# Patient Record
Sex: Male | Born: 1955
Health system: Southern US, Community
[De-identification: ages and names within clinical notes are randomized; demographics above are authoritative.]

## PROBLEM LIST (undated history)

## (undated) DIAGNOSIS — F3289 Other specified depressive episodes: Secondary | ICD-10-CM

## (undated) DIAGNOSIS — E119 Type 2 diabetes mellitus without complications: Secondary | ICD-10-CM

## (undated) DIAGNOSIS — E785 Hyperlipidemia, unspecified: Secondary | ICD-10-CM

## (undated) DIAGNOSIS — G7000 Myasthenia gravis without (acute) exacerbation: Secondary | ICD-10-CM

## (undated) DIAGNOSIS — K635 Polyp of colon: Secondary | ICD-10-CM

## (undated) DIAGNOSIS — G4700 Insomnia, unspecified: Secondary | ICD-10-CM

## (undated) DIAGNOSIS — D609 Acquired pure red cell aplasia, unspecified: Secondary | ICD-10-CM

## (undated) DIAGNOSIS — F329 Major depressive disorder, single episode, unspecified: Secondary | ICD-10-CM

## (undated) DIAGNOSIS — F4312 Post-traumatic stress disorder, chronic: Secondary | ICD-10-CM

## (undated) DIAGNOSIS — B181 Chronic viral hepatitis B without delta-agent: Secondary | ICD-10-CM

## (undated) DIAGNOSIS — K644 Residual hemorrhoidal skin tags: Secondary | ICD-10-CM

## (undated) DIAGNOSIS — L308 Other specified dermatitis: Secondary | ICD-10-CM

## (undated) DIAGNOSIS — K648 Other hemorrhoids: Secondary | ICD-10-CM

## (undated) DIAGNOSIS — F419 Anxiety disorder, unspecified: Secondary | ICD-10-CM

## (undated) HISTORY — DX: Hyperlipidemia, unspecified: E78.5

## (undated) HISTORY — DX: Type 2 diabetes mellitus without complications: E11.9

## (undated) HISTORY — DX: Acquired pure red cell aplasia, unspecified: D60.9

## (undated) HISTORY — DX: Anxiety disorder, unspecified: F41.9

## (undated) HISTORY — DX: Insomnia, unspecified: G47.00

## (undated) HISTORY — DX: Chronic viral hepatitis B without delta-agent: B18.1

## (undated) HISTORY — DX: Myasthenia gravis without (acute) exacerbation: G70.00

## (undated) HISTORY — DX: Other specified depressive episodes: F32.89

## (undated) HISTORY — DX: Other hemorrhoids: K64.8

## (undated) HISTORY — PX: THYMECTOMY: SHX1063

## (undated) HISTORY — DX: Polyp of colon: K63.5

## (undated) HISTORY — DX: Major depressive disorder, single episode, unspecified: F32.9

## (undated) HISTORY — DX: Residual hemorrhoidal skin tags: K64.4

---

## 1999-06-06 ENCOUNTER — Ambulatory Visit (HOSPITAL_COMMUNITY): Admission: EM | Admit: 1999-06-06 | Discharge: 1999-06-06 | Payer: Self-pay | Admitting: Emergency Medicine

## 2001-07-23 ENCOUNTER — Ambulatory Visit (HOSPITAL_COMMUNITY): Admission: RE | Admit: 2001-07-23 | Discharge: 2001-07-23 | Payer: Self-pay | Admitting: Endocrinology

## 2001-07-23 ENCOUNTER — Encounter: Payer: Self-pay | Admitting: Endocrinology

## 2001-10-29 ENCOUNTER — Ambulatory Visit (HOSPITAL_COMMUNITY): Admission: RE | Admit: 2001-10-29 | Discharge: 2001-10-29 | Payer: Self-pay | Admitting: Endocrinology

## 2001-10-29 ENCOUNTER — Encounter: Payer: Self-pay | Admitting: Endocrinology

## 2001-11-12 ENCOUNTER — Encounter (HOSPITAL_COMMUNITY): Admission: RE | Admit: 2001-11-12 | Discharge: 2001-12-29 | Payer: Self-pay | Admitting: Neurology

## 2001-11-15 ENCOUNTER — Encounter: Payer: Self-pay | Admitting: Neurology

## 2001-11-15 ENCOUNTER — Ambulatory Visit (HOSPITAL_COMMUNITY): Admission: RE | Admit: 2001-11-15 | Discharge: 2001-11-15 | Payer: Self-pay | Admitting: Neurology

## 2001-12-06 ENCOUNTER — Encounter: Payer: Self-pay | Admitting: Thoracic Surgery (Cardiothoracic Vascular Surgery)

## 2001-12-10 ENCOUNTER — Inpatient Hospital Stay (HOSPITAL_COMMUNITY)
Admission: RE | Admit: 2001-12-10 | Discharge: 2001-12-13 | Payer: Self-pay | Admitting: Thoracic Surgery (Cardiothoracic Vascular Surgery)

## 2001-12-10 ENCOUNTER — Encounter (INDEPENDENT_AMBULATORY_CARE_PROVIDER_SITE_OTHER): Payer: Self-pay | Admitting: Specialist

## 2001-12-11 ENCOUNTER — Encounter: Payer: Self-pay | Admitting: Thoracic Surgery (Cardiothoracic Vascular Surgery)

## 2001-12-12 ENCOUNTER — Encounter: Payer: Self-pay | Admitting: Thoracic Surgery (Cardiothoracic Vascular Surgery)

## 2001-12-17 ENCOUNTER — Ambulatory Visit: Admission: RE | Admit: 2001-12-17 | Discharge: 2002-02-28 | Payer: Self-pay | Admitting: Radiation Oncology

## 2002-01-01 ENCOUNTER — Encounter
Admission: RE | Admit: 2002-01-01 | Discharge: 2002-01-01 | Payer: Self-pay | Admitting: Thoracic Surgery (Cardiothoracic Vascular Surgery)

## 2002-01-01 ENCOUNTER — Encounter: Payer: Self-pay | Admitting: Thoracic Surgery (Cardiothoracic Vascular Surgery)

## 2002-01-30 HISTORY — PX: THYMECTOMY: SHX1063

## 2002-03-31 ENCOUNTER — Encounter
Admission: RE | Admit: 2002-03-31 | Discharge: 2002-03-31 | Payer: Self-pay | Admitting: Thoracic Surgery (Cardiothoracic Vascular Surgery)

## 2002-03-31 ENCOUNTER — Encounter: Payer: Self-pay | Admitting: Thoracic Surgery (Cardiothoracic Vascular Surgery)

## 2002-04-14 ENCOUNTER — Inpatient Hospital Stay (HOSPITAL_COMMUNITY): Admission: EM | Admit: 2002-04-14 | Discharge: 2002-04-15 | Payer: Self-pay | Admitting: Endocrinology

## 2002-06-03 ENCOUNTER — Encounter: Payer: Self-pay | Admitting: Thoracic Surgery (Cardiothoracic Vascular Surgery)

## 2002-06-03 ENCOUNTER — Encounter
Admission: RE | Admit: 2002-06-03 | Discharge: 2002-06-03 | Payer: Self-pay | Admitting: Thoracic Surgery (Cardiothoracic Vascular Surgery)

## 2002-09-08 ENCOUNTER — Encounter: Payer: Self-pay | Admitting: Thoracic Surgery (Cardiothoracic Vascular Surgery)

## 2002-09-08 ENCOUNTER — Encounter
Admission: RE | Admit: 2002-09-08 | Discharge: 2002-09-08 | Payer: Self-pay | Admitting: Thoracic Surgery (Cardiothoracic Vascular Surgery)

## 2003-03-17 ENCOUNTER — Encounter
Admission: RE | Admit: 2003-03-17 | Discharge: 2003-03-17 | Payer: Self-pay | Admitting: Thoracic Surgery (Cardiothoracic Vascular Surgery)

## 2003-03-26 ENCOUNTER — Ambulatory Visit: Admission: RE | Admit: 2003-03-26 | Discharge: 2003-03-26 | Payer: Self-pay | Admitting: Radiation Oncology

## 2003-09-16 ENCOUNTER — Encounter
Admission: RE | Admit: 2003-09-16 | Discharge: 2003-09-16 | Payer: Self-pay | Admitting: Thoracic Surgery (Cardiothoracic Vascular Surgery)

## 2004-02-18 ENCOUNTER — Ambulatory Visit: Payer: Self-pay | Admitting: Endocrinology

## 2004-04-07 ENCOUNTER — Ambulatory Visit: Admission: RE | Admit: 2004-04-07 | Discharge: 2004-04-07 | Payer: Self-pay | Admitting: Radiation Oncology

## 2004-04-21 ENCOUNTER — Ambulatory Visit: Admission: RE | Admit: 2004-04-21 | Discharge: 2004-04-21 | Payer: Self-pay | Admitting: Radiation Oncology

## 2004-06-23 ENCOUNTER — Ambulatory Visit: Payer: Self-pay | Admitting: Endocrinology

## 2004-06-29 ENCOUNTER — Ambulatory Visit: Payer: Self-pay | Admitting: Endocrinology

## 2004-07-27 ENCOUNTER — Ambulatory Visit: Payer: Self-pay | Admitting: Endocrinology

## 2004-10-13 ENCOUNTER — Encounter
Admission: RE | Admit: 2004-10-13 | Discharge: 2004-10-13 | Payer: Self-pay | Admitting: Thoracic Surgery (Cardiothoracic Vascular Surgery)

## 2004-10-20 ENCOUNTER — Ambulatory Visit: Payer: Self-pay | Admitting: Endocrinology

## 2004-10-27 ENCOUNTER — Ambulatory Visit: Payer: Self-pay | Admitting: Endocrinology

## 2005-01-06 ENCOUNTER — Ambulatory Visit: Payer: Self-pay | Admitting: Endocrinology

## 2005-02-02 ENCOUNTER — Ambulatory Visit: Payer: Self-pay | Admitting: Endocrinology

## 2005-03-06 ENCOUNTER — Ambulatory Visit: Payer: Self-pay | Admitting: Endocrinology

## 2005-03-20 ENCOUNTER — Ambulatory Visit: Payer: Self-pay | Admitting: Internal Medicine

## 2005-03-27 ENCOUNTER — Ambulatory Visit: Payer: Self-pay | Admitting: Endocrinology

## 2005-03-27 ENCOUNTER — Inpatient Hospital Stay (HOSPITAL_COMMUNITY): Admission: AD | Admit: 2005-03-27 | Discharge: 2005-04-01 | Payer: Self-pay | Admitting: Endocrinology

## 2005-03-28 ENCOUNTER — Ambulatory Visit: Payer: Self-pay | Admitting: Internal Medicine

## 2005-04-04 ENCOUNTER — Ambulatory Visit: Payer: Self-pay | Admitting: Endocrinology

## 2005-04-25 ENCOUNTER — Ambulatory Visit: Payer: Self-pay | Admitting: Endocrinology

## 2005-05-02 ENCOUNTER — Ambulatory Visit: Payer: Self-pay | Admitting: Endocrinology

## 2005-05-11 ENCOUNTER — Ambulatory Visit: Payer: Self-pay | Admitting: Cardiology

## 2005-06-13 ENCOUNTER — Ambulatory Visit: Payer: Self-pay | Admitting: Endocrinology

## 2005-07-12 ENCOUNTER — Ambulatory Visit: Payer: Self-pay | Admitting: Endocrinology

## 2005-07-26 ENCOUNTER — Ambulatory Visit: Payer: Self-pay | Admitting: Endocrinology

## 2005-08-30 ENCOUNTER — Ambulatory Visit: Payer: Self-pay | Admitting: Endocrinology

## 2005-10-17 ENCOUNTER — Ambulatory Visit: Payer: Self-pay | Admitting: Endocrinology

## 2005-10-26 ENCOUNTER — Ambulatory Visit: Payer: Self-pay | Admitting: Endocrinology

## 2005-11-21 ENCOUNTER — Encounter
Admission: RE | Admit: 2005-11-21 | Discharge: 2005-11-21 | Payer: Self-pay | Admitting: Thoracic Surgery (Cardiothoracic Vascular Surgery)

## 2005-11-23 ENCOUNTER — Ambulatory Visit: Payer: Self-pay | Admitting: Gastroenterology

## 2006-02-09 ENCOUNTER — Ambulatory Visit: Payer: Self-pay | Admitting: Endocrinology

## 2006-02-09 LAB — CONVERTED CEMR LAB: VLDL: 27 mg/dL (ref 0–40)

## 2006-02-13 ENCOUNTER — Ambulatory Visit: Payer: Self-pay | Admitting: Endocrinology

## 2006-02-22 ENCOUNTER — Ambulatory Visit: Payer: Self-pay | Admitting: Gastroenterology

## 2006-03-05 ENCOUNTER — Ambulatory Visit: Payer: Self-pay | Admitting: Endocrinology

## 2006-03-05 LAB — CONVERTED CEMR LAB
Eosinophils Relative: 4.1 % (ref 0.0–5.0)
HCT: 49.4 % (ref 39.0–52.0)
Hemoglobin: 17.2 g/dL — ABNORMAL HIGH (ref 13.0–17.0)
Lymphocytes Relative: 17.3 % (ref 12.0–46.0)
MCHC: 34.9 g/dL (ref 30.0–36.0)
MCV: 88.8 fL (ref 78.0–100.0)
Neutro Abs: 3.5 10*3/uL (ref 1.4–7.7)
Neutrophils Relative %: 69.3 % (ref 43.0–77.0)
Platelets: 164 10*3/uL (ref 150–400)
RBC: 5.57 M/uL (ref 4.22–5.81)
RDW: 12.5 % (ref 11.5–14.6)
WBC: 5 10*3/uL (ref 4.5–10.5)

## 2006-04-30 ENCOUNTER — Ambulatory Visit: Payer: Self-pay | Admitting: Cardiovascular Disease

## 2006-04-30 ENCOUNTER — Ambulatory Visit: Payer: Self-pay | Admitting: Endocrinology

## 2006-05-04 ENCOUNTER — Ambulatory Visit: Payer: Self-pay | Admitting: Endocrinology

## 2006-05-04 LAB — CONVERTED CEMR LAB
Alkaline Phosphatase: 51 units/L (ref 39–117)
Bilirubin, Direct: 0.2 mg/dL (ref 0.0–0.3)

## 2006-05-09 ENCOUNTER — Ambulatory Visit: Payer: Self-pay | Admitting: Internal Medicine

## 2006-05-09 LAB — CONVERTED CEMR LAB
HCV Ab: NEGATIVE
Hep A IgM: NEGATIVE
Hep B C IgM: NEGATIVE

## 2006-05-29 ENCOUNTER — Ambulatory Visit: Payer: Self-pay | Admitting: Endocrinology

## 2006-06-29 ENCOUNTER — Encounter: Payer: Self-pay | Admitting: Endocrinology

## 2006-06-29 DIAGNOSIS — B181 Chronic viral hepatitis B without delta-agent: Secondary | ICD-10-CM | POA: Insufficient documentation

## 2006-06-29 DIAGNOSIS — F339 Major depressive disorder, recurrent, unspecified: Secondary | ICD-10-CM | POA: Insufficient documentation

## 2006-06-29 DIAGNOSIS — E785 Hyperlipidemia, unspecified: Secondary | ICD-10-CM | POA: Insufficient documentation

## 2006-06-29 DIAGNOSIS — D609 Acquired pure red cell aplasia, unspecified: Secondary | ICD-10-CM | POA: Insufficient documentation

## 2006-06-29 DIAGNOSIS — E1169 Type 2 diabetes mellitus with other specified complication: Secondary | ICD-10-CM | POA: Insufficient documentation

## 2006-06-29 DIAGNOSIS — G4700 Insomnia, unspecified: Secondary | ICD-10-CM | POA: Insufficient documentation

## 2006-06-29 DIAGNOSIS — G7000 Myasthenia gravis without (acute) exacerbation: Secondary | ICD-10-CM | POA: Insufficient documentation

## 2006-06-29 DIAGNOSIS — E119 Type 2 diabetes mellitus without complications: Secondary | ICD-10-CM | POA: Insufficient documentation

## 2006-06-29 DIAGNOSIS — F324 Major depressive disorder, single episode, in partial remission: Secondary | ICD-10-CM | POA: Insufficient documentation

## 2006-07-03 ENCOUNTER — Ambulatory Visit: Payer: Self-pay | Admitting: Gastroenterology

## 2006-07-07 ENCOUNTER — Ambulatory Visit (HOSPITAL_COMMUNITY): Admission: RE | Admit: 2006-07-07 | Discharge: 2006-07-07 | Payer: Self-pay | Admitting: Gastroenterology

## 2006-07-25 ENCOUNTER — Ambulatory Visit: Payer: Self-pay | Admitting: Endocrinology

## 2006-09-24 ENCOUNTER — Ambulatory Visit: Payer: Self-pay | Admitting: Endocrinology

## 2006-09-24 LAB — CONVERTED CEMR LAB
Cholesterol: 190 mg/dL (ref 0–200)
HDL: 48.2 mg/dL (ref >39.0)
TSH: 0.86 microintl units/mL (ref 0.35–5.50)
Triglycerides: 233 mg/dL (ref 0–149)

## 2006-10-17 ENCOUNTER — Ambulatory Visit: Payer: Self-pay | Admitting: Endocrinology

## 2006-10-18 ENCOUNTER — Ambulatory Visit: Payer: Self-pay | Admitting: Gastroenterology

## 2006-12-14 ENCOUNTER — Ambulatory Visit: Payer: Self-pay | Admitting: Gastroenterology

## 2006-12-24 ENCOUNTER — Ambulatory Visit: Payer: Self-pay | Admitting: Endocrinology

## 2006-12-25 LAB — CONVERTED CEMR LAB: Hgb A1c MFr Bld: 6.3 % — ABNORMAL HIGH (ref 4.6–6.0)

## 2007-01-10 ENCOUNTER — Encounter: Payer: Self-pay | Admitting: Gastroenterology

## 2007-01-10 ENCOUNTER — Ambulatory Visit: Payer: Self-pay | Admitting: Gastroenterology

## 2007-01-17 ENCOUNTER — Telehealth (INDEPENDENT_AMBULATORY_CARE_PROVIDER_SITE_OTHER): Payer: Self-pay | Admitting: *Deleted

## 2007-01-18 ENCOUNTER — Ambulatory Visit: Payer: Self-pay | Admitting: Endocrinology

## 2007-01-27 ENCOUNTER — Emergency Department (HOSPITAL_COMMUNITY): Admission: EM | Admit: 2007-01-27 | Discharge: 2007-01-27 | Payer: Self-pay | Admitting: Emergency Medicine

## 2007-01-28 ENCOUNTER — Telehealth: Payer: Self-pay | Admitting: Endocrinology

## 2007-01-30 ENCOUNTER — Ambulatory Visit: Payer: Self-pay

## 2007-01-30 ENCOUNTER — Encounter: Payer: Self-pay | Admitting: Endocrinology

## 2007-02-06 ENCOUNTER — Encounter: Payer: Self-pay | Admitting: Endocrinology

## 2007-02-15 ENCOUNTER — Ambulatory Visit (HOSPITAL_COMMUNITY): Admission: RE | Admit: 2007-02-15 | Discharge: 2007-02-15 | Payer: Self-pay | Admitting: Gastroenterology

## 2007-03-14 DIAGNOSIS — K648 Other hemorrhoids: Secondary | ICD-10-CM | POA: Insufficient documentation

## 2007-03-14 DIAGNOSIS — K644 Residual hemorrhoidal skin tags: Secondary | ICD-10-CM | POA: Insufficient documentation

## 2007-03-14 DIAGNOSIS — D126 Benign neoplasm of colon, unspecified: Secondary | ICD-10-CM | POA: Insufficient documentation

## 2007-03-14 DIAGNOSIS — F411 Generalized anxiety disorder: Secondary | ICD-10-CM

## 2007-03-14 DIAGNOSIS — F419 Anxiety disorder, unspecified: Secondary | ICD-10-CM | POA: Insufficient documentation

## 2007-03-25 ENCOUNTER — Ambulatory Visit: Payer: Self-pay | Admitting: Endocrinology

## 2007-03-25 DIAGNOSIS — R21 Rash and other nonspecific skin eruption: Secondary | ICD-10-CM

## 2007-03-25 DIAGNOSIS — R000 Tachycardia, unspecified: Secondary | ICD-10-CM | POA: Insufficient documentation

## 2007-03-25 DIAGNOSIS — I498 Other specified cardiac arrhythmias: Secondary | ICD-10-CM

## 2007-03-25 DIAGNOSIS — Z8679 Personal history of other diseases of the circulatory system: Secondary | ICD-10-CM | POA: Insufficient documentation

## 2007-03-25 DIAGNOSIS — L282 Other prurigo: Secondary | ICD-10-CM | POA: Insufficient documentation

## 2007-04-02 ENCOUNTER — Encounter: Payer: Self-pay | Admitting: Endocrinology

## 2007-04-03 ENCOUNTER — Encounter: Payer: Self-pay | Admitting: Endocrinology

## 2007-05-06 ENCOUNTER — Encounter: Payer: Self-pay | Admitting: Endocrinology

## 2007-05-06 ENCOUNTER — Ambulatory Visit: Admission: RE | Admit: 2007-05-06 | Discharge: 2007-05-06 | Payer: Self-pay | Admitting: Radiation Oncology

## 2007-05-06 LAB — CREATININE, SERUM: Creatinine, Ser: 0.92 mg/dL (ref 0.40–1.50)

## 2007-05-08 ENCOUNTER — Ambulatory Visit (HOSPITAL_COMMUNITY): Admission: RE | Admit: 2007-05-08 | Discharge: 2007-05-08 | Payer: Self-pay | Admitting: Radiation Oncology

## 2007-05-20 ENCOUNTER — Ambulatory Visit: Payer: Self-pay | Admitting: Endocrinology

## 2007-06-06 ENCOUNTER — Ambulatory Visit: Payer: Self-pay | Admitting: Gastroenterology

## 2007-06-19 ENCOUNTER — Ambulatory Visit: Payer: Self-pay | Admitting: Endocrinology

## 2007-07-05 ENCOUNTER — Encounter: Payer: Self-pay | Admitting: Endocrinology

## 2007-09-05 ENCOUNTER — Encounter: Payer: Self-pay | Admitting: Endocrinology

## 2007-10-23 ENCOUNTER — Ambulatory Visit: Payer: Self-pay | Admitting: Endocrinology

## 2007-10-23 LAB — CONVERTED CEMR LAB
CO2: 33 meq/L — ABNORMAL HIGH (ref 19–32)
Calcium: 10.3 mg/dL (ref 8.4–10.5)
Chloride: 108 meq/L (ref 96–112)
Creatinine, Ser: 1 mg/dL (ref 0.4–1.5)
GFR calc non Af Amer: 84 mL/min
Microalb Creat Ratio: 6.9 mg/g (ref 0.0–30.0)
Sodium: 147 meq/L — ABNORMAL HIGH (ref 135–145)
TSH: 0.7 microintl units/mL (ref 0.35–5.50)
Total CHOL/HDL Ratio: 4.5
Triglycerides: 412 mg/dL (ref 0–149)

## 2007-10-25 ENCOUNTER — Ambulatory Visit: Payer: Self-pay | Admitting: Internal Medicine

## 2007-10-25 ENCOUNTER — Encounter: Payer: Self-pay | Admitting: Endocrinology

## 2007-10-29 ENCOUNTER — Encounter: Payer: Self-pay | Admitting: Endocrinology

## 2007-11-25 ENCOUNTER — Ambulatory Visit: Payer: Self-pay | Admitting: Endocrinology

## 2007-11-25 DIAGNOSIS — H66009 Acute suppurative otitis media without spontaneous rupture of ear drum, unspecified ear: Secondary | ICD-10-CM | POA: Insufficient documentation

## 2007-11-25 DIAGNOSIS — M810 Age-related osteoporosis without current pathological fracture: Secondary | ICD-10-CM | POA: Insufficient documentation

## 2007-11-28 ENCOUNTER — Encounter: Payer: Self-pay | Admitting: Endocrinology

## 2007-11-28 ENCOUNTER — Ambulatory Visit: Payer: Self-pay | Admitting: Gastroenterology

## 2007-11-28 LAB — CONVERTED CEMR LAB
Beta Globulin: 5.9 % (ref 4.7–7.2)
Calcium, Total (PTH): 9.8 mg/dL (ref 8.4–10.5)
PTH: 25.2 pg/mL (ref 14.0–72.0)

## 2007-12-02 ENCOUNTER — Ambulatory Visit: Payer: Self-pay | Admitting: Endocrinology

## 2007-12-19 ENCOUNTER — Encounter (INDEPENDENT_AMBULATORY_CARE_PROVIDER_SITE_OTHER): Payer: Self-pay | Admitting: *Deleted

## 2008-02-18 ENCOUNTER — Encounter: Payer: Self-pay | Admitting: Endocrinology

## 2008-02-24 ENCOUNTER — Ambulatory Visit: Payer: Self-pay | Admitting: Endocrinology

## 2008-02-24 DIAGNOSIS — E7800 Pure hypercholesterolemia, unspecified: Secondary | ICD-10-CM | POA: Insufficient documentation

## 2008-02-24 LAB — CONVERTED CEMR LAB
Cholesterol: 156 mg/dL (ref 0–200)
HDL: 43.5 mg/dL (ref >39.0)
Hgb A1c MFr Bld: 5.8 % (ref 4.6–6.0)
Total CHOL/HDL Ratio: 3.6
VLDL: 62 mg/dL — ABNORMAL HIGH (ref 0–40)

## 2008-05-12 ENCOUNTER — Ambulatory Visit: Payer: Self-pay | Admitting: Endocrinology

## 2008-05-12 ENCOUNTER — Ambulatory Visit: Payer: Self-pay | Admitting: Gastroenterology

## 2008-05-12 DIAGNOSIS — L219 Seborrheic dermatitis, unspecified: Secondary | ICD-10-CM | POA: Insufficient documentation

## 2008-05-13 ENCOUNTER — Telehealth (INDEPENDENT_AMBULATORY_CARE_PROVIDER_SITE_OTHER): Payer: Self-pay | Admitting: *Deleted

## 2008-06-08 ENCOUNTER — Ambulatory Visit (HOSPITAL_COMMUNITY): Admission: RE | Admit: 2008-06-08 | Discharge: 2008-06-08 | Payer: Self-pay | Admitting: Gastroenterology

## 2008-07-23 ENCOUNTER — Ambulatory Visit: Payer: Self-pay | Admitting: Endocrinology

## 2008-07-23 DIAGNOSIS — K219 Gastro-esophageal reflux disease without esophagitis: Secondary | ICD-10-CM | POA: Insufficient documentation

## 2008-07-23 LAB — CONVERTED CEMR LAB
Basophils Relative: 0 % (ref 0.0–3.0)
Eosinophils Relative: 0.5 % (ref 0.0–5.0)
HCT: 46.6 % (ref 39.0–52.0)
HDL: 52.6 mg/dL (ref >39.00)
Hemoglobin: 16.3 g/dL (ref 13.0–17.0)
Hgb A1c MFr Bld: 5.9 % (ref 4.6–6.5)
MCHC: 35 g/dL (ref 30.0–36.0)
Monocytes Absolute: 0.2 10*3/uL (ref 0.1–1.0)
Neutrophils Relative %: 83.2 % — ABNORMAL HIGH (ref 43.0–77.0)
RBC: 5.28 M/uL (ref 4.22–5.81)
VLDL: 31.6 mg/dL (ref 0.0–40.0)

## 2008-09-30 ENCOUNTER — Encounter: Payer: Self-pay | Admitting: Endocrinology

## 2008-10-13 ENCOUNTER — Ambulatory Visit: Payer: Self-pay | Admitting: Gastroenterology

## 2008-10-13 ENCOUNTER — Encounter: Payer: Self-pay | Admitting: Endocrinology

## 2008-11-23 ENCOUNTER — Ambulatory Visit: Payer: Self-pay | Admitting: Endocrinology

## 2008-11-23 DIAGNOSIS — R519 Headache, unspecified: Secondary | ICD-10-CM | POA: Insufficient documentation

## 2008-11-23 DIAGNOSIS — R51 Headache: Secondary | ICD-10-CM

## 2008-11-23 DIAGNOSIS — E559 Vitamin D deficiency, unspecified: Secondary | ICD-10-CM | POA: Insufficient documentation

## 2008-11-23 LAB — CONVERTED CEMR LAB: Hgb A1c MFr Bld: 5.7 % (ref 4.6–6.5)

## 2008-11-25 LAB — CONVERTED CEMR LAB: Calcium, Total (PTH): 10.3 mg/dL (ref 8.4–10.5)

## 2008-11-30 ENCOUNTER — Ambulatory Visit: Payer: Self-pay | Admitting: Endocrinology

## 2008-11-30 ENCOUNTER — Ambulatory Visit: Payer: Self-pay | Admitting: Cardiology

## 2008-11-30 LAB — CONVERTED CEMR LAB
HCT: 49.1 % (ref 39.0–52.0)
Hemoglobin: 17.3 g/dL — ABNORMAL HIGH (ref 13.0–17.0)
Monocytes Relative: 2 % — ABNORMAL LOW (ref 3.0–12.0)
Platelets: 175 10*3/uL (ref 150.0–400.0)
RBC: 5.47 M/uL (ref 4.22–5.81)
RDW: 12 % (ref 11.5–14.6)
WBC: 9.4 10*3/uL (ref 4.5–10.5)

## 2009-01-28 ENCOUNTER — Ambulatory Visit: Payer: Self-pay | Admitting: Endocrinology

## 2009-01-28 DIAGNOSIS — J069 Acute upper respiratory infection, unspecified: Secondary | ICD-10-CM | POA: Insufficient documentation

## 2009-02-04 ENCOUNTER — Telehealth: Payer: Self-pay | Admitting: Endocrinology

## 2009-04-29 ENCOUNTER — Ambulatory Visit: Payer: Self-pay | Admitting: Gastroenterology

## 2009-04-29 ENCOUNTER — Encounter: Payer: Self-pay | Admitting: Endocrinology

## 2009-06-11 ENCOUNTER — Ambulatory Visit: Payer: Self-pay | Admitting: Endocrinology

## 2009-06-11 LAB — CONVERTED CEMR LAB
ALT: 30 units/L (ref 0–53)
BUN: 16 mg/dL (ref 6–23)
Basophils Absolute: 0 10*3/uL (ref 0.0–0.1)
Basophils Relative: 0.1 % (ref 0.0–3.0)
Bilirubin Urine: NEGATIVE
CO2: 29 meq/L (ref 19–32)
Cholesterol: 179 mg/dL (ref 0–200)
Creatinine, Ser: 0.9 mg/dL (ref 0.4–1.5)
Eosinophils Relative: 0.1 % (ref 0.0–5.0)
Glucose, Bld: 181 mg/dL — ABNORMAL HIGH (ref 70–99)
HDL: 49.4 mg/dL (ref >39.00)
Hemoglobin: 16.9 g/dL (ref 13.0–17.0)
Leukocytes, UA: NEGATIVE
Lymphs Abs: 0.8 10*3/uL (ref 0.7–4.0)
MCHC: 35.4 g/dL (ref 30.0–36.0)
MCV: 88.8 fL (ref 78.0–100.0)
Monocytes Absolute: 0.2 10*3/uL (ref 0.1–1.0)
PSA: 0.72 ng/mL (ref 0.10–4.00)
Potassium: 4.3 meq/L (ref 3.5–5.1)
Sodium: 142 meq/L (ref 135–145)
TSH: 0.3 microintl units/mL — ABNORMAL LOW (ref 0.35–5.50)
Total Bilirubin: 0.9 mg/dL (ref 0.3–1.2)
Total CHOL/HDL Ratio: 4
Total Protein, Urine: NEGATIVE mg/dL
Total Protein: 7.2 g/dL (ref 6.0–8.3)
Triglycerides: 281 mg/dL — ABNORMAL HIGH (ref 0.0–149.0)
Urine Glucose: 500 mg/dL
Urobilinogen, UA: 0.2 (ref 0.0–1.0)
VLDL: 56.2 mg/dL — ABNORMAL HIGH (ref 0.0–40.0)
Vit D, 25-Hydroxy: 50 ng/mL (ref 30–89)
WBC: 7.4 10*3/uL (ref 4.5–10.5)

## 2009-07-07 ENCOUNTER — Ambulatory Visit: Payer: Self-pay | Admitting: Endocrinology

## 2009-07-07 ENCOUNTER — Telehealth: Payer: Self-pay | Admitting: Endocrinology

## 2009-07-07 DIAGNOSIS — M25519 Pain in unspecified shoulder: Secondary | ICD-10-CM | POA: Insufficient documentation

## 2009-07-13 ENCOUNTER — Encounter: Payer: Self-pay | Admitting: Endocrinology

## 2009-07-14 ENCOUNTER — Encounter: Payer: Self-pay | Admitting: Endocrinology

## 2009-07-15 ENCOUNTER — Ambulatory Visit: Payer: Self-pay | Admitting: Gastroenterology

## 2009-07-15 ENCOUNTER — Encounter: Payer: Self-pay | Admitting: Endocrinology

## 2009-07-22 ENCOUNTER — Ambulatory Visit (HOSPITAL_COMMUNITY): Admission: RE | Admit: 2009-07-22 | Discharge: 2009-07-22 | Payer: Self-pay | Admitting: Gastroenterology

## 2009-08-19 ENCOUNTER — Telehealth (INDEPENDENT_AMBULATORY_CARE_PROVIDER_SITE_OTHER): Payer: Self-pay | Admitting: *Deleted

## 2009-08-20 ENCOUNTER — Encounter: Payer: Self-pay | Admitting: Endocrinology

## 2009-08-20 ENCOUNTER — Telehealth: Payer: Self-pay | Admitting: Endocrinology

## 2009-09-06 ENCOUNTER — Ambulatory Visit: Payer: Self-pay | Admitting: Endocrinology

## 2009-09-06 DIAGNOSIS — H9190 Unspecified hearing loss, unspecified ear: Secondary | ICD-10-CM | POA: Insufficient documentation

## 2009-09-27 ENCOUNTER — Telehealth (INDEPENDENT_AMBULATORY_CARE_PROVIDER_SITE_OTHER): Payer: Self-pay | Admitting: *Deleted

## 2009-10-05 ENCOUNTER — Ambulatory Visit: Payer: Self-pay | Admitting: Endocrinology

## 2009-10-05 DIAGNOSIS — E079 Disorder of thyroid, unspecified: Secondary | ICD-10-CM | POA: Insufficient documentation

## 2009-10-05 LAB — CONVERTED CEMR LAB: Hgb A1c MFr Bld: 6 % (ref 4.6–6.5)

## 2009-10-14 ENCOUNTER — Ambulatory Visit: Payer: Self-pay | Admitting: Gastroenterology

## 2009-10-21 ENCOUNTER — Encounter: Payer: Self-pay | Admitting: Endocrinology

## 2009-12-02 ENCOUNTER — Encounter: Payer: Self-pay | Admitting: Endocrinology

## 2009-12-09 ENCOUNTER — Ambulatory Visit: Payer: Self-pay | Admitting: Endocrinology

## 2009-12-27 ENCOUNTER — Telehealth: Payer: Self-pay | Admitting: Endocrinology

## 2010-01-13 ENCOUNTER — Ambulatory Visit: Payer: Self-pay | Admitting: Gastroenterology

## 2010-01-13 ENCOUNTER — Ambulatory Visit (HOSPITAL_COMMUNITY)
Admission: RE | Admit: 2010-01-13 | Discharge: 2010-01-13 | Payer: Self-pay | Source: Home / Self Care | Attending: Gastroenterology | Admitting: Gastroenterology

## 2010-01-13 ENCOUNTER — Encounter: Payer: Self-pay | Admitting: Endocrinology

## 2010-02-17 ENCOUNTER — Ambulatory Visit
Admission: RE | Admit: 2010-02-17 | Discharge: 2010-02-17 | Payer: Self-pay | Source: Home / Self Care | Attending: Endocrinology | Admitting: Endocrinology

## 2010-02-17 ENCOUNTER — Other Ambulatory Visit: Payer: Self-pay | Admitting: Endocrinology

## 2010-02-17 DIAGNOSIS — I808 Phlebitis and thrombophlebitis of other sites: Secondary | ICD-10-CM | POA: Insufficient documentation

## 2010-02-17 LAB — HEMOGLOBIN A1C: Hgb A1c MFr Bld: 6.2 % (ref 4.6–6.5)

## 2010-02-20 ENCOUNTER — Encounter: Payer: Self-pay | Admitting: Gastroenterology

## 2010-03-01 NOTE — Assessment & Plan Note (Signed)
Summary: ear and headache---stc   Vital Signs:  Patient profile:   55 year old male Height:      65 inches (165.10 cm) Weight:      131.13 pounds (59.60 kg) BMI:     21.90 O2 Sat:      94 % on Room air Temp:     99.2 degrees F (37.33 degrees C) oral Pulse rate:   112 / minute BP sitting:   140 / 118  (left arm) Cuff size:   regular  Vitals Entered By: Josph Macho RMA (Jun 11, 2009 1:02 PM)  O2 Flow:  Room air CC: Headache and ears hurting X3-4 days/ CF Is Patient Diabetic? Yes   CC:  Headache and ears hurting X3-4 days/ CF.  History of Present Illness: pt states few days of slight pain at both ears, but no associated nasal congestion.   no cbg record, but states cbg's are well-controlled.  he has dry mouth.   Current Medications (verified): 1)  Metformin Hcl 500 Mg Tabs (Metformin Hcl) .... 2-Bid 2)  Onetouch Ultra Test  Strp (Glucose Blood) .... Use As Directed 3)  Onetouch Ultrasoft Lancets  Misc (Lancets) .... Use As Directed 4)  Baraclude 1 Mg  Tabs (Entecavir) .... Take 1 By Mouth Qd 5)  Prednisone 5 Mg  Tabs (Prednisone) .... Take Two Times A Day 6)  Fluoxetine Hcl 20 Mg  Tabs (Fluoxetine Hcl) .... Take 1 By Mouth Qd 7)  Zolpidem Tartrate 10 Mg  Tabs (Zolpidem Tartrate) .... Take 1 By Mouth At Bedtime Prn 8)  Colestipol Hcl 1 Gm Tabs (Colestipol Hcl) .... 5 Qd 9)  Alendronate Sodium 70 Mg Tabs (Alendronate Sodium) .... Q Week 10)  Vitamin D 2000 Unit Tabs (Cholecalciferol) .... Take 1 By Mouth Qd 11)  Pyridostigmine Bromide 60 Mg Tabs (Pyridostigmine Bromide) .Marland Kitchen.. 1 Tab Qd 12)  Omeprazole 20 Mg Cpdr (Omeprazole) .Marland Kitchen.. 1 Qd 13)  Calcium 600 Mg Tabs (Calcium) .... 3 Times Daily 14)  Ondansetron 4 Mg Tbdp (Ondansetron) .Marland Kitchen.. 1 Q4h As Needed Nausea 15)  Hydrocortisone 2.5 % Crea (Hydrocortisone) .... Qid As Needed Rash 16)  Promethazine-Codeine 6.25-10 Mg/26ml Syrp (Promethazine-Codeine) .Marland Kitchen.. 1 Tsp Q4h As Needed Cough 17)  Doxycycline Hyclate 100 Mg Caps (Doxycycline  Hyclate) .Marland Kitchen.. 1 Bid 18)  Lidex 0.05 % Crea (Fluocinonide) .... Apply Three Times A Day As Needed Rash  Allergies (verified): 1)  ! * D-Penicillamine 2)  ! * Curare 3)  ! Beta Blockers 4)  ! * Magnesium Salts 5)  ! Azithromycin (Azithromycin)  Past History:  Past Medical History: Last updated: 03/14/2007 RED CELL APLASIA, ACQUIRED, ADULT, WITH THYMO (ICD-284.81) INSOMNIA (ICD-780.52) MYASTHENIA GRAVIS W/O (ACUTE) EXACERBATION (ICD-358.00) HEPATITIS B, CHRONIC (ICD-070.32) DYSLIPIDEMIA (ICD-272.4) DIABETES MELLITUS, TYPE II (ICD-250.00) DEPRESSION (ICD-311) ANXIETY INTERNAL HEMORRHOIDS EXTERNAL HEMORRHOIDS COLON POLYPS  Review of Systems       The patient complains of headaches.  The patient denies fever.    Physical Exam  General:  normal appearance.   Head:  head: no deformity eyes: no periorbital swelling, no proptosis external nose and ears are normal mouth: no lesion seen, but there is slight erythema of the pahrynx. Ears:  both tm's are slightly red Additional Exam:  FastTSH              [L]  0.30 uIU/mL   Hemoglobin A1C            5.6    Impression & Recommendations:  Problem # 1:  DIABETES  MELLITUS, TYPE II (ICD-250.00) well-controlled  Problem # 2:  URI (ICD-465.9) recurrent  Problem # 3:  abnormal tsh new uncertain etiology  Medications Added to Medication List This Visit: 1)  Doxycycline Hyclate 100 Mg Caps (Doxycycline hyclate) .Marland Kitchen.. 1 two times a day  Other Orders: T-Vitamin D (25-Hydroxy) (04540-98119) TLB-Lipid Panel (80061-LIPID) TLB-BMP (Basic Metabolic Panel-BMET) (80048-METABOL) TLB-CBC Platelet - w/Differential (85025-CBCD) TLB-Hepatic/Liver Function Pnl (80076-HEPATIC) TLB-TSH (Thyroid Stimulating Hormone) (84443-TSH) TLB-A1C / Hgb A1C (Glycohemoglobin) (83036-A1C) TLB-Microalbumin/Creat Ratio, Urine (82043-MALB) TLB-Udip w/ Micro (81001-URINE) TLB-PSA (Prostate Specific Antigen) (84153-PSA) Prescription Created Electronically  (J4782) Est. Patient Level IV (95621)  Patient Instructions: 1)  Please schedule a "medicare wellness" appointment in 6 months. 2)  tests are being ordered for you today.  a few days after the test(s), please call [phone] to hear your test results. 3)  doxycycline 100 mg two times a day 4)  (update: i left message on phone-tree:  rx as we discussed.  we'll follow the tsh.) Prescriptions: DOXYCYCLINE HYCLATE 100 MG CAPS (DOXYCYCLINE HYCLATE) 1 two times a day  #14 x 0   Entered and Authorized by:   Minus Breeding MD   Signed by:   Minus Breeding MD on 06/11/2009   Method used:   Electronically to        [facility]  [phone]* (retail)       [address] [place], [place]  [address]       Ph: [phone] or [phone]       Fax: [phone]   RxID:   [id number]

## 2010-03-01 NOTE — Letter (Signed)
Summary: [facility]   Imported By: Sherian Rein 08/04/2009 10:07:28  _____________________________________________________________________  External Attachment:    Type:   Image     Comment:   External Document

## 2010-03-01 NOTE — Progress Notes (Signed)
Summary: Med clarification  Phone Note From Pharmacy   Summary of Call: received call from pharmacy that Clotrimazole does not come in 100mg . The lozenges only come in 10mg . Pharmacy is aware it is ok to dispence the 10mg  to patient. Initial call taken by: Lucious Groves,  July 07, 2009 11:09 AM  Follow-up for Phone Call        i resent rx Follow-up by: Minus Breeding MD,  July 07, 2009 12:55 PM    New/Updated Medications: CLOTRIMAZOLE 10 MG LOZG (CLOTRIMAZOLE) 1 in the mouth 4x a day Prescriptions: CLOTRIMAZOLE 10 MG LOZG (CLOTRIMAZOLE) 1 in the mouth 4x a day  #28 x 0   Entered and Authorized by:   Minus Breeding MD   Signed by:   Minus Breeding MD on 07/07/2009   Method used:   Electronically to        [facility]  [phone]* (retail)       [address] [place], [place]  [address]       Ph: [phone] or [phone]       Fax: [phone]   RxID:   [id number]

## 2010-03-01 NOTE — Progress Notes (Signed)
Summary: rx refill req  Phone Note Refill Request Message from:  Pharmacy on December 27, 2009 10:38 AM  Refills Requested: Medication #1:  HYDROCORTISONE 2.5 % CREA Apply to affected area four times a day for rash as needed.   Dosage confirmed as above?Dosage Confirmed  Method Requested: Electronic Next Appointment Scheduled: none Initial call taken by: Brenton Grills CMA Duncan Dull),  December 27, 2009 10:38 AM    Prescriptions: HYDROCORTISONE 2.5 % CREA (HYDROCORTISONE) Apply to affected area four times a day for rash as needed  #1 month x 2   Entered by:   Brenton Grills CMA (AAMA)   Authorized by:   Minus Breeding MD   Signed by:   Brenton Grills CMA (AAMA) on 12/27/2009   Method used:   Electronically to        [facility]  [phone]* (retail)       [address] [place], [place]  [address]       Ph: [phone] or [phone]       Fax: [phone]   RxID:   [id number]

## 2010-03-01 NOTE — Letter (Signed)
[facility]   Imported By: Lester Shelley 12/21/2009 10:14:39  _____________________________________________________________________  External Attachment:    Type:   Image     Comment:   External Document

## 2010-03-01 NOTE — Consult Note (Signed)
Summary: [facility]   [facility]   Imported By: Sherian Rein 10/21/2009 11:05:32  _____________________________________________________________________  External Attachment:    Type:   Image     Comment:   External Document

## 2010-03-01 NOTE — Letter (Signed)
Summary: [facility]  [facility]   Imported By: Lester Unicoi 08/05/2009 10:19:24  _____________________________________________________________________  External Attachment:    Type:   Image     Comment:   External Document

## 2010-03-01 NOTE — Assessment & Plan Note (Signed)
Summary: 3 MO ROV /NWS   Vital Signs:  Patient profile:   55 year old male Height:      65 inches (165.10 cm) Weight:      135.50 pounds (61.59 kg) BMI:     22.63 O2 Sat:      96 % on Room air Temp:     98.7 degrees F (37.06 degrees C) oral Pulse rate:   99 / minute BP sitting:   122 / 86  (left arm) Cuff size:   regular  Vitals Entered By: Brenton Grills CMA (AAMA) (December 09, 2009 9:30 AM)  O2 Flow:  Room air CC: Follow-up visit/pt c/o congestion, HA, soreness in neck and shoulders x 2 weeks/aj Comments pt is no longer taking Amoxicillin/Pt declined flu shot   CC:  Follow-up visit/pt c/o congestion, HA, and soreness in neck and shoulders x 2 weeks/aj.  History of Present Illness: pt states 2 weeks of slight headache rad to the neck and both shoulders.  there is assoc nasal congestion.   he says ent dr was unable to say why he gets these illnesses.  Current Medications (verified): 1)  Metformin Hcl 500 Mg Tabs (Metformin Hcl) .... 2-Bid 2)  Onetouch Ultra Test  Strp (Glucose Blood) .... Use As Directed 3)  Onetouch Ultrasoft Lancets  Misc (Lancets) .... Use As Directed 4)  Baraclude 1 Mg  Tabs (Entecavir) .... Take 1 By Mouth Qd 5)  Prednisone 5 Mg  Tabs (Prednisone) .... Take Two Times A Day 6)  Fluoxetine Hcl 20 Mg  Tabs (Fluoxetine Hcl) .... Take 1 By Mouth Qd 7)  Zolpidem Tartrate 10 Mg  Tabs (Zolpidem Tartrate) .... Take 1 By Mouth At Bedtime Prn 8)  Colestipol Hcl 1 Gm Tabs (Colestipol Hcl) .... 5 Qd 9)  Alendronate Sodium 70 Mg Tabs (Alendronate Sodium) .... Q Week 10)  Vitamin D 2000 Unit Tabs (Cholecalciferol) .... Take 1 By Mouth Qd 11)  Pyridostigmine Bromide 60 Mg Tabs (Pyridostigmine Bromide) .Marland Kitchen.. 1 Tab Qd 12)  Omeprazole 20 Mg Cpdr (Omeprazole) .Marland Kitchen.. 1 Qd 13)  Calcium 600 Mg Tabs (Calcium) .... 3 Times Daily 14)  Amoxicillin 500 Mg Caps (Amoxicillin) .Marland Kitchen.. 1 Tab Three Times A Day 15)  Tylenol Extra Strength 500 Mg Tabs (Acetaminophen) .Marland Kitchen.. 1-2 Tablets As  Needed  Allergies (verified): 1)  ! * D-Penicillamine 2)  ! * Curare 3)  ! Beta Blockers 4)  ! * Magnesium Salts 5)  ! Azithromycin (Azithromycin)  Past History:  Past Medical History: Last updated: 03/14/2007 RED CELL APLASIA, ACQUIRED, ADULT, WITH THYMO (ICD-284.81) INSOMNIA (ICD-780.52) MYASTHENIA GRAVIS W/O (ACUTE) EXACERBATION (ICD-358.00) HEPATITIS B, CHRONIC (ICD-070.32) DYSLIPIDEMIA (ICD-272.4) DIABETES MELLITUS, TYPE II (ICD-250.00) DEPRESSION (ICD-311) ANXIETY INTERNAL HEMORRHOIDS EXTERNAL HEMORRHOIDS COLON POLYPS  Review of Systems  The patient denies fever.         he has bilat earache.  no cough.  Physical Exam  General:  normal appearance.   Head:  head: no deformity eyes: no periorbital swelling, no proptosis external nose and ears are normal mouth: no lesion seen Ears:  TM's intact and clear with normal canals with grossly normal hearing.   Neck:  full rom without pain Msk:  upper trapezius areas are nontender   Impression & Recommendations:  Problem # 1:  URI (ICD-465.9) recurrent  Medications Added to Medication List This Visit: 1)  Metformin Hcl 500 Mg Tabs (Metformin hcl) .... 2 tabs two times a day 2)  Tylenol Extra Strength 500 Mg Tabs (Acetaminophen) .Marland Kitchen.. 1-2  tablets as needed 3)  Doxycycline Hyclate 100 Mg Caps (Doxycycline hyclate) .Marland Kitchen.. 1 tab two times a day  Other Orders: Est. Patient Level III ([address])  Patient Instructions: 1)  you should consider seeing an allergy specialist to see why you have so many of these illnesses. 2)  doxycycline 100 mg two times a day. 3)  loratadine-d as needed for congestion.   Prescriptions: DOXYCYCLINE HYCLATE 100 MG CAPS (DOXYCYCLINE HYCLATE) 1 tab two times a day  #14 x 0   Entered and Authorized by:   Minus Breeding MD   Signed by:   Minus Breeding MD on 12/09/2009   Method used:   Electron[facility] to        [facility]  [phone]* (retail)       [address] [place],  [place]  [address]       Ph: [phone] or [phone]       Fax: [phone]   RxID:   [id number]    Orders Added: 1)  Est. Patient Level III [03474]

## 2010-03-01 NOTE — Progress Notes (Signed)
Summary: Allergy  Phone Note Call from Patient Call back at Home Phone [phone]   Caller: Patient Summary of Call: Pt called stating that his pharmacy has been trying to get in contact with office to inform that pt is Allergic to Azithromycin. He is calling himself now because pharmacy has received no response. Pt is requesting alt medication, please advise Initial call taken by: Margaret Pyle, CMA,  February 04, 2009 4:27 PM  Follow-up for Phone Call        i sent rx for doxycycline.  please tell us what side-effect ypu had with azithromycin, so we can add to chart Follow-up by: Minus Breeding MD,  February 04, 2009 4:43 PM  Additional Follow-up for Phone Call Additional follow up Details #1::        pt states that ABX aggrevate his Myasthenia Gravis, causing his eyes to droop severely. Pt also wanted to request an additional cream for rash. Rx'd cream works well everywhere but on stomach. pt says Rx can be sent to same pharm. please advise. Additional Follow-up by: Margaret Pyle, CMA,  February 04, 2009 4:53 PM   New Allergies: ! AZITHROMYCIN (AZITHROMYCIN) Additional Follow-up for Phone Call Additional follow up Details #2::    i sent rx Follow-up by: Minus Breeding MD,  February 04, 2009 5:05 PM  Additional Follow-up for Phone Call Additional follow up Details #3:: Details for Additional Follow-up Action Taken: Patient notified. Additional Follow-up by: Lucious Groves,  February 05, 2009 8:58 AM  New/Updated Medications: DOXYCYCLINE HYCLATE 100 MG CAPS (DOXYCYCLINE HYCLATE) 1 bid LIDEX 0.05 % CREA (FLUOCINONIDE) apply three times a day as needed rash New Allergies: ! AZITHROMYCIN (AZITHROMYCIN)Prescriptions: LIDEX 0.05 % CREA (FLUOCINONIDE) apply three times a day as needed rash  #1 med tube x 1   Entered and Authorized by:   Minus Breeding MD   Signed by:   Minus Breeding MD on 02/04/2009   Method used:   Electronically to        [facility]   [phone]* (retail)       [address] [place], [place]  [address]       Ph: [phone] or [phone]       Fax: [phone]   RxID:   [id number] DOXYCYCLINE HYCLATE 100 MG CAPS (DOXYCYCLINE HYCLATE) 1 bid  #14 x 0   Entered and Authorized by:   Minus Breeding MD   Signed by:   Minus Breeding MD on 02/04/2009   Method used:   Electronically to        [facility]  [phone]* (retail)       [address] [place], [place]  [address]       Ph: [phone] or [phone]       Fax: [phone]   RxID:   [id number]

## 2010-03-01 NOTE — Letter (Signed)
Summary: Generic Letter  [facility]  [address] [place], [place] [address]   Phone: [phone]  Fax: [phone]    08/20/2009  CALOGERO GEISEN [address] [place], [place]  [address]  Dear Mr. PREMO,  This is to say that you have a current diagnosis of myasthenia gravis.  You are being treated by a neurologist at [facility] for this.    Sincerely,   Romero Belling MD

## 2010-03-01 NOTE — Assessment & Plan Note (Signed)
Summary: SHOULDER PAIN, DRY MOUTH-OYU   Vital Signs:  Patient profile:   55 year old male Height:      65 inches (165.10 cm) Weight:      132.0 pounds (60.00 kg) O2 Sat:      97 % on Room air Temp:     97.2 degrees F (36.22 degrees C) oral Pulse rate:   101 / minute BP sitting:   120 / 82  (left arm) Cuff size:   regular  Vitals Entered By: Orlan Leavens (July 07, 2009 9:18 AM)  O2 Flow:  Room air CC: (R) shoulder pain & dry mouth. Also want refill on fluoxetine Is Patient Diabetic? Yes Did you bring your meter with you today? No Pain Assessment Patient in pain? yes     Location: (R) shoulder Type: aching   CC:  (R) shoulder pain & dry mouth. Also want refill on fluoxetine.  History of Present Illness: pt states few mos of slight "white coating" on the tongue.  no assoc pain.   pt states few mos of pain at right upper trapezius area.  no injury.  Current Medications (verified): 1)  Metformin Hcl 500 Mg Tabs (Metformin Hcl) .... 2-Bid 2)  Onetouch Ultra Test  Strp (Glucose Blood) .... Use As Directed 3)  Onetouch Ultrasoft Lancets  Misc (Lancets) .... Use As Directed 4)  Baraclude 1 Mg  Tabs (Entecavir) .... Take 1 By Mouth Qd 5)  Prednisone 5 Mg  Tabs (Prednisone) .... Take Two Times A Day 6)  Fluoxetine Hcl 20 Mg  Tabs (Fluoxetine Hcl) .... Take 1 By Mouth Qd 7)  Zolpidem Tartrate 10 Mg  Tabs (Zolpidem Tartrate) .... Take 1 By Mouth At Bedtime Prn 8)  Colestipol Hcl 1 Gm Tabs (Colestipol Hcl) .... 5 Qd 9)  Alendronate Sodium 70 Mg Tabs (Alendronate Sodium) .... Q Week 10)  Vitamin D 2000 Unit Tabs (Cholecalciferol) .... Take 1 By Mouth Qd 11)  Pyridostigmine Bromide 60 Mg Tabs (Pyridostigmine Bromide) .Marland Kitchen.. 1 Tab Qd 12)  Omeprazole 20 Mg Cpdr (Omeprazole) .Marland Kitchen.. 1 Qd 13)  Calcium 600 Mg Tabs (Calcium) .... 3 Times Daily  Allergies (verified): 1)  ! * D-Penicillamine 2)  ! * Curare 3)  ! Beta Blockers 4)  ! * Magnesium Salts 5)  ! Azithromycin (Azithromycin)  Past  History:  Past Medical History: Last updated: 03/14/2007 RED CELL APLASIA, ACQUIRED, ADULT, WITH THYMO (ICD-284.81) INSOMNIA (ICD-780.52) MYASTHENIA GRAVIS W/O (ACUTE) EXACERBATION (ICD-358.00) HEPATITIS B, CHRONIC (ICD-070.32) DYSLIPIDEMIA (ICD-272.4) DIABETES MELLITUS, TYPE II (ICD-250.00) DEPRESSION (ICD-311) ANXIETY INTERNAL HEMORRHOIDS EXTERNAL HEMORRHOIDS COLON POLYPS  Review of Systems  The patient denies fever.         no rash  Physical Exam  General:  normal appearance.   Mouth:  Oropharynx is clear; good dentition, with no thrush or active dental infection.  Msk:  right shoulder:  full rom without pain. the right upper trapezeus area is nontender   Impression & Recommendations:  Problem # 1:  tongue sxs new problem uncertain etiology he is at risk for thrush  Problem # 2:  SHOULDER PAIN, RIGHT (ICD-719.41) recurrent  Medications Added to Medication List This Visit: 1)  Clotrimazole 100 Mg Tabs (Clotrimazole) .Marland Kitchen.. 1 tab 4x a day.  let dissolve in the mouth  Other Orders: Orthopedic Surgeon Referral (Ortho Surgeon) Est. Patient Level III [phone])  Patient Instructions: 1)  clotrimazole 100 mg 4x a day.  let dissolve in the mouth. 2)  refer orthopedics.  you will be  called with a day and time for an appointment 3)  please schedule a "medicare wellness" appointment in 2 months. Prescriptions: CLOTRIMAZOLE 100 MG TABS (CLOTRIMAZOLE) 1 tab 4x a day.  let dissolve in the mouth  #28 x 0   Entered and Authorized by:   Minus Breeding MD   Signed by:   Minus Breeding MD on 07/07/2009   Method used:   [facility]ron[facility] to        [facility]  [phone]* (retail)       [address] [place], [place]  [address]       Ph: [phone] or [phone]       Fax: [phone]   RxID:   [id number]

## 2010-03-01 NOTE — Progress Notes (Signed)
  Phone Note Other Incoming   Request: Send information Summary of Call: Request for records received from [employer]. Request forwarded to [employer].     

## 2010-03-01 NOTE — Letter (Signed)
[facility]   Imported By: Sherian Rein 05/12/2009 08:47:07  _____________________________________________________________________  External Attachment:    Type:   Image     Comment:   External Document

## 2010-03-01 NOTE — Assessment & Plan Note (Signed)
Summary: 2 MO YEARLY-OYU   Vital Signs:  Patient profile:   55 year old male Height:      65 inches (165.10 cm) Weight:      133.38 pounds (60.63 kg) BMI:     22.28 O2 Sat:      97 % on Room air Temp:     98.1 degrees F (36.72 degrees C) oral Pulse rate:   100 / minute BP sitting:   132 / 98  (left arm) Cuff size:   regular  Vitals Entered By: Brenton Grills MA (September 06, 2009 9:29 AM)  O2 Flow:  Room air CC: Medicare wellness/pt c/o sore throat, earache, cough/aj Is Patient Diabetic? Yes Comments pt is no longer taking Clotrimazole   CC:  Medicare wellness/pt c/o sore throat, earache, and cough/aj.  History of Present Illness: pt is here for medicare welllness visit.  he denies memory loss and depression.  he says she is able to perform activities of daily living without assistance.  he has no limitations to physical activity.   Current Medications (verified): 1)  Metformin Hcl 500 Mg Tabs (Metformin Hcl) .... 2-Bid 2)  Onetouch Ultra Test  Strp (Glucose Blood) .... Use As Directed 3)  Onetouch Ultrasoft Lancets  Misc (Lancets) .... Use As Directed 4)  Baraclude 1 Mg  Tabs (Entecavir) .... Take 1 By Mouth Qd 5)  Prednisone 5 Mg  Tabs (Prednisone) .... Take Two Times A Day 6)  Fluoxetine Hcl 20 Mg  Tabs (Fluoxetine Hcl) .... Take 1 By Mouth Qd 7)  Zolpidem Tartrate 10 Mg  Tabs (Zolpidem Tartrate) .... Take 1 By Mouth At Bedtime Prn 8)  Colestipol Hcl 1 Gm Tabs (Colestipol Hcl) .... 5 Qd 9)  Alendronate Sodium 70 Mg Tabs (Alendronate Sodium) .... Q Week 10)  Vitamin D 2000 Unit Tabs (Cholecalciferol) .... Take 1 By Mouth Qd 11)  Pyridostigmine Bromide 60 Mg Tabs (Pyridostigmine Bromide) .Marland Kitchen.. 1 Tab Qd 12)  Omeprazole 20 Mg Cpdr (Omeprazole) .Marland Kitchen.. 1 Qd 13)  Calcium 600 Mg Tabs (Calcium) .... 3 Times Daily 14)  Clotrimazole 10 Mg Lozg (Clotrimazole) .Marland Kitchen.. 1 in The Mouth 4x A Day  Allergies (verified): 1)  ! * D-Penicillamine 2)  ! * Curare 3)  ! Beta Blockers 4)  ! *  Magnesium Salts 5)  ! Azithromycin (Azithromycin)  Family History: no cancer  Social History: Reviewed history and no changes required. married disabled ms limnits exertion never a smoker no etoh  Review of Systems  The patient denies weight loss, weight gain, vision loss, chest pain, syncope, dyspnea on exertion, headaches, abdominal pain, melena, hematochezia, hematuria, suspicious skin lesions, and depression.         slight cough at night.  he has mild heartburn  Physical Exam  Head:  head: healed scar at the right frontal area eyes: no periorbital swelling, no proptosis external nose and ears are normal mouth: no lesion seen Chest Wall:  healed midline sternotomy scar Rectal:  normal external and internal exam.  heme neg  Prostate:  Normal size prostate without masses or tenderness.  Msk:  muscle bulk and strength are grossly normal.  no obvious joint swelling.  gait is normal and steady (but pt says he fatigues quickly) pt easily and quickly performs "get-up-and-go" from a sitting position Psych:  remembers 3/3 at 5 minutes.  excellent recall.  can easily read and write a sentence.  alert and oriented x 3  Additional Exam:  SEPARATE EVALUATION FOLLOWS--EACH PROBLEM HERE IS  NEW, NOT RESPONDING TO TREATMENT, OR POSES SIGNIFICANT RISK TO THE PATIENT'S HEALTH: HISTORY OF THE PRESENT ILLNESS: pt states few days of slight pain at the throat, and associated right earache.   he has chronically decreased hearing loss he has intermittent insomnia PAST MEDICAL HISTORY reviewed and up to date today REVIEW OF SYSTEMS: denies fever and earache PHYSICAL EXAMINATION: no lesion of the oral mucosa.  no erythema.  mucous membranes are moist does not appear anxious nor depressed eac's and tm's are normal IMPRESSION: uri insomnia hearing loss PLAN: see instruction sheet   Impression & Recommendations:  Problem # 1:  ROUTINE GENERAL MEDICAL EXAM@HEALTH  CARE FACL  (ICD-V70.0)  Medications Added to Medication List This Visit: 1)  Cefuroxime Axetil 250 Mg Tabs (Cefuroxime axetil) .Marland Kitchen.. 1 tab two times a day  Other Orders: Audiology (Audio) Est. Patient Level IV [phone]) Medicare -1st Annual Wellness Visit [phone])  Patient Instructions: 1)  please consider these measures for your health:  minimize alcohol.  do not use tobacco products.  have a colonoscopy at least every 10 years from age 29.  keep firearms safely stored.  always use seat belts.  have working smoke alarms in your home.  see an eye doctor and dentist regularly.  never drive under the influence of alcohol or drugs (including prescription drugs).  2)  please let me know what your wishes would be, if artificial life support measures should become necessary.  it is critically important to prevent falling down (keep floor areas well-lit, dry, and free of loose objects). 3)  refer hearling specialist.  you will be called with a day and time for an appointment. 4)  resume zolpidem 10 mg at bedtime as needed for sleep. 5)  cefuroxime 250 mg two times a day. 6)  return 3 mos.  we'll recheck blood presssure then. 7)  (update:  we discussed code status.  pt requests full code, but would not want to be started or maintained on artificial life-support measures if there was not a reasonable chance of recovery) Prescriptions: CEFUROXIME AXETIL 250 MG TABS (CEFUROXIME AXETIL) 1 tab two times a day  #14 x 0   Entered and Authorized by:   Minus Breeding MD   Signed by:   Minus Breeding MD on 09/06/2009   Method used:   Electronically to        [facility]  [phone]* (retail)       [address] [place], [place]  [address]       Ph: [phone] or [phone]       Fax: [phone]   RxID:   [id number] FLUOXETINE HCL 20 MG  TABS (FLUOXETINE HCL) TAKE 1 by mouth QD  #30 x 11   Entered and Authorized by:   Minus Breeding MD   Signed by:   Minus Breeding MD on 09/06/2009   Method used:    Electronically to        [facility]  [phone]* (retail)       [address] [place], [place]  [address]       Ph: [phone] or [phone]       Fax: [phone]   RxID:   [id number] ZOLPIDEM TARTRATE 10 MG  TABS (ZOLPIDEM TARTRATE) TAKE 1 by mouth at bedtime PRN  #30 x 5   Entered and Authorized by:   Minus Breeding MD   Signed by:  Minus Breeding MD on 09/06/2009   Method used:   Print then Give to Patient   RxID:   [id number]

## 2010-03-01 NOTE — Letter (Signed)
Summary: [facility]  [facility]   Imported By: Sherian Rein 07/26/2009 10:52:43  _____________________________________________________________________  External Attachment:    Type:   Image     Comment:   External Document

## 2010-03-01 NOTE — Progress Notes (Signed)
  Phone Note Other Incoming   Request: Send information Summary of Call: Received a completed [employer] Release form. Patient is requesting copies of his records from 01-30-2001 to present. Request forwarded to [employer]. Patient made aware of [employer] fees.

## 2010-03-01 NOTE — Progress Notes (Signed)
Summary: Letter?  Phone Note Call from Patient Call back at Home Phone [phone]   Caller: Patient VM OK Summary of Call: Pt called requestin a letter to submit to school stating that pt has a current Dx of Myasthenia Gravis and he is beong treated by a Neurologist at [facility]. School request this letter come from PCP and not MD at [facility]. Please advise Initial call taken by: Margaret Pyle, CMA,  August 20, 2009 11:47 AM  Follow-up for Phone Call        i printed Follow-up by: Minus Breeding MD,  August 20, 2009 12:32 PM  Additional Follow-up for Phone Call Additional follow up Details #1::        Pt informed Additional Follow-up by: Margaret Pyle, CMA,  August 20, 2009 1:36 PM

## 2010-03-01 NOTE — Assessment & Plan Note (Signed)
Summary: EAR ACHE / SORE THROAT /NWS   Vital Signs:  Patient profile:   55 year old male Height:      65 inches (165.10 cm) Weight:      132.50 pounds (60.23 kg) BMI:     22.13 O2 Sat:      97 % on Room air Temp:     99.0 degrees F (37.22 degrees C) oral Pulse rate:   105 / minute BP sitting:   124 / 86  (left arm) Cuff size:   regular  Vitals Entered By: Brenton Grills MA (October 05, 2009 10:35 AM)  O2 Flow:  Room air CC: Ear ache, HA, cough at night x 5 days/pt is no longer taking Cefuroxime/aj Is Patient Diabetic? Yes   CC:  Ear ache, HA, and cough at night x 5 days/pt is no longer taking Cefuroxime/aj.  History of Present Illness: 5 days of slight choking at night.  he has assoc "stiffness" of the neck.  he has assoc dry cough.    Current Medications (verified): 1)  Metformin Hcl 500 Mg Tabs (Metformin Hcl) .... 2-Bid 2)  Onetouch Ultra Test  Strp (Glucose Blood) .... Use As Directed 3)  Onetouch Ultrasoft Lancets  Misc (Lancets) .... Use As Directed 4)  Baraclude 1 Mg  Tabs (Entecavir) .... Take 1 By Mouth Qd 5)  Prednisone 5 Mg  Tabs (Prednisone) .... Take Two Times A Day 6)  Fluoxetine Hcl 20 Mg  Tabs (Fluoxetine Hcl) .... Take 1 By Mouth Qd 7)  Zolpidem Tartrate 10 Mg  Tabs (Zolpidem Tartrate) .... Take 1 By Mouth At Bedtime Prn 8)  Colestipol Hcl 1 Gm Tabs (Colestipol Hcl) .... 5 Qd 9)  Alendronate Sodium 70 Mg Tabs (Alendronate Sodium) .... Q Week 10)  Vitamin D 2000 Unit Tabs (Cholecalciferol) .... Take 1 By Mouth Qd 11)  Pyridostigmine Bromide 60 Mg Tabs (Pyridostigmine Bromide) .Marland Kitchen.. 1 Tab Qd 12)  Omeprazole 20 Mg Cpdr (Omeprazole) .Marland Kitchen.. 1 Qd 13)  Calcium 600 Mg Tabs (Calcium) .... 3 Times Daily 14)  Cefuroxime Axetil 250 Mg Tabs (Cefuroxime Axetil) .Marland Kitchen.. 1 Tab Two Times A Day  Allergies (verified): 1)  ! * D-Penicillamine 2)  ! * Curare 3)  ! Beta Blockers 4)  ! * Magnesium Salts 5)  ! Azithromycin (Azithromycin)  Past History:  Past Medical  History: Last updated: 03/14/2007 RED CELL APLASIA, ACQUIRED, ADULT, WITH THYMO (ICD-284.81) INSOMNIA (ICD-780.52) MYASTHENIA GRAVIS W/O (ACUTE) EXACERBATION (ICD-358.00) HEPATITIS B, CHRONIC (ICD-070.32) DYSLIPIDEMIA (ICD-272.4) DIABETES MELLITUS, TYPE II (ICD-250.00) DEPRESSION (ICD-311) ANXIETY INTERNAL HEMORRHOIDS EXTERNAL HEMORRHOIDS COLON POLYPS  Review of Systems       he reports low-grade fever.  no dysphagia.  Physical Exam  General:  normal appearance.   Head:  head: no deformity eyes: no periorbital swelling, no proptosis external nose and ears are normal mouth: no lesion seen Lungs:  Clear to auscultation bilaterally. Normal respiratory effort.  Additional Exam:  Hemoglobin A1C            6.0 %     Impression & Recommendations:  Problem # 1:  URI (ICD-465.9) recurrent  Problem # 2:  CHOKING (ICD-933.1) ? due to myasthenia gravis.  Problem # 3:  DIABETES MELLITUS, TYPE II (ICD-250.00) well-controlled  Medications Added to Medication List This Visit: 1)  Amoxicillin 500 Mg Caps (Amoxicillin) .Marland Kitchen.. 1 tab three times a day  Other Orders: ENT Referral (ENT) TLB-TSH (Thyroid Stimulating Hormone) (84443-TSH) TLB-A1C / Hgb A1C (Glycohemoglobin) (83036-A1C) Est. Patient Level IV (  [address])  Patient Instructions: 1)  refer ear-nose-throat dr.  you will be called with a day and time for an appointment. 2)  amoxicillin 500 mg three times a day. 3)  blood tests are being ordered for you today.  please call [phone] to hear your test results. 4)  (update: i left message on phone-tree:  rx as we discussed) Prescriptions: AMOXICILLIN 500 MG CAPS (AMOXICILLIN) 1 tab three times a day  #21 x 0   Entered and Authorized by:   Minus Breeding MD   Signed by:   Minus Breeding MD on 10/05/2009   Method used:   Electron[facility] to        [facility]  [phone]* (retail)       [address] [place], [place]  [address]       Ph: [phone] or [phone]        Fax: [phone]   RxID:   [id number]

## 2010-03-03 NOTE — Assessment & Plan Note (Signed)
Summary: sore arm./cd   Vital Signs:  Patient profile:   55 year old male Height:      65 inches (165.10 cm) Weight:      135 pounds (61.36 kg) BMI:     22.55 O2 Sat:      95 % on Room air Temp:     98.8 degrees F (37.11 degrees C) oral Pulse rate:   108 / minute BP sitting:   124 / 86  (left arm) Cuff size:   regular  Vitals Entered By: Brenton Grills CMA Duncan Dull) (February 17, 2010 1:11 PM)  O2 Flow:  Room air CC: Soreness in right arm from armpit to elbow x 4 weeks/aj Is Patient Diabetic? Yes   CC:  Soreness in right arm from armpit to elbow x 4 weeks/aj.  History of Present Illness: 4 weeks of slight pain at the right axilla, and assoc fullness there.  no local injury.  Current Medications (verified): 1)  Metformin Hcl 500 Mg Tabs (Metformin Hcl) .... 2 Tabs Two Times A Day 2)  Onetouch Ultra Test  Strp (Glucose Blood) .... Use As Directed 3)  Onetouch Ultrasoft Lancets  Misc (Lancets) .... Use As Directed 4)  Baraclude 1 Mg  Tabs (Entecavir) .... Take 1 By Mouth Qd 5)  Prednisone 5 Mg  Tabs (Prednisone) .... Take Two Times A Day 6)  Fluoxetine Hcl 20 Mg  Tabs (Fluoxetine Hcl) .... Take 1 By Mouth Qd 7)  Zolpidem Tartrate 10 Mg  Tabs (Zolpidem Tartrate) .... Take 1 By Mouth At Bedtime Prn 8)  Colestipol Hcl 1 Gm Tabs (Colestipol Hcl) .... 5 Qd 9)  Alendronate Sodium 70 Mg Tabs (Alendronate Sodium) .... Q Week 10)  Vitamin D 2000 Unit Tabs (Cholecalciferol) .... Take 1 By Mouth Qd 11)  Pyridostigmine Bromide 60 Mg Tabs (Pyridostigmine Bromide) .Marland Kitchen.. 1 Tab Qd 12)  Omeprazole 20 Mg Cpdr (Omeprazole) .Marland Kitchen.. 1 Qd 13)  Calcium 600 Mg Tabs (Calcium) .... 3 Times Daily 14)  Tylenol Extra Strength 500 Mg Tabs (Acetaminophen) .Marland Kitchen.. 1-2 Tablets As Needed 15)  Doxycycline Hyclate 100 Mg Caps (Doxycycline Hyclate) .Marland Kitchen.. 1 Tab Two Times A Day 16)  Hydrocortisone 2.5 % Crea (Hydrocortisone) .... Apply To Affected Area Four Times A Day For Rash As Needed 17)  Fluocinonide 0.05 % Oint  (Fluocinonide) .... Apply To Affected Area As Needed  Allergies (verified): 1)  ! * D-Penicillamine 2)  ! * Curare 3)  ! Beta Blockers 4)  ! * Magnesium Salts 5)  ! Azithromycin (Azithromycin)  Past History:  Past Medical History: Last updated: 03/14/2007 RED CELL APLASIA, ACQUIRED, ADULT, WITH THYMO (ICD-284.81) INSOMNIA (ICD-780.52) MYASTHENIA GRAVIS W/O (ACUTE) EXACERBATION (ICD-358.00) HEPATITIS B, CHRONIC (ICD-070.32) DYSLIPIDEMIA (ICD-272.4) DIABETES MELLITUS, TYPE II (ICD-250.00) DEPRESSION (ICD-311) ANXIETY INTERNAL HEMORRHOIDS EXTERNAL HEMORRHOIDS COLON POLYPS  Review of Systems  The patient denies fever.         denies numbness  Physical Exam  General:  normal appearance.   Msk:  right axilla:  there is a longitudonal palpable cord.  i am uncertain if it is a tendon or a thrombosed vein.  Additional Exam:  Hemoglobin A1C            6.2 %      Impression & Recommendations:  Problem # 1:  PHLEBITIS AND THROMBOPHLEBITIS OF OTHER SITE (ICD-451.89) Assessment New uncertain etiology  Problem # 2:  DIABETES MELLITUS, TYPE II (ICD-250.00) well-controlled  Other Orders: Vascular Clinic (Vascular) TLB-A1C / Hgb A1C (Glycohemoglobin) (83036-A1C) Est. Patient  Level IV (09604)  Patient Instructions: 1)  refer to a vein specialist.  you will be called with a day and time for an appointment. 2)  for now, apply a warm moist hand towel to the area.   3)  blood tests are being ordered for you today.  please call [phone] to hear your test results. 4)  Please schedule a follow-up appointment in 3 months. 5)  (update: i left message on phone-tree:  same metformin).   Orders Added: 1)  Vascular Clinic [Vascular] 2)  TLB-A1C / Hgb A1C (Glycohemoglobin) [83036-A1C] 3)  Est. Patient Level IV [91478]

## 2010-03-03 NOTE — Consult Note (Signed)
Summary: [facility]   Imported By: Sherian Rein 02/09/2010 11:15:30  _____________________________________________________________________  External Attachment:    Type:   Image     Comment:   External Document

## 2010-03-10 ENCOUNTER — Encounter: Payer: Self-pay | Admitting: Endocrinology

## 2010-03-10 ENCOUNTER — Ambulatory Visit (INDEPENDENT_AMBULATORY_CARE_PROVIDER_SITE_OTHER): Payer: Medicare Other | Admitting: Vascular Surgery

## 2010-03-10 ENCOUNTER — Encounter (INDEPENDENT_AMBULATORY_CARE_PROVIDER_SITE_OTHER): Payer: Medicare Other

## 2010-03-10 DIAGNOSIS — M79609 Pain in unspecified limb: Secondary | ICD-10-CM

## 2010-03-10 DIAGNOSIS — I808 Phlebitis and thrombophlebitis of other sites: Secondary | ICD-10-CM

## 2010-03-11 ENCOUNTER — Ambulatory Visit (HOSPITAL_COMMUNITY)
Admission: RE | Admit: 2010-03-11 | Discharge: 2010-03-11 | Disposition: A | Discharge status: Home / Self Care | Payer: Medicare Other | Source: Ambulatory Visit | Attending: Vascular Surgery | Admitting: Vascular Surgery

## 2010-03-11 DIAGNOSIS — I871 Compression of vein: Secondary | ICD-10-CM | POA: Insufficient documentation

## 2010-03-11 DIAGNOSIS — I742 Embolism and thrombosis of arteries of the upper extremities: Secondary | ICD-10-CM

## 2010-03-11 DIAGNOSIS — R609 Edema, unspecified: Secondary | ICD-10-CM | POA: Insufficient documentation

## 2010-03-11 LAB — POCT I-STAT, CHEM 8
BUN: 16 mg/dL (ref 6–23)
Chloride: 103 mEq/L (ref 96–112)
Glucose, Bld: 106 mg/dL — ABNORMAL HIGH (ref 70–99)
HCT: 44 % (ref 39.0–52.0)
Potassium: 3.4 mEq/L — ABNORMAL LOW (ref 3.5–5.1)

## 2010-03-11 LAB — GLUCOSE, CAPILLARY: Glucose-Capillary: 115 mg/dL — ABNORMAL HIGH (ref 70–99)

## 2010-03-14 NOTE — Assessment & Plan Note (Signed)
OFFICE VISIT  [name], [name] DOB:  03-07-1955                                       03/10/2010 [id number]#:[id number]  CHIEF COMPLAINT:  Right arm swelling.  HISTORY OF PRESENT ILLNESS:  The patient is a 55 year old male referred by Dr. Everardo All for chronic right arm swelling.  The patient stated that a couple of months ago he began to develop swelling in his right upper extremity.  This is sometimes worsened if he played ping pong.  He apparently plays fairly frequently.  He also has had some soreness in his right neck and shoulder.  He denies any prior history of DVT.  He has no known trauma to his right upper extremity.  He does have a history of a thymectomy for thymoma in 2003.  This was done for.  He also had radiation therapy after removal of this.  He had not had any problems with swelling in the right upper extremity prior to this event. He does not really have significant pain at this point.  He states that the swelling has improved somewhat.  CHRONIC MEDICAL PROBLEMS:  Past medical history includes diabetes, elevated cholesterol and myasthenia gravis.  These are all currently controlled.  SOCIAL HISTORY:  He is married.  He has 2 children.  He is a nonsmoker, nonconsumer of alcohol.  FAMILY HISTORY:  Unremarkable.  REVIEW OF SYSTEMS:  Full 12 point review of systems was performed on the patient today.  This was negative except for a mild rash on his back.  MEDICATIONS: 1. Metformin. 2. Baraclude. 3. Prednisone 5 mg twice a day. 4. Fluoxetine 20 mg once a day. 5. Zolpidem 10 mg q.h.s. 6. Colestipol 1 g 5 daily. 7. Alendronate 70 mg once a week. 8. Vitamin D once daily. 9. Pyridostigmine 60 mg one daily. 10.Omeprazole 20 mg once a day. 11.Calcium 600 mg three times a day. 12.Tylenol p.r.n. 13.Doxycycline 100 mg twice a day. 14.Hydrocortisone cream for his back rash 4 times a day. 15.Fluocinonide cream once daily.  ALLERGIES:  He has an  allergy listed to D penicillamine, curare, beta- blockers, magnesium salts and azithromycin.  PHYSICAL EXAM:  Vital signs:  Blood pressure is 123/85 in the left arm, heart rate 111 and regular.  HEENT:  Unremarkable.  Neck:  Has 2+ carotid pulses without bruit.  There are no obvious visible collaterals on the neck.  Chest:  Clear to auscultation.  Cardiac:  Regular rate and rhythm without murmur.  Abdomen:  Thin, soft, nontender, nondistended. Musculoskeletal:  Shows no obvious major joint deformities.  Neurologic: Shows symmetric upper extremity and lower extremity motor strength which is 5/5.  Skin:  Has no ulcerations on the fingertips or hand.  Upper extremity exam shows 2+ brachial and radial pulse on the right side. The right axillary and upper brachial vein is quite prominent.  Placement of a SonoSite over this there is no obvious thrombus within the vein.  He had a duplex ultrasound today of the right upper extremity which showed no evidence of DVT.  However, the flow within the venous system was pulsatile in character.  ASSESSMENT:  Right upper extremity swelling.  The differential diagnosis includes possible innominate or subclavian vein narrowing from his previous radiation and thymectomy.  This also could represent a thoracic outlet syndrome type picture.  He currently is fairly asymptomatic.  I believe the best  option for initial workup of this would be a right central venogram to determine whether or not his innominate and subclavian veins on the right side are patent.  We will schedule this for Friday 03/11/2010.  The risks, benefits, possible complications of the central venogram as well as possible angioplasty or stenting of the central vein were explained to the patient today.  He understands and agrees to proceed.    Janetta Hora. Gaile Allmon, MD Electronically Signed  CEF/MEDQ  D:  03/11/2010  T:  03/11/2010  Job:  [id number]  cc:   Gregary Signs A. Everardo All, MD

## 2010-03-15 ENCOUNTER — Other Ambulatory Visit: Payer: Self-pay | Admitting: Vascular Surgery

## 2010-03-15 DIAGNOSIS — M549 Dorsalgia, unspecified: Secondary | ICD-10-CM

## 2010-03-23 NOTE — Op Note (Signed)
  NAMENESANEL, AGUILA              ACCOUNT NO.:  [id number]  MEDICAL RECORD NO.:  [id number]           PATIENT TYPE:  O  LOCATION:  [facility]                         FACILITY:  [facility]  PHYSICIAN:  Janetta Hora. Tonyetta Berko, MD  DATE OF BIRTH:  March 22, 1955  DATE OF PROCEDURE:  03/11/2010 DATE OF DISCHARGE:                              OPERATIVE REPORT   PROCEDURE:  Right central venogram.  PREOPERATIVE DIAGNOSIS:  Right arm edema.  POSTOPERATIVE DIAGNOSIS:  Right arm edema.  SURGEON:  Janetta Hora. Kodey Xue, MD  ANESTHESIA:  None.  OPERATIVE DETAILS:  After obtaining informed consent, the patient was brought to the [facility] lab.  The patient was placed in supine position on the angio table.  A peripheral IV had already been started in the right hand in the short-stay area.  Next, contrast was injected through the right peripheral IV and contrast angiogram was performed of the entire right upper extremity and central veins.  The right cephalic vein is patent with some narrowing at the level of the humeral head.  The deep brachial and axillary veins are patent.  However, there is retrograde filling of the axillary vein from the level of the scapula back down towards the axilla.  The right subclavian vein is patent; however, there are images did show what appears to be several synechiae within the subclavian vein.  There is flow into the innominate vein, and on oblique views the origin of the subclavian and right internal jugular veins can be seen to be widely patent and forming a confluence into the innominate vein. However, again noted is antegrade filling of the cephalic vein into the subclavian vein, but retrograde filling down the axillary vein suggesting again that the synechiae and the subclavian vein are flow- limiting.  The patient tolerated the procedure well.  There were no complications.  OPERATIVE FINDINGS:  Right subclavian vein stenosis possibly related to either thoracic outlet syndrome  or previous radiation of the chest.  The patient will be scheduled for a CT angiogram with venous phase contrast to further delineate the anatomy of this region.  He will follow up with me in 2 weeks' time.     Janetta Hora. Alantra Popoca, MD     CEF/MEDQ  D:  03/11/2010  T:  03/12/2010  Job:  [id number]  Electronically Signed by Fabienne Bruns MD on 03/23/2010 03:08:53 PM

## 2010-03-24 ENCOUNTER — Encounter: Payer: Self-pay | Admitting: Endocrinology

## 2010-03-24 ENCOUNTER — Encounter: Payer: Medicare Other | Admitting: Vascular Surgery

## 2010-03-24 ENCOUNTER — Encounter (INDEPENDENT_AMBULATORY_CARE_PROVIDER_SITE_OTHER): Payer: Medicare Other | Admitting: Vascular Surgery

## 2010-03-24 ENCOUNTER — Ambulatory Visit
Admission: RE | Admit: 2010-03-24 | Discharge: 2010-03-24 | Disposition: A | Discharge status: Home / Self Care | Payer: Medicare Other | Source: Ambulatory Visit | Attending: Vascular Surgery | Admitting: Vascular Surgery

## 2010-03-24 DIAGNOSIS — I808 Phlebitis and thrombophlebitis of other sites: Secondary | ICD-10-CM

## 2010-03-24 DIAGNOSIS — M549 Dorsalgia, unspecified: Secondary | ICD-10-CM

## 2010-03-24 DIAGNOSIS — M7989 Other specified soft tissue disorders: Secondary | ICD-10-CM

## 2010-03-24 MED ORDER — IOHEXOL 300 MG/ML  SOLN
100.0000 mL | Freq: Once | INTRAMUSCULAR | Status: AC | PRN
  Administered 2010-03-24: 100 mL via INTRAVENOUS

## 2010-03-25 NOTE — Assessment & Plan Note (Signed)
OFFICE VISIT  [name], CINA DOB:  Dec 11, 1955                                       03/24/2010 ZOXWR#:[id number]  The patient returns for followup today.  He recently underwent a venogram of his right upper extremity which showed possible webbing of his right subclavian vein but actually no significant stenosis of the central venous system.  He states the swelling is slightly improved although he has been limiting his activities over the past few weeks and has not been playing any ping pong as well as not doing any heavy lifting with his right arm.  He had a CT scan of the chest today to make sure that he had no evidence of thoracic outlet type obstruction.  This showed no cervical ribs and did not really show any significant obstruction through the thoracic outlet on static images.  There was no evidence of recurrent disease from his thymus.  There was no obvious evidence of radiation changes.  The only significant finding of his venogram was retrograde flow through his axillary vein.  I discussed the CT scan and reviewed the venogram as well as the CT scan with Dr. Richarda Overlie from radiology today who concurs that there is no obvious central venous problem occurring at this point.  PHYSICAL EXAM:  Today blood pressure is 116/76 in the right arm, heart rate is 97 and regular.  Temperature is 97.4.  Right upper extremity has minimal swelling compared to the left.  He still has prominent axillary vein on the right side.  Although the patient may still have some element of thoracic outlet syndrome we do not really have any objective findings for this currently.  I believe the best option for now would be for him to continue to limit his activities of his right upper extremity for a few more weeks to see if this improves his symptoms.  He will return in 1 month's time for further followup.  If his symptoms worsen over time we will consider whether or not he  would benefit from possible physical therapy or whether or not he needs further evaluation of possible thoracic outlet syndrome.    Janetta Hora. Shawnta Schlegel, MD Electronically Signed  CEF/MEDQ  D:  03/24/2010  T:  03/25/2010  Job:  [id number]  cc:   Gregary Signs A. Everardo All, MD

## 2010-03-25 NOTE — Procedures (Unsigned)
DUPLEX DEEP VENOUS EXAM - UPPER EXTREMITY  INDICATION:  Rule out upper extremity deep vein thrombosis, right upper extremity tightness.  HISTORY:  Edema:  No Trauma/Surgery:  No Pain:  Yes PE:  No Previous DVT:  No Anticoagulants:  No Other:  Myasthenia gravis  DUPLEX EXAM:                                            Bas/               IJV   SCV     AXV    BrachV  Ceph V               R  L  R   L   R  L   R   L   R  L Thrombosis    o  o  o   o   o      o       o Spontaneous   +  +  +   +   +      +       + Phasic        +  +  +   +   +      +       + Augmentation  +  +  +   +   +      +       + Compressible  +  +  +   +   +      +       + Competent     +  +  +   +   +      +       + Legend:  + - yes  o - no  p - partial  D - decreased    IMPRESSION: 1. No evidence of right upper extremity deep venous thrombosis. 2. Pulsatile venous system noted.     ___________________________________________ Janetta Hora Darrick Penna, MD  EM/MEDQ  D:  03/10/2010  T:  03/10/2010  Job:  [id number]

## 2010-04-07 ENCOUNTER — Ambulatory Visit (INDEPENDENT_AMBULATORY_CARE_PROVIDER_SITE_OTHER): Payer: Medicare Other | Admitting: Gastroenterology

## 2010-04-07 DIAGNOSIS — B181 Chronic viral hepatitis B without delta-agent: Secondary | ICD-10-CM

## 2010-04-07 NOTE — Consult Note (Signed)
Summary: Sherren Kerns MD/V Isac Sarna Fields MD/V V S   Imported By: Lester Montclair 03/29/2010 10:44:11  _____________________________________________________________________  External Attachment:    Type:   Image     Comment:   External Document

## 2010-04-12 NOTE — Letter (Signed)
Summary: [facility]  [facility]   Imported By: Sherian Rein 04/06/2010 11:57:22  _____________________________________________________________________  External Attachment:    Type:   Image     Comment:   External Document

## 2010-04-21 ENCOUNTER — Ambulatory Visit (INDEPENDENT_AMBULATORY_CARE_PROVIDER_SITE_OTHER): Payer: Medicare Other | Admitting: Vascular Surgery

## 2010-04-21 DIAGNOSIS — M7989 Other specified soft tissue disorders: Secondary | ICD-10-CM

## 2010-04-22 NOTE — Assessment & Plan Note (Signed)
OFFICE VISIT  [name], [name] DOB:  Mar 15, 1955                                       04/21/2010 GEXBM#:[id number]  The patient was last seen on 03/24/2010.  He had right upper extremity swelling.  He underwent a venogram of the right upper extremity which showed no significant central venous stenosis.  He also had a CT scan of the chest which showed no impingement of the axillary subclavian system. He also had no evidence of cervical ribs.  He was placed on conservative management with basically just decreased exercise of his right upper extremity.  He returns today stating that the swelling has continued to improve somewhat but is still slightly present.  Overall though he feels better.  PHYSICAL EXAM:  Today blood pressure is 123/82 in the left arm, heart rate is 92 and regular.  Temperature is 98.1.  Right upper extremity: The biceps region is slightly more prominent than the left side and there are visible veins in the right shoulder area.  In summary, the patient's symptoms are getting better with conservative management.  If he has increased swelling or worsening of his symptoms again we will do further workup for thoracic outlet syndrome including possible active and static CT scans to look for impingement.  Overall though if his symptoms continue to improve I do not believe further followup is warranted at this time.  All this was discussed with the patient today.  He will follow up an as-needed basis.    Janetta Hora. Ulisses Vondrak, MD Electronically Signed  CEF/MEDQ  D:  04/21/2010  T:  04/22/2010  Job:  [id number]

## 2010-05-10 ENCOUNTER — Other Ambulatory Visit: Payer: Self-pay | Admitting: Endocrinology

## 2010-05-13 ENCOUNTER — Other Ambulatory Visit: Payer: Self-pay | Admitting: Endocrinology

## 2010-05-23 ENCOUNTER — Ambulatory Visit (INDEPENDENT_AMBULATORY_CARE_PROVIDER_SITE_OTHER): Payer: Medicare Other | Admitting: Endocrinology

## 2010-05-23 ENCOUNTER — Encounter: Payer: Self-pay | Admitting: Endocrinology

## 2010-05-23 VITALS — BP 112/76 | HR 96 | Temp 97.9°F | Ht 65.0 in | Wt 131.8 lb

## 2010-05-23 DIAGNOSIS — E119 Type 2 diabetes mellitus without complications: Secondary | ICD-10-CM

## 2010-05-23 MED ORDER — CEFUROXIME AXETIL 250 MG PO TABS: 250.0000 mg | ORAL_TABLET | Freq: Two times a day (BID) | ORAL | Status: AC

## 2010-05-23 MED ORDER — METFORMIN HCL 500 MG PO TABS: 1000.0000 mg | ORAL_TABLET | Freq: Two times a day (BID) | ORAL | Status: DC

## 2010-05-23 NOTE — Patient Instructions (Addendum)
i have sent a prescription for an antibiotic to your pharmacy. You should also take "loratadine-d" (non-prescription) as needed for congestion. blood tests are being ordered for you today.  please call [phone] to hear your test results. Please make a "medicare wellness" appointment in 4 months.

## 2010-05-23 NOTE — Progress Notes (Signed)
  Subjective:    Patient ID: [name], male    DOB: Dec 27, 1955, 55 y.o.   MRN: [id number]  HPI Pt states 1 week of slight pain at the right maxillary area, and assoc congestion. Pt says his cbg's are well-controlled Past Medical History  Diagnosis Date  . 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 Insomnia, unspecified   . Myasthenia gravis without exacerbation   . Viral hepatitis B without mention of hepatic coma, chronic, without mention of hepatitis delta   . Other and unspecified hyperlipidemia   . Type II or unspecified type diabetes mellitus without mention of complication, not stated as uncontrolled   . Depressive disorder, not elsewhere classified   . Anxiety   . Internal hemorrhoids   . External hemorrhoid   . Colon polyp    Past Surgical History  Procedure Date  . Thymectomy     reports that he has never smoked. He does not have any smokeless tobacco history on file. He reports that he does not drink alcohol or use illicit drugs. family history is not on file. Allergies  Allergen Reactions  . Azithromycin     REACTION: exac of his mg  . Beta Adrenergic Blockers     REACTION: exac of mg    Review of Systems He has sneezing, but no earache.    Objective:   Physical Exam head: no deformity eyes: no periorbital swelling, no proptosis external nose and ears are normal mouth: no lesion seen Ears:  eac's are normal.  Right tm is red.  Left is normal       Assessment & Plan:  Allergic rhinitis, new problem Right aom, new Dm, well-controlled

## 2010-06-02 ENCOUNTER — Other Ambulatory Visit: Payer: Self-pay | Admitting: Endocrinology

## 2010-06-14 NOTE — Assessment & Plan Note (Signed)
[facility]                         GASTROENTEROLOGY OFFICE NOTE   NAME:DUONGMason, Dibiasio                       MRN:          [id number]  DATE:12/14/2006                            DOB:          Apr 02, 1955    PHYSICIAN REQUESTING CONSULT:  [name].   REASON FOR CONSULT:  Hemorrhoidal symptoms and occasional constipation.   HISTORY OF PRESENT ILLNESS:  This is a 55 year old [name] male with a  history of myasthenia gravis and hepatitis B.  He relates a history of  intermittent hemorrhoidal symptoms since the 1970s and he occasionally  notes some mild swelling and bright red bleeding.  He has had  intermittent mild constipation.  He has had no abdominal pain, change in  bowel habits, change in stool caliber or weight loss.  There is no  family history of colon cancer, colon polyps or inflammatory bowel  disease.   PAST MEDICAL HISTORY:  Myasthenia gravis  Thymoma, status post thymectomy.  Diabetes mellitus.  Hyperlipidemia.  Depression.  Anxiety,  Chronic hepatitis B reactivated on prednisone.   CURRENT MEDICATIONS:  Listed on the chart, updated and reviewed.   MEDICATION ALLERGIES:  UNKNOWN.   SOCIAL HISTORY:  Per the handwritten form.   REVIEW OF SYSTEMS:  Per the handwritten form.   PHYSICAL EXAMINATION:  Well-developed, well-nourished, no acute  distress.  Height 5 feet 5 inches, weight 133.4 pounds, blood pressure  is 110/78, pulse 92 and regular.  HEENT:  Anicteric sclerae, oropharynx clear.  CHEST:  Clear to auscultation bilaterally.  CARDIAC:  Regular rate and rhythm without murmurs appreciated.  ABDOMEN:  Soft, nontender, nondistended, normoactive bowel sounds.  No  palpable organomegaly, masses or hernias.  RECTAL:  Deferred to time of colonoscopy.  EXTREMITIES:  Without clubbing, cyanosis or edema.  NEUROLOGIC:  Alert and oriented x3.  Grossly nonfocal.   ASSESSMENT/PLAN:  1. Long term small volume hematochezia with  other symptoms consistent      with hemorrhoids.  Long term mild constipation.  Rule out      colorectal neoplasms, rule out hemorrhoids.  Risks, benefits and      alternatives to colonoscopy, possible biopsy, possible polypectomy      and possible destruction of internal hemorrhoids discussed with the      patient and he consents to proceed.  This will be scheduled      electively.  He is to maintain a long term high-fiber diet with      adequate fluid intake.  2. Chronic hepatitis B.  Recent liver function tests show an ALT of      76, an AST of 49, a total bilirubin of 1.3 and the remainder being      normal.  He is followed by the [facility] for this problem.     Venita Lick. Russella Dar, MD, [facility]  Electronically Signed    MTS/MedQ  DD: 12/21/2006  DT: 12/21/2006  Job #: [id number]   cc:   Gregary Signs A. Everardo All, MD

## 2010-06-17 NOTE — H&P (Signed)
[name], [name]              ACCOUNT NO.:  [id number]   MEDICAL RECORD NO.:  [id number]          PATIENT TYPE:  INP   LOCATION:  5041                         FACILITY:  [facility]   PHYSICIAN:  Sean A. Everardo All, M.D. [facility] OF BIRTH:  05-Jul-1955   DATE OF ADMISSION:  03/27/2005  DATE OF DISCHARGE:                                HISTORY & PHYSICAL   REASON FOR ADMISSION:  Shortness of breath.   HISTORY OF PRESENT ILLNESS:  55 year old man with a several year history of  myasthenia gravis.  He has about a month of increasing intermittent  shortness of breath.  He has some associated dysphagia and weakness which is  now moderate and occurs throughout the right side of the body.  He saw Dr.  Georgina Pillion, his neurologist at [facility] several weeks ago.  He states that she  increased his prednisone and his Mestinon but he continues to worsen.  He  has an appointment there in three days but he and his wife do not feel they  can wait that long for urgent treatment.   PAST MEDICAL HISTORY:  1.  Dyslipidemia.  2.  Chronic hepatitis B.  3.  Insomnia.  4.  Thymoma.  5.  Depression.  6.  Hyperglycemia.   MEDICATIONS:  1.  Mestinon 180 mg extended-release daily.  2.  Amitriptyline 25 mg q.h.s.  3.  An uncertain type of eye drop.  4.  Prednisone 40 mg every other day.   SOCIAL HISTORY:  He is disabled and his wife is with him here today.   FAMILY HISTORY:  Negative for the above.   REVIEW OF SYSTEMS:  Denies the following;  Fever, weight gain, weight loss,  chest pain, loss of consciousness, nausea, vomiting, rectal bleeding,  hematuria, urinary incontinence, and decreased force.  On further  questioning he states he does have slight rectal bleeding from his  hemorrhoids.  He does have slight memory loss and tremor.   PHYSICAL EXAMINATION:  VITAL SIGNS:  Blood pressure 132/82, heart rate 109,  temperature 98.9, weight 133.  GENERAL:  No distress.  SKIN:  Not diaphoretic.  I do not see a  rash.  HEENT:  No proptosis.  No periorbital swelling.  Pharynx:  No erythema.  NECK:  Supple.  CHEST:  Clear to auscultation and he does not appear to have any respiratory  distress.  CARDIOVASCULAR:  No JVD.  No edema.  Regular rate and rhythm.  No murmur.  Pedal pulses intact and there is no bruit at the carotid arteries.  ABDOMEN:  Soft, nontender.  No hepatosplenomegaly.  No mass.  RECTAL:  Not done at this time due to the urgency of getting him to the  hospital.  EXTREMITIES:  No deformity is seen.  NEUROLOGIC:  He is alert and well oriented.  Cranial nerves are intact  except that he has a right-sided facial droop.  Gag is present bilaterally,  but decreased from what I would consider normal.  Sensation is diffusely  intact to touch.  Motor function 4/5 on the right side of the face and body  and 5/5 on  the left side.   IMPRESSION:  1.  Exacerbation of myasthenia gravis.  2.  The questions of dysphagia and shortness of breath make this an urgent      situation such that he cannot wait for his appointment at [facility] in three      days.  3.  Slight tachycardia, uncertain etiology.  4.  Slight memory loss of uncertain etiology.  5.  Slight hemorrhoidal bleeding.  6.  Other chronic medical problems as noted above.   PLAN:  1.  Admit to [facility].  2.  Consult neurology.  3.  Check laboratory studies including arterial blood gas on room air.  4.  Solu-Medrol.  5.  Increase Mestinon.  6.  I discussed code status with patient and his wife and they state they      will consider this.           ______________________________  Cleophas Dunker. Everardo All, M.D. [facility]     SAE/MEDQ  D:  03/27/2005  T:  03/27/2005  Job:  [id number]

## 2010-06-17 NOTE — Op Note (Signed)
NAMECRISTIN, [name]                          ACCOUNT NO.:  [id number]   MEDICAL RECORD NO.:  [id number]                   PATIENT TYPE:  INP   LOCATION:  NA                                   FACILITY:  [facility]   PHYSICIAN:  [name]. [name], M.D.         DATE OF BIRTH:  14-Sep-1955   DATE OF PROCEDURE:  12/10/2001  DATE OF DISCHARGE:                                 OPERATIVE REPORT   PREOPERATIVE DIAGNOSIS:  Myasthenia gravis with mediastinal mass.   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   PROCEDURE:  Partial sternotomy and thymectomy.   SURGEON:  [name]. [name], M.D.   ASSISTANT:  [name], M.D.   ANESTHESIA:  General.   FINDINGS:  A 2-3 cm mass in the thymus extending posterior and superior to  the innominate vein with partial encirclement of the innominate vein.  The  mass appeared invasive grossly.   CLINICAL NOTE:  The patient is a 55 year old gentleman who in September  developed symptoms of weakness and difficulty with speech and swallowing.  He underwent neurologic evaluation including testing for myasthenia gravis.  Myasthenia gravis was confirmed with antibody testing.  A CT of the chest  was performed which showed a small mass in the anterior mediastinum, and  this was approximately 2-3 cm.  The patient was referred for consideration  for thymectomy.  The indications, risks, benefits, and alternative  treatments were discussed in detail with the patient and his wife.  He  understood and accepted the risks of the procedure and agreed to proceed.   OPERATIVE NOTE:  The patient was brought to the preoperative holding area on  December 10, 2001.  Intravenous access was obtained.  Intravenous  antibiotics were administered.  The patient was taken to the operating room.  PSOs were applied for DVT prophylaxis.  The patient was anesthetized and  intubated.  A Foley catheter was placed.  The neck, chest, and  abdomen were  prepped and draped in the usual fashion.   An incision was made of the upper half of the sternum in the midline, and it  was carried through the skin and subcutaneous tissue.  Hemostasis was  achieved with electrocautery.  A partial sternotomy then was performed  dividing the manubrium in the upper portion of the sternal body.  The  incision was T'd off in the 4th intercostal space using an oscillating saw.  The sternal retractor was placed.  A palpable mass was present in the  superior and anterior mediastinum.  Dissection was begun inferiorly away  from the actual mass.  The inferior aspect of the thymus was identified, and  it was dissected off the pericardium as well as both pleura, taking all the  anterior mediastinal fat along with the thymus tissue.  The dissection was  very easy in this area.  As the dissection approached the level of the  innominate vein,  there began to be more dense adhesions in the anterior  aspect of the thymus and there was a palpable mass that extended below the  innominate vein.  The mass in this region still came off very easily from  the pericardial surface posteriorly.   Next, the roughed superior pole of the thymus was identified and dissected  out; this came out easily.  There were no masses or adhesions in this area.  The primary portion of the mass was in the right superior pole of the  thymus.  Dissection was carried along the top edge of the innominate vein.  There were multiple large feeding branches of the vein, and the vein was in  very close opposition to the vein, particularly near the junction with the  IVC and the confluence into the superior vena cava.  The mass was very  densely adhering in this area, and in one area a very small segment of the  vein was taken with the mass as it could not be separated off.  This was  repaired with a running 5-0 Prolene suture.  Vascular loops were placed  around the innominate vein on both  sides to allow for retraction and  improved visualization.  The dissection in this area was very difficult but  a complete resection was performed in this region.  Working from the left to  the right, the mass was dissected off the innominate artery.  There was no  clear plane between the mass and the adventitia of the innominate artery,  but it clearly did not involve the media of the vessel.  During this  dissection there was a small ulceration of the innominate artery which was  repaired with a 5-0 Prolene pledgeted suture.  Multiple large feeding  vessels from the mediastinal tissue were clipped in this area with resection  of the mass.  The right superior pole was mobilized in the neck and brought  down.  The dissection then was carried from the superior edge underneath the  mass using a combination of sharp dissection and electrocautery.  The  resection was completed.  The thymus was sent for frozen section which  revealed an epitheloid thymoma.  The wound was copiously irrigated with warm  saline containing vancomycin.  A #36-French chest tube was placed  subxiphoid.  Blunt dissection was used to dissect the pericardium off the  posterior aspect of the sternum before placing the chest tube.  Both pleura  appeared to be intact.  After ensuring adequate hemostasis, the sternotomy  was closed.  Single simple heavy-gauged stainless steel wires were used to  close the transverse sternotomies on each side, and three interrupted  stainless steel wires were used to close the manubrium in the upper portion  of the sternal body.  The pectoralis fascia was closed with a running #1  Vicryl suture.  The subcutaneous tissue was closed with a running 2-0 Vicryl  suture, and the skin was closed with a 3-0 Vicryl subcuticular suture.  All  sponge, needle, and instrument counts were correct at the end of the procedure.  The patient tolerated the procedure well and was taken from the  operating room to  the surgical intensive care unit, extubated, and in stable  condition.  [name] [name], M.D.    SCH/MEDQ  D:  12/10/2001  T:  12/11/2001  Job:  [id number]   cc:   [name] A. Orlin Hilding, M.D.  [address]  [address]  [place]  [place] [address]  Fax: [phone]   Gregary Signs A. Everardo All, M.D. [facility]

## 2010-06-17 NOTE — Consult Note (Signed)
[name], [name]              ACCOUNT NO.:  [id number]   MEDICAL RECORD NO.:  [id number]          PATIENT TYPE:  INP   LOCATION:  5041                         FACILITY:  [facility]   PHYSICIAN:  Bevelyn Buckles. [place], M.D.DATE OF BIRTH:  27-Aug-1955   DATE OF CONSULTATION:  03/27/2005  DATE OF DISCHARGE:                                   CONSULTATION   NEUROLOGY STROKE CONSULT   REQUESTING PHYSICIAN:  Dr. Everardo All.   REASON FOR CONSULT:  Myasthenia gravis exacerbation.   HISTORY OF PRESENT ILLNESS:  [name] is a 55 year old male with a past  medical history of myasthenia gravis, who was diagnosed 3 years ago.  He  presents with a 3-week history of worsening right eye droop, double vision,  neck weakness, difficulty swallowing/chewing, at times difficulty breathing  and 4 extremity weakness.  At times, the patient also feels unsteady and  dizzy on his feet, and he feels his speech has slowed.  The patient is  followed by [facility] for his myasthenia gravis and recently the patient had his  prednisone and Mestinon increased secondary to worsening of symptoms.  In  the past, the patient has received IV IgM plasma exchange for his myasthenia  gravis and responded fairly well.  He denies any symptoms of headache,  vertigo, falls or loss of consciousness.   PAST MEDICAL HISTORY:  Positive for myasthenia gravis status post thymectomy  and bilateral cataract surgery.   CURRENT MEDICATIONS:  Prednisone, Mestinon and amitriptyline.   ALLERGIES:  THE PATIENT HAS NO KNOWN DRUG ALLERGIES.   FAMILY HISTORY:  Noncontributory.   SOCIAL HISTORY:  The patient lives with his wife and kids, denies any  tobacco or alcohol use and he is currently on disability.   REVIEW OF SYSTEMS POSITIVE:  As per HPI and also positive for mood swings.   REVIEW OF SYSTEMS NEGATIVE:  As per HPI and greater than 6 other organ  systems.   EXAMINATION:  VITAL SIGNS:  Temperature is 98.8, pulse is 108, respirations  16,  blood pressure is 126/84 and O2 sat is 96%.  HEENT:  Normocephalic, atraumatic.  The patient does have right ptosis and  twitches with his right eyelid and almost completely close.  Extraocular  muscles are intact.  The patient has weak eye closure muscles bilateral.  NECK:  Supple and no carotid bruits.  HEART:  Regular.  LUNGS:  Clear.  ABDOMEN:  Soft and nontender.  EXTREMITIES:  Good pulses with no edema.  NEUROLOGICAL EXAM:  The patient is awake, alert and oriented x3.  Language  is slow yet fluent.  Memory and knowledge are within normal limits.  Cranial  nerves:  The patient has a right ptosis noted with the right eyelid almost  completely closed.  Extraocular muscles are intact.  Face is symmetric.  The  patient does have myosthenic snarl.  Tongue is midline.  Motor examination  shows 4 to 4+/5 strength.  The patient become weaker with stressing or  fatiguing.  The patient has normal tone and no drift.  Sensory examination  is within normal limits to light touch, reflexes  are trace throughout and  toes are downgoing bilaterally.  Cerebellar function was within normal  limits.  Finger-to-nose and gait was not assessed secondary to safety.   LABORATORY:  WBC is 6.1, hemoglobin 16.8, hematocrit is 47.2 and platelets  157.  Sodium is 138, potassium is 4.3, chloride is 103, CO2 is 28, BUN is  10, creatinine is 0.9 and glucose is 227.  LFTs:  ALT is 48 and AST is 33.  Calcium is 9.1.  A chest x-ray showed no acute disease.   IMPRESSION:  This is a 55 year old male with myasthenia gravis, who had  slowly progressive worsening of his symptoms and this could be secondary to  myasthenia gravis exacerbation.  There is concern especially with his  increase in Mestinon, could he have worsening of symptoms secondary to too  much Mestinon.  I will perform a Tensilon test today and depending on  response, we will probably start the patient on IV IG for 5 days.  We may  need also to schedule a  plasma exchange and IV IG in the future if the  patient continues to have these frequent exacerbations.  We will get PT, OT  and speech consults.  We will continue the patient on steroids and Mestinon  for now, and continue his other medications as well.  We will recommend  contacting [facility] M[facility] about his status and if status declines, consider  transferring for [facility].  Get respiratory therapy to check NIFsand  FVCs at least twice a day.  We will also culture the patient with a urine  culture and blood cultures to rule out possible infection.   ADDENDUM:  Tensilon test was performed and the patient had dramatic response  with ptosis resolving and proximal weakness with 4+ to 5/5 strength.  The  patient tolerated this procedure well.  We will start the patient on IV IG  for 5 days.      Bevelyn Buckles. Nash Shearer, M.D.  Electronically Signed     DRC/MEDQ  D:  03/27/2005  T:  03/28/2005  Job:  [id number]

## 2010-06-17 NOTE — Op Note (Signed)
   [name], [name]                        ACCOUNT NO.:  [id number]   MEDICAL RECORD NO.:  [id number]                   PATIENT TYPE:  INP   LOCATION:  0479                                 [facility]ACILITY:  [facility]   PHYSICIAN:  Deanna Artis. Sharene Skeans, M.D.           DATE OF BIRTH:  [birth year]   DATE OF PROCEDURE:  04/14/2002  DATE OF DISCHARGE:                                 OPERATIVE REPORT   PROCEDURE:  Tensilon test.   INDICATION:  Myasthenia gravis and exacerbation.   DESCRIPTION OF PROCEDURE:  Through saline lock, the patient had a double-  blinded evaluation of the effect of Tensilon.  One tenth/mL, followed by  9/10th of a mL was placed in the saline lock and flushed into the patient.  In a blinded fashion, the patient showed increased strength after he  received a total of 10 mg of Mestinon and not before.  Strength returned to  normal with the exception of the left deltoid and biceps that were 4+/5.  In  addition, the patient's eyelid ptosis has improved somewhat.  This basically  corrected his neurologic examination to normal.  He became quite queasy,  blood pressure dropped from 110/80 to 95/70 and before recovering a resting  pulse dropped from 84 down to 60 and back up to 72.  Otherwise, the patient  tolerated the procedure well other than the queasiness.  This is a positive  Tensilon test, demonstrating that the patient is on the ascending portion of  an inverted U-shaped __________ response curve with regard to Mestinon.                                               Deanna Artis. Sharene Skeans, M.D.    [facility]  D:  04/14/2002  T:  04/15/2002  Job:  [id number]

## 2010-06-17 NOTE — Discharge Summary (Signed)
[name], [name]                          ACCOUNT NO.:  [id number]   MEDICAL RECORD NO.:  [id number]                   PATIENT TYPE:  INP   LOCATION:  3301                                 FACILITY:  [facility]   PHYSICIAN:  Salvatore Decent. Dorris Fetch, M.D.         DATE OF BIRTH:  Jan 28, 1956   DATE OF ADMISSION:  12/10/2001  DATE OF DISCHARGE:  12/13/2001                                 DISCHARGE SUMMARY   PRIMARY ADMITTING DIAGNOSIS:  Mediastinal mass.   ADDITIONAL/DISCHARGE DIAGNOSES:  1. Epithelial thymoma.  2. Myasthenia gravis.  3. History of hepatitis B in the past.   PROCEDURE PERFORMED:  Partial median sternotomy and thymectomy.   HISTORY:  The patient is a 55 year-old [name] male who over the past two  to three months had become increasingly weak.  He has had some visual  changes and generalized weakness.  He was referred to Dr. Gustavus Messing.  [facility] for evaluation and was felt to have myasthenia gravis. He was  started on steroids and Mestinon therapy as well as intravenous Ig.  During  his workup a CT of the chest was obtained and showed a 2 cm diameter mass in  the superior anterior mediastinum with no evidence of invasion into the  surrounding structures.  He was referred to Dr. Viviann Spare C. Hendrickson for  evaluation and this was felt to represent a thymoma.  After a full  evaluation of the patient, it felt he would benefit from a thymectomy.   HOSPITAL COURSE:  The patient was admitted on December 10, 2001 and taken to  the operating room where he underwent a partial sternotomy and thymectomy.  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  He tolerated the  procedure well and was transferred to the floor in stable condition.  Postoperatively he has done well.  He has remained afebrile and all vital  signs have been stable.  He has been ambulating in the halls without  difficulty.  His chest tubes were removed on postoperative day one.  He has  been  tolerating a regular diet and is having normal bowel and bladder  function. He has been followed while in house by Dr. Orlin Hilding and his  symptoms have been stable. He was also seen in consultation by Dr. Kathrynn Running  of radiation oncology.  It was felt that adjuvant radiotherapy is indicated  to optimize local control of this lesion. He will see the patient once again  in follow-up as an outpatient.  By postoperative day three he was stable and  was ready for discharge home.   DISCHARGE MEDICATIONS:  1. Mestinon 15 mg q.i.d.  2. Prednisone 10 mg two tablets b.i.d.  3. Pepcid 20 mg q.d.  4. Tylox one to two q.4h p.r.n. for pain.   DISCHARGE INSTRUCTIONS:  He is to refrain from driving, heavy lifting or  strenuous activity. He may continue daily walking and use of his incentive  spirometer.  He should shower daily and clean his incision with soap and  water.   FOLLOW UP:  1. He will see Dr. Dorris Fetch in the office in three weeks.  He should have     a chest x-ray at [facility] one hour prior to this appointment and bring his films     to the [facility] office with him.  2. He will also follow-up with Dr. Bethann Goo office in three weeks and they     will schedule this appointment.  3. He has an appointment set up with Dr. Kathrynn Running in radiation oncology on     December 25, 2001 at 2 p.m.     Coral Ceo, P.A.                        Salvatore Decent Dorris Fetch, M.D.    GC/MEDQ  D:  01/06/2002  T:  01/06/2002  Job:  [id number]   cc:   Santina Evans A. Orlin Hilding, M.D.  [address]  [address]  [place]  [place] [address]  Fax: [phone]   Jeanice Lim, M.D.  [address]  [place], [place] [address]  Fax: 1   Sean A. Everardo All, M.D. [facility]

## 2010-06-17 NOTE — H&P (Signed)
NAMEJANTZ, Matthew                        ACCOUNT NO.:  [id number]   MEDICAL RECORD NO.:  [id number]                   PATIENT TYPE:  INP   LOCATION:  0479                                 FACILITY:  [facility]   PHYSICIAN:  Sean A. Everardo All, M.D. [facility]           DATE OF BIRTH:  [birth year]   DATE OF ADMISSION:  04/14/2002  DATE OF DISCHARGE:                                HISTORY & PHYSICAL   REASON FOR ADMISSION:  Severe weakness/exacerbation of his myasthenia  gravis.   HISTORY OF PRESENT ILLNESS:  The patient is a 55 year old man with two weeks  of severe weakness of the neck and upper arms.  He has associated difficulty  swallowing as well as a tremor of his head.  The patient states that he has  had these symptoms for the last two months, but they are much worse over the  past two weeks.   He was recently seen in followup for his myasthenia gravis by [name].  [name] recommended intravenous gamma globulin, but prior authorization  from [facility] was being obtained as of the date of his admission.  [name]  also increased his Mestinon SR to 180 mg q.h.s. and also his immediate-  release Mestinon to 90 mg every four hours.  Finally, [name] increased  his prednisone to 60 mg daily.  It should be noted also that the patient had  been seen in consultation by [facility] recently.   PAST MEDICAL HISTORY:  1. Chronic hepatitis B.  2. Dyslipidemia.   PAST SURGICAL HISTORY:  Thymectomy in 11/2001.   MEDICATIONS:  1. Pepcid 20 mg daily.  2. As noted in the HPI.   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   FAMILY HISTORY:  Negative for myasthenia gravis.   REVIEW OF SYSTEMS:  Slight shortness of breath and slow weight gain, but he  denies fever, chest pain, syncope, rectal bleeding, hematuria, urinary  incontinence, decreased urinary force, seizure, and easy bruising.   PHYSICAL EXAMINATION:  VITAL SIGNS:  Blood  pressure 80/50, heart rate 80,  respiratory rate 20, temperature 97.9, weight 129.  GENERAL:  No distress.  SKIN:  Not diaphoretic.  HEENT:  Head is atraumatic.  Sclerae nonicteric.  Pharynx is clear.  NECK:  Supple.  CHEST:  Clear to auscultation.  CARDIOVASCULAR:  No JVD, no edema, regular rate and rhythm, no murmur.  Pedal pulses are intact.  ABDOMEN:  Soft, nontender, no hepatosplenomegaly, no mass.  GU:  Genital and rectal examination not done at this time due to the  patient's condition.  NEUROLOGIC:  Alert, well-oriented.  Cranial nerves II-XII are intact;  however, the patient has 3/5 muscle strength throughout the head, neck, and  upper extremities.  Sensation is diffusely intact to touch.  The remainder  of motor function is intact.  Gait is observed to be normal.   IMPRESSION:  1. Exacerbation of myasthenia gravis.  2. Hypotension, uncertain clinical significance, if any.  3. Chronic hepatitis B.  4. I am not sure why the patient takes Pepcid AC.   PLAN:  1. Continue outpatient medications for now.  2. Admit to [facility].  3. Consult neurology.  4. Therapeutic options would appear to be transfer to [facility] versus intravenous gamma globulin therapy.                                               Sean A. Everardo All, M.D. [facility]    SAE/MEDQ  D:  04/14/2002  T:  04/14/2002  Job:  [id number]   cc:   Rene Kocher, M.D.  [address]  [place]  [place] [address]  Fax: [phone]   [facility]  [address]  [place], [place] [address]   Jeremy Johann  Department of Neurology  [facility] [address]  [place], [place] [address]

## 2010-06-17 NOTE — Consult Note (Signed)
[name], [name]                        ACCOUNT NO.:  [id number]   MEDICAL RECORD NO.:  [id number]                   PATIENT TYPE:  INP   LOCATION:  0479                                 FACILITY:  [facility]   PHYSICIAN:  Deanna Artis. Sharene Skeans, M.D.           DATE OF BIRTH:  1955-10-15   DATE OF CONSULTATION:  04/14/2002  DATE OF DISCHARGE:                                   CONSULTATION   REASON FOR CONSULTATION:  The patient is a 55 year old right-handed  [name] gentleman seen at [facility] October 31, 2001  through February 13, 2002.  The patient had generalized myasthenia gravis and  presented with eyelid ptosis, weakness in his arms more so than his legs,  and evidence of a thymoma.   The patient's workup prior to this evaluation showed cervical spine with  annular disk bulging at C3-4, C5-6, and C6-7 with no abnormalities in the  spinal cord.  The patient had some sinusitis.   Acetycholine receptor antibodies were drawn and were markedly positive.  CT  scan of the chest showed a 1.8 x 2 cm soft-tissue mass in the superior  mediastinum which turned out to be a thymoma.  This was removed by Dr. Andrey Spearman.  The patient was treated with IVIG and low-dose steroids prior  to the removal, and these were able to be tapered in January.   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  The patient was advised  to seek the care of a full-time neurologist who could participate in  inpatient care.  He has elected Dr. Amelia Jo, who does not provide care  to hospitalized patients.   The patient has had a two- to three-week history of weakness.  This is  increasing in his upper extremity.  It has been associated with tremor,  moving his arms and also his head, which is particularly true towards the  end of the day when he is trying to eat.  The patient has not had true  dysphagia nor has there been change in his voice.  He has had increasing  eyelid ptosis which has made it difficult to read.   The patient has seen [name], and prednisone and Mestinon have been  increased.  Recommendations were made to admit him for treatment with IVIG.  [facility] has failed to provide prior authorization for IVIG despite the fact  that the patient is getting weaker on oral therapy.  The patient was  admitted to the hospital by [name], his primary physician, because  of this weakness.  I was asked to consult on the patient despite the fact  that he is no longer seen by our practice because he is not able to obtain  inpatient care from [name].   PAST MEDICAL HISTORY:  1. Chronic hepatitis B.  2. Dyslipidemia.  PAST SURGICAL HISTORY:  Thymectomy November 2003, Dr. Andrey Spearman.   CURRENT MEDICATIONS:  1. Mestinon 90 mg every four hours.  2. Mestinon SR 180 mg q.h.s.  3. Prednisone 60 mg per day.  4. Pepcid 20 mg per day.  5. Tums 2 per day.  6. Tylox 1-2 every four hours as needed for pain.   DRUG ALLERGIES:  None known.   REVIEW OF SYSTEMS:  Negative for cardiovascular conditions.  Positive for  shortness of breath with exertion.  Negative for anemia, bruisability, or  lymphadenopathy.  Negative for diabetes or thyroid disease.  Negative for  urinary tract infection or hematuria.  Negative for arthralgias or myalgias.  Positive for nocturnal cramping that is related to his Mestinon.  Negative  for skin lesions.  The patient's sleep has been suffering somewhat from  increasing doses of prednisone and from his overall weakness. The patient  has had no ill effects as best he can tell from irradiation of the  mediastinum because of incomplete resection of the thymic mass.   FAMILY HISTORY:  Negative for others with myasthenia gravis.   SOCIAL HISTORY:  The patient is married, lives with his wife.  He has two  children, one daughter and one son.   The patient does not use caffeine,  tobacco, alcohol, or recreational drugs.  He has an associate degree in Engineer, maintenance.  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   PHYSICAL EXAMINATION:  GENERAL:  Well-developed, short-statured man in no  acute distress.  VITAL SIGNS:  Temperature 96.4, blood pressure 1__/71, resting pulse 85,  respirations 16, O2 saturation 96% on room air.  HEENT:  No signs of infection.  Supple neck.  Full range of motion.  No  cranial or cervical bruits.  LUNGS:  Clear to auscultation.  HEART:  No murmurs, pulses normal.  ABDOMEN:  Soft, nontender.  Bowel sounds are present.  He has some  queasiness.  EXTREMITIES:  No edema or cyanosis in his extremities.  NEUROLOGIC:  Patient's mental status:  Awake and alert and that of  appropriate.  No dysphagia or dyspraxia.  Cranial nerve examination:  Round,  reactive pupils.  Visual fields full, fundi normal.  Extraocular movements  full and conjugate.  Bilateral eyelid ptosis right slightly greater than  left.  Symmetric facial strength at rest.  The patient is not able to smile  fully but has a so-called sneer when he smiles.  Midline tongue and uvulae  air conduction greater than bone conduction bilaterally.  Motor examination:  With 4/5 strength in his deltoids, triceps, biceps, wrist extensors and  flexors, 5/5 grip.  Fine motor movements intact.  The right arm is slightly  stronger than the left.  His legs are 5/5 proximally and distally.  Sensation intact to full vibration stereognosis.  Cerebellar examination:  Good finger-to-nose, rapid fine movements.  Gait was not significantly  affected.  He can walk on his heels and toes and perform tandem.  Romberg  response is negative.  Deep tendon reflexes are symmetric and normal.  The  patient has bilateral flexor plantar responses.   PROCEDURE:  I performed a Tensilon test which will be described elsewhere. It improved his strength to 5/5 in all muscle  groups except the left deltoid  and triceps which were 4+/5.  The patient also became queasy.  His blood  pressure dropped from 110/80 with resting pulse 84 to 95/70 with resting  pulse 60.  It rose  back to 110/80 with resting pulse 72.  This was done in a  double-blind fashion.  There was no response to saline either in terms of  side effects or effects.   IMPRESSION:  1. Myasthenia gravis, generalized exacerbation.  2. History of thymoma, question subtotal resection with status post     irradiation.  3. Two- to three-week history of symptoms which may be worse secondary to     withdrawal of the immunosuppressants versus persistent circulating     antibody or both.  4. Positive Tensilon test.   DISCUSSION AND PLAN:  The patient definitely needs IVIG treatment x5 days,  perhaps with monthly boosters for awhile.  This must be done to degrees for  his requirements for prednisone and to improve his strength.  I will discuss  with my partners the appropriate algorithm for treatment.  Unfortunately,  Dr. Cherie Ouch notes were not on the chart.   For now, would continue to provide prednisone 60 mg per day, Mestinon as  ordered, Pepcid to prevent steroid-induced gastritis.  The patient should  have a bedside swallowing study.  I do not think he will need to have a  modified barium swallow at this time.  Also, negative inspiratory force and  forced vital capacity daily.  The patient is not on medications that would  interfere with neuromuscular transmission.  He is also not ill with an  infectious illness.   Imaging his chest should be done to look for recurrent thymoma.  I suspect  that spiral CT will be most appropriate because that was used in the past.  It should be done without and with contrast, and Dr. Dorris Fetch should be  consulted to see if he agrees with checking for a lesion now given that the  patient is weaker.  I will follow up at a distance.  Tensilon test tells [place]  that the  patient is on the ascending portion of an inverted U-shaped dose  response curve.  Too much Mestinon can cause weakness by allowing  acetylcholine to remain at the neuromuscular junction, thus depolarizing the  muscle cells and weakening them.  If the patient condition worsens either by  somatic weakness, true dysphagia, or deterioration of his forced vital  capacity negative inspiratory force, he should be transferred to [facility] for further care.  If you have questions about  this or I can be of assistance, do not hesitate to contact me.                                               Deanna Artis. Sharene Skeans, M.D.    [facility]  D:  04/14/2002  T:  04/15/2002  Job:  [id number]   cc:   Gregary Signs A. Everardo All, M.D. [facility]   Rene Kocher, M.D.  [address]  [place]  [place] [address]  Fax: [phone]   Jeremy Johann, M.D.  Department of Neurology  [facility]  [address]  [place], [place] [address]

## 2010-06-17 NOTE — Discharge Summary (Signed)
[name], [name]                        ACCOUNT NO.:  [id number]   MEDICAL RECORD NO.:  [id number]                   PATIENT TYPE:  INP   LOCATION:  0479                                 FACILITY:  [facility]   PHYSICIAN:  Rene Paci, M.D. [facility]          DATE OF BIRTH:  [birth year]   DATE OF ADMISSION:  04/14/2002  DATE OF DISCHARGE:  04/15/2002                                 DISCHARGE SUMMARY   DISCHARGE DIAGNOSES:  1. Myasthenia gravis exacerbation failing aggressive outpatient medical     management.  2. Shortness of breath with any exertion secondary to above.  NIF negative     30, FVC 2 L.  3. History of thymoma, status post resection November 2003.  4. Decreased thyroid stimulating hormone.  5. Elevated glucose, likely secondary to steroids.  6. History of chronic hepatitis B, not yet evaluated by hepatologist.   DISCHARGE MEDICATIONS:  Are as per his hospitalization and as follows:  1. Prednisone 60 mg p.o. daily.  2. Mestinon 180 mg p.o. q.h.s.  3. Mestinon 90 mg q.i.d.  4. Synergen 12.5-25 mg p.o. q.6h. p.r.n. nausea.  5. Heparin 5000 units subcutaneous b.i.d. for DVT prophylaxis.   DISPOSITION:  The patient is being transferred to [facility] for  further evaluation and treatment of his accelerated myasthenia gravis.  He  is currently stable from a respiratory standpoint and agrees to this  transfer.  He is being accepted by Dr. Rosalyn Gess, who is a neuromuscular  Fellow at [facility] with Dr. Jeremy Johann.   CONDITION ON DISCHARGE:  Guarded but currently stable.   BRIEF HOSPITAL COURSE:  #1 - MYASTHENIA GRAVIS EXACERBATION:  The patient is  a 55 year old [name] man diagnosed with myasthenia gravis in November  2003, who has had a progressive decline of his disease most recently over  the last two weeks.  His outpatient medical regimen has been followed by Dr.  Clarisse Gouge, who has been aggressively increasing his prednisone and Mestinon  doses without much  symptom relief.  His current dose as reflected above has  been prescribed for one week preceding admission.  On the day of admission  he presented to his primary care physician's office with complaints of  continuing weakness of his neck and upper arms.  He complained of difficulty  swallowing as well as tremor of his head and hands.  These symptoms have  been presented over the last two months but worsened in the last two weeks.  He also experienced increasing shortness of breath, worst four days prior to  admission but still described as severe with any exertion such as taking a  shower or moving to the restroom or walking about.  He was admitted on the  day of admission for further evaluation with a swallowing study and  breathing parameters.  He was also seen in consultation by neurology  service, though a different group than that which follows him as an  outpatient.  Dr. Ellison Carwin of [facility] evaluated the  patient on the day of admission and felt that the patient would benefit from  IVIG therapy for five days, perhaps with monthly booster for awhile to  decrease his requirements for prednisone and improving his strength.  However, it is unclear the protocol by which to obtain this treatment for  the patient given difficulties with his insurance and prior authorization.  It was recommended for the time being to continue his medications as prior  to admission with close monitoring of his NIF and FVC.  The following  morning his values were negative inspiratory force of -30 and an FVC of 2 L.  Because of concerns over appropriateness of his care and further evaluation,  a telephone call was made to [facility] where he had been referred but  not yet seen on an outpatient basis at the neuromuscular specialty clinic  run by Dr. Jeremy Johann.  After speaking with the neurology Fellow, Dr.  Rosalyn Gess, plans were made to transfer the patient to their care pending bed   availability at their facility.  A swallowing study was performed prior to  transfer by speech therapy.  This study showed no signs of aspiration or  dysphagia but felt the need to further assess after a full meal to check for  fatigue factors.  He is continued on a mechanical soft diet at this time  pending further evaluation at [facility]   #2 - DEPRESSED THYROID STIMULATING HORMONE:  The patient had a TSH level of  0.149, previously 0.4 in November 2003, as an outpatient.  A free T3, T4 has  been ordered, but the results are not yet available.  I defer any further  management or changes of this to his primary care physician and/or [facility]  physicians.   #3 - HYPERGLYCEMIA:  The patient had a glucose level of 252 this morning.  This is likely secondary to his steroid use, and sliding scale insulin was  initiated.  No previous history of diabetes.   LABORATORY DATA AT THE TIME OF TRANSFER:  White count of 6.8, hemoglobin  16.1, platelets of 182.  Sodium 139, potassium 4.5, chloride 102,  bicarbonate 28, BUN 14, creatinine 1.2, glucose 252.  LFTs normal with the  exception of ALT of 48.  UA negative except for glucose.  TSH 0.149.   COMMENTS:  Pager number [phone].                                               Rene Paci, M.D. [facility]    VL/MEDQ  D:  04/15/2002  T:  04/15/2002  Job:  [id number]

## 2010-06-17 NOTE — Discharge Summary (Signed)
NAMETAFT, WORTHING              ACCOUNT NO.:  [id number]   MEDICAL RECORD NO.:  [id number]          PATIENT TYPE:  INP   LOCATION:  5041                         FACILITY:  [facility]   PHYSICIAN:  Sean A. Everardo All, M.D. [facility] OF BIRTH:  [birth year]   DATE OF ADMISSION:  03/27/2005  DATE OF DISCHARGE:  04/01/2005                                 DISCHARGE SUMMARY   REASON FOR ADMISSION:  Exacerbation of myasthenia gravis.   HISTORY OF PRESENT ILLNESS:  A 55 year old man, whom I had admitted on  March 27, 2005, with an exacerbation of his myasthenia gravis.  Please  refer to my dictated History and Physical for details.   HOSPITAL COURSE:  The patient was admitted and Neurology was consulted.  He  was given intravenous steroids, which were tapered during his  hospitalization.  His Mestinon was continued at the increased dosage  prescribed on admission.  On this, he steadily improved and stated he was  about 40% improved by the time of his discharge.  He is discharged home on  April 01, 2005, in fair condition.   He also had steroid-induced diabetes and was given p.r.n. insulin.  He was  given a prescription for Metformin at the time of discharge.   He was seen in consultation by Speech Therapy, who recommended a dysphagia-3  diet for now.  This could be revisited in the future.   MEDICATIONS ON DISCHARGE:  1.  Prednisone 60 mg every other day.  2.  Mestinon 60 mg 3 times a day.  3.  Metformin Extended Release 500 mg twice a day.  4.  Elavil 25 mg q.h.s.  5.  Calcium 500 mg, 3 tablets daily.   ACTIVITY:  No specific restriction on activity except he needs to take it  easy until he feels better.   DIET:  Dysphagia-3.   FOLLOWUP:  Follow up with me, Dr. Everardo All, within a week, and also he will  call Dr. Georgina Pillion, his neurologist, to schedule followup there.           ______________________________  Cleophas Dunker. Everardo All, M.D. [facility]     SAE/MEDQ  D:  04/01/2005  T:  04/02/2005  Job:   [id number]

## 2010-06-30 ENCOUNTER — Other Ambulatory Visit: Payer: Self-pay | Admitting: Gastroenterology

## 2010-06-30 DIAGNOSIS — B181 Chronic viral hepatitis B without delta-agent: Secondary | ICD-10-CM

## 2010-07-07 ENCOUNTER — Other Ambulatory Visit: Payer: Self-pay | Admitting: Gastroenterology

## 2010-07-07 ENCOUNTER — Ambulatory Visit (INDEPENDENT_AMBULATORY_CARE_PROVIDER_SITE_OTHER): Payer: Medicare Other | Admitting: Gastroenterology

## 2010-07-07 ENCOUNTER — Ambulatory Visit (HOSPITAL_COMMUNITY)
Admission: RE | Admit: 2010-07-07 | Discharge: 2010-07-07 | Disposition: A | Discharge status: Home / Self Care | Payer: Medicare Other | Source: Ambulatory Visit | Attending: Gastroenterology | Admitting: Gastroenterology

## 2010-07-07 VITALS — BP 116/89 | HR 101 | Temp 97.2°F | Ht 65.0 in | Wt 132.0 lb

## 2010-07-07 DIAGNOSIS — B1910 Unspecified viral hepatitis B without hepatic coma: Secondary | ICD-10-CM | POA: Insufficient documentation

## 2010-07-07 DIAGNOSIS — B181 Chronic viral hepatitis B without delta-agent: Secondary | ICD-10-CM

## 2010-07-07 LAB — HEPATIC FUNCTION PANEL
AST: 32 U/L (ref 0–37)
Alkaline Phosphatase: 48 U/L (ref 39–117)
Bilirubin, Direct: 0.1 mg/dL (ref 0.0–0.3)
Indirect Bilirubin: 0.7 mg/dL (ref 0.0–0.9)
Total Bilirubin: 0.8 mg/dL (ref 0.3–1.2)

## 2010-07-08 LAB — HEPATITIS B E ANTIGEN: Hepatitis Be Antigen: NONREACTIVE

## 2010-07-08 LAB — HEPATITIS B SURFACE ANTIGEN: Hepatitis B Surface Ag: POSITIVE — AB

## 2010-07-11 LAB — HEPATITIS B DNA, ULTRAQUANTITATIVE, PCR

## 2010-07-21 ENCOUNTER — Encounter: Payer: Self-pay | Admitting: Gastroenterology

## 2010-07-21 NOTE — Progress Notes (Signed)
NAME:  [name], [name]    MR#:  [id number]      DATE:  07/07/2010  DOB:  06-Dec-1955    cc: Consulting Physician:  Jeremy Johann, MD, Neurologic [facility], [address], [place], [place] [address], Fax [phone] Referring Physician:  Nelwyn Salisbury, MD, [facility], [address] [place], [place], [place] [address], Fax [phone]    Reason for visit:  Followup of E antibody positive HBV.   history:  The patient returns today unaccompanied. Since last being seen on 04/07/2010, there have been no incidents related to his chronic  hepatitis B. There are no symptoms to suggest decompensated liver disease or viral mediated vasculitis.    Past medical history:  Significant for myasthenia gravis. He continues on prednisone but reports today that his dose has been reduced to 5 mg daily. He also has a history of diabetes treated with oral hypoglycemics.    Current medications:  Prednisone 5 mg p.o. daily, metformin 1000 mg p.o. b.i.d., entecavir 1 mg p.o. daily, fluoxetine 20 mg p.o. daily, Zolpidem 10 mg p.o. at bedtime, colestipol 5 mg p.o. daily, alendronate 70 mg p.o. weekly,  vitamin D 2000 units p.o. daily, pyridostigmine bromide 60 mg p.o. daily, omeprazole 20 mg p.o. daily, calcium 600 mg p.o. t.i.d., Extra Strength Tylenol 1000 mg p.o. p.r.n.   allergies:  Denies.   habits:  Smoking, none.  Alcohol denies interval consumption.    Review of systems:  All 10 systems were reviewed today with the patient and they are negative other than which was mentioned above. His CES-D was 24.   Physical examination:   Constitutional:  Well appearing. Vital signs:  Height 65 inches, weight 132 pounds, blood pressure 116/89, pulse 101, temperature 97.2 Fahrenheit. Ears, nose, mouth and throat:  Unremarkable oropharynx.   No thyromegaly or neck masses.  Chest:  Resonant to percussion.  Clear to auscultation.  Cardiovascular:  Heart sounds normal S1, S2 without murmurs or rubs.  There is no  peripheral edema.  Abdominal:  Normal  bowel sounds.  No masses or tenderness.  I could not appreciate a liver edge or spleen tip.  I could not appreciate any hernias.  Lymphatics:  No cervical or inguinal lymphadenopathy.  Central Nervous  System:  No asterixis or focal neurologic findings.  Dermatologic:  Anicteric without palmar erythema or spider angiomata.  Eyes:  Anicteric sclerae.  Pupils are equal and reactive to light.   Laboratories:  On 04/07/2010; Hepatitis B surface antigen was positive. His hepatitis Be antigen was negative, and E antibody was positive. His HBV DNA was detectable but not quantifiable. His ALT was 41.   Ultrasound was done today and is pending.    assessment:  The patient is a 55 year old gentleman with history of E antibody positive HBV who is currently suppressed on entecavir.  A biopsy, 10/30/2002, showed grade zero to 1 stage zero disease. He has been on  treatment with the entecavir since at least 11/23/2005, with suppression of his HBV. I note that he is down on a lower dose of prednisone now but provided he remains on prednisone, there will  especially be a need to continue on entecavir.   In terms of HBV care, he is hepatitis A immune. He is already undergoing screening for hepatocellular cancer with semiannual ultrasounds, which was done today.   In my discussion today with the patient, I reviewed his previous lab tests with him and discussed his course on therapy for his HBV.   plan:  1. Await ultrasound results today. 2. Hepatitis A immune. 3. Standard labs for HBV on therapy.  4. Return in 6 months time with ultrasound the same day.            Brooke Dare, MD   ADDENDUM:  HBV DNA undetectable.    The ultrasound showed an area of focal hypoechogenicity not seen on prior study near the gallbladder  fossa. Question focal fatty sparing versus focal lesion.  Will order an MRI.   [phone]  D:  Thu Jun 07 13:27:06 2012 ; T:  Thu Jun 07 14:22:31  2012  Job #:  [id number]

## 2010-07-25 ENCOUNTER — Other Ambulatory Visit: Payer: Self-pay | Admitting: Endocrinology

## 2010-07-28 ENCOUNTER — Other Ambulatory Visit: Payer: Self-pay | Admitting: Endocrinology

## 2010-08-02 ENCOUNTER — Other Ambulatory Visit: Payer: Self-pay | Admitting: Gastroenterology

## 2010-08-02 DIAGNOSIS — B1910 Unspecified viral hepatitis B without hepatic coma: Secondary | ICD-10-CM

## 2010-08-11 ENCOUNTER — Inpatient Hospital Stay (HOSPITAL_COMMUNITY): Admission: RE | Admit: 2010-08-11 | Payer: Medicare Other | Source: Ambulatory Visit

## 2010-08-18 ENCOUNTER — Ambulatory Visit (HOSPITAL_COMMUNITY)
Admission: RE | Admit: 2010-08-18 | Discharge: 2010-08-18 | Disposition: A | Discharge status: Home / Self Care | Payer: Medicare Other | Source: Ambulatory Visit | Attending: Gastroenterology | Admitting: Gastroenterology

## 2010-08-18 DIAGNOSIS — B1910 Unspecified viral hepatitis B without hepatic coma: Secondary | ICD-10-CM | POA: Insufficient documentation

## 2010-08-18 DIAGNOSIS — K7689 Other specified diseases of liver: Secondary | ICD-10-CM | POA: Insufficient documentation

## 2010-08-18 DIAGNOSIS — R932 Abnormal findings on diagnostic imaging of liver and biliary tract: Secondary | ICD-10-CM | POA: Insufficient documentation

## 2010-08-18 LAB — BUN: BUN: 17 mg/dL (ref 6–23)

## 2010-08-18 LAB — CREATININE, SERUM
Creatinine, Ser: 0.87 mg/dL (ref 0.50–1.35)
GFR calc Af Amer: >60 mL/min (ref >60)
GFR calc non Af Amer: >60 mL/min (ref >60)

## 2010-08-18 MED ORDER — GADOBENATE DIMEGLUMINE 529 MG/ML IV SOLN
12.0000 mL | Freq: Once | INTRAVENOUS | Status: AC
  Administered 2010-08-18: 12 mL via INTRAVENOUS

## 2010-08-24 ENCOUNTER — Encounter: Payer: Self-pay | Admitting: Endocrinology

## 2010-08-24 ENCOUNTER — Other Ambulatory Visit (INDEPENDENT_AMBULATORY_CARE_PROVIDER_SITE_OTHER): Payer: Medicare Other

## 2010-08-24 ENCOUNTER — Other Ambulatory Visit: Payer: Self-pay | Admitting: Endocrinology

## 2010-08-24 ENCOUNTER — Ambulatory Visit (INDEPENDENT_AMBULATORY_CARE_PROVIDER_SITE_OTHER): Payer: Medicare Other | Admitting: Endocrinology

## 2010-08-24 DIAGNOSIS — E079 Disorder of thyroid, unspecified: Secondary | ICD-10-CM

## 2010-08-24 DIAGNOSIS — E119 Type 2 diabetes mellitus without complications: Secondary | ICD-10-CM

## 2010-08-24 DIAGNOSIS — F329 Major depressive disorder, single episode, unspecified: Secondary | ICD-10-CM

## 2010-08-24 DIAGNOSIS — E785 Hyperlipidemia, unspecified: Secondary | ICD-10-CM

## 2010-08-24 DIAGNOSIS — F3289 Other specified depressive episodes: Secondary | ICD-10-CM

## 2010-08-24 DIAGNOSIS — Z125 Encounter for screening for malignant neoplasm of prostate: Secondary | ICD-10-CM | POA: Insufficient documentation

## 2010-08-24 DIAGNOSIS — B181 Chronic viral hepatitis B without delta-agent: Secondary | ICD-10-CM

## 2010-08-24 LAB — BASIC METABOLIC PANEL
CO2: 30 mEq/L (ref 19–32)
Calcium: 9.8 mg/dL (ref 8.4–10.5)
GFR: 93.43 mL/min (ref >60.00)
Sodium: 141 mEq/L (ref 135–145)

## 2010-08-24 LAB — CBC WITH DIFFERENTIAL/PLATELET
Basophils Absolute: 0 10*3/uL (ref 0.0–0.1)
Basophils Relative: 0.2 % (ref 0.0–3.0)
Hemoglobin: 16.7 g/dL (ref 13.0–17.0)
Lymphocytes Relative: 14.9 % (ref 12.0–46.0)
Monocytes Relative: 3.5 % (ref 3.0–12.0)
Neutro Abs: 4.8 10*3/uL (ref 1.4–7.7)
RBC: 5.38 Mil/uL (ref 4.22–5.81)

## 2010-08-24 LAB — HEPATIC FUNCTION PANEL
AST: 38 U/L — ABNORMAL HIGH (ref 0–37)
Albumin: 4.8 g/dL (ref 3.5–5.2)
Alkaline Phosphatase: 43 U/L (ref 39–117)
Total Protein: 7.5 g/dL (ref 6.0–8.3)

## 2010-08-24 LAB — MICROALBUMIN / CREATININE URINE RATIO
Creatinine,U: 192.4 mg/dL
Microalb Creat Ratio: 0.9 mg/g (ref 0.0–30.0)

## 2010-08-24 LAB — HEMOGLOBIN A1C: Hgb A1c MFr Bld: 6 % (ref 4.6–6.5)

## 2010-08-24 LAB — URINALYSIS, ROUTINE W REFLEX MICROSCOPIC
Bilirubin Urine: NEGATIVE
Ketones, ur: NEGATIVE
Leukocytes, UA: NEGATIVE
Specific Gravity, Urine: 1.02 (ref 1.000–1.030)
Urobilinogen, UA: 0.2 (ref 0.0–1.0)

## 2010-08-24 LAB — LIPID PANEL
Total CHOL/HDL Ratio: 5
VLDL: 78 mg/dL — ABNORMAL HIGH (ref 0.0–40.0)

## 2010-08-24 MED ORDER — FLUOCINONIDE 0.05 % EX OINT: 1.0000 "application " | TOPICAL_OINTMENT | Freq: Every day | CUTANEOUS | Status: DC | PRN

## 2010-08-24 MED ORDER — CEFUROXIME AXETIL 250 MG PO TABS: 250.0000 mg | ORAL_TABLET | Freq: Two times a day (BID) | ORAL | Status: AC

## 2010-08-24 NOTE — Patient Instructions (Addendum)
i have sent a prescription to your pharmacy Please make a medicare wellness appointment, after august 8. i have sent a prescription to your pharmacy, for an antibiotic. blood tests are being ordered for you today.  please call [phone] to hear your test results.  You will be prompted to enter the 9-digit "MRN" number that appears at the top left of this page, followed by #.  Then you will hear the message.

## 2010-08-24 NOTE — Progress Notes (Signed)
Subjective:    Patient ID: [name], male    DOB: 04-Apr-1955, 55 y.o.   MRN: [id number]  HPI Pt states few days of slight pain at the right ear, and assoc "noise" there.  He also has nasal congestion.   no cbg record, but states cbg's are well-controlled. Past Medical History  Diagnosis Date  . 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 Insomnia, unspecified   . Myasthenia gravis without exacerbation   . Viral hepatitis B without mention of hepatic coma, chronic, without mention of hepatitis delta   . Other and unspecified hyperlipidemia   . Type II or unspecified type diabetes mellitus without mention of complication, not stated as uncontrolled   . Depressive disorder, not elsewhere classified   . Anxiety   . Internal hemorrhoids   . External hemorrhoid   . Colon polyp     Past Surgical History  Procedure Date  . Thymectomy     History   Social History  . Marital Status: Married    Spouse Name: N/A    Number of Children: N/A  . Years of Education: N/A   Occupational History  . Not on file.   Social History Main Topics  . Smoking status: Never Smoker   . Smokeless tobacco: Not on file  . Alcohol Use: No  . Drug Use: No  . Sexually Active:    Other Topics Concern  . Not on file   Social History Narrative  . No narrative on file    Current Outpatient Prescriptions on File Prior to Visit  Medication Sig Dispense Refill  . acetaminophen (TYLENOL) 500 MG tablet Take 1,000 mg by mouth as needed.        Marland Kitchen alendronate (FOSAMAX) 70 MG tablet TAKE 1 TABLET EVERY WEEK  4 tablet  5  . calcium carbonate (OS-CAL) 600 MG TABS Take 600 mg by mouth 3 (three) times daily with meals.        . Cholecalciferol (VITAMIN D) 2000 UNITS tablet Take 2,000 Units by mouth daily.        Marland Kitchen entecavir (BARACLUDE) 1 MG tablet Take 1 mg by mouth daily.        . fluocinonide (LIDEX) 0.05 % ointment Apply 1 application topically daily as needed.        Marland Kitchen FLUoxetine (PROZAC)  20 MG tablet Take 20 mg by mouth as needed.       Marland Kitchen glucose blood (ONE TOUCH ULTRA TEST) test strip Use as instructed once daily      . hydrocortisone 2.5 % cream APPLY TO AFFECTED AREA FOUR TIMES A DAY FOR RASH AS NEEDED  56 g  2  . Lancets (ONETOUCH ULTRASOFT) lancets Use as instructed once daily      . metFORMIN (GLUCOPHAGE) 500 MG tablet Take 2 tablets (1,000 mg total) by mouth 2 (two) times daily with a meal.  360 tablet  3  . MICRONIZED COLESTIPOL HCL 1 G tablet TAKE 5 TABLETS BY MOUTH EVERY DAY AS DIRECTED  150 tablet  5  . omeprazole (PRILOSEC) 20 MG capsule Take 20 mg by mouth daily.        . predniSONE (DELTASONE) 5 MG tablet Take 5 mg by mouth 1 dose over 24 hours.        . pyridostigmine (MESTINON) 60 MG tablet Take 60 mg by mouth as needed.       . zolpidem (AMBIEN) 10 MG tablet Take 10 mg by mouth at bedtime as needed.  Allergies  Allergen Reactions  . Azithromycin     REACTION: exac of his mg  . Beta Adrenergic Blockers     REACTION: exac of mg   No family history on file.  BP 118/84  Pulse 101  Temp(Src) 98.8 F (37.1 C) (Oral)  Ht 5\' 5"  (1.651 m)  Wt 132 lb (59.875 kg)  BMI 21.97 kg/m2  SpO2 98%  Review of Systems Denies fever and ear drainage.    Objective:   Physical Exam GENERAL: no distress Ears: both tm's are red.  eac's are normal.    Assessment & Plan:  bilat aom, new Allergic rhinitis, recurrent Type 2 dm, apparently well-controlled

## 2010-09-21 ENCOUNTER — Encounter: Payer: Self-pay | Admitting: Endocrinology

## 2010-09-21 ENCOUNTER — Ambulatory Visit (INDEPENDENT_AMBULATORY_CARE_PROVIDER_SITE_OTHER): Payer: Medicare Other | Admitting: Endocrinology

## 2010-09-21 VITALS — BP 120/88 | HR 94 | Temp 98.8°F | Ht 65.0 in | Wt 133.0 lb

## 2010-09-21 DIAGNOSIS — E119 Type 2 diabetes mellitus without complications: Secondary | ICD-10-CM

## 2010-09-21 DIAGNOSIS — Z23 Encounter for immunization: Secondary | ICD-10-CM

## 2010-09-21 DIAGNOSIS — Z0000 Encounter for general adult medical examination without abnormal findings: Secondary | ICD-10-CM

## 2010-09-21 NOTE — Patient Instructions (Signed)
please consider these measures for your health:  minimize alcohol.  do not use tobacco products.  have a colonoscopy at least every 10 years from age 55.   keep firearms safely stored.  always use seat belts.  have working smoke alarms in your home.  see an eye doctor and dentist regularly.  never drive under the influence of alcohol or drugs (including prescription drugs).  those with fair skin should take precautions against the sun. please let me know what your wishes would be, if artificial life support measures should become necessary.  it is critically important to prevent falling down (keep floor areas well-lit, dry, and free of loose objects) Please make a follow-up appointment in 6 months.

## 2010-09-21 NOTE — Progress Notes (Signed)
Subjective:    Patient ID: [name], male    DOB: 1955/02/20, 55 y.o.   MRN: [id number]  HPI Subjective:   Patient here for Medicare annual wellness visit and management of other chronic and acute problems.  Risk factors: multiple med probs   Copy Providing Medical Care to Patient: Neurol: [facility] ([facility]) Opthal: pt does not recall Derm: gruber Hepatology: zack  Activities of Daily Living: In your present state of health, do you have any difficulty performing the following activities?:  Preparing food and eating?: No  Bathing yourself: No  Getting dressed: No  Using the toilet:No  Moving around from place to place: No  In the past year have you fallen or had a near fall?: No    Home Safety: Has smoke detector and wears seat belts. No firearms. No excess sun exposure.  Diet and Exercise  Current exercise habits:  Pt says very good--walking Dietary issues discussed: pt reports a healthy diet   Depression Screen  Q1: Over the past two weeks, have you felt down, depressed or hopeless? He says depression is well-controlled. Q2: Over the past two weeks, have you felt little interest or pleasure in doing things? no   The following portions of the patient's history were reviewed and updated as appropriate: allergies, current medications, past family history, past medical history, past social history, past surgical history and problem list.  Past Medical History  Diagnosis Date  . 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 Insomnia, unspecified   . Myasthenia gravis without exacerbation   . Viral hepatitis B without mention of hepatic coma, chronic, without mention of hepatitis delta   . Other and unspecified hyperlipidemia   . Type II or unspecified type diabetes mellitus without mention of complication, not stated as uncontrolled   . Depressive disorder, not elsewhere classified   . Anxiety   . Internal hemorrhoids   . External hemorrhoid   .  Colon polyp     Past Surgical History  Procedure Date  . Thymectomy     History   Social History  . Marital Status: Married    Spouse Name: N/A    Number of Children: N/A  . Years of Education: N/A   Occupational History  . Not on file.   Social History Main Topics  . Smoking status: Never Smoker   . Smokeless tobacco: Not on file  . Alcohol Use: No  . Drug Use: No  . Sexually Active:    Other Topics Concern  . Not on file   Social History Narrative  . No narrative on file    Current Outpatient Prescriptions on File Prior to Visit  Medication Sig Dispense Refill  . acetaminophen (TYLENOL) 500 MG tablet Take 1,000 mg by mouth as needed.        Marland Kitchen alendronate (FOSAMAX) 70 MG tablet TAKE 1 TABLET EVERY WEEK  4 tablet  5  . calcium carbonate (OS-CAL) 600 MG TABS Take 600 mg by mouth 3 (three) times daily with meals.        . Cholecalciferol (VITAMIN D) 2000 UNITS tablet Take 2,000 Units by mouth daily.        Marland Kitchen entecavir (BARACLUDE) 1 MG tablet Take 1 mg by mouth daily.        . fluocinonide (LIDEX) 0.05 % ointment Apply 1 application topically daily as needed.  30 g  3  . FLUoxetine (PROZAC) 20 MG tablet Take 20 mg by mouth as needed.       Marland Kitchen  glucose blood (ONE TOUCH ULTRA TEST) test strip Use as instructed once daily      . hydrocortisone 2.5 % cream APPLY TO AFFECTED AREA FOUR TIMES A DAY FOR RASH AS NEEDED  56 g  2  . Lancets (ONETOUCH ULTRASOFT) lancets Use as instructed once daily      . metFORMIN (GLUCOPHAGE) 500 MG tablet Take 2 tablets (1,000 mg total) by mouth 2 (two) times daily with a meal.  360 tablet  3  . MICRONIZED COLESTIPOL HCL 1 G tablet TAKE 5 TABLETS BY MOUTH EVERY DAY AS DIRECTED  150 tablet  5  . omeprazole (PRILOSEC) 20 MG capsule TAKE ONE CAPSULE BY MOUTH EVERY DAY  30 capsule  5  . predniSONE (DELTASONE) 5 MG tablet Take 5 mg by mouth 1 dose over 24 hours.        . pyridostigmine (MESTINON) 60 MG tablet Take 60 mg by mouth as needed.       .  zolpidem (AMBIEN) 10 MG tablet Take 10 mg by mouth at bedtime as needed.          Allergies  Allergen Reactions  . Azithromycin     REACTION: exac of his mg  . Beta Adrenergic Blockers     REACTION: exac of mg    No family history on file.  BP 120/88  Pulse 94  Temp(Src) 98.8 F (37.1 C) (Oral)  Ht 5\' 5"  (1.651 m)  Wt 133 lb (60.328 kg)  BMI 22.13 kg/m2  SpO2 98%   Review of Systems  Denies hearing loss, and visual loss Objective:   Vision:  Sees opthalmologist Hearing: grossly normal Body mass index:  See vs page Msk: pt easily and quickly performs "get-up-and-go" from a sitting position Cognitive Impairment Assessment: cognition, memory and judgment appear normal.  remembers 3/3 at 5 minutes.  excellent recall.  can easily read and write a sentence.  alert and oriented x 3   Assessment:   Medicare wellness utd on preventive parameters    Plan:   During the course of the visit the patient was educated and counseled about appropriate screening and preventive services including:       Fall prevention   Screening mammography  Bone densitometry screening  Diabetes screening  Nutrition counseling   Vaccines / LABS Zostavax / Pnemonccoal Vaccine  today  PSA  Patient Instructions (the written plan) was given to the patient.        Review of Systems     Objective:   Physical Exam Pulses: dorsalis pedis intact bilat.   Feet: no deformity.  no ulcer on the feet.  feet are of normal color and temp.  no edema Neuro: sensation is intact to touch on the feet.       Assessment & Plan:

## 2010-09-22 DIAGNOSIS — Z119 Encounter for screening for infectious and parasitic diseases, unspecified: Secondary | ICD-10-CM | POA: Insufficient documentation

## 2010-10-19 ENCOUNTER — Other Ambulatory Visit: Payer: Self-pay | Admitting: Endocrinology

## 2010-11-04 LAB — COMPREHENSIVE METABOLIC PANEL
Alkaline Phosphatase: 30 — ABNORMAL LOW
BUN: 13
CO2: 22
GFR calc non Af Amer: >60
Glucose, Bld: 163 — ABNORMAL HIGH
Potassium: 3.5
Total Bilirubin: 0.8
Total Protein: 6

## 2010-11-04 LAB — URINE MICROSCOPIC-ADD ON

## 2010-11-04 LAB — URINALYSIS, ROUTINE W REFLEX MICROSCOPIC
Leukocytes, UA: NEGATIVE
Nitrite: NEGATIVE
Protein, ur: NEGATIVE
Specific Gravity, Urine: 1.028
Urobilinogen, UA: 0.2

## 2010-11-04 LAB — DIFFERENTIAL
Basophils Absolute: 0
Basophils Relative: 0
Monocytes Relative: 3
Neutro Abs: 10.3 — ABNORMAL HIGH
Neutrophils Relative %: 92 — ABNORMAL HIGH

## 2010-11-04 LAB — CBC
HCT: 49.7
Hemoglobin: 17.1 — ABNORMAL HIGH
RDW: 12.5

## 2010-11-15 ENCOUNTER — Other Ambulatory Visit: Payer: Self-pay | Admitting: Endocrinology

## 2010-12-15 ENCOUNTER — Other Ambulatory Visit: Payer: Self-pay | Admitting: Endocrinology

## 2011-01-09 ENCOUNTER — Other Ambulatory Visit: Payer: Self-pay | Admitting: Endocrinology

## 2011-01-12 ENCOUNTER — Ambulatory Visit (INDEPENDENT_AMBULATORY_CARE_PROVIDER_SITE_OTHER): Payer: Medicare Other | Admitting: Gastroenterology

## 2011-01-12 DIAGNOSIS — B181 Chronic viral hepatitis B without delta-agent: Secondary | ICD-10-CM

## 2011-01-12 LAB — HEPATIC FUNCTION PANEL
ALT: 44 U/L (ref 0–53)
Bilirubin, Direct: 0.2 mg/dL (ref 0.0–0.3)
Total Bilirubin: 1 mg/dL (ref 0.3–1.2)

## 2011-01-13 LAB — HEPATITIS B SURFACE ANTIBODY,QUALITATIVE: Hep B S Ab: NEGATIVE

## 2011-01-13 LAB — HEPATITIS B SURFACE ANTIGEN: Hepatitis B Surface Ag: POSITIVE — AB

## 2011-01-14 ENCOUNTER — Other Ambulatory Visit: Payer: Self-pay | Admitting: Endocrinology

## 2011-01-16 LAB — HEPATITIS B DNA, ULTRAQUANTITATIVE, PCR: Hepatitis B DNA (Calc): <116 copies/mL (ref <116)

## 2011-01-16 LAB — HEPATITIS B E ANTIGEN: Hepatitis Be Antigen: NEGATIVE

## 2011-01-19 NOTE — Progress Notes (Signed)
NAMEJOSEMARIA, BRINING    MR#:  [id number]      DATE:  01/12/2011  DOB:  1955-11-03    cc:  Consulting Physician: Jeremy Johann, MD, Neurologic [facility], [address], [place], [place] [address], Fax [phone]   Referring Physician: Nelwyn Salisbury, MD, [facility], [address] [place], [place], [place] [address], Fax [phone]     REASON FOR VISIT:  Follow up of E antibody positive hepatitis B virus.    HISTORY:  The patient returns today unaccompanied. Since last being seen on July 07, 2010, there have been no interval symptoms to suggest active or decompensated hepatitis B. There are no symptoms of vasculitis.    Past medical history:  1. Significant for myasthenia gravis. He continues on low-dose prednisone. He reports that there has been no change in this.  2. He also has a history of type 2 diabetes, on oral hypoglycemics.  CURRENT MEDICATIONS:  1. Prednisone 5 mg daily. 2. Metformin 1000 mg b.i.d. 3. Entecavir 1 mg daily. 4. Fluoxetine 20 mg daily. 5. Zolpidem 10 mg at bedtime. 6. Colestipol 5 mg daily. 7. Alendronate 70 mg weekly. 8. Vitamin D 2000 units daily. 9. Pyridostigmine bromide 60 mg daily. 10. Omeprazole 20 mg daily. 11. Calcium 600 mg t.i.d.  12. Extra-Strength Tylenol 1000 mg p.r.n.   Allergies:  Denies.    Habits:  Smoking: Denies. Alcohol:  Denies interval consumption.   REVIEW OF SYSTEMS:  All 10 systems reviewed today with the patient and are negative other than that which is mentioned above. His CES-D was 27.   PHYSICAL EXAMINATION:  Constitutional:  Well-appearing. Vital signs:  Height 65 inches, weight 132 pounds.  Blood pressure 142/99, pulse 90, temperature 98.  Ears, nose, mouth and throat:  Unremarkable oropharynx.  No  thyromegaly or neck masses.  Chest:  Resonant to percussion.  Clear to auscultation.  Cardiovascular:  Heart sounds normal S1, S2 without murmurs or rubs.  There is no peripheral edema.  Abdominal:  Normal  bowel  sounds.  No masses or tenderness.  I could not appreciate a liver edge or spleen tip.  I could not appreciate any hernias.  Lymphatics:  No cervical or inguinal lymphadenopathy.  Central Nervous  System:  No asterixis or focal neurologic findings.  Dermatologic:  Anicteric without palmar erythema or spider angiomata.  Eyes:  Anicteric sclerae.  Pupils are equal and reactive to light.   LABORATORY data:  Laboratory work from 07/07/2010 revealed an ALT of 38. Hepatitis B surface antigen was positive. His hepatitis B surface antibody was  negative. His BDNA was undetectable. His E antibody was positive, E antigen was negative.   IMAGING STUDIES:  Most recent imaging of the liver was by ultrasound sound on 07/07/2010, which showed an area of focal fatty sparing on the background of an echogenic appearing liver.  An MRI was suggested,  which was performed on 08/18/2010, which showed focal fatty sparing in the region around the gallbladder fossa, which correlated with the finding on the ultrasound.   Assessment:  The patient is a 55 year old gentleman with a history of E antibody positive hepatitis B virus which is currently suppressed on entecavir.  Biopsy 10/30/2002 showed grade 0-1, stage zero disease. He has been on  treatment with entecavir since at least 11/23/2005, with suppression of his hepatitis B virus.  He probably has an element of steatohepatitis from a number of factors, including the use of  prednisone. Fortunately, this does not appear to be progressive.  In terms of hepatitis B virus, he carries hepatitis A immune. He is already undergoing screening for hepatocellular cancer with a semiannual ultrasound, but because he underwent the MRI for the focal  fat on 08/18/2010, I think it would be reasonable to defer to do another ultrasound by July 2013.   In my discussion today with the patient we discussed his course to date. I reviewed the previous lab work with him.   PLAN:  1. Start  ultrasound again in 6 months' time, around June or July 2013. 2. Hepatitis A immune. 3. Standard laboratories for hepatitis B virus on therapy. 4. Return to clinic in 6 months' time with an ultrasound the same day, having omitted an ultrasound at the December appointment because the MRI had been done in July 2013.            Brooke Dare, MD   ADDENDUM ALT 44,  HBV DNA detectable but < 20 IU/mL,  e Ab positive.  403 .[id number]  D:  Thu Dec 13 18:11:10 2012 ; T:  Fri Dec 14 17:43:15 2012  Job #:  [id number]

## 2011-02-13 ENCOUNTER — Other Ambulatory Visit: Payer: Self-pay | Admitting: Endocrinology

## 2011-03-24 ENCOUNTER — Other Ambulatory Visit (INDEPENDENT_AMBULATORY_CARE_PROVIDER_SITE_OTHER): Payer: Medicare Other

## 2011-03-24 ENCOUNTER — Ambulatory Visit (INDEPENDENT_AMBULATORY_CARE_PROVIDER_SITE_OTHER): Payer: Medicare Other | Admitting: Endocrinology

## 2011-03-24 ENCOUNTER — Encounter: Payer: Self-pay | Admitting: Endocrinology

## 2011-03-24 VITALS — BP 122/84 | HR 118 | Temp 98.4°F | Ht 62.5 in | Wt 136.0 lb

## 2011-03-24 DIAGNOSIS — E119 Type 2 diabetes mellitus without complications: Secondary | ICD-10-CM

## 2011-03-24 MED ORDER — CEFUROXIME AXETIL 250 MG PO TABS: 250.0000 mg | ORAL_TABLET | Freq: Two times a day (BID) | ORAL | Status: AC

## 2011-03-24 NOTE — Patient Instructions (Signed)
i have sent a prescription to your pharmacy, for an antibiotic I hope you feel better soon.  If you don't feel better by next week, please call back. blood tests are being requested for you today.  please call [phone] to hear your test results.  You will be prompted to enter the 9-digit "MRN" number that appears at the top left of this page, followed by #.  Then you will hear the message.

## 2011-03-24 NOTE — Progress Notes (Signed)
Subjective:    Patient ID: [name], male    DOB: Sep 29, 1955, 56 y.o.   MRN: [id number]  HPI Pt states 3 weeks of intermittent moderate headache, worst at the bitemporal and bioccipital areas.  He has assoc nasal congestion, but no rhinorrhea.  He has had headache before, but not recently.   Past Medical History  Diagnosis Date  . 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 Insomnia, unspecified   . Myasthenia gravis without exacerbation   . Viral hepatitis B without mention of hepatic coma, chronic, without mention of hepatitis delta   . Other and unspecified hyperlipidemia   . Type II or unspecified type diabetes mellitus without mention of complication, not stated as uncontrolled   . Depressive disorder, not elsewhere classified   . Anxiety   . Internal hemorrhoids   . External hemorrhoid   . Colon polyp     Past Surgical History  Procedure Date  . Thymectomy     History   Social History  . Marital Status: Married    Spouse Name: N/A    Number of Children: N/A  . Years of Education: N/A   Occupational History  . Not on file.   Social History Main Topics  . Smoking status: Never Smoker   . Smokeless tobacco: Not on file  . Alcohol Use: No  . Drug Use: No  . Sexually Active:    Other Topics Concern  . Not on file   Social History Narrative  . No narrative on file    Current Outpatient Prescriptions on File Prior to Visit  Medication Sig Dispense Refill  . acetaminophen (TYLENOL) 500 MG tablet Take 1,000 mg by mouth as needed.        Marland Kitchen alendronate (FOSAMAX) 70 MG tablet TAKE 1 TABLET EVERY WEEK  4 tablet  5  . calcium carbonate (OS-CAL) 600 MG TABS Take 600 mg by mouth 3 (three) times daily with meals.        . Cholecalciferol (VITAMIN D) 2000 UNITS tablet Take 2,000 Units by mouth daily.        Marland Kitchen entecavir (BARACLUDE) 1 MG tablet Take 1 mg by mouth daily.        . fluocinonide ointment (LIDEX) 0.05 % APPLY TO AFFECTED AREA EVERY DAY AS  NEEDED  30 g  3  . FLUoxetine (PROZAC) 20 MG tablet Take 20 mg by mouth as needed.       Marland Kitchen glucose blood (ONE TOUCH ULTRA TEST) test strip Use as instructed once daily      . hydrocortisone 2.5 % cream APPLY TO AFFECTED AREA FOUR TIMES A DAY FOR RASH AS NEEDED  56 g  3  . Lancets (ONETOUCH ULTRASOFT) lancets Use as instructed once daily      . metFORMIN (GLUCOPHAGE) 500 MG tablet Take 2 tablets (1,000 mg total) by mouth 2 (two) times daily with a meal.  360 tablet  3  . MICRONIZED COLESTIPOL HCL 1 G tablet TAKE 5 TABLETS BY MOUTH EVERY DAY AS DIRECTED  150 tablet  5  . omeprazole (PRILOSEC) 20 MG capsule TAKE ONE CAPSULE BY MOUTH EVERY DAY  30 capsule  8  . predniSONE (DELTASONE) 5 MG tablet Take 5 mg by mouth 1 dose over 24 hours.        . pyridostigmine (MESTINON) 60 MG tablet Take 60 mg by mouth as needed.       . zolpidem (AMBIEN) 10 MG tablet Take 10 mg by mouth  at bedtime as needed.         Allergies  Allergen Reactions  . Azithromycin     REACTION: exac of his mg  . Beta Adrenergic Blockers     REACTION: exac of mg    No family history on file.  BP 122/84  Pulse 118  Temp(Src) 98.4 F (36.9 C) (Oral)  Ht 5' 2.5" (1.588 m)  Wt 136 lb (61.689 kg)  BMI 24.48 kg/m2  SpO2 96%    Review of Systems Denies visual loss and fever.      Objective:   Physical Exam VITAL SIGNS:  See vs page GENERAL: no distress Both tm's are red CN 2-12 intact bilaterally Neck: supple  Lab Results  Component Value Date   HGBA1C 6.2 03/24/2011      Assessment & Plan:  Headache, possibly due to bilat AOM.  New Type 2 DM, well-controlled

## 2011-05-15 ENCOUNTER — Other Ambulatory Visit: Payer: Self-pay | Admitting: Endocrinology

## 2011-05-31 DIAGNOSIS — G7000 Myasthenia gravis without (acute) exacerbation: Secondary | ICD-10-CM | POA: Diagnosis not present

## 2011-06-12 DIAGNOSIS — L80 Vitiligo: Secondary | ICD-10-CM | POA: Diagnosis not present

## 2011-06-12 DIAGNOSIS — L219 Seborrheic dermatitis, unspecified: Secondary | ICD-10-CM | POA: Diagnosis not present

## 2011-06-12 DIAGNOSIS — L259 Unspecified contact dermatitis, unspecified cause: Secondary | ICD-10-CM | POA: Diagnosis not present

## 2011-06-13 ENCOUNTER — Other Ambulatory Visit: Payer: Self-pay | Admitting: Endocrinology

## 2011-07-07 ENCOUNTER — Other Ambulatory Visit: Payer: Self-pay | Admitting: Endocrinology

## 2011-07-13 ENCOUNTER — Ambulatory Visit (INDEPENDENT_AMBULATORY_CARE_PROVIDER_SITE_OTHER): Payer: Medicare Other | Admitting: Gastroenterology

## 2011-07-13 DIAGNOSIS — B181 Chronic viral hepatitis B without delta-agent: Secondary | ICD-10-CM | POA: Diagnosis not present

## 2011-07-13 MED ORDER — ENTECAVIR 1 MG PO TABS: 1.0000 mg | ORAL_TABLET | Freq: Every day | ORAL | Status: AC

## 2011-07-14 LAB — HEPATIC FUNCTION PANEL
ALT: 28 U/L (ref 0–53)
AST: 29 U/L (ref 0–37)
Alkaline Phosphatase: 49 U/L (ref 39–117)
Indirect Bilirubin: 1.1 mg/dL — ABNORMAL HIGH (ref 0.0–0.9)
Total Protein: 7.4 g/dL (ref 6.0–8.3)

## 2011-07-14 LAB — HEPATITIS B SURFACE ANTIBODY,QUALITATIVE: Hep B S Ab: NEGATIVE

## 2011-07-17 ENCOUNTER — Ambulatory Visit (HOSPITAL_COMMUNITY)
Admission: RE | Admit: 2011-07-17 | Discharge: 2011-07-17 | Disposition: A | Discharge status: Home / Self Care | Payer: Medicare Other | Source: Ambulatory Visit | Attending: Gastroenterology | Admitting: Gastroenterology

## 2011-07-17 DIAGNOSIS — B181 Chronic viral hepatitis B without delta-agent: Secondary | ICD-10-CM | POA: Diagnosis not present

## 2011-07-17 DIAGNOSIS — B1910 Unspecified viral hepatitis B without hepatic coma: Secondary | ICD-10-CM | POA: Diagnosis not present

## 2011-07-18 LAB — HEPATITIS B E ANTIGEN: Hepatitis Be Antigen: NEGATIVE

## 2011-07-19 LAB — HEPATITIS B DNA, ULTRAQUANTITATIVE, PCR
Hepatitis B DNA (Calc): <116 copies/mL (ref <116)
Hepatitis B DNA: <20 IU/mL (ref <20)

## 2011-07-20 NOTE — Progress Notes (Signed)
[name], [name]    MR#:  [id number]      DATE:  07/13/2011  DOB:  [birth year]    cc: Consulting Physician: Jeremy Johann, MD, Neurologic [facility], [address], [place], [place] [address], Fax [phone]  Referring Physician:  Romero Belling, MD, [facility], [address] [place], [place], [place] [address], Fax [phone]    REASON FOR VISIT:  Follow up of E antibody positive hepatitis B.   HISTORY:  The patient returns today unaccompanied. Since last being seen on  01/12/2011, there has been no interval symptoms to suggest active or decompensated hepatitis B. There are no symptoms of vasculitis.   PAST MEDICAL HISTORY: Significant for myasthenia gravis. He continues on low dose prednisone for the same. He also has a history of type 2 diabetes on oral hypoglycemics.   CURRENT MEDICATIONS:  1. Prednisone 5 mg daily.  2. Metformin 1000 mg b.i.d.  3. Entecavir 1 mg daily.  4. Fluoxetine 20 mg daily.  5. Zolpidem 10 mg at bedtime.  6. Colestipol 5 mg daily.  7. Alendronate 70 mg weekly.  8. Vitamin D 2000 units daily.  9. Pyridostigmine bromide 60 mg daily.  10. Omeprazole 20 mg daily.  11. Calcium 600 mg t.i.d.  12. Extra strength Tylenol 1000 mg p.r.n.    HABITS:  Denies, smoking. Alcohol denies interval consumption.   REVIEW OF SYSTEMS:  All 10 systems reviewed today with the patient and they are negative other than which was mentioned above. His CES-D was 29.   PHYSICAL EXAMINATION:   Constitutional: Well appearing without stigmata of chronic liver disease. Vital Signs: Height 65 inches, weight 134 pounds,  blood pressure 131/91, pulse of 97, temperature 98.1 Fahrenheit. Ears, Nose, Mouth and Throat:  Unremarkable oropharynx.  No thyromegaly or neck masses.  Chest:  Resonant to percussion.  Clear to auscultation.  Cardiovascular:  Heart sounds normal S1, S2 without murmurs or rubs.  There is no peripheral edema.  Abdomen:  Normal bowel sounds.  No masses or  tenderness.  I could not appreciate a liver edge or spleen tip.  I could not appreciate any hernias.  Lymphatics:  No cervical or inguinal lymphadenopathy.  Central Nervous System:  No asterixis or focal neurologic findings.  Dermatologic:  Anicteric without palmar erythema or spider angiomata.  Eyes:  Anicteric sclerae.  Pupils are equal and reactive to light.  LABORATORIES:  I ordered an ultrasound for his return appointment today, but it was not scheduled. Previous lab work from 01/12/2011, AST 3.2, ALT 44, HBV surface antigen positive, surface antibody negative, E antigen negative, E antibody positive, HBV DNA less than 20 international units per mL.   ASSESSMENT:  The patient is a 56 year old gentleman with history of E antibody positive hepatitis B, which is currently suppressed on entecavir. Biopsy 10/30/2002, showed grade 0-1, stage 0 disease.  He has been on treatment with entecavir since at least 11/23/2005, putting him at approximately 294 weeks or 5 years 7 months 90 days on treatment. His HBV is suppressed. There is likely an element of steatohepatitis from a number of factors including his prednisone and diabetes that correspond with a minor elevation of his liver enzymes.   In terms of hepatitis B virus, he is hepatitis A immune. He is already undergoing screening for hepatocellular cancer with a semiannual ultrasound.   In my discussion today with the patient, we reviewed the results of the previous testing. We discussed obtaining the ultrasound.   PLAN:  1. Ultrasound  to be done in December 2013.  2. Hepatitis A immune.  3. Renewed entecavir at 0.5 mg 30 days supply with 7 refills.  4. Standard labs today including hepatitis B virus on therapy.  5. Return in 6 months' time with an ultrasound at that time.              Brooke Dare, MD   ADDENDUM:  HBV DNA detectable but not quantifiable.  ALT 28   e Ab positive.    403 .[id number]  D:  Thu Jun 13 16:18:19 2013 ; T:  Thu Jun 13  19:50:02 2013  Job #:  [id number]

## 2011-07-27 ENCOUNTER — Encounter: Payer: Self-pay | Admitting: Gastroenterology

## 2011-08-14 ENCOUNTER — Telehealth: Payer: Self-pay | Admitting: *Deleted

## 2011-08-14 MED ORDER — COLESTIPOL HCL 1 G PO TABS
1.0000 g | ORAL_TABLET | Freq: Every day | ORAL | Status: DC
Start: 2011-08-14 — End: 2011-09-18

## 2011-08-14 MED ORDER — OMEPRAZOLE 20 MG PO CPDR
20.0000 mg | DELAYED_RELEASE_CAPSULE | Freq: Every day | ORAL | Status: DC
Start: 2011-08-14 — End: 2012-09-04

## 2011-08-14 MED ORDER — ALENDRONATE SODIUM 70 MG PO TABS: 70.0000 mg | ORAL_TABLET | ORAL | Status: DC

## 2011-08-14 NOTE — Telephone Encounter (Signed)
Sent refills on meds.Marland KitchenMarland Kitchen7/15/13@11 :42am/LMB

## 2011-09-13 ENCOUNTER — Other Ambulatory Visit: Payer: Self-pay | Admitting: Endocrinology

## 2011-09-18 ENCOUNTER — Telehealth: Payer: Self-pay | Admitting: *Deleted

## 2011-09-18 MED ORDER — COLESTIPOL HCL 1 G PO TABS: 5.0000 g | ORAL_TABLET | Freq: Every day | ORAL | Status: DC

## 2011-09-18 NOTE — Telephone Encounter (Signed)
R'cd fax from [facility] for clarification on refill of Colestipol tablets. Rx sent in on 08/14/2011 sig is 1 tablet po qd but pt states and previously rx's sent in say 5 tablets qd-please advise.

## 2011-09-18 NOTE — Telephone Encounter (Signed)
Yes, it is 5/day. i have corrected and resent.

## 2011-09-20 DIAGNOSIS — H26499 Other secondary cataract, unspecified eye: Secondary | ICD-10-CM | POA: Diagnosis not present

## 2011-09-25 ENCOUNTER — Other Ambulatory Visit (INDEPENDENT_AMBULATORY_CARE_PROVIDER_SITE_OTHER): Payer: Medicare Other

## 2011-09-25 ENCOUNTER — Encounter: Payer: Self-pay | Admitting: Endocrinology

## 2011-09-25 ENCOUNTER — Ambulatory Visit (INDEPENDENT_AMBULATORY_CARE_PROVIDER_SITE_OTHER): Payer: Medicare Other | Admitting: Endocrinology

## 2011-09-25 VITALS — BP 118/88 | HR 85 | Temp 99.0°F | Ht 62.5 in | Wt 133.0 lb

## 2011-09-25 DIAGNOSIS — F3289 Other specified depressive episodes: Secondary | ICD-10-CM | POA: Diagnosis not present

## 2011-09-25 DIAGNOSIS — Z79899 Other long term (current) drug therapy: Secondary | ICD-10-CM | POA: Insufficient documentation

## 2011-09-25 DIAGNOSIS — Z125 Encounter for screening for malignant neoplasm of prostate: Secondary | ICD-10-CM

## 2011-09-25 DIAGNOSIS — Z0000 Encounter for general adult medical examination without abnormal findings: Secondary | ICD-10-CM

## 2011-09-25 DIAGNOSIS — M810 Age-related osteoporosis without current pathological fracture: Secondary | ICD-10-CM

## 2011-09-25 DIAGNOSIS — Z23 Encounter for immunization: Secondary | ICD-10-CM | POA: Diagnosis not present

## 2011-09-25 DIAGNOSIS — E7800 Pure hypercholesterolemia, unspecified: Secondary | ICD-10-CM

## 2011-09-25 DIAGNOSIS — F329 Major depressive disorder, single episode, unspecified: Secondary | ICD-10-CM | POA: Diagnosis not present

## 2011-09-25 DIAGNOSIS — E785 Hyperlipidemia, unspecified: Secondary | ICD-10-CM

## 2011-09-25 DIAGNOSIS — E079 Disorder of thyroid, unspecified: Secondary | ICD-10-CM | POA: Diagnosis not present

## 2011-09-25 DIAGNOSIS — E119 Type 2 diabetes mellitus without complications: Secondary | ICD-10-CM

## 2011-09-25 LAB — CBC WITH DIFFERENTIAL/PLATELET
Basophils Absolute: 0 10*3/uL (ref 0.0–0.1)
Eosinophils Relative: 3.6 % (ref 0.0–5.0)
Lymphocytes Relative: 23.1 % (ref 12.0–46.0)
Lymphs Abs: 1.2 10*3/uL (ref 0.7–4.0)
Monocytes Relative: 8.8 % (ref 3.0–12.0)
Neutrophils Relative %: 64 % (ref 43.0–77.0)
Platelets: 170 10*3/uL (ref 150.0–400.0)
RDW: 12.3 % (ref 11.5–14.6)
WBC: 5.2 10*3/uL (ref 4.5–10.5)

## 2011-09-25 LAB — TSH: TSH: 0.85 u[IU]/mL (ref 0.35–5.50)

## 2011-09-25 LAB — BASIC METABOLIC PANEL
BUN: 17 mg/dL (ref 6–23)
CO2: 30 mEq/L (ref 19–32)
Calcium: 9.7 mg/dL (ref 8.4–10.5)
Chloride: 102 mEq/L (ref 96–112)
Creatinine, Ser: 1 mg/dL (ref 0.4–1.5)

## 2011-09-25 LAB — LDL CHOLESTEROL, DIRECT: Direct LDL: 90 mg/dL

## 2011-09-25 LAB — URINALYSIS, ROUTINE W REFLEX MICROSCOPIC
Bilirubin Urine: NEGATIVE
Leukocytes, UA: NEGATIVE
Nitrite: NEGATIVE

## 2011-09-25 LAB — LIPID PANEL
HDL: 42.8 mg/dL (ref >39.00)
Total CHOL/HDL Ratio: 4
Triglycerides: 204 mg/dL — ABNORMAL HIGH (ref 0.0–149.0)

## 2011-09-25 LAB — MICROALBUMIN / CREATININE URINE RATIO: Microalb, Ur: 2.9 mg/dL — ABNORMAL HIGH (ref 0.0–1.9)

## 2011-09-25 NOTE — Progress Notes (Signed)
Subjective:    Patient ID: [name], male    DOB: 01-30-1956, 56 y.o.   MRN: [id number]  HPI The state of at least three ongoing medical problems is addressed today: DM: he denies chest pain Dyslipidemia: he denies sob Depression: he related this to his experience surviving the [place] war in the 1970's.  These sxs are  mild, but he has insomnia Past Medical History  Diagnosis Date  . 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 Insomnia, unspecified   . Myasthenia gravis without exacerbation   . Viral hepatitis B without mention of hepatic coma, chronic, without mention of hepatitis delta   . Other and unspecified hyperlipidemia   . Type II or unspecified type diabetes mellitus without mention of complication, not stated as uncontrolled   . Depressive disorder, not elsewhere classified   . Anxiety   . Internal hemorrhoids   . External hemorrhoid   . Colon polyp     Past Surgical History  Procedure Date  . Thymectomy     History   Social History  . Marital Status: Married    Spouse Name: N/A    Number of Children: N/A  . Years of Education: N/A   Occupational History  . Not on file.   Social History Main Topics  . Smoking status: Never Smoker   . Smokeless tobacco: Not on file  . Alcohol Use: No  . Drug Use: No  . Sexually Active:    Other Topics Concern  . Not on file   Social History Narrative  . No narrative on file    Current Outpatient Prescriptions on File Prior to Visit  Medication Sig Dispense Refill  . acetaminophen (TYLENOL) 500 MG tablet Take 1,000 mg by mouth as needed.        . calcium carbonate (OS-CAL) 600 MG TABS Take 600 mg by mouth 3 (three) times daily with meals.        . Cholecalciferol (VITAMIN D) 2000 UNITS tablet Take 2,000 Units by mouth daily.        . colestipol (COLESTID) 1 G tablet Take 5 tablets (5 g total) by mouth daily.  150 tablet  11  . entecavir (BARACLUDE) 1 MG tablet Take 1 tablet (1 mg total) by mouth  daily.  30 tablet  6  . fluocinonide ointment (LIDEX) 0.05 % APPLY TO AFFECTED AREA EVERY DAY AS NEEDED  30 g  3  . FLUoxetine (PROZAC) 20 MG tablet Take 20 mg by mouth as needed.       Marland Kitchen glucose blood (ONE TOUCH ULTRA TEST) test strip Use as instructed once daily      . hydrocortisone 2.5 % cream APPLY TO AFFECTED AREA FOUR TIMES A DAY FOR RASH AS NEEDED  56 g  3  . Lancets (ONETOUCH ULTRASOFT) lancets Use as instructed once daily      . metFORMIN (GLUCOPHAGE) 500 MG tablet TAKE 2 TABLETS BY MOUTH TWICE A DAY WITH A MEAL  360 tablet  1  . omeprazole (PRILOSEC) 20 MG capsule Take 1 capsule (20 mg total) by mouth daily.  30 capsule  5  . predniSONE (DELTASONE) 5 MG tablet Take 5 mg by mouth 1 dose over 24 hours.        . pyridostigmine (MESTINON) 60 MG tablet Take 60 mg by mouth as needed.       . zolpidem (AMBIEN) 10 MG tablet Take 10 mg by mouth at bedtime as needed.  Allergies  Allergen Reactions  . Azithromycin     REACTION: exac of his mg  . Beta Adrenergic Blockers     REACTION: exac of mg    No family history on file.  BP 118/88  Pulse 85  Temp 99 F (37.2 C) (Oral)  Ht 5' 2.5" (1.588 m)  Wt 133 lb (60.328 kg)  BMI 23.94 kg/m2  SpO2 99%   Review of Systems He has intermittent headache.  He says heartburn is well-controlled.    Objective:   Physical Exam VITAL SIGNS:  See vs page GENERAL: no distress Pulses: dorsalis pedis intact bilat.   Feet: no deformity.  no ulcer on the feet.  feet are of normal color and temp.  no edema.   Neuro: sensation is intact to touch on the feet.   Lab Results  Component Value Date   WBC 5.2 09/25/2011   HGB 16.1 09/25/2011   HCT 48.0 09/25/2011   PLT 170.0 09/25/2011   GLUCOSE 115* 09/25/2011   CHOL 169 09/25/2011   TRIG 204.0* 09/25/2011   HDL 42.80 09/25/2011   LDLDIRECT 90.0 09/25/2011   LDLCALC 68 07/23/2008   ALT 28 07/13/2011   AST 29 07/13/2011   NA 142 09/25/2011   K 4.2 09/25/2011   CL 102 09/25/2011   CREATININE 1.0  09/25/2011   BUN 17 09/25/2011   CO2 30 09/25/2011   TSH 0.85 09/25/2011   PSA 0.87 09/25/2011   HGBA1C 5.9 09/25/2011   MICROALBUR 2.9* 09/25/2011      Assessment & Plan:  DM is well-controlled Dyslipidemia: well-controlled Depression, well-controlled   Subjective:   Patient here for Medicare annual wellness visit and management of other chronic and acute problems.     Risk factors: multiple med probs    Copy Providing Medical Care to Patient:  See "snapshot"   Activities of Daily Living: In your present state of health, do you have any difficulty performing the following activities?:  Preparing food and eating?: No  Bathing yourself: No  Getting dressed: No  Using the toilet:No  Moving around from place to place: No  In the past year have you fallen or had a near fall?:No    Home Safety: Has smoke detector and wears seat belts. No firearms. No excess sun exposure.  Diet and Exercise  Current exercise habits: limited by MG Dietary issues discussed: pt reports a healthy diet   Depression Screen  Q1: Over the past two weeks, have you felt down, depressed or hopeless?no  Q2: Over the past two weeks, have you felt little interest or pleasure in doing things? no   The following portions of the patient's history were reviewed and updated as appropriate: allergies, current medications, past family history, past medical history, past social history, past surgical history and problem list.   Review of Systems  Denies hearing loss, and visual loss Objective:   Vision:  Sees opthalmologist Hearing: grossly normal Body mass index:  See vs page Msk: pt easily and quickly performs "get-up-and-go" from a sitting position Cognitive Impairment Assessment: cognition, memory and judgment appear normal.  remembers 3/3 at 5 minutes.  excellent recall.  can easily read and write a sentence.  alert and oriented x 3.     Assessment:   Medicare wellness utd on preventive  parameters    Plan:   During the course of the visit the patient was educated and counseled about appropriate screening and preventive services including:  Fall prevention    Bone densitometry screening  Diabetes screening  Nutrition counseling   Vaccines / LABS Zostavax / Pnemonccoal Vaccine  Today. PSA  Patient Instructions (the written plan) was given to the patient.

## 2011-09-25 NOTE — Patient Instructions (Addendum)
good diet and exercise habits significanly improve the control of your diabetes.  please let me know if you wish to be referred to a dietician.  high blood sugar is very risky to your health.  you should see an eye doctor every year.   please consider these measures for your health:  minimize alcohol.  do not use tobacco products.  have a colonoscopy at least every 10 years from age 55. keep firearms safely stored.  always use seat belts.  have working smoke alarms in your home.  see an eye doctor and dentist regularly.  never drive under the influence of alcohol or drugs (including prescription drugs).   blood tests are being requested for you today.  You will receive a letter with results.

## 2011-09-26 ENCOUNTER — Encounter: Payer: Self-pay | Admitting: Endocrinology

## 2011-09-26 LAB — PSA, MEDICARE: PSA: 0.87 ng/ml (ref 0.10–4.00)

## 2011-09-27 ENCOUNTER — Telehealth: Payer: Self-pay | Admitting: *Deleted

## 2011-09-27 NOTE — Telephone Encounter (Signed)
Called pt to inform of lab results, pt informed (letter also mailed to pt). 

## 2011-10-11 ENCOUNTER — Other Ambulatory Visit: Payer: Self-pay | Admitting: Endocrinology

## 2011-10-30 ENCOUNTER — Ambulatory Visit (INDEPENDENT_AMBULATORY_CARE_PROVIDER_SITE_OTHER): Payer: Medicare Other | Admitting: Endocrinology

## 2011-10-30 ENCOUNTER — Encounter: Payer: Self-pay | Admitting: Endocrinology

## 2011-10-30 VITALS — BP 120/78 | HR 117 | Temp 98.4°F | Resp 16 | Wt 133.2 lb

## 2011-10-30 DIAGNOSIS — L568 Other specified acute skin changes due to ultraviolet radiation: Secondary | ICD-10-CM | POA: Insufficient documentation

## 2011-10-30 MED ORDER — CEFUROXIME AXETIL 250 MG PO TABS: 250.0000 mg | ORAL_TABLET | Freq: Two times a day (BID) | ORAL | Status: AC

## 2011-10-30 NOTE — Patient Instructions (Addendum)
i have sent a prescription to your pharmacy, for an antibiotic pill I hope you feel better soon.  If you don't feel better by next week, please call back.   Refer to your eye specialist.  you will receive a phone call, about a day and time for an appointment.

## 2011-10-30 NOTE — Progress Notes (Signed)
Subjective:    Patient ID: [name], male    DOB: 09/30/1955, 56 y.o.   MRN: [id number]  HPI Pt states few weeks of moderate pain at both ears, and assoc sensitivity to light.   Past Medical History  Diagnosis Date  . 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 Insomnia, unspecified   . Myasthenia gravis without exacerbation   . Viral hepatitis B without mention of hepatic coma, chronic, without mention of hepatitis delta   . Other and unspecified hyperlipidemia   . Type II or unspecified type diabetes mellitus without mention of complication, not stated as uncontrolled   . Depressive disorder, not elsewhere classified   . Anxiety   . Internal hemorrhoids   . External hemorrhoid   . Colon polyp     Past Surgical History  Procedure Date  . Thymectomy     History   Social History  . Marital Status: Married    Spouse Name: N/A    Number of Children: N/A  . Years of Education: N/A   Occupational History  . Not on file.   Social History Main Topics  . Smoking status: Never Smoker   . Smokeless tobacco: Not on file  . Alcohol Use: No  . Drug Use: No  . Sexually Active:    Other Topics Concern  . Not on file   Social History Narrative  . No narrative on file    Current Outpatient Prescriptions on File Prior to Visit  Medication Sig Dispense Refill  . acetaminophen (TYLENOL) 500 MG tablet Take 1,000 mg by mouth as needed.        . calcium carbonate (OS-CAL) 600 MG TABS Take 600 mg by mouth 3 (three) times daily with meals.        . Cholecalciferol (VITAMIN D) 2000 UNITS tablet Take 2,000 Units by mouth daily.        . colestipol (COLESTID) 1 G tablet Take 5 tablets (5 g total) by mouth daily.  150 tablet  11  . entecavir (BARACLUDE) 1 MG tablet Take 1 tablet (1 mg total) by mouth daily.  30 tablet  6  . fluocinonide ointment (LIDEX) 0.05 % APPLY TO AFFECTED AREA EVERY DAY AS NEEDED  30 g  3  . FLUoxetine (PROZAC) 20 MG tablet Take 20 mg by mouth as  needed.       Marland Kitchen glucose blood (ONE TOUCH ULTRA TEST) test strip Use as instructed once daily      . hydrocortisone 2.5 % cream APPLY TO AFFECTED AREA FOUR TIMES A DAY FOR RASH AS NEEDED  56 g  3  . Lancets (ONETOUCH ULTRASOFT) lancets Use as instructed once daily      . metFORMIN (GLUCOPHAGE) 500 MG tablet TAKE 2 TABLETS BY MOUTH TWICE A DAY WITH A MEAL  360 tablet  1  . omeprazole (PRILOSEC) 20 MG capsule Take 1 capsule (20 mg total) by mouth daily.  30 capsule  5  . predniSONE (DELTASONE) 5 MG tablet Take 5 mg by mouth 1 dose over 24 hours.        . pyridostigmine (MESTINON) 60 MG tablet Take 60 mg by mouth as needed.       . zolpidem (AMBIEN) 10 MG tablet Take 10 mg by mouth at bedtime as needed.          Allergies  Allergen Reactions  . Azithromycin     REACTION: exac of his mg  . Beta Adrenergic Blockers  REACTION: exac of mg    No family history on file.  BP 120/78  Pulse 117  Temp 98.4 F (36.9 C) (Oral)  Resp 16  Wt 133 lb 4 oz (60.442 kg)  SpO2 96%  Review of Systems He also has headache, but no fever.    Objective:   Physical Exam VITAL SIGNS:  See vs page GENERAL: no distress head: no deformity eyes: no periorbital swelling, no proptosis external nose and ears are normal mouth: no lesion seen Both tm's are red.       Assessment & Plan:

## 2011-11-01 DIAGNOSIS — G7000 Myasthenia gravis without (acute) exacerbation: Secondary | ICD-10-CM | POA: Diagnosis not present

## 2011-12-13 ENCOUNTER — Other Ambulatory Visit: Payer: Self-pay | Admitting: Endocrinology

## 2012-01-04 ENCOUNTER — Ambulatory Visit (HOSPITAL_COMMUNITY)
Admission: RE | Admit: 2012-01-04 | Discharge: 2012-01-04 | Disposition: A | Discharge status: Home / Self Care | Payer: Medicare Other | Source: Ambulatory Visit | Attending: Gastroenterology | Admitting: Gastroenterology

## 2012-01-04 DIAGNOSIS — K7689 Other specified diseases of liver: Secondary | ICD-10-CM | POA: Insufficient documentation

## 2012-01-04 DIAGNOSIS — B181 Chronic viral hepatitis B without delta-agent: Secondary | ICD-10-CM

## 2012-01-04 DIAGNOSIS — B1910 Unspecified viral hepatitis B without hepatic coma: Secondary | ICD-10-CM | POA: Diagnosis not present

## 2012-01-10 ENCOUNTER — Other Ambulatory Visit: Payer: Self-pay | Admitting: Endocrinology

## 2012-02-05 DIAGNOSIS — B181 Chronic viral hepatitis B without delta-agent: Secondary | ICD-10-CM | POA: Diagnosis not present

## 2012-02-06 ENCOUNTER — Other Ambulatory Visit: Payer: Self-pay | Admitting: *Deleted

## 2012-02-06 MED ORDER — METFORMIN HCL 500 MG PO TABS: 500.0000 mg | ORAL_TABLET | Freq: Two times a day (BID) | ORAL | Status: DC

## 2012-02-14 DIAGNOSIS — K7689 Other specified diseases of liver: Secondary | ICD-10-CM | POA: Diagnosis not present

## 2012-03-06 ENCOUNTER — Ambulatory Visit (INDEPENDENT_AMBULATORY_CARE_PROVIDER_SITE_OTHER): Payer: Medicare Other | Admitting: Endocrinology

## 2012-03-06 VITALS — BP 122/68 | HR 125 | Temp 97.8°F | Wt 131.0 lb

## 2012-03-06 DIAGNOSIS — R51 Headache: Secondary | ICD-10-CM

## 2012-03-06 NOTE — Progress Notes (Signed)
Subjective:    Patient ID: [name], male    DOB: [birth year], 56 y.o.   MRN: [id number]  HPI Pt states few weeks og slight headache, worst at left side of the head, and assoc lightheadedness.   He says in general he is too tired to do jury duty.  He brings a jury summons for yesterday, saying he forgot due to medical conditions.   Past Medical History  Diagnosis Date  . 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 Insomnia, unspecified   . Myasthenia gravis without exacerbation   . Viral hepatitis B without mention of hepatic coma, chronic, without mention of hepatitis delta   . Other and unspecified hyperlipidemia   . Type II or unspecified type diabetes mellitus without mention of complication, not stated as uncontrolled   . Depressive disorder, not elsewhere classified   . Anxiety   . Internal hemorrhoids   . External hemorrhoid   . Colon polyp     Past Surgical History  Procedure Date  . Thymectomy     History   Social History  . Marital Status: Married    Spouse Name: N/A    Number of Children: N/A  . Years of Education: N/A   Occupational History  . Not on file.   Social History Main Topics  . Smoking status: Never Smoker   . Smokeless tobacco: Not on file  . Alcohol Use: No  . Drug Use: No  . Sexually Active:    Other Topics Concern  . Not on file   Social History Narrative  . No narrative on file    Current Outpatient Prescriptions on File Prior to Visit  Medication Sig Dispense Refill  . acetaminophen (TYLENOL) 500 MG tablet Take 1,000 mg by mouth as needed.        Marland Kitchen ampicillin (PRINCIPEN) 500 MG capsule       . calcium carbonate (OS-CAL) 600 MG TABS Take 600 mg by mouth 3 (three) times daily with meals.        . Cholecalciferol (VITAMIN D) 2000 UNITS tablet Take 2,000 Units by mouth daily.        . colestipol (COLESTID) 1 G tablet Take 5 tablets (5 g total) by mouth daily.  150 tablet  11  . entecavir (BARACLUDE) 1 MG tablet Take 1  tablet (1 mg total) by mouth daily.  30 tablet  6  . fluocinonide ointment (LIDEX) 0.05 % APPLY TO AFFECTED AREA EVERY DAY AS NEEDED  30 g  3  . FLUoxetine (PROZAC) 20 MG tablet Take 20 mg by mouth as needed.       . hydrocortisone 2.5 % cream APPLY TO AFFECTED AREA FOUR TIMES A DAY FOR RASH AS NEEDED  56 g  3  . ketoconazole (NIZORAL) 2 % cream       . Lancets (ONETOUCH ULTRASOFT) lancets USE AS DIRECTED  100 each  3  . metFORMIN (GLUCOPHAGE) 500 MG tablet Take 1 tablet (500 mg total) by mouth 2 (two) times daily with a meal.  360 tablet  1  . omeprazole (PRILOSEC) 20 MG capsule Take 1 capsule (20 mg total) by mouth daily.  30 capsule  5  . ONE TOUCH ULTRA TEST test strip USE AS DIRECTED  100 each  3  . predniSONE (DELTASONE) 5 MG tablet Take 10 mg by mouth 1 day or 1 dose.       . pyridostigmine (MESTINON) 60 MG tablet Take 60 mg by mouth as  needed.       . Sulfacetamide Sodium, Acne, 10 % LOTN       . zolpidem (AMBIEN) 10 MG tablet Take 10 mg by mouth at bedtime as needed.          Allergies  Allergen Reactions  . Azithromycin     REACTION: exac of his mg  . Beta Adrenergic Blockers     REACTION: exac of mg    No family history on file.  BP 122/68  Pulse 125  Temp 97.8 F (36.6 C) (Oral)  Wt 131 lb (59.421 kg)  SpO2 97%  Review of Systems He has insomnia.      Objective:   Physical Exam VITAL SIGNS:  See vs page GENERAL: no distress head: no deformity eyes: no periorbital swelling, no proptosis external nose and ears are normal mouth: no lesion seen Both eac's and tm's are normal PSYCH: Alert and oriented x 3.  Does not appear anxious nor depressed.    Assessment & Plan:  Headache, and other sxs, uncertain etiology.  In view of his MG and meds, he'll need to see his neurologist for this.  i don't see a condition that would prevent him from doing jury duty.

## 2012-03-06 NOTE — Patient Instructions (Addendum)
Please see your neurologist for these symptoms.  As forgetfulness is a new symptom, please report that to your neurologist as well.

## 2012-04-09 ENCOUNTER — Other Ambulatory Visit: Payer: Self-pay | Admitting: *Deleted

## 2012-04-09 MED ORDER — OMEPRAZOLE 20 MG PO CPDR: 20.0000 mg | DELAYED_RELEASE_CAPSULE | Freq: Every day | ORAL | Status: DC

## 2012-06-05 DIAGNOSIS — G7000 Myasthenia gravis without (acute) exacerbation: Secondary | ICD-10-CM | POA: Diagnosis not present

## 2012-07-11 DIAGNOSIS — G7000 Myasthenia gravis without (acute) exacerbation: Secondary | ICD-10-CM | POA: Diagnosis not present

## 2012-07-11 DIAGNOSIS — B1910 Unspecified viral hepatitis B without hepatic coma: Secondary | ICD-10-CM | POA: Diagnosis not present

## 2012-07-11 DIAGNOSIS — B181 Chronic viral hepatitis B without delta-agent: Secondary | ICD-10-CM | POA: Diagnosis not present

## 2012-08-19 DIAGNOSIS — L708 Other acne: Secondary | ICD-10-CM | POA: Diagnosis not present

## 2012-08-19 DIAGNOSIS — L219 Seborrheic dermatitis, unspecified: Secondary | ICD-10-CM | POA: Diagnosis not present

## 2012-09-04 ENCOUNTER — Other Ambulatory Visit: Payer: Self-pay

## 2012-09-04 MED ORDER — COLESTIPOL HCL 1 G PO TABS: 5.0000 g | ORAL_TABLET | Freq: Every day | ORAL | Status: DC

## 2012-09-04 MED ORDER — OMEPRAZOLE 20 MG PO CPDR: 20.0000 mg | DELAYED_RELEASE_CAPSULE | Freq: Every day | ORAL | Status: DC

## 2012-09-09 ENCOUNTER — Telehealth: Payer: Self-pay | Admitting: Endocrinology

## 2012-09-09 MED ORDER — METFORMIN HCL 500 MG PO TABS: 500.0000 mg | ORAL_TABLET | Freq: Two times a day (BID) | ORAL | Status: DC

## 2012-09-09 NOTE — Telephone Encounter (Signed)
Ok, 8 am

## 2012-09-09 NOTE — Telephone Encounter (Signed)
rx refill

## 2012-09-11 ENCOUNTER — Encounter: Payer: Self-pay | Admitting: Endocrinology

## 2012-09-11 ENCOUNTER — Ambulatory Visit (INDEPENDENT_AMBULATORY_CARE_PROVIDER_SITE_OTHER): Payer: Medicare Other | Admitting: Endocrinology

## 2012-09-11 VITALS — BP 122/80 | HR 76 | Ht 65.0 in | Wt 131.0 lb

## 2012-09-11 DIAGNOSIS — E119 Type 2 diabetes mellitus without complications: Secondary | ICD-10-CM | POA: Diagnosis not present

## 2012-09-11 MED ORDER — CEFUROXIME AXETIL 250 MG PO TABS: 250.0000 mg | ORAL_TABLET | Freq: Two times a day (BID) | ORAL | Status: AC

## 2012-09-11 NOTE — Patient Instructions (Addendum)
Please come in after 09/24/12, for a "medicare wellness" visit. i have sent a prescription to your pharmacy, for an antibiotic pill Loratadine-d (non-prescription) will help your congestion. I hope you feel better soon.  If you don't feel better by next week, please call back.

## 2012-09-11 NOTE — Progress Notes (Signed)
Subjective:    Patient ID: [name], male    DOB: 12-08-1955, 57 y.o.   MRN: [id number]  HPI Pt states 1 week of slight dry-quality cough in the chest, and assoc nasal congestion.   Pt says cbg's are well-controlled.  Past Medical History  Diagnosis Date  . 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 Insomnia, unspecified   . Myasthenia gravis without exacerbation   . Viral hepatitis B without mention of hepatic coma, chronic, without mention of hepatitis delta   . Other and unspecified hyperlipidemia   . Type II or unspecified type diabetes mellitus without mention of complication, not stated as uncontrolled   . Depressive disorder, not elsewhere classified   . Anxiety   . Internal hemorrhoids   . External hemorrhoid   . Colon polyp     Past Surgical History  Procedure Laterality Date  . Thymectomy      History   Social History  . Marital Status: Married    Spouse Name: N/A    Number of Children: N/A  . Years of Education: N/A   Occupational History  . Not on file.   Social History Main Topics  . Smoking status: Never Smoker   . Smokeless tobacco: Not on file  . Alcohol Use: No  . Drug Use: No  . Sexual Activity:    Other Topics Concern  . Not on file   Social History Narrative  . No narrative on file    Current Outpatient Prescriptions on File Prior to Visit  Medication Sig Dispense Refill  . acetaminophen (TYLENOL) 500 MG tablet Take 1,000 mg by mouth as needed.        Marland Kitchen ampicillin (PRINCIPEN) 500 MG capsule       . calcium carbonate (OS-CAL) 600 MG TABS Take 600 mg by mouth 3 (three) times daily with meals.        . Cholecalciferol (VITAMIN D) 2000 UNITS tablet Take 2,000 Units by mouth daily.        . colestipol (COLESTID) 1 G tablet Take 5 tablets (5 g total) by mouth daily.  150 tablet  11  . entecavir (BARACLUDE) 1 MG tablet Take 1 tablet (1 mg total) by mouth daily.  30 tablet  6  . fluocinonide ointment (LIDEX) 0.05 % APPLY TO  AFFECTED AREA EVERY DAY AS NEEDED  30 g  3  . FLUoxetine (PROZAC) 20 MG tablet Take 20 mg by mouth as needed.       . hydrocortisone 2.5 % cream APPLY TO AFFECTED AREA FOUR TIMES A DAY FOR RASH AS NEEDED  56 g  3  . ketoconazole (NIZORAL) 2 % cream       . Lancets (ONETOUCH ULTRASOFT) lancets USE AS DIRECTED  100 each  3  . metFORMIN (GLUCOPHAGE) 500 MG tablet Take 1 tablet (500 mg total) by mouth 2 (two) times daily with a meal.  180 tablet  0  . omeprazole (PRILOSEC) 20 MG capsule Take 1 capsule (20 mg total) by mouth daily.  30 capsule  5  . ONE TOUCH ULTRA TEST test strip USE AS DIRECTED  100 each  3  . predniSONE (DELTASONE) 5 MG tablet Take 10 mg by mouth 1 day or 1 dose.       . pyridostigmine (MESTINON) 60 MG tablet Take 60 mg by mouth as needed.       . Sulfacetamide Sodium, Acne, 10 % LOTN       . zolpidem (AMBIEN)  10 MG tablet Take 10 mg by mouth at bedtime as needed.         No current facility-administered medications on file prior to visit.    Allergies  Allergen Reactions  . Azithromycin     REACTION: exac of his mg  . Beta Adrenergic Blockers     REACTION: exac of mg   No family history on file.  BP 122/80  Pulse 76  Ht 5\' 5"  (1.651 m)  Wt 131 lb (59.421 kg)  BMI 21.8 kg/m2  SpO2 97%  Review of Systems He has a slight headache.  Denies fever.    Objective:   Physical Exam VITAL SIGNS:  See vs page GENERAL: no distress head: no deformity eyes: no periorbital swelling, no proptosis external nose and ears are normal mouth: no lesion seen Both tm's are red LUNGS:  Clear to auscultation.    Assessment & Plan:  URI, new. DM: apparently well-controlled. MG: this limits antibiotic options

## 2012-09-23 DIAGNOSIS — H26499 Other secondary cataract, unspecified eye: Secondary | ICD-10-CM | POA: Diagnosis not present

## 2012-09-23 DIAGNOSIS — E109 Type 1 diabetes mellitus without complications: Secondary | ICD-10-CM | POA: Diagnosis not present

## 2012-09-23 DIAGNOSIS — H02409 Unspecified ptosis of unspecified eyelid: Secondary | ICD-10-CM | POA: Diagnosis not present

## 2012-09-27 ENCOUNTER — Ambulatory Visit (INDEPENDENT_AMBULATORY_CARE_PROVIDER_SITE_OTHER): Payer: Medicare Other | Admitting: Endocrinology

## 2012-09-27 ENCOUNTER — Encounter: Payer: Self-pay | Admitting: Endocrinology

## 2012-09-27 VITALS — BP 122/70 | HR 76 | Ht 65.0 in | Wt 128.0 lb

## 2012-09-27 DIAGNOSIS — Z125 Encounter for screening for malignant neoplasm of prostate: Secondary | ICD-10-CM | POA: Diagnosis not present

## 2012-09-27 DIAGNOSIS — E119 Type 2 diabetes mellitus without complications: Secondary | ICD-10-CM

## 2012-09-27 DIAGNOSIS — Z0000 Encounter for general adult medical examination without abnormal findings: Secondary | ICD-10-CM | POA: Diagnosis not present

## 2012-09-27 DIAGNOSIS — E079 Disorder of thyroid, unspecified: Secondary | ICD-10-CM | POA: Diagnosis not present

## 2012-09-27 DIAGNOSIS — E7800 Pure hypercholesterolemia, unspecified: Secondary | ICD-10-CM | POA: Diagnosis not present

## 2012-09-27 DIAGNOSIS — Z79899 Other long term (current) drug therapy: Secondary | ICD-10-CM

## 2012-09-27 DIAGNOSIS — B181 Chronic viral hepatitis B without delta-agent: Secondary | ICD-10-CM

## 2012-09-27 DIAGNOSIS — E559 Vitamin D deficiency, unspecified: Secondary | ICD-10-CM

## 2012-09-27 LAB — BASIC METABOLIC PANEL
Calcium: 10.3 mg/dL (ref 8.4–10.5)
Creatinine, Ser: 0.9 mg/dL (ref 0.4–1.5)
GFR: 91.54 mL/min (ref >60.00)
Sodium: 139 mEq/L (ref 135–145)

## 2012-09-27 LAB — URINALYSIS, ROUTINE W REFLEX MICROSCOPIC
Bilirubin Urine: NEGATIVE
Leukocytes, UA: NEGATIVE
Nitrite: NEGATIVE
Total Protein, Urine: NEGATIVE
WBC, UA: NONE SEEN (ref 0–?)
pH: 6 (ref 5.0–8.0)

## 2012-09-27 LAB — PSA, MEDICARE: PSA: 0.7 ng/ml (ref 0.10–4.00)

## 2012-09-27 LAB — CBC WITH DIFFERENTIAL/PLATELET
Basophils Absolute: 0 10*3/uL (ref 0.0–0.1)
Eosinophils Relative: 0.1 % (ref 0.0–5.0)
HCT: 46.4 % (ref 39.0–52.0)
Lymphs Abs: 1.1 10*3/uL (ref 0.7–4.0)
Monocytes Absolute: 0.2 10*3/uL (ref 0.1–1.0)
Monocytes Relative: 2.4 % — ABNORMAL LOW (ref 3.0–12.0)
Neutrophils Relative %: 83.5 % — ABNORMAL HIGH (ref 43.0–77.0)
Platelets: 195 10*3/uL (ref 150.0–400.0)
RDW: 13.2 % (ref 11.5–14.6)
WBC: 7.8 10*3/uL (ref 4.5–10.5)

## 2012-09-27 LAB — HEPATIC FUNCTION PANEL
ALT: 28 U/L (ref 0–53)
AST: 29 U/L (ref 0–37)
Albumin: 4.4 g/dL (ref 3.5–5.2)
Alkaline Phosphatase: 39 U/L (ref 39–117)

## 2012-09-27 LAB — MICROALBUMIN / CREATININE URINE RATIO
Creatinine,U: 60.7 mg/dL
Microalb Creat Ratio: 1.8 mg/g (ref 0.0–30.0)

## 2012-09-27 LAB — LIPID PANEL
Total CHOL/HDL Ratio: 3
VLDL: 45.8 mg/dL — ABNORMAL HIGH (ref 0.0–40.0)

## 2012-09-27 LAB — HEMOGLOBIN A1C: Hgb A1c MFr Bld: 6 % (ref 4.6–6.5)

## 2012-09-27 LAB — TSH: TSH: 0.34 u[IU]/mL — ABNORMAL LOW (ref 0.35–5.50)

## 2012-09-27 MED ORDER — ZOLPIDEM TARTRATE 10 MG PO TABS: 10.0000 mg | ORAL_TABLET | Freq: Every evening | ORAL | Status: DC | PRN

## 2012-09-27 NOTE — Progress Notes (Signed)
Subjective:    Patient ID: [name], male    DOB: August 03, 1955, 57 y.o.   MRN: [id number]  HPI The state of at least three ongoing medical problems is addressed today, with interval history of each noted here: DM: he denies weight change Insomnia has recurred off the ambien. Pt has h/o intermittently suppressed TSH: he has some generalized fatigue. Past Medical History  Diagnosis Date  . 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 Insomnia, unspecified   . Myasthenia gravis without exacerbation   . Viral hepatitis B without mention of hepatic coma, chronic, without mention of hepatitis delta   . Other and unspecified hyperlipidemia   . Type II or unspecified type diabetes mellitus without mention of complication, not stated as uncontrolled   . Depressive disorder, not elsewhere classified   . Anxiety   . Internal hemorrhoids   . External hemorrhoid   . Colon polyp     Past Surgical History  Procedure Laterality Date  . Thymectomy      History   Social History  . Marital Status: Married    Spouse Name: N/A    Number of Children: N/A  . Years of Education: N/A   Occupational History  . Not on file.   Social History Main Topics  . Smoking status: Never Smoker   . Smokeless tobacco: Not on file  . Alcohol Use: No  . Drug Use: No  . Sexual Activity:    Other Topics Concern  . Not on file   Social History Narrative  . No narrative on file    Current Outpatient Prescriptions on File Prior to Visit  Medication Sig Dispense Refill  . acetaminophen (TYLENOL) 500 MG tablet Take 1,000 mg by mouth as needed.        Marland Kitchen ampicillin (PRINCIPEN) 500 MG capsule       . calcium carbonate (OS-CAL) 600 MG TABS Take 600 mg by mouth 3 (three) times daily with meals.        . Cholecalciferol (VITAMIN D) 2000 UNITS tablet Take 2,000 Units by mouth daily.        . colestipol (COLESTID) 1 G tablet Take 5 tablets (5 g total) by mouth daily.  150 tablet  11  . entecavir  (BARACLUDE) 1 MG tablet Take 1 tablet (1 mg total) by mouth daily.  30 tablet  6  . fluocinonide ointment (LIDEX) 0.05 % APPLY TO AFFECTED AREA EVERY DAY AS NEEDED  30 g  3  . FLUoxetine (PROZAC) 20 MG tablet Take 20 mg by mouth as needed.       . hydrocortisone 2.5 % cream APPLY TO AFFECTED AREA FOUR TIMES A DAY FOR RASH AS NEEDED  56 g  3  . ketoconazole (NIZORAL) 2 % cream       . Lancets (ONETOUCH ULTRASOFT) lancets USE AS DIRECTED  100 each  3  . metFORMIN (GLUCOPHAGE) 500 MG tablet Take 1 tablet (500 mg total) by mouth 2 (two) times daily with a meal.  180 tablet  0  . omeprazole (PRILOSEC) 20 MG capsule Take 1 capsule (20 mg total) by mouth daily.  30 capsule  5  . ONE TOUCH ULTRA TEST test strip USE AS DIRECTED  100 each  3  . predniSONE (DELTASONE) 5 MG tablet Take 10 mg by mouth 1 day or 1 dose.       . pyridostigmine (MESTINON) 60 MG tablet Take 60 mg by mouth as needed.       Marland Kitchen  Sulfacetamide Sodium, Acne, 10 % LOTN        No current facility-administered medications on file prior to visit.    Allergies  Allergen Reactions  . Azithromycin     REACTION: exac of his mg  . Beta Adrenergic Blockers     REACTION: exac of mg    No family history on file.  BP 122/70  Pulse 76  Ht 5\' 5"  (1.651 m)  Wt 128 lb (58.06 kg)  BMI 21.3 kg/m2  SpO2 97%  Review of Systems Denies chest pain and sob    Objective:   Physical Exam VITAL SIGNS:  See vs page GENERAL: no distress  Lab Results  Component Value Date   WBC 7.8 09/27/2012   HGB 16.0 09/27/2012   HCT 46.4 09/27/2012   PLT 195.0 09/27/2012   GLUCOSE 161* 09/27/2012   CHOL 161 09/27/2012   TRIG 229.0* 09/27/2012   HDL 48.20 09/27/2012   LDLDIRECT 78.9 09/27/2012   LDLCALC 68 07/23/2008   ALT 28 09/27/2012   AST 29 09/27/2012   NA 139 09/27/2012   K 4.0 09/27/2012   CL 103 09/27/2012   CREATININE 0.9 09/27/2012   BUN 13 09/27/2012   CO2 29 09/27/2012   TSH 0.34* 09/27/2012   PSA 0.70 09/27/2012   HGBA1C 6.0 09/27/2012    MICROALBUR 1.1 09/27/2012      Assessment & Plan:  Suppressed TSH, mild, recurrent.  No rx needed DM: well-controlled Insomnia.  He needs rx.  Subjective:   Patient here for Medicare annual wellness visit and management of other chronic and acute problems.     Risk factors: several serious med probs   Copy Providing Medical Care to Patient:  See "snapshot"   Activities of Daily Living: In your present state of health, do you have any difficulty performing the following activities?:  Preparing food and eating?: No  Bathing yourself: No  Getting dressed: No  Using the toilet:No  Moving around from place to place: No  In the past year have you fallen or had a near fall?:No    Home Safety: Has smoke detector and wears seat belts. No firearms. No excess sun exposure.  Diet and Exercise  Current exercise habits: pt says good Dietary issues discussed: pt reports a healthy diet   Depression Screen  Q1: Over the past two weeks, have you felt down, depressed or hopeless? no  Q2: Over the past two weeks, have you felt little interest or pleasure in doing things? no   The following portions of the patient's history were reviewed and updated as appropriate: allergies, current medications, past family history, past medical history, past social history, past surgical history and problem list.  Past Medical History  Diagnosis Date  . 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 Insomnia, unspecified   . Myasthenia gravis without exacerbation   . Viral hepatitis B without mention of hepatic coma, chronic, without mention of hepatitis delta   . Other and unspecified hyperlipidemia   . Type II or unspecified type diabetes mellitus without mention of complication, not stated as uncontrolled   . Depressive disorder, not elsewhere classified   . Anxiety   . Internal hemorrhoids   . External hemorrhoid   . Colon polyp     Past Surgical History  Procedure  Laterality Date  . Thymectomy      History   Social History  . Marital Status: Married    Spouse Name: N/A  Number of Children: N/A  . Years of Education: N/A   Occupational History  . Not on file.   Social History Main Topics  . Smoking status: Never Smoker   . Smokeless tobacco: Not on file  . Alcohol Use: No  . Drug Use: No  . Sexual Activity:    Other Topics Concern  . Not on file   Social History Narrative  . No narrative on file    Current Outpatient Prescriptions on File Prior to Visit  Medication Sig Dispense Refill  . acetaminophen (TYLENOL) 500 MG tablet Take 1,000 mg by mouth as needed.        Marland Kitchen ampicillin (PRINCIPEN) 500 MG capsule       . calcium carbonate (OS-CAL) 600 MG TABS Take 600 mg by mouth 3 (three) times daily with meals.        . Cholecalciferol (VITAMIN D) 2000 UNITS tablet Take 2,000 Units by mouth daily.        . colestipol (COLESTID) 1 G tablet Take 5 tablets (5 g total) by mouth daily.  150 tablet  11  . entecavir (BARACLUDE) 1 MG tablet Take 1 tablet (1 mg total) by mouth daily.  30 tablet  6  . fluocinonide ointment (LIDEX) 0.05 % APPLY TO AFFECTED AREA EVERY DAY AS NEEDED  30 g  3  . FLUoxetine (PROZAC) 20 MG tablet Take 20 mg by mouth as needed.       . hydrocortisone 2.5 % cream APPLY TO AFFECTED AREA FOUR TIMES A DAY FOR RASH AS NEEDED  56 g  3  . ketoconazole (NIZORAL) 2 % cream       . Lancets (ONETOUCH ULTRASOFT) lancets USE AS DIRECTED  100 each  3  . metFORMIN (GLUCOPHAGE) 500 MG tablet Take 1 tablet (500 mg total) by mouth 2 (two) times daily with a meal.  180 tablet  0  . omeprazole (PRILOSEC) 20 MG capsule Take 1 capsule (20 mg total) by mouth daily.  30 capsule  5  . ONE TOUCH ULTRA TEST test strip USE AS DIRECTED  100 each  3  . predniSONE (DELTASONE) 5 MG tablet Take 10 mg by mouth 1 day or 1 dose.       . pyridostigmine (MESTINON) 60 MG tablet Take 60 mg by mouth as needed.       . Sulfacetamide Sodium, Acne, 10 % LOTN         No current facility-administered medications on file prior to visit.    Allergies  Allergen Reactions  . Azithromycin     REACTION: exac of his mg  . Beta Adrenergic Blockers     REACTION: exac of mg    No family history on file.  BP 122/70  Pulse 76  Ht 5\' 5"  (1.651 m)  Wt 128 lb (58.06 kg)  BMI 21.3 kg/m2  SpO2 97%   Review of Systems  Denies hearing loss, and visual loss Objective:   Vision:  Sees opthalmologist Hearing: grossly normal Body mass index:  See vs page Msk: pt easily and quickly performs "get-up-and-go" from a sitting position Cognitive Impairment Assessment: cognition, memory and judgment appear normal.  remembers 3/3 at 5 minutes.  excellent recall.  can easily read and write a sentence.  alert and oriented x 3   Assessment:   Medicare wellness utd on preventive parameters    Plan:   During the course of the visit the patient was educated and counseled about appropriate screening and preventive services  including:       Fall prevention   Screening mammography  Bone densitometry screening  Diabetes screening  Nutrition counseling   Vaccines / LABS Zostavax / Pnemonccoal Vaccine  today  PSA  Patient Instructions (the written plan) was given to the patient.   we discussed code status.  pt requests full code, but would not want to be started or maintained on artificial life-support measures if there was not a reasonable chance of recovery

## 2012-09-27 NOTE — Patient Instructions (Addendum)
please consider these measures for your health:  minimize alcohol.  do not use tobacco products.  have a colonoscopy at least every 10 years from age 56.  keep firearms safely stored.  always use seat belts.  have working smoke alarms in your home.  see an eye doctor and dentist regularly.  never drive under the influence of alcohol or drugs (including prescription drugs).  those with fair skin should take precautions against the sun. it is critically important to prevent falling down (keep floor areas well-lit, dry, and free of loose objects.  If you have a cane, walker, or wheelchair, you should use it, even for short trips around the house.  Also, try not to rush). Please come back for a follow-up appointment in 6 months.   blood tests are being requested for you today.  We'll contact you with results.   

## 2012-10-01 ENCOUNTER — Other Ambulatory Visit: Payer: Self-pay | Admitting: Gastroenterology

## 2012-10-03 ENCOUNTER — Other Ambulatory Visit: Payer: Self-pay | Admitting: *Deleted

## 2012-10-03 MED ORDER — METFORMIN HCL 500 MG PO TABS: 500.0000 mg | ORAL_TABLET | Freq: Two times a day (BID) | ORAL | Status: DC

## 2012-10-07 ENCOUNTER — Other Ambulatory Visit: Payer: Self-pay | Admitting: *Deleted

## 2012-10-07 MED ORDER — FLUOCINONIDE 0.05 % EX OINT: TOPICAL_OINTMENT | CUTANEOUS | Status: DC

## 2012-12-31 ENCOUNTER — Other Ambulatory Visit: Payer: Self-pay

## 2012-12-31 MED ORDER — HYDROCORTISONE 2.5 % EX CREA: TOPICAL_CREAM | CUTANEOUS | Status: DC

## 2013-01-01 ENCOUNTER — Ambulatory Visit (INDEPENDENT_AMBULATORY_CARE_PROVIDER_SITE_OTHER): Payer: Medicare Other | Admitting: Endocrinology

## 2013-01-01 VITALS — BP 130/90 | HR 116 | Temp 98.1°F | Ht 65.0 in | Wt 128.0 lb

## 2013-01-01 DIAGNOSIS — J309 Allergic rhinitis, unspecified: Secondary | ICD-10-CM | POA: Diagnosis not present

## 2013-01-01 MED ORDER — BENZONATATE 200 MG PO CAPS: 200.0000 mg | ORAL_CAPSULE | Freq: Two times a day (BID) | ORAL | Status: DC | PRN

## 2013-01-01 MED ORDER — FLUTICASONE PROPIONATE 50 MCG/ACT NA SUSP: 2.0000 | Freq: Every day | NASAL | Status: DC

## 2013-01-01 NOTE — Progress Notes (Signed)
Subjective:    Patient ID: [name], male    DOB: 25-May-1955, 57 y.o.   MRN: [id number]  HPI Pt states 1 week of slight dry-quality cough in the chest, nasal congestion, and assoc itching in the throat.   Past Medical History  Diagnosis Date  . 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 Insomnia, unspecified   . Myasthenia gravis without exacerbation   . Viral hepatitis B without mention of hepatic coma, chronic, without mention of hepatitis delta   . Other and unspecified hyperlipidemia   . Type II or unspecified type diabetes mellitus without mention of complication, not stated as uncontrolled   . Depressive disorder, not elsewhere classified   . Anxiety   . Internal hemorrhoids   . External hemorrhoid   . Colon polyp     Past Surgical History  Procedure Laterality Date  . Thymectomy      History   Social History  . Marital Status: Married    Spouse Name: N/A    Number of Children: N/A  . Years of Education: N/A   Occupational History  . Not on file.   Social History Main Topics  . Smoking status: Never Smoker   . Smokeless tobacco: Not on file  . Alcohol Use: No  . Drug Use: No  . Sexual Activity:    Other Topics Concern  . Not on file   Social History Narrative  . No narrative on file    Current Outpatient Prescriptions on File Prior to Visit  Medication Sig Dispense Refill  . acetaminophen (TYLENOL) 500 MG tablet Take 1,000 mg by mouth as needed.        Marland Kitchen ampicillin (PRINCIPEN) 500 MG capsule       . calcium carbonate (OS-CAL) 600 MG TABS Take 600 mg by mouth 3 (three) times daily with meals.        . Cholecalciferol (VITAMIN D) 2000 UNITS tablet Take 2,000 Units by mouth daily.        . colestipol (COLESTID) 1 G tablet Take 5 tablets (5 g total) by mouth daily.  150 tablet  11  . entecavir (BARACLUDE) 1 MG tablet Take 1 tablet (1 mg total) by mouth daily.  30 tablet  6  . fluocinonide ointment (LIDEX) 0.05 % APPLY TO AFFECTED AREA  EVERY DAY AS NEEDED.  30 g  3  . FLUoxetine (PROZAC) 20 MG tablet Take 20 mg by mouth as needed.       . hydrocortisone 2.5 % cream APPLY TO AFFECTED AREA FOUR TIMES A DAY FOR RASH AS NEEDED  56 g  3  . ketoconazole (NIZORAL) 2 % cream       . Lancets (ONETOUCH ULTRASOFT) lancets USE AS DIRECTED  100 each  3  . metFORMIN (GLUCOPHAGE) 500 MG tablet Take 1 tablet (500 mg total) by mouth 2 (two) times daily with a meal.  180 tablet  1  . omeprazole (PRILOSEC) 20 MG capsule Take 1 capsule (20 mg total) by mouth daily.  30 capsule  5  . ONE TOUCH ULTRA TEST test strip USE AS DIRECTED  100 each  3  . predniSONE (DELTASONE) 5 MG tablet Take 10 mg by mouth 1 day or 1 dose.       . pyridostigmine (MESTINON) 60 MG tablet Take 60 mg by mouth as needed.       . Sulfacetamide Sodium, Acne, 10 % LOTN       . zolpidem (AMBIEN) 10  MG tablet Take 1 tablet (10 mg total) by mouth at bedtime as needed.  30 tablet  5   No current facility-administered medications on file prior to visit.    Allergies  Allergen Reactions  . Azithromycin     REACTION: exac of his mg  . Beta Adrenergic Blockers     REACTION: exac of mg    No family history on file.  BP 130/90  Pulse 116  Temp(Src) 98.1 F (36.7 C) (Oral)  Ht 5\' 5"  (1.651 m)  Wt 128 lb (58.06 kg)  BMI 21.30 kg/m2  SpO2 98%  Review of Systems He has slight headache, but no fever.    Objective:   Physical Exam VITAL SIGNS:  See vs page GENERAL: no distress head: no deformity eyes: no periorbital swelling, no proptosis external nose and ears are normal mouth: no lesion seen Both tm's are slightly red LUNGS:  Clear to auscultation      Assessment & Plan:  Allergic rhinitis, recurrent

## 2013-01-01 NOTE — Patient Instructions (Addendum)
i have sent 2 prescriptions to your pharmacy, for a nasal spray, and cough pills.  I hope you feel better soon.  If you don't feel better by next week, please call back.  Please call sooner if you get worse.

## 2013-01-02 DIAGNOSIS — J309 Allergic rhinitis, unspecified: Secondary | ICD-10-CM | POA: Insufficient documentation

## 2013-01-03 ENCOUNTER — Other Ambulatory Visit: Payer: Self-pay | Admitting: Endocrinology

## 2013-01-03 MED ORDER — ZOLPIDEM TARTRATE 10 MG PO TABS: 10.0000 mg | ORAL_TABLET | Freq: Every evening | ORAL | Status: DC | PRN

## 2013-01-03 MED ORDER — METFORMIN HCL ER 500 MG PO TB24: ORAL_TABLET | ORAL | Status: DC

## 2013-02-05 DIAGNOSIS — IMO0002 Reserved for concepts with insufficient information to code with codable children: Secondary | ICD-10-CM | POA: Diagnosis not present

## 2013-02-05 DIAGNOSIS — B1910 Unspecified viral hepatitis B without hepatic coma: Secondary | ICD-10-CM | POA: Diagnosis not present

## 2013-02-05 DIAGNOSIS — G7000 Myasthenia gravis without (acute) exacerbation: Secondary | ICD-10-CM | POA: Diagnosis not present

## 2013-03-20 DIAGNOSIS — B181 Chronic viral hepatitis B without delta-agent: Secondary | ICD-10-CM | POA: Diagnosis not present

## 2013-04-02 ENCOUNTER — Other Ambulatory Visit: Payer: Self-pay | Admitting: Endocrinology

## 2013-05-06 ENCOUNTER — Other Ambulatory Visit: Payer: Self-pay

## 2013-05-06 MED ORDER — GLUCOSE BLOOD VI STRP: ORAL_STRIP | Status: DC

## 2013-07-22 ENCOUNTER — Encounter: Payer: Self-pay | Admitting: Gastroenterology

## 2013-07-31 ENCOUNTER — Other Ambulatory Visit: Payer: Self-pay | Admitting: Endocrinology

## 2013-08-19 ENCOUNTER — Telehealth: Payer: Self-pay

## 2013-08-19 NOTE — Telephone Encounter (Signed)
LVM for pt to call and sched CPE and a1c recheck with PCP

## 2013-08-29 ENCOUNTER — Other Ambulatory Visit: Payer: Self-pay | Admitting: *Deleted

## 2013-08-29 MED ORDER — METFORMIN HCL ER 500 MG PO TB24: ORAL_TABLET | ORAL | Status: DC

## 2013-08-29 MED ORDER — FLUTICASONE PROPIONATE 50 MCG/ACT NA SUSP: 2.0000 | Freq: Every day | NASAL | Status: DC

## 2013-08-29 MED ORDER — COLESTIPOL HCL 1 G PO TABS: 5.0000 g | ORAL_TABLET | Freq: Every day | ORAL | Status: DC

## 2013-09-09 DIAGNOSIS — H26499 Other secondary cataract, unspecified eye: Secondary | ICD-10-CM | POA: Diagnosis not present

## 2013-09-09 DIAGNOSIS — E119 Type 2 diabetes mellitus without complications: Secondary | ICD-10-CM | POA: Diagnosis not present

## 2013-09-09 DIAGNOSIS — H43399 Other vitreous opacities, unspecified eye: Secondary | ICD-10-CM | POA: Diagnosis not present

## 2013-09-23 ENCOUNTER — Ambulatory Visit (INDEPENDENT_AMBULATORY_CARE_PROVIDER_SITE_OTHER): Payer: Medicare Other | Admitting: Endocrinology

## 2013-09-23 ENCOUNTER — Encounter: Payer: Self-pay | Admitting: Endocrinology

## 2013-09-23 VITALS — BP 126/84 | HR 101 | Temp 98.5°F | Ht 65.0 in | Wt 131.0 lb

## 2013-09-23 DIAGNOSIS — E119 Type 2 diabetes mellitus without complications: Secondary | ICD-10-CM

## 2013-09-23 DIAGNOSIS — M810 Age-related osteoporosis without current pathological fracture: Secondary | ICD-10-CM | POA: Diagnosis not present

## 2013-09-23 DIAGNOSIS — E079 Disorder of thyroid, unspecified: Secondary | ICD-10-CM | POA: Diagnosis not present

## 2013-09-23 DIAGNOSIS — E7800 Pure hypercholesterolemia, unspecified: Secondary | ICD-10-CM | POA: Diagnosis not present

## 2013-09-23 DIAGNOSIS — Z79899 Other long term (current) drug therapy: Secondary | ICD-10-CM | POA: Diagnosis not present

## 2013-09-23 DIAGNOSIS — E559 Vitamin D deficiency, unspecified: Secondary | ICD-10-CM | POA: Diagnosis not present

## 2013-09-23 DIAGNOSIS — D609 Acquired pure red cell aplasia, unspecified: Secondary | ICD-10-CM | POA: Diagnosis not present

## 2013-09-23 LAB — LDL CHOLESTEROL, DIRECT: Direct LDL: 77.4 mg/dL

## 2013-09-23 LAB — LIPID PANEL
CHOL/HDL RATIO: 4
CHOLESTEROL: 146 mg/dL (ref 0–200)
HDL: 36.9 mg/dL — AB (ref >39.00)
NonHDL: 109.1
TRIGLYCERIDES: 362 mg/dL — AB (ref 0.0–149.0)
VLDL: 72.4 mg/dL — AB (ref 0.0–40.0)

## 2013-09-23 LAB — CBC WITH DIFFERENTIAL/PLATELET
BASOS PCT: 0.6 % (ref 0.0–3.0)
Basophils Absolute: 0 10*3/uL (ref 0.0–0.1)
EOS ABS: 0.2 10*3/uL (ref 0.0–0.7)
Eosinophils Relative: 4.1 % (ref 0.0–5.0)
HCT: 44.7 % (ref 39.0–52.0)
HEMOGLOBIN: 15.4 g/dL (ref 13.0–17.0)
LYMPHS PCT: 25.8 % (ref 12.0–46.0)
Lymphs Abs: 1.3 10*3/uL (ref 0.7–4.0)
MCHC: 34.3 g/dL (ref 30.0–36.0)
MCV: 88.7 fl (ref 78.0–100.0)
MONO ABS: 0.4 10*3/uL (ref 0.1–1.0)
Monocytes Relative: 7.4 % (ref 3.0–12.0)
NEUTROS ABS: 3.2 10*3/uL (ref 1.4–7.7)
Neutrophils Relative %: 62.1 % (ref 43.0–77.0)
Platelets: 174 10*3/uL (ref 150.0–400.0)
RBC: 5.04 Mil/uL (ref 4.22–5.81)
RDW: 12.9 % (ref 11.5–15.5)
WBC: 5.2 10*3/uL (ref 4.0–10.5)

## 2013-09-23 LAB — URINALYSIS, ROUTINE W REFLEX MICROSCOPIC
BILIRUBIN URINE: NEGATIVE
KETONES UR: NEGATIVE
LEUKOCYTES UA: NEGATIVE
Nitrite: NEGATIVE
RBC / HPF: NONE SEEN (ref 0–?)
Specific Gravity, Urine: <=1.005 — AB (ref 1.000–1.030)
Total Protein, Urine: NEGATIVE
UROBILINOGEN UA: 0.2 (ref 0.0–1.0)
WBC, UA: NONE SEEN (ref 0–?)
pH: 5.5 (ref 5.0–8.0)

## 2013-09-23 LAB — HEPATIC FUNCTION PANEL
ALBUMIN: 4.1 g/dL (ref 3.5–5.2)
ALK PHOS: 41 U/L (ref 39–117)
ALT: 31 U/L (ref 0–53)
AST: 34 U/L (ref 0–37)
BILIRUBIN TOTAL: 0.9 mg/dL (ref 0.2–1.2)
Bilirubin, Direct: 0.1 mg/dL (ref 0.0–0.3)
Total Protein: 7 g/dL (ref 6.0–8.3)

## 2013-09-23 LAB — BASIC METABOLIC PANEL
BUN: 11 mg/dL (ref 6–23)
CHLORIDE: 102 meq/L (ref 96–112)
CO2: 26 meq/L (ref 19–32)
CREATININE: 0.9 mg/dL (ref 0.4–1.5)
Calcium: 9.3 mg/dL (ref 8.4–10.5)
GFR: 88.96 mL/min (ref >60.00)
GLUCOSE: 200 mg/dL — AB (ref 70–99)
Potassium: 3.4 mEq/L — ABNORMAL LOW (ref 3.5–5.1)
Sodium: 138 mEq/L (ref 135–145)

## 2013-09-23 LAB — IBC PANEL
Iron: 137 ug/dL (ref 42–165)
SATURATION RATIOS: 45.4 % (ref 20.0–50.0)
Transferrin: 215.7 mg/dL (ref 212.0–360.0)

## 2013-09-23 LAB — MICROALBUMIN / CREATININE URINE RATIO
Creatinine,U: 38 mg/dL
Microalb Creat Ratio: 0.5 mg/g (ref 0.0–30.0)
Microalb, Ur: 0.2 mg/dL (ref 0.0–1.9)

## 2013-09-23 LAB — VITAMIN D 25 HYDROXY (VIT D DEFICIENCY, FRACTURES): VITD: 57.02 ng/mL (ref 30.00–100.00)

## 2013-09-23 LAB — TSH: TSH: 0.32 u[IU]/mL — AB (ref 0.35–4.50)

## 2013-09-23 LAB — HEMOGLOBIN A1C: Hgb A1c MFr Bld: 5.9 % (ref 4.6–6.5)

## 2013-09-23 NOTE — Patient Instructions (Addendum)
Please come back soon for a "medicare wellness" appointment (after 09/27/13). blood tests are being requested for you today.  We'll contact you with results. Please let me know if you want to see a neurology specialist.

## 2013-09-23 NOTE — Progress Notes (Signed)
Subjective:    Patient ID: [name], male    DOB: 10-02-1955, 58 y.o.   MRN: [id number]  HPI Pt states few mos of slight weakness of both LE's.  Pt says he has seen Dr Burnett Harry for this, and was it is not due to the MG.   no cbg record, but states cbg's vary from 90-200's.   Past Medical History  Diagnosis Date  . 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 Insomnia, unspecified   . Myasthenia gravis without exacerbation   . Viral hepatitis B without mention of hepatic coma, chronic, without mention of hepatitis delta   . Other and unspecified hyperlipidemia   . Type II or unspecified type diabetes mellitus without mention of complication, not stated as uncontrolled   . Depressive disorder, not elsewhere classified   . Anxiety   . Internal hemorrhoids   . External hemorrhoid   . Colon polyp     Past Surgical History  Procedure Laterality Date  . Thymectomy      History   Social History  . Marital Status: Married    Spouse Name: N/A    Number of Children: N/A  . Years of Education: N/A   Occupational History  . Not on file.   Social History Main Topics  . Smoking status: Never Smoker   . Smokeless tobacco: Not on file  . Alcohol Use: No  . Drug Use: No  . Sexual Activity:    Other Topics Concern  . Not on file   Social History Narrative  . No narrative on file    Current Outpatient Prescriptions on File Prior to Visit  Medication Sig Dispense Refill  . acetaminophen (TYLENOL) 500 MG tablet Take 1,000 mg by mouth as needed.        Marland Kitchen ampicillin (PRINCIPEN) 500 MG capsule       . benzonatate (TESSALON) 200 MG capsule Take 1 capsule (200 mg total) by mouth 2 (two) times daily as needed for cough.  20 capsule  0  . calcium carbonate (OS-CAL) 600 MG TABS Take 600 mg by mouth 3 (three) times daily with meals.        . Cholecalciferol (VITAMIN D) 2000 UNITS tablet Take 2,000 Units by mouth daily.        . colestipol (COLESTID) 1 G tablet Take 5  tablets (5 g total) by mouth daily.  450 tablet  0  . entecavir (BARACLUDE) 1 MG tablet Take 1 tablet (1 mg total) by mouth daily.  30 tablet  6  . fluocinonide ointment (LIDEX) 0.05 % APPLY TO AFFECTED AREA EVERY DAY AS NEEDED.  30 g  3  . FLUoxetine (PROZAC) 20 MG tablet Take 20 mg by mouth as needed.       . fluticasone (FLONASE) 50 MCG/ACT nasal spray Place 2 sprays into both nostrils daily.  48 g  0  . glucose blood (ONE TOUCH ULTRA TEST) test strip Use to check blood sugar 1xday.  100 each  3  . hydrocortisone 2.5 % cream APPLY TO AFFECTED AREA FOUR TIMES A DAY FOR RASH AS NEEDED  56 g  3  . ketoconazole (NIZORAL) 2 % cream       . Lancets (ONETOUCH ULTRASOFT) lancets USE AS DIRECTED  100 each  3  . metFORMIN (GLUCOPHAGE XR) 500 MG 24 hr tablet 2 tabs, twice a day  360 tablet  0  . omeprazole (PRILOSEC) 20 MG capsule Take 1 capsule (20 mg total)  by mouth daily.  30 capsule  5  . omeprazole (PRILOSEC) 20 MG capsule TAKE 1 CAPSULE BY MOUTH ONCE DAILY  30 capsule  5  . predniSONE (DELTASONE) 5 MG tablet Take 10 mg by mouth 1 day or 1 dose. 1 tab every other day      . pyridostigmine (MESTINON) 60 MG tablet Take 60 mg by mouth as needed.       . Sulfacetamide Sodium, Acne, 10 % LOTN       . zolpidem (AMBIEN) 10 MG tablet TAKE 1 TABLET AT BEDTIME AS NEEDED  30 tablet  5   No current facility-administered medications on file prior to visit.    Allergies  Allergen Reactions  . Azithromycin     REACTION: exac of his mg  . Beta Adrenergic Blockers     REACTION: exac of mg    No family history on file.  BP 126/84  Pulse 101  Temp(Src) 98.5 F (36.9 C) (Oral)  Ht 5\' 5"  (1.651 m)  Wt 131 lb (59.421 kg)  BMI 21.80 kg/m2  SpO2 98%    Review of Systems  Constitutional: Negative for fever.  HENT: Negative for hearing loss.   Eyes: Negative for visual disturbance.  Respiratory: Negative for shortness of breath.   Cardiovascular: Negative for chest pain.  Gastrointestinal: Negative  for vomiting.  Endocrine: Negative for cold intolerance.  Genitourinary: Negative for frequency.  Musculoskeletal: Negative for back pain.  Skin: Negative for wound.  Neurological: Negative for syncope.  Hematological: Does not bruise/bleed easily.  Psychiatric/Behavioral: Positive for dysphoric mood.   Denies numbness and LE pain.  He has intermittent headache and insomnia.      Objective:   Physical Exam VITAL SIGNS:  See vs page GENERAL: no distress Pulses: dorsalis pedis intact bilat.   Feet: no deformity. normal color and temp.  no edema Skin:  no ulcer on the feet.   Neuro: sensation is intact to touch on the feet.  Motor strength of the LE's seems normal to me.   Gait: normal and steady.     i have reviewed the following outside records: Dr Carolyne Fiscal notes  i reviewed electrocardiogram.  Lab Results  Component Value Date   WBC 5.2 09/23/2013   HGB 15.4 09/23/2013   HCT 44.7 09/23/2013   PLT 174.0 09/23/2013   GLUCOSE 200* 09/23/2013   CHOL 146 09/23/2013   TRIG 362.0* 09/23/2013   HDL 36.90* 09/23/2013   LDLDIRECT 77.4 09/23/2013   LDLCALC 68 07/23/2008   ALT 31 09/23/2013   AST 34 09/23/2013   NA 138 09/23/2013   K 3.4* 09/23/2013   CL 102 09/23/2013   CREATININE 0.9 09/23/2013   BUN 11 09/23/2013   CO2 26 09/23/2013   TSH 0.32* 09/23/2013   PSA 0.70 09/27/2012   HGBA1C 5.9 09/23/2013   MICROALBUR 0.2 09/23/2013      Assessment & Plan:  DM: well-controlled. Headache: pt is offered ref to neurol, but he declines. Weakness, new to me: pt declines further evaluation. Hyperthyroidism: very mild, uncertain etiology.  No rx is needed.    Patient is advised the following: Patient Instructions  Please come back soon for a "medicare wellness" appointment (after 09/27/13). blood tests are being requested for you today.  We'll contact you with results. Please let me know if you want to see a neurology specialist.

## 2013-09-25 LAB — PTH, INTACT AND CALCIUM
Calcium: 9.2 mg/dL (ref 8.4–10.5)
PTH: 20 pg/mL (ref 14–64)

## 2013-09-29 ENCOUNTER — Encounter: Payer: Self-pay | Admitting: Endocrinology

## 2013-09-29 ENCOUNTER — Ambulatory Visit (INDEPENDENT_AMBULATORY_CARE_PROVIDER_SITE_OTHER): Payer: Medicare Other | Admitting: Endocrinology

## 2013-09-29 VITALS — BP 126/82 | HR 98 | Temp 98.1°F | Wt 130.0 lb

## 2013-09-29 DIAGNOSIS — M810 Age-related osteoporosis without current pathological fracture: Secondary | ICD-10-CM

## 2013-09-29 DIAGNOSIS — Z0000 Encounter for general adult medical examination without abnormal findings: Secondary | ICD-10-CM

## 2013-09-29 DIAGNOSIS — Z23 Encounter for immunization: Secondary | ICD-10-CM | POA: Diagnosis not present

## 2013-09-29 MED ORDER — FLUOXETINE HCL 20 MG PO TABS: 20.0000 mg | ORAL_TABLET | ORAL | Status: DC | PRN

## 2013-09-29 NOTE — Progress Notes (Signed)
Subjective:    Patient ID: [name], male    DOB: 17-Nov-1955, 58 y.o.   MRN: [id number]  HPI ("medicare wellness" only)   Review of Systems     Objective:   Physical Exam        Assessment & Plan:     Subjective:   Patient here for Medicare annual wellness visit and management of other chronic and acute problems.     Risk factors: health problems  Roster of Physicians Providing Medical Care to Patient:  See "snapshot"   Activities of Daily Living: In your present state of health, do you have any difficulty performing the following activities?:  Preparing food and eating?: No  Bathing yourself: No  Getting dressed: No  Using the toilet:No  Moving around from place to place: No  In the past year have you fallen or had a near fall?:No    Home Safety: Has smoke detector and wears seat belts. No firearms. No excess sun exposure.  Diet and Exercise  Current exercise habits: limited by health problems. Dietary issues discussed: pt reports a healthy diet.    Depression Screen  Q1: Over the past two weeks, have you felt down, depressed or hopeless? Intermittent depression Q2: Over the past two weeks, have you felt little interest or pleasure in doing things? no   The following portions of the patient's history were reviewed and updated as appropriate: allergies, current medications, past family history, past medical history, past social history, past surgical history and problem list.  Past Medical History  Diagnosis Date  . 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 Insomnia, unspecified   . Myasthenia gravis without exacerbation   . Viral hepatitis B without mention of hepatic coma, chronic, without mention of hepatitis delta   . Other and unspecified hyperlipidemia   . Type II or unspecified type diabetes mellitus without mention of complication, not stated as uncontrolled   . Depressive disorder, not elsewhere classified   . Anxiety   .  Internal hemorrhoids   . External hemorrhoid   . Colon polyp     Past Surgical History  Procedure Laterality Date  . Thymectomy      History   Social History  . Marital Status: Married    Spouse Name: N/A    Number of Children: N/A  . Years of Education: N/A   Occupational History  . Not on file.   Social History Main Topics  . Smoking status: Never Smoker   . Smokeless tobacco: Not on file  . Alcohol Use: No  . Drug Use: No  . Sexual Activity:    Other Topics Concern  . Not on file   Social History Narrative  . No narrative on file    Current Outpatient Prescriptions on File Prior to Visit  Medication Sig Dispense Refill  . acetaminophen (TYLENOL) 500 MG tablet Take 1,000 mg by mouth as needed.        Marland Kitchen ampicillin (PRINCIPEN) 500 MG capsule       . benzonatate (TESSALON) 200 MG capsule Take 1 capsule (200 mg total) by mouth 2 (two) times daily as needed for cough.  20 capsule  0  . calcium carbonate (OS-CAL) 600 MG TABS Take 600 mg by mouth 3 (three) times daily with meals.        . Cholecalciferol (VITAMIN D) 2000 UNITS tablet Take 2,000 Units by mouth daily.        . colestipol (COLESTID) 1 G tablet  Take 5 tablets (5 g total) by mouth daily.  450 tablet  0  . entecavir (BARACLUDE) 1 MG tablet Take 1 tablet (1 mg total) by mouth daily.  30 tablet  6  . fluocinonide ointment (LIDEX) 0.05 % APPLY TO AFFECTED AREA EVERY DAY AS NEEDED.  30 g  3  . FLUoxetine (PROZAC) 20 MG tablet Take 20 mg by mouth as needed.       . fluticasone (FLONASE) 50 MCG/ACT nasal spray Place 2 sprays into both nostrils daily.  48 g  0  . glucose blood (ONE TOUCH ULTRA TEST) test strip Use to check blood sugar 1xday.  100 each  3  . hydrocortisone 2.5 % cream APPLY TO AFFECTED AREA FOUR TIMES A DAY FOR RASH AS NEEDED  56 g  3  . ketoconazole (NIZORAL) 2 % cream       . Lancets (ONETOUCH ULTRASOFT) lancets USE AS DIRECTED  100 each  3  . metFORMIN (GLUCOPHAGE XR) 500 MG 24 hr tablet 2 tabs,  twice a day  360 tablet  0  . omeprazole (PRILOSEC) 20 MG capsule Take 1 capsule (20 mg total) by mouth daily.  30 capsule  5  . omeprazole (PRILOSEC) 20 MG capsule TAKE 1 CAPSULE BY MOUTH ONCE DAILY  30 capsule  5  . predniSONE (DELTASONE) 5 MG tablet Take 10 mg by mouth 1 day or 1 dose. 1 tab every other day      . pyridostigmine (MESTINON) 60 MG tablet Take 60 mg by mouth as needed.       . Sulfacetamide Sodium, Acne, 10 % LOTN       . zolpidem (AMBIEN) 10 MG tablet TAKE 1 TABLET AT BEDTIME AS NEEDED  30 tablet  5   No current facility-administered medications on file prior to visit.    Allergies  Allergen Reactions  . Azithromycin     REACTION: exac of his mg  . Beta Adrenergic Blockers     REACTION: exac of mg    No family history on file.  BP 126/82  Pulse 98  Temp(Src) 98.1 F (36.7 C) (Oral)  Wt 130 lb (58.968 kg)  SpO2 97%  Past Medical History  Diagnosis Date  . 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 Insomnia, unspecified   . Myasthenia gravis without exacerbation   . Viral hepatitis B without mention of hepatic coma, chronic, without mention of hepatitis delta   . Other and unspecified hyperlipidemia   . Type II or unspecified type diabetes mellitus without mention of complication, not stated as uncontrolled   . Depressive disorder, not elsewhere classified   . Anxiety   . Internal hemorrhoids   . External hemorrhoid   . Colon polyp     Past Surgical History  Procedure Laterality Date  . Thymectomy      History   Social History  . Marital Status: Married    Spouse Name: N/A    Number of Children: N/A  . Years of Education: N/A   Occupational History  . Not on file.   Social History Main Topics  . Smoking status: Never Smoker   . Smokeless tobacco: Not on file  . Alcohol Use: No  . Drug Use: No  . Sexual Activity:    Other Topics Concern  . Not on file   Social History Narrative  . No narrative on file    Current  Outpatient Prescriptions on File Prior to Visit  Medication Sig  Dispense Refill  . acetaminophen (TYLENOL) 500 MG tablet Take 1,000 mg by mouth as needed.        Marland Kitchen ampicillin (PRINCIPEN) 500 MG capsule       . benzonatate (TESSALON) 200 MG capsule Take 1 capsule (200 mg total) by mouth 2 (two) times daily as needed for cough.  20 capsule  0  . calcium carbonate (OS-CAL) 600 MG TABS Take 600 mg by mouth 3 (three) times daily with meals.        . Cholecalciferol (VITAMIN D) 2000 UNITS tablet Take 2,000 Units by mouth daily.        . colestipol (COLESTID) 1 G tablet Take 5 tablets (5 g total) by mouth daily.  450 tablet  0  . entecavir (BARACLUDE) 1 MG tablet Take 1 tablet (1 mg total) by mouth daily.  30 tablet  6  . fluocinonide ointment (LIDEX) 0.05 % APPLY TO AFFECTED AREA EVERY DAY AS NEEDED.  30 g  3  . fluticasone (FLONASE) 50 MCG/ACT nasal spray Place 2 sprays into both nostrils daily.  48 g  0  . glucose blood (ONE TOUCH ULTRA TEST) test strip Use to check blood sugar 1xday.  100 each  3  . hydrocortisone 2.5 % cream APPLY TO AFFECTED AREA FOUR TIMES A DAY FOR RASH AS NEEDED  56 g  3  . ketoconazole (NIZORAL) 2 % cream       . Lancets (ONETOUCH ULTRASOFT) lancets USE AS DIRECTED  100 each  3  . metFORMIN (GLUCOPHAGE XR) 500 MG 24 hr tablet 2 tabs, twice a day  360 tablet  0  . omeprazole (PRILOSEC) 20 MG capsule Take 1 capsule (20 mg total) by mouth daily.  30 capsule  5  . omeprazole (PRILOSEC) 20 MG capsule TAKE 1 CAPSULE BY MOUTH ONCE DAILY  30 capsule  5  . predniSONE (DELTASONE) 5 MG tablet Take 10 mg by mouth 1 day or 1 dose. 1 tab every other day      . pyridostigmine (MESTINON) 60 MG tablet Take 60 mg by mouth as needed.       . Sulfacetamide Sodium, Acne, 10 % LOTN       . zolpidem (AMBIEN) 10 MG tablet TAKE 1 TABLET AT BEDTIME AS NEEDED  30 tablet  5   No current facility-administered medications on file prior to visit.    Allergies  Allergen Reactions  . Azithromycin      REACTION: exac of his mg  . Beta Adrenergic Blockers     REACTION: exac of mg    No family history on file.  BP 126/82  Pulse 98  Temp(Src) 98.1 F (36.7 C) (Oral)  Wt 130 lb (58.968 kg)  SpO2 97%   Review of Systems  Denies hearing loss, and visual loss Objective:   Vision:  Sees opthalmologist Hearing: grossly normal Body mass index:  See vs page Msk: pt easily and quickly performs "get-up-and-go" from a sitting position Cognitive Impairment Assessment: cognition, memory and judgment appear normal.  remembers 3/3 at 5 minutes.  excellent recall.  can easily read and write a sentence.  alert and oriented x 3   Assessment:   Medicare wellness utd on preventive parameters    Plan:   During the course of the visit the patient was educated and counseled about appropriate screening and preventive services including:        Fall prevention    Diabetes screening  Nutrition counseling   Necessary vaccines are  given today Patient Instructions (the written plan) was given to the patient.   we discussed code status.  pt requests full code, but would not want to be started or maintained on artificial life-support measures if there was not a reasonable chance of recovery.

## 2013-09-29 NOTE — Patient Instructions (Addendum)
please consider these measures for your health:  minimize alcohol.  do not use tobacco products.  have a colonoscopy at least every 10 years from age 58.  keep firearms safely stored.  always use seat belts.  have working smoke alarms in your home.  see an eye doctor and dentist regularly.  never drive under the influence of alcohol or drugs (including prescription drugs).   good diet and exercise habits significanly improve the control of your diabetes.  please let me know if you wish to be referred to a dietician.  high blood sugar is very risky to your health.  you should see an eye doctor and dentist every year.   Please come back for a follow-up appointment in 6 months.

## 2013-09-30 ENCOUNTER — Other Ambulatory Visit: Payer: Self-pay | Admitting: Endocrinology

## 2013-09-30 DIAGNOSIS — L259 Unspecified contact dermatitis, unspecified cause: Secondary | ICD-10-CM | POA: Diagnosis not present

## 2013-09-30 DIAGNOSIS — L719 Rosacea, unspecified: Secondary | ICD-10-CM | POA: Diagnosis not present

## 2013-09-30 DIAGNOSIS — L708 Other acne: Secondary | ICD-10-CM | POA: Diagnosis not present

## 2013-09-30 DIAGNOSIS — L219 Seborrheic dermatitis, unspecified: Secondary | ICD-10-CM | POA: Diagnosis not present

## 2013-10-09 ENCOUNTER — Ambulatory Visit (INDEPENDENT_AMBULATORY_CARE_PROVIDER_SITE_OTHER)
Admission: RE | Admit: 2013-10-09 | Discharge: 2013-10-09 | Disposition: A | Discharge status: Home / Self Care | Payer: Medicare Other | Source: Ambulatory Visit | Attending: Endocrinology | Admitting: Endocrinology

## 2013-10-09 DIAGNOSIS — M810 Age-related osteoporosis without current pathological fracture: Secondary | ICD-10-CM

## 2013-10-14 ENCOUNTER — Telehealth: Payer: Self-pay | Admitting: Endocrinology

## 2013-10-14 NOTE — Telephone Encounter (Signed)
Patient is returning your call.  

## 2013-10-14 NOTE — Telephone Encounter (Signed)
Called and advised pt of recent bone density results.

## 2013-12-03 DIAGNOSIS — G7000 Myasthenia gravis without (acute) exacerbation: Secondary | ICD-10-CM | POA: Diagnosis not present

## 2014-01-02 ENCOUNTER — Other Ambulatory Visit: Payer: Self-pay | Admitting: Endocrinology

## 2014-01-31 ENCOUNTER — Other Ambulatory Visit: Payer: Self-pay | Admitting: Endocrinology

## 2014-03-30 ENCOUNTER — Encounter: Payer: Self-pay | Admitting: Endocrinology

## 2014-03-30 ENCOUNTER — Ambulatory Visit (INDEPENDENT_AMBULATORY_CARE_PROVIDER_SITE_OTHER): Payer: Medicare Other | Admitting: Endocrinology

## 2014-03-30 VITALS — BP 122/84 | HR 99 | Temp 98.3°F | Ht 65.0 in | Wt 130.0 lb

## 2014-03-30 DIAGNOSIS — E559 Vitamin D deficiency, unspecified: Secondary | ICD-10-CM | POA: Diagnosis not present

## 2014-03-30 DIAGNOSIS — E119 Type 2 diabetes mellitus without complications: Secondary | ICD-10-CM

## 2014-03-30 DIAGNOSIS — E079 Disorder of thyroid, unspecified: Secondary | ICD-10-CM

## 2014-03-30 DIAGNOSIS — E876 Hypokalemia: Secondary | ICD-10-CM

## 2014-03-30 DIAGNOSIS — R51 Headache: Secondary | ICD-10-CM

## 2014-03-30 DIAGNOSIS — R519 Headache, unspecified: Secondary | ICD-10-CM

## 2014-03-30 LAB — BASIC METABOLIC PANEL
BUN: 16 mg/dL (ref 6–23)
CALCIUM: 10.6 mg/dL — AB (ref 8.4–10.5)
CO2: 28 meq/L (ref 19–32)
CREATININE: 0.98 mg/dL (ref 0.40–1.50)
Chloride: 104 mEq/L (ref 96–112)
GFR: 83.59 mL/min (ref >60.00)
GLUCOSE: 111 mg/dL — AB (ref 70–99)
Potassium: 3.9 mEq/L (ref 3.5–5.1)
Sodium: 143 mEq/L (ref 135–145)

## 2014-03-30 LAB — HEMOGLOBIN A1C: HEMOGLOBIN A1C: 6 % (ref 4.6–6.5)

## 2014-03-30 LAB — VITAMIN D 25 HYDROXY (VIT D DEFICIENCY, FRACTURES): VITD: 56.07 ng/mL (ref 30.00–100.00)

## 2014-03-30 LAB — TSH: TSH: 0.68 u[IU]/mL (ref 0.35–4.50)

## 2014-03-30 NOTE — Patient Instructions (Addendum)
blood tests are being requested for you today.  We'll let you know about the results. Please call if you decide to see a specialist for the headache.  Please come back for a "medicare wellness" appointment in 7 months (must be after 09/30/14).  You are also at risk for Addison's dx, thyroid problems, vitiligo, low testosterone, celiac dz, and low vitamin B-12.

## 2014-03-30 NOTE — Progress Notes (Signed)
Subjective:    Patient ID: [name], male    DOB: July 07, 1955, 59 y.o.   MRN: [id number]  HPI Pt returns for f/u of diabetes mellitus: DM type: 2, but he is presumed to be evolving type 1, given lean body habitus, MG, and neg FHx.   Dx'ed: 9509 Complications: none Therapy: metformin DKA: never Severe hypoglycemia: never Pancreatitis: never Other:  Interval history: he denies weight change Insomnia persists Pt has h/o intermittently suppressed TSH: fatigue persists.  Chronic headache has recurred.   Past Medical History  Diagnosis Date  . 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 Insomnia, unspecified   . Myasthenia gravis without exacerbation   . Viral hepatitis B without mention of hepatic coma, chronic, without mention of hepatitis delta   . Other and unspecified hyperlipidemia   . Type II or unspecified type diabetes mellitus without mention of complication, not stated as uncontrolled   . Depressive disorder, not elsewhere classified   . Anxiety   . Internal hemorrhoids   . External hemorrhoid   . Colon polyp     Past Surgical History  Procedure Laterality Date  . Thymectomy      History   Social History  . Marital Status: Married    Spouse Name: N/A  . Number of Children: N/A  . Years of Education: N/A   Occupational History  . Not on file.   Social History Main Topics  . Smoking status: Never Smoker   . Smokeless tobacco: Not on file  . Alcohol Use: No  . Drug Use: No  . Sexual Activity: Not on file   Other Topics Concern  . Not on file   Social History Narrative    Current Outpatient Prescriptions on File Prior to Visit  Medication Sig Dispense Refill  . acetaminophen (TYLENOL) 500 MG tablet Take 1,000 mg by mouth as needed.      Marland Kitchen ampicillin (PRINCIPEN) 500 MG capsule     . benzonatate (TESSALON) 200 MG capsule Take 1 capsule (200 mg total) by mouth 2 (two) times daily as needed for cough. 20 capsule 0  . calcium carbonate  (OS-CAL) 600 MG TABS Take 600 mg by mouth 3 (three) times daily with meals.      . Cholecalciferol (VITAMIN D) 2000 UNITS tablet Take 2,000 Units by mouth daily.      Marland Kitchen entecavir (BARACLUDE) 1 MG tablet Take 1 tablet (1 mg total) by mouth daily. 30 tablet 6  . fluocinonide ointment (LIDEX) 0.05 % APPLY TO AFFECTED AREA EVERY DAY AS NEEDED. 30 g 3  . FLUoxetine (PROZAC) 20 MG tablet Take 1 tablet (20 mg total) by mouth as needed. 90 tablet 3  . fluticasone (FLONASE) 50 MCG/ACT nasal spray Place 2 sprays into both nostrils daily. 48 g 0  . glucose blood (ONE TOUCH ULTRA TEST) test strip Use to check blood sugar 1xday. 100 each 3  . hydrocortisone 2.5 % cream APPLY TO AFFECTED AREA FOUR TIMES A DAY FOR RASH AS NEEDED 56 g 3  . ketoconazole (NIZORAL) 2 % cream     . Lancets (ONETOUCH ULTRASOFT) lancets USE AS DIRECTED 100 each 3  . metFORMIN (GLUCOPHAGE-XR) 500 MG 24 hr tablet TAKE 2 TABLETS TWICE A DAY 360 tablet 0  . MICRONIZED COLESTIPOL HCL 1 G tablet TAKE 5 TABLETS (5 G TOTAL) BY MOUTH DAILY. 450 tablet 0  . omeprazole (PRILOSEC) 20 MG capsule Take 1 capsule (20 mg total) by mouth daily. 30 capsule 5  .  omeprazole (PRILOSEC) 20 MG capsule TAKE 1 CAPSULE BY MOUTH ONCE DAILY 30 capsule 5  . pyridostigmine (MESTINON) 60 MG tablet Take 60 mg by mouth as needed.     . Sulfacetamide Sodium, Acne, 10 % LOTN     . zolpidem (AMBIEN) 10 MG tablet TAKE 1 TABLET AT BEDTIME AS NEEDED 30 tablet 5   No current facility-administered medications on file prior to visit.    Allergies  Allergen Reactions  . Azithromycin     REACTION: exac of his mg  . Beta Adrenergic Blockers     REACTION: exac of mg    No family history on file.  BP 122/84 mmHg  Pulse 99  Temp(Src) 98.3 F (36.8 C) (Oral)  Ht 5\' 5"  (1.651 m)  Wt 130 lb (58.968 kg)  BMI 21.63 kg/m2  SpO2 97%     Review of Systems denies chest pain, foot numbness, and sob.     Objective:   Physical Exam VITAL SIGNS:  See vs page GENERAL:  no distress head: no deformity eyes: no periorbital swelling, no proptosis external nose and ears are normal mouth: no lesion seen Both eac's and tm's are normal. Pulses: dorsalis pedis intact bilat.   MSK: no deformity of the feet CV: no leg edema Skin:  no ulcer on the feet.  normal color and temp on the feet. Neuro: sensation is intact to touch on the feet   Lab Results  Component Value Date   HGBA1C 6.0 03/30/2014   Lab Results  Component Value Date   TSH 0.68 03/30/2014   Lab Results  Component Value Date   PTH 20 09/23/2013   CALCIUM 10.6* 03/30/2014   CAION 1.11* 03/11/2010       Assessment & Plan:  Chronic headache, worse Mild hyperthyroidism, resolved, but he is at risk for recurrence.   Hypercalcemia, new, uncertain etiology.   Patient is advised the following: Patient Instructions  blood tests are being requested for you today.  We'll let you know about the results. Please call if you decide to see a specialist for the headache.  Please come back for a "medicare wellness" appointment in 7 months (must be after 09/30/14).  You are also at risk for Addison's dx, thyroid problems, vitiligo, low testosterone, celiac dz, and low vitamin B-12.     Addendum: come in for PTH

## 2014-03-31 ENCOUNTER — Other Ambulatory Visit: Payer: Self-pay | Admitting: Endocrinology

## 2014-04-15 ENCOUNTER — Other Ambulatory Visit: Payer: Medicare Other

## 2014-04-15 DIAGNOSIS — G7000 Myasthenia gravis without (acute) exacerbation: Secondary | ICD-10-CM | POA: Diagnosis not present

## 2014-04-15 DIAGNOSIS — Z79899 Other long term (current) drug therapy: Secondary | ICD-10-CM | POA: Diagnosis not present

## 2014-04-16 ENCOUNTER — Telehealth: Payer: Self-pay | Admitting: Endocrinology

## 2014-04-16 DIAGNOSIS — B181 Chronic viral hepatitis B without delta-agent: Secondary | ICD-10-CM | POA: Diagnosis not present

## 2014-04-16 LAB — PTH, INTACT AND CALCIUM
Calcium: 9.5 mg/dL (ref 8.4–10.5)
PTH: 20 pg/mL (ref 14–64)

## 2014-04-16 NOTE — Telephone Encounter (Signed)
Contacted pharmacy. Metformin does not require a PA. Form was sent to our office in error.

## 2014-04-16 NOTE — Telephone Encounter (Signed)
please call [facility]: Metformin-ER is tier-1 i need to know why this is prior [place].

## 2014-04-23 DIAGNOSIS — K828 Other specified diseases of gallbladder: Secondary | ICD-10-CM | POA: Diagnosis not present

## 2014-04-23 DIAGNOSIS — Z8619 Personal history of other infectious and parasitic diseases: Secondary | ICD-10-CM | POA: Diagnosis not present

## 2014-04-23 DIAGNOSIS — B1910 Unspecified viral hepatitis B without hepatic coma: Secondary | ICD-10-CM | POA: Diagnosis not present

## 2014-06-17 DIAGNOSIS — Z79899 Other long term (current) drug therapy: Secondary | ICD-10-CM | POA: Diagnosis not present

## 2014-06-17 DIAGNOSIS — G7000 Myasthenia gravis without (acute) exacerbation: Secondary | ICD-10-CM | POA: Diagnosis not present

## 2014-07-01 DIAGNOSIS — Z79899 Other long term (current) drug therapy: Secondary | ICD-10-CM | POA: Diagnosis not present

## 2014-07-01 DIAGNOSIS — G7000 Myasthenia gravis without (acute) exacerbation: Secondary | ICD-10-CM | POA: Diagnosis not present

## 2014-07-03 ENCOUNTER — Other Ambulatory Visit: Payer: Self-pay | Admitting: Endocrinology

## 2014-07-03 DIAGNOSIS — Z79899 Other long term (current) drug therapy: Secondary | ICD-10-CM | POA: Diagnosis not present

## 2014-07-03 DIAGNOSIS — G7000 Myasthenia gravis without (acute) exacerbation: Secondary | ICD-10-CM | POA: Diagnosis not present

## 2014-07-06 DIAGNOSIS — I959 Hypotension, unspecified: Secondary | ICD-10-CM | POA: Diagnosis not present

## 2014-07-06 DIAGNOSIS — G7000 Myasthenia gravis without (acute) exacerbation: Secondary | ICD-10-CM | POA: Diagnosis not present

## 2014-07-06 DIAGNOSIS — Z79899 Other long term (current) drug therapy: Secondary | ICD-10-CM | POA: Diagnosis not present

## 2014-07-06 DIAGNOSIS — G7001 Myasthenia gravis with (acute) exacerbation: Secondary | ICD-10-CM | POA: Diagnosis not present

## 2014-07-08 DIAGNOSIS — G7000 Myasthenia gravis without (acute) exacerbation: Secondary | ICD-10-CM | POA: Diagnosis not present

## 2014-07-10 DIAGNOSIS — Z79899 Other long term (current) drug therapy: Secondary | ICD-10-CM | POA: Diagnosis not present

## 2014-07-10 DIAGNOSIS — G7000 Myasthenia gravis without (acute) exacerbation: Secondary | ICD-10-CM | POA: Diagnosis not present

## 2014-08-05 ENCOUNTER — Other Ambulatory Visit: Payer: Self-pay | Admitting: Endocrinology

## 2014-09-19 ENCOUNTER — Other Ambulatory Visit: Payer: Self-pay | Admitting: Endocrinology

## 2014-09-21 ENCOUNTER — Other Ambulatory Visit: Payer: Self-pay

## 2014-09-21 MED ORDER — ONETOUCH ULTRASOFT LANCETS MISC: Status: DC

## 2014-09-21 MED ORDER — GLUCOSE BLOOD VI STRP: ORAL_STRIP | Status: DC

## 2014-10-04 ENCOUNTER — Other Ambulatory Visit: Payer: Self-pay | Admitting: Endocrinology

## 2014-10-06 DIAGNOSIS — Z79899 Other long term (current) drug therapy: Secondary | ICD-10-CM | POA: Diagnosis not present

## 2014-10-06 DIAGNOSIS — G7000 Myasthenia gravis without (acute) exacerbation: Secondary | ICD-10-CM | POA: Diagnosis not present

## 2014-10-06 DIAGNOSIS — C37 Malignant neoplasm of thymus: Secondary | ICD-10-CM | POA: Diagnosis not present

## 2014-10-06 DIAGNOSIS — G7001 Myasthenia gravis with (acute) exacerbation: Secondary | ICD-10-CM | POA: Diagnosis not present

## 2014-10-07 ENCOUNTER — Other Ambulatory Visit: Payer: Self-pay | Admitting: Endocrinology

## 2014-10-28 DIAGNOSIS — D150 Benign neoplasm of thymus: Secondary | ICD-10-CM | POA: Diagnosis not present

## 2014-10-28 DIAGNOSIS — C37 Malignant neoplasm of thymus: Secondary | ICD-10-CM | POA: Diagnosis not present

## 2014-11-02 ENCOUNTER — Other Ambulatory Visit: Payer: Self-pay | Admitting: Endocrinology

## 2014-11-03 ENCOUNTER — Ambulatory Visit (INDEPENDENT_AMBULATORY_CARE_PROVIDER_SITE_OTHER): Payer: Medicare Other | Admitting: Endocrinology

## 2014-11-03 ENCOUNTER — Encounter: Payer: Self-pay | Admitting: Endocrinology

## 2014-11-03 VITALS — BP 136/87 | HR 102 | Temp 97.5°F | Ht 65.0 in | Wt 128.0 lb

## 2014-11-03 DIAGNOSIS — E119 Type 2 diabetes mellitus without complications: Secondary | ICD-10-CM | POA: Diagnosis not present

## 2014-11-03 LAB — POCT GLYCOSYLATED HEMOGLOBIN (HGB A1C): HEMOGLOBIN A1C: 6.6

## 2014-11-03 MED ORDER — FLUOXETINE HCL 20 MG PO CAPS: 20.0000 mg | ORAL_CAPSULE | Freq: Every day | ORAL | Status: DC

## 2014-11-03 MED ORDER — ALPRAZOLAM 0.25 MG PO TABS: 0.2500 mg | ORAL_TABLET | Freq: Three times a day (TID) | ORAL | Status: DC | PRN

## 2014-11-03 MED ORDER — SITAGLIPTIN PHOSPHATE 100 MG PO TABS: 100.0000 mg | ORAL_TABLET | Freq: Every day | ORAL | Status: DC

## 2014-11-03 NOTE — Progress Notes (Signed)
Subjective:    Patient ID: [name], male    DOB: 05-28-1955, 59 y.o.   MRN: [id number]  HPI  Pt returns for f/u of diabetes mellitus: DM type: 2, but he is presumed to be evolving type 1, given lean body habitus, MG, and neg FHx.   Dx'ed: 3710 Complications: none Therapy: metformin DKA: never Severe hypoglycemia: never Pancreatitis: never Other: he has never been on insulin Interval history: cbg's vary from 100-300's, but mostly in the 100's. Pt has 4 months of a moderate flare of his MG, worse at the eyelids (R>L).  He has assoc intermittent headache.  Despite plasmapheresis and increased prednisone at [facility], sxs are only slightly improved.  He has a f/u appointment in 2 days.   Past Medical History  Diagnosis Date  . 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 Insomnia, unspecified   . Myasthenia gravis without exacerbation ([place])   . Viral hepatitis B without mention of hepatic coma, chronic, without mention of hepatitis delta   . Other and unspecified hyperlipidemia   . Type II or unspecified type diabetes mellitus without mention of complication, not stated as uncontrolled   . Depressive disorder, not elsewhere classified   . Anxiety   . Internal hemorrhoids   . External hemorrhoid   . Colon polyp     Past Surgical History  Procedure Laterality Date  . Thymectomy      Social History   Social History  . Marital Status: Married    Spouse Name: N/A  . Number of Children: N/A  . Years of Education: N/A   Occupational History  . Not on file.   Social History Main Topics  . Smoking status: Never Smoker   . Smokeless tobacco: Not on file  . Alcohol Use: No  . Drug Use: No  . Sexual Activity: Not on file   Other Topics Concern  . Not on file   Social History Narrative    Current Outpatient Prescriptions on File Prior to Visit  Medication Sig Dispense Refill  . calcium carbonate (OS-CAL) 600 MG TABS Take 600 mg by mouth 3 (three) times daily  with meals.      . Cholecalciferol (VITAMIN D) 2000 UNITS tablet Take 2,000 Units by mouth daily.      Marland Kitchen entecavir (BARACLUDE) 1 MG tablet Take 1 tablet (1 mg total) by mouth daily. 30 tablet 6  . fluticasone (FLONASE) 50 MCG/ACT nasal spray Place 2 sprays into both nostrils daily. 48 g 0  . glucose blood (ONE TOUCH ULTRA TEST) test strip USE TO CHECK BLOOD SUGAR 1XDAY. DX CODE: E11.9 100 each 2  . ketoconazole (NIZORAL) 2 % cream     . MICRONIZED COLESTIPOL HCL 1 G tablet TAKE 5 TABLETS BY MOUTH DAILY. 450 tablet 0  . omeprazole (PRILOSEC) 20 MG capsule Take 1 capsule (20 mg total) by mouth daily. 30 capsule 5  . pyridostigmine (MESTINON) 60 MG tablet Take 60 mg by mouth 3 (three) times daily.     . Sulfacetamide Sodium, Acne, 10 % LOTN     . zolpidem (AMBIEN) 10 MG tablet TAKE 1 TABLET AT BEDTIME AS NEEDED 30 tablet 5  . Lancets (ONETOUCH ULTRASOFT) lancets CHECK BLOOD SUGAR 1 TIME PER DAY. DX CODE E11.9 100 each 0  . metFORMIN (GLUCOPHAGE-XR) 500 MG 24 hr tablet TAKE 2 TABLETS BY MOUTH TWICE DAILY 360 tablet 0   No current facility-administered medications on file prior to visit.    Allergies  Allergen Reactions  . Azithromycin     REACTION: exac of his mg  . Beta Adrenergic Blockers     REACTION: exac of mg   No family history on file.  BP 136/87 mmHg  Pulse 102  Temp(Src) 97.5 F (36.4 C) (Oral)  Ht 5\' 5"  (1.651 m)  Wt 128 lb (58.06 kg)  BMI 21.30 kg/m2  SpO2 97%  Review of Systems He also has insomnia and anxiety.  No numbness.      Objective:   Physical Exam VITAL SIGNS:  See vs page GENERAL: no distress. Neuro: right eyelid is almost completely closed; left eyelid is weak, but to a lesser degree.  Gait: normal and steady.    A1c=6.6%.     Assessment & Plan:  DM: Needs increased rx, if it can be done with a regimen that avoids or minimizes hypoglycemia, especially with varying dosages of steriods. Anxiety: apparently exac by MG exacerbation  Patient is  advised the following: Patient Instructions  Here is a prescription for the anxiety.  Due to an interaction, do not take this with ambien Please continue the same prozac (this you take daily, rather than as needed) i have sent a prescription to your pharmacy, for an additional diabetes medication. Please come back for a follow-up appointment in 2 weeks.

## 2014-11-03 NOTE — Patient Instructions (Addendum)
Here is a prescription for the anxiety.  Due to an interaction, do not take this with ambien Please continue the same prozac (this you take daily, rather than as needed) i have sent a prescription to your pharmacy, for an additional diabetes medication. Please come back for a follow-up appointment in 2 weeks.

## 2014-11-05 DIAGNOSIS — G7000 Myasthenia gravis without (acute) exacerbation: Secondary | ICD-10-CM | POA: Diagnosis not present

## 2014-11-05 DIAGNOSIS — Z8659 Personal history of other mental and behavioral disorders: Secondary | ICD-10-CM | POA: Diagnosis not present

## 2014-11-10 DIAGNOSIS — G7000 Myasthenia gravis without (acute) exacerbation: Secondary | ICD-10-CM | POA: Diagnosis not present

## 2014-11-10 DIAGNOSIS — E119 Type 2 diabetes mellitus without complications: Secondary | ICD-10-CM | POA: Diagnosis not present

## 2014-11-19 ENCOUNTER — Ambulatory Visit (INDEPENDENT_AMBULATORY_CARE_PROVIDER_SITE_OTHER): Payer: Medicare Other | Admitting: Endocrinology

## 2014-11-19 VITALS — BP 118/84 | HR 79 | Temp 98.3°F | Resp 16 | Ht 65.0 in | Wt 127.0 lb

## 2014-11-19 DIAGNOSIS — Z0000 Encounter for general adult medical examination without abnormal findings: Secondary | ICD-10-CM

## 2014-11-19 DIAGNOSIS — E7800 Pure hypercholesterolemia, unspecified: Secondary | ICD-10-CM

## 2014-11-19 DIAGNOSIS — Z125 Encounter for screening for malignant neoplasm of prostate: Secondary | ICD-10-CM

## 2014-11-19 DIAGNOSIS — Z23 Encounter for immunization: Secondary | ICD-10-CM | POA: Diagnosis not present

## 2014-11-19 DIAGNOSIS — E079 Disorder of thyroid, unspecified: Secondary | ICD-10-CM | POA: Diagnosis not present

## 2014-11-19 DIAGNOSIS — B181 Chronic viral hepatitis B without delta-agent: Secondary | ICD-10-CM | POA: Diagnosis not present

## 2014-11-19 DIAGNOSIS — E119 Type 2 diabetes mellitus without complications: Secondary | ICD-10-CM

## 2014-11-19 LAB — HEPATIC FUNCTION PANEL
ALT: 22 U/L (ref 0–53)
AST: 20 U/L (ref 0–37)
Albumin: 4.5 g/dL (ref 3.5–5.2)
Alkaline Phosphatase: 36 U/L — ABNORMAL LOW (ref 39–117)
BILIRUBIN TOTAL: 1 mg/dL (ref 0.2–1.2)
Bilirubin, Direct: 0.2 mg/dL (ref 0.0–0.3)
Total Protein: 6.8 g/dL (ref 6.0–8.3)

## 2014-11-19 LAB — URINALYSIS, ROUTINE W REFLEX MICROSCOPIC
Bilirubin Urine: NEGATIVE
Ketones, ur: NEGATIVE
LEUKOCYTES UA: NEGATIVE
NITRITE: NEGATIVE
SPECIFIC GRAVITY, URINE: 1.02 (ref 1.000–1.030)
TOTAL PROTEIN, URINE-UPE24: NEGATIVE
URINE GLUCOSE: NEGATIVE
Urobilinogen, UA: 0.2 (ref 0.0–1.0)
WBC, UA: NONE SEEN (ref 0–?)
pH: 6 (ref 5.0–8.0)

## 2014-11-19 LAB — LIPID PANEL
CHOLESTEROL: 188 mg/dL (ref 0–200)
HDL: 48.6 mg/dL (ref >39.00)
NonHDL: 139.4
Total CHOL/HDL Ratio: 4
Triglycerides: 217 mg/dL — ABNORMAL HIGH (ref 0.0–149.0)
VLDL: 43.4 mg/dL — AB (ref 0.0–40.0)

## 2014-11-19 LAB — BASIC METABOLIC PANEL
BUN: 15 mg/dL (ref 6–23)
CHLORIDE: 101 meq/L (ref 96–112)
CO2: 33 mEq/L — ABNORMAL HIGH (ref 19–32)
CREATININE: 0.89 mg/dL (ref 0.40–1.50)
Calcium: 10.7 mg/dL — ABNORMAL HIGH (ref 8.4–10.5)
GFR: 93.21 mL/min (ref >60.00)
GLUCOSE: 109 mg/dL — AB (ref 70–99)
POTASSIUM: 4.2 meq/L (ref 3.5–5.1)
Sodium: 142 mEq/L (ref 135–145)

## 2014-11-19 LAB — CBC WITH DIFFERENTIAL/PLATELET
Basophils Absolute: 0 10*3/uL (ref 0.0–0.1)
Basophils Relative: 0.4 % (ref 0.0–3.0)
EOS PCT: 2.6 % (ref 0.0–5.0)
Eosinophils Absolute: 0.2 10*3/uL (ref 0.0–0.7)
HEMATOCRIT: 45.9 % (ref 39.0–52.0)
HEMOGLOBIN: 15.4 g/dL (ref 13.0–17.0)
Lymphocytes Relative: 39.5 % (ref 12.0–46.0)
Lymphs Abs: 2.5 10*3/uL (ref 0.7–4.0)
MCHC: 33.7 g/dL (ref 30.0–36.0)
MCV: 88.1 fl (ref 78.0–100.0)
MONOS PCT: 8.9 % (ref 3.0–12.0)
Monocytes Absolute: 0.6 10*3/uL (ref 0.1–1.0)
Neutro Abs: 3 10*3/uL (ref 1.4–7.7)
Neutrophils Relative %: 48.6 % (ref 43.0–77.0)
PLATELETS: 191 10*3/uL (ref 150.0–400.0)
RBC: 5.21 Mil/uL (ref 4.22–5.81)
RDW: 13.1 % (ref 11.5–15.5)
WBC: 6.3 10*3/uL (ref 4.0–10.5)

## 2014-11-19 LAB — MICROALBUMIN / CREATININE URINE RATIO
CREATININE, U: 232.2 mg/dL
Microalb Creat Ratio: 0.6 mg/g (ref 0.0–30.0)
Microalb, Ur: 1.5 mg/dL (ref 0.0–1.9)

## 2014-11-19 LAB — LDL CHOLESTEROL, DIRECT: Direct LDL: 118 mg/dL

## 2014-11-19 NOTE — Progress Notes (Signed)
Subjective:    Patient ID: [name], male    DOB: 1955/12/29, 59 y.o.   MRN: [id number]  HPI Pt returns for f/u of diabetes mellitus: DM type: 2, but he is presumed to be evolving type 1, given lean body habitus, MG, and neg FHx.   Dx'ed: 9381 Complications: none Therapy: metformin DKA: never Severe hypoglycemia: never Pancreatitis: never Other: he has never been on insulin Interval history: no cbg record, but states cbg's vary from 96-200.  It is in general higher as the day goes on.   Anxiety is much better.  He takes xanax just 1 per day.   Past Medical History  Diagnosis Date  . 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 Insomnia, unspecified   . Myasthenia gravis without exacerbation ([place])   . Viral hepatitis B without mention of hepatic coma, chronic, without mention of hepatitis delta   . Other and unspecified hyperlipidemia   . Type II or unspecified type diabetes mellitus without mention of complication, not stated as uncontrolled   . Depressive disorder, not elsewhere classified   . Anxiety   . Internal hemorrhoids   . External hemorrhoid   . Colon polyp     Past Surgical History  Procedure Laterality Date  . Thymectomy      Social History   Social History  . Marital Status: Married    Spouse Name: N/A  . Number of Children: N/A  . Years of Education: N/A   Occupational History  . Not on file.   Social History Main Topics  . Smoking status: Never Smoker   . Smokeless tobacco: Not on file  . Alcohol Use: No  . Drug Use: No  . Sexual Activity: Not on file   Other Topics Concern  . Not on file   Social History Narrative    Current Outpatient Prescriptions on File Prior to Visit  Medication Sig Dispense Refill  . ALPRAZolam (XANAX) 0.25 MG tablet Take 1 tablet (0.25 mg total) by mouth 3 (three) times daily as needed for anxiety. 50 tablet 1  . calcium carbonate (OS-CAL) 600 MG TABS Take 600 mg by mouth 3 (three) times daily with  meals.      . Cholecalciferol (VITAMIN D) 2000 UNITS tablet Take 2,000 Units by mouth daily.      Marland Kitchen entecavir (BARACLUDE) 1 MG tablet Take 1 tablet (1 mg total) by mouth daily. 30 tablet 6  . FLUoxetine (PROZAC) 20 MG capsule Take 1 capsule (20 mg total) by mouth daily. 90 capsule 2  . fluticasone (FLONASE) 50 MCG/ACT nasal spray Place 2 sprays into both nostrils daily. 48 g 0  . Ibuprofen (ADVIL) 200 MG CAPS Take by mouth.    Marland Kitchen ketoconazole (NIZORAL) 2 % cream     . metFORMIN (GLUCOPHAGE-XR) 500 MG 24 hr tablet TAKE 2 TABLETS BY MOUTH TWICE DAILY 360 tablet 0  . MICRONIZED COLESTIPOL HCL 1 G tablet TAKE 5 TABLETS BY MOUTH DAILY. 450 tablet 0  . omeprazole (PRILOSEC) 20 MG capsule Take 1 capsule (20 mg total) by mouth daily. 30 capsule 5  . predniSONE (DELTASONE) 5 MG tablet TAKE 5 TABLETS (25 MG TOTAL) BY MOUTH EVERY OTHER DAY.  2  . pyridostigmine (MESTINON) 60 MG tablet Take 60 mg by mouth 3 (three) times daily.     . sitaGLIPtin (JANUVIA) 100 MG tablet Take 1 tablet (100 mg total) by mouth daily. 30 tablet 11  . Sulfacetamide Sodium, Acne, 10 % LOTN     .  zolpidem (AMBIEN) 10 MG tablet TAKE 1 TABLET AT BEDTIME AS NEEDED 30 tablet 5  . glucose blood (ONE TOUCH ULTRA TEST) test strip USE TO CHECK BLOOD SUGAR 1XDAY. DX CODE: E11.9 100 each 2  . Lancets (ONETOUCH ULTRASOFT) lancets CHECK BLOOD SUGAR 1 TIME PER DAY. DX CODE E11.9 100 each 0   No current facility-administered medications on file prior to visit.    Allergies  Allergen Reactions  . Azithromycin     REACTION: exac of his mg  . Beta Adrenergic Blockers     REACTION: exac of mg    No family history on file.  BP 118/84 mmHg  Pulse 79  Temp(Src) 98.3 F (36.8 C) (Oral)  Resp 16  Ht 5\' 5"  (1.651 m)  Wt 127 lb (57.607 kg)  BMI 21.13 kg/m2  SpO2 97%  Review of Systems No weight change    Objective:   Physical Exam VITAL SIGNS:  See vs page GENERAL: no distress Pulses: dorsalis pedis intact bilat.   MSK: no  deformity of the feet CV: no leg edema Skin:  no ulcer on the feet.  normal color and temp on the feet. Neuro: sensation is intact to touch on the feet  Lab Results  Component Value Date   PTH 20 04/15/2014   CALCIUM 10.7* 11/19/2014   CAION 1.11* 03/11/2010       Assessment & Plan:  Anxiety: Try reducing the alprazolam to 1/2 pill per day, as needed.  If this goes well, you can stop it altogether. Depression: well-controlled.  please continue the same prozac.  DM: well-controlled.  Please continue the same medications Hypercalcemia: new, uncertain etiology.  We'll recheck in the future.  Subjective:   Patient here for Medicare annual wellness visit and management of other chronic and acute problems.     Risk factors: med problems.    Roster of Physicians Providing Medical Care to Patient:  See "snapshot"     Activities of Daily Living: In your present state of health, do you have any difficulty performing the following activities?:  Preparing food and eating?: No  Bathing yourself: No  Getting dressed: No  Using the toilet:No  Moving around from place to place: No  In the past year have you fallen or had a near fall?: No    Home Safety: Has smoke detector and wears seat belts. No firearms.  Diet and Exercise  Current exercise habits: as tolerated by MG Dietary issues discussed: pt reports a healthy diet   Depression Screen  Q1: Over the past two weeks, have you felt down, depressed or hopeless? no  Q2: Over the past two weeks, have you felt little interest or pleasure in doing things? no   The following portions of the patient's history were reviewed and updated as appropriate: allergies, current medications, past family history, past medical history, past social history, past surgical history and problem list.   Review of Systems  Denies hearing loss, and visual loss Objective:   Vision:  Sees opthalmologist Hearing: grossly normal Body mass index:  See vs  page Msk: pt easily and quickly performs "get-up-and-go" from a sitting position Cognitive Impairment Assessment: cognition, memory and judgment appear normal.  remembers 3/3 at 5 minutes.  excellent recall.  can easily read and write a sentence.  alert and oriented x 3.     Assessment:   Medicare wellness utd on preventive parameters    Plan:   During the course of the visit the patient was educated  and counseled about appropriate screening and preventive services including:       Fall prevention   Bone densitometry screening  Diabetes screening  Nutrition counseling   Vaccines / LABS Zostavax / Pneumococcal Vaccine  today  PSA  Patient Instructions (the written plan) was given to the patient.

## 2014-11-19 NOTE — Patient Instructions (Addendum)
Try reducing the alprazolam to 1/2 pill per day, as needed. If this goes well, you can stop it altogether. Please continue the same prozac.   Please come back for a follow-up appointment in 3 months.   please consider these measures for your health:  minimize alcohol.  do not use tobacco products.  have a colonoscopy at least every 10 years from age 59.  keep firearms safely stored.  always use seat belts.  have working smoke alarms in your home.  see an eye doctor and dentist regularly.  never drive under the influence of alcohol or drugs (including prescription drugs).

## 2014-11-19 NOTE — Progress Notes (Signed)
Pre visit review using our clinic review tool, if applicable. No additional management support is needed unless otherwise documented below in the visit note. 

## 2014-11-19 NOTE — Progress Notes (Signed)
we discussed code status.  pt requests full code, but would not want to be started or maintained on artificial life-support measures if there was not a reasonable chance of recovery 

## 2014-11-20 ENCOUNTER — Encounter: Payer: Self-pay | Admitting: *Deleted

## 2014-11-20 LAB — TSH: TSH: 0.59 u[IU]/mL (ref 0.35–4.50)

## 2014-11-20 LAB — PSA, MEDICARE: PSA: 0.79 ng/ml (ref 0.10–4.00)

## 2014-11-26 DIAGNOSIS — B181 Chronic viral hepatitis B without delta-agent: Secondary | ICD-10-CM | POA: Diagnosis not present

## 2014-12-05 ENCOUNTER — Other Ambulatory Visit: Payer: Self-pay | Admitting: Endocrinology

## 2014-12-10 DIAGNOSIS — L219 Seborrheic dermatitis, unspecified: Secondary | ICD-10-CM | POA: Diagnosis not present

## 2014-12-10 DIAGNOSIS — L700 Acne vulgaris: Secondary | ICD-10-CM | POA: Diagnosis not present

## 2015-01-05 DIAGNOSIS — D150 Benign neoplasm of thymus: Secondary | ICD-10-CM | POA: Diagnosis not present

## 2015-01-05 DIAGNOSIS — Z923 Personal history of irradiation: Secondary | ICD-10-CM | POA: Diagnosis not present

## 2015-01-05 DIAGNOSIS — H02403 Unspecified ptosis of bilateral eyelids: Secondary | ICD-10-CM | POA: Diagnosis not present

## 2015-01-05 DIAGNOSIS — G7000 Myasthenia gravis without (acute) exacerbation: Secondary | ICD-10-CM | POA: Diagnosis not present

## 2015-01-05 DIAGNOSIS — Z9889 Other specified postprocedural states: Secondary | ICD-10-CM | POA: Diagnosis not present

## 2015-01-05 DIAGNOSIS — B1920 Unspecified viral hepatitis C without hepatic coma: Secondary | ICD-10-CM | POA: Diagnosis not present

## 2015-01-05 DIAGNOSIS — R911 Solitary pulmonary nodule: Secondary | ICD-10-CM | POA: Diagnosis not present

## 2015-01-05 DIAGNOSIS — Z79899 Other long term (current) drug therapy: Secondary | ICD-10-CM | POA: Diagnosis not present

## 2015-01-05 DIAGNOSIS — F419 Anxiety disorder, unspecified: Secondary | ICD-10-CM | POA: Diagnosis not present

## 2015-02-02 ENCOUNTER — Telehealth: Payer: Self-pay | Admitting: Endocrinology

## 2015-02-02 ENCOUNTER — Other Ambulatory Visit: Payer: Self-pay

## 2015-02-02 MED ORDER — ONETOUCH ULTRASOFT LANCETS MISC: Status: DC

## 2015-02-02 MED ORDER — METFORMIN HCL ER 500 MG PO TB24: 1000.0000 mg | ORAL_TABLET | Freq: Two times a day (BID) | ORAL | Status: DC

## 2015-02-02 MED ORDER — FLUTICASONE PROPIONATE 50 MCG/ACT NA SUSP: 2.0000 | Freq: Every day | NASAL | Status: DC

## 2015-02-02 MED ORDER — SITAGLIPTIN PHOSPHATE 100 MG PO TABS: 100.0000 mg | ORAL_TABLET | Freq: Every day | ORAL | Status: DC

## 2015-02-02 MED ORDER — GLUCOSE BLOOD VI STRP: ORAL_STRIP | Status: DC

## 2015-02-02 MED ORDER — OMEPRAZOLE 20 MG PO CPDR: 20.0000 mg | DELAYED_RELEASE_CAPSULE | Freq: Every day | ORAL | Status: DC

## 2015-02-02 NOTE — Telephone Encounter (Signed)
Rx's submitted per pt's request.  

## 2015-02-02 NOTE — Telephone Encounter (Signed)
Patient need a prescription refill of all his medications send to [facility] phone # [phone]  Fax # [phone]

## 2015-02-03 ENCOUNTER — Telehealth: Payer: Self-pay | Admitting: Endocrinology

## 2015-02-03 NOTE — Telephone Encounter (Signed)
I contacted Baker Janus at [facility]. She stated a recent CT scan would have to have been done with [place] [facility] before the radiologist could compare the CT reports.

## 2015-02-03 NOTE — Telephone Encounter (Addendum)
Left a voicemail requesting a call back from [facility].

## 2015-02-03 NOTE — Telephone Encounter (Signed)
please call radiology: Chest CT at [place] last week shows ? Of a new nodule. They recommend comparison to out last CT done here. How do we go about this?

## 2015-02-09 ENCOUNTER — Other Ambulatory Visit: Payer: Self-pay

## 2015-02-09 MED ORDER — GLUCOSE BLOOD VI STRP: ORAL_STRIP | Status: DC

## 2015-02-09 MED ORDER — ONETOUCH ULTRASOFT LANCETS MISC: Status: DC

## 2015-02-19 ENCOUNTER — Encounter: Payer: Self-pay | Admitting: Endocrinology

## 2015-02-19 ENCOUNTER — Ambulatory Visit (INDEPENDENT_AMBULATORY_CARE_PROVIDER_SITE_OTHER): Payer: Medicare Other | Admitting: Endocrinology

## 2015-02-19 DIAGNOSIS — R911 Solitary pulmonary nodule: Secondary | ICD-10-CM | POA: Diagnosis not present

## 2015-02-19 DIAGNOSIS — G4700 Insomnia, unspecified: Secondary | ICD-10-CM | POA: Diagnosis not present

## 2015-02-19 DIAGNOSIS — E119 Type 2 diabetes mellitus without complications: Secondary | ICD-10-CM | POA: Diagnosis not present

## 2015-02-19 LAB — POCT GLYCOSYLATED HEMOGLOBIN (HGB A1C): HEMOGLOBIN A1C: 6.2

## 2015-02-19 LAB — VITAMIN D 25 HYDROXY (VIT D DEFICIENCY, FRACTURES): VITD: 67.62 ng/mL (ref 30.00–100.00)

## 2015-02-19 MED ORDER — ZOLPIDEM TARTRATE 10 MG PO TABS: 10.0000 mg | ORAL_TABLET | Freq: Every evening | ORAL | Status: DC | PRN

## 2015-02-19 MED ORDER — COLESTIPOL HCL 1 G PO TABS: ORAL_TABLET | ORAL | Status: DC

## 2015-02-19 NOTE — Patient Instructions (Addendum)
Please come back for a follow-up appointment in 3-4 months.   Please continue the same medications for diabetes.  You can stop taking the alprazolam.  Please continue the same prozac.  Please continue the same ambien.  Here is a prescription.  Please recheck the CT scan in 3 months.

## 2015-02-19 NOTE — Progress Notes (Signed)
Subjective:    Patient ID: [name], male    DOB: 1955-03-30, 60 y.o.   MRN: [id number]  HPI Pt returns for f/u of diabetes mellitus: DM type: 2, but he is presumed to be evolving type 1, given lean body habitus, MG, and neg FHx.   Dx'ed: [id number] Complications: none Therapy: metformin DKA: never Severe hypoglycemia: never Pancreatitis: never Other: he has never been on insulin Interval history: no cbg record, but states cbg's vary from 96-200.  It is in general higher as the day goes on.   Anxiety is much better.  He seldom takes xanax, and would like to go of altogether.  He says episodes of anxiety/depression are precip by prednisone Lorrin Mais works well.  He takes it approx 1-2 times per week.   Ab[facility] chest CT was [facility] at [facility].  Pt says an f/u is scheduled.  Pt was noted to have mild hypercalcemia in 2016 (it was normal in 2015). He has never had urolithiasis, thyroid probs, parathyroid probs, sarcoidosis, cancer, PUD, pancreatitis, recent severe injury, or bony fracture.  He does not take A supplements.  Pt has no h/o any of these: [place], or prolonged immobilization.  Pt denies taking antacids, Li++, or HCTZ.  He takes ergocalciferol 2000 units/day Past Medical History  Diagnosis Date  . 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 Insomnia, unspecified   . Myasthenia gravis without exacerbation ([place])   . Viral hepatitis B without mention of hepatic coma, chronic, without mention of hepatitis delta   . Other and unspecified hyperlipidemia   . Type II or unspecified type diabetes mellitus without mention of complication, not stated as uncontrolled   . Depressive disorder, not elsewhere classified   . Anxiety   . Internal hemorrhoids   . External hemorrhoid   . Colon polyp     Past Surgical History  Procedure Laterality Date  . Thymectomy      Social History   Social History  . Marital Status: Married    Spouse Name: N/A  . Number of Children: N/A  . Years  of Education: N/A   Occupational History  . Not on file.   Social History Main Topics  . Smoking status: Never Smoker   . Smokeless tobacco: Not on file  . Alcohol Use: No  . Drug Use: No  . Sexual Activity: Not on file   Other Topics Concern  . Not on file   Social History Narrative    Current Outpatient Prescriptions on File Prior to Visit  Medication Sig Dispense Refill  . calcium carbonate (OS-CAL) 600 MG TABS Take 600 mg by mouth 3 (three) times daily with meals.      . Cholecalciferol (VITAMIN D) 2000 UNITS tablet Take 2,000 Units by mouth daily.      Marland Kitchen entecavir (BARACLUDE) 1 MG tablet Take 1 tablet (1 mg total) by mouth daily. 30 tablet 6  . fluocinonide ointment (LIDEX) 0.05 % APPLY TO AFFECTED AREA EVERY DAY AS NEEDED. 30 g 1  . FLUoxetine (PROZAC) 20 MG capsule Take 1 capsule (20 mg total) by mouth daily. 90 capsule 2  . fluticasone (FLONASE) 50 MCG/ACT nasal spray Place 2 sprays into both nostrils daily. 48 g 0  . glucose blood (ONE TOUCH ULTRA TEST) test strip USE TO CHECK BLOOD SUGAR 1XDAY. DX CODE: E11.9 100 each 2  . Ibuprofen (ADVIL) 200 MG CAPS Take by mouth.    Marland Kitchen ketoconazole (NIZORAL) 2 % cream     .  Lancets (ONETOUCH ULTRASOFT) lancets CHECK BLOOD SUGAR ONCE DAILY DX:E11.9 100 each 0  . metFORMIN (GLUCOPHAGE-XR) 500 MG 24 hr tablet Take 2 tablets (1,000 mg total) by mouth 2 (two) times daily. 360 tablet 1  . omeprazole (PRILOSEC) 20 MG capsule Take 1 capsule (20 mg total) by mouth daily. 30 capsule 5  . predniSONE (DELTASONE) 5 MG tablet TAKE 5 TABLETS (25 MG TOTAL) BY MOUTH EVERY OTHER DAY.  2  . pyridostigmine (MESTINON) 60 MG tablet Take 60 mg by mouth 3 (three) times daily.     . sitaGLIPtin (JANUVIA) 100 MG tablet Take 1 tablet (100 mg total) by mouth daily. 90 tablet 2  . Sulfacetamide Sodium, Acne, 10 % LOTN Reported on 02/19/2015     No current facility-administered medications on file prior to visit.    Allergies  Allergen Reactions  .  Azithromycin     REACTION: exac of his mg  . Beta Adrenergic Blockers     REACTION: exac of mg    No family history on file.  BP 134/88 mmHg  Pulse 83  Temp(Src) 98.3 F (36.8 C) (Oral)  Ht 5\' 5"  (1.651 m)  Wt 125 lb (56.7 kg)  BMI 20.80 kg/m2  SpO2 96%  Review of Systems Denies cough and SOB.      Objective:   Physical Exam VITAL SIGNS:  See vs page GENERAL: no distress Pulses: dorsalis pedis intact bilat.   MSK: no deformity of the feet CV: no leg edema Skin:  no ulcer on the feet.  normal color and temp on the feet. Neuro: sensation is intact to touch on the feet  outside test results are reviewed: I reviewed CT report: new nodule  A1c=6.2% Hypercalcemia: due for recheck    Assessment & Plan:  DM: well-controlled Anxiety/depression: improved Insomnia: persistent. pulm nodule, new to me.   Patient is advised the following: Patient Instructions  Please come back for a follow-up appointment in 3-4 months.   Please continue the same medications for diabetes.  You can stop taking the alprazolam.  Please continue the same prozac.  Please continue the same ambien.  Here is a prescription.  Please recheck the CT scan in 3 months.

## 2015-02-22 LAB — PTH, INTACT AND CALCIUM
CALCIUM: 11.1 mg/dL — AB (ref 8.4–10.5)
PTH: 7 pg/mL — ABNORMAL LOW (ref 14–64)

## 2015-02-23 LAB — PROTEIN ELECTROPHORESIS, SERUM
ALBUMIN ELP: 4.4 g/dL (ref 3.8–4.8)
ALPHA-1-GLOBULIN: 0.2 g/dL (ref 0.2–0.3)
ALPHA-2-GLOBULIN: 0.7 g/dL (ref 0.5–0.9)
BETA 2: 0.3 g/dL (ref 0.2–0.5)
BETA GLOBULIN: 0.4 g/dL (ref 0.4–0.6)
Gamma Globulin: 0.8 g/dL (ref 0.8–1.7)
Total Protein, Serum Electrophoresis: 6.9 g/dL (ref 6.1–8.1)

## 2015-02-23 LAB — VITAMIN D 1,25 DIHYDROXY
Vitamin D 1, 25 (OH)2 Total: 48 pg/mL (ref 18–72)
Vitamin D3 1, 25 (OH)2: 48 pg/mL

## 2015-02-24 LAB — VITAMIN A: Vitamin A (Retinoic Acid): 81 ug/dL (ref 38–98)

## 2015-02-25 LAB — PTH-RELATED PEPTIDE: PTH-RELATED PROTEIN (PTH-RP): 15 pg/mL (ref 14–27)

## 2015-02-26 ENCOUNTER — Other Ambulatory Visit: Payer: Self-pay | Admitting: Endocrinology

## 2015-02-28 ENCOUNTER — Other Ambulatory Visit: Payer: Self-pay | Admitting: Endocrinology

## 2015-03-05 ENCOUNTER — Other Ambulatory Visit: Payer: Self-pay | Admitting: Endocrinology

## 2015-03-15 ENCOUNTER — Other Ambulatory Visit: Payer: Self-pay

## 2015-03-15 MED ORDER — FLUOCINONIDE 0.05 % EX OINT: TOPICAL_OINTMENT | CUTANEOUS | Status: DC

## 2015-03-15 MED ORDER — FLUOXETINE HCL 20 MG PO CAPS: 20.0000 mg | ORAL_CAPSULE | Freq: Every day | ORAL | Status: DC

## 2015-03-17 DIAGNOSIS — D150 Benign neoplasm of thymus: Secondary | ICD-10-CM | POA: Diagnosis not present

## 2015-03-17 DIAGNOSIS — G7000 Myasthenia gravis without (acute) exacerbation: Secondary | ICD-10-CM | POA: Diagnosis not present

## 2015-03-17 DIAGNOSIS — R911 Solitary pulmonary nodule: Secondary | ICD-10-CM | POA: Diagnosis not present

## 2015-04-08 DIAGNOSIS — B181 Chronic viral hepatitis B without delta-agent: Secondary | ICD-10-CM | POA: Diagnosis not present

## 2015-04-29 DIAGNOSIS — B181 Chronic viral hepatitis B without delta-agent: Secondary | ICD-10-CM | POA: Diagnosis not present

## 2015-05-05 DIAGNOSIS — G7000 Myasthenia gravis without (acute) exacerbation: Secondary | ICD-10-CM | POA: Diagnosis not present

## 2015-05-05 DIAGNOSIS — Z79899 Other long term (current) drug therapy: Secondary | ICD-10-CM | POA: Diagnosis not present

## 2015-06-18 ENCOUNTER — Ambulatory Visit (INDEPENDENT_AMBULATORY_CARE_PROVIDER_SITE_OTHER): Payer: Medicare Other | Admitting: Endocrinology

## 2015-06-18 ENCOUNTER — Encounter: Payer: Self-pay | Admitting: Endocrinology

## 2015-06-18 VITALS — BP 132/84 | HR 96 | Temp 98.1°F | Wt 129.5 lb

## 2015-06-18 DIAGNOSIS — E119 Type 2 diabetes mellitus without complications: Secondary | ICD-10-CM | POA: Diagnosis not present

## 2015-06-18 LAB — POCT GLYCOSYLATED HEMOGLOBIN (HGB A1C): HEMOGLOBIN A1C: 6.4

## 2015-06-18 NOTE — Patient Instructions (Addendum)
Please come back for a follow-up appointment in 6 months.   Please continue the same medication for diabetes.  blood tests are requested for you today.  We'll let you know about the results.   Change the flonase to generic of zyrtec 10 mg daily.  It is cheapest at [facility] or [facility].

## 2015-06-18 NOTE — Progress Notes (Signed)
Subjective:    Patient ID: [name], male    DOB: 07-03-1955, 60 y.o.   MRN: [id number]  HPI  The state of at least three ongoing medical problems is addressed today, with interval history of each noted here: Pt returns for f/u of diabetes mellitus: DM type: 2, but he is presumed to be evolving type 1, given lean body habitus, MG, and neg FHx of DM.   Dx'ed: [id number] Complications: none Therapy: metformin DKA: never Severe hypoglycemia: never Pancreatitis: never Other: he has never been on insulin Interval history: Prednisone is now down to 20 mg qd.  He says cbg's have been normal Anxiety is much better: pt states he feels well in general, off the xanax.   Pt was noted to have hypercalcemia in 2016 (it was normal in 2015; PTH has been low; rest of w/u has been neg; he has never had urolithiasis). Allergic rhinitis: He does not like the flonase.  He has sneezing, itchy eyes, and rhinorrhea.   Past Medical History  Diagnosis Date  . 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 Insomnia, unspecified   . Myasthenia gravis without exacerbation ([place])   . Viral hepatitis B without mention of hepatic coma, chronic, without mention of hepatitis delta   . Other and unspecified hyperlipidemia   . Type II or unspecified type diabetes mellitus without mention of complication, not stated as uncontrolled   . Depressive disorder, not elsewhere classified   . Anxiety   . Internal hemorrhoids   . External hemorrhoid   . Colon polyp     Past Surgical History  Procedure Laterality Date  . Thymectomy      Social History   Social History  . Marital Status: Married    Spouse Name: N/A  . Number of Children: N/A  . Years of Education: N/A   Occupational History  . Not on file.   Social History Main Topics  . Smoking status: Never Smoker   . Smokeless tobacco: Not on file  . Alcohol Use: No  . Drug Use: No  . Sexual Activity: Not on file   Other Topics Concern  . Not  on file   Social History Narrative    Current Outpatient Prescriptions on File Prior to Visit  Medication Sig Dispense Refill  . calcium carbonate (OS-CAL) 600 MG TABS Take 600 mg by mouth 3 (three) times daily with meals.      . Cholecalciferol (VITAMIN D) 2000 UNITS tablet Take 2,000 Units by mouth daily.      . colestipol (MICRONIZED COLESTIPOL HCL) 1 g tablet TAKE 5 TABLETS BY MOUTH DAILY. 450 tablet 3  . desonide (DESONATE) 0.05 % gel Apply topically 2 (two) times daily.    Marland Kitchen entecavir (BARACLUDE) 1 MG tablet Take 1 tablet (1 mg total) by mouth daily. 30 tablet 6  . fluocinonide ointment (LIDEX) 0.05 % APPLY TO AFFECTED AREA EVERY DAY AS NEEDED. 30 g 1  . FLUoxetine (PROZAC) 20 MG capsule Take 1 capsule (20 mg total) by mouth daily. 90 capsule 2  . fluticasone (FLONASE) 50 MCG/ACT nasal spray Place 2 sprays into both nostrils daily. 48 g 0  . glucose blood (ONE TOUCH ULTRA TEST) test strip USE TO CHECK BLOOD SUGAR 1XDAY. DX CODE: E11.9 100 each 2  . Ibuprofen (ADVIL) 200 MG CAPS Take by mouth.    Marland Kitchen ketoconazole (NIZORAL) 2 % cream     . Lancets (ONETOUCH ULTRASOFT) lancets CHECK BLOOD SUGAR ONCE DAILY DX:E11.9  100 each 0  . metFORMIN (GLUCOPHAGE-XR) 500 MG 24 hr tablet Take 2 tablets (1,000 mg total) by mouth 2 (two) times daily. 360 tablet 1  . omeprazole (PRILOSEC) 20 MG capsule Take 1 capsule (20 mg total) by mouth daily. 30 capsule 5  . predniSONE (DELTASONE) 5 MG tablet TAKE 4 TABLETS (20 MG TOTAL) BY MOUTH EVERY OTHER DAY.  2  . pyridostigmine (MESTINON) 60 MG tablet Take 60 mg by mouth 3 (three) times daily.     . sitaGLIPtin (JANUVIA) 100 MG tablet Take 1 tablet (100 mg total) by mouth daily. 90 tablet 2  . zolpidem (AMBIEN) 10 MG tablet Take 1 tablet (10 mg total) by mouth at bedtime as needed. 30 tablet 5  . ampicillin (PRINCIPEN) 250 MG capsule Take 250 mg by mouth 4 (four) times daily. Reported on 06/18/2015     No current facility-administered medications on file prior to  visit.    Allergies  Allergen Reactions  . Azithromycin     REACTION: exac of his mg  . Beta Adrenergic Blockers     REACTION: exac of mg    No family history on file.  BP 132/84 mmHg  Pulse 96  Temp(Src) 98.1 F (36.7 C) (Oral)  Wt 129 lb 8 oz (58.741 kg)  SpO2 98%  Review of Systems No weight change. Denies nasal congestion.      Objective:   Physical Exam VITAL SIGNS:  See vs page GENERAL: no distress Pulses: dorsalis pedis intact bilat.   MSK: no deformity of the feet CV: no leg edema Skin:  no ulcer on the feet.  normal color and temp on the feet. Neuro: sensation is intact to touch on the feet    Lab Results  Component Value Date   HGBA1C 6.4 06/18/2015   24-HR urine ca++=200 mg/d Lab Results  Component Value Date   PTH 7* 02/19/2015   CALCIUM 11.1* 02/19/2015   CAION 1.11* 03/11/2010      Assessment & Plan:  DM: well-controlled.   Hypercalcemia: w/u neg.  Due for recheck.  Allergic rhinitis: persistent.  He wants an alternative to flonase.   Anxiety: still doing well off xanax.   Patient is advised the following: Patient Instructions  Please come back for a follow-up appointment in 6 months.   Please continue the same medication for diabetes.  blood tests are requested for you today.  We'll let you know about the results.   Change the flonase to generic of zyrtec 10 mg daily.  It is cheapest at [facility] or [facility].

## 2015-06-18 NOTE — Progress Notes (Signed)
Pre visit review using our clinic review tool, if applicable. No additional management support is needed unless otherwise documented below in the visit note. 

## 2015-06-21 LAB — PTH, INTACT AND CALCIUM
CALCIUM: 10.3 mg/dL (ref 8.4–10.5)
PTH: 11 pg/mL — AB (ref 14–64)

## 2015-07-28 ENCOUNTER — Encounter: Payer: Self-pay | Admitting: Endocrinology

## 2015-08-06 ENCOUNTER — Other Ambulatory Visit: Payer: Self-pay | Admitting: Endocrinology

## 2015-08-11 DIAGNOSIS — L219 Seborrheic dermatitis, unspecified: Secondary | ICD-10-CM | POA: Diagnosis not present

## 2015-08-11 DIAGNOSIS — L309 Dermatitis, unspecified: Secondary | ICD-10-CM | POA: Diagnosis not present

## 2015-08-11 DIAGNOSIS — L700 Acne vulgaris: Secondary | ICD-10-CM | POA: Diagnosis not present

## 2015-08-11 DIAGNOSIS — L80 Vitiligo: Secondary | ICD-10-CM | POA: Diagnosis not present

## 2015-08-17 DIAGNOSIS — G20 Parkinson's disease: Secondary | ICD-10-CM | POA: Diagnosis not present

## 2015-08-17 DIAGNOSIS — E119 Type 2 diabetes mellitus without complications: Secondary | ICD-10-CM | POA: Diagnosis not present

## 2015-08-17 LAB — HM DIABETES EYE EXAM

## 2015-09-14 ENCOUNTER — Other Ambulatory Visit: Payer: Self-pay | Admitting: Endocrinology

## 2015-09-14 MED ORDER — ZOLPIDEM TARTRATE 10 MG PO TABS: 10.0000 mg | ORAL_TABLET | Freq: Every evening | ORAL | 5 refills | Status: DC | PRN

## 2015-10-07 ENCOUNTER — Other Ambulatory Visit: Payer: Self-pay | Admitting: Endocrinology

## 2015-10-28 DIAGNOSIS — B181 Chronic viral hepatitis B without delta-agent: Secondary | ICD-10-CM | POA: Diagnosis not present

## 2015-12-02 ENCOUNTER — Other Ambulatory Visit: Payer: Self-pay | Admitting: Endocrinology

## 2015-12-15 ENCOUNTER — Other Ambulatory Visit: Payer: Medicare Other

## 2015-12-15 DIAGNOSIS — Z79899 Other long term (current) drug therapy: Secondary | ICD-10-CM | POA: Diagnosis not present

## 2015-12-15 DIAGNOSIS — G7000 Myasthenia gravis without (acute) exacerbation: Secondary | ICD-10-CM | POA: Diagnosis not present

## 2015-12-19 NOTE — Progress Notes (Deleted)
   Subjective:    Patient ID: [name], male    DOB: 29-Aug-1955, 60 y.o.   MRN: [id number]  HPI The state of at least three ongoing medical problems is addressed today, with interval history of each noted here: Pt returns for f/u of diabetes mellitus: DM type: 2, but he is presumed to be evolving type 1, given lean body habitus, MG, and neg FHx of DM.   Dx'ed: [id number] Complications: none Therapy: metformin DKA: never Severe hypoglycemia: never Pancreatitis: never Other: he has never been on insulin Interval history: Prednisone is now down to 20 mg qd.  He says cbg's have been normal Anxiety is much better: pt states he feels well in general, off the xanax.   Pt was noted to have hypercalcemia in 2016 (it was normal in 2015; PTH has been low; rest of w/u has been neg; he has never had urolithiasis). Allergic rhinitis: He does not like the flonase.  He has sneezing, itchy eyes, and rhinorrhea.     Review of Systems     Objective:   Physical Exam VITAL SIGNS:  See vs page GENERAL: no distress Pulses: dorsalis pedis intact bilat.   MSK: no deformity of the feet CV: no leg edema Skin:  no ulcer on the feet.  normal color and temp on the feet. Neuro: sensation is intact to touch on the feet       Assessment & Plan:  DM: well-controlled.   Hypercalcemia: w/u neg.  Due for recheck.  Allergic rhinitis: persistent.  He wants an alternative to flonase.   Anxiety: still doing well off xanax.

## 2015-12-20 ENCOUNTER — Ambulatory Visit: Payer: Medicare Other | Admitting: Endocrinology

## 2015-12-21 ENCOUNTER — Ambulatory Visit
Admission: RE | Admit: 2015-12-21 | Discharge: 2015-12-21 | Disposition: A | Discharge status: Home / Self Care | Payer: Medicare Other | Source: Ambulatory Visit | Attending: Endocrinology | Admitting: Endocrinology

## 2015-12-21 ENCOUNTER — Encounter: Payer: Self-pay | Admitting: Endocrinology

## 2015-12-21 ENCOUNTER — Ambulatory Visit (INDEPENDENT_AMBULATORY_CARE_PROVIDER_SITE_OTHER): Payer: Medicare Other | Admitting: Endocrinology

## 2015-12-21 VITALS — BP 126/82 | HR 107 | Ht 65.0 in | Wt 127.0 lb

## 2015-12-21 DIAGNOSIS — Z0000 Encounter for general adult medical examination without abnormal findings: Secondary | ICD-10-CM

## 2015-12-21 DIAGNOSIS — B181 Chronic viral hepatitis B without delta-agent: Secondary | ICD-10-CM

## 2015-12-21 DIAGNOSIS — M810 Age-related osteoporosis without current pathological fracture: Secondary | ICD-10-CM | POA: Diagnosis not present

## 2015-12-21 DIAGNOSIS — E559 Vitamin D deficiency, unspecified: Secondary | ICD-10-CM

## 2015-12-21 DIAGNOSIS — Z23 Encounter for immunization: Secondary | ICD-10-CM

## 2015-12-21 DIAGNOSIS — Z125 Encounter for screening for malignant neoplasm of prostate: Secondary | ICD-10-CM

## 2015-12-21 DIAGNOSIS — E119 Type 2 diabetes mellitus without complications: Secondary | ICD-10-CM | POA: Diagnosis not present

## 2015-12-21 LAB — LIPID PANEL
Cholesterol: 193 mg/dL (ref 0–200)
HDL: 46.5 mg/dL (ref >39.00)
NONHDL: 146.69
TRIGLYCERIDES: 324 mg/dL — AB (ref 0.0–149.0)
Total CHOL/HDL Ratio: 4
VLDL: 64.8 mg/dL — ABNORMAL HIGH (ref 0.0–40.0)

## 2015-12-21 LAB — MICROALBUMIN / CREATININE URINE RATIO
Creatinine,U: 38.7 mg/dL
MICROALB UR: 1 mg/dL (ref 0.0–1.9)
MICROALB/CREAT RATIO: 2.6 mg/g (ref 0.0–30.0)

## 2015-12-21 LAB — CBC WITH DIFFERENTIAL/PLATELET
BASOS ABS: 0 10*3/uL (ref 0.0–0.1)
Basophils Relative: 0.2 % (ref 0.0–3.0)
EOS ABS: 0 10*3/uL (ref 0.0–0.7)
Eosinophils Relative: 0.6 % (ref 0.0–5.0)
HEMATOCRIT: 46 % (ref 39.0–52.0)
HEMOGLOBIN: 15.8 g/dL (ref 13.0–17.0)
LYMPHS PCT: 15 % (ref 12.0–46.0)
Lymphs Abs: 1.2 10*3/uL (ref 0.7–4.0)
MCHC: 34.2 g/dL (ref 30.0–36.0)
MCV: 86.4 fl (ref 78.0–100.0)
MONO ABS: 0.2 10*3/uL (ref 0.1–1.0)
Monocytes Relative: 2.1 % — ABNORMAL LOW (ref 3.0–12.0)
Neutro Abs: 6.3 10*3/uL (ref 1.4–7.7)
Neutrophils Relative %: 82.1 % — ABNORMAL HIGH (ref 43.0–77.0)
Platelets: 239 10*3/uL (ref 150.0–400.0)
RBC: 5.32 Mil/uL (ref 4.22–5.81)
RDW: 13 % (ref 11.5–15.5)
WBC: 7.7 10*3/uL (ref 4.0–10.5)

## 2015-12-21 LAB — PSA, MEDICARE: PSA: 0.81 ng/mL (ref 0.10–4.00)

## 2015-12-21 LAB — HEPATIC FUNCTION PANEL
ALBUMIN: 4.8 g/dL (ref 3.5–5.2)
ALT: 23 U/L (ref 0–53)
AST: 22 U/L (ref 0–37)
Alkaline Phosphatase: 57 U/L (ref 39–117)
Bilirubin, Direct: 0.1 mg/dL (ref 0.0–0.3)
TOTAL PROTEIN: 7.2 g/dL (ref 6.0–8.3)
Total Bilirubin: 0.5 mg/dL (ref 0.2–1.2)

## 2015-12-21 LAB — VITAMIN D 25 HYDROXY (VIT D DEFICIENCY, FRACTURES): VITD: 65.18 ng/mL (ref 30.00–100.00)

## 2015-12-21 LAB — BASIC METABOLIC PANEL
BUN: 15 mg/dL (ref 6–23)
CALCIUM: 9.9 mg/dL (ref 8.4–10.5)
CO2: 23 mEq/L (ref 19–32)
CREATININE: 1.02 mg/dL (ref 0.40–1.50)
Chloride: 102 mEq/L (ref 96–112)
GFR: 79.34 mL/min (ref >60.00)
Glucose, Bld: 217 mg/dL — ABNORMAL HIGH (ref 70–99)
Potassium: 4.2 mEq/L (ref 3.5–5.1)
Sodium: 139 mEq/L (ref 135–145)

## 2015-12-21 LAB — URINALYSIS, ROUTINE W REFLEX MICROSCOPIC
BILIRUBIN URINE: NEGATIVE
LEUKOCYTES UA: NEGATIVE
NITRITE: NEGATIVE
PH: 6.5 (ref 5.0–8.0)
Specific Gravity, Urine: <=1.005 — AB (ref 1.000–1.030)
Total Protein, Urine: NEGATIVE
UROBILINOGEN UA: 0.2 (ref 0.0–1.0)
Urine Glucose: >=1000 — AB
WBC UA: NONE SEEN (ref 0–?)

## 2015-12-21 LAB — LDL CHOLESTEROL, DIRECT: LDL DIRECT: 104 mg/dL

## 2015-12-21 LAB — TSH: TSH: 0.34 u[IU]/mL — AB (ref 0.35–4.50)

## 2015-12-21 LAB — POCT GLYCOSYLATED HEMOGLOBIN (HGB A1C): Hemoglobin A1C: 6.3

## 2015-12-21 NOTE — Patient Instructions (Addendum)
blood tests are requested for you today.  We'll let you know about the results. Please consider these measures for your health:  minimize alcohol.  Do not use tobacco products.  Have a colonoscopy at least every 10 years from age 59.  Keep firearms safely stored.  Always use seat belts.  have working smoke alarms in your home.  See an eye doctor and dentist regularly.  Never drive under the influence of alcohol or drugs (including prescription drugs).   It is critically important to prevent falling down (keep floor areas well-lit, dry, and free of loose objects.  If you have a cane, walker, or wheelchair, you should use it, even for short trips around the house.  Wear flat-soled shoes.  Also, try not to rush) good diet and exercise significantly improve the control of your diabetes.  please let me know if you wish to be referred to a dietician.  high blood sugar is very risky to your health.  you should see an eye doctor and dentist every year.  It is very important to get all recommended vaccinations.  Please come back for a follow-up appointment in 6 months.

## 2015-12-21 NOTE — Progress Notes (Signed)
we discussed code status.  pt requests full code, but would not want to be started or maintained on artificial life-support measures if there was not a reasonable chance of recovery 

## 2015-12-21 NOTE — Progress Notes (Signed)
Subjective:    Patient ID: [name], male    DOB: 1955/10/19, 60 y.o.   MRN: [id number]  HPI Pt returns for f/u of diabetes mellitus: DM type: 2, but he is presumed to be evolving type 1, given lean body habitus, MG, and neg FHx of DM.   Dx'ed: [id number] Complications: none Therapy: 2 oral meds DKA: never Severe hypoglycemia: never Pancreatitis: never Other: he has never been on insulin Interval history: Prednisone is now down to 20 mg qd.  He says cbg's have been normal; prednisone has been reduced to 10 mg qd. Pt was noted to have hypercalcemia in 2016 (it was normal in 2015; PTH has been low; rest of w/u has been neg; he has never had urolithiasis; 24-HR urine ca++ was 200 mg).   Past Medical History:  Diagnosis Date  . Anxiety   . Colon polyp   . Depressive disorder, not elsewhere classified   . External hemorrhoid   . Insomnia, unspecified   . Internal hemorrhoids   . Myasthenia gravis without exacerbation ([place])   . Other and unspecified hyperlipidemia   . 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 Type II or unspecified type diabetes mellitus without mention of complication, not stated as uncontrolled   . Viral hepatitis B without mention of hepatic coma, chronic, without mention of hepatitis delta     Past Surgical History:  Procedure Laterality Date  . THYMECTOMY      Social History   Social History  . Marital status: Married    Spouse name: N/A  . Number of children: N/A  . Years of education: N/A   Occupational History  . Not on file.   Social History Main Topics  . Smoking status: Never Smoker  . Smokeless tobacco: Not on file  . Alcohol use No  . Drug use: No  . Sexual activity: Not on file   Other Topics Concern  . Not on file   Social History Narrative  . No narrative on file    Current Outpatient Prescriptions on File Prior to Visit  Medication Sig Dispense Refill  . ampicillin (PRINCIPEN) 250 MG capsule Take 250 mg by mouth  4 (four) times daily. Reported on 06/18/2015    . calcium carbonate (OS-CAL) 600 MG TABS Take 600 mg by mouth 3 (three) times daily with meals.      . Cholecalciferol (VITAMIN D) 2000 UNITS tablet Take 2,000 Units by mouth daily.      . colestipol (MICRONIZED COLESTIPOL HCL) 1 g tablet TAKE 5 TABLETS BY MOUTH DAILY. 450 tablet 3  . desonide (DESONATE) 0.05 % gel Apply topically 2 (two) times daily.    Marland Kitchen entecavir (BARACLUDE) 1 MG tablet Take 1 tablet (1 mg total) by mouth daily. 30 tablet 6  . fluocinonide ointment (LIDEX) 0.05 % APPLY TO AFFECTED AREA EVERY DAY AS NEEDED. 30 g 2  . FLUoxetine (PROZAC) 20 MG capsule Take 1 capsule (20 mg total) by mouth daily. 90 capsule 2  . fluticasone (FLONASE) 50 MCG/ACT nasal spray Place 2 sprays into both nostrils daily. 48 g 0  . glucose blood (ONE TOUCH ULTRA TEST) test strip USE TO CHECK BLOOD SUGAR 1XDAY. DX CODE: E11.9 100 each 2  . Ibuprofen (ADVIL) 200 MG CAPS Take by mouth.    Marland Kitchen ketoconazole (NIZORAL) 2 % cream     . Lancets (ONETOUCH ULTRASOFT) lancets CHECK BLOOD SUGAR ONCE DAILY DX:E11.9 100 each 0  . metFORMIN (GLUCOPHAGE-XR) 500 MG 24  hr tablet TAKE 2 TABLETS BY MOUTH TWO TIMES A DAY 360 tablet 0  . omeprazole (PRILOSEC) 20 MG capsule TAKE 1 CAPSULE BY MOUTH DAILY 30 capsule 4  . predniSONE (DELTASONE) 5 MG tablet TAKE 2 TABLETS (20 MG TOTAL) BY MOUTH EVERY OTHER DAY.  2  . pyridostigmine (MESTINON) 60 MG tablet Take 50 mg by mouth 3 (three) times daily.     Marland Kitchen zolpidem (AMBIEN) 10 MG tablet Take 1 tablet (10 mg total) by mouth at bedtime as needed. 30 tablet 5  . sitaGLIPtin (JANUVIA) 100 MG tablet Take 1 tablet (100 mg total) by mouth daily. (Patient not taking: Reported on 12/21/2015) 90 tablet 2   No current facility-administered medications on file prior to visit.     Allergies  Allergen Reactions  . Azithromycin     REACTION: exac of his mg  . Beta Adrenergic Blockers     REACTION: exac of mg   No family history on file.  BP  126/82   Pulse (!) 107   Ht 5\' 5"  (1.651 m)   Wt 127 lb (57.6 kg)   SpO2 95%   BMI 21.13 kg/m   Review of Systems Denies numbness    Objective:   Physical Exam VS: see vs page GEN: no distress.   NECK: supple, thyroid is not enlarged CHEST WALL: no deformity LUNGS: clear to auscultation BREASTS:  No gynecomastia CV: reg rate and rhythm, no murmur MUSCULOSKELETAL: muscle bulk and strength are grossly normal.  no obvious joint swelling.  gait is normal and steady EXTEMITIES: no deformity.  no ulcer on the feet.  feet are of normal color and temp.  no edema PULSES: dorsalis pedis intact bilat.  NEURO:  cn 2-12 grossly intact.   readily moves all 4's.  sensation is intact to touch on the feet SKIN:  Normal texture and temperature.  No rash or suspicious lesion is visible.   NODES:  None palpable at the neck.  PSYCH: alert, well-oriented.  Does not appear anxious nor depressed.   A1c=6.3%  Lab Results  Component Value Date   PTH 11 (L) 06/18/2015   CALCIUM 9.9 12/21/2015   CAION 1.11 (L) 03/11/2010      Assessment & Plan:  Type 2 DM: well-controlled.  Please continue the same medications Dyslipidemia: I advised medication.  Hypercalcemia: better now, but since cause was unknown, we should recheck in the future.     Subjective:   Patient here for Medicare annual wellness visit and management of other chronic and acute problems.     Risk factors: medical disability   Roster of Physicians Providing Medical Care to Patient:  See "snapshot"   Activities of Daily Living: In your present state of health, do you have any difficulty performing the following activities?:  Preparing food and eating?: No  Bathing yourself: No  Getting dressed: No  Using the toilet:No  Moving around from place to place: No  In the past year have you fallen or had a near fall?: No    Home Safety: Has smoke detector and wears seat belts. No firearms.   Diet and Exercise  Current exercise  habits: pt says good, as limited by MG Dietary issues discussed: pt reports a healthy diet   Depression Screen  Q1: Over the past two weeks, have you felt down, depressed or hopeless? He says depression is mild (says residual from [place] War in the 1970's). Q2: Over the past two weeks, have you felt little interest or pleasure  in doing things? no   The following portions of the patient's history were reviewed and updated as appropriate: allergies, current medications, past family history, past medical history, past social history, past surgical history and problem list.   Review of Systems  Denies hearing loss, and visual loss Objective:   Vision:  Advertising account executive, so he declines VA today Hearing: grossly normal Body mass index:  See vs page Msk: pt easily and quickly performs "get-up-and-go" from a sitting position Cognitive Impairment Assessment: cognition, memory and judgment appear normal.  remembers 3/3 at 5 minutes.  excellent recall.  can easily read and write a sentence.  alert and oriented x 3.     Assessment:   Medicare wellness utd on preventive parameters   Plan:   During the course of the visit the patient was educated and counseled about appropriate screening and preventive services including:       Fall prevention  Diabetes screening  Nutrition counseling   Vaccines / LABS Zostavax / Pneumococcal Vaccine  today  PSA  Patient Instructions (the written plan) was given to the patient.

## 2015-12-22 LAB — PTH, INTACT AND CALCIUM
Calcium: 9.9 mg/dL (ref 8.6–10.3)
PTH: 26 pg/mL (ref 14–64)

## 2015-12-30 ENCOUNTER — Telehealth: Payer: Self-pay | Admitting: Endocrinology

## 2015-12-30 NOTE — Telephone Encounter (Signed)
Patient is returning your call.  

## 2015-12-30 NOTE — Telephone Encounter (Signed)
Requested a call back from the patient to discuss further.  

## 2015-12-30 NOTE — Telephone Encounter (Signed)
I need to know why pt is calling.  About chol med? Does he want rx?  Would be ok with me.

## 2015-12-31 MED ORDER — ROSUVASTATIN CALCIUM 5 MG PO TABS: 5.0000 mg | ORAL_TABLET | Freq: Every day | ORAL | 3 refills | Status: DC

## 2015-12-31 NOTE — Telephone Encounter (Signed)
I contacted the patient and he stated he would like to start the cholesterol medication. Pateint would like the medication to be sent to [facility] [address].

## 2015-12-31 NOTE — Telephone Encounter (Signed)
I contacted the patient and advised we have submitted the prescription for Crestor. Patient voiced understanding and had no further question at this time.

## 2015-12-31 NOTE — Telephone Encounter (Signed)
Ok, I have sent a prescription to your pharmacy 

## 2016-01-02 ENCOUNTER — Other Ambulatory Visit: Payer: Self-pay | Admitting: Endocrinology

## 2016-01-04 ENCOUNTER — Ambulatory Visit (INDEPENDENT_AMBULATORY_CARE_PROVIDER_SITE_OTHER)
Admission: RE | Admit: 2016-01-04 | Discharge: 2016-01-04 | Disposition: A | Discharge status: Home / Self Care | Payer: Medicare Other | Source: Ambulatory Visit | Attending: Endocrinology | Admitting: Endocrinology

## 2016-01-04 DIAGNOSIS — M810 Age-related osteoporosis without current pathological fracture: Secondary | ICD-10-CM

## 2016-01-20 ENCOUNTER — Other Ambulatory Visit: Payer: Self-pay | Admitting: Endocrinology

## 2016-02-23 ENCOUNTER — Telehealth: Payer: Self-pay | Admitting: Endocrinology

## 2016-02-23 NOTE — Telephone Encounter (Signed)
Attempted to reach the patient. Patient was not available and did not have a working voicemail. Will try again at a later time.  

## 2016-02-23 NOTE — Telephone Encounter (Signed)
Patient has question about rosuvastatin (CRESTOR) 5 MG tablet, he stated his disease is getting worse is getting worse. And he also think its making his allergies  Worse. Please advise

## 2016-02-23 NOTE — Telephone Encounter (Signed)
Ok to d/c crestor.

## 2016-02-23 NOTE — Telephone Encounter (Signed)
See message and please advise, Thanks!  

## 2016-02-25 NOTE — Telephone Encounter (Signed)
I contacted the patinet and advised of message. Patient voiced understanding and had no further questions.

## 2016-04-04 ENCOUNTER — Other Ambulatory Visit: Payer: Self-pay | Admitting: Endocrinology

## 2016-04-04 DIAGNOSIS — R932 Abnormal findings on diagnostic imaging of liver and biliary tract: Secondary | ICD-10-CM | POA: Diagnosis not present

## 2016-04-04 DIAGNOSIS — B181 Chronic viral hepatitis B without delta-agent: Secondary | ICD-10-CM | POA: Diagnosis not present

## 2016-05-01 ENCOUNTER — Other Ambulatory Visit: Payer: Self-pay | Admitting: Endocrinology

## 2016-05-04 ENCOUNTER — Other Ambulatory Visit: Payer: Self-pay | Admitting: Endocrinology

## 2016-05-05 ENCOUNTER — Telehealth: Payer: Self-pay | Admitting: Endocrinology

## 2016-05-05 ENCOUNTER — Other Ambulatory Visit: Payer: Self-pay

## 2016-05-05 MED ORDER — ONETOUCH ULTRASOFT LANCETS MISC: 0 refills | Status: DC

## 2016-05-05 NOTE — Telephone Encounter (Signed)
[name] called and needs a new EScript for the One Touch Ultra Soft Lancets 100 with no refills with a Dx code so they can file with Medicare.

## 2016-05-05 NOTE — Telephone Encounter (Signed)
Submitted

## 2016-05-22 DIAGNOSIS — B181 Chronic viral hepatitis B without delta-agent: Secondary | ICD-10-CM | POA: Diagnosis not present

## 2016-05-30 DIAGNOSIS — B181 Chronic viral hepatitis B without delta-agent: Secondary | ICD-10-CM | POA: Diagnosis not present

## 2016-06-19 ENCOUNTER — Ambulatory Visit (INDEPENDENT_AMBULATORY_CARE_PROVIDER_SITE_OTHER): Payer: Medicare Other | Admitting: Endocrinology

## 2016-06-19 ENCOUNTER — Encounter: Payer: Self-pay | Admitting: Endocrinology

## 2016-06-19 VITALS — BP 124/78 | HR 79 | Ht 65.0 in | Wt 127.0 lb

## 2016-06-19 DIAGNOSIS — E785 Hyperlipidemia, unspecified: Secondary | ICD-10-CM | POA: Diagnosis not present

## 2016-06-19 DIAGNOSIS — E119 Type 2 diabetes mellitus without complications: Secondary | ICD-10-CM

## 2016-06-19 DIAGNOSIS — G7000 Myasthenia gravis without (acute) exacerbation: Secondary | ICD-10-CM

## 2016-06-19 LAB — POCT GLYCOSYLATED HEMOGLOBIN (HGB A1C): HEMOGLOBIN A1C: 6.5

## 2016-06-19 MED ORDER — COLESTIPOL HCL 1 G PO TABS: ORAL_TABLET | ORAL | 3 refills | Status: DC

## 2016-06-19 NOTE — Progress Notes (Signed)
Subjective:    Patient ID: [name], male    DOB: [birth year], 61 y.o.   MRN: [id number]  HPI Pt returns for f/u of diabetes mellitus: DM type: 2, but he is presumed to be evolving type 1, given lean body habitus, MG, and neg FHx of DM.   Dx'ed: 7001 Complications: none Therapy: metformin DKA: never Severe hypoglycemia: never.  Pancreatitis: never.  Other: he has never been on insulin.  Interval history: He says cbg's have been well-controlled; he takes prednisone, 15 mg qod.  He does not take [place].   Pt was noted to have hypercalcemia in 2016 (it was normal in 2015; PTH has been low; rest of w/u has been neg; he has never had urolithiasis; 24-HR urine Ca++ was 200 mg).   Past Medical History:  Diagnosis Date  . Anxiety   . Colon polyp   . Depressive disorder, not elsewhere classified   . External hemorrhoid   . Insomnia, unspecified   . Internal hemorrhoids   . Myasthenia gravis without exacerbation ([place])   . Other and unspecified hyperlipidemia   . 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 Type II or unspecified type diabetes mellitus without mention of complication, not stated as uncontrolled   . Viral hepatitis B without mention of hepatic coma, chronic, without mention of hepatitis delta     Past Surgical History:  Procedure Laterality Date  . THYMECTOMY      Social History   Social History  . Marital status: Married    Spouse name: N/A  . Number of children: N/A  . Years of education: N/A   Occupational History  . Not on file.   Social History Main Topics  . Smoking status: Never Smoker  . Smokeless tobacco: Never Used  . Alcohol use No  . Drug use: No  . Sexual activity: Not on file   Other Topics Concern  . Not on file   Social History Narrative  . No narrative on file    Current Outpatient Prescriptions on File Prior to Visit  Medication Sig Dispense Refill  . ampicillin (PRINCIPEN) 250 MG capsule Take 250 mg by mouth 4  (four) times daily. Reported on 06/18/2015    . calcium carbonate (OS-CAL) 600 MG TABS Take 600 mg by mouth 3 (three) times daily with meals.      . Cholecalciferol (VITAMIN D) 2000 UNITS tablet Take 2,000 Units by mouth daily.      Marland Kitchen desonide (DESONATE) 0.05 % gel Apply topically 2 (two) times daily.    Marland Kitchen entecavir (BARACLUDE) 1 MG tablet Take 1 tablet (1 mg total) by mouth daily. 30 tablet 6  . fluocinonide ointment (LIDEX) 0.05 % APPLY TO AFFECTED AREA DAILY AS NEEDED 30 g 1  . FLUoxetine (PROZAC) 20 MG capsule TAKE 1 CAPSULE (20 MG TOTAL) BY MOUTH DAILY. 90 capsule 1  . Ibuprofen (ADVIL) 200 MG CAPS Take by mouth.    Marland Kitchen ketoconazole (NIZORAL) 2 % cream     . Lancets (ONETOUCH ULTRASOFT) lancets USE TO CHECK FOR BLOOD SUGAR DAILY 100 each 0  . metFORMIN (GLUCOPHAGE-XR) 500 MG 24 hr tablet TAKE TWO TABLETS BY MOUTH TWICE A DAY 360 tablet 0  . omeprazole (PRILOSEC) 20 MG capsule TAKE ONE CAPSULE BY MOUTH DAILY 30 capsule 3  . ONE TOUCH ULTRA TEST test strip USE TO CHECK BLOOD SUGAR 1XDAY. DX CODE: E11.9 100 each 1  . predniSONE (DELTASONE) 5 MG tablet TAKE 3 TABLETS  BY  MOUTH EVERY OTHER DAY.  2  . pyridostigmine (MESTINON) 60 MG tablet Take 60 mg by mouth 3 (three) times daily.     Marland Kitchen zolpidem (AMBIEN) 10 MG tablet Take 1 tablet (10 mg total) by mouth at bedtime as needed. 30 tablet 5   No current facility-administered medications on file prior to visit.     Allergies  Allergen Reactions  . Azithromycin     REACTION: exac of his mg  . Beta Adrenergic Blockers     REACTION: exac of mg    No family history on file.  BP 124/78   Pulse 79   Ht 5\' 5"  (1.651 m)   Wt 127 lb (57.6 kg)   SpO2 94%   BMI 21.13 kg/m   Review of Systems He denies hypoglycemia.     Objective:   Physical Exam VITAL SIGNS:  See vs page GENERAL: no distress Pulses: dorsalis pedis intact bilat.   MSK: no deformity of the feet CV: no leg edema Skin:  no ulcer on the feet.  normal color and temp on the  feet. Neuro: sensation is intact to touch on the feet  A1c=6.5%  Lab Results  Component Value Date   CHOL 193 12/21/2015   HDL 46.50 12/21/2015   LDLCALC 68 07/23/2008   LDLDIRECT 104.0 12/21/2015   TRIG 324.0 (H) 12/21/2015   CHOLHDL 4 12/21/2015      Assessment & Plan:  Type 2 DM: well-controlled.    MG: pt says crestor worsens this.   Dyslipidemia: I have sent a prescription to your pharmacy, for colestipol.   Patient Instructions  Please continue the same metformin, and: Resume taking the colestipol.  Please come back for a follow-up appointment in 4 months. Please see Vaughan Basta the same day, to learn how to take insulin, just in case.

## 2016-06-19 NOTE — Patient Instructions (Addendum)
Please continue the same metformin, and: Resume taking the colestipol.  Please come back for a follow-up appointment in 4 months. Please see Vaughan Basta the same day, to learn how to take insulin, just in case.

## 2016-06-30 ENCOUNTER — Other Ambulatory Visit: Payer: Self-pay | Admitting: Endocrinology

## 2016-08-09 DIAGNOSIS — L219 Seborrheic dermatitis, unspecified: Secondary | ICD-10-CM | POA: Diagnosis not present

## 2016-08-09 DIAGNOSIS — L309 Dermatitis, unspecified: Secondary | ICD-10-CM | POA: Diagnosis not present

## 2016-08-09 DIAGNOSIS — L700 Acne vulgaris: Secondary | ICD-10-CM | POA: Diagnosis not present

## 2016-08-22 DIAGNOSIS — H04123 Dry eye syndrome of bilateral lacrimal glands: Secondary | ICD-10-CM | POA: Diagnosis not present

## 2016-08-22 DIAGNOSIS — E119 Type 2 diabetes mellitus without complications: Secondary | ICD-10-CM | POA: Diagnosis not present

## 2016-08-22 LAB — HM DIABETES EYE EXAM

## 2016-08-23 ENCOUNTER — Encounter: Payer: Self-pay | Admitting: Endocrinology

## 2016-08-28 ENCOUNTER — Other Ambulatory Visit: Payer: Self-pay | Admitting: Endocrinology

## 2016-09-08 ENCOUNTER — Telehealth: Payer: Self-pay | Admitting: Endocrinology

## 2016-09-08 NOTE — Telephone Encounter (Signed)
Patient called in reference to needing extra Rx along with the Metformin. Patient stated his blood sugar has been running in the 240s for about 3 weeks. Patient stated Loanne Drilling has given him another Rx along with the Metformin before.     Please call patient with any questions. [facility]acy is  [facility] [address] [place] [address] [phone] (Phone) [phone] (Fax)

## 2016-09-11 MED ORDER — SITAGLIPTIN PHOSPHATE 100 MG PO TABS: 100.0000 mg | ORAL_TABLET | Freq: Every day | ORAL | 11 refills | Status: DC

## 2016-09-11 NOTE — Telephone Encounter (Signed)
I have sent a prescription to your pharmacy, to resume [place] F/u ov on 09/22/16.

## 2016-09-11 NOTE — Telephone Encounter (Signed)
Called and left Pt a detailed VM to call back to make appt. Also that his prescription was sent to his pharmacy to resume Januvia.

## 2016-09-14 ENCOUNTER — Telehealth: Payer: Self-pay | Admitting: Endocrinology

## 2016-09-14 MED ORDER — SITAGLIPTIN PHOSPHATE 100 MG PO TABS: 100.0000 mg | ORAL_TABLET | Freq: Every day | ORAL | 11 refills | Status: DC

## 2016-09-14 NOTE — Telephone Encounter (Signed)
Refill corrected and submitted.

## 2016-09-14 NOTE — Telephone Encounter (Signed)
sitaGLIPtin (JANUVIA) 100 MG tablet was sent to the wrong pharmacy. Please correct and send to  [facility] [address] [place] [address] [phone] (Phone) [phone] (Fax)   Patient thinks this is the medication

## 2016-09-19 ENCOUNTER — Telehealth: Payer: Self-pay | Admitting: Endocrinology

## 2016-09-19 NOTE — Telephone Encounter (Signed)
Please advise. Thank you

## 2016-09-19 NOTE — Telephone Encounter (Signed)
2:15 PM tomorrow

## 2016-09-19 NOTE — Telephone Encounter (Signed)
Called and patient stated he could come in tomorrow at 2:15 per Dr. Loanne Drilling.

## 2016-09-19 NOTE — Telephone Encounter (Signed)
Patient states that he has a dry throat, shoulder pain, chest discomfort and just generally doesn't feel well. He is concerned and wants to talk to someone.

## 2016-09-20 ENCOUNTER — Ambulatory Visit (INDEPENDENT_AMBULATORY_CARE_PROVIDER_SITE_OTHER): Payer: Medicare Other | Admitting: Endocrinology

## 2016-09-20 ENCOUNTER — Encounter: Payer: Self-pay | Admitting: Endocrinology

## 2016-09-20 VITALS — BP 112/68 | HR 102 | Wt 125.8 lb

## 2016-09-20 DIAGNOSIS — E119 Type 2 diabetes mellitus without complications: Secondary | ICD-10-CM | POA: Diagnosis not present

## 2016-09-20 LAB — POCT GLYCOSYLATED HEMOGLOBIN (HGB A1C): Hemoglobin A1C: 6.1

## 2016-09-20 NOTE — Patient Instructions (Signed)
Please continue the same medications Please come back for a follow-up appointment in 3 months (must be after 12/20/16).

## 2016-09-20 NOTE — Progress Notes (Signed)
Subjective:    Patient ID: [name], male    DOB: [birth year], 61 y.o.   MRN: [id number]  HPI Pt returns for f/u of diabetes mellitus: DM type: 2, but he is presumed to be evolving type 1, given lean body habitus, MG, and neg FHx of DM.   Dx'ed: 9179 Complications: none Therapy: metformin DKA: never Severe hypoglycemia: never.  Pancreatitis: never.  Other: he has never been on insulin.  Interval history: He says cbg's vary from 100-300, for the past few months.   Pt was noted to have hypercalcemia in 2016 (it was normal in 2015; PTH has been low; rest of w/u has been neg; he has never had urolithiasis; 24-HR urine Ca++ was 200 mg). Pt states few days of slight blurry vision from both eyes, and assoc muscle weakness. No change in chronic prednisone dosage.  Pt says neurologist told him current sxs are not related to MG.  He also saw opthal 4 weeks ago--normal exam.  Past Medical History:  Diagnosis Date  . Anxiety   . Colon polyp   . Depressive disorder, not elsewhere classified   . External hemorrhoid   . Insomnia, unspecified   . Internal hemorrhoids   . Myasthenia gravis without exacerbation ([place])   . Other and unspecified hyperlipidemia   . 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 Type II or unspecified type diabetes mellitus without mention of complication, not stated as uncontrolled   . Viral hepatitis B without mention of hepatic coma, chronic, without mention of hepatitis delta     Past Surgical History:  Procedure Laterality Date  . THYMECTOMY      Social History   Social History  . Marital status: Married    Spouse name: N/A  . Number of children: N/A  . Years of education: N/A   Occupational History  . Not on file.   Social History Main Topics  . Smoking status: Never Smoker  . Smokeless tobacco: Never Used  . Alcohol use No  . Drug use: No  . Sexual activity: Not on file   Other Topics Concern  . Not on file   Social History  Narrative  . No narrative on file    Current Outpatient Prescriptions on File Prior to Visit  Medication Sig Dispense Refill  . calcium carbonate (OS-CAL) 600 MG TABS Take 600 mg by mouth 3 (three) times daily with meals.      . Cholecalciferol (VITAMIN D) 2000 UNITS tablet Take 2,000 Units by mouth daily.      . colestipol (MICRONIZED COLESTIPOL HCL) 1 g tablet TAKE 5 TABLETS BY MOUTH DAILY. 450 tablet 3  . desonide (DESONATE) 0.05 % gel Apply topically 2 (two) times daily.    Marland Kitchen entecavir (BARACLUDE) 1 MG tablet Take 1 tablet (1 mg total) by mouth daily. 30 tablet 6  . fluocinonide ointment (LIDEX) 0.05 % APPLY TO AFFECTED AREA DAILY AS NEEDED 30 g 1  . FLUoxetine (PROZAC) 20 MG capsule TAKE 1 CAPSULE (20 MG TOTAL) BY MOUTH DAILY. 90 capsule 1  . Ibuprofen (ADVIL) 200 MG CAPS Take by mouth.    Marland Kitchen ketoconazole (NIZORAL) 2 % cream     . Lancets (ONETOUCH ULTRASOFT) lancets USE TO CHECK FOR BLOOD SUGAR DAILY 100 each 0  . metFORMIN (GLUCOPHAGE-XR) 500 MG 24 hr tablet TAKE TWO TABLETS BY MOUTH TWICE A DAY 360 tablet 0  . omeprazole (PRILOSEC) 20 MG capsule TAKE ONE CAPSULE BY MOUTH DAILY 30 capsule 2  .  ONE TOUCH ULTRA TEST test strip USE TO CHECK BLOOD SUGAR 1XDAY. DX CODE: E11.9 100 each 1  . predniSONE (DELTASONE) 5 MG tablet TAKE 3 TABLETS  BY MOUTH EVERY OTHER DAY.  2  . pyridostigmine (MESTINON) 60 MG tablet Take 60 mg by mouth 3 (three) times daily.     . sitaGLIPtin (JANUVIA) 100 MG tablet Take 1 tablet (100 mg total) by mouth daily. 30 tablet 11  . zolpidem (AMBIEN) 10 MG tablet Take 1 tablet (10 mg total) by mouth at bedtime as needed. 30 tablet 5   No current facility-administered medications on file prior to visit.     Allergies  Allergen Reactions  . Azithromycin     REACTION: exac of his mg  . Beta Adrenergic Blockers     REACTION: exac of mg    No family history on file.  BP 112/68   Pulse (!) 102   Wt 125 lb 12.8 oz (57.1 kg)   SpO2 98%   BMI 20.93 kg/m    Review of Systems Denies weight change, numbness, and edema.      Objective:   Physical Exam VITAL SIGNS:  See vs page GENERAL: no distress Pulses: foot pulses are intact bilaterally.   MSK: no deformity of the feet or ankles.  CV: no edema of the legs or ankles.  Skin:  no ulcer on the feet or ankles.  normal color and temp on the feet and ankles.  Neuro: sensation is intact to touch on the feet and ankles.     Lab Results  Component Value Date   HGBA1C 6.1 09/20/2016      Assessment & Plan:  Type 2 DM: well-controlled.  Visual sxs, new to me, uncertain etiology.  I told pt we'll follow for now.   Patient Instructions  Please continue the same medications Please come back for a follow-up appointment in 3 months (must be after 12/20/16).

## 2016-09-22 ENCOUNTER — Ambulatory Visit: Payer: Medicare Other | Admitting: Endocrinology

## 2016-09-25 ENCOUNTER — Other Ambulatory Visit: Payer: Self-pay | Admitting: Endocrinology

## 2016-09-28 ENCOUNTER — Other Ambulatory Visit: Payer: Self-pay | Admitting: Endocrinology

## 2016-10-04 ENCOUNTER — Other Ambulatory Visit: Payer: Self-pay | Admitting: Endocrinology

## 2016-10-10 ENCOUNTER — Encounter: Payer: Medicare Other | Attending: Endocrinology | Admitting: Nutrition

## 2016-10-10 DIAGNOSIS — E119 Type 2 diabetes mellitus without complications: Secondary | ICD-10-CM | POA: Diagnosis not present

## 2016-10-10 DIAGNOSIS — Z713 Dietary counseling and surveillance: Secondary | ICD-10-CM | POA: Insufficient documentation

## 2016-10-10 NOTE — Patient Instructions (Signed)
Test blood sugars before meals and/or bedtime.  Do occasional 2hr. After readings, maybe once every 2 days.   Call if readings remain over 200 before meals. Exercise as tolerated daily. Stop drinking sweetened drinks

## 2016-10-10 NOTE — Progress Notes (Signed)
We reviewed his eating pattern. 7;30 AM: Bfast 2 pieces of toast with butter, jelly and sugar, coffee with cream and sugar, fruit 10:30 AM: 1 1/2 to 2 cups of white rice, protein and veg. With water or regular soda (1 can-3-4X/wk) fruit 3:00PM: rice with some veg. And protein, water to drink 5-6PM: 1 Cup of rice, with veg. And sometimes protein Medications:  Metformin 1000mg . Bid,  januvia qd,  Prednisone 4mg . QOD  SBGM:  Pt. Did not bring meter.  Says blood sugars much better since starting januvia, but FBS: 156-170s, acL: 200-267,  AcS: 200-280.    He was instructed on taking insulin, both pen and syringe.  He reported good understanding of this. We also reviewed low blood sugars--symptoms and treatments.  He reported good understanding of this.  He was encourage to call with blood sugar readings in 2 weeks, and he was told that if he needs to go up on the Prednisone, to call us.  He agreed to do this.  Suggestions for meal changes given: 1.  Strop sweetened soda and switch to diet drinks.  He does not want to do this, but will reduce the amount of 1/2 of a can 2.  Add protein to breakfast meal and reduce toast to 1 piece.    I believe he can not limit carbs as needed to prevent the need for insulin.  Encouraged exercise daily, but due to his MG, he can not exercise for more than 20 min. Daily.

## 2016-10-18 ENCOUNTER — Ambulatory Visit: Payer: Medicare Other | Admitting: Endocrinology

## 2016-10-18 ENCOUNTER — Encounter: Payer: Medicare Other | Admitting: Nutrition

## 2016-10-20 ENCOUNTER — Ambulatory Visit: Payer: Medicare Other | Admitting: Endocrinology

## 2016-10-26 ENCOUNTER — Ambulatory Visit (INDEPENDENT_AMBULATORY_CARE_PROVIDER_SITE_OTHER): Payer: Medicare Other | Admitting: Endocrinology

## 2016-10-26 ENCOUNTER — Encounter: Payer: Self-pay | Admitting: Endocrinology

## 2016-10-26 DIAGNOSIS — L299 Pruritus, unspecified: Secondary | ICD-10-CM | POA: Diagnosis not present

## 2016-10-26 MED ORDER — FLUOCINONIDE 0.05 % EX OINT: TOPICAL_OINTMENT | CUTANEOUS | 1 refills | Status: DC

## 2016-10-26 MED ORDER — GLUCOSE BLOOD VI STRP: 1.0000 | ORAL_STRIP | Freq: Three times a day (TID) | 3 refills | Status: DC

## 2016-10-26 NOTE — Patient Instructions (Signed)
Try applying the fluocinolide ointment to the back of your head.  Because this is strong, you should minimize this if you can.   I'll see you next time.

## 2016-10-26 NOTE — Progress Notes (Signed)
Subjective:    Patient ID: [name], male    DOB: Sep 20, 1955, 61 y.o.   MRN: [id number]  HPI Pt states 2 mos of slight swelling at the back of the neck, and assoc itching Past Medical History:  Diagnosis Date  . Anxiety   . Colon polyp   . Depressive disorder, not elsewhere classified   . External hemorrhoid   . Insomnia, unspecified   . Internal hemorrhoids   . Myasthenia gravis without exacerbation ([place])   . Other and unspecified hyperlipidemia   . 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 Type II or unspecified type diabetes mellitus without mention of complication, not stated as uncontrolled   . Viral hepatitis B without mention of hepatic coma, chronic, without mention of hepatitis delta     Past Surgical History:  Procedure Laterality Date  . THYMECTOMY      Social History   Social History  . Marital status: Married    Spouse name: N/A  . Number of children: N/A  . Years of education: N/A   Occupational History  . Not on file.   Social History Main Topics  . Smoking status: Never Smoker  . Smokeless tobacco: Never Used  . Alcohol use No  . Drug use: No  . Sexual activity: Not on file   Other Topics Concern  . Not on file   Social History Narrative  . No narrative on file    Current Outpatient Prescriptions on File Prior to Visit  Medication Sig Dispense Refill  . calcium carbonate (OS-CAL) 600 MG TABS Take 600 mg by mouth 3 (three) times daily with meals.      . Cholecalciferol (VITAMIN D) 2000 UNITS tablet Take 2,000 Units by mouth daily.      . colestipol (MICRONIZED COLESTIPOL HCL) 1 g tablet TAKE 5 TABLETS BY MOUTH DAILY. 450 tablet 3  . desonide (DESONATE) 0.05 % gel Apply topically 2 (two) times daily.    Marland Kitchen entecavir (BARACLUDE) 1 MG tablet Take 1 tablet (1 mg total) by mouth daily. 30 tablet 6  . FLUoxetine (PROZAC) 20 MG capsule TAKE ONE CAPSULE BY MOUTH DAILY 90 capsule 0  . Ibuprofen (ADVIL) 200 MG CAPS Take by mouth.      Marland Kitchen ketoconazole (NIZORAL) 2 % cream     . Lancets (ONETOUCH ULTRASOFT) lancets USE TO CHECK FOR BLOOD SUGAR DAILY 100 each 0  . metFORMIN (GLUCOPHAGE-XR) 500 MG 24 hr tablet TAKE TWO TABLETS BY MOUTH TWICE A DAY 360 tablet 0  . omeprazole (PRILOSEC) 20 MG capsule TAKE ONE CAPSULE BY MOUTH DAILY 30 capsule 2  . predniSONE (DELTASONE) 5 MG tablet TAKE 3 TABLETS  BY MOUTH EVERY OTHER DAY.  2  . pyridostigmine (MESTINON) 60 MG tablet Take 60 mg by mouth 3 (three) times daily.     . sitaGLIPtin (JANUVIA) 100 MG tablet Take 1 tablet (100 mg total) by mouth daily. 30 tablet 11  . zolpidem (AMBIEN) 10 MG tablet Take 1 tablet (10 mg total) by mouth at bedtime as needed. 30 tablet 5   No current facility-administered medications on file prior to visit.     Allergies  Allergen Reactions  . Azithromycin     REACTION: exac of his mg  . Beta Adrenergic Blockers     REACTION: exac of mg    No family history on file.  BP 132/80   Pulse (!) 113   Wt 124 lb 9.6 oz (56.5 kg)   SpO2  98%   BMI 20.73 kg/m    Review of Systems Denies fever.  He also has ongoing itching of the face.      Objective:   Physical Exam VITAL SIGNS:  See vs page. GENERAL: no distress. Head: slight swelling of the bioccipital areas, but no rash is seen.       Assessment & Plan:  Neck itching and swelling, new, uncertain etiology.  ? early psoriasis.   Patient Instructions  Try applying the fluocinolide ointment to the back of your head.  Because this is strong, you should minimize this if you can.   I'll see you next time.

## 2016-10-28 DIAGNOSIS — L299 Pruritus, unspecified: Secondary | ICD-10-CM | POA: Insufficient documentation

## 2016-10-30 ENCOUNTER — Other Ambulatory Visit: Payer: Self-pay | Admitting: Endocrinology

## 2016-11-25 ENCOUNTER — Other Ambulatory Visit: Payer: Self-pay | Admitting: Endocrinology

## 2016-11-27 ENCOUNTER — Other Ambulatory Visit: Payer: Self-pay | Admitting: Endocrinology

## 2016-11-29 DIAGNOSIS — Z79899 Other long term (current) drug therapy: Secondary | ICD-10-CM | POA: Diagnosis not present

## 2016-11-29 DIAGNOSIS — G7000 Myasthenia gravis without (acute) exacerbation: Secondary | ICD-10-CM | POA: Diagnosis not present

## 2016-12-04 ENCOUNTER — Other Ambulatory Visit: Payer: Self-pay

## 2016-12-04 MED ORDER — ONETOUCH ULTRASOFT LANCETS MISC: 5 refills | Status: DC

## 2016-12-05 ENCOUNTER — Other Ambulatory Visit: Payer: Self-pay

## 2016-12-05 MED ORDER — ONETOUCH ULTRASOFT LANCETS MISC: 5 refills | Status: DC

## 2016-12-22 ENCOUNTER — Other Ambulatory Visit: Payer: Self-pay | Admitting: Endocrinology

## 2016-12-25 ENCOUNTER — Encounter: Payer: Self-pay | Admitting: Endocrinology

## 2016-12-25 ENCOUNTER — Ambulatory Visit (INDEPENDENT_AMBULATORY_CARE_PROVIDER_SITE_OTHER): Payer: Medicare Other | Admitting: Endocrinology

## 2016-12-25 VITALS — BP 110/58 | HR 92 | Wt 123.8 lb

## 2016-12-25 DIAGNOSIS — Z23 Encounter for immunization: Secondary | ICD-10-CM | POA: Diagnosis not present

## 2016-12-25 DIAGNOSIS — Z119 Encounter for screening for infectious and parasitic diseases, unspecified: Secondary | ICD-10-CM | POA: Diagnosis not present

## 2016-12-25 DIAGNOSIS — E785 Hyperlipidemia, unspecified: Secondary | ICD-10-CM

## 2016-12-25 DIAGNOSIS — Z0000 Encounter for general adult medical examination without abnormal findings: Secondary | ICD-10-CM | POA: Diagnosis not present

## 2016-12-25 DIAGNOSIS — E559 Vitamin D deficiency, unspecified: Secondary | ICD-10-CM

## 2016-12-25 DIAGNOSIS — E119 Type 2 diabetes mellitus without complications: Secondary | ICD-10-CM | POA: Diagnosis not present

## 2016-12-25 DIAGNOSIS — Z125 Encounter for screening for malignant neoplasm of prostate: Secondary | ICD-10-CM

## 2016-12-25 DIAGNOSIS — L409 Psoriasis, unspecified: Secondary | ICD-10-CM | POA: Insufficient documentation

## 2016-12-25 LAB — LIPID PANEL
CHOL/HDL RATIO: 4
CHOLESTEROL: 170 mg/dL (ref 0–200)
HDL: 48.4 mg/dL (ref >39.00)
NONHDL: 121.18
Triglycerides: 209 mg/dL — ABNORMAL HIGH (ref 0.0–149.0)
VLDL: 41.8 mg/dL — AB (ref 0.0–40.0)

## 2016-12-25 LAB — CBC WITH DIFFERENTIAL/PLATELET
Basophils Absolute: 0 10*3/uL (ref 0.0–0.1)
Basophils Relative: 0.3 % (ref 0.0–3.0)
EOS PCT: 1.8 % (ref 0.0–5.0)
Eosinophils Absolute: 0.2 10*3/uL (ref 0.0–0.7)
HCT: 48.4 % (ref 39.0–52.0)
Hemoglobin: 16.5 g/dL (ref 13.0–17.0)
LYMPHS ABS: 1.3 10*3/uL (ref 0.7–4.0)
Lymphocytes Relative: 15.8 % (ref 12.0–46.0)
MCHC: 34.1 g/dL (ref 30.0–36.0)
MCV: 88.2 fl (ref 78.0–100.0)
MONO ABS: 0.2 10*3/uL (ref 0.1–1.0)
Monocytes Relative: 2.2 % — ABNORMAL LOW (ref 3.0–12.0)
NEUTROS ABS: 6.7 10*3/uL (ref 1.4–7.7)
NEUTROS PCT: 79.9 % — AB (ref 43.0–77.0)
PLATELETS: 219 10*3/uL (ref 150.0–400.0)
RBC: 5.49 Mil/uL (ref 4.22–5.81)
RDW: 13.3 % (ref 11.5–15.5)
WBC: 8.4 10*3/uL (ref 4.0–10.5)

## 2016-12-25 LAB — BASIC METABOLIC PANEL
BUN: 15 mg/dL (ref 6–23)
CHLORIDE: 100 meq/L (ref 96–112)
CO2: 32 meq/L (ref 19–32)
Calcium: 10.2 mg/dL (ref 8.4–10.5)
Creatinine, Ser: 0.94 mg/dL (ref 0.40–1.50)
GFR: 86.88 mL/min (ref >60.00)
GLUCOSE: 124 mg/dL — AB (ref 70–99)
POTASSIUM: 4.7 meq/L (ref 3.5–5.1)
Sodium: 140 mEq/L (ref 135–145)

## 2016-12-25 LAB — HEPATIC FUNCTION PANEL
ALBUMIN: 4.8 g/dL (ref 3.5–5.2)
ALT: 29 U/L (ref 0–53)
AST: 24 U/L (ref 0–37)
Alkaline Phosphatase: 45 U/L (ref 39–117)
BILIRUBIN TOTAL: 0.8 mg/dL (ref 0.2–1.2)
Bilirubin, Direct: 0.1 mg/dL (ref 0.0–0.3)
Total Protein: 7.5 g/dL (ref 6.0–8.3)

## 2016-12-25 LAB — URINALYSIS, ROUTINE W REFLEX MICROSCOPIC
Bilirubin Urine: NEGATIVE
Hgb urine dipstick: NEGATIVE
Ketones, ur: NEGATIVE
Leukocytes, UA: NEGATIVE
Nitrite: NEGATIVE
PH: 8 (ref 5.0–8.0)
RBC / HPF: NONE SEEN (ref 0–?)
Specific Gravity, Urine: 1.01 (ref 1.000–1.030)
TOTAL PROTEIN, URINE-UPE24: NEGATIVE
URINE GLUCOSE: NEGATIVE
UROBILINOGEN UA: 0.2 (ref 0.0–1.0)
WBC, UA: NONE SEEN (ref 0–?)

## 2016-12-25 LAB — VITAMIN D 25 HYDROXY (VIT D DEFICIENCY, FRACTURES): VITD: 58.2 ng/mL (ref 30.00–100.00)

## 2016-12-25 LAB — LDL CHOLESTEROL, DIRECT: LDL DIRECT: 87 mg/dL

## 2016-12-25 LAB — PSA, MEDICARE: PSA: 0.74 ng/mL (ref 0.10–4.00)

## 2016-12-25 LAB — TSH: TSH: 0.72 u[IU]/mL (ref 0.35–4.50)

## 2016-12-25 LAB — MICROALBUMIN / CREATININE URINE RATIO
Creatinine,U: 25.5 mg/dL
Microalb Creat Ratio: 2.7 mg/g (ref 0.0–30.0)
Microalb, Ur: <0.7 mg/dL (ref 0.0–1.9)

## 2016-12-25 LAB — POCT GLYCOSYLATED HEMOGLOBIN (HGB A1C): HEMOGLOBIN A1C: 5.8

## 2016-12-25 NOTE — Patient Instructions (Addendum)
Please go back to see the skin specialist, as you might have psoriasis. Please continue the same medications for diabetes. blood tests are requested for you today.  We'll let you know about the results.  good diet and exercise significantly improve the control of your diabetes.  please let me know if you wish to be referred to a dietician.  high blood sugar is very risky to your health.  you should see an eye doctor and dentist every year.  It is very important to get all recommended vaccinations.  Please consider these measures for your health:  minimize alcohol.  Do not use tobacco products.  Have a colonoscopy at least every 10 years from age 55.  Keep firearms safely stored.  Always use seat belts.  have working smoke alarms in your home.  See an eye doctor and dentist regularly.  Never drive under the influence of alcohol or drugs (including prescription drugs).   Please come back for a follow-up appointment in 6 months.

## 2016-12-25 NOTE — Progress Notes (Signed)
we discussed code status.  pt requests full code, but would not want to be started or maintained on artificial life-support measures if there was not a reasonable chance of recovery 

## 2016-12-25 NOTE — Progress Notes (Signed)
Subjective:    Patient ID: [name], male    DOB: 07-Sep-1955, 61 y.o.   MRN: [id number]  HPI Pt returns for f/u of diabetes mellitus: DM type: 2, but he is presumed to be evolving type 1, given lean body habitus, MG, probable psoriasis, and neg FHx of DM.   Dx'ed: 6767 Complications: none Therapy: 3 oral meds DKA: never Severe hypoglycemia: never.  Pancreatitis: never.  Other: he has never been on insulin.  Interval history: He says cbg's vary from 100-300, for the past few months.   Itching persists.  It is worse at the lower back and back of neck.    Review of Systems Denies numbness.      Objective:   Physical Exam VITAL SIGNS:  See vs page GENERAL: no distress Pulses: foot pulses are intact bilaterally.   MSK: no deformity of the feet or ankles.  CV: no edema of the legs or ankles Skin:  no ulcer on the feet or ankles.  normal color and temp on the feet and ankles Neuro: sensation is intact to touch on the feet and ankles.    Lab Results  Component Value Date   HGBA1C 5.8 12/25/2016      Assessment & Plan:  Type 2 DM: well-controlled Vit-D deficiency: due for recheck. Dyslipidemia: due for recheck.  Subjective:   Patient here for Medicare annual wellness visit and management of other chronic and acute problems.     Risk factors: advanced age    5 of Physicians Providing Medical Care to Patient:  See "snapshot"   Activities of Daily Living: In your present state of health, do you have any difficulty performing the following activities (lives with wife and 2 children)?:  Preparing food and eating?: No  Bathing yourself: No  Getting dressed: No  Using the toilet: No  Moving around from place to place: No  In the past year have you fallen or had a near fall?: No   Home Safety: Has smoke detector and wears seat belts. No firearms.   Opioid Use: none.     Diet and Exercise  Current exercise habits: pt says good.   Dietary issues discussed: pt  reports a healthy diet   Depression Screen  Q1: Over the past two weeks, have you felt down, depressed or hopeless? no  Q2: Over the past two weeks, have you felt little interest or pleasure in doing things? no   The following portions of the patient's history were reviewed and updated as appropriate: allergies, current medications, past family history, past medical history, past social history, past surgical history and problem list.   Review of Systems  Denies hearing loss, and visual loss.  Objective:   Vision:  Advertising account executive, so he declines VA today.   Hearing: grossly normal Body mass index:  See vs page Msk: pt easily and quickly performs "get-up-and-go" from a sitting position Cognitive Impairment Assessment: cognition, memory and judgment appear normal.  remembers 3/3 at 5 minutes.  excellent recall.  can easily read and write a sentence.  alert and oriented x 3.    Assessment:   Medicare wellness utd on preventive parameters    Plan:   During the course of the visit the patient was educated and counseled about appropriate screening and preventive services including:        Fall prevention is advised today  Bone densitometry screening is UTD Diabetes screening is not needed, as he has DM Nutrition counseling is offered  Vaccines are updated as needed  Patient Instructions (the written plan) was given to the patient.

## 2016-12-29 ENCOUNTER — Other Ambulatory Visit: Payer: Self-pay | Admitting: Endocrinology

## 2016-12-29 MED ORDER — GLUCOSE BLOOD VI STRP: 1.0000 | ORAL_STRIP | Freq: Three times a day (TID) | 3 refills | Status: DC

## 2017-01-01 ENCOUNTER — Other Ambulatory Visit: Payer: Self-pay

## 2017-01-01 MED ORDER — GLUCOSE BLOOD VI STRP: ORAL_STRIP | 3 refills | Status: DC

## 2017-01-03 DIAGNOSIS — L309 Dermatitis, unspecified: Secondary | ICD-10-CM | POA: Diagnosis not present

## 2017-01-11 DIAGNOSIS — L298 Other pruritus: Secondary | ICD-10-CM | POA: Diagnosis not present

## 2017-01-15 DIAGNOSIS — L298 Other pruritus: Secondary | ICD-10-CM | POA: Diagnosis not present

## 2017-01-17 DIAGNOSIS — L298 Other pruritus: Secondary | ICD-10-CM | POA: Diagnosis not present

## 2017-01-19 DIAGNOSIS — L298 Other pruritus: Secondary | ICD-10-CM | POA: Diagnosis not present

## 2017-01-22 ENCOUNTER — Other Ambulatory Visit: Payer: Self-pay | Admitting: Endocrinology

## 2017-01-24 DIAGNOSIS — L298 Other pruritus: Secondary | ICD-10-CM | POA: Diagnosis not present

## 2017-01-26 ENCOUNTER — Encounter: Payer: Self-pay | Admitting: Gastroenterology

## 2017-01-26 DIAGNOSIS — L298 Other pruritus: Secondary | ICD-10-CM | POA: Diagnosis not present

## 2017-01-29 DIAGNOSIS — L298 Other pruritus: Secondary | ICD-10-CM | POA: Diagnosis not present

## 2017-01-31 DIAGNOSIS — L298 Other pruritus: Secondary | ICD-10-CM | POA: Diagnosis not present

## 2017-02-02 DIAGNOSIS — L298 Other pruritus: Secondary | ICD-10-CM | POA: Diagnosis not present

## 2017-02-05 DIAGNOSIS — L298 Other pruritus: Secondary | ICD-10-CM | POA: Diagnosis not present

## 2017-02-07 DIAGNOSIS — L298 Other pruritus: Secondary | ICD-10-CM | POA: Diagnosis not present

## 2017-02-09 DIAGNOSIS — L298 Other pruritus: Secondary | ICD-10-CM | POA: Diagnosis not present

## 2017-02-12 DIAGNOSIS — L298 Other pruritus: Secondary | ICD-10-CM | POA: Diagnosis not present

## 2017-02-14 DIAGNOSIS — L298 Other pruritus: Secondary | ICD-10-CM | POA: Diagnosis not present

## 2017-02-15 DIAGNOSIS — G7000 Myasthenia gravis without (acute) exacerbation: Secondary | ICD-10-CM | POA: Diagnosis not present

## 2017-02-15 DIAGNOSIS — Z79899 Other long term (current) drug therapy: Secondary | ICD-10-CM | POA: Diagnosis not present

## 2017-02-16 DIAGNOSIS — L298 Other pruritus: Secondary | ICD-10-CM | POA: Diagnosis not present

## 2017-02-17 ENCOUNTER — Other Ambulatory Visit: Payer: Self-pay | Admitting: Endocrinology

## 2017-02-19 DIAGNOSIS — L298 Other pruritus: Secondary | ICD-10-CM | POA: Diagnosis not present

## 2017-02-21 DIAGNOSIS — L298 Other pruritus: Secondary | ICD-10-CM | POA: Diagnosis not present

## 2017-02-23 DIAGNOSIS — L298 Other pruritus: Secondary | ICD-10-CM | POA: Diagnosis not present

## 2017-02-26 DIAGNOSIS — L298 Other pruritus: Secondary | ICD-10-CM | POA: Diagnosis not present

## 2017-02-28 DIAGNOSIS — L298 Other pruritus: Secondary | ICD-10-CM | POA: Diagnosis not present

## 2017-03-02 DIAGNOSIS — L298 Other pruritus: Secondary | ICD-10-CM | POA: Diagnosis not present

## 2017-03-05 DIAGNOSIS — L298 Other pruritus: Secondary | ICD-10-CM | POA: Diagnosis not present

## 2017-03-07 DIAGNOSIS — L298 Other pruritus: Secondary | ICD-10-CM | POA: Diagnosis not present

## 2017-03-09 DIAGNOSIS — L298 Other pruritus: Secondary | ICD-10-CM | POA: Diagnosis not present

## 2017-03-12 DIAGNOSIS — L298 Other pruritus: Secondary | ICD-10-CM | POA: Diagnosis not present

## 2017-03-14 DIAGNOSIS — L298 Other pruritus: Secondary | ICD-10-CM | POA: Diagnosis not present

## 2017-03-16 DIAGNOSIS — L298 Other pruritus: Secondary | ICD-10-CM | POA: Diagnosis not present

## 2017-03-19 ENCOUNTER — Other Ambulatory Visit: Payer: Self-pay | Admitting: Endocrinology

## 2017-03-19 DIAGNOSIS — L298 Other pruritus: Secondary | ICD-10-CM | POA: Diagnosis not present

## 2017-03-21 ENCOUNTER — Other Ambulatory Visit: Payer: Self-pay | Admitting: Endocrinology

## 2017-03-21 DIAGNOSIS — L298 Other pruritus: Secondary | ICD-10-CM | POA: Diagnosis not present

## 2017-03-23 DIAGNOSIS — L298 Other pruritus: Secondary | ICD-10-CM | POA: Diagnosis not present

## 2017-03-26 DIAGNOSIS — L298 Other pruritus: Secondary | ICD-10-CM | POA: Diagnosis not present

## 2017-03-28 DIAGNOSIS — L298 Other pruritus: Secondary | ICD-10-CM | POA: Diagnosis not present

## 2017-04-04 DIAGNOSIS — L298 Other pruritus: Secondary | ICD-10-CM | POA: Diagnosis not present

## 2017-04-06 DIAGNOSIS — L298 Other pruritus: Secondary | ICD-10-CM | POA: Diagnosis not present

## 2017-04-11 DIAGNOSIS — L298 Other pruritus: Secondary | ICD-10-CM | POA: Diagnosis not present

## 2017-04-13 DIAGNOSIS — L298 Other pruritus: Secondary | ICD-10-CM | POA: Diagnosis not present

## 2017-04-14 ENCOUNTER — Other Ambulatory Visit: Payer: Self-pay | Admitting: Endocrinology

## 2017-04-18 DIAGNOSIS — L298 Other pruritus: Secondary | ICD-10-CM | POA: Diagnosis not present

## 2017-04-20 DIAGNOSIS — L298 Other pruritus: Secondary | ICD-10-CM | POA: Diagnosis not present

## 2017-04-27 DIAGNOSIS — L298 Other pruritus: Secondary | ICD-10-CM | POA: Diagnosis not present

## 2017-05-02 DIAGNOSIS — L298 Other pruritus: Secondary | ICD-10-CM | POA: Diagnosis not present

## 2017-05-11 ENCOUNTER — Other Ambulatory Visit: Payer: Self-pay | Admitting: Endocrinology

## 2017-06-10 ENCOUNTER — Other Ambulatory Visit: Payer: Self-pay | Admitting: Endocrinology

## 2017-06-12 DIAGNOSIS — Z1159 Encounter for screening for other viral diseases: Secondary | ICD-10-CM | POA: Diagnosis not present

## 2017-06-12 DIAGNOSIS — E119 Type 2 diabetes mellitus without complications: Secondary | ICD-10-CM | POA: Diagnosis not present

## 2017-06-12 DIAGNOSIS — B181 Chronic viral hepatitis B without delta-agent: Secondary | ICD-10-CM | POA: Diagnosis not present

## 2017-06-18 ENCOUNTER — Other Ambulatory Visit: Payer: Self-pay | Admitting: Endocrinology

## 2017-06-19 DIAGNOSIS — B181 Chronic viral hepatitis B without delta-agent: Secondary | ICD-10-CM | POA: Diagnosis not present

## 2017-06-19 DIAGNOSIS — R932 Abnormal findings on diagnostic imaging of liver and biliary tract: Secondary | ICD-10-CM | POA: Diagnosis not present

## 2017-06-20 ENCOUNTER — Telehealth: Payer: Self-pay | Admitting: Endocrinology

## 2017-06-20 NOTE — Telephone Encounter (Signed)
error 

## 2017-06-26 ENCOUNTER — Ambulatory Visit (INDEPENDENT_AMBULATORY_CARE_PROVIDER_SITE_OTHER): Payer: Medicare Other | Admitting: Endocrinology

## 2017-06-26 ENCOUNTER — Encounter: Payer: Self-pay | Admitting: Endocrinology

## 2017-06-26 VITALS — BP 124/80 | HR 92 | Wt 124.0 lb

## 2017-06-26 DIAGNOSIS — B181 Chronic viral hepatitis B without delta-agent: Secondary | ICD-10-CM

## 2017-06-26 DIAGNOSIS — E119 Type 2 diabetes mellitus without complications: Secondary | ICD-10-CM

## 2017-06-26 LAB — POCT GLYCOSYLATED HEMOGLOBIN (HGB A1C): Hemoglobin A1C: 5.6 % (ref 4.0–5.6)

## 2017-06-26 NOTE — Patient Instructions (Signed)
Please continue the same medications.   Please come back for a follow-up appointment in 6 months.    

## 2017-06-26 NOTE — Progress Notes (Signed)
Subjective:    Patient ID: [name], male    DOB: [birth year], 62 y.o.   MRN: [id number]  HPI Pt returns for f/u of diabetes mellitus: DM type: 2, but he is presumed to be evolving type 1, given lean body habitus, MG, psoriasis, and neg FHx of DM.   Dx'ed: 7829 Complications: none Therapy: 3 oral meds DKA: never Severe hypoglycemia: never.  Pancreatitis: never.  Other: he has never been on insulin.  Interval history: He says cbg's are well-controlled.  pt states he feels well in general.   Past Medical History:  Diagnosis Date  . Anxiety   . Colon polyp   . Depressive disorder, not elsewhere classified   . External hemorrhoid   . Insomnia, unspecified   . Internal hemorrhoids   . Myasthenia gravis without exacerbation ([place])   . Other and unspecified hyperlipidemia   . 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 Type II or unspecified type diabetes mellitus without mention of complication, not stated as uncontrolled   . Viral hepatitis B without mention of hepatic coma, chronic, without mention of hepatitis delta     Past Surgical History:  Procedure Laterality Date  . THYMECTOMY      Social History   Socioeconomic History  . Marital status: Married    Spouse name: Not on file  . Number of children: Not on file  . Years of education: Not on file  . Highest education level: Not on file  Occupational History  . Not on file  Social Needs  . Financial resource strain: Not on file  . Food insecurity:    Worry: Not on file    Inability: Not on file  . Transportation needs:    Medical: Not on file    Non-medical: Not on file  Tobacco Use  . Smoking status: Never Smoker  . Smokeless tobacco: Never Used  Substance and Sexual Activity  . Alcohol use: No  . Drug use: No  . Sexual activity: Not on file  Lifestyle  . Physical activity:    Days per week: Not on file    Minutes per session: Not on file  . Stress: Not on file  Relationships  . Social  connections:    Talks on phone: Not on file    Gets together: Not on file    Attends religious service: Not on file    Active member of club or organization: Not on file    Attends meetings of clubs or organizations: Not on file    Relationship status: Not on file  . Intimate partner violence:    Fear of current or ex partner: Not on file    Emotionally abused: Not on file    Physically abused: Not on file    Forced sexual activity: Not on file  Other Topics Concern  . Not on file  Social History Narrative  . Not on file    Current Outpatient Medications on File Prior to Visit  Medication Sig Dispense Refill  . calcium carbonate (OS-CAL) 600 MG TABS Take 600 mg by mouth 3 (three) times daily with meals.      . Cholecalciferol (VITAMIN D) 2000 UNITS tablet Take 2,000 Units by mouth daily.      . colestipol (MICRONIZED COLESTIPOL HCL) 1 g tablet TAKE 5 TABLETS BY MOUTH DAILY. 450 tablet 3  . desonide (DESONATE) 0.05 % gel Apply topically 2 (two) times daily.    Marland Kitchen entecavir (BARACLUDE) 1 MG tablet  Take 1 tablet (1 mg total) by mouth daily. 30 tablet 6  . FLUoxetine (PROZAC) 20 MG capsule TAKE ONE CAPSULE BY MOUTH DAILY 90 capsule 0  . glucose blood (ONE TOUCH ULTRA TEST) test strip Use to test blood sugar 3 times daily Dx code- E11.9 100 each 3  . Ibuprofen (ADVIL) 200 MG CAPS Take by mouth.    Marland Kitchen ketoconazole (NIZORAL) 2 % cream     . Lancets (ONETOUCH ULTRASOFT) lancets Use as instructed to check sugar 3 times daily. Dx E11.9. 300 each 5  . metFORMIN (GLUCOPHAGE-XR) 500 MG 24 hr tablet TAKE TWO TABLETS BY MOUTH TWICE A DAY 360 tablet 0  . omeprazole (PRILOSEC) 20 MG capsule TAKE ONE CAPSULE BY MOUTH DAILY 30 capsule 0  . predniSONE (DELTASONE) 5 MG tablet TAKE 3 TABLETS  BY MOUTH EVERY OTHER DAY.  2  . pyridostigmine (MESTINON) 60 MG tablet Take 60 mg by mouth 3 (three) times daily.     . sitaGLIPtin (JANUVIA) 100 MG tablet Take 1 tablet (100 mg total) by mouth daily. 30 tablet 11    No current facility-administered medications on file prior to visit.     Allergies  Allergen Reactions  . Azithromycin     REACTION: exac of his mg  . Beta Adrenergic Blockers     REACTION: exac of mg    History reviewed. No pertinent family history.  BP 124/80   Pulse 92   Wt 124 lb (56.2 kg)   SpO2 96%   BMI 20.63 kg/m    Review of Systems Denies weight change    Objective:   Physical Exam VITAL SIGNS:  See vs page GENERAL: no distress NECK: There is no palpable thyroid enlargement.  No thyroid nodule is palpable.  No palpable lymphadenopathy at the anterior neck. LUNGS:  Clear to auscultation HEART:  Regular rate and rhythm without murmurs noted. Normal S1,S2.   Pulses: foot pulses are intact bilaterally.   MSK: no deformity of the feet or ankles.  CV: no edema of the legs or ankles Skin:  no ulcer on the feet or ankles.  normal color and temp on the feet and ankles Neuro: sensation is intact to touch on the feet and ankles.     Lab Results  Component Value Date   HGBA1C 5.6 06/26/2017       Assessment & Plan:  Type 2 DM: well-controlled Lean body habitus: he is presumed to be evolving type 1.    Patient Instructions  Please continue the same medications Please come back for a follow-up appointment in 6 months

## 2017-07-14 ENCOUNTER — Other Ambulatory Visit: Payer: Self-pay | Admitting: Endocrinology

## 2017-07-16 ENCOUNTER — Other Ambulatory Visit: Payer: Self-pay | Admitting: Endocrinology

## 2017-07-17 ENCOUNTER — Other Ambulatory Visit: Payer: Self-pay

## 2017-07-17 MED ORDER — FLUOXETINE HCL 20 MG PO CAPS: 20.0000 mg | ORAL_CAPSULE | Freq: Every day | ORAL | 0 refills | Status: DC

## 2017-07-31 ENCOUNTER — Other Ambulatory Visit: Payer: Self-pay | Admitting: Endocrinology

## 2017-08-04 ENCOUNTER — Other Ambulatory Visit: Payer: Self-pay | Admitting: Endocrinology

## 2017-08-06 NOTE — Telephone Encounter (Signed)
I accidentally printed this one. Can you refill in Dr.Ellison's absence?

## 2017-08-08 ENCOUNTER — Other Ambulatory Visit: Payer: Self-pay | Admitting: Endocrinology

## 2017-08-11 ENCOUNTER — Other Ambulatory Visit: Payer: Self-pay | Admitting: Endocrinology

## 2017-08-16 ENCOUNTER — Telehealth: Payer: Self-pay | Admitting: Endocrinology

## 2017-08-16 MED ORDER — ZOLPIDEM TARTRATE 10 MG PO TABS: 10.0000 mg | ORAL_TABLET | Freq: Every evening | ORAL | 4 refills | Status: DC | PRN

## 2017-08-16 NOTE — Addendum Note (Signed)
Addended by: Renato Shin on: 08/16/2017 12:36 PM   Modules accepted: Orders

## 2017-08-16 NOTE — Telephone Encounter (Signed)
zolpidem (AMBIEN) 10 MG tablet      Pharmacy would like prescription sent back over to them. They stated that they did not receive it due to technical problems.    Ammie Ferrier [address], [place] [address]

## 2017-08-16 NOTE — Telephone Encounter (Signed)
Ok, I have sent a prescription to your pharmacy 

## 2017-08-27 DIAGNOSIS — L219 Seborrheic dermatitis, unspecified: Secondary | ICD-10-CM | POA: Diagnosis not present

## 2017-08-27 DIAGNOSIS — L309 Dermatitis, unspecified: Secondary | ICD-10-CM | POA: Diagnosis not present

## 2017-08-27 DIAGNOSIS — L700 Acne vulgaris: Secondary | ICD-10-CM | POA: Diagnosis not present

## 2017-08-28 DIAGNOSIS — E119 Type 2 diabetes mellitus without complications: Secondary | ICD-10-CM | POA: Diagnosis not present

## 2017-08-28 LAB — HM DIABETES EYE EXAM

## 2017-08-29 ENCOUNTER — Telehealth: Payer: Self-pay | Admitting: Endocrinology

## 2017-08-29 NOTE — Telephone Encounter (Signed)
I have called and advised patient ER!

## 2017-08-29 NOTE — Telephone Encounter (Signed)
Please go to ER now.

## 2017-08-29 NOTE — Telephone Encounter (Signed)
Patient has called back - he is having difficulty breathing sometimes. When he opens his mouth to talk or when he moves he is having trouble breathing. Please call ph# [phone] to advise

## 2017-08-29 NOTE — Telephone Encounter (Signed)
Patient stated that he would like to come in today to see Dr [name] Due to having a constant head ache and no energy to do anything. I told patient I would send a message back because there are no openings for [name] today and see if there is anything the Doctor could Advise   Please advise

## 2017-08-29 NOTE — Telephone Encounter (Signed)
Should I advise ER now?!

## 2017-08-31 DIAGNOSIS — G7000 Myasthenia gravis without (acute) exacerbation: Secondary | ICD-10-CM | POA: Diagnosis not present

## 2017-09-05 ENCOUNTER — Other Ambulatory Visit: Payer: Self-pay | Admitting: Endocrinology

## 2017-09-06 ENCOUNTER — Other Ambulatory Visit: Payer: Self-pay | Admitting: Endocrinology

## 2017-09-11 DIAGNOSIS — G7000 Myasthenia gravis without (acute) exacerbation: Secondary | ICD-10-CM | POA: Diagnosis not present

## 2017-09-11 DIAGNOSIS — Z79899 Other long term (current) drug therapy: Secondary | ICD-10-CM | POA: Diagnosis not present

## 2017-09-12 ENCOUNTER — Other Ambulatory Visit: Payer: Self-pay | Admitting: Endocrinology

## 2017-09-17 ENCOUNTER — Emergency Department (HOSPITAL_COMMUNITY): Payer: Medicare Other

## 2017-09-17 ENCOUNTER — Telehealth: Payer: Self-pay | Admitting: Endocrinology

## 2017-09-17 ENCOUNTER — Ambulatory Visit (INDEPENDENT_AMBULATORY_CARE_PROVIDER_SITE_OTHER): Payer: Medicare Other | Admitting: Endocrinology

## 2017-09-17 ENCOUNTER — Inpatient Hospital Stay (HOSPITAL_COMMUNITY)
Admission: EM | Admit: 2017-09-17 | Discharge: 2017-09-22 | DRG: 057 | Disposition: A | Discharge status: Home / Self Care | Payer: Medicare Other | Attending: Family Medicine | Admitting: Family Medicine

## 2017-09-17 ENCOUNTER — Inpatient Hospital Stay (HOSPITAL_COMMUNITY): Payer: Medicare Other

## 2017-09-17 ENCOUNTER — Encounter: Payer: Self-pay | Admitting: Endocrinology

## 2017-09-17 ENCOUNTER — Other Ambulatory Visit: Payer: Self-pay

## 2017-09-17 VITALS — BP 140/98 | HR 107 | Temp 98.2°F | Ht 65.0 in | Wt 122.6 lb

## 2017-09-17 DIAGNOSIS — E1165 Type 2 diabetes mellitus with hyperglycemia: Secondary | ICD-10-CM | POA: Diagnosis present

## 2017-09-17 DIAGNOSIS — G7000 Myasthenia gravis without (acute) exacerbation: Secondary | ICD-10-CM | POA: Diagnosis present

## 2017-09-17 DIAGNOSIS — B181 Chronic viral hepatitis B without delta-agent: Secondary | ICD-10-CM | POA: Diagnosis present

## 2017-09-17 DIAGNOSIS — Z86010 Personal history of colonic polyps: Secondary | ICD-10-CM | POA: Diagnosis not present

## 2017-09-17 DIAGNOSIS — Z883 Allergy status to other anti-infective agents status: Secondary | ICD-10-CM

## 2017-09-17 DIAGNOSIS — E119 Type 2 diabetes mellitus without complications: Secondary | ICD-10-CM

## 2017-09-17 DIAGNOSIS — Z7984 Long term (current) use of oral hypoglycemic drugs: Secondary | ICD-10-CM

## 2017-09-17 DIAGNOSIS — Z888 Allergy status to other drugs, medicaments and biological substances status: Secondary | ICD-10-CM

## 2017-09-17 DIAGNOSIS — F329 Major depressive disorder, single episode, unspecified: Secondary | ICD-10-CM | POA: Diagnosis present

## 2017-09-17 DIAGNOSIS — E1169 Type 2 diabetes mellitus with other specified complication: Secondary | ICD-10-CM

## 2017-09-17 DIAGNOSIS — Z7952 Long term (current) use of systemic steroids: Secondary | ICD-10-CM

## 2017-09-17 DIAGNOSIS — G4700 Insomnia, unspecified: Secondary | ICD-10-CM | POA: Diagnosis present

## 2017-09-17 DIAGNOSIS — F4310 Post-traumatic stress disorder, unspecified: Secondary | ICD-10-CM | POA: Diagnosis present

## 2017-09-17 DIAGNOSIS — E7800 Pure hypercholesterolemia, unspecified: Secondary | ICD-10-CM | POA: Diagnosis present

## 2017-09-17 DIAGNOSIS — E785 Hyperlipidemia, unspecified: Secondary | ICD-10-CM | POA: Diagnosis present

## 2017-09-17 DIAGNOSIS — F419 Anxiety disorder, unspecified: Secondary | ICD-10-CM | POA: Diagnosis present

## 2017-09-17 DIAGNOSIS — R29818 Other symptoms and signs involving the nervous system: Secondary | ICD-10-CM | POA: Diagnosis not present

## 2017-09-17 DIAGNOSIS — T380X5A Adverse effect of glucocorticoids and synthetic analogues, initial encounter: Secondary | ICD-10-CM | POA: Diagnosis present

## 2017-09-17 DIAGNOSIS — G7001 Myasthenia gravis with (acute) exacerbation: Principal | ICD-10-CM | POA: Diagnosis present

## 2017-09-17 DIAGNOSIS — R51 Headache: Secondary | ICD-10-CM | POA: Diagnosis not present

## 2017-09-17 DIAGNOSIS — R1310 Dysphagia, unspecified: Secondary | ICD-10-CM | POA: Diagnosis present

## 2017-09-17 DIAGNOSIS — R519 Headache, unspecified: Secondary | ICD-10-CM

## 2017-09-17 DIAGNOSIS — R0602 Shortness of breath: Secondary | ICD-10-CM | POA: Diagnosis not present

## 2017-09-17 LAB — CBC
HCT: 51 % (ref 39.0–52.0)
HEMOGLOBIN: 17.2 g/dL — AB (ref 13.0–17.0)
MCH: 29.4 pg (ref 26.0–34.0)
MCHC: 33.7 g/dL (ref 30.0–36.0)
MCV: 87.2 fL (ref 78.0–100.0)
Platelets: 206 10*3/uL (ref 150–400)
RBC: 5.85 MIL/uL — AB (ref 4.22–5.81)
RDW: 12.7 % (ref 11.5–15.5)
WBC: 11.7 10*3/uL — ABNORMAL HIGH (ref 4.0–10.5)

## 2017-09-17 LAB — COMPREHENSIVE METABOLIC PANEL
ALBUMIN: 4.2 g/dL (ref 3.5–5.0)
ALT: 25 U/L (ref 0–44)
AST: 23 U/L (ref 15–41)
Alkaline Phosphatase: 41 U/L (ref 38–126)
Anion gap: 9 (ref 5–15)
BILIRUBIN TOTAL: 1 mg/dL (ref 0.3–1.2)
BUN: 9 mg/dL (ref 8–23)
CHLORIDE: 99 mmol/L (ref 98–111)
CO2: 30 mmol/L (ref 22–32)
Calcium: 9.6 mg/dL (ref 8.9–10.3)
Creatinine, Ser: 1.04 mg/dL (ref 0.61–1.24)
GFR calc Af Amer: >60 mL/min (ref >60)
GFR calc non Af Amer: >60 mL/min (ref >60)
GLUCOSE: 281 mg/dL — AB (ref 70–99)
POTASSIUM: 4.3 mmol/L (ref 3.5–5.1)
Sodium: 138 mmol/L (ref 135–145)
Total Protein: 7.1 g/dL (ref 6.5–8.1)

## 2017-09-17 LAB — CBC WITH DIFFERENTIAL/PLATELET
ABS IMMATURE GRANULOCYTES: 0 10*3/uL (ref 0.0–0.1)
Basophils Absolute: 0 10*3/uL (ref 0.0–0.1)
Basophils Relative: 0 %
EOS ABS: 0 10*3/uL (ref 0.0–0.7)
Eosinophils Relative: 0 %
HEMATOCRIT: 48 % (ref 39.0–52.0)
Hemoglobin: 16.2 g/dL (ref 13.0–17.0)
IMMATURE GRANULOCYTES: 0 %
LYMPHS ABS: 1.4 10*3/uL (ref 0.7–4.0)
Lymphocytes Relative: 14 %
MCH: 29.2 pg (ref 26.0–34.0)
MCHC: 33.8 g/dL (ref 30.0–36.0)
MCV: 86.5 fL (ref 78.0–100.0)
Monocytes Absolute: 0.1 10*3/uL (ref 0.1–1.0)
Monocytes Relative: 1 %
Neutro Abs: 8.3 10*3/uL — ABNORMAL HIGH (ref 1.7–7.7)
Neutrophils Relative %: 85 %
PLATELETS: 206 10*3/uL (ref 150–400)
RBC: 5.55 MIL/uL (ref 4.22–5.81)
RDW: 12.6 % (ref 11.5–15.5)
WBC: 9.9 10*3/uL (ref 4.0–10.5)

## 2017-09-17 LAB — HEMOGLOBIN A1C
Hgb A1c MFr Bld: 5.9 % — ABNORMAL HIGH (ref 4.8–5.6)
Mean Plasma Glucose: 122.63 mg/dL

## 2017-09-17 LAB — TSH: TSH: 0.878 u[IU]/mL (ref 0.350–4.500)

## 2017-09-17 LAB — GLUCOSE, CAPILLARY: GLUCOSE-CAPILLARY: 150 mg/dL — AB (ref 70–99)

## 2017-09-17 LAB — PROTIME-INR
INR: 0.92
PROTHROMBIN TIME: 12.3 s (ref 11.4–15.2)

## 2017-09-17 LAB — CBG MONITORING, ED: GLUCOSE-CAPILLARY: 112 mg/dL — AB (ref 70–99)

## 2017-09-17 LAB — BRAIN NATRIURETIC PEPTIDE: B NATRIURETIC PEPTIDE 5: 11.4 pg/mL (ref 0.0–100.0)

## 2017-09-17 LAB — CREATININE, SERUM
Creatinine, Ser: 1.02 mg/dL (ref 0.61–1.24)
GFR calc Af Amer: >60 mL/min (ref >60)
GFR calc non Af Amer: >60 mL/min (ref >60)

## 2017-09-17 LAB — I-STAT TROPONIN, ED: TROPONIN I, POC: 0 ng/mL (ref 0.00–0.08)

## 2017-09-17 MED ORDER — PREDNISONE 5 MG PO TABS: 15.0000 mg | ORAL_TABLET | Freq: Every day | ORAL | Status: DC

## 2017-09-17 MED ORDER — PREDNISONE 20 MG PO TABS: 40.0000 mg | ORAL_TABLET | Freq: Every day | ORAL | Status: DC

## 2017-09-17 MED ORDER — PREDNISONE 20 MG PO TABS: 20.0000 mg | ORAL_TABLET | Freq: Every day | ORAL | Status: DC

## 2017-09-17 MED ORDER — PIMECROLIMUS 1 % EX CREA: 1.0000 "application " | TOPICAL_CREAM | Freq: Two times a day (BID) | CUTANEOUS | Status: DC | PRN

## 2017-09-17 MED ORDER — CALCIUM CARBONATE 1250 (500 CA) MG PO TABS
1250.0000 mg | ORAL_TABLET | Freq: Three times a day (TID) | ORAL | Status: DC
  Administered 2017-09-18 – 2017-09-22 (×13): 1250 mg via ORAL
  Filled 2017-09-17 (×13): qty 1

## 2017-09-17 MED ORDER — FLUOCINONIDE 0.05 % EX OINT: 1.0000 "application " | TOPICAL_OINTMENT | Freq: Two times a day (BID) | CUTANEOUS | Status: DC | PRN

## 2017-09-17 MED ORDER — FLUOXETINE HCL 20 MG PO CAPS
20.0000 mg | ORAL_CAPSULE | Freq: Every day | ORAL | Status: DC
  Administered 2017-09-19: 20 mg via ORAL
  Filled 2017-09-17 (×2): qty 1

## 2017-09-17 MED ORDER — VITAMIN D 1000 UNITS PO TABS
2000.0000 [IU] | ORAL_TABLET | Freq: Every day | ORAL | Status: DC
  Administered 2017-09-18 – 2017-09-22 (×5): 2000 [IU] via ORAL
  Filled 2017-09-17 (×5): qty 2

## 2017-09-17 MED ORDER — LINAGLIPTIN 5 MG PO TABS
5.0000 mg | ORAL_TABLET | Freq: Every day | ORAL | Status: DC
  Filled 2017-09-17: qty 1

## 2017-09-17 MED ORDER — PREDNISONE 5 MG PO TABS
15.0000 mg | ORAL_TABLET | ORAL | Status: DC
  Administered 2017-09-18 – 2017-09-22 (×3): 15 mg via ORAL
  Filled 2017-09-17 (×5): qty 3

## 2017-09-17 MED ORDER — SODIUM CHLORIDE 0.9 % IV SOLN
INTRAVENOUS | Status: DC
  Administered 2017-09-17 – 2017-09-19 (×3): via INTRAVENOUS

## 2017-09-17 MED ORDER — KETOCONAZOLE 2 % EX CREA
1.0000 "application " | TOPICAL_CREAM | Freq: Two times a day (BID) | CUTANEOUS | Status: DC | PRN
  Filled 2017-09-17: qty 15

## 2017-09-17 MED ORDER — ENOXAPARIN SODIUM 40 MG/0.4ML ~~LOC~~ SOLN
40.0000 mg | Freq: Every day | SUBCUTANEOUS | Status: DC
  Administered 2017-09-17 – 2017-09-20 (×4): 40 mg via SUBCUTANEOUS
  Filled 2017-09-17 (×5): qty 0.4

## 2017-09-17 MED ORDER — PREDNISONE 20 MG PO TABS: 40.0000 mg | ORAL_TABLET | Freq: Every day | ORAL | 0 refills | Status: DC

## 2017-09-17 MED ORDER — TRIAMCINOLONE ACETONIDE 0.1 % EX CREA
1.0000 "application " | TOPICAL_CREAM | Freq: Two times a day (BID) | CUTANEOUS | Status: DC | PRN
  Filled 2017-09-17: qty 15

## 2017-09-17 MED ORDER — IMMUNE GLOBULIN (HUMAN) 10 GM/100ML IV SOLN
400.0000 mg/kg | INTRAVENOUS | Status: DC
  Administered 2017-09-19 – 2017-09-21 (×3): 20 g via INTRAVENOUS
  Filled 2017-09-17 (×5): qty 200

## 2017-09-17 MED ORDER — HYDROCORTISONE 1 % EX CREA
TOPICAL_CREAM | Freq: Four times a day (QID) | CUTANEOUS | Status: DC
  Administered 2017-09-19 (×3): via TOPICAL
  Filled 2017-09-17: qty 28

## 2017-09-17 MED ORDER — ENTECAVIR 1 MG PO TABS
1.0000 mg | ORAL_TABLET | Freq: Every day | ORAL | Status: DC
  Administered 2017-09-19 – 2017-09-22 (×4): 1 mg via ORAL
  Filled 2017-09-17 (×5): qty 1

## 2017-09-17 MED ORDER — ZOLPIDEM TARTRATE 5 MG PO TABS: 10.0000 mg | ORAL_TABLET | Freq: Every evening | ORAL | Status: DC | PRN

## 2017-09-17 MED ORDER — INSULIN ASPART 100 UNIT/ML ~~LOC~~ SOLN
0.0000 [IU] | Freq: Three times a day (TID) | SUBCUTANEOUS | Status: DC
  Administered 2017-09-18: 2 [IU] via SUBCUTANEOUS
  Administered 2017-09-20 – 2017-09-21 (×2): 1 [IU] via SUBCUTANEOUS
  Administered 2017-09-22: 2 [IU] via SUBCUTANEOUS

## 2017-09-17 MED ORDER — PANTOPRAZOLE SODIUM 40 MG PO TBEC
40.0000 mg | DELAYED_RELEASE_TABLET | Freq: Every day | ORAL | Status: DC
  Administered 2017-09-18 – 2017-09-22 (×5): 40 mg via ORAL
  Filled 2017-09-17 (×5): qty 1

## 2017-09-17 MED ORDER — COLESTIPOL HCL 1 G PO TABS
5.0000 g | ORAL_TABLET | Freq: Every day | ORAL | Status: DC
  Administered 2017-09-18 – 2017-09-22 (×5): 5 g via ORAL
  Filled 2017-09-17 (×5): qty 5

## 2017-09-17 MED ORDER — PYRIDOSTIGMINE BROMIDE 60 MG PO TABS
60.0000 mg | ORAL_TABLET | Freq: Three times a day (TID) | ORAL | Status: DC
  Administered 2017-09-17 – 2017-09-22 (×15): 60 mg via ORAL
  Filled 2017-09-17 (×16): qty 1

## 2017-09-17 NOTE — ED Triage Notes (Addendum)
1 month of worsening SOB; reports chest tightening. Denies swelling. Denies smoking hx. Reports swallowing and chewing problems worsening. Hx myasthenia gravis. Reports intermittent numbness on chin and heel

## 2017-09-17 NOTE — ED Provider Notes (Signed)
[facility] EMERGENCY DEPARTMENT Provider Note   CSN: [id number] Arrival date & time: 09/17/17  1056     History   Chief Complaint Chief Complaint  Patient presents with  . Shortness of Breath    HPI [name] is a 62 y.o. male [facility] presents for evaluation of multiple complaints today.  He reports that over the last month, he has a progressively worsening shortness of breath.  He states that he notices the shortness of breath when he is talking.  He reports that about after 4 to 5 minutes of talking he feels like his voice changes and he feels like he has trouble breathing and must work to catch his breath.  Additionally, he reports some dyspnea on exertion.  He states that if he sits still at rest, he does not have difficulty breathing.  He has reported some chest "tension" is been intermittently occurring over the last month.  He says that it is not brought on by exertion.  He denies any associated diaphoresis or nausea.  Additionally, patient reports some intermittent numbness to the lower right side of his face.  He states that he particularly notices of when he is eating.  He feels like he has time chewing.  He states that he will often have to stop because he cannot feel he can move his mouth.  He reports he has had some numbness to the right cheek.  He reports that since January, he has had some right eye drooping.  He states this is worsened over the last month.  He saw his neurologist (Dr. Burnett Harry -[facility]) on 09/01/2017 who bumped his prednisone from 50 mg q. every day to 30 mg daily.  He reports that he saw her again on the 13th and she decrease his dose to 20 mg of prednisone daily.  He did report some improvement with the increased prednisone dosage.  He comes in today because he feels like his symptoms are worsening.  He states he has had some trouble seeing secondary to the right eye drooping.  He also reports he is having some intermittent right  upper extremity weakness.  He states that when he brushes his teeth or shampoos his hair, he notices that he has to move his right arm.  Reports that in the mornings, he feels that he has some bilateral lower extremity weakness but states that after he gets moving, and improves.  He denies any vomiting, abdominal pain.  The history is provided by the patient.    Past Medical History:  Diagnosis Date  . Anxiety   . Colon polyp   . Depressive disorder, not elsewhere classified   . External hemorrhoid   . Insomnia, unspecified   . Internal hemorrhoids   . Myasthenia gravis without exacerbation ([place])   . Other and unspecified hyperlipidemia   . 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 Type II or unspecified type diabetes mellitus without mention of complication, not stated as uncontrolled   . Viral hepatitis B without mention of hepatic coma, chronic, without mention of hepatitis delta     Patient Active Problem List   Diagnosis Date Noted  . Myasthenia gravis with acute exacerbation ([place]) 09/17/2017  . Psoriasis 12/25/2016  . Dyslipidemia 12/25/2016  . Pruritus 10/28/2016  . Hypercalcemia 03/30/2014  . Allergic rhinitis, cause unspecified 01/02/2013  . Photosensitivity 10/30/2011  . Encounter for long-term (current) use of other medications 09/25/2011  . Screening examination for infectious disease 09/22/2010  .  Screening for prostate cancer 08/24/2010  . PHLEBITIS AND THROMBOPHLEBITIS OF OTHER SITE 02/17/2010  . Disorder of thyroid 10/05/2009  . HEARING LOSS 09/06/2009  . SHOULDER PAIN, RIGHT 07/07/2009  . URI 01/28/2009  . Vitamin D deficiency 11/23/2008  . Headache 11/23/2008  . GERD 07/23/2008  . SEBORRHEA 05/12/2008  . HYPERCHOLESTEROLEMIA 02/24/2008  . OTITIS MEDIA, SUPPURATIVE, ACUTE, BILATERAL 11/25/2007  . Osteoporosis 11/25/2007  . SINUS TACHYCARDIA 03/25/2007  . EXANTHEM 03/25/2007  . COLONIC POLYPS 03/14/2007  . ANXIETY STATE, UNSPECIFIED  03/14/2007  . INTERNAL HEMORRHOIDS 03/14/2007  . EXTERNAL HEMORRHOIDS 03/14/2007  . Chronic hepatitis B virus infection ([place]) 06/29/2006  . Diabetes ([place]) 06/29/2006  . DYSLIPIDEMIA 06/29/2006  . RED CELL APLASIA, ACQUIRED, ADULT, WITH THYMO 06/29/2006  . DEPRESSION 06/29/2006  . MYASTHENIA GRAVIS W/O (ACUTE) EXACERBATION 06/29/2006  . INSOMNIA 06/29/2006    Past Surgical History:  Procedure Laterality Date  . THYMECTOMY          Home Medications    Prior to Admission medications   Medication Sig Start Date End Date Taking? Authorizing Provider  calcium carbonate (OS-CAL) 600 MG TABS Take 600 mg by mouth 3 (three) times daily with meals.     Yes [provider]  Cholecalciferol (VITAMIN D) 2000 UNITS tablet Take 2,000 Units by mouth daily.     Yes [provider]  colestipol (COLESTID) 1 g tablet TAKE FIVE TABLETS BY MOUTH DAILY Patient taking differently: Take 5 g by mouth daily.  09/06/17  Yes Renato Shin, MD  desonide (DESONATE) 0.05 % gel Apply 1 application topically 2 (two) times daily as needed.    Yes [provider]  entecavir (BARACLUDE) 1 MG tablet Take 1 tablet (1 mg total) by mouth daily. 07/13/11  Yes Marty Heck, MD  fluocinonide ointment (LIDEX) 9.21 % Apply 1 application topically 2 (two) times daily as needed.  08/28/17  Yes [provider]  FLUoxetine (PROZAC) 20 MG capsule Take 1 capsule (20 mg total) by mouth daily. 07/17/17  Yes Renato Shin, MD  ketoconazole (NIZORAL) 2 % cream Apply 1 application topically 2 (two) times daily as needed for irritation.  10/19/11  Yes [provider]  metFORMIN (GLUCOPHAGE-XR) 500 MG 24 hr tablet TAKE TWO TABLETS BY MOUTH TWICE A DAY Patient taking differently: Take 1,000 mg by mouth 2 (two) times daily.  09/13/17  Yes Renato Shin, MD  omeprazole (PRILOSEC) 20 MG capsule TAKE ONE CAPSULE BY MOUTH DAILY Patient taking differently: Take 20 mg by mouth daily.  09/06/17  Yes Renato Shin, MD  pimecrolimus (ELIDEL) 1 % cream Apply 1 application topically 2 (two) times daily as needed.  01/01/13  Yes [provider]  predniSONE (DELTASONE) 10 MG tablet Take 20 mg by mouth daily with breakfast.   Yes [provider]  pyridostigmine (MESTINON) 60 MG tablet Take 60 mg by mouth 3 (three) times daily.    Yes [provider]  sitaGLIPtin (JANUVIA) 100 MG tablet Take 1 tablet (100 mg total) by mouth daily. 09/14/16  Yes Renato Shin, MD  triamcinolone cream (KENALOG) 0.1 % Apply 1 application topically 2 (two) times daily as needed.  01/31/17  Yes [provider]  zolpidem (AMBIEN) 10 MG tablet Take 1 tablet (10 mg total) by mouth at bedtime as needed. 08/16/17  Yes Renato Shin, MD  glucose blood (ONE TOUCH ULTRA TEST) test strip Use to test blood sugar 3 times daily Dx code- E11.9 01/01/17   Renato Shin, MD  Lancets [facility]  ULTRASOFT) lancets Use as instructed to check sugar 3 times daily. Dx E11.9. 12/05/16   Renato Shin, MD  predniSONE (DELTASONE) 20 MG tablet Take 2 tablets (40 mg total) by mouth daily with breakfast. Patient not taking: Reported on 09/17/2017 09/17/17   Renato Shin, MD    Family History No family history on file.  Social History Social History   Tobacco Use  . Smoking status: Never Smoker  . Smokeless tobacco: Never Used  Substance Use Topics  . Alcohol use: No  . Drug use: No     Allergies   Azithromycin and Beta adrenergic blockers   Review of Systems Review of Systems  Constitutional: Negative for fever.  HENT: Positive for voice change.   Eyes: Positive for visual disturbance.  Respiratory: Positive for shortness of breath. Negative for cough.   Cardiovascular: Negative for chest pain.  Gastrointestinal: Negative for abdominal pain, nausea and vomiting.  Genitourinary: Negative for dysuria and hematuria.  Neurological: Positive for weakness and numbness. Negative for headaches.  All other systems  reviewed and are negative.    Physical Exam Updated Vital Signs BP (!) 163/106 (BP Location: Right Arm)   Pulse 97   Temp 97.8 F (36.6 C) (Oral)   Resp 17   SpO2 98%   Physical Exam  Constitutional: He is oriented to person, place, and time. He appears well-developed and well-nourished.  HENT:  Head: Normocephalic and atraumatic.  Mouth/Throat: Oropharynx is clear and moist and mucous membranes are normal.  Eyes: Pupils are equal, round, and reactive to light. Conjunctivae and lids are normal. Right eye exhibits abnormal extraocular motion.  Right EOM appears to be intact when looking lateral and inferiorly.  He does appear to have some mild difficulty moving the eye in the superior direction. PERRL.   Neck: Full passive range of motion without pain.  Cardiovascular: Normal rate, regular rhythm, normal heart sounds and normal pulses. Exam reveals no gallop and no friction rub.  No murmur heard. Pulmonary/Chest: Effort normal and breath sounds normal.  Abdominal: Soft. Normal appearance. There is no tenderness. There is no rigidity and no guarding.  Musculoskeletal: Normal range of motion.  Neurological: He is alert and oriented to person, place, and time.  Right eye droop but otherwise all other cranial nerves III-XII intact Follows commands, Moves all extremities  5/5 strength to BLE.  He diminished strength noted to right upper extremity. Reports subjective decreased sensation noted to the right aspect of his face. When doing finger-to-nose with left hand, he is accurate and able to touch tip of my finger but when doing it with the right hand, he often overshoots and has to correct himself. No dysdiadochokinesia. No pronator drift. No gait abnormalities  No slurred speech.   Skin: Skin is warm and dry. Capillary refill takes less than 2 seconds.  Psychiatric: He has a normal mood and affect. His speech is normal.  Nursing note and vitals reviewed.    ED Treatments /  Results  Labs (all labs ordered are listed, but only abnormal results are displayed) Labs Reviewed  CBC WITH DIFFERENTIAL/PLATELET - Abnormal; Notable for the following components:      Result Value   Neutro Abs 8.3 (*)    All other components within normal limits  COMPREHENSIVE METABOLIC PANEL - Abnormal; Notable for the following components:   Glucose, Bld 281 (*)    All other components within normal limits  BRAIN NATRIURETIC PEPTIDE  PROTIME-INR  I-STAT TROPONIN, ED    EKG EKG  Interpretation  Date/Time:  Monday September 17 2017 11:09:03 EDT Ventricular Rate:  107 PR Interval:  138 QRS Duration: 70 QT Interval:  340 QTC Calculation: 453 R Axis:   76 Text Interpretation:  Sinus tachycardia Otherwise normal ECG since 03/27/05, no changes seen Confirmed by Noemi Chapel [phone]) on 09/17/2017 3:59:55 PM   Radiology Dg Chest 2 View  Result Date: 09/17/2017 CLINICAL DATA:  Patient with worsening shortness of breath. EXAM: CHEST - 2 VIEW COMPARISON:  Chest radiograph 12/02/2007 FINDINGS: Normal cardiac and mediastinal contours. Patient status post median sternotomy. No consolidative pulmonary opacities. No pleural effusion or pneumothorax. Regional skeleton unremarkable. IMPRESSION: No acute cardiopulmonary process. Electronically Signed   By: Lovey Newcomer M.D.   On: 09/17/2017 17:18    Procedures Procedures (including critical care time)  Medications Ordered in ED Medications  Immune Globulin 10% (PRIVIGEN) IV infusion 20 g (has no administration in time range)     Initial Impression / Assessment and Plan / ED Course  I have reviewed the triage vital signs and the nursing notes.  Pertinent labs & imaging results that were available during my care of the patient were reviewed by me and considered in my medical decision making (see chart for details).     62 y.o. M with PMH/o Myasthenia Gravis who presents for evaluation of progressively worsening shortness of breath, eyelid  droop, right face numbness, right upper extremity numbness, voice change. Patient is afebrile, non-toxic appearing, sitting comfortably on examination table. Vital signs reviewed and stable. On exam he does appear to have some decreased sensation noted to the right face.  He has some mild diminished strength noted to right upper extremity.  Right EOM appears to have difficulty going to the superior direction.  Initial labs ordered at triage.  Concern for myasthenia gravis crisis versus CVA.  INR is 0.92.  CMP shows hyperglycemia to 81.  Otherwise unremarkable.  CBC without any significant leukocytosis or anemia.  Troponin negative.  Discussed patient with Dr. Cheral Marker (neurology). Will come evaluate patient in the ED. Recommends obtaining a Forced Vital Capacity and Negative inspiratory force for evaluation of MG crisis.   Discussed with neurology after evaluation of patient in the ED.  Recommend met medical admission for myasthenia gravis crisis.  Recommend starting IVIG during admission, which they will start. Will plan for consult for admission.  Discussed patient with Dr. Jonelle Sidle (hospitalist). Accepts patient for admission.   Final Clinical Impressions(s) / ED Diagnoses   Final diagnoses:  Myasthenia gravis in crisis [facility])    ED Discharge Orders    None       Desma Mcgregor 09/17/17 Doran Heater    Noemi Chapel, MD 09/19/17 1006

## 2017-09-17 NOTE — Progress Notes (Signed)
Pt admitted from ED with c/o of SOB and generalised weakness, pt alert and oriented, denies any pain at this time, pt settled in bed with call light at bedside, safety concern addressed, was however reassured and will continue to monitor. [place]-As[place], Sears Oran Efe

## 2017-09-17 NOTE — Plan of Care (Signed)
Education given. 

## 2017-09-17 NOTE — Progress Notes (Signed)
Spoke with ED RN regarding IVIG order. RN states pt is currently being admitted and pharmacy is to send IVIG to floor.

## 2017-09-17 NOTE — Progress Notes (Signed)
NIF-30 CMH2O, Vital Capacity 1.2 L good effort

## 2017-09-17 NOTE — Telephone Encounter (Signed)
[employer] is calling to check status of forms that were faxed over on aug 9th . They are also requesting progress notes from last follow up  Fax- [phone] Attn: Claim number # [id number]    Phone- [phone] Option 0  Ref is the above claim Number

## 2017-09-17 NOTE — Patient Instructions (Addendum)
Please go to the ER now

## 2017-09-17 NOTE — ED Notes (Signed)
MD Jonelle Sidle updated pt bp. No new orders at this time

## 2017-09-17 NOTE — Consult Note (Addendum)
Neurology Consultation  Reason for Consult: [name] Referring Physician: Sabra Sandoval   History is obtained from: Patient  HPI: [name] is a 62 y.o. male known myasthenia gravis who is taken care of at [facility].  Patient states that back in January he was started on 15 mg of prednisone every other day.  This was changed to 30 mg daily and recently this August it was decreased to 20 mg daily, due to elevated blood sugars. He states that the prednisone increase helped a little but that overall his strength has worsened over the past 3 months.  Patient is also on 60 mg of Mestinon 3 times a day.  Patient states that since July has been having difficulty with breathing in the morning in the evening.  He is also have been having difficulty with swallowing. He has new onset of difficulty clearing secretions from his throat in the AM. He notes that in the evening he is been having difficulty with muscle strength as the more he uses his muscles the weaker he gets.  The main reason that he came in today is because he was getting nervous and scared secondary to having difficulty with breathing, swallowing, vision.  Both family and patient would like to have IVIG. He has received IVIG and PLEX in the past for exacerbations, both with therapeutic effect. He had a thymectomy about 14 years ago.   ROS: A 14 point ROS was performed and is negative except as noted in the HPI.  Past Medical History:  Diagnosis Date  . Anxiety   . Colon polyp   . Depressive disorder, not elsewhere classified   . External hemorrhoid   . Insomnia, unspecified   . Internal hemorrhoids   . Myasthenia gravis without exacerbation ([place])   . Other and unspecified hyperlipidemia   . 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 Type II or unspecified type diabetes mellitus without mention of complication, not stated as uncontrolled   . Viral hepatitis B without mention of hepatic coma, chronic, without mention of  hepatitis delta      No family history on file.   Social History:   reports that he has never smoked. He has never used smokeless tobacco. He reports that he does not drink alcohol or use drugs.  Medications No current facility-administered medications for this encounter.   Current Outpatient Medications:  .  calcium carbonate (OS-CAL) 600 MG TABS, Take 600 mg by mouth 3 (three) times daily with meals.  , Disp: , Rfl:  .  Cholecalciferol (VITAMIN D) 2000 UNITS tablet, Take 2,000 Units by mouth daily.  , Disp: , Rfl:  .  colestipol (COLESTID) 1 g tablet, TAKE FIVE TABLETS BY MOUTH DAILY (Patient taking differently: Take 5 g by mouth daily. ), Disp: 450 tablet, Rfl: 2 .  desonide (DESONATE) 0.05 % gel, Apply 1 application topically 2 (two) times daily as needed. , Disp: , Rfl:  .  entecavir (BARACLUDE) 1 MG tablet, Take 1 tablet (1 mg total) by mouth daily., Disp: 30 tablet, Rfl: 6 .  fluocinonide ointment (LIDEX) 1.96 %, Apply 1 application topically 2 (two) times daily as needed. , Disp: , Rfl:  .  FLUoxetine (PROZAC) 20 MG capsule, Take 1 capsule (20 mg total) by mouth daily., Disp: 90 capsule, Rfl: 0 .  ketoconazole (NIZORAL) 2 % cream, Apply 1 application topically 2 (two) times daily as needed for irritation. , Disp: , Rfl:  .  metFORMIN (GLUCOPHAGE-XR) 500 MG 24 hr  tablet, TAKE TWO TABLETS BY MOUTH TWICE A DAY (Patient taking differently: Take 1,000 mg by mouth 2 (two) times daily. ), Disp: 360 tablet, Rfl: 0 .  omeprazole (PRILOSEC) 20 MG capsule, TAKE ONE CAPSULE BY MOUTH DAILY (Patient taking differently: Take 20 mg by mouth daily. ), Disp: 30 capsule, Rfl: 0 .  pimecrolimus (ELIDEL) 1 % cream, Apply 1 application topically 2 (two) times daily as needed. , Disp: , Rfl:  .  predniSONE (DELTASONE) 10 MG tablet, Take 20 mg by mouth daily with breakfast., Disp: , Rfl:  .  pyridostigmine (MESTINON) 60 MG tablet, Take 60 mg by mouth 3 (three) times daily. , Disp: , Rfl:  .  sitaGLIPtin  (JANUVIA) 100 MG tablet, Take 1 tablet (100 mg total) by mouth daily., Disp: 30 tablet, Rfl: 11 .  triamcinolone cream (KENALOG) 0.1 %, Apply 1 application topically 2 (two) times daily as needed. , Disp: , Rfl:  .  zolpidem (AMBIEN) 10 MG tablet, Take 1 tablet (10 mg total) by mouth at bedtime as needed., Disp: 30 tablet, Rfl: 4 .  glucose blood (ONE TOUCH ULTRA TEST) test strip, Use to test blood sugar 3 times daily Dx code- E11.9, Disp: 100 each, Rfl: 3 .  Lancets (ONETOUCH ULTRASOFT) lancets, Use as instructed to check sugar 3 times daily. Dx E11.9., Disp: 300 each, Rfl: 5 .  predniSONE (DELTASONE) 20 MG tablet, Take 2 tablets (40 mg total) by mouth daily with breakfast. (Patient not taking: Reported on 09/17/2017), Disp: 60 tablet, Rfl: 0   Exam: Current vital signs: BP (!) 163/106 (BP Location: Right Arm)   Pulse 97   Temp 97.8 F (36.6 C) (Oral)   Resp 17   SpO2 98%  Vital signs in last 24 hours: Temp:  [97.8 F (36.6 C)-98.2 F (36.8 C)] 97.8 F (36.6 C) (08/19 1614) Pulse Rate:  [96-109] 97 (08/19 1614) Resp:  [16-17] 17 (08/19 1614) BP: (137-163)/(98-109) 163/106 (08/19 1614) SpO2:  [95 %-99 %] 98 % (08/19 1614) Weight:  [55.6 kg] 55.6 kg (08/19 0958)  GENERAL: Awake, alert in NAD HEENT: - Normocephalic and atraumatic,  LUNGS - Clear to auscultation bilaterally with no wheezes Ext: warm, well perfused, intact peripheral pulses,  NEURO:  Mental Status: AA&Ox3, speech is initially clear however the longer he talks it is noticeable that he becomes hypophonic.  Naming, repetition, fluency, and comprehension intact. Cranial Nerves: PERRL 2 mm/brisk. EOMI--is noticeable that he has decreased vertical gaze with his right eye along with ptosis of his right eye.  Complete extraocular motions of left eye.  Weakness of bilateral eyelid closure.  Does complain of some double vision.  He does have full 5/5 strength of neck extension and flexion along with bilateral sternocleidomastoid.   He has good strength with jaw opening. visual fields full, no facial asymmetry, facial sensation intact, hearing intact, tongue midline. Motor: 4/5 throughout with decrement in strength after sustaining muscle contraction. Also, with fist pumping he does show significant decrease in strength. Tone is normal and bulk is normal Sensation- Intact to light touch bilaterally Coordination: FTN intact bilaterally, no ataxia in BLE. Gait- deferred  Labs I have reviewed labs in epic and the results pertinent to this consultation are:   CBC    Component Value Date/Time   WBC 9.9 09/17/2017 1108   RBC 5.55 09/17/2017 1108   HGB 16.2 09/17/2017 1108   HCT 48.0 09/17/2017 1108   PLT 206 09/17/2017 1108   MCV 86.5 09/17/2017 1108   [place]  29.2 09/17/2017 1108   MCHC 33.8 09/17/2017 1108   RDW 12.6 09/17/2017 1108   LYMPHSABS 1.4 09/17/2017 1108   MONOABS 0.1 09/17/2017 1108   EOSABS 0.0 09/17/2017 1108   BASOSABS 0.0 09/17/2017 1108    CMP     Component Value Date/Time   NA 138 09/17/2017 1108   K 4.3 09/17/2017 1108   CL 99 09/17/2017 1108   CO2 30 09/17/2017 1108   GLUCOSE 281 (H) 09/17/2017 1108   BUN 9 09/17/2017 1108   CREATININE 1.04 09/17/2017 1108   CALCIUM 9.6 09/17/2017 1108   CALCIUM 10.3 11/23/2008 2224   PROT 7.1 09/17/2017 1108   ALBUMIN 4.2 09/17/2017 1108   AST 23 09/17/2017 1108   ALT 25 09/17/2017 1108   ALKPHOS 41 09/17/2017 1108   BILITOT 1.0 09/17/2017 1108   GFRNONAA >60 09/17/2017 1108   GFRAA >60 09/17/2017 1108    Lipid Panel     Component Value Date/Time   CHOL 170 12/25/2016 1056   TRIG 209.0 (H) 12/25/2016 1056   TRIG 133 02/09/2006 0800   HDL 48.40 12/25/2016 1056   CHOLHDL 4 12/25/2016 1056   VLDL 41.8 (H) 12/25/2016 1056   LDLCALC 68 07/23/2008 1547   LDLDIRECT 87.0 12/25/2016 1056     Imaging I have reviewed the images obtained: CT head ordered  Assessment: Myasthenia exacerbation.  At this point patient does have a 1.5 L vital  capacity and we are ordering NIF as well.  Recommendations: -IVIG for 5 days at 0.4 g/kg qd. -NIF and FVC every 4 hours -Would recommend patient being in stepdown unit so we can have be observed closer. --Continue Mestinon at 60 mg PO TID. --Swallow evaluation --Decrease prednisone back to 15 mg po QOD.   I have seen and examined the patient. I have amended the assessment and plan above.  Electronically signed: Dr. Kerney Elbe

## 2017-09-17 NOTE — Progress Notes (Signed)
Subjective:    Patient ID: [name], male    DOB: [birth year], 62 y.o.   MRN: [id number]  HPI Few weeks of moderate intermitt headache, worse at the right frontal area.  He has assoc muscle weakness.  He saw neurol, who increased prednisone a few weeks ago, the reduced again 1 week ago.  He now takes 20 mg qd.   Past Medical History:  Diagnosis Date  . Anxiety   . Colon polyp   . Depressive disorder, not elsewhere classified   . External hemorrhoid   . Insomnia, unspecified   . Internal hemorrhoids   . Myasthenia gravis without exacerbation ([place])   . Other and unspecified hyperlipidemia   . 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 Type II or unspecified type diabetes mellitus without mention of complication, not stated as uncontrolled   . Viral hepatitis B without mention of hepatic coma, chronic, without mention of hepatitis delta     Past Surgical History:  Procedure Laterality Date  . THYMECTOMY      Social History   Socioeconomic History  . Marital status: Married    Spouse name: Not on file  . Number of children: Not on file  . Years of education: Not on file  . Highest education level: Not on file  Occupational History  . Not on file  Social Needs  . Financial resource strain: Not on file  . Food insecurity:    Worry: Not on file    Inability: Not on file  . Transportation needs:    Medical: Not on file    Non-medical: Not on file  Tobacco Use  . Smoking status: Never Smoker  . Smokeless tobacco: Never Used  Substance and Sexual Activity  . Alcohol use: No  . Drug use: No  . Sexual activity: Not on file  Lifestyle  . Physical activity:    Days per week: Not on file    Minutes per session: Not on file  . Stress: Not on file  Relationships  . Social connections:    Talks on phone: Not on file    Gets together: Not on file    Attends religious service: Not on file    Active member of club or organization: Not on file    Attends  meetings of clubs or organizations: Not on file    Relationship status: Not on file  . Intimate partner violence:    Fear of current or ex partner: Not on file    Emotionally abused: Not on file    Physically abused: Not on file    Forced sexual activity: Not on file  Other Topics Concern  . Not on file  Social History Narrative  . Not on file    Current Outpatient Medications on File Prior to Visit  Medication Sig Dispense Refill  . calcium carbonate (OS-CAL) 600 MG TABS Take 600 mg by mouth 3 (three) times daily with meals.      . Cholecalciferol (VITAMIN D) 2000 UNITS tablet Take 2,000 Units by mouth daily.      . colestipol (COLESTID) 1 g tablet TAKE FIVE TABLETS BY MOUTH DAILY 450 tablet 2  . desonide (DESONATE) 0.05 % gel Apply topically 2 (two) times daily.    Marland Kitchen entecavir (BARACLUDE) 1 MG tablet Take 1 tablet (1 mg total) by mouth daily. 30 tablet 6  . FLUoxetine (PROZAC) 20 MG capsule Take 1 capsule (20 mg total) by mouth daily. 90 capsule 0  .  glucose blood (ONE TOUCH ULTRA TEST) test strip Use to test blood sugar 3 times daily Dx code- E11.9 100 each 3  . Ibuprofen (ADVIL) 200 MG CAPS Take by mouth.    Marland Kitchen ketoconazole (NIZORAL) 2 % cream     . Lancets (ONETOUCH ULTRASOFT) lancets Use as instructed to check sugar 3 times daily. Dx E11.9. 300 each 5  . metFORMIN (GLUCOPHAGE-XR) 500 MG 24 hr tablet TAKE TWO TABLETS BY MOUTH TWICE A DAY 360 tablet 0  . omeprazole (PRILOSEC) 20 MG capsule TAKE ONE CAPSULE BY MOUTH DAILY 30 capsule 0  . pimecrolimus (ELIDEL) 1 % cream Apply topically.    . pyridostigmine (MESTINON) 60 MG tablet Take 60 mg by mouth 3 (three) times daily.     . sitaGLIPtin (JANUVIA) 100 MG tablet Take 1 tablet (100 mg total) by mouth daily. 30 tablet 11  . triamcinolone cream (KENALOG) 0.1 %     . zolpidem (AMBIEN) 10 MG tablet Take 1 tablet (10 mg total) by mouth at bedtime as needed. 30 tablet 4  . fluocinonide ointment (LIDEX) 0.05 %      No current  facility-administered medications on file prior to visit.     Allergies  Allergen Reactions  . Azithromycin     REACTION: exac of his mg  . Beta Adrenergic Blockers     REACTION: exac of mg    No family history on file.  BP (!) 140/98 (BP Location: Right Arm, Patient Position: Sitting, Cuff Size: Normal)   Pulse (!) 107   Temp 98.2 F (36.8 C) (Oral)   Ht 5\' 5"  (1.651 m)   Wt 122 lb 9.6 oz (55.6 kg)   SpO2 98%   BMI 20.40 kg/m    Review of Systems He also has slight dysphagia, but no odynophagia.  He reports sob and he fell last week    Objective:   Physical Exam VITAL SIGNS:  See vs page GENERAL: no distress CN 2-12 intact bilat Motor diffusely normal, except facial muscles are weak Neuro: sensation is intact to touch on all 4's.   Gait: normal and steady.        Assessment & Plan:  HTN MG: worse Headache: new, uncertain etiology  Patient Instructions  Please go to the ER now.

## 2017-09-17 NOTE — Progress Notes (Signed)
NIF -28 cmH2O and FVC 1.8L with good effort.

## 2017-09-17 NOTE — ED Notes (Signed)
Report called to 3w rn

## 2017-09-17 NOTE — ED Notes (Signed)
Neurologist at bedside. 

## 2017-09-17 NOTE — H&P (Signed)
History and Physical    [name] PJK:[id number] DOB: 1955-09-02 DOA: 09/17/2017  Referring MD/NP/PA: Dr. [name]  PCP: Renato Shin, MD   Patient coming from: Home  Chief Complaint: Weakness  HPI: [name] is a 62 y.o. male with medical history significant of asthenia gravis status post thymectomy, anxiety, hyperglycemia from steroid use who has been getting treatment at [facility] and has been mainly on prednisone.  Patient has had IVIG in the past.  He has his steroids titrated from 50 mg every other day all the way to 30 mg.  He is also taking Mestinon 3 times a day.  Currently he is on prednisone 20 mg daily.  Patient came in due to worsening symptoms and evidence of flareup of his myasthenia gravis.  He was seen in the ER by neurology.  Patient and family want to have IVIG.  He is having difficulty swallowing.  Difficulty clearing his secretions and is clear ptosis of the right eye.  His muscles are getting weaker with any small activities.  ED Course: Patient was evaluated by neurology in the ER.  White count is 11.7 hemoglobin 17.2.  Creatinine is 1.02.  Blood pressure 163/106 and pulse 109.  Head CT without contrast as well as chest x-ray are within normal limits.  Patient's family discussed with neurology and they are willing to get IVIG so he is being admitted for treatment  Review of Systems: As per HPI otherwise 10 point review of systems negative.    Past Medical History:  Diagnosis Date  . Anxiety   . Colon polyp   . Depressive disorder, not elsewhere classified   . External hemorrhoid   . Insomnia, unspecified   . Internal hemorrhoids   . Myasthenia gravis without exacerbation ([place])   . Other and unspecified hyperlipidemia   . 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 Type II or unspecified type diabetes mellitus without mention of complication, not stated as uncontrolled   . Viral hepatitis B without mention of hepatic  coma, chronic, without mention of hepatitis delta     Past Surgical History:  Procedure Laterality Date  . THYMECTOMY       reports that he has never smoked. He has never used smokeless tobacco. He reports that he does not drink alcohol or use drugs.  Allergies  Allergen Reactions  . Azithromycin Other (See Comments)    REACTION: exac of his mg  . Beta Adrenergic Blockers Other (See Comments)    REACTION: exac of mg    No family history on file.   Prior to Admission medications   Medication Sig Start Date End Date Taking? Authorizing Provider  calcium carbonate (OS-CAL) 600 MG TABS Take 600 mg by mouth 3 (three) times daily with meals.     Yes [provider]  Cholecalciferol (VITAMIN D) 2000 UNITS tablet Take 2,000 Units by mouth daily.     Yes [provider]  colestipol (COLESTID) 1 g tablet TAKE FIVE TABLETS BY MOUTH DAILY Patient taking differently: Take 5 g by mouth daily.  09/06/17  Yes Renato Shin, MD  desonide (DESONATE) 0.05 % gel Apply 1 application topically 2 (two) times daily as needed.    Yes [provider]  entecavir (BARACLUDE) 1 MG tablet Take 1 tablet (1 mg total) by mouth daily. 07/13/11  Yes Marty Heck, MD  fluocinonide ointment (LIDEX) 8.09 % Apply 1 application topically 2 (two) times daily as needed.  08/28/17  Yes [provider]  FLUoxetine (PROZAC) 20 MG capsule Take 1 capsule (20 mg total) by mouth daily. 07/17/17  Yes Renato Shin, MD  ketoconazole (NIZORAL) 2 % cream Apply 1 application topically 2 (two) times daily as needed for irritation.  10/19/11  Yes [provider]  metFORMIN (GLUCOPHAGE-XR) 500 MG 24 hr tablet TAKE TWO TABLETS BY MOUTH TWICE A DAY Patient taking differently: Take 1,000 mg by mouth 2 (two) times daily.  09/13/17  Yes Renato Shin, MD  omeprazole (PRILOSEC) 20 MG capsule TAKE ONE CAPSULE BY MOUTH DAILY Patient taking differently: Take 20 mg by mouth daily.  09/06/17  Yes Renato Shin,  MD  pimecrolimus (ELIDEL) 1 % cream Apply 1 application topically 2 (two) times daily as needed.  01/01/13  Yes [provider]  predniSONE (DELTASONE) 10 MG tablet Take 20 mg by mouth daily with breakfast.   Yes [provider]  pyridostigmine (MESTINON) 60 MG tablet Take 60 mg by mouth 3 (three) times daily.    Yes [provider]  sitaGLIPtin (JANUVIA) 100 MG tablet Take 1 tablet (100 mg total) by mouth daily. 09/14/16  Yes Renato Shin, MD  triamcinolone cream (KENALOG) 0.1 % Apply 1 application topically 2 (two) times daily as needed.  01/31/17  Yes [provider]  zolpidem (AMBIEN) 10 MG tablet Take 1 tablet (10 mg total) by mouth at bedtime as needed. 08/16/17  Yes Renato Shin, MD  glucose blood (ONE TOUCH ULTRA TEST) test strip Use to test blood sugar 3 times daily Dx code- E11.9 01/01/17   Renato Shin, MD  Lancets [facility] ULTRASOFT) lancets Use as instructed to check sugar 3 times daily. Dx E11.9. 12/05/16   Renato Shin, MD  predniSONE (DELTASONE) 20 MG tablet Take 2 tablets (40 mg total) by mouth daily with breakfast. Patient not taking: Reported on 09/17/2017 09/17/17   Renato Shin, MD    Physical Exam: Vitals:   09/17/17 1104 09/17/17 1247 09/17/17 1454 09/17/17 1614  BP: (!) 149/109 (!) 141/99 (!) 137/99 (!) 163/106  Pulse: (!) 109 96 (!) 102 97  Resp:  17 16 17   Temp: 98 F (36.7 C)   97.8 F (36.6 C)  TempSrc: Oral   Oral  SpO2: 95% 99% 98% 98%      Constitutional: NAD, calm, comfortable Vitals:   09/17/17 1104 09/17/17 1247 09/17/17 1454 09/17/17 1614  BP: (!) 149/109 (!) 141/99 (!) 137/99 (!) 163/106  Pulse: (!) 109 96 (!) 102 97  Resp:  17 16 17   Temp: 98 F (36.7 C)   97.8 F (36.6 C)  TempSrc: Oral   Oral  SpO2: 95% 99% 98% 98%   Eyes: PERRL, Ptosis ENMT: Mucous membranes are moist. Posterior pharynx clear of any exudate or lesions.Normal dentition.  Neck: normal, supple, no masses, no thyromegaly Respiratory: clear  to auscultation bilaterally, no wheezing, no crackles. Normal respiratory effort. No accessory muscle use.  Cardiovascular: Regular rate and rhythm, no murmurs / rubs / gallops. No extremity edema. 2+ pedal pulses. No carotid bruits.  Abdomen: no tenderness, no masses palpated. No hepatosplenomegaly. Bowel sounds positive.  Musculoskeletal: no clubbing / cyanosis. No joint deformity upper and lower extremities. Good ROM, no contractures.  Decreased muscle tone.  Skin: no rashes, lesions, ulcers. No induration Neurologic: CN 2-12 grossly intact. Sensation intact, DTR normal. Strength 5/5 in all 4.  Psychiatric: Normal judgment and insight. Alert and oriented x 3. Normal mood.   Labs on Admission: I have personally reviewed following labs and imaging  studies  CBC: Recent Labs  Lab 09/17/17 1108  WBC 9.9  NEUTROABS 8.3*  HGB 16.2  HCT 48.0  MCV 86.5  PLT 703   Basic Metabolic Panel: Recent Labs  Lab 09/17/17 1108  NA 138  K 4.3  CL 99  CO2 30  GLUCOSE 281*  BUN 9  CREATININE 1.04  CALCIUM 9.6   GFR: Estimated Creatinine Clearance: 58.7 mL/min (by C-G formula based on SCr of 1.04 mg/dL). Liver Function Tests: Recent Labs  Lab 09/17/17 1108  AST 23  ALT 25  ALKPHOS 41  BILITOT 1.0  PROT 7.1  ALBUMIN 4.2   No results for input(s): LIPASE, AMYLASE in the last 168 hours. No results for input(s): AMMONIA in the last 168 hours. Coagulation Profile: Recent Labs  Lab 09/17/17 1644  INR 0.92   Cardiac Enzymes: No results for input(s): CKTOTAL, CKMB, CKMBINDEX, TROPONINI in the last 168 hours. BNP (last 3 results) No results for input(s): PROBNP in the last 8760 hours. HbA1C: No results for input(s): HGBA1C in the last 72 hours. CBG: No results for input(s): GLUCAP in the last 168 hours. Lipid Profile: No results for input(s): CHOL, HDL, LDLCALC, TRIG, CHOLHDL, LDLDIRECT in the last 72 hours. Thyroid Function Tests: No results for input(s): TSH, T4TOTAL, FREET4,  T3FREE, THYROIDAB in the last 72 hours. Anemia Panel: No results for input(s): VITAMINB12, FOLATE, FERRITIN, TIBC, IRON, RETICCTPCT in the last 72 hours. Urine analysis:    Component Value Date/Time   COLORURINE YELLOW 12/25/2016 1056   APPEARANCEUR CLEAR 12/25/2016 1056   LABSPEC 1.010 12/25/2016 1056   PHURINE 8.0 12/25/2016 1056   GLUCOSEU NEGATIVE 12/25/2016 1056   HGBUR NEGATIVE 12/25/2016 [place] 12/25/2016 1056   KETONESUR NEGATIVE 12/25/2016 1056   PROTEINUR NEGATIVE 01/27/2007 1715   UROBILINOGEN 0.2 12/25/2016 1056   NITRITE NEGATIVE 12/25/2016 1056   LEUKOCYTESUR NEGATIVE 12/25/2016 1056   Sepsis Labs: @LABRCNTIP (procalcitonin:4,lacticidven:4) )No results found for this or any previous visit (from the past 240 hour(s)).   Radiological Exams on Admission: Dg Chest 2 View  Result Date: 09/17/2017 CLINICAL DATA:  Patient with worsening shortness of breath. EXAM: CHEST - 2 VIEW COMPARISON:  Chest radiograph 12/02/2007 FINDINGS: Normal cardiac and mediastinal contours. Patient status post median sternotomy. No consolidative pulmonary opacities. No pleural effusion or pneumothorax. Regional skeleton unremarkable. IMPRESSION: No acute cardiopulmonary process. Electronically Signed   By: Lovey Newcomer M.D.   On: 09/17/2017 17:18    EKG: Independently reviewed. Shows Normal sinus rhythm and no ST changes.  Assessment/Plan Principal Problem:   Myasthenia gravis with acute exacerbation (HCC) Active Problems:   Chronic hepatitis B virus infection ([place])   Diabetes ([place])   HYPERCHOLESTEROLEMIA    #1 acute exacerbation of myasthenia gravis: Patient has been evaluated by neurology.  He will be receiving IVIG.  We will change his prednisone dose to previous 50 mill grams every other day due to hypoglycemia.  Continue care according to neurology.  PT and OT consultation.  #2 diabetes: Largely secondary to steroid use.  Sliding scale insulin will be continued  with.  #3 hyperlipidemia: Continue home regiment.  #4 chronic hepatitis C infection: Appears to be at baseline.   DVT prophylaxis: Lovenox Code Status: Full code Family Communication: Wife and daughter at bedside Disposition Plan: Home Consults called: Neurology Dr. Cheral Marker Admission status: Inpatient  Severity of Illness: The appropriate patient status for this patient is INPATIENT. Inpatient status is judged to be reasonable and necessary in order to provide the  required intensity of service to ensure the patient's safety. The patient's presenting symptoms, physical exam findings, and initial radiographic and laboratory data in the context of their chronic comorbidities is felt to place them at high risk for further clinical deterioration. Furthermore, it is not anticipated that the patient will be medically stable for discharge from the hospital within 2 midnights of admission. The following factors support the patient status of inpatient.   " The patient's presenting symptoms include Weakness, . " The worrisome physical exam findings include ptosis. " The initial radiographic and laboratory data are worrisome because of No new findings. " The chronic co-morbidities include Myathenia gravis.   * I certify that at the point of admission it is my clinical judgment that the patient will require inpatient hospital care spanning beyond 2 midnights from the point of admission due to high intensity of service, high risk for further deterioration and high frequency of surveillance required.Barbette Merino MD [facility] Pager [phone]  If 7PM-7AM, please contact night-coverage [email or web address] Password [facility]  09/17/2017, 8:15 PM

## 2017-09-18 ENCOUNTER — Other Ambulatory Visit: Payer: Self-pay

## 2017-09-18 ENCOUNTER — Encounter (HOSPITAL_COMMUNITY): Payer: Self-pay | Admitting: *Deleted

## 2017-09-18 LAB — COMPREHENSIVE METABOLIC PANEL
ALK PHOS: 30 U/L — AB (ref 38–126)
ALT: 23 U/L (ref 0–44)
AST: 20 U/L (ref 15–41)
Albumin: 3.5 g/dL (ref 3.5–5.0)
Anion gap: 9 (ref 5–15)
BUN: 9 mg/dL (ref 8–23)
CALCIUM: 8.7 mg/dL — AB (ref 8.9–10.3)
CHLORIDE: 102 mmol/L (ref 98–111)
CO2: 30 mmol/L (ref 22–32)
CREATININE: 0.96 mg/dL (ref 0.61–1.24)
GFR calc non Af Amer: >60 mL/min (ref >60)
Glucose, Bld: 106 mg/dL — ABNORMAL HIGH (ref 70–99)
Potassium: 3.6 mmol/L (ref 3.5–5.1)
Sodium: 141 mmol/L (ref 135–145)
Total Bilirubin: 1.6 mg/dL — ABNORMAL HIGH (ref 0.3–1.2)
Total Protein: 6.6 g/dL (ref 6.5–8.1)

## 2017-09-18 LAB — GLUCOSE, CAPILLARY
GLUCOSE-CAPILLARY: 105 mg/dL — AB (ref 70–99)
GLUCOSE-CAPILLARY: 84 mg/dL (ref 70–99)
Glucose-Capillary: 181 mg/dL — ABNORMAL HIGH (ref 70–99)
Glucose-Capillary: 117 mg/dL — ABNORMAL HIGH (ref 70–99)

## 2017-09-18 LAB — CBC
HCT: 44 % (ref 39.0–52.0)
Hemoglobin: 14.8 g/dL (ref 13.0–17.0)
MCH: 29.1 pg (ref 26.0–34.0)
MCHC: 33.6 g/dL (ref 30.0–36.0)
MCV: 86.4 fL (ref 78.0–100.0)
PLATELETS: 168 10*3/uL (ref 150–400)
RBC: 5.09 MIL/uL (ref 4.22–5.81)
RDW: 12.8 % (ref 11.5–15.5)
WBC: 10.4 10*3/uL (ref 4.0–10.5)

## 2017-09-18 LAB — HIV ANTIBODY (ROUTINE TESTING W REFLEX): HIV SCREEN 4TH GENERATION: NONREACTIVE

## 2017-09-18 MED ORDER — SITAGLIPTIN PHOSPHATE 100 MG PO TABS
100.0000 mg | ORAL_TABLET | Freq: Every day | ORAL | Status: DC
  Administered 2017-09-18 – 2017-09-22 (×5): 100 mg via ORAL
  Filled 2017-09-18 (×5): qty 1

## 2017-09-18 NOTE — Progress Notes (Signed)
IVIG initiated by IV team  RN HEATHER  at bedside now VS STABLE

## 2017-09-18 NOTE — Progress Notes (Signed)
IVIG completed, flushed with 23ml of D5 water, pt calm in bed, denies any discomfort, v/s stable, will continue to monitor. [place], Vonita Calloway Efe

## 2017-09-18 NOTE — Telephone Encounter (Signed)
I have called [employer] & have answered their needed questions.

## 2017-09-18 NOTE — Progress Notes (Signed)
NIF -20, FVC 1.8L best of three attempts.  Good effort.

## 2017-09-18 NOTE — Evaluation (Signed)
Physical Therapy Evaluation Patient Details Name: [name] MRN: [id number] DOB: Jul 17, 1955 Today's Date: 09/18/2017   History of Present Illness  Pt is a 62 y/o male admitted secondary to progressive weakness and SOB. Thought to be secondary to Myasthenia Gravis exacerbation. PMH includes DM, Hepatitis B, PTSD, malignant thyoma s/p resection.   Clinical Impression  Pt admitted secondary to problem above with deficits below. Pt requiring gross supervision to min guard for mobility this session. Educated about importance of recognizing fatigue and rest breaks. Pt reports he is feeling much better and is close to baseline, and does not feel like he will need follow up PT. Will continue to follow acutely to ensure safety and independence with mobility.     Follow Up Recommendations No PT follow up    Equipment Recommendations  None recommended by PT    Recommendations for Other Services       Precautions / Restrictions Precautions Precautions: None Restrictions Weight Bearing Restrictions: No      Mobility  Bed Mobility Overal bed mobility: Modified Independent                Transfers Overall transfer level: Needs assistance Equipment used: None Transfers: Sit to/from Stand Sit to Stand: Min guard         General transfer comment: Min guard for safety.   Ambulation/Gait Ambulation/Gait assistance: Min guard;Supervision Gait Distance (Feet): 150 Feet Assistive device: None Gait Pattern/deviations: Step-through pattern;Decreased stride length Gait velocity: Decreased    General Gait Details: Slow, very cautious gait. Overall steady requiring supervision to min guard for safety.   Stairs            Wheelchair Mobility    Modified Rankin (Stroke Patients Only)       Balance Overall balance assessment: Needs assistance Sitting-balance support: No upper extremity supported;Feet supported Sitting balance-Leahy Scale: Good     Standing balance  support: No upper extremity supported;During functional activity Standing balance-Leahy Scale: Fair                               Pertinent Vitals/Pain Pain Assessment: No/denies pain    Home Living Family/patient expects to be discharged to:: Private residence Living Arrangements: Spouse/significant other;Children Available Help at Discharge: Family;Available 24 hours/day Type of Home: House Home Access: Stairs to enter Entrance Stairs-Rails: None Entrance Stairs-Number of Steps: 2 Home Layout: One level Home Equipment: Cane - single point      Prior Function Level of Independence: Independent               Hand Dominance        Extremity/Trunk Assessment   Upper Extremity Assessment Upper Extremity Assessment: Overall WFL for tasks assessed    Lower Extremity Assessment Lower Extremity Assessment: Generalized weakness    Cervical / Trunk Assessment Cervical / Trunk Assessment: Normal  Communication   Communication: No difficulties  Cognition Arousal/Alertness: Awake/alert Behavior During Therapy: WFL for tasks assessed/performed Overall Cognitive Status: Within Functional Limits for tasks assessed                                        General Comments General comments (skin integrity, edema, etc.): Educated about recognizing signs of fatigue and importance of rest breaks to allow for recovery.     Exercises     Assessment/Plan    PT Assessment  Patient needs continued PT services  PT Problem List Decreased activity tolerance;Decreased strength;Decreased mobility;Decreased knowledge of precautions       PT Treatment Interventions Gait training;Stair training;Functional mobility training;Therapeutic activities;Therapeutic exercise;Patient/family education    PT Goals (Current goals can be found in the Care Plan section)  Acute Rehab PT Goals Patient Stated Goal: "to get stronger"  PT Goal Formulation: With  patient Time For Goal Achievement: 10/02/17 Potential to Achieve Goals: Good    Frequency Min 3X/week   Barriers to discharge        Co-evaluation               AM-PAC PT "6 Clicks" Daily Activity  Outcome Measure Difficulty turning over in bed (including adjusting bedclothes, sheets and blankets)?: None Difficulty moving from lying on back to sitting on the side of the bed? : A Little Difficulty sitting down on and standing up from a chair with arms (e.g., wheelchair, bedside commode, etc,.)?: Unable Help needed moving to and from a bed to chair (including a wheelchair)?: A Little Help needed walking in hospital room?: A Little Help needed climbing 3-5 steps with a railing? : A Little 6 Click Score: 17    End of Session Equipment Utilized During Treatment: Gait belt Activity Tolerance: Patient tolerated treatment well Patient left: in chair;with call bell/phone within reach Nurse Communication: Mobility status PT Visit Diagnosis: Muscle weakness (generalized) (M62.81);Other abnormalities of gait and mobility (R26.89)    Time: 0263-7858 PT Time Calculation (min) (ACUTE ONLY): 14 min   Charges:   PT Evaluation $PT Eval Low Complexity: 1 Low          Leighton Ruff, PT, DPT  Acute Rehabilitation Services  Pager: [phone]   Rudean Hitt 09/18/2017, 5:00 PM

## 2017-09-18 NOTE — Progress Notes (Signed)
[facility] PROGRESS NOTE  Macintyre Alexa MVH:[id number] DOB: 04-25-1955 DOA: 09/17/2017 PCP: Renato Shin, MD   Narrative: 62 year old [place]ian.male Chronic hep B followed by [facility] all Hayashi-suppressed on Entecavir--Screening US from 04/04/2016 showed no HCC , PTSD, malignant thymoma status post XRT resection 2003, seropositive myasthenia (R ACH R+, MUSK unknown)-note that high dosages of steroids caused mood lability-he is on chronic steroids and Mestinon under care of neurology Seen most recently Dr. Scheryl Marten, [facility] 09/11/2017-at that visit noted increase in subjective swallowing's chewing fatigue difficulty swallowing numbness of his chin Maintenance medications prednisone 30, Mestinon 60 3 times daily  Admitted 8/19 with potential flare of myasthenia BC 11 hemoglobin 17 creatinine 1.0 BP 163/106 pulse 109 CT chest without contrast and CXR negative   A & Plan Myasthenia gravis-defer to neurology planning-IVIG X 5/7-0.4G/KG daily, NIF FVC every 4, Mestinon 60 3 times daily-prednisone 15 every other day-further management to determine as per neurologist  DM TY 2-resume metformin XR ID-continue Januvia 100 daily-a1c good 5.9 Continue meds  Chronic hep B with no evidence of HCC on recent scanning [facility] Entecavir 1 mg daily  PTSD continue Prozac 20 daily  HLD  Insomnia-continue Ambien 10 at bedtime   Lovenox, full code, none, inpatient    Niko Jakel, MD  [facility] Direct contact: [phone] --Via amion app OR  --[email or web address]; password [email or web address]  7PM-7AM contact night coverage as above 09/18/2017, 7:52 AM  LOS: 1 day   Consultants:  Neurology  Procedures:  IVIG  Antimicrobials:  None  Interval history/Subjective:  Awake alert pleasant no distress feels much better than previously No chest pain No fever Breathing okay   Objective:  Vitals:  Vitals:   09/18/17 0220 09/18/17 0433  BP: 109/88 118/87   Pulse: 75 87  Resp: 18 18  Temp: 98 F (36.7 C) 98.4 F (36.9 C)  SpO2: 97% 96%    Exam:  Pleasant slim Asian male no distress no thyromegaly no submandibular lymphadenopathy S1-S2 no murmur rub or gallop Chest clinically clear without added sound Power 5/5 grossly Abdomen soft nontender no rebound No lower extremity edema   I have personally reviewed the following:   Labs:  Bilirubin 1.6  WBC down from 11.7-10.4, platelet 168  A1c 5.9  Imaging studies:  But no findings on chest x-ray CT head  Medical tests:  n   Test discussed with performing physician:  n  Decision to obtain old records:  n  Review and summation of old records:  n  Scheduled Meds: . calcium carbonate  1,250 mg Oral TID WC  . cholecalciferol  2,000 Units Oral Daily  . colestipol  5 g Oral Daily  . enoxaparin (LOVENOX) injection  40 mg Subcutaneous QHS  . entecavir  1 mg Oral Daily  . FLUoxetine  20 mg Oral Daily  . hydrocortisone cream   Topical QID  . insulin aspart  0-9 Units Subcutaneous TID WC  . linagliptin  5 mg Oral Daily  . pantoprazole  40 mg Oral Daily  . predniSONE  15 mg Oral QODAY  . pyridostigmine  60 mg Oral TID   Continuous Infusions: . sodium chloride 100 mL/hr at 09/17/17 2309  . Immune Globulin 10% 0 g (09/17/17 2348)    Principal Problem:   Myasthenia gravis with acute exacerbation ([place]) Active Problems:   Chronic hepatitis B virus infection ([place])   Diabetes ([place])   HYPERCHOLESTEROLEMIA   LOS: 1 day

## 2017-09-18 NOTE — Progress Notes (Signed)
NIF -25 FVC 2.2L  Good patient effort.  RT will continue to monitor.

## 2017-09-18 NOTE — Care Management Note (Signed)
Case Management Note  Patient Details  Name: [name] MRN: [id number] Date of Birth: 10-28-1955  Subjective/Objective:      Pt admitted with myasthenia gravis acute exacerbation. He is from home with spouse. PCP: Dr Tami Ribas; medicare              Action/Plan: MD please order PT/OT as needed. CM following for d/c needs, physician orders.   Expected Discharge Date:                  Expected Discharge Plan:  Home/Self Care  In-House Referral:     Discharge planning Services     Post Acute Care Choice:    Choice offered to:     DME Arranged:    DME Agency:     HH Arranged:    HH Agency:     Status of Service:  In process, will continue to follow  If discussed at Long Length of Stay Meetings, dates discussed:    Additional Comments:  Pollie Friar, RN 09/18/2017, 12:57 PM

## 2017-09-19 LAB — GLUCOSE, CAPILLARY
GLUCOSE-CAPILLARY: 106 mg/dL — AB (ref 70–99)
GLUCOSE-CAPILLARY: 102 mg/dL — AB (ref 70–99)
GLUCOSE-CAPILLARY: 187 mg/dL — AB (ref 70–99)
Glucose-Capillary: 133 mg/dL — ABNORMAL HIGH (ref 70–99)

## 2017-09-19 NOTE — Progress Notes (Signed)
NIF -40 VC: 1.4L  Good pt effort.

## 2017-09-19 NOTE — Progress Notes (Signed)
IVIG infusing  Second dose this evening completed at 2355,  50 cc  dextrose post IVIG transfusion. Pt tolerated well  no reaction.VS remained stable, no fever no c/o voiced. Pt resting  ,RN will continue to monitor.

## 2017-09-19 NOTE — Progress Notes (Signed)
[facility] PROGRESS NOTE  [name] [id number] DOB: 11-16-1955 DOA: 09/17/2017 PCP: Renato Shin, MD   Narrative: 62 year old [place]ian.male Chronic hep B followed by [facility] clinic all Hayashi-suppressed on Entecavir--Screening US from 04/04/2016 showed no HCC , PTSD, malignant thymoma status post XRT resection 2003, seropositive myasthenia (R ACH R+, MUSK unknown)-note that high dosages of steroids caused mood lability-he is on chronic steroids and Mestinon under care of neurology Seen most recently Dr. Scheryl Marten, [facility] 09/11/2017-at that visit noted increase in subjective swallowing's chewing fatigue difficulty swallowing numbness of his chin Maintenance medications prednisone 30, Mestinon 60 3 times daily  Admitted 8/19 with potential flare of myasthenia BC 11 hemoglobin 17 creatinine 1.0 BP 163/106 pulse 109 CT chest without contrast and CXR negative   A & Plan Myasthenia gravis-defer to neurology planning-IVIG X 5/7-0.4G/KG daily, NIF FVC every 4, Mestinon 60 3 times daily-prednisone 15 every other day-patient had episodic weakness this morning and felt very anxious-I would keep him today to ensure that he feels comfortable going home with IVIG--- he tells me that he usually takes prednisone 20 mg daily and feels that the change in steroids from neurology might have contributed to some of this weakness and is asking about if he can go back to prior dose of prednisone 20-if this persists I will speak with neurology regarding the same  Panic reaction-blood pressure was in the 150s/100 at around 1015 this morning he became very anxious after I had seen him earlier this morning and felt weak on the right side-he has no focal deficit however and apart from his weakness because of the myasthenia I do not feel any work-up such as a CT is necessary at this time as this is in keeping with his myasthenia gravis--however monitor him overnight and make some adjustments to his  steroids after discussion with neurology if this persists  DM TY 2-resume metformin XR ID-continue Januvia 100 daily-a1c good 5.9 Blood sugar 106 03/03/2015  Chronic hep B with no evidence of HCC on recent scanning followed at [facility] Entecavir 1 mg daily  PTSD continue Prozac 20 daily  HLD  Insomnia-continue Ambien 10 at bedtime   Lovenox, full code, none, inpatient    Cathaleen Korol, MD  [facility] Direct contact: [phone] --Via amion app OR  --[email or web address]; password [email or web address]  7PM-7AM contact night coverage as above 09/19/2017, 10:29 AM  LOS: 2 days   Consultants:  Neurology  Procedures:  IVIG  Antimicrobials:  None  Interval history/Subjective:  Diaphoretic week after I had seen him 15 minutes prior and explained plan of care and possible discharge He seems visibly anxious He has no chest pain His right eye is drooping in keeping with his prior symptoms of myasthenia He seemed to be reassured after we discussed  Objective:  Vitals:  Vitals:   09/19/17 0417 09/19/17 0907  BP: 111/83 (!) 140/98  Pulse: 91 93  Resp: 18 18  Temp: 98.5 F (36.9 C) 98.6 F (37 C)  SpO2: 97% 100%    Exam:  Does not alert oriented no distress but somewhat anxious Chest is clinically clear S1-S2 no murmur rub or gallop Abdomen is soft nontender no rebound no guarding Power is 5/5 both upper and lower extremities with some mild weakness to plantar dorsiflexion on the right side of the foot however I would still consider it is 5/5 power reflexes are diminished throughout No lower extremity edema   I have personally reviewed the following:   Labs:  No recent labs today  Imaging studies:  no findings on chest x-ray CT head  Medical tests:  n   Test discussed with performing physician:  n  Decision to obtain old records:  n  Review and summation of old records:  n  Scheduled Meds: . calcium carbonate  1,250 mg Oral TID WC  . cholecalciferol   2,000 Units Oral Daily  . colestipol  5 g Oral Daily  . enoxaparin (LOVENOX) injection  40 mg Subcutaneous QHS  . entecavir  1 mg Oral Daily  . FLUoxetine  20 mg Oral Daily  . hydrocortisone cream   Topical QID  . insulin aspart  0-9 Units Subcutaneous TID WC  . pantoprazole  40 mg Oral Daily  . predniSONE  15 mg Oral QODAY  . pyridostigmine  60 mg Oral TID  . sitaGLIPtin  100 mg Oral Daily   Continuous Infusions: . sodium chloride 100 mL/hr at 09/19/17 0035  . Immune Globulin 10% 66 mL/hr at 09/18/17 2252    Principal Problem:   Myasthenia gravis with acute exacerbation ([facility]) Active Problems:   Chronic hepatitis B virus infection ([place])   Diabetes ([place])   HYPERCHOLESTEROLEMIA   LOS: 2 days

## 2017-09-19 NOTE — Progress Notes (Signed)
Pt has received insurance approval through Medicare D for home IVIG. MD updated and [facility] made aware.

## 2017-09-19 NOTE — Progress Notes (Signed)
[facility]  [facility] is prepared for DC to home after IVIG dose on 09-20-17 for home dose on 09-21-17 as ordered by hospital team.  [facility] Infusion Coordinator will follow pt in hospital to support DC to home when ready.  If patient discharges after hours, please call [phone].   Larry Sierras 09/19/2017, 5:40 PM

## 2017-09-20 LAB — GLUCOSE, CAPILLARY
GLUCOSE-CAPILLARY: 133 mg/dL — AB (ref 70–99)
Glucose-Capillary: 106 mg/dL — ABNORMAL HIGH (ref 70–99)
Glucose-Capillary: 99 mg/dL (ref 70–99)
Glucose-Capillary: 162 mg/dL — ABNORMAL HIGH (ref 70–99)

## 2017-09-20 LAB — CBC WITH DIFFERENTIAL/PLATELET
Abs Immature Granulocytes: 0 10*3/uL (ref 0.0–0.1)
Basophils Absolute: 0 10*3/uL (ref 0.0–0.1)
Basophils Relative: 1 %
EOS ABS: 0.1 10*3/uL (ref 0.0–0.7)
EOS PCT: 2 %
HEMATOCRIT: 44.3 % (ref 39.0–52.0)
Hemoglobin: 14.9 g/dL (ref 13.0–17.0)
Immature Granulocytes: 0 %
LYMPHS ABS: 3 10*3/uL (ref 0.7–4.0)
Lymphocytes Relative: 49 %
MCH: 28.9 pg (ref 26.0–34.0)
MCHC: 33.6 g/dL (ref 30.0–36.0)
MCV: 85.9 fL (ref 78.0–100.0)
MONOS PCT: 8 %
Monocytes Absolute: 0.5 10*3/uL (ref 0.1–1.0)
Neutro Abs: 2.4 10*3/uL (ref 1.7–7.7)
Neutrophils Relative %: 40 %
Platelets: 164 10*3/uL (ref 150–400)
RBC: 5.16 MIL/uL (ref 4.22–5.81)
RDW: 12.6 % (ref 11.5–15.5)
WBC: 6 10*3/uL (ref 4.0–10.5)

## 2017-09-20 LAB — BASIC METABOLIC PANEL
Anion gap: 6 (ref 5–15)
BUN: 9 mg/dL (ref 8–23)
CALCIUM: 8.9 mg/dL (ref 8.9–10.3)
CO2: 32 mmol/L (ref 22–32)
CREATININE: 1.01 mg/dL (ref 0.61–1.24)
Chloride: 104 mmol/L (ref 98–111)
GFR calc Af Amer: >60 mL/min (ref >60)
GFR calc non Af Amer: >60 mL/min (ref >60)
GLUCOSE: 103 mg/dL — AB (ref 70–99)
Potassium: 3.8 mmol/L (ref 3.5–5.1)
Sodium: 142 mmol/L (ref 135–145)

## 2017-09-20 MED ORDER — NAPROXEN 250 MG PO TABS
500.0000 mg | ORAL_TABLET | Freq: Two times a day (BID) | ORAL | Status: DC
  Administered 2017-09-20 – 2017-09-22 (×5): 500 mg via ORAL
  Filled 2017-09-20 (×5): qty 2

## 2017-09-20 NOTE — Progress Notes (Signed)
RT did NIF on patient, patient gave 3 good efforts and reached 20.  RT did VC on patient, patient gave 3 good efforts and reached 1.30.

## 2017-09-20 NOTE — Progress Notes (Signed)
Assumed care for patient at 1540

## 2017-09-20 NOTE — Progress Notes (Signed)
Physical Therapy Treatment Patient Details Name: [name] MRN: [id number] DOB: 1955-03-11 Today's Date: 09/20/2017    History of Present Illness Pt is a 62 y/o male admitted secondary to progressive weakness and SOB. Thought to be secondary to Myasthenia Gravis exacerbation. PMH includes DM, Hepatitis B, PTSD, malignant thyoma s/p resection.     PT Comments    Patient progressing well towards PT goals. Tolerated gait training and stair training without any difficulty. Demonstrates slow, cautious gait speed which is purposeful per pt due to being aware of weakness. Scored 20/24 on DGI so not increased risk for falls. Education re: fatigue and activity modifications in hospital and at home. Encouraged mobility while in hospital to improve strength/mobility. Pt does not require skilled therapy services due to improved function and all education completed. Discharge from therapy   Follow Up Recommendations  No PT follow up     Equipment Recommendations  None recommended by PT    Recommendations for Other Services       Precautions / Restrictions Precautions Precautions: None Restrictions Weight Bearing Restrictions: No    Mobility  Bed Mobility               General bed mobility comments: Sitting in chair upon PT arrival.   Transfers Overall transfer level: Needs assistance Equipment used: None Transfers: Sit to/from Stand Sit to Stand: Independent         General transfer comment: No assist needed to stand from chair. no dizziness.   Ambulation/Gait Ambulation/Gait assistance: Modified independent (Device/Increase time) Gait Distance (Feet): 300 Feet Assistive device: None Gait Pattern/deviations: Step-through pattern;Decreased stride length Gait velocity: Decreased    General Gait Details: Very slow, cautious gait. Steady overall. Reports speed is baseline when having flare up.   Stairs Stairs: Yes Stairs assistance: Modified independent  (Device/Increase time) Stair Management: One rail Left Number of Stairs: 3(+ 2 steps x2 bouts.) General stair comments: Use of rail for support, no LOB.   Wheelchair Mobility    Modified Rankin (Stroke Patients Only)       Balance Overall balance assessment: Needs assistance;History of Falls Sitting-balance support: Feet supported;No upper extremity supported Sitting balance-Leahy Scale: Normal     Standing balance support: During functional activity Standing balance-Leahy Scale: Good                   Standardized Balance Assessment Standardized Balance Assessment : Dynamic Gait Index   Dynamic Gait Index Level Surface: Normal Change in Gait Speed: Mild Impairment Gait with Horizontal Head Turns: Normal Gait with Vertical Head Turns: Normal Gait and Pivot Turn: Mild Impairment Step Over Obstacle: Mild Impairment Step Around Obstacles: Normal Steps: Mild Impairment Total Score: 20      Cognition Arousal/Alertness: Awake/alert Behavior During Therapy: WFL for tasks assessed/performed Overall Cognitive Status: Within Functional Limits for tasks assessed                                        Exercises      General Comments        Pertinent Vitals/Pain Pain Assessment: Faces Faces Pain Scale: Hurts little more Pain Location: headache Pain Descriptors / Indicators: Headache Pain Intervention(s): Monitored during session;Patient requesting pain meds-RN notified    Home Living                      Prior Function  PT Goals (current goals can now be found in the care plan section) Progress towards PT goals: Goals met/education completed, patient discharged from PT    Frequency    Min 3X/week      PT Plan Current plan remains appropriate    Co-evaluation              AM-PAC PT "6 Clicks" Daily Activity  Outcome Measure  Difficulty turning over in bed (including adjusting bedclothes, sheets and  blankets)?: None Difficulty moving from lying on back to sitting on the side of the bed? : None Difficulty sitting down on and standing up from a chair with arms (e.g., wheelchair, bedside commode, etc,.)?: None Help needed moving to and from a bed to chair (including a wheelchair)?: None Help needed walking in hospital room?: None Help needed climbing 3-5 steps with a railing? : A Little 6 Click Score: 23    End of Session Equipment Utilized During Treatment: Gait belt Activity Tolerance: Patient tolerated treatment well Patient left: in bed;with call bell/phone within reach Nurse Communication: Mobility status PT Visit Diagnosis: Muscle weakness (generalized) (M62.81);Other abnormalities of gait and mobility (R26.89)     Time: 0413-6438 PT Time Calculation (min) (ACUTE ONLY): 17 min  Charges:  $Neuromuscular Re-education: 8-22 mins                     [place], [place], DPT [phone]     Marguarite Arbour A Estelene Carmack 09/20/2017, 10:05 AM

## 2017-09-20 NOTE — Care Management Important Message (Signed)
Important Message  Patient Details  Name: [name] MRN: [id number] Date of Birth: 10-Mar-1955   Medicare Important Message Given:  Yes    Pa Tennant 09/20/2017, 2:00 PM

## 2017-09-20 NOTE — Progress Notes (Signed)
NIF -24 VC 1.3 L  Patient tolerated well. RT will monitor as needed

## 2017-09-20 NOTE — Progress Notes (Signed)
Pt refusing to have bed alarm on, safety orientation given to patient, pt is A/O x3, steady gait, continues to refuse bed alarm. Arlis Porta

## 2017-09-20 NOTE — Progress Notes (Signed)
[facility] PROGRESS NOTE  [name] QMV:[id number] DOB: May 07, 1955 DOA: 09/17/2017 PCP: Matthew Shin, MD   Narrative: 62 year old [place]ian.male Chronic hep B followed by [facility] transplant clinic all Hayashi-suppressed on Entecavir--Screening US from 04/04/2016 showed no HCC , PTSD, malignant thymoma status post XRT resection 2003, seropositive myasthenia (R ACH R+, MUSK unknown)-note that high dosages of steroids caused mood lability-he is on chronic steroids and Mestinon under care of neurology Seen most recently [name], [facility] 09/11/2017-at that visit noted increase in subjective swallowing's chewing fatigue difficulty swallowing numbness of his chin Maintenance medications prednisone 30, Mestinon 60 3 times daily  Admitted 8/19 with potential flare of myasthenia BC 11 hemoglobin 17 creatinine 1.0 BP 163/106 pulse 109 CT chest without contrast and CXR negative   A & Plan Myasthenia gravis-defer to neurology planning-IVIG X 5/7-0.4G/KG daily, NIF FVC every 4, Mestinon 60 3 times daily-prednisone 15 every other day-patient had episodic weakness 8/21 and wishes to continue IVIG in the hospital see my note I discussed briefly on 8/21 with [name] of neurology episode of weakness and it was [name] opinion that the patient should not have escalation of steroids at this time as it would make it difficult to determine if the IVIG helped versus not-he is available if there are further questions regarding treatment plan  Panic reaction-8/21   DM TY 2-resume metformin XR ID-continue Januvia 100 daily-a1c good 5.9 Blood sugar 99-187  Chronic hep B with no evidence of HCC on recent scanning followed at [facility] Entecavir 1 mg daily  PTSD as he does not wish to take the Prozac we will discontinue the same  HLD  Insomnia-continue Ambien 10 at bedtime   Lovenox, full code, none, inpatient    [name], MD  [facility] Direct contact: [phone] --Via  amion app OR  --[email or web address]; password [email or web address]  7PM-7AM contact night coverage as above 09/20/2017, 2:27 PM  LOS: 3 days   Consultants:  Neurology  Procedures:  IVIG  Antimicrobials:  None  Interval history/Subjective:  Awake alert pleasant no distress seems better still a little weakness on his right eye however otherwise fine  Objective:  Vitals:  Vitals:   09/20/17 1202 09/20/17 1312  BP: (!) 126/97 139/81  Pulse: 93 (!) 105  Resp: 18 20  Temp: 99.1 F (37.3 C) 98.4 F (36.9 C)  SpO2: 97%     Exam:  Pleasant no distress Right eye has a droop Power 5/5 Able to stand without deficit grossly 5/5   I have personally reviewed the following:   Labs:  abnormality on labs his glucose 103  Imaging studies:  no findings on chest x-ray CT head  Medical tests:  n   Test discussed with performing physician:  n  Decision to obtain old records:  n  Review and summation of old records:  n  Scheduled Meds: . calcium carbonate  1,250 mg Oral TID WC  . cholecalciferol  2,000 Units Oral Daily  . colestipol  5 g Oral Daily  . enoxaparin (LOVENOX) injection  40 mg Subcutaneous QHS  . entecavir  1 mg Oral Daily  . FLUoxetine  20 mg Oral Daily  . hydrocortisone cream   Topical QID  . insulin aspart  0-9 Units Subcutaneous TID WC  . naproxen  500 mg Oral BID WC  . pantoprazole  40 mg Oral Daily  . predniSONE  15 mg Oral QODAY  . pyridostigmine  60 mg Oral TID  . sitaGLIPtin  100 mg Oral Daily  Continuous Infusions: . sodium chloride 100 mL/hr at 09/19/17 0035  . Immune Globulin 10% 20 g (09/19/17 2152)    Principal Problem:   Myasthenia gravis with acute exacerbation ([place]) Active Problems:   Chronic hepatitis B virus infection ([place])   Diabetes ([place])   HYPERCHOLESTEROLEMIA   LOS: 3 days

## 2017-09-20 NOTE — Progress Notes (Signed)
NIF-30 VC-1.3L  Patient tolerated well. RT will continue to monitor.

## 2017-09-21 DIAGNOSIS — G7001 Myasthenia gravis with (acute) exacerbation: Principal | ICD-10-CM

## 2017-09-21 LAB — GLUCOSE, CAPILLARY
GLUCOSE-CAPILLARY: 111 mg/dL — AB (ref 70–99)
GLUCOSE-CAPILLARY: 107 mg/dL — AB (ref 70–99)
Glucose-Capillary: 101 mg/dL — ABNORMAL HIGH (ref 70–99)
Glucose-Capillary: 137 mg/dL — ABNORMAL HIGH (ref 70–99)

## 2017-09-21 NOTE — Progress Notes (Signed)
PROGRESS NOTE    [name]  DJM:[id number] DOB: [birth year] DOA: 09/17/2017 PCP: Renato Shin, MD  Brief Narrative:  62 year old [place]ian.male Chronic hep B followed by [facility] clinic all Hayashi-suppressed on Entecavir--Screening US from 04/04/2016 showed no HCC , PTSD, malignant thymoma status post XRT resection 2003, seropositive myasthenia (R ACH R+, MUSK unknown)-note that high dosages of steroids caused mood lability-he is on chronic steroids and Mestinon under care of neurology Seen most recently Dr. Scheryl Marten, [facility] 09/11/2017-at that visit noted increase in subjective swallowing's chewing fatigue difficulty swallowing numbness of his chin Maintenance medications prednisone 30, Mestinon 60 3 times daily  Admitted 8/19 with potential flare of myasthenia BC 11 hemoglobin 17 creatinine 1.0 BP 163/106 pulse 109 CT chest without contrast and CXR negative   Assessment & Plan:   Principal Problem:   Myasthenia gravis with acute exacerbation ([place]) Active Problems:   Chronic hepatitis B virus infection ([place])   Diabetes ([place])   HYPERCHOLESTEROLEMIA   Myasthenia gravis -  - Neurology c/s, appreciate recs - Planning for IVIG x5 days (0.4 g/kg qd) - NIF and FVC q4 hours (these have been consistently low over past several days but pt looks clinically well, will need to discuss with pulmonology) - continuous pulse ox Mestinon 60 mg TID and prednisone 15 mg every other day Pt desired to continue IVIG in hospital  Panic reaction-8/21, improved   T2DM-resume metformin XR ID-continue Januvia 100 daily.  BG's appropriate.  Follow.  Chronic hep B with no evidence of HCC on recent scanning followed at [facility] Entecavir 1 mg daily.  Follow outpatient.  PTSD prozac d/c'd by previous provider due to pt request.  HLD - not on any meds  Insomnia-continue Ambien 10 at bedtime   DVT prophylaxis: lovenox Code Status:full  Family Communication: none at  bedside Disposition Plan: possibly tomorrow   Consultants:   neurology  Procedures:   none  Antimicrobials:  Anti-infectives (From admission, onward)   Start     Dose/Rate Route Frequency Ordered Stop   09/17/17 2100  entecavir (BARACLUDE) tablet 1 mg     1 mg Oral Daily 09/17/17 2054           Subjective: Feeling better. Feels weak still, but better.   Objective: Vitals:   09/21/17 1616 09/21/17 2024 09/21/17 2027 09/21/17 2028  BP: (!) 122/94 (!) 140/100 (!) 130/102 (!) 136/106  Pulse: 93 84 84 90  Resp: 18     Temp: 98.4 F (36.9 C) 97.8 F (36.6 C) 97.8 F (36.6 C) 97.8 F (36.6 C)  TempSrc: Oral Oral Oral Oral  SpO2: 98% 98% 99% 99%  Weight:      Height:        Intake/Output Summary (Last 24 hours) at 09/21/2017 2049 Last data filed at 09/21/2017 1155 Gross per 24 hour  Intake 1358.48 ml  Output 400 ml  Net 958.48 ml   Filed Weights   09/17/17 2212  Weight: 55 kg    Examination:  General exam: Appears calm and comfortable  Respiratory system: Clear to auscultation. Respiratory effort normal. Cardiovascular system: S1 & S2 heard, RRR. No JVD, murmurs, rubs, gallops or clicks. No pedal edema. Gastrointestinal system: Abdomen is nondistended, soft and nontender. No organomegaly or masses felt. Normal bowel sounds heard. Central nervous system: Alert and oriented. No focal neurological deficits. Extremities: Symmetric 5 x 5 power. Skin: No rashes, lesions or ulcers Psychiatry: Judgement and insight appear normal. Mood & affect appropriate.     Data Reviewed: I  have personally reviewed following labs and imaging studies  CBC: Recent Labs  Lab 09/17/17 1108 09/17/17 2147 09/18/17 0529 09/20/17 0413  WBC 9.9 11.7* 10.4 6.0  NEUTROABS 8.3*  --   --  2.4  HGB 16.2 17.2* 14.8 14.9  HCT 48.0 51.0 44.0 44.3  MCV 86.5 87.2 86.4 85.9  PLT 206 206 168 559   Basic Metabolic Panel: Recent Labs  Lab 09/17/17 1108 09/17/17 2147 09/18/17 0529  09/20/17 0413  NA 138  --  141 142  K 4.3  --  3.6 3.8  CL 99  --  102 104  CO2 30  --  30 32  GLUCOSE 281*  --  106* 103*  BUN 9  --  9 9  CREATININE 1.04 1.02 0.96 1.01  CALCIUM 9.6  --  8.7* 8.9   GFR: Estimated Creatinine Clearance: 59.7 mL/min (by C-G formula based on SCr of 1.01 mg/dL). Liver Function Tests: Recent Labs  Lab 09/17/17 1108 09/18/17 0529  AST 23 20  ALT 25 23  ALKPHOS 41 30*  BILITOT 1.0 1.6*  PROT 7.1 6.6  ALBUMIN 4.2 3.5   No results for input(s): LIPASE, AMYLASE in the last 168 hours. No results for input(s): AMMONIA in the last 168 hours. Coagulation Profile: Recent Labs  Lab 09/17/17 1644  INR 0.92   Cardiac Enzymes: No results for input(s): CKTOTAL, CKMB, CKMBINDEX, TROPONINI in the last 168 hours. BNP (last 3 results) No results for input(s): PROBNP in the last 8760 hours. HbA1C: No results for input(s): HGBA1C in the last 72 hours. CBG: Recent Labs  Lab 09/20/17 1649 09/20/17 2107 09/21/17 0610 09/21/17 1054 09/21/17 1636  GLUCAP 133* 162* 101* 111* 137*   Lipid Profile: No results for input(s): CHOL, HDL, LDLCALC, TRIG, CHOLHDL, LDLDIRECT in the last 72 hours. Thyroid Function Tests: No results for input(s): TSH, T4TOTAL, FREET4, T3FREE, THYROIDAB in the last 72 hours. Anemia Panel: No results for input(s): VITAMINB12, FOLATE, FERRITIN, TIBC, IRON, RETICCTPCT in the last 72 hours. Sepsis Labs: No results for input(s): PROCALCITON, LATICACIDVEN in the last 168 hours.  No results found for this or any previous visit (from the past 240 hour(s)).       Radiology Studies: No results found.      Scheduled Meds: . calcium carbonate  1,250 mg Oral TID WC  . cholecalciferol  2,000 Units Oral Daily  . colestipol  5 g Oral Daily  . enoxaparin (LOVENOX) injection  40 mg Subcutaneous QHS  . entecavir  1 mg Oral Daily  . hydrocortisone cream   Topical QID  . insulin aspart  0-9 Units Subcutaneous TID WC  . naproxen  500 mg  Oral BID WC  . pantoprazole  40 mg Oral Daily  . predniSONE  15 mg Oral QODAY  . pyridostigmine  60 mg Oral TID  . sitaGLIPtin  100 mg Oral Daily   Continuous Infusions: . sodium chloride 100 mL/hr at 09/19/17 0035  . Immune Globulin 10% Stopped (09/21/17 0035)     LOS: 4 days    Time spent: over 30 min    Fayrene Helper, MD [facility] Pager [phone]  If 7PM-7AM, please contact night-coverage [email or web address] Password [facility] 09/21/2017, 8:49 PM

## 2017-09-21 NOTE — Progress Notes (Signed)
NIF-30 VC-1.3L  Patient tolerated well. RT will continue to monitor.

## 2017-09-21 NOTE — Progress Notes (Signed)
NIF and VC done.  NIF reading was 18, VC was 2.25.  Patient was sating 99 on room air.  Patient stated that he has periods where breathing is not good, but they come and go.

## 2017-09-21 NOTE — Progress Notes (Signed)
Pt has walked around the whole unit 2x times today. Pt tolerated well. Will continue to monitor.

## 2017-09-22 LAB — BASIC METABOLIC PANEL
ANION GAP: 4 — AB (ref 5–15)
BUN: 14 mg/dL (ref 8–23)
CALCIUM: 8.7 mg/dL — AB (ref 8.9–10.3)
CO2: 30 mmol/L (ref 22–32)
Chloride: 104 mmol/L (ref 98–111)
Creatinine, Ser: 1 mg/dL (ref 0.61–1.24)
GLUCOSE: 103 mg/dL — AB (ref 70–99)
POTASSIUM: 4.2 mmol/L (ref 3.5–5.1)
Sodium: 138 mmol/L (ref 135–145)

## 2017-09-22 LAB — CBC
HCT: 43.2 % (ref 39.0–52.0)
Hemoglobin: 14.9 g/dL (ref 13.0–17.0)
MCH: 29.3 pg (ref 26.0–34.0)
MCHC: 34.5 g/dL (ref 30.0–36.0)
MCV: 84.9 fL (ref 78.0–100.0)
PLATELETS: 130 10*3/uL — AB (ref 150–400)
RBC: 5.09 MIL/uL (ref 4.22–5.81)
RDW: 12.4 % (ref 11.5–15.5)
WBC: 5.5 10*3/uL (ref 4.0–10.5)

## 2017-09-22 LAB — GLUCOSE, CAPILLARY: Glucose-Capillary: 160 mg/dL — ABNORMAL HIGH (ref 70–99)

## 2017-09-22 LAB — MAGNESIUM: Magnesium: 2.4 mg/dL (ref 1.7–2.4)

## 2017-09-22 MED ORDER — PREDNISONE 5 MG PO TABS: 15.0000 mg | ORAL_TABLET | ORAL | 0 refills | Status: DC

## 2017-09-22 NOTE — Care Management (Signed)
Spoke with Dr. Florene Glen regarding dc plan.  Patient completed IVIG infusions and no longer needs HH.  HH order d/c'd.

## 2017-09-22 NOTE — Discharge Summary (Signed)
Physician Discharge Summary  [name] KNL:[id number] DOB: 1955-07-15 DOA: 09/17/2017  PCP: Renato Shin, MD  Admit date: 09/17/2017 Discharge date: 09/22/2017  Time spent: 35 minutes  Recommendations for Outpatient Follow-up:  1. Follow up outpatient CBC/CMP 2. Follow up with neurology as planned (early September) 3. Follow up with PCP    Discharge Diagnoses:  Principal Problem:   Myasthenia gravis with acute exacerbation ([place]) Active Problems:   Chronic hepatitis B virus infection ([place])   Diabetes ([place])   HYPERCHOLESTEROLEMIA   Discharge Condition: stable  Diet recommendation: heart healthy  Filed Weights   09/17/17 2212  Weight: 55 kg    History of present illness:  Per previous PN 62 year old [place]ian.male Chronic hep B followed by [facility] transplant clinic all Hayashi-suppressed on Entecavir--Screening US from 04/04/2016 showed no HCC , PTSD, malignant thymoma status post XRT resection 2003, seropositive myasthenia (R ACH R+, MUSK unknown)-note that high dosages of steroids caused mood lability-he is on chronic steroids and Mestinon under care of neurology Seen most recently Dr. Scheryl Marten, [facility] 09/11/2017-at that visit noted increase in subjective swallowing's chewing fatigue difficulty swallowing numbness of his chin Maintenance medications prednisone 30, Mestinon 60 3 times daily  Admitted 8/19 with potential flare of myasthenia BC 11 hemoglobin 17 creatinine 1.0 BP 163/106 pulse 109 CT chest without contrast and CXR negative  He was admitted for concern for myasthenia flare.  He was seen by neurology who recommended starting IVIG.  Pt now s/p 5 days IVIG with improved symptoms.   Hospital Course:  Myasthenia gravis -  - Neurology c/s, appreciate recs (of note, they'd recommended swallow eval, but this wasn't done and today pt noted his swallowing felt normal) - S/p IVIG x5 days (0.4 g/kg qd)  - NIF and vital capacity had been relatively low throughout his  admission, but when I saw him 8/23 he looked well.  Today his most recent NIF still low, but vital capacity has improved and he denies any respiratory distress. Mestinon 60 mg TID and prednisone 15 mg every other day  T2DM-resume metformin XR ID-continue Januvia 100 daily.  BG's appropriate.  Follow.  Chronic hep B with no evidence of HCC on recent scanning followed at [facility] Entecavir 1 mg daily.  Follow outpatient.  PTSDfollow up pt desire to continue prozac as outpatinet  HLD - not on any meds  Insomnia-continue Ambien 10 at bedtime   Procedures:  none  Consultations:  neurology  Discharge Exam: Vitals:   09/22/17 0334 09/22/17 0843  BP: (!) 127/93 (!) 136/93  Pulse: 79 89  Resp: 17 18  Temp: 98.2 F (36.8 C) 98.5 F (36.9 C)  SpO2: 97% 95%   Feeling well.  Wants to go home. Denies resp complaints.  General: No acute distress. Cardiovascular: Heart sounds show a regular rate, and rhythm. No gallops or rubs. No murmurs. No JVD. Lungs: Clear to auscultation bilaterally with good air movement. No rales, rhonchi or wheezes. Abdomen: Soft, nontender, nondistended with normal active bowel sounds. No masses. No hepatosplenomegaly. Neurological: Alert and oriented 3. Moves all extremities 4 with equal strength. Cranial nerves II through XII grossly intact. Skin: Warm and dry. No rashes or lesions. Extremities: No clubbing or cyanosis. No edema.  Psychiatric: Mood and affect are normal. Insight and judgment are appropriate.  Discharge Instructions   Discharge Instructions    Call MD for:  difficulty breathing, headache or visual disturbances   Complete by:  As directed    Call MD for:  extreme fatigue  Complete by:  As directed    Call MD for:  hives   Complete by:  As directed    Call MD for:  persistant dizziness or light-headedness   Complete by:  As directed    Call MD for:  persistant nausea and vomiting   Complete by:  As directed    Call  MD for:  redness, tenderness, or signs of infection (pain, swelling, redness, odor or green/yellow discharge around incision site)   Complete by:  As directed    Call MD for:  severe uncontrolled pain   Complete by:  As directed    Call MD for:  temperature >100.4   Complete by:  As directed    Diet - low sodium heart healthy   Complete by:  As directed    Diet - low sodium heart healthy   Complete by:  As directed    Discharge instructions   Complete by:  As directed    You were seen for your myasthenia gravis and were treated with 5 days of IVIG.  Please continue your steroids and mestinon.   Please follow up with your neurologist at [facility] as scheduled.  Please follow up with your PCP in a few days.   Return for new, recurrent, or worsening symptoms.  Please ask your PCP to request records from this hospitalization so they know what was done and what the next steps will be.   Increase activity slowly   Complete by:  As directed    Increase activity slowly   Complete by:  As directed      Allergies as of 09/22/2017      Reactions   Azithromycin Other (See Comments)   REACTION: exac of his mg   Beta Adrenergic Blockers Other (See Comments)   REACTION: exac of mg      Medication List    STOP taking these medications   glucose blood test strip   onetouch ultrasoft lancets     TAKE these medications   calcium carbonate 600 MG Tabs tablet Commonly known as:  OS-CAL Take 600 mg by mouth 3 (three) times daily with meals.   colestipol 1 g tablet Commonly known as:  COLESTID TAKE FIVE TABLETS BY MOUTH DAILY What changed:    how much to take  how to take this  when to take this  additional instructions   desonide 0.05 % gel Commonly known as:  DESONATE Apply 1 application topically 2 (two) times daily as needed.   ELIDEL 1 % cream Generic drug:  pimecrolimus Apply 1 application topically 2 (two) times daily as needed.   entecavir 1 MG tablet Commonly known  as:  BARACLUDE Take 1 tablet (1 mg total) by mouth daily.   fluocinonide ointment 0.05 % Commonly known as:  LIDEX Apply 1 application topically 2 (two) times daily as needed.   FLUoxetine 20 MG capsule Commonly known as:  PROZAC Take 1 capsule (20 mg total) by mouth daily.   ketoconazole 2 % cream Commonly known as:  NIZORAL Apply 1 application topically 2 (two) times daily as needed for irritation.   metFORMIN 500 MG 24 hr tablet Commonly known as:  GLUCOPHAGE-XR TAKE TWO TABLETS BY MOUTH TWICE A DAY   omeprazole 20 MG capsule Commonly known as:  PRILOSEC TAKE ONE CAPSULE BY MOUTH DAILY   predniSONE 5 MG tablet Commonly known as:  DELTASONE Take 3 tablets (15 mg total) by mouth every other day. What changed:    medication strength  how much to take  when to take this  Another medication with the same name was removed. Continue taking this medication, and follow the directions you see here.   pyridostigmine 60 MG tablet Commonly known as:  MESTINON Take 60 mg by mouth 3 (three) times daily.   sitaGLIPtin 100 MG tablet Commonly known as:  JANUVIA Take 1 tablet (100 mg total) by mouth daily.   triamcinolone cream 0.1 % Commonly known as:  KENALOG Apply 1 application topically 2 (two) times daily as needed.   Vitamin D 2000 units tablet Take 2,000 Units by mouth daily.   zolpidem 10 MG tablet Commonly known as:  AMBIEN Take 1 tablet (10 mg total) by mouth at bedtime as needed.      Allergies  Allergen Reactions  . Azithromycin Other (See Comments)    REACTION: exac of his mg  . Beta Adrenergic Blockers Other (See Comments)    REACTION: exac of mg      The results of significant diagnostics from this hospitalization (including imaging, microbiology, ancillary and laboratory) are listed below for reference.    Significant Diagnostic Studies: Dg Chest 2 View  Result Date: 09/17/2017 CLINICAL DATA:  Patient with worsening shortness of breath. EXAM:  CHEST - 2 VIEW COMPARISON:  Chest radiograph 12/02/2007 FINDINGS: Normal cardiac and mediastinal contours. Patient status post median sternotomy. No consolidative pulmonary opacities. No pleural effusion or pneumothorax. Regional skeleton unremarkable. IMPRESSION: No acute cardiopulmonary process. Electronically Signed   By: Lovey Newcomer M.D.   On: 09/17/2017 17:18   Ct Head Wo Contrast  Result Date: 09/17/2017 CLINICAL DATA:  Neuro deficit, subacute. History of myasthenia gravis. Intermittent numbness to chin and heel. EXAM: CT HEAD WITHOUT CONTRAST TECHNIQUE: Contiguous axial images were obtained from the base of the skull through the vertex without intravenous contrast. COMPARISON:  Head CT dated 11/30/2008. FINDINGS: Brain: Ventricles are normal in size and configuration. All areas of the brain demonstrate appropriate gray-white matter differentiation. There is no mass, hemorrhage, edema or other evidence of acute parenchymal abnormality. No extra-axial hemorrhage. Vascular: Chronic calcified atherosclerotic changes of the large vessels at the skull base. No unexpected hyperdense vessel. Skull: Normal. Negative for fracture or focal lesion. Sinuses/Orbits: No acute finding. Other: None. IMPRESSION: Negative head CT.  No intracranial mass, hemorrhage or edema. Electronically Signed   By: Franki Cabot M.D.   On: 09/17/2017 20:37    Microbiology: No results found for this or any previous visit (from the past 240 hour(s)).   Labs: Basic Metabolic Panel: Recent Labs  Lab 09/17/17 1108 09/17/17 2147 09/18/17 0529 09/20/17 0413 09/22/17 0434  NA 138  --  141 142 138  K 4.3  --  3.6 3.8 4.2  CL 99  --  102 104 104  CO2 30  --  30 32 30  GLUCOSE 281*  --  106* 103* 103*  BUN 9  --  9 9 14   CREATININE 1.04 1.02 0.96 1.01 1.00  CALCIUM 9.6  --  8.7* 8.9 8.7*  MG  --   --   --   --  2.4   Liver Function Tests: Recent Labs  Lab 09/17/17 1108 09/18/17 0529  AST 23 20  ALT 25 23  ALKPHOS 41  30*  BILITOT 1.0 1.6*  PROT 7.1 6.6  ALBUMIN 4.2 3.5   No results for input(s): LIPASE, AMYLASE in the last 168 hours. No results for input(s): AMMONIA in the last 168 hours. CBC: Recent Labs  Lab 09/17/17  1108 09/17/17 2147 09/18/17 0529 09/20/17 0413 09/22/17 0434  WBC 9.9 11.7* 10.4 6.0 5.5  NEUTROABS 8.3*  --   --  2.4  --   HGB 16.2 17.2* 14.8 14.9 14.9  HCT 48.0 51.0 44.0 44.3 43.2  MCV 86.5 87.2 86.4 85.9 84.9  PLT 206 206 168 164 130*   Cardiac Enzymes: No results for input(s): CKTOTAL, CKMB, CKMBINDEX, TROPONINI in the last 168 hours. BNP: BNP (last 3 results) Recent Labs    09/17/17 1644  BNP 11.4    ProBNP (last 3 results) No results for input(s): PROBNP in the last 8760 hours.  CBG: Recent Labs  Lab 09/21/17 0610 09/21/17 1054 09/21/17 1636 09/21/17 2121 09/22/17 0625  GLUCAP 101* 111* 137* 107* 160*       Signed:  Fayrene Helper MD.  [facility] 09/22/2017, 9:47 AM

## 2017-09-22 NOTE — Progress Notes (Signed)
RT note: NIF and VC performed with patient with good effort.  NIF of -30 and VC of 2.4L.

## 2017-09-22 NOTE — Progress Notes (Signed)
NURSING PROGRESS NOTE  [name] [id number] Discharge Data: 09/22/2017 12:27 PM Attending Provider: Elodia Florence., * TSV:[id number], Hilliard Clark, MD     [place] to be D/C'd Home per MD order.  Discussed with the patient the After Visit Summary and all questions fully answered. All IV's discontinued with no bleeding noted. All belongings returned to patient for patient to take home.   Last Vital Signs:  Blood pressure (!) 136/93, pulse 89, temperature 98.5 F (36.9 C), temperature source Oral, resp. rate 18, height 5\' 5"  (1.651 m), weight 55 kg, SpO2 95 %.  Discharge Medication List Allergies as of 09/22/2017      Reactions   Azithromycin Other (See Comments)   REACTION: exac of his mg   Beta Adrenergic Blockers Other (See Comments)   REACTION: exac of mg      Medication List    STOP taking these medications   glucose blood test strip   onetouch ultrasoft lancets     TAKE these medications   calcium carbonate 600 MG Tabs tablet Commonly known as:  OS-CAL Take 600 mg by mouth 3 (three) times daily with meals.   colestipol 1 g tablet Commonly known as:  COLESTID TAKE FIVE TABLETS BY MOUTH DAILY What changed:    how much to take  how to take this  when to take this  additional instructions   desonide 0.05 % gel Commonly known as:  DESONATE Apply 1 application topically 2 (two) times daily as needed.   ELIDEL 1 % cream Generic drug:  pimecrolimus Apply 1 application topically 2 (two) times daily as needed.   entecavir 1 MG tablet Commonly known as:  BARACLUDE Take 1 tablet (1 mg total) by mouth daily.   fluocinonide ointment 0.05 % Commonly known as:  LIDEX Apply 1 application topically 2 (two) times daily as needed.   FLUoxetine 20 MG capsule Commonly known as:  PROZAC Take 1 capsule (20 mg total) by mouth daily.   ketoconazole 2 % cream Commonly known as:  NIZORAL Apply 1 application topically 2 (two) times daily as needed for irritation.    metFORMIN 500 MG 24 hr tablet Commonly known as:  GLUCOPHAGE-XR TAKE TWO TABLETS BY MOUTH TWICE A DAY   omeprazole 20 MG capsule Commonly known as:  PRILOSEC TAKE ONE CAPSULE BY MOUTH DAILY   predniSONE 5 MG tablet Commonly known as:  DELTASONE Take 3 tablets (15 mg total) by mouth every other day. What changed:    medication strength  how much to take  when to take this  Another medication with the same name was removed. Continue taking this medication, and follow the directions you see here.   pyridostigmine 60 MG tablet Commonly known as:  MESTINON Take 60 mg by mouth 3 (three) times daily.   sitaGLIPtin 100 MG tablet Commonly known as:  JANUVIA Take 1 tablet (100 mg total) by mouth daily.   triamcinolone cream 0.1 % Commonly known as:  KENALOG Apply 1 application topically 2 (two) times daily as needed.   Vitamin D 2000 units tablet Take 2,000 Units by mouth daily.   zolpidem 10 MG tablet Commonly known as:  AMBIEN Take 1 tablet (10 mg total) by mouth at bedtime as needed.

## 2017-09-26 ENCOUNTER — Encounter: Payer: Self-pay | Admitting: Endocrinology

## 2017-09-26 ENCOUNTER — Ambulatory Visit (INDEPENDENT_AMBULATORY_CARE_PROVIDER_SITE_OTHER): Payer: Medicare Other | Admitting: Endocrinology

## 2017-09-26 VITALS — BP 126/78 | HR 132 | Ht 65.0 in | Wt 123.0 lb

## 2017-09-26 DIAGNOSIS — B181 Chronic viral hepatitis B without delta-agent: Secondary | ICD-10-CM | POA: Diagnosis not present

## 2017-09-26 DIAGNOSIS — R000 Tachycardia, unspecified: Secondary | ICD-10-CM | POA: Diagnosis not present

## 2017-09-26 NOTE — Patient Instructions (Addendum)
Please see your MG specialist next week as scheduled.  Please continue the same medications.  Please come back for a follow-up appointment in 3-4 months, to follow up diabetes.

## 2017-09-26 NOTE — Progress Notes (Signed)
Subjective:    Patient ID: [name], male    DOB: 08/22/1955, 62 y.o.   MRN: [id number]  HPI Pt was recently in the hospital after IVIG rx for MG.  Overall, pt says he feels better in general.   Past Medical History:  Diagnosis Date  . Anxiety   . Colon polyp   . Depressive disorder, not elsewhere classified   . External hemorrhoid   . Insomnia, unspecified   . Internal hemorrhoids   . Myasthenia gravis without exacerbation ([place])   . Other and unspecified hyperlipidemia   . 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 Type II or unspecified type diabetes mellitus without mention of complication, not stated as uncontrolled   . Viral hepatitis B without mention of hepatic coma, chronic, without mention of hepatitis delta     Past Surgical History:  Procedure Laterality Date  . THYMECTOMY      Social History   Socioeconomic History  . Marital status: Married    Spouse name: Not on file  . Number of children: Not on file  . Years of education: Not on file  . Highest education level: Not on file  Occupational History  . Not on file  Social Needs  . Financial resource strain: Not on file  . Food insecurity:    Worry: Not on file    Inability: Not on file  . Transportation needs:    Medical: Not on file    Non-medical: Not on file  Tobacco Use  . Smoking status: Never Smoker  . Smokeless tobacco: Never Used  Substance and Sexual Activity  . Alcohol use: No  . Drug use: No  . Sexual activity: Not on file  Lifestyle  . Physical activity:    Days per week: Not on file    Minutes per session: Not on file  . Stress: Not on file  Relationships  . Social connections:    Talks on phone: Not on file    Gets together: Not on file    Attends religious service: Not on file    Active member of club or organization: Not on file    Attends meetings of clubs or organizations: Not on file    Relationship status: Not on file  . Intimate partner violence:   Fear of current or ex partner: Not on file    Emotionally abused: Not on file    Physically abused: Not on file    Forced sexual activity: Not on file  Other Topics Concern  . Not on file  Social History Narrative  . Not on file    Current Outpatient Medications on File Prior to Visit  Medication Sig Dispense Refill  . calcium carbonate (OS-CAL) 600 MG TABS Take 600 mg by mouth 3 (three) times daily with meals.      . Cholecalciferol (VITAMIN D) 2000 UNITS tablet Take 2,000 Units by mouth daily.      . colestipol (COLESTID) 1 g tablet TAKE FIVE TABLETS BY MOUTH DAILY (Patient taking differently: Take 5 g by mouth daily. ) 450 tablet 2  . desonide (DESONATE) 0.05 % gel Apply 1 application topically 2 (two) times daily as needed.     . entecavir (BARACLUDE) 1 MG tablet Take 1 tablet (1 mg total) by mouth daily. 30 tablet 6  . fluocinonide ointment (LIDEX) 1.96 % Apply 1 application topically 2 (two) times daily as needed.     Marland Kitchen FLUoxetine (PROZAC) 20 MG capsule Take 1  capsule (20 mg total) by mouth daily. 90 capsule 0  . ketoconazole (NIZORAL) 2 % cream Apply 1 application topically 2 (two) times daily as needed for irritation.     . metFORMIN (GLUCOPHAGE-XR) 500 MG 24 hr tablet TAKE TWO TABLETS BY MOUTH TWICE A DAY (Patient taking differently: Take 1,000 mg by mouth 2 (two) times daily. ) 360 tablet 0  . omeprazole (PRILOSEC) 20 MG capsule TAKE ONE CAPSULE BY MOUTH DAILY (Patient taking differently: Take 20 mg by mouth daily. ) 30 capsule 0  . pimecrolimus (ELIDEL) 1 % cream Apply 1 application topically 2 (two) times daily as needed.     . predniSONE (DELTASONE) 5 MG tablet Take 3 tablets (15 mg total) by mouth every other day. 30 tablet 0  . pyridostigmine (MESTINON) 60 MG tablet Take 60 mg by mouth 3 (three) times daily.     . sitaGLIPtin (JANUVIA) 100 MG tablet Take 1 tablet (100 mg total) by mouth daily. 30 tablet 11  . triamcinolone cream (KENALOG) 0.1 % Apply 1 application topically 2  (two) times daily as needed.     . zolpidem (AMBIEN) 10 MG tablet Take 1 tablet (10 mg total) by mouth at bedtime as needed. 30 tablet 4   No current facility-administered medications on file prior to visit.     Allergies  Allergen Reactions  . Azithromycin Other (See Comments)    REACTION: exac of his mg  . Beta Adrenergic Blockers Other (See Comments)    REACTION: exac of mg    No family history on file.  BP 126/78 (BP Location: Left Arm, Patient Position: Sitting, Cuff Size: Normal)   Pulse (!) 132   Ht 5\' 5"  (1.651 m)   Wt 123 lb (55.8 kg)   SpO2 97%   BMI 20.47 kg/m    Review of Systems Denies fever    Objective:   Physical Exam VITAL SIGNS:  See vs page GENERAL: no distress Pulses: dorsalis pedis intact bilat.   MSK: no deformity of the feet CV: no leg edema Skin:  no ulcer on the feet.  normal color and temp on the feet. Neuro: sensation is intact to touch on the feet.     Lab Results  Component Value Date   TSH 0.878 09/17/2017   Lab Results  Component Value Date   WBC 5.5 09/22/2017   HGB 14.9 09/22/2017   HCT 43.2 09/22/2017   MCV 84.9 09/22/2017   PLT 130 (L) 09/22/2017   Lab Results  Component Value Date   CREATININE 1.00 09/22/2017   BUN 14 09/22/2017   NA 138 09/22/2017   K 4.2 09/22/2017   CL 104 09/22/2017   CO2 30 09/22/2017   Lab Results  Component Value Date   HGBA1C 5.9 (H) 09/17/2017       Assessment & Plan:  MG: improved Type 2 DM: well-controlled  Patient Instructions  Please see your MG specialist next week as scheduled.  Please continue the same medications.  Please come back for a follow-up appointment in 3-4 months, to follow up diabetes.

## 2017-10-02 DIAGNOSIS — Z79899 Other long term (current) drug therapy: Secondary | ICD-10-CM | POA: Diagnosis not present

## 2017-10-02 DIAGNOSIS — G7000 Myasthenia gravis without (acute) exacerbation: Secondary | ICD-10-CM | POA: Diagnosis not present

## 2017-10-04 ENCOUNTER — Other Ambulatory Visit: Payer: Self-pay | Admitting: Endocrinology

## 2017-10-29 ENCOUNTER — Other Ambulatory Visit: Payer: Self-pay | Admitting: Endocrinology

## 2017-10-30 ENCOUNTER — Other Ambulatory Visit: Payer: Self-pay | Admitting: Endocrinology

## 2017-11-25 ENCOUNTER — Other Ambulatory Visit: Payer: Self-pay | Admitting: Endocrinology

## 2017-12-05 DIAGNOSIS — Z79899 Other long term (current) drug therapy: Secondary | ICD-10-CM | POA: Diagnosis not present

## 2017-12-05 DIAGNOSIS — G7000 Myasthenia gravis without (acute) exacerbation: Secondary | ICD-10-CM | POA: Diagnosis not present

## 2017-12-23 ENCOUNTER — Other Ambulatory Visit: Payer: Self-pay | Admitting: Endocrinology

## 2017-12-23 NOTE — Telephone Encounter (Signed)
Metformin: please refill prn  Omeprazole: Please refill x 3 months Further refills would have to be considered by new PCP

## 2017-12-28 ENCOUNTER — Other Ambulatory Visit: Payer: Self-pay | Admitting: Endocrinology

## 2017-12-31 ENCOUNTER — Ambulatory Visit (INDEPENDENT_AMBULATORY_CARE_PROVIDER_SITE_OTHER): Payer: Medicare Other | Admitting: Endocrinology

## 2017-12-31 ENCOUNTER — Encounter: Payer: Self-pay | Admitting: Endocrinology

## 2017-12-31 VITALS — BP 126/78 | HR 89 | Ht 65.0 in | Wt 127.0 lb

## 2017-12-31 DIAGNOSIS — E119 Type 2 diabetes mellitus without complications: Secondary | ICD-10-CM | POA: Diagnosis not present

## 2017-12-31 LAB — POCT GLYCOSYLATED HEMOGLOBIN (HGB A1C): HEMOGLOBIN A1C: 5.5 % (ref 4.0–5.6)

## 2017-12-31 MED ORDER — GLUCOSE BLOOD VI STRP: 1.0000 | ORAL_STRIP | Freq: Every day | 3 refills | Status: DC

## 2017-12-31 NOTE — Progress Notes (Signed)
Subjective:    Patient ID: [name], male    DOB: [birth year], 62 y.o.   MRN: [id number]  HPI Pt returns for f/u of diabetes mellitus: DM type: 2, but he is presumed to be evolving type 1, given lean body habitus, MG, psoriasis, and neg FHx of DM.   Dx'ed: 7371 Complications: none Therapy: 3 oral meds DKA: never Severe hypoglycemia: never.  Pancreatitis: never.  Other: he has never been on insulin.  Interval history: He says cbg's vary from 78-200's.  pt states he feels well in general.   Past Medical History:  Diagnosis Date  . Anxiety   . Colon polyp   . Depressive disorder, not elsewhere classified   . External hemorrhoid   . Insomnia, unspecified   . Internal hemorrhoids   . Myasthenia gravis without exacerbation ([place])   . Other and unspecified hyperlipidemia   . 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 Type II or unspecified type diabetes mellitus without mention of complication, not stated as uncontrolled   . Viral hepatitis B without mention of hepatic coma, chronic, without mention of hepatitis delta     Past Surgical History:  Procedure Laterality Date  . THYMECTOMY      Social History   Socioeconomic History  . Marital status: Married    Spouse name: Not on file  . Number of children: Not on file  . Years of education: Not on file  . Highest education level: Not on file  Occupational History  . Not on file  Social Needs  . Financial resource strain: Not on file  . Food insecurity:    Worry: Not on file    Inability: Not on file  . Transportation needs:    Medical: Not on file    Non-medical: Not on file  Tobacco Use  . Smoking status: Never Smoker  . Smokeless tobacco: Never Used  Substance and Sexual Activity  . Alcohol use: No  . Drug use: No  . Sexual activity: Not on file  Lifestyle  . Physical activity:    Days per week: Not on file    Minutes per session: Not on file  . Stress: Not on file  Relationships  . Social  connections:    Talks on phone: Not on file    Gets together: Not on file    Attends religious service: Not on file    Active member of club or organization: Not on file    Attends meetings of clubs or organizations: Not on file    Relationship status: Not on file  . Intimate partner violence:    Fear of current or ex partner: Not on file    Emotionally abused: Not on file    Physically abused: Not on file    Forced sexual activity: Not on file  Other Topics Concern  . Not on file  Social History Narrative  . Not on file    Current Outpatient Medications on File Prior to Visit  Medication Sig Dispense Refill  . calcium carbonate (OS-CAL) 600 MG TABS Take 600 mg by mouth 3 (three) times daily with meals.      . Cholecalciferol (VITAMIN D) 2000 UNITS tablet Take 2,000 Units by mouth daily.      . colestipol (COLESTID) 1 g tablet TAKE FIVE TABLETS BY MOUTH DAILY (Patient taking differently: Take 5 g by mouth daily. ) 450 tablet 2  . desonide (DESONATE) 0.05 % gel Apply 1 application topically 2 (two)  times daily as needed.     . entecavir (BARACLUDE) 1 MG tablet Take 1 tablet (1 mg total) by mouth daily. 30 tablet 6  . fluocinonide ointment (LIDEX) 5.99 % Apply 1 application topically 2 (two) times daily as needed.     Marland Kitchen FLUoxetine (PROZAC) 20 MG capsule Take 1 capsule (20 mg total) by mouth daily. 90 capsule 0  . JANUVIA 100 MG tablet TAKE ONE TABLET BY MOUTH DAILY 90 tablet 10  . ketoconazole (NIZORAL) 2 % cream Apply 1 application topically 2 (two) times daily as needed for irritation.     . metFORMIN (GLUCOPHAGE-XR) 500 MG 24 hr tablet TAKE TWO TABLETS BY MOUTH TWICE A DAY 360 tablet 0  . omeprazole (PRILOSEC) 20 MG capsule TAKE ONE CAPSULE BY MOUTH DAILY 90 capsule 0  . pimecrolimus (ELIDEL) 1 % cream Apply 1 application topically 2 (two) times daily as needed.     . predniSONE (DELTASONE) 5 MG tablet Take 3 tablets (15 mg total) by mouth every other day. 30 tablet 0  .  pyridostigmine (MESTINON) 60 MG tablet Take 60 mg by mouth 3 (three) times daily.     Marland Kitchen triamcinolone cream (KENALOG) 0.1 % Apply 1 application topically 2 (two) times daily as needed.     . zolpidem (AMBIEN) 10 MG tablet Take 1 tablet (10 mg total) by mouth at bedtime as needed. 30 tablet 4   No current facility-administered medications on file prior to visit.     Allergies  Allergen Reactions  . Azithromycin Other (See Comments)    REACTION: exac of his mg  . Beta Adrenergic Blockers Other (See Comments)    REACTION: exac of mg    No family history on file.  BP 126/78 (BP Location: Right Arm, Patient Position: Sitting, Cuff Size: Normal)   Pulse 89   Ht 5\' 5"  (1.651 m)   Wt 127 lb (57.6 kg)   SpO2 98%   BMI 21.13 kg/m   Review of Systems He denies hypoglycemia    Objective:   Physical Exam VITAL SIGNS:  See vs page GENERAL: no distress Pulses: foot pulses are intact bilaterally.   MSK: no deformity of the feet or ankles.  CV: no edema of the legs or ankles Skin:  no ulcer on the feet or ankles.  normal color and temp on the feet and ankles Neuro: sensation is intact to touch on the feet and ankles.      Lab Results  Component Value Date   HGBA1C 5.5 12/31/2017       Assessment & Plan:  Type 2 DM: well-controlled.  MG: this suggests he will evolve type 1.   Patient Instructions  Please continue the same medications. check your blood sugar once a day.  vary the time of day when you check, between before the 3 meals, and at bedtime.  also check if you have symptoms of your blood sugar being too high or too low.  please keep a record of the readings and bring it to your next appointment here (or you can bring the meter itself).  You can write it on any piece of paper.  please call us sooner if your blood sugar goes below 70, or if you have a lot of readings over 200. Please come back for a follow-up appointment in 3 months

## 2017-12-31 NOTE — Patient Instructions (Signed)
Please continue the same medications.    check your blood sugar once a day.  vary the time of day when you check, between before the 3 meals, and at bedtime.  also check if you have symptoms of your blood sugar being too high or too low.  please keep a record of the readings and bring it to your next appointment here (or you can bring the meter itself).  You can write it on any piece of paper.  please call us sooner if your blood sugar goes below 70, or if you have a lot of readings over 200.  Please come back for a follow-up appointment in 3 months.    

## 2018-01-13 ENCOUNTER — Other Ambulatory Visit: Payer: Self-pay | Admitting: Endocrinology

## 2018-01-14 NOTE — Telephone Encounter (Signed)
Please refill if appropriate

## 2018-01-14 NOTE — Telephone Encounter (Signed)
Please refill x 3 months Further refills would have to be considered by new PCP   

## 2018-01-16 DIAGNOSIS — G7000 Myasthenia gravis without (acute) exacerbation: Secondary | ICD-10-CM | POA: Diagnosis not present

## 2018-01-16 DIAGNOSIS — G7001 Myasthenia gravis with (acute) exacerbation: Secondary | ICD-10-CM | POA: Diagnosis not present

## 2018-01-16 DIAGNOSIS — Z79899 Other long term (current) drug therapy: Secondary | ICD-10-CM | POA: Diagnosis not present

## 2018-01-18 DIAGNOSIS — G7001 Myasthenia gravis with (acute) exacerbation: Secondary | ICD-10-CM | POA: Diagnosis not present

## 2018-01-21 DIAGNOSIS — G7000 Myasthenia gravis without (acute) exacerbation: Secondary | ICD-10-CM | POA: Diagnosis not present

## 2018-01-21 DIAGNOSIS — Z7689 Persons encountering health services in other specified circumstances: Secondary | ICD-10-CM | POA: Diagnosis not present

## 2018-01-21 DIAGNOSIS — I959 Hypotension, unspecified: Secondary | ICD-10-CM | POA: Diagnosis not present

## 2018-01-21 DIAGNOSIS — G7001 Myasthenia gravis with (acute) exacerbation: Secondary | ICD-10-CM | POA: Diagnosis not present

## 2018-01-22 DIAGNOSIS — Z7689 Persons encountering health services in other specified circumstances: Secondary | ICD-10-CM | POA: Diagnosis not present

## 2018-01-22 DIAGNOSIS — I959 Hypotension, unspecified: Secondary | ICD-10-CM | POA: Diagnosis not present

## 2018-01-22 DIAGNOSIS — G7000 Myasthenia gravis without (acute) exacerbation: Secondary | ICD-10-CM | POA: Diagnosis not present

## 2018-01-22 DIAGNOSIS — G7001 Myasthenia gravis with (acute) exacerbation: Secondary | ICD-10-CM | POA: Diagnosis not present

## 2018-01-25 DIAGNOSIS — G7000 Myasthenia gravis without (acute) exacerbation: Secondary | ICD-10-CM | POA: Diagnosis not present

## 2018-01-28 DIAGNOSIS — G7000 Myasthenia gravis without (acute) exacerbation: Secondary | ICD-10-CM | POA: Diagnosis not present

## 2018-01-31 ENCOUNTER — Other Ambulatory Visit: Payer: Self-pay | Admitting: Endocrinology

## 2018-02-19 ENCOUNTER — Ambulatory Visit (INDEPENDENT_AMBULATORY_CARE_PROVIDER_SITE_OTHER): Payer: Medicare Other | Admitting: Family Medicine

## 2018-02-19 ENCOUNTER — Encounter: Payer: Self-pay | Admitting: Family Medicine

## 2018-02-19 VITALS — BP 120/86 | Temp 98.3°F | Resp 12 | Wt 123.0 lb

## 2018-02-19 DIAGNOSIS — E7800 Pure hypercholesterolemia, unspecified: Secondary | ICD-10-CM | POA: Diagnosis not present

## 2018-02-19 DIAGNOSIS — L409 Psoriasis, unspecified: Secondary | ICD-10-CM

## 2018-02-19 DIAGNOSIS — F3341 Major depressive disorder, recurrent, in partial remission: Secondary | ICD-10-CM | POA: Diagnosis not present

## 2018-02-19 DIAGNOSIS — G4700 Insomnia, unspecified: Secondary | ICD-10-CM | POA: Diagnosis not present

## 2018-02-19 DIAGNOSIS — Z23 Encounter for immunization: Secondary | ICD-10-CM | POA: Diagnosis not present

## 2018-02-19 DIAGNOSIS — F419 Anxiety disorder, unspecified: Secondary | ICD-10-CM

## 2018-02-19 MED ORDER — FLUOCINONIDE 0.05 % EX OINT: 1.0000 "application " | TOPICAL_OINTMENT | Freq: Two times a day (BID) | CUTANEOUS | 1 refills | Status: DC | PRN

## 2018-02-19 MED ORDER — COLESTIPOL HCL 1 G PO TABS: ORAL_TABLET | ORAL | 2 refills | Status: DC

## 2018-02-19 MED ORDER — ZOLPIDEM TARTRATE 10 MG PO TABS: 10.0000 mg | ORAL_TABLET | Freq: Every evening | ORAL | 1 refills | Status: DC | PRN

## 2018-02-19 MED ORDER — FLUOXETINE HCL 20 MG PO CAPS: 20.0000 mg | ORAL_CAPSULE | Freq: Every day | ORAL | 2 refills | Status: DC

## 2018-02-19 NOTE — Progress Notes (Signed)
HPI:   [name] is a 63 y.o. male, who is here today to establish care with me.  Former PCP: Dr Ellison,endocrinologist. Last preventive routine visit: 11/2016.  Chronic medical problems: chronic hep B,DM II,myastenis gravis,anxiety,insomnia,psoriasis, and red cell aplasia among some.  He continues following with Dr Loanne Drilling for DM II, q 6 months. On Januvia 100 mg and Metformin.   Lab Results  Component Value Date   HGBA1C 5.5 12/31/2017  Diabetes thought to be related to chronic prednisone use.    HLD, he is on Colestipol 1 g 5 times per day. He is also following a low fat diet. Tolerating medication well,no side effects reported.  Lab Results  Component Value Date   CHOL 170 12/25/2016   HDL 48.40 12/25/2016   LDLCALC 68 07/23/2008   LDLDIRECT 87.0 12/25/2016   TRIG 209.0 (H) 12/25/2016   CHOLHDL 4 12/25/2016    Psoriasis, he follows with dermatologist annually. Requesting refills on Lidex 0.05% oint,which was prescribed a while ago to treat pruritic lesions on hands. His dermatologists prescribes Triamcinolone 0.1 cream to use on back lesions bid as needed. He is also on light therapy.  Ophthalmologist annually.  Myasthenia gravis,he follows with neurologist as needed, Dr Burnett Harry at [facility]. Symptoms are well controlled.  Anxiety,depression, and insomnia: He is on Prozac 20 mg daily. He has been on medications for "long time." Symptoms otherwise stable States that medication is mainly for anxiety.  For insomnia he takes Ambien 10 mg,which he has been taking for "may years." He takes Ambien about 3-4 times per week. When he takes medication he sleeps"pretty good." Denies suicidal thoughts.    Review of Systems  Constitutional: Positive for fatigue. Negative for activity change, appetite change and fever.  HENT: Negative for nosebleeds, sore throat and trouble swallowing.   Eyes: Negative for redness and visual disturbance.  Respiratory:  Negative for apnea, cough, shortness of breath and wheezing.   Cardiovascular: Negative for chest pain, palpitations and leg swelling.  Gastrointestinal: Negative for abdominal pain, nausea and vomiting.  Endocrine: Negative for polydipsia, polyphagia and polyuria.  Genitourinary: Negative for decreased urine volume, dysuria and hematuria.  Musculoskeletal: Negative for gait problem and myalgias.  Skin: Positive for rash. Negative for wound.  Neurological: Positive for headaches (occasionally). Negative for syncope and weakness.  Psychiatric/Behavioral: Positive for sleep disturbance. Negative for suicidal ideas. The patient is nervous/anxious.       Current Outpatient Medications on File Prior to Visit  Medication Sig Dispense Refill  . calcium carbonate (OS-CAL) 600 MG TABS Take 600 mg by mouth 3 (three) times daily with meals.      . Cholecalciferol (VITAMIN D) 2000 UNITS tablet Take 2,000 Units by mouth daily.      Marland Kitchen desonide (DESONATE) 0.05 % gel Apply 1 application topically 2 (two) times daily as needed.     . entecavir (BARACLUDE) 1 MG tablet Take 1 tablet (1 mg total) by mouth daily. 30 tablet 6  . glucose blood test strip USE TO TEST BLOOD SUGAR THREE TIMES A DAY 100 each 2  . JANUVIA 100 MG tablet TAKE ONE TABLET BY MOUTH DAILY 90 tablet 10  . ketoconazole (NIZORAL) 2 % cream Apply 1 application topically 2 (two) times daily as needed for irritation.     . metFORMIN (GLUCOPHAGE-XR) 500 MG 24 hr tablet TAKE TWO TABLETS BY MOUTH TWICE A DAY 360 tablet 0  . omeprazole (PRILOSEC) 20 MG capsule TAKE ONE CAPSULE BY MOUTH DAILY 90  capsule 0  . pimecrolimus (ELIDEL) 1 % cream Apply 1 application topically 2 (two) times daily as needed.     . predniSONE (DELTASONE) 5 MG tablet Take 3 tablets (15 mg total) by mouth every other day. 30 tablet 0  . pyridostigmine (MESTINON) 60 MG tablet Take 60 mg by mouth 3 (three) times daily.     Marland Kitchen triamcinolone cream (KENALOG) 0.1 % Apply 1 application  topically 2 (two) times daily as needed.      No current facility-administered medications on file prior to visit.      Past Medical History:  Diagnosis Date  . Anxiety   . Colon polyp   . Depressive disorder, not elsewhere classified   . External hemorrhoid   . Insomnia, unspecified   . Internal hemorrhoids   . Myasthenia gravis without exacerbation ([place])   . Other and unspecified hyperlipidemia   . 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 Type II or unspecified type diabetes mellitus without mention of complication, not stated as uncontrolled   . 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 Azithromycin Other (See Comments)    REACTION: exac of his mg  . Beta Adrenergic Blockers Other (See Comments)    REACTION: exac of mg    History reviewed. No pertinent family history.  Social History   Socioeconomic History  . Marital status: Married    Spouse name: Not on file  . Number of children: Not on file  . Years of education: Not on file  . Highest education level: Not on file  Occupational History  . Not on file  Social Needs  . Financial resource strain: Not on file  . Food insecurity:    Worry: Not on file    Inability: Not on file  . Transportation needs:    Medical: Not on file    Non-medical: Not on file  Tobacco Use  . Smoking status: Never Smoker  . Smokeless tobacco: Never Used  Substance and Sexual Activity  . Alcohol use: No  . Drug use: No  . Sexual activity: Not on file  Lifestyle  . Physical activity:    Days per week: Not on file    Minutes per session: Not on file  . Stress: Not on file  Relationships  . Social connections:    Talks on phone: Not on file    Gets together: Not on file    Attends religious service: Not on file    Active member of club or organization: Not on file    Attends meetings of clubs or organizations: Not on file     Relationship status: Not on file  Other Topics Concern  . Not on file  Social History Narrative  . Not on file    Vitals:   02/19/18 1001  BP: 120/86  Resp: 12  Temp: 98.3 F (36.8 C)    Body mass index is 20.47 kg/m.    Physical Exam  Nursing note and vitals reviewed. Constitutional: He is oriented to person, place, and time. He appears well-developed and well-nourished. No distress.  HENT:  Head: Normocephalic and atraumatic.  Mouth/Throat: Oropharynx is clear and moist and mucous membranes are normal.  Eyes: Pupils are equal, round, and reactive to light. Conjunctivae are normal.  Left palpebral ptosis.  Cardiovascular: Normal rate and regular rhythm.  No murmur heard. Pulses:      Dorsalis  pedis pulses are 2+ on the right side and 2+ on the left side.  Respiratory: Effort normal and breath sounds normal. No respiratory distress.  GI: Soft. He exhibits no mass. There is no hepatomegaly. There is no abdominal tenderness.  Musculoskeletal:        General: No edema.  Lymphadenopathy:    He has no cervical adenopathy.  Neurological: He is alert and oriented to person, place, and time. He has normal strength. No cranial nerve deficit. Gait normal.  Skin: Skin is warm. No rash noted. No erythema.  Psychiatric: His mood appears anxious.  Well groomed, good eye contact.    ASSESSMENT AND PLAN:   Mr. Yehudah was seen today for establish care.  Diagnoses and all orders for this visit:  Insomnia, unspecified type Problem seems to be well controlled with Ambien. Side effects discussed. Good sleep hygiene also recommended. F/U in 5-6 months,before if needed.  -     zolpidem (AMBIEN) 10 MG tablet; Take 1 tablet (10 mg total) by mouth at bedtime as needed.  HYPERCHOLESTEROLEMIA No changes in current management. Continue low fat diet. We will plan on FLP next OV.  -     colestipol (COLESTID) 1 g tablet; TAKE FIVE TABLETS BY MOUTH DAILY  Recurrent major depressive  disorder, in partial remission ([facility]) Stable. No changes in current management. Instructed about warning signs.  -     FLUoxetine (PROZAC) 20 MG capsule; Take 1 capsule (20 mg total) by mouth daily.  Psoriasis Recommend staying on one topical steroid, side effects discussed. He will discuss this with dermatologist.  -     fluocinonide ointment (LIDEX) 0.05 %; Apply 1 application topically 2 (two) times daily as needed.  Anxiety disorder, unspecified type Stable. No changes in Prozac. F/U in 5-6 months,before if needed.  -     FLUoxetine (PROZAC) 20 MG capsule; Take 1 capsule (20 mg total) by mouth daily.  Need for prophylactic vaccination and inoculation against influenza -     Flu Vaccine QUAD 6+ mos PF IM (Fluarix Quad PF)    Return in about 5 months (around 07/21/2018) for F/U and needs Medicare visit.     Hava Massingale G. [place], MD  [facility]. [place] office.

## 2018-02-19 NOTE — Patient Instructions (Addendum)
A few things to remember from today's visit:   HYPERCHOLESTEROLEMIA  Psoriasis  Insomnia, unspecified type  Recurrent major depressive disorder, in partial remission ([place])   Please be sure medication list is accurate. If a new problem present, please set up appointment sooner than planned today.

## 2018-03-21 ENCOUNTER — Other Ambulatory Visit: Payer: Self-pay | Admitting: Endocrinology

## 2018-03-22 DIAGNOSIS — G7000 Myasthenia gravis without (acute) exacerbation: Secondary | ICD-10-CM | POA: Diagnosis not present

## 2018-03-22 DIAGNOSIS — R918 Other nonspecific abnormal finding of lung field: Secondary | ICD-10-CM | POA: Diagnosis not present

## 2018-03-22 DIAGNOSIS — D150 Benign neoplasm of thymus: Secondary | ICD-10-CM | POA: Diagnosis not present

## 2018-03-24 ENCOUNTER — Other Ambulatory Visit: Payer: Self-pay | Admitting: Endocrinology

## 2018-03-24 NOTE — Telephone Encounter (Signed)
Please forward refill request to pt's new primary care provider.  

## 2018-04-01 ENCOUNTER — Ambulatory Visit (INDEPENDENT_AMBULATORY_CARE_PROVIDER_SITE_OTHER): Payer: Medicare Other | Admitting: Endocrinology

## 2018-04-01 ENCOUNTER — Other Ambulatory Visit: Payer: Self-pay

## 2018-04-01 ENCOUNTER — Encounter: Payer: Self-pay | Admitting: Endocrinology

## 2018-04-01 VITALS — BP 136/82 | HR 100 | Ht 65.0 in | Wt 122.8 lb

## 2018-04-01 DIAGNOSIS — E119 Type 2 diabetes mellitus without complications: Secondary | ICD-10-CM

## 2018-04-01 LAB — POCT GLYCOSYLATED HEMOGLOBIN (HGB A1C): HEMOGLOBIN A1C: 6.1 % — AB (ref 4.0–5.6)

## 2018-04-01 MED ORDER — REPAGLINIDE 0.5 MG PO TABS: 0.5000 mg | ORAL_TABLET | Freq: Three times a day (TID) | ORAL | 11 refills | Status: DC

## 2018-04-01 NOTE — Patient Instructions (Addendum)
I have sent a prescription to your pharmacy, to add "repaglinide."  You would take this only when your blood sugar goes over 200.   Please continue the same other diabetes medications.   check your blood sugar once a day.  vary the time of day when you check, between before the 3 meals, and at bedtime.  also check if you have symptoms of your blood sugar being too high or too low.  please keep a record of the readings and bring it to your next appointment here (or you can bring the meter itself).  You can write it on any piece of paper.  please call us sooner if your blood sugar goes below 70, or if you have a lot of readings over 200. Please come back for a follow-up appointment in 3 months.  ???????????????????????????????????????????????????? "repaglinide" ? ?????????????????????????????????????????????? ??? ? ???????????????????????????????? ???????????????????????????????????????????????? ???????????????????????????????????????????????????? ? ???????????????? ??????????????????????????????????????????????????????????????????????????????? ?????????????????????????????????????????????????????????????????????? (??????????????????????????) ? ?????????????????????????????? ?????????????????????????????????????????????????????????????? ?? ??????????????????????????? ??? ? ??????????????????????????????????????????? ? ???

## 2018-04-01 NOTE — Progress Notes (Signed)
Subjective:    Patient ID: [name], male    DOB: [birth year], 63 y.o.   MRN: [id number]  HPI Pt returns for f/u of diabetes mellitus: DM type: 2, but he is presumed to be evolving type 1, given lean body habitus, MG, psoriasis, and neg FHx of DM.   Dx'ed: 4128 Complications: none Therapy: 3 oral meds DKA: never Severe hypoglycemia: never.  Pancreatitis: never.  Other: he has never been on insulin.  Interval history: He says cbg's vary from 78-200's.  pt states he feels well in general.  Prednisone has been increased to 20 mg qd.   Past Medical History:  Diagnosis Date  . Anxiety   . Colon polyp   . Depressive disorder, not elsewhere classified   . External hemorrhoid   . Insomnia, unspecified   . Internal hemorrhoids   . Myasthenia gravis without exacerbation ([place])   . Other and unspecified hyperlipidemia   . 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 Type II or unspecified type diabetes mellitus without mention of complication, not stated as uncontrolled   . Viral hepatitis B without mention of hepatic coma, chronic, without mention of hepatitis delta     Past Surgical History:  Procedure Laterality Date  . THYMECTOMY      Social History   Socioeconomic History  . Marital status: Married    Spouse name: Not on file  . Number of children: Not on file  . Years of education: Not on file  . Highest education level: Not on file  Occupational History  . Not on file  Social Needs  . Financial resource strain: Not on file  . Food insecurity:    Worry: Not on file    Inability: Not on file  . Transportation needs:    Medical: Not on file    Non-medical: Not on file  Tobacco Use  . Smoking status: Never Smoker  . Smokeless tobacco: Never Used  Substance and Sexual Activity  . Alcohol use: No  . Drug use: No  . Sexual activity: Not on file  Lifestyle  . Physical activity:    Days per week: Not on file    Minutes per session: Not on file  .  Stress: Not on file  Relationships  . Social connections:    Talks on phone: Not on file    Gets together: Not on file    Attends religious service: Not on file    Active member of club or organization: Not on file    Attends meetings of clubs or organizations: Not on file    Relationship status: Not on file  . Intimate partner violence:    Fear of current or ex partner: Not on file    Emotionally abused: Not on file    Physically abused: Not on file    Forced sexual activity: Not on file  Other Topics Concern  . Not on file  Social History Narrative  . Not on file    Current Outpatient Medications on File Prior to Visit  Medication Sig Dispense Refill  . calcium carbonate (OS-CAL) 600 MG TABS Take 600 mg by mouth 3 (three) times daily with meals.      . Cholecalciferol (VITAMIN D) 2000 UNITS tablet Take 2,000 Units by mouth daily.      . colestipol (COLESTID) 1 g tablet TAKE FIVE TABLETS BY MOUTH DAILY 450 tablet 2  . desonide (DESONATE) 0.05 % gel Apply 1 application topically 2 (two) times  daily as needed.     . entecavir (BARACLUDE) 1 MG tablet Take 1 tablet (1 mg total) by mouth daily. 30 tablet 6  . fluocinonide ointment (LIDEX) 0.96 % Apply 1 application topically 2 (two) times daily as needed. 30 g 1  . FLUoxetine (PROZAC) 20 MG capsule Take 1 capsule (20 mg total) by mouth daily. 90 capsule 2  . glucose blood test strip USE TO TEST BLOOD SUGAR THREE TIMES A DAY 100 each 2  . JANUVIA 100 MG tablet TAKE ONE TABLET BY MOUTH DAILY 90 tablet 10  . ketoconazole (NIZORAL) 2 % cream Apply 1 application topically 2 (two) times daily as needed for irritation.     . metFORMIN (GLUCOPHAGE-XR) 500 MG 24 hr tablet TAKE TWO TABLETS BY MOUTH TWICE A DAY 360 tablet 0  . omeprazole (PRILOSEC) 20 MG capsule TAKE ONE CAPSULE BY MOUTH DAILY 90 capsule 0  . pimecrolimus (ELIDEL) 1 % cream Apply 1 application topically 2 (two) times daily as needed.     . predniSONE (DELTASONE) 5 MG tablet Take  3 tablets (15 mg total) by mouth every other day. 30 tablet 0  . pyridostigmine (MESTINON) 60 MG tablet Take 60 mg by mouth 3 (three) times daily.     Marland Kitchen triamcinolone cream (KENALOG) 0.1 % Apply 1 application topically 2 (two) times daily as needed.     . zolpidem (AMBIEN) 10 MG tablet Take 1 tablet (10 mg total) by mouth at bedtime as needed. 30 tablet 1   No current facility-administered medications on file prior to visit.     Allergies  Allergen Reactions  . Azithromycin Other (See Comments)    REACTION: exac of his mg  . Beta Adrenergic Blockers Other (See Comments)    REACTION: exac of mg    No family history on file.  BP 136/82 (BP Location: Left Arm, Patient Position: Sitting, Cuff Size: Normal)   Pulse 100   Ht 5\' 5"  (1.651 m)   Wt 122 lb 12.8 oz (55.7 kg)   SpO2 97%   BMI 20.43 kg/m    Review of Systems He denies hypoglycemia.      Objective:   Physical Exam VITAL SIGNS:  See vs page GENERAL: no distress Pulses: dorsalis pedis intact bilat.   MSK: no deformity of the feet CV: no leg edema Skin:  no ulcer on the feet.  normal color and temp on the feet. Neuro: sensation is intact to touch on the feet   A1c=6.1%  Lab Results  Component Value Date   TSH 0.878 09/17/2017       Assessment & Plan:  Type 2 DM: worse MG: we discussed the fact that he'll need to take repaglinide intermittently, according to prednosine dose  Patient Instructions  I have sent a prescription to your pharmacy, to add "repaglinide."  You would take this only when your blood sugar goes over 200.   Please continue the same other diabetes medications.   check your blood sugar once a day.  vary the time of day when you check, between before the 3 meals, and at bedtime.  also check if you have symptoms of your blood sugar being too high or too low.  please keep a record of the readings and bring it to your next appointment here (or you can bring the meter itself).  You can write it on  any piece of paper.  please call us sooner if your blood sugar goes below 70, or if you have  a lot of readings over 200. Please come back for a follow-up appointment in 3 months.  ???????????????????????????????????????????????????? "repaglinide" ? ?????????????????????????????????????????????? ??? ? ???????????????????????????????? ???????????????????????????????????????????????? ???????????????????????????????????????????????????? ? ???????????????? ??????????????????????????????????????????????????????????????????????????????? ?????????????????????????????????????????????????????????????????????? (??????????????????????????) ? ?????????????????????????????? ?????????????????????????????????????????????????????????????? ?? ??????????????????????????? ??? ? ??????????????????????????????????????????? ? ???

## 2018-04-03 DIAGNOSIS — D150 Benign neoplasm of thymus: Secondary | ICD-10-CM | POA: Diagnosis not present

## 2018-04-03 DIAGNOSIS — Z79899 Other long term (current) drug therapy: Secondary | ICD-10-CM | POA: Diagnosis not present

## 2018-04-03 DIAGNOSIS — G7000 Myasthenia gravis without (acute) exacerbation: Secondary | ICD-10-CM | POA: Diagnosis not present

## 2018-06-19 ENCOUNTER — Other Ambulatory Visit: Payer: Self-pay | Admitting: Endocrinology

## 2018-07-02 ENCOUNTER — Ambulatory Visit: Payer: Medicare Other | Admitting: Endocrinology

## 2018-07-17 ENCOUNTER — Other Ambulatory Visit: Payer: Self-pay | Admitting: Endocrinology

## 2018-07-17 NOTE — Telephone Encounter (Signed)
Please forward refill request to pt's new primary care provider.  

## 2018-07-18 ENCOUNTER — Other Ambulatory Visit: Payer: Self-pay | Admitting: Endocrinology

## 2018-07-19 ENCOUNTER — Telehealth: Payer: Self-pay | Admitting: Family Medicine

## 2018-07-19 NOTE — Telephone Encounter (Signed)
omeprazole (PRILOSEC) 20 MG capsule  [address]   Previous Dr Loanne Drilling pt

## 2018-07-22 ENCOUNTER — Ambulatory Visit: Payer: Medicare Other | Admitting: Family Medicine

## 2018-07-24 NOTE — Telephone Encounter (Signed)
Patient scheduled appointment for medication refills on 07/25/2018.

## 2018-07-25 ENCOUNTER — Ambulatory Visit (INDEPENDENT_AMBULATORY_CARE_PROVIDER_SITE_OTHER): Payer: Medicare Other | Admitting: Family Medicine

## 2018-07-25 ENCOUNTER — Encounter: Payer: Self-pay | Admitting: Family Medicine

## 2018-07-25 ENCOUNTER — Other Ambulatory Visit: Payer: Self-pay

## 2018-07-25 DIAGNOSIS — G4700 Insomnia, unspecified: Secondary | ICD-10-CM

## 2018-07-25 DIAGNOSIS — E7800 Pure hypercholesterolemia, unspecified: Secondary | ICD-10-CM | POA: Diagnosis not present

## 2018-07-25 DIAGNOSIS — L409 Psoriasis, unspecified: Secondary | ICD-10-CM

## 2018-07-25 DIAGNOSIS — K219 Gastro-esophageal reflux disease without esophagitis: Secondary | ICD-10-CM

## 2018-07-25 MED ORDER — OMEPRAZOLE 20 MG PO CPDR: 20.0000 mg | DELAYED_RELEASE_CAPSULE | Freq: Every day | ORAL | 2 refills | Status: DC

## 2018-07-25 MED ORDER — FLUOCINONIDE 0.05 % EX CREA: 1.0000 "application " | TOPICAL_CREAM | Freq: Two times a day (BID) | CUTANEOUS | 1 refills | Status: DC

## 2018-07-25 MED ORDER — COLESTIPOL HCL 1 G PO TABS: ORAL_TABLET | ORAL | 2 refills | Status: DC

## 2018-07-25 NOTE — Assessment & Plan Note (Signed)
Problem is well controlled with Omeprazole 20 mg daily. Recommend taking medication with empty stomach,either 30 min before breakfast or 3 hours ater dinner,before going to bed. GERD precautions to continue.

## 2018-07-25 NOTE — Progress Notes (Signed)
Virtual Visit via Telephone Note  I connected with [name] on 07/25/18 at 10:00 AM EDT by telephone and verified that I am speaking with the correct person using two identifiers.   I discussed the limitations, risks, security and privacy concerns of performing an evaluation and management service by telephone and the availability of in person appointments. I also discussed with the patient that there may be a patient responsible charge related to this service. The patient expressed understanding and agreed to proceed.  Location patient: home Location provider: home office Participants present for the call: patient, provider Patient did not have a visit in the prior 7 days to address this/these issue(s).   History of Present Illness: Mr [name] is a 63 yo male with Hx of DM II,myasthenia gravis,and anxiety among some following on chronic disease management.  Requesting refills on Omeprazole 20 mg, which he has taken for 10+ years. He takes med daily at different times of the day and with food.  Medication has helped with stomach discomfort, "hard to describe" and frequent burping. Hx of GERD.  Denies abdominal pain, nausea, vomiting, changes in bowel habits, blood in stool or melena.  Psoriasis, he would like topical steroid to be changed from oint to cream. He follows with derma annually. Also uses Elidel and Triamcinolone cream for rash in certain areas and prn. He has not noted tender lesion or exudate.   HLD,takes Colestipol 1 g 5 times per day. He does not remember trying statin medications. Tolerating medications well. He also follows low fat diet.  Lab Results  Component Value Date   CHOL 170 12/25/2016   HDL 48.40 12/25/2016   LDLCALC 68 07/23/2008   LDLDIRECT 87.0 12/25/2016   TRIG 209.0 (H) 12/25/2016   CHOLHDL 4 12/25/2016   Insomnia,he is on Ambien 10 mg daily as needed,takes med 3-4 times per week. Sleeps well,no side effects noted.  Has been taking  medication for several years. + Hx of anxiety and depression,well controlled with Prozac.  Observations/Objective: Patient sounds cheerful and well on the phone. I do not appreciate any SOB,cough,or wheezing. Speech and thought processing are grossly intact. Patient reported vitals:N/A  Assessment and Plan:  Psoriasis Lindex changed to cream, continue applying it on affected area bid prn. No changes on rest of topical medications. Follows with dermatologist.  GERD Problem is well controlled with Omeprazole 20 mg daily. Recommend taking medication with empty stomach,either 30 min before breakfast or 3 hours ater dinner,before going to bed. GERD precautions to continue.   HYPERCHOLESTEROLEMIA We will plan on checking FLP next OV. No changes in current management. We dicussed benefits of stain meds,which could be an option in the future. Low fat diet also recommended.   INSOMNIA [place] controlled substance web site reviewed ,last filled 02/19/18. He has a refill available. Ambien side effects discussed. Good sleep hygiene also recommended. F/U in 6 months.   Follow Up Instructions:  Return in about 6 months (around 01/24/2019) for cpe and f/u.  I did not refer this patient for an OV in the next 24 hours for this/these issue(s).  I discussed the assessment and treatment plan with the patient. He was provided an opportunity to ask questions and all were answered. The patient agreed with the plan and demonstrated an understanding of the instructions.    I provided 22 minutes of non-face-to-face time during this encounter.   Betty Martinique, MD

## 2018-07-25 NOTE — Assessment & Plan Note (Signed)
[place] controlled substance web site reviewed ,last filled 02/19/18. He has a refill available. Ambien side effects discussed. Good sleep hygiene also recommended. F/U in 6 months.

## 2018-07-25 NOTE — Assessment & Plan Note (Addendum)
Lindex changed to cream, continue applying it on affected area bid prn. No changes on rest of topical medications. Follows with dermatologist.

## 2018-07-25 NOTE — Assessment & Plan Note (Signed)
We will plan on checking FLP next OV. No changes in current management. We dicussed benefits of stain meds,which could be an option in the future. Low fat diet also recommended.

## 2018-07-26 DIAGNOSIS — B181 Chronic viral hepatitis B without delta-agent: Secondary | ICD-10-CM | POA: Diagnosis not present

## 2018-07-26 DIAGNOSIS — Z79899 Other long term (current) drug therapy: Secondary | ICD-10-CM | POA: Diagnosis not present

## 2018-08-20 ENCOUNTER — Other Ambulatory Visit: Payer: Self-pay

## 2018-08-20 DIAGNOSIS — E119 Type 2 diabetes mellitus without complications: Secondary | ICD-10-CM

## 2018-08-20 MED ORDER — SITAGLIPTIN PHOSPHATE 100 MG PO TABS: 100.0000 mg | ORAL_TABLET | Freq: Every day | ORAL | 3 refills | Status: DC

## 2018-08-28 DIAGNOSIS — Z79899 Other long term (current) drug therapy: Secondary | ICD-10-CM | POA: Diagnosis not present

## 2018-08-28 DIAGNOSIS — D150 Benign neoplasm of thymus: Secondary | ICD-10-CM | POA: Diagnosis not present

## 2018-08-28 DIAGNOSIS — G7000 Myasthenia gravis without (acute) exacerbation: Secondary | ICD-10-CM | POA: Diagnosis not present

## 2018-09-03 DIAGNOSIS — B181 Chronic viral hepatitis B without delta-agent: Secondary | ICD-10-CM | POA: Diagnosis not present

## 2018-09-04 DIAGNOSIS — H26491 Other secondary cataract, right eye: Secondary | ICD-10-CM | POA: Diagnosis not present

## 2018-09-04 LAB — HM DIABETES EYE EXAM

## 2018-09-11 DIAGNOSIS — B181 Chronic viral hepatitis B without delta-agent: Secondary | ICD-10-CM | POA: Diagnosis not present

## 2018-09-13 ENCOUNTER — Telehealth: Payer: Self-pay | Admitting: Family Medicine

## 2018-09-13 NOTE — Telephone Encounter (Signed)
Copied from [place] [id number]. Topic: Appointment Scheduling - Scheduling Inquiry for Clinic >> Sep 13, 2018  9:28 AM Reyne Dumas L wrote: Reason for CRM:   Pt calling because he has a rash on his shoulder going into torso.

## 2018-09-13 NOTE — Telephone Encounter (Signed)
Spoke with patient and he scheduled appointment for 09/17/2018.

## 2018-09-16 ENCOUNTER — Other Ambulatory Visit: Payer: Self-pay

## 2018-09-16 DIAGNOSIS — E119 Type 2 diabetes mellitus without complications: Secondary | ICD-10-CM

## 2018-09-16 MED ORDER — SITAGLIPTIN PHOSPHATE 100 MG PO TABS: 100.0000 mg | ORAL_TABLET | Freq: Every day | ORAL | 3 refills | Status: DC

## 2018-09-17 ENCOUNTER — Ambulatory Visit (INDEPENDENT_AMBULATORY_CARE_PROVIDER_SITE_OTHER): Payer: Medicare Other | Admitting: Family Medicine

## 2018-09-17 ENCOUNTER — Other Ambulatory Visit: Payer: Self-pay | Admitting: Endocrinology

## 2018-09-17 DIAGNOSIS — Z0000 Encounter for general adult medical examination without abnormal findings: Secondary | ICD-10-CM | POA: Diagnosis not present

## 2018-09-17 DIAGNOSIS — B181 Chronic viral hepatitis B without delta-agent: Secondary | ICD-10-CM | POA: Diagnosis not present

## 2018-09-17 DIAGNOSIS — L282 Other prurigo: Secondary | ICD-10-CM | POA: Diagnosis not present

## 2018-09-17 LAB — BASIC METABOLIC PANEL
BUN: 11 mg/dL (ref 6–23)
CO2: 27 mEq/L (ref 19–32)
Calcium: 10.1 mg/dL (ref 8.4–10.5)
Chloride: 97 mEq/L (ref 96–112)
Creatinine, Ser: 1.08 mg/dL (ref 0.40–1.50)
GFR: 69.25 mL/min (ref >60.00)
Glucose, Bld: 184 mg/dL — ABNORMAL HIGH (ref 70–99)
Potassium: 3.6 mEq/L (ref 3.5–5.1)
Sodium: 137 mEq/L (ref 135–145)

## 2018-09-17 LAB — VITAMIN D 25 HYDROXY (VIT D DEFICIENCY, FRACTURES): VITD: 64.09 ng/mL (ref 30.00–100.00)

## 2018-09-17 NOTE — Patient Instructions (Signed)
  [name] , Thank you for taking time to come for your Medicare Wellness Visit. I appreciate your ongoing commitment to your health goals. Please review the following plan we discussed and let me know if I can assist you in the future.   These are the goals we discussed: Goals   None     This is a list of the screening recommended for you and due dates:  Health Maintenance  Topic Date Due  . Colon Cancer Screening  01/09/2017  . Urine Protein Check  12/25/2017  . Flu Shot  08/31/2018  . Hemoglobin A1C  10/02/2018  . Complete foot exam   04/01/2019  . Eye exam for diabetics  09/04/2019  . Tetanus Vaccine  09/30/2023  . Pneumococcal vaccine  Completed  .  Hepatitis C: One time screening is recommended by Center for Disease Control  (CDC) for  adults born from [birth year] through 1964.   Completed  . HIV Screening  Discontinued    A few things to remember from today's visit:   Hypophosphatemia - Plan: VITAMIN D 25 Hydroxy (Vit-D Deficiency, Fractures), Basic metabolic panel, Parathyroid hormone, intact (no Ca)  Pruritic rash  Cetaphil is a moisturizer you can use after taking a shower. Use lukewarm water for showering. Continue applying topical steroid. Discussed this rash with your dermatologist.   Please be sure medication list is accurate. If a new problem present, please set up appointment sooner than planned today.

## 2018-09-17 NOTE — Progress Notes (Addendum)
ACUTE VISIT   HPI:  Chief Complaint  Patient presents with  . Rash    generalized body rash itchy. When pt scratch it spread. pt uses a cream but denies relief  . Medicare Wellness    [name] is a 63 y.o. male, who is here today concerned about recent blood work results. Last were ordered by ID. He also would like to go through some lab results, ordered by ID. Concern about low phosphorus.  Mildly elevated WBC's and neutrophils at 11.4 and 7.8. He is on chronic Prednisone 15 mg daily for myathenia gravis. CMP normal. Serum phosphorus low at 2.4. Ca++ was not checked.  In general he follows a healthful diet.  He is also c/o pruritic rash. Not erythematous unless he scratches area.  Hx of eczema and it may be similar to an episode he had a year ago. He does nit remember any new medication, detergent, soap, or body product. No known insect bite or outdoor exposures to plants. No sick contact. No Hx of eczema or similar rash in the past.  OTC medication for this problem:None. He has applied Rx topical steroid. Problem has improved.   He denies oral lesions/edema,cough, wheezing, dyspnea, abdominal pain, nausea, or vomiting.  Lab Results  Component Value Date   CREATININE 1.00 09/22/2017   BUN 14 09/22/2017   NA 138 09/22/2017   K 4.2 09/22/2017   CL 104 09/22/2017   CO2 30 09/22/2017   AWV  He does not remember having a Medicare visit. He is on disability because myasthenia gravis. On medicare since 2019.  Originally from [place].  He lives with his wife and his 2 children (70 and 69 yo sons) Independent ADL's and IADL's. No falls in the past year and denies depression symptoms.  He has hx of chronic hep B,PTSD,malignant thymoma s/p radiation and resection in 2003,DM II,myasthenia gravis since 2004,eczema,HLD,and depression among some.  He exercises regularly,walks 3 times per week for about 30 min  Functional Status Survey: Is the  patient deaf or have difficulty hearing?: No Does the patient have difficulty seeing, even when wearing glasses/contacts?: No Does the patient have difficulty concentrating, remembering, or making decisions?: No Does the patient have difficulty walking or climbing stairs?: No Does the patient have difficulty dressing or bathing?: No Does the patient have difficulty doing errands alone such as visiting a doctor's office or shopping?: No  Fall Risk  09/17/2018  Falls in the past year? 0  Number falls in past yr: 0  Injury with Fall? 0  Follow up Education provided    Providers he sees regularly: [facility] care provider: [facility]. Dx radiologist, Dr Barnett Hatter Neurologist,Dr Burnett Harry Endocrinologist, Dr Loanne Drilling. Last visit 04/01/18. Oncologist, Dr Mariam Dollar, therapeutic apheresis for plasma for myasthenia.  ID,Dr Patsy [place] Dermatologist,does not remember name of provider, ? Dr Brigitte Pulse.  Depression screen [facility] 2/9 09/17/2018  Decreased Interest 0  Down, Depressed, Hopeless 0  PHQ - 2 Score 0    Mini-Cog - 09/17/18 1406    Normal clock drawing test?  yes    How many words correct?  3        Hearing Screening   125Hz  250Hz  500Hz  1000Hz  2000Hz  3000Hz  4000Hz  6000Hz  8000Hz   Right ear:           Left ear:           Vision Screening Comments: Refused. Having eye exam and in a few weeks.   Review of Systems  Constitutional:  Positive for fatigue. Negative for activity change, appetite change and fever.  HENT: Negative for nosebleeds, sore throat and trouble swallowing.   Eyes: Negative for redness and visual disturbance.  Cardiovascular: Negative for chest pain and palpitations.  Gastrointestinal:       No changes in bowel habits.  Endocrine: Negative for cold intolerance and heat intolerance.  Genitourinary: Negative for decreased urine volume, dysuria and hematuria.  Neurological: Negative for seizures, weakness and headaches.  Psychiatric/Behavioral: The patient is nervous/anxious.    Rest see pertinent positives and negatives per HPI.   Current Outpatient Medications on File Prior to Visit  Medication Sig Dispense Refill  . calcium carbonate (OS-CAL) 600 MG TABS Take 600 mg by mouth 3 (three) times daily with meals.      . Cholecalciferol (VITAMIN D) 2000 UNITS tablet Take 2,000 Units by mouth daily.      . colestipol (COLESTID) 1 g tablet TAKE FIVE TABLETS BY MOUTH DAILY 450 tablet 2  . desonide (DESONATE) 0.05 % gel Apply 1 application topically 2 (two) times daily as needed.     . entecavir (BARACLUDE) 1 MG tablet Take 1 tablet (1 mg total) by mouth daily. 30 tablet 6  . fluocinonide cream (LIDEX) 6.60 % Apply 1 application topically 2 (two) times daily. 45 g 1  . FLUoxetine (PROZAC) 20 MG capsule Take 1 capsule (20 mg total) by mouth daily. 90 capsule 2  . glucose blood test strip USE TO TEST BLOOD SUGAR THREE TIMES A DAY 100 each 2  . ketoconazole (NIZORAL) 2 % cream Apply 1 application topically 2 (two) times daily as needed for irritation.     Marland Kitchen omeprazole (PRILOSEC) 20 MG capsule Take 1 capsule (20 mg total) by mouth daily. 90 capsule 2  . pimecrolimus (ELIDEL) 1 % cream Apply 1 application topically 2 (two) times daily as needed.     . predniSONE (DELTASONE) 5 MG tablet Take 3 tablets (15 mg total) by mouth every other day. 30 tablet 0  . pyridostigmine (MESTINON) 60 MG tablet Take 60 mg by mouth 3 (three) times daily.     . repaglinide (PRANDIN) 0.5 MG tablet Take 1 tablet (0.5 mg total) by mouth 3 (three) times daily before meals. Take only if blood sugar is over 200. 90 tablet 11  . sitaGLIPtin (JANUVIA) 100 MG tablet Take 1 tablet (100 mg total) by mouth daily. 90 tablet 3  . triamcinolone cream (KENALOG) 0.1 % Apply 1 application topically 2 (two) times daily as needed.     . zolpidem (AMBIEN) 10 MG tablet Take 1 tablet (10 mg total) by mouth at bedtime as needed. 30 tablet 1   No current facility-administered medications on file prior to visit.       Past Medical History:  Diagnosis Date  . Anxiety   . Colon polyp   . Depressive disorder, not elsewhere classified   . External hemorrhoid   . Insomnia, unspecified   . Internal hemorrhoids   . Myasthenia gravis without exacerbation ([place])   . Other and unspecified hyperlipidemia   . 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 Type II or unspecified type diabetes mellitus without mention of complication, not stated as uncontrolled   . 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 Azithromycin Other (See Comments)    REACTION: exac of his mg  . Beta Adrenergic Blockers Other (See Comments)  REACTION: exac of mg    Social History   Socioeconomic History  . Marital status: Married    Spouse name: Not on file  . Number of children: Not on file  . Years of education: Not on file  . Highest education level: Not on file  Occupational History  . Not on file  Social Needs  . Financial resource strain: Not on file  . Food insecurity    Worry: Not on file    Inability: Not on file  . Transportation needs    Medical: Not on file    Non-medical: Not on file  Tobacco Use  . Smoking status: Never Smoker  . Smokeless tobacco: Never Used  Substance and Sexual Activity  . Alcohol use: No  . Drug use: No  . Sexual activity: Not on file  Lifestyle  . Physical activity    Days per week: Not on file    Minutes per session: Not on file  . Stress: Not on file  Relationships  . Social Herbalist on phone: Not on file    Gets together: Not on file    Attends religious service: Not on file    Active member of club or organization: Not on file    Attends meetings of clubs or organizations: Not on file    Relationship status: Not on file  Other Topics Concern  . Not on file  Social History Narrative  . Not on file    Vitals:   09/19/18 1927  BP: 120/80  Pulse: 80  Resp:  12  Temp: 98.2 F (36.8 C)  SpO2: 97%   Body mass index is 19.97 kg/m.  Physical Exam  Nursing note and vitals reviewed. Constitutional: He is oriented to person, place, and time. He appears well-developed and well-nourished. No distress.  HENT:  Head: Normocephalic and atraumatic.  Mouth/Throat: Oropharynx is clear and moist and mucous membranes are normal.  Eyes: Pupils are equal, round, and reactive to light. Conjunctivae are normal.  Neck: No thyroid mass present.  Cardiovascular: Normal rate and regular rhythm.  No murmur heard. Respiratory: Effort normal and breath sounds normal. No respiratory distress.  GI: Soft. He exhibits no mass. There is no hepatomegaly. There is no abdominal tenderness.  Musculoskeletal:        General: No edema.  Lymphadenopathy:    He has no cervical adenopathy.  Neurological: He is alert and oriented to person, place, and time. He has normal strength. No cranial nerve deficit. Gait normal.  Skin: Skin is warm and dry. Rash noted. No erythema.  Micropapular non erythematous rash on lateral aspect of abdomen and lower trunk.    Psychiatric: His mood appears anxious. Cognition and memory are normal.  Well groomed, good eye contact.   ASSESSMENT AND PLAN:  Mr. Jaelan was seen today for rash and medicare wellness.  Diagnoses and all orders for this visit: Lab Results  Component Value Date   CREATININE 1.08 09/17/2018   BUN 11 09/17/2018   NA 137 09/17/2018   K 3.6 09/17/2018   CL 97 09/17/2018   CO2 27 09/17/2018    Hypophosphatemia We discussed possible etiologies,not clear at this time. Hx of hyperCa++ ? Medications. ? Radiation. Further recommendations will be given according to lab results.  -     VITAMIN D 25 Hydroxy (Vit-D Deficiency, Fractures) -     Basic metabolic panel -     Parathyroid hormone, intact (no Ca)  Pruritic rash Possible causes  discussed. Most likely related to eczema,atopic dermatitis. Improving.  Recommend avoiding hot showers and apply a moisturizer after shower. Keep next appt with dermatologist.  Chronic hepatitis B virus infection ([place]) Continue following with ID.  Medicare annual wellness visit, subsequent We discussed the importance of staying active, physically and mentally, as well as the benefits of a healthy/balnace diet. Low impact exercise that involve stretching and strengthing are ideal. Vaccines up to date. We discussed preventive screening for the next 5-10 years, summery of recommendations given in AVS:  He is not interested in further screening. Colonoscopy is overdue,he doe snot want to have it done. Fall prevention.  Advance directives and end of life discussed, he does not have POA or living will.States that his wife know what to do incase he cannot make health care decisions.     Return if symptoms worsen or fail to improve, for Keep next f/u appt/.    G. Martinique, MD  [facility]. [place] office.

## 2018-09-18 LAB — PARATHYROID HORMONE, INTACT (NO CA): PTH: 6 pg/mL — ABNORMAL LOW (ref 14–64)

## 2018-09-19 ENCOUNTER — Encounter: Payer: Self-pay | Admitting: Family Medicine

## 2018-09-24 ENCOUNTER — Other Ambulatory Visit: Payer: Self-pay | Admitting: *Deleted

## 2018-09-24 DIAGNOSIS — L738 Other specified follicular disorders: Secondary | ICD-10-CM | POA: Diagnosis not present

## 2018-09-24 DIAGNOSIS — L219 Seborrheic dermatitis, unspecified: Secondary | ICD-10-CM | POA: Diagnosis not present

## 2018-09-24 DIAGNOSIS — L700 Acne vulgaris: Secondary | ICD-10-CM | POA: Diagnosis not present

## 2018-09-24 DIAGNOSIS — L309 Dermatitis, unspecified: Secondary | ICD-10-CM | POA: Diagnosis not present

## 2018-09-24 DIAGNOSIS — L299 Pruritus, unspecified: Secondary | ICD-10-CM | POA: Diagnosis not present

## 2018-10-14 ENCOUNTER — Other Ambulatory Visit: Payer: Self-pay

## 2018-10-16 ENCOUNTER — Other Ambulatory Visit: Payer: Self-pay

## 2018-10-16 ENCOUNTER — Encounter: Payer: Self-pay | Admitting: Endocrinology

## 2018-10-16 ENCOUNTER — Ambulatory Visit (INDEPENDENT_AMBULATORY_CARE_PROVIDER_SITE_OTHER): Payer: Medicare Other | Admitting: Endocrinology

## 2018-10-16 VITALS — BP 130/80 | HR 99 | Ht 65.0 in | Wt 124.0 lb

## 2018-10-16 DIAGNOSIS — E119 Type 2 diabetes mellitus without complications: Secondary | ICD-10-CM | POA: Diagnosis not present

## 2018-10-16 DIAGNOSIS — M810 Age-related osteoporosis without current pathological fracture: Secondary | ICD-10-CM

## 2018-10-16 DIAGNOSIS — Z23 Encounter for immunization: Secondary | ICD-10-CM

## 2018-10-16 LAB — POCT GLYCOSYLATED HEMOGLOBIN (HGB A1C): Hemoglobin A1C: 5.6 % (ref 4.0–5.6)

## 2018-10-16 NOTE — Patient Instructions (Addendum)
Please continue the same medications. We'll follow the calcium and parathyroid levels for now.  Let's recheck the bone density.   Please come back for a follow-up appointment in 6 months.

## 2018-10-16 NOTE — Progress Notes (Signed)
Subjective:    Patient ID: [name], male    DOB: [birth year], 63 y.o.   MRN: [id number]  HPI Pt returns for f/u of diabetes mellitus: DM type: 2, but he is presumed to be evolving type 1, given lean body habitus, MG, psoriasis, and neg FHx of DM.   Dx'ed: [id number] Complications: none Therapy: 3 oral meds DKA: never Severe hypoglycemia: never.  Pancreatitis: never.  Other: he has never been on insulin.  Interval history: He says cbg's vary from 78-200's.  pt states he feels well in general.  Prednisone has been reduced to 15 mg QD.   Pt has had fluctuating levels of Ca++ and PTH (sometimes low, sometimes normal; since 2015; vit-D has been persistently normal on rx; these were normal: vit-A, PTH-RP, Mg++, and 24-HR urine Ca++).  He takes vit-D, 2000 units/day Past Medical History:  Diagnosis Date  . Anxiety   . Colon polyp   . Depressive disorder, not elsewhere classified   . External hemorrhoid   . Insomnia, unspecified   . Internal hemorrhoids   . Myasthenia gravis without exacerbation ([place])   . Other and unspecified hyperlipidemia   . 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 Type II or unspecified type diabetes mellitus without mention of complication, not stated as uncontrolled   . Viral hepatitis B without mention of hepatic coma, chronic, without mention of hepatitis delta     Past Surgical History:  Procedure Laterality Date  . THYMECTOMY      Social History   Socioeconomic History  . Marital status: Married    Spouse name: Not on file  . Number of children: Not on file  . Years of education: Not on file  . Highest education level: Not on file  Occupational History  . Not on file  Social Needs  . Financial resource strain: Not on file  . Food insecurity    Worry: Not on file    Inability: Not on file  . Transportation needs    Medical: Not on file    Non-medical: Not on file  Tobacco Use  . Smoking status: Never Smoker  . Smokeless  tobacco: Never Used  Substance and Sexual Activity  . Alcohol use: No  . Drug use: No  . Sexual activity: Not on file  Lifestyle  . Physical activity    Days per week: Not on file    Minutes per session: Not on file  . Stress: Not on file  Relationships  . Social Herbalist on phone: Not on file    Gets together: Not on file    Attends religious service: Not on file    Active member of club or organization: Not on file    Attends meetings of clubs or organizations: Not on file    Relationship status: Not on file  . Intimate partner violence    Fear of current or ex partner: Not on file    Emotionally abused: Not on file    Physically abused: Not on file    Forced sexual activity: Not on file  Other Topics Concern  . Not on file  Social History Narrative  . Not on file    Current Outpatient Medications on File Prior to Visit  Medication Sig Dispense Refill  . calcium carbonate (OS-CAL) 600 MG TABS Take 600 mg by mouth 3 (three) times daily with meals.      . Cholecalciferol (VITAMIN D) 2000 UNITS tablet Take 2,000  Units by mouth daily.      . colestipol (COLESTID) 1 g tablet TAKE FIVE TABLETS BY MOUTH DAILY 450 tablet 2  . desonide (DESONATE) 0.05 % gel Apply 1 application topically 2 (two) times daily as needed.     . entecavir (BARACLUDE) 1 MG tablet Take 1 tablet (1 mg total) by mouth daily. 30 tablet 6  . fluocinonide cream (LIDEX) [id number] % Apply 1 application topically 2 (two) times daily. 45 g 1  . FLUoxetine (PROZAC) 20 MG capsule Take 1 capsule (20 mg total) by mouth daily. 90 capsule 2  . glucose blood test strip USE TO TEST BLOOD SUGAR THREE TIMES A DAY 100 each 2  . ketoconazole (NIZORAL) 2 % cream Apply 1 application topically 2 (two) times daily as needed for irritation.     . metFORMIN (GLUCOPHAGE-XR) 500 MG 24 hr tablet TAKE TWO TABLETS BY MOUTH TWICE A DAY 360 tablet 0  . omeprazole (PRILOSEC) 20 MG capsule Take 1 capsule (20 mg total) by mouth daily.  90 capsule 2  . pimecrolimus (ELIDEL) 1 % cream Apply 1 application topically 2 (two) times daily as needed.     . predniSONE (DELTASONE) 5 MG tablet Take 3 tablets (15 mg total) by mouth every other day. 30 tablet 0  . pyridostigmine (MESTINON) 60 MG tablet Take 60 mg by mouth 3 (three) times daily.     . repaglinide (PRANDIN) 0.5 MG tablet Take 1 tablet (0.5 mg total) by mouth 3 (three) times daily before meals. Take only if blood sugar is over 200. 90 tablet 11  . sitaGLIPtin (JANUVIA) 100 MG tablet Take 1 tablet (100 mg total) by mouth daily. 90 tablet 3  . triamcinolone cream (KENALOG) 0.1 % Apply 1 application topically 2 (two) times daily as needed.     . zolpidem (AMBIEN) 10 MG tablet Take 1 tablet (10 mg total) by mouth at bedtime as needed. 30 tablet 1   No current facility-administered medications on file prior to visit.     Allergies  Allergen Reactions  . Azithromycin Other (See Comments)    REACTION: exac of his mg  . Beta Adrenergic Blockers Other (See Comments)    REACTION: exac of mg    No family history on file.  BP 130/80 (BP Location: Right Arm, Patient Position: Sitting, Cuff Size: Normal)   Pulse 99   Ht 5\' 5"  (1.651 m)   Wt 124 lb (56.2 kg)   SpO2 99%   BMI 20.63 kg/m   Review of Systems He denies hypoglycemia.      Objective:   Physical Exam VITAL SIGNS:  See vs page GENERAL: no distress Pulses: dorsalis pedis intact bilat.   MSK: no deformity of the feet CV: no leg edema Skin:  no ulcer on the feet.  normal color and temp on the feet. Neuro: sensation is intact to touch on the feet    Lab Results  Component Value Date   HGBA1C 5.6 10/16/2018   Lab Results  Component Value Date   CREATININE 1.08 09/17/2018   BUN 11 09/17/2018   NA 137 09/17/2018   K 3.6 09/17/2018   CL 97 09/17/2018   CO2 27 09/17/2018   Lab Results  Component Value Date   PTH 6 (L) 09/17/2018   CALCIUM 10.1 09/17/2018   CAION 1.11 (L) 03/11/2010        Assessment & Plan:  Type 2 DM: well-controlled Hypoparathyroidism, uncertain etiology.  As Ca++ is normal, we'll follow. Osteoporosis:  due for recheck  Patient Instructions  Please continue the same medications. We'll follow the calcium and parathyroid levels for now.  Let's recheck the bone density.   Please come back for a follow-up appointment in 6 months.

## 2018-10-21 ENCOUNTER — Ambulatory Visit (INDEPENDENT_AMBULATORY_CARE_PROVIDER_SITE_OTHER)
Admission: RE | Admit: 2018-10-21 | Discharge: 2018-10-21 | Disposition: A | Discharge status: Home / Self Care | Payer: Medicare Other | Source: Ambulatory Visit | Attending: Endocrinology | Admitting: Endocrinology

## 2018-10-21 ENCOUNTER — Other Ambulatory Visit: Payer: Self-pay

## 2018-10-21 DIAGNOSIS — M810 Age-related osteoporosis without current pathological fracture: Secondary | ICD-10-CM

## 2018-11-05 ENCOUNTER — Ambulatory Visit: Payer: Medicare Other | Admitting: Endocrinology

## 2018-12-16 ENCOUNTER — Other Ambulatory Visit: Payer: Self-pay | Admitting: Family Medicine

## 2018-12-16 ENCOUNTER — Other Ambulatory Visit: Payer: Self-pay | Admitting: Endocrinology

## 2018-12-16 DIAGNOSIS — F3341 Major depressive disorder, recurrent, in partial remission: Secondary | ICD-10-CM

## 2018-12-16 DIAGNOSIS — F419 Anxiety disorder, unspecified: Secondary | ICD-10-CM

## 2018-12-18 ENCOUNTER — Other Ambulatory Visit: Payer: Self-pay

## 2018-12-18 ENCOUNTER — Encounter: Payer: Self-pay | Admitting: Family Medicine

## 2018-12-18 ENCOUNTER — Ambulatory Visit (INDEPENDENT_AMBULATORY_CARE_PROVIDER_SITE_OTHER): Payer: Medicare Other | Admitting: Family Medicine

## 2018-12-18 VITALS — BP 140/82 | HR 100 | Temp 97.6°F | Resp 16 | Ht 65.0 in | Wt 125.5 lb

## 2018-12-18 DIAGNOSIS — G4700 Insomnia, unspecified: Secondary | ICD-10-CM

## 2018-12-18 DIAGNOSIS — R519 Headache, unspecified: Secondary | ICD-10-CM | POA: Diagnosis not present

## 2018-12-18 DIAGNOSIS — F419 Anxiety disorder, unspecified: Secondary | ICD-10-CM | POA: Diagnosis not present

## 2018-12-18 DIAGNOSIS — M79601 Pain in right arm: Secondary | ICD-10-CM | POA: Diagnosis not present

## 2018-12-18 DIAGNOSIS — F3341 Major depressive disorder, recurrent, in partial remission: Secondary | ICD-10-CM | POA: Diagnosis not present

## 2018-12-18 DIAGNOSIS — M542 Cervicalgia: Secondary | ICD-10-CM | POA: Diagnosis not present

## 2018-12-18 MED ORDER — GABAPENTIN 100 MG PO CAPS: 300.0000 mg | ORAL_CAPSULE | Freq: Every day | ORAL | 0 refills | Status: DC

## 2018-12-18 NOTE — Patient Instructions (Signed)
A few things to remember from today's visit:   Insomnia, unspecified type  Diffuse pain in right upper extremity - Plan: gabapentin (NEURONTIN) 100 MG capsule, MR Cervical Spine Wo Contrast  Cervicalgia - Plan: MR Cervical Spine Wo Contrast  Headache, unspecified headache type  Today we are starting Gabapentin trying to help with right arm pain. Start gabapentin 100 mg at bedtime for 7-10 days then increase to 2 caps for 7-10 days,then 3 caps (300 mg ).  If pain is better with low dose,you do not have to increase Gabapentin.  We will arrange cervical MRI to look for pinch nerve.  Please be sure medication list is accurate. If a new problem present, please set up appointment sooner than planned today.

## 2018-12-18 NOTE — Assessment & Plan Note (Signed)
Stable. Continue Prozac 40 mg daily. F/U in 5-6 months , before if needed.

## 2018-12-18 NOTE — Assessment & Plan Note (Signed)
Problem is stable. No changes in current management. 

## 2018-12-18 NOTE — Progress Notes (Addendum)
HPI:   [name] is a 63 y.o. male with hx of myasthenia gravis,DM II, psoriasis, depression,and anxiety who is here today for chronic disease management.  He was last seen on 09/16/17 for acute visit. Sometimes he gets stress but in general he is dealing well with daily stressful situations.  He is on Prozac 20 mg daily, it is still helping and no side effects. Denies depressed mood or suicidal thoughts.  Insomnia: He is on Ambien 10 mg , he takes 1/2 to 1 tab, not very frequent, 1-2 times per week.  He has not identified exacerbating factors.  He has not filled Rx for Ambien since 01/2018. He sleeps 6-7 hours. No side effects reported.    C/O right shoulder pain. He has had pain intermittently for years. Shooting like pain that last a few seconds, 6/10.  Exacerbated by lying on right side. Alleviating by movement and position changes.  He denies numbness,tingling,burning,extremity weakness. No neck pain,limitation of ROM,edema,erythema,or rash.  He is not taking OTC medication. He mentions occipital headache he has had for a while, usually when he is in bed and improve when he gets up. Dull like pain,"not bad." No associated photophobia,visual changes,nausea,vomiting,ot focal weakness.  Head CT 08/2017:Negative.  Cervical MRI on 11/01/2001.  1.  C3-4 FOCAL CENTRAL ANNULAR DISC BULGE. 2.  C5-6 BROAD-BASED CENTRAL ANNULAR DISC BULGE, PROMINENT RIGHT POSTEROLATERALLY. 3.  C6-7 FOCAL CENTRAL ANNULAR DISC BULGE. 4.  THE CERVICAL CORD APPEARS NORMAL.   Review of Systems  Constitutional: Positive for fatigue. Negative for activity change, appetite change and fever.  HENT: Negative for nosebleeds and sore throat.   Eyes: Negative for redness and visual disturbance.  Respiratory: Negative for cough, shortness of breath and wheezing.   Cardiovascular: Negative for chest pain, palpitations and leg swelling.  Gastrointestinal: Negative for abdominal pain.   No changes in bowel habits.  Endocrine: Negative for cold intolerance and heat intolerance.  Neurological: Negative for syncope and facial asymmetry.  Psychiatric/Behavioral: Negative for confusion. The patient is nervous/anxious.   Rest of ROS, see pertinent positives sand negatives in HPI  Current Outpatient Medications on File Prior to Visit  Medication Sig Dispense Refill  . calcium carbonate (OS-CAL) 600 MG TABS Take 600 mg by mouth 3 (three) times daily with meals.      . Cholecalciferol (VITAMIN D) 2000 UNITS tablet Take 2,000 Units by mouth daily.      . colestipol (COLESTID) 1 g tablet TAKE FIVE TABLETS BY MOUTH DAILY 450 tablet 2  . desonide (DESONATE) 0.05 % gel Apply 1 application topically 2 (two) times daily as needed.     . entecavir (BARACLUDE) 1 MG tablet Take 1 tablet (1 mg total) by mouth daily. 30 tablet 6  . fluocinonide cream (LIDEX) [id number] % Apply 1 application topically 2 (two) times daily. 45 g 1  . FLUoxetine (PROZAC) 20 MG capsule TAKE ONE CAPSULE BY MOUTH DAILY 90 capsule 1  . glucose blood test strip USE TO TEST BLOOD SUGAR THREE TIMES A DAY 100 each 2  . ketoconazole (NIZORAL) 2 % cream Apply 1 application topically 2 (two) times daily as needed for irritation.     . metFORMIN (GLUCOPHAGE-XR) 500 MG 24 hr tablet TAKE TWO TABLETS BY MOUTH TWICE A DAY 360 tablet 0  . omeprazole (PRILOSEC) 20 MG capsule Take 1 capsule (20 mg total) by mouth daily. 90 capsule 2  . pimecrolimus (ELIDEL) 1 % cream Apply 1 application topically 2 (two) times daily  as needed.     . predniSONE (DELTASONE) 5 MG tablet Take 3 tablets (15 mg total) by mouth every other day. 30 tablet 0  . pyridostigmine (MESTINON) 60 MG tablet Take 60 mg by mouth 3 (three) times daily.     . repaglinide (PRANDIN) 0.5 MG tablet Take 1 tablet (0.5 mg total) by mouth 3 (three) times daily before meals. Take only if blood sugar is over 200. 90 tablet 11  . sitaGLIPtin (JANUVIA) 100 MG tablet Take 1 tablet (100 mg  total) by mouth daily. 90 tablet 3  . triamcinolone cream (KENALOG) 0.1 % Apply 1 application topically 2 (two) times daily as needed.      No current facility-administered medications on file prior to visit.      Past Medical History:  Diagnosis Date  . Anxiety   . Colon polyp   . Depressive disorder, not elsewhere classified   . External hemorrhoid   . Insomnia, unspecified   . Internal hemorrhoids   . Myasthenia gravis without exacerbation ([place])   . Other and unspecified hyperlipidemia   . 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 Type II or unspecified type diabetes mellitus without mention of complication, not stated as uncontrolled   . 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 Azithromycin Other (See Comments)    REACTION: exac of his mg  . Beta Adrenergic Blockers Other (See Comments)    REACTION: exac of mg    Social History   Socioeconomic History  . Marital status: Married    Spouse name: Not on file  . Number of children: Not on file  . Years of education: Not on file  . Highest education level: Not on file  Occupational History  . Not on file  Social Needs  . Financial resource strain: Not on file  . Food insecurity    Worry: Not on file    Inability: Not on file  . Transportation needs    Medical: Not on file    Non-medical: Not on file  Tobacco Use  . Smoking status: Never Smoker  . Smokeless tobacco: Never Used  Substance and Sexual Activity  . Alcohol use: No  . Drug use: No  . Sexual activity: Not on file  Lifestyle  . Physical activity    Days per week: Not on file    Minutes per session: Not on file  . Stress: Not on file  Relationships  . Social Herbalist on phone: Not on file    Gets together: Not on file    Attends religious service: Not on file    Active member of club or organization: Not on file    Attends meetings  of clubs or organizations: Not on file    Relationship status: Not on file  Other Topics Concern  . Not on file  Social History Narrative  . Not on file    Vitals:   12/18/18 0758  BP: 140/82  Pulse: 100  Resp: 16  Temp: 97.6 F (36.4 C)  SpO2: 98%   Body mass index is 20.88 kg/m.   Physical Exam  Nursing note and vitals reviewed. Constitutional: He is oriented to person, place, and time. He appears well-developed and well-nourished. No distress.  HENT:  Head: Normocephalic and atraumatic.  Mouth/Throat: Oropharynx is clear and moist and mucous membranes are normal.  Eyes: Pupils are  equal, round, and reactive to light. Conjunctivae are normal.  Cardiovascular: Normal rate and regular rhythm.  No murmur heard. Respiratory: Effort normal and breath sounds normal. No respiratory distress.  GI: Soft. He exhibits no mass. There is no hepatomegaly. There is no abdominal tenderness.  Musculoskeletal:        General: No edema.     Right shoulder: He exhibits normal range of motion, no tenderness and no bony tenderness.     Cervical back: He exhibits tenderness. He exhibits normal range of motion and no bony tenderness.       Back:  Lymphadenopathy:    He has no cervical adenopathy.  Neurological: He is alert and oriented to person, place, and time. He has normal strength. No cranial nerve deficit. Gait normal.  Skin: Skin is warm. No rash noted. No erythema.  Psychiatric: He has a normal mood and affect.  Well groomed, good eye contact.     ASSESSMENT AND PLAN:   Mr. Kiante Ninh was seen today for chronic disease management.  Orders Placed This Encounter  Procedures  . MR Cervical Spine Wo Contrast    INSOMNIA Problem is well controlled. Good sleep hygiene. No changes in Ambien for now. Some side effects discussed.    Major depression in partial remission ([facility]) Stable. Continue Prozac 40 mg daily. F/U in 5-6 months , before if needed.   Anxiety  disorder, unspecified Problem is stable. No changes in current management.   Diffuse pain in right upper extremity We discussed possible etiologies. Pain was not reproducible with examination today.  We discussed treatment options, he agrees with trying Gabapentin 100 mg at bedtime, he can titrate dose to 300 mg as tolerated.  - gabapentin (NEURONTIN) 100 MG capsule; Take 3 capsules (300 mg total) by mouth at bedtime.  Dispense: 90 capsule; Refill: 0 - MR Cervical Spine Wo Contrast; Future  Cervicalgia Local massage. Could be related with RUE pain. Cervical MRI will be arranged.  Headache, unspecified headache type ? Tension like headache. Stable for years. I do not think head imaging is needed at this time. Following with neuro for myasthenia gravis.   Return in about 5 months (around 05/18/2019) for Insomnia.    Slayter Moorhouse G. Martinique, MD  [facility]. [place] office.

## 2018-12-18 NOTE — Assessment & Plan Note (Addendum)
Problem is well controlled. Good sleep hygiene. No changes in Ambien for now. Some side effects discussed.

## 2018-12-21 MED ORDER — ZOLPIDEM TARTRATE 10 MG PO TABS: 10.0000 mg | ORAL_TABLET | Freq: Every evening | ORAL | 1 refills | Status: DC | PRN

## 2018-12-24 ENCOUNTER — Other Ambulatory Visit: Payer: Self-pay

## 2018-12-24 ENCOUNTER — Ambulatory Visit
Admission: RE | Admit: 2018-12-24 | Discharge: 2018-12-24 | Disposition: A | Discharge status: Home / Self Care | Payer: Medicare Other | Source: Ambulatory Visit | Attending: Family Medicine | Admitting: Family Medicine

## 2018-12-24 DIAGNOSIS — M4802 Spinal stenosis, cervical region: Secondary | ICD-10-CM | POA: Diagnosis not present

## 2018-12-24 DIAGNOSIS — M542 Cervicalgia: Secondary | ICD-10-CM

## 2018-12-24 DIAGNOSIS — M50222 Other cervical disc displacement at C5-C6 level: Secondary | ICD-10-CM | POA: Diagnosis not present

## 2018-12-24 DIAGNOSIS — M79601 Pain in right arm: Secondary | ICD-10-CM

## 2018-12-31 ENCOUNTER — Ambulatory Visit: Payer: Medicare Other | Admitting: Family Medicine

## 2019-01-06 ENCOUNTER — Telehealth: Payer: Self-pay | Admitting: *Deleted

## 2019-01-06 NOTE — Telephone Encounter (Signed)
Patient calling for results of scan:  Notes recorded by Martinique, [name], MD on 01/01/2019 at 1:36 PM EST  There are some cervical spine abnormalities that could explain right shoulder pain.  Orthopedist or neurosurgeon evaluation can be arranged if he is interested in doing so.Otherwise we can continue monitoring for changes.   Patient wants to hold off for now- will discuss at next appointment.

## 2019-01-08 ENCOUNTER — Other Ambulatory Visit: Payer: Self-pay | Admitting: *Deleted

## 2019-01-08 NOTE — Progress Notes (Signed)
Patient given results and would like to wait on referral.

## 2019-01-13 ENCOUNTER — Other Ambulatory Visit: Payer: Self-pay | Admitting: Endocrinology

## 2019-01-14 ENCOUNTER — Other Ambulatory Visit: Payer: Self-pay | Admitting: Family Medicine

## 2019-01-14 DIAGNOSIS — M79601 Pain in right arm: Secondary | ICD-10-CM

## 2019-01-28 ENCOUNTER — Ambulatory Visit (INDEPENDENT_AMBULATORY_CARE_PROVIDER_SITE_OTHER): Payer: Medicare Other | Admitting: Family Medicine

## 2019-01-28 ENCOUNTER — Other Ambulatory Visit: Payer: Self-pay

## 2019-01-28 ENCOUNTER — Encounter: Payer: Self-pay | Admitting: Family Medicine

## 2019-01-28 VITALS — BP 140/90 | HR 102 | Temp 96.3°F | Resp 16 | Ht 65.0 in | Wt 125.0 lb

## 2019-01-28 DIAGNOSIS — M79601 Pain in right arm: Secondary | ICD-10-CM | POA: Diagnosis not present

## 2019-01-28 DIAGNOSIS — R000 Tachycardia, unspecified: Secondary | ICD-10-CM

## 2019-01-28 DIAGNOSIS — M542 Cervicalgia: Secondary | ICD-10-CM | POA: Diagnosis not present

## 2019-01-28 DIAGNOSIS — I10 Essential (primary) hypertension: Secondary | ICD-10-CM

## 2019-01-28 DIAGNOSIS — B181 Chronic viral hepatitis B without delta-agent: Secondary | ICD-10-CM

## 2019-01-28 DIAGNOSIS — E119 Type 2 diabetes mellitus without complications: Secondary | ICD-10-CM

## 2019-01-28 DIAGNOSIS — F3341 Major depressive disorder, recurrent, in partial remission: Secondary | ICD-10-CM

## 2019-01-28 MED ORDER — GABAPENTIN 300 MG PO CAPS: 300.0000 mg | ORAL_CAPSULE | Freq: Three times a day (TID) | ORAL | 1 refills | Status: DC

## 2019-01-28 NOTE — Progress Notes (Signed)
HPI:  Chief Complaint  Patient presents with  . Follow-up    [name] is a 63 y.o. male, who is here today to follow on his last visit, 12/21/18. Right shoulder and extremity pain, which he has had for year. Gabapentin 100 mg did not help.  Problem is stable.  Interferes with sleep if lying on right side. Negative for numbness,tingling,weakness, or rash. Right-sided trapezium and cervical pain.  Sudden onset,shooting/throbbing pain from shoulder down to forearm. It lasts a few seconds, 6/10.  12/24/18 cervical MRI: 1. Soft disc extrusion at C5-6 to the right of midline which could affect the right C6 and C7 nerves. 2. Slight bilateral foraminal narrowing at C6-7.  BP mildly elevated. Reports elevated BP during visits with other providers and frequently it has to be re-checked. No known hx of HTN. Negative for unusual headache,visual changes,decreased urine output,gross hematuria,or edema.  Last neuro visit, BP was elevated at 160/103. 08/2018 hematology visit BP 155/102.   Hx of sinus tachycardia. States that sometimes he can "feel it." No associated CP,SOB,dizziness,or diaphoresis. He has not identified exacerbating or alleviating factors. He thinks it may be caused by anxiety.  Chronic viral hepatitis: He is on Entecavir 1 mg daily. Liver bx in 10/2002. Follows with hepatologist.t.  For myasthenia gravis he is still on daily Prednisone. DM II due to chronic prednisone treatment.  Listed on problem list is depression, related to [place] war. He denies depressed mood. He is on Prozac 20 mg daily that has also helped with anxiety.   Review of Systems  Constitutional: Positive for fatigue. Negative for chills, fever and unexpected weight change.  HENT: Negative for nosebleeds and sore throat.   Respiratory: Negative for cough, shortness of breath and stridor.   Gastrointestinal: Negative for abdominal pain, nausea and vomiting.  Musculoskeletal:  Negative for gait problem.  Skin: Negative for pallor and wound.  Neurological: Negative for syncope and facial asymmetry.  Psychiatric/Behavioral: Positive for sleep disturbance. Negative for confusion.  Rest see pertinent positives and negatives per HPI.   Current Outpatient Medications on File Prior to Visit  Medication Sig Dispense Refill  . calcium carbonate (OS-CAL) 600 MG TABS Take 600 mg by mouth 3 (three) times daily with meals.      . Cholecalciferol (VITAMIN D) 2000 UNITS tablet Take 2,000 Units by mouth daily.      . colestipol (COLESTID) 1 g tablet TAKE FIVE TABLETS BY MOUTH DAILY 450 tablet 2  . desonide (DESONATE) 0.05 % gel Apply 1 application topically 2 (two) times daily as needed.     . entecavir (BARACLUDE) 1 MG tablet Take 1 tablet (1 mg total) by mouth daily. 30 tablet 6  . fluocinonide cream (LIDEX) AB-123456789 % Apply 1 application topically 2 (two) times daily. 45 g 1  . FLUoxetine (PROZAC) 20 MG capsule TAKE ONE CAPSULE BY MOUTH DAILY 90 capsule 1  . glucose blood test strip USE TO TEST BLOOD SUGAR THREE TIMES A DAY 100 each 2  . ketoconazole (NIZORAL) 2 % cream Apply 1 application topically 2 (two) times daily as needed for irritation.     . Lancets (ONETOUCH ULTRASOFT) lancets USE AS INSTRUCTED TO CHECK BLOOD SUGARS THREE TIMES A DAY 200 each 3  . metFORMIN (GLUCOPHAGE-XR) 500 MG 24 hr tablet TAKE TWO TABLETS BY MOUTH TWICE A DAY 360 tablet 0  . omeprazole (PRILOSEC) 20 MG capsule Take 1 capsule (20 mg total) by mouth daily. 90 capsule 2  . pimecrolimus (  ELIDEL) 1 % cream Apply 1 application topically 2 (two) times daily as needed.     . predniSONE (DELTASONE) 5 MG tablet Take 3 tablets (15 mg total) by mouth every other day. 30 tablet 0  . pyridostigmine (MESTINON) 60 MG tablet Take 60 mg by mouth 3 (three) times daily.     . repaglinide (PRANDIN) 0.5 MG tablet Take 1 tablet (0.5 mg total) by mouth 3 (three) times daily before meals. Take only if blood sugar is over 200.  90 tablet 11  . sitaGLIPtin (JANUVIA) 100 MG tablet Take 1 tablet (100 mg total) by mouth daily. 90 tablet 3  . triamcinolone cream (KENALOG) 0.1 % Apply 1 application topically 2 (two) times daily as needed.     . zolpidem (AMBIEN) 10 MG tablet Take 1 tablet (10 mg total) by mouth at bedtime as needed. 30 tablet 1   No current facility-administered medications on file prior to visit.     Past Medical History:  Diagnosis Date  . Anxiety   . Colon polyp   . Depressive disorder, not elsewhere classified   . External hemorrhoid   . Insomnia, unspecified   . Internal hemorrhoids   . Myasthenia gravis without exacerbation ([place])   . Other and unspecified hyperlipidemia   . 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 Type II or unspecified type diabetes mellitus without mention of complication, not stated as uncontrolled   . 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 Azithromycin Other (See Comments)    REACTION: exac of his mg  . Beta Adrenergic Blockers Other (See Comments)    REACTION: exac of mg    Social History   Socioeconomic History  . Marital status: Married    Spouse name: Not on file  . Number of children: Not on file  . Years of education: Not on file  . Highest education level: Not on file  Occupational History  . Not on file  Tobacco Use  . Smoking status: Never Smoker  . Smokeless tobacco: Never Used  Substance and Sexual Activity  . Alcohol use: No  . Drug use: No  . Sexual activity: Not on file  Other Topics Concern  . Not on file  Social History Narrative  . Not on file   Social Determinants of Health   Financial Resource Strain:   . Difficulty of Paying Living Expenses: Not on file  Food Insecurity:   . Worried About Charity fundraiser in the Last Year: Not on file  . Ran Out of Food in the Last Year: Not on file  Transportation Needs:   . Lack  of Transportation (Medical): Not on file  . Lack of Transportation (Non-Medical): Not on file  Physical Activity:   . Days of Exercise per Week: Not on file  . Minutes of Exercise per Session: Not on file  Stress:   . Feeling of Stress : Not on file  Social Connections:   . Frequency of Communication with Friends and Family: Not on file  . Frequency of Social Gatherings with Friends and Family: Not on file  . Attends Religious Services: Not on file  . Active Member of Clubs or Organizations: Not on file  . Attends Archivist Meetings: Not on file  . Marital Status: Not on file    Vitals:   01/28/19 0843  BP: 140/90  Pulse: Marland Kitchen)  102  Resp: 16  Temp: (!) 96.3 F (35.7 C)  SpO2: 98%   Body mass index is 20.8 kg/m.   Physical Exam  Nursing note and vitals reviewed. Constitutional: He is oriented to person, place, and time. He appears well-developed and well-nourished. No distress.  HENT:  Head: Normocephalic and atraumatic.  Eyes: Pupils are equal, round, and reactive to light. Conjunctivae are normal.  Cardiovascular: Normal rate and regular rhythm.  No murmur heard. Pulses:      Dorsalis pedis pulses are 2+ on the right side and 2+ on the left side.  Respiratory: Effort normal and breath sounds normal. No respiratory distress.  GI: There is no hepatomegaly.  Musculoskeletal:        General: No edema.     Right shoulder: No tenderness. Normal range of motion.     Left shoulder: No tenderness. Normal range of motion.     Cervical back: Tenderness (Mild.) present. No bony tenderness. Normal range of motion.       Back:  Lymphadenopathy:    He has no cervical adenopathy.  Neurological: He is alert and oriented to person, place, and time. He has normal strength. No cranial nerve deficit. Gait normal.  Skin: Skin is warm. No rash noted. No erythema.  Psychiatric: His mood appears anxious. Cognition and memory are normal.  Well groomed, good eye contact.     ASSESSMENT AND PLAN:  Mr. Nadia was seen today for follow-up.  Diagnoses and all orders for this visit:  Cervicalgia With RUE radicular pain. Local heat and massage may help.  Diffuse pain in right upper extremity Radicular pain most likely, other possible causes discussed. He is not interested in neurosurgeon or ortho evaluation. He would like to try Gabapentin for longer and increase dose. Some side effects discussed. Instructed to let me know if he does not feel any improvement in 4-6 weeks, then we need to consider referral to ortho.  -     gabapentin (NEURONTIN) 300 MG capsule; Take 1 capsule (300 mg total) by mouth 3 (three) times daily.  Hypertension, essential, benign BP has been elevated for a few months, meets criteria for HTN. Possible complications of elevated BP discussed. He would benefit from BB. He does not want to take medication for now. Recommend monitoring BP at home. Low salt diet.  Sinus tachycardia Mild. Could be caused by anxiety. Instructed to check HR at home, I did teach him how to do it. Warning signs discussed.  Recurrent major depressive disorder, in partial remission ([place]) Well controlled. Continue Prozac 20 mg daily.  Type 2 diabetes mellitus without complication, without long-term current use of insulin ([facility]) Following with endocrinologist.  Chronic hepatitis B virus infection ([place]) On suppressive therapy due to chronic Prednisone intake,,entecavir 1 mg daily. Following with hematologist.    Return in about 3 months (around 04/28/2019) for BP,tachycardia,pain.   Aarini Slee G. [place], MD  [facility]. [place] office.

## 2019-01-28 NOTE — Patient Instructions (Signed)
A few things to remember from today's visit:   Cervicalgia  Diffuse pain in right upper extremity  Elevated blood pressure reading  Sinus tachycardia  Blood pressure goal for most people is less than 140/90. Some populations (older than 60) the goal is less than 150/90.  Most recent cardiologists' recommendations recommend blood pressure at or less than 130/80. I would like your blood pressure to be under 140/90 and heart rate less than 100/min.   Elevated blood pressure increases the risk of strokes, heart and kidney disease, and eye problems. Regular physical activity and a healthy diet (DASH diet) usually help. Low salt diet.  Ask your daughter to check blood pressure. You can check your pulse as instructed here in the office.  If blood pressure is still elevated you may benefit from medications like Metoprolol,atenolol,or propranolol.   Caution with some over the counter medications as cold medications, dietary products (for weight loss), and Ibuprofen or Aleve (frequent use);all these medications could cause elevation of blood pressure.  In regard to arm pain, I still think it may be a pinched nerve. We are increasing dose of Gabapentin to 300 mg at bedtime, we could even go with higher dose.  Medication can be been taking 3 times per day, remember it can cause drowsiness, so it may increase the risk of falls. If pain is persistent we will need to decide if referral to orthopedist or neurologist is needed.   Please be sure medication list is accurate. If a new problem present, please set up appointment sooner than planned today.

## 2019-01-31 ENCOUNTER — Encounter: Payer: Self-pay | Admitting: Family Medicine

## 2019-01-31 DIAGNOSIS — I10 Essential (primary) hypertension: Secondary | ICD-10-CM | POA: Insufficient documentation

## 2019-02-12 ENCOUNTER — Other Ambulatory Visit: Payer: Self-pay | Admitting: Family Medicine

## 2019-02-12 DIAGNOSIS — M79601 Pain in right arm: Secondary | ICD-10-CM

## 2019-03-15 ENCOUNTER — Other Ambulatory Visit: Payer: Self-pay | Admitting: Endocrinology

## 2019-04-10 ENCOUNTER — Other Ambulatory Visit: Payer: Self-pay

## 2019-04-14 ENCOUNTER — Ambulatory Visit (INDEPENDENT_AMBULATORY_CARE_PROVIDER_SITE_OTHER): Payer: Medicare Other | Admitting: Endocrinology

## 2019-04-14 ENCOUNTER — Other Ambulatory Visit: Payer: Self-pay | Admitting: Family Medicine

## 2019-04-14 ENCOUNTER — Encounter: Payer: Self-pay | Admitting: Endocrinology

## 2019-04-14 ENCOUNTER — Other Ambulatory Visit: Payer: Self-pay

## 2019-04-14 VITALS — BP 140/80 | HR 92 | Ht 65.0 in | Wt 123.8 lb

## 2019-04-14 DIAGNOSIS — E119 Type 2 diabetes mellitus without complications: Secondary | ICD-10-CM | POA: Diagnosis not present

## 2019-04-14 DIAGNOSIS — E559 Vitamin D deficiency, unspecified: Secondary | ICD-10-CM

## 2019-04-14 LAB — POCT GLYCOSYLATED HEMOGLOBIN (HGB A1C): Hemoglobin A1C: 5.9 % — AB (ref 4.0–5.6)

## 2019-04-14 LAB — VITAMIN D 25 HYDROXY (VIT D DEFICIENCY, FRACTURES): VITD: 51.81 ng/mL (ref 30.00–100.00)

## 2019-04-14 MED ORDER — COLESEVELAM HCL 625 MG PO TABS: 1875.0000 mg | ORAL_TABLET | Freq: Every day | ORAL | 3 refills | Status: DC

## 2019-04-14 NOTE — Progress Notes (Signed)
Subjective:    Patient ID: [name], male    DOB: 12-26-1955, 64 y.o.   MRN: [id number]  HPI Pt returns for f/u of diabetes mellitus: DM type: 2, but he is presumed to be evolving type 1, given lean body habitus, MG, psoriasis, and neg FHx of DM.   Dx'ed: [id number] Complications: none Therapy: 3 oral meds DKA: never Severe hypoglycemia: never.  Pancreatitis: never.  Other: he has never been on insulin.  Interval history: He says cbg's vary from 80-235.  pt states he feels well in general.  Prednisone has been reduced to 10 mg QD.  Pt says he can no longer obtain colestipol.   Pt has had fluctuating levels of Ca++ and PTH (sometimes low, sometimes normal; since 2015; vit-D has been persistently normal on rx; these were normal: vit-A, PTH-RP, Mg++, and 24-HR urine Ca++).  He takes vit-D, 1800 units/day.   Past Medical History:  Diagnosis Date  . Anxiety   . Colon polyp   . Depressive disorder, not elsewhere classified   . External hemorrhoid   . Insomnia, unspecified   . Internal hemorrhoids   . Myasthenia gravis without exacerbation ([place])   . Other and unspecified hyperlipidemia   . 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 Type II or unspecified type diabetes mellitus without mention of complication, not stated as uncontrolled   . Viral hepatitis B without mention of hepatic coma, chronic, without mention of hepatitis delta     Past Surgical History:  Procedure Laterality Date  . THYMECTOMY      Social History   Socioeconomic History  . Marital status: Married    Spouse name: Not on file  . Number of children: Not on file  . Years of education: Not on file  . Highest education level: Not on file  Occupational History  . Not on file  Tobacco Use  . Smoking status: Never Smoker  . Smokeless tobacco: Never Used  Substance and Sexual Activity  . Alcohol use: No  . Drug use: No  . Sexual activity: Not on file  Other Topics Concern  . Not on file    Social History Narrative  . Not on file   Social Determinants of Health   Financial Resource Strain:   . Difficulty of Paying Living Expenses:   Food Insecurity:   . Worried About Charity fundraiser in the Last Year:   . Arboriculturist in the Last Year:   Transportation Needs:   . Film/video editor (Medical):   Marland Kitchen Lack of Transportation (Non-Medical):   Physical Activity:   . Days of Exercise per Week:   . Minutes of Exercise per Session:   Stress:   . Feeling of Stress :   Social Connections:   . Frequency of Communication with Friends and Family:   . Frequency of Social Gatherings with Friends and Family:   . Attends Religious Services:   . Active Member of Clubs or Organizations:   . Attends Archivist Meetings:   Marland Kitchen Marital Status:   Intimate Partner Violence:   . Fear of Current or Ex-Partner:   . Emotionally Abused:   Marland Kitchen Physically Abused:   . Sexually Abused:     Current Outpatient Medications on File Prior to Visit  Medication Sig Dispense Refill  . calcium carbonate (OS-CAL) 600 MG TABS Take 600 mg by mouth 3 (three) times daily with meals.      . Cholecalciferol (VITAMIN  D) 2000 UNITS tablet Take 2,000 Units by mouth daily.      Marland Kitchen desonide (DESONATE) 0.05 % gel Apply 1 application topically 2 (two) times daily as needed.     . entecavir (BARACLUDE) 1 MG tablet Take 1 tablet (1 mg total) by mouth daily. 30 tablet 6  . fluocinonide cream (LIDEX) [id number] % Apply 1 application topically 2 (two) times daily. 45 g 1  . FLUoxetine (PROZAC) 20 MG capsule TAKE ONE CAPSULE BY MOUTH DAILY 90 capsule 1  . gabapentin (NEURONTIN) 300 MG capsule Take 1 capsule (300 mg total) by mouth 3 (three) times daily. 30 capsule 1  . glucose blood test strip USE TO TEST BLOOD SUGAR THREE TIMES A DAY 100 each 2  . ketoconazole (NIZORAL) 2 % cream Apply 1 application topically 2 (two) times daily as needed for irritation.     . Lancets (ONETOUCH ULTRASOFT) lancets USE AS  INSTRUCTED TO CHECK BLOOD SUGARS THREE TIMES A DAY 200 each 3  . metFORMIN (GLUCOPHAGE-XR) 500 MG 24 hr tablet TAKE TWO TABLETS BY MOUTH TWICE A DAY 360 tablet 0  . pimecrolimus (ELIDEL) 1 % cream Apply 1 application topically 2 (two) times daily as needed.     . predniSONE (DELTASONE) 5 MG tablet Take 3 tablets (15 mg total) by mouth every other day. 30 tablet 0  . pyridostigmine (MESTINON) 60 MG tablet Take 60 mg by mouth 3 (three) times daily.     . repaglinide (PRANDIN) 0.5 MG tablet Take 1 tablet (0.5 mg total) by mouth 3 (three) times daily before meals. Take only if blood sugar is over 200. 90 tablet 11  . sitaGLIPtin (JANUVIA) 100 MG tablet Take 1 tablet (100 mg total) by mouth daily. 90 tablet 3  . triamcinolone cream (KENALOG) 0.1 % Apply 1 application topically 2 (two) times daily as needed.     . zolpidem (AMBIEN) 10 MG tablet Take 1 tablet (10 mg total) by mouth at bedtime as needed. 30 tablet 1   No current facility-administered medications on file prior to visit.    Allergies  Allergen Reactions  . Azithromycin Other (See Comments)    REACTION: exac of his mg  . Beta Adrenergic Blockers Other (See Comments)    REACTION: exac of mg    No family history on file.  BP 140/80   Pulse 92   Ht 5\' 5"  (1.651 m)   Wt 123 lb 12.8 oz (56.2 kg)   SpO2 98%   BMI 20.60 kg/m    Review of Systems He denies hypoglycemia.      Objective:   Physical Exam VITAL SIGNS:  See vs page GENERAL: no distress Pulses: dorsalis pedis intact bilat.   MSK: no deformity of the feet CV: no leg edema Skin:  no ulcer on the feet.  normal color and temp on the feet. Neuro: sensation is intact to touch on the feet.    Lab Results  Component Value Date   HGBA1C 5.9 (A) 04/14/2019    Lab Results  Component Value Date   CREATININE 1.08 09/17/2018   BUN 11 09/17/2018   NA 137 09/17/2018   K 3.6 09/17/2018   CL 97 09/17/2018   CO2 27 09/17/2018       Assessment & Plan:  Type 2 DM:  well-controlled Med unavailability: change to a different resin Hypoparathyroidism: recheck today  Patient Instructions  I have sent a prescription to your pharmacy, to change colestipol to colesevelam. Please continue the same other  medications.  Blood tests are requested for you today.  We'll let you know about the results.  check your blood sugar 4 times a day.  vary the time of day when you check, between before the 3 meals, and at bedtime.  also check if you have symptoms of your blood sugar being too high or too low.  please keep a record of the readings and bring it to your next appointment here (or you can bring the meter itself).  You can write it on any piece of paper.  please call us sooner if your blood sugar goes below 70, or if you have a lot of readings over 200.  Please come back for a follow-up appointment in 6 months.

## 2019-04-14 NOTE — Patient Instructions (Addendum)
I have sent a prescription to your pharmacy, to change colestipol to colesevelam. Please continue the same other medications.  Blood tests are requested for you today.  We'll let you know about the results.  check your blood sugar 4 times a day.  vary the time of day when you check, between before the 3 meals, and at bedtime.  also check if you have symptoms of your blood sugar being too high or too low.  please keep a record of the readings and bring it to your next appointment here (or you can bring the meter itself).  You can write it on any piece of paper.  please call us sooner if your blood sugar goes below 70, or if you have a lot of readings over 200.  Please come back for a follow-up appointment in 6 months.

## 2019-04-15 ENCOUNTER — Telehealth: Payer: Self-pay

## 2019-04-15 LAB — PTH, INTACT AND CALCIUM
Calcium: 10 mg/dL (ref 8.6–10.3)
PTH: 12 pg/mL — ABNORMAL LOW (ref 14–64)

## 2019-04-15 NOTE — Telephone Encounter (Signed)
LAB RESULTS  Lab results were reviewed by Dr. Ellison. A letter has been mailed to pt home address. For future reference, letter can be found in Epic. 

## 2019-04-15 NOTE — Telephone Encounter (Signed)
-----   Message from Renato Shin, MD sent at 04/15/2019  4:43 PM EDT ----- please contact patient: Good results.  Please continue the same medications.  I'll see you next time.

## 2019-05-07 DIAGNOSIS — B181 Chronic viral hepatitis B without delta-agent: Secondary | ICD-10-CM | POA: Diagnosis not present

## 2019-05-13 ENCOUNTER — Other Ambulatory Visit: Payer: Self-pay

## 2019-05-13 DIAGNOSIS — E119 Type 2 diabetes mellitus without complications: Secondary | ICD-10-CM

## 2019-05-13 MED ORDER — SITAGLIPTIN PHOSPHATE 100 MG PO TABS: 100.0000 mg | ORAL_TABLET | Freq: Every day | ORAL | 3 refills | Status: DC

## 2019-05-14 ENCOUNTER — Other Ambulatory Visit: Payer: Self-pay | Admitting: Endocrinology

## 2019-05-17 IMAGING — CT CT HEAD W/O CM
4 series · 16 of 47 positions shown, 18 images · non-contrast
Comparison: Head CT dated 11/30/2008.

CLINICAL DATA: Neuro deficit, subacute. History of myasthenia
gravis. Intermittent numbness to chin and heel.

EXAM:
CT HEAD WITHOUT CONTRAST
TECHNIQUE: Contiguous axial images were obtained from the base of the skull
through the vertex without intravenous contrast.

[Series 2: head without · axial · non-contrast · 0.44mm/px · z∈[-136,-11]mm · 7 of 35 slices shown, 9 images]
[im 5/35  brain]
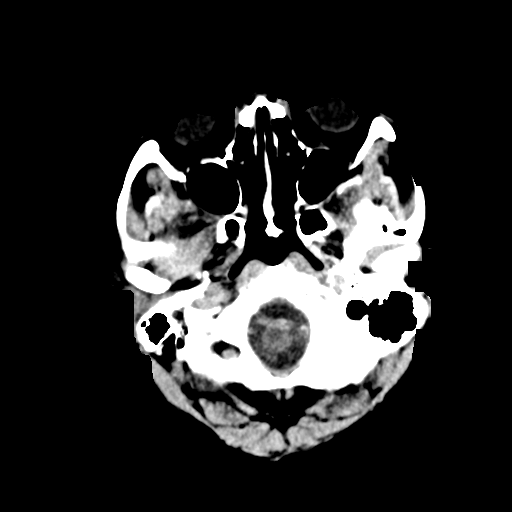
[im 5/35  bone]
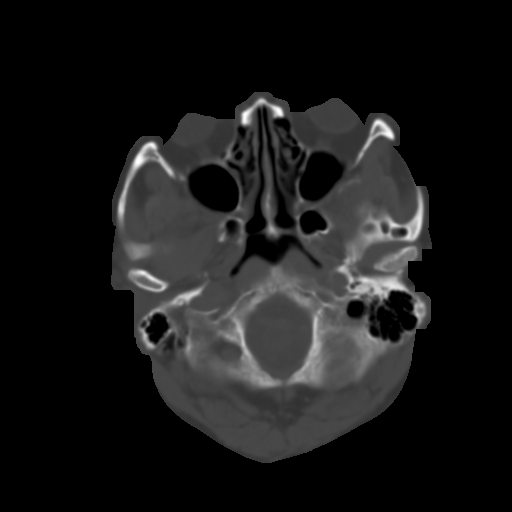
[im 9/35  brain]
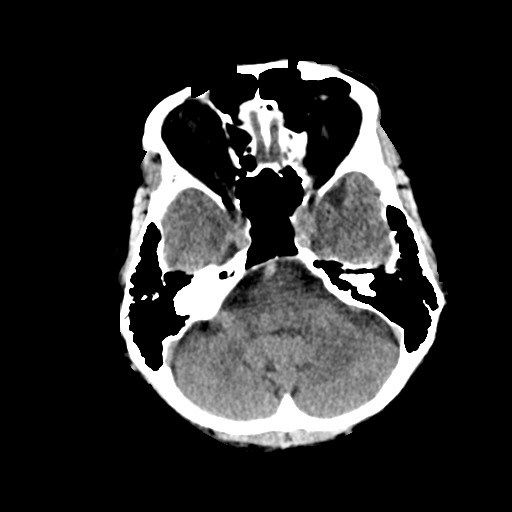
[im 13/35  brain]
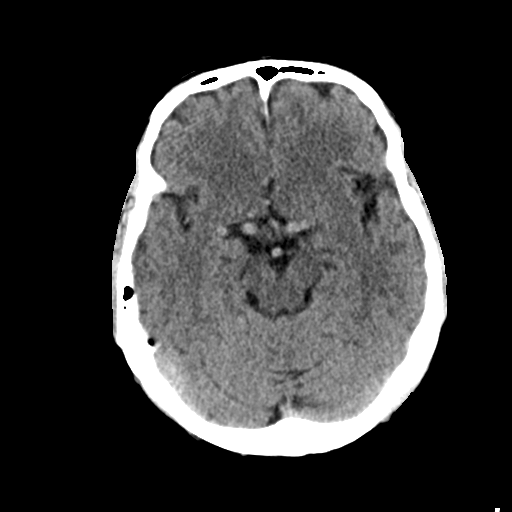
[im 18/35  brain]
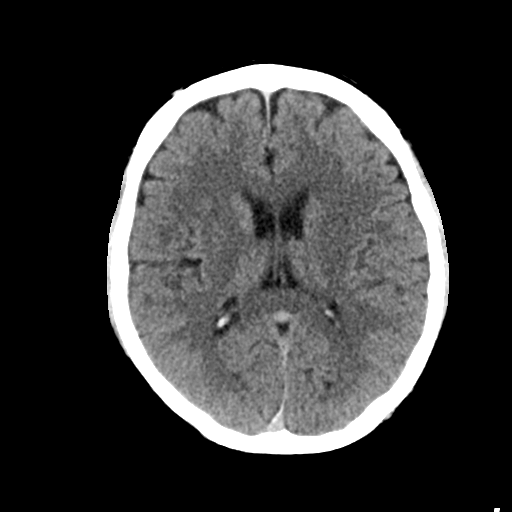
[im 22/35  brain]
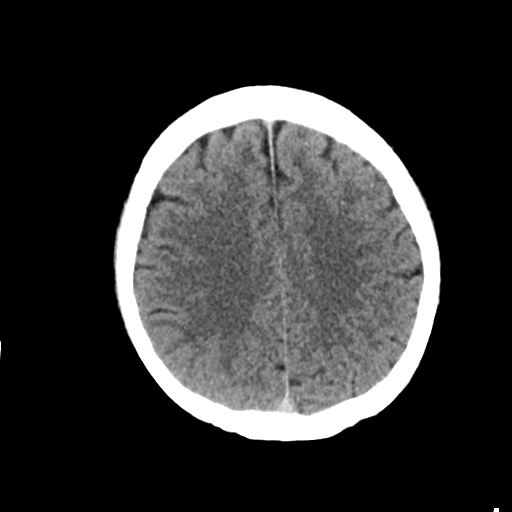
[im 22/35  bone]
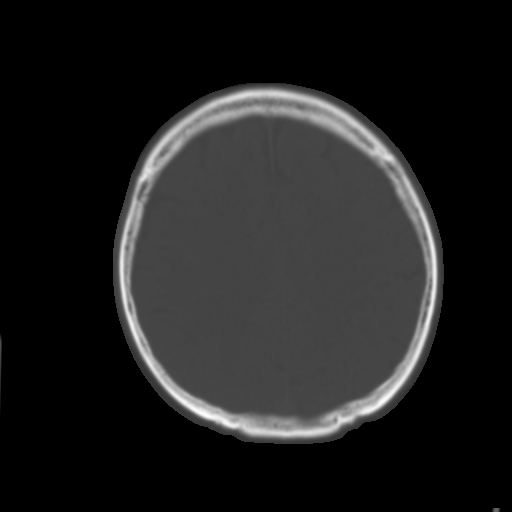
[im 26/35  brain]
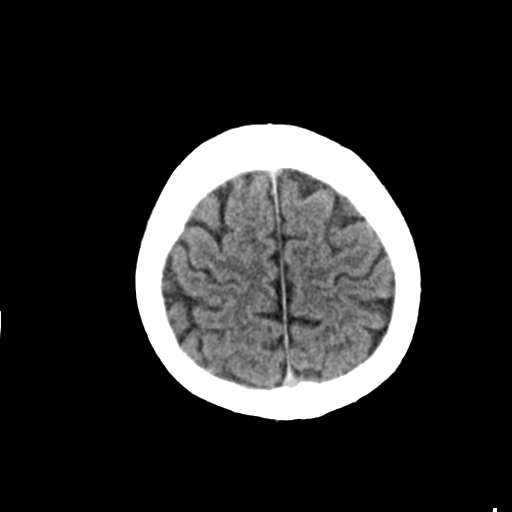
[im 30/35  brain]
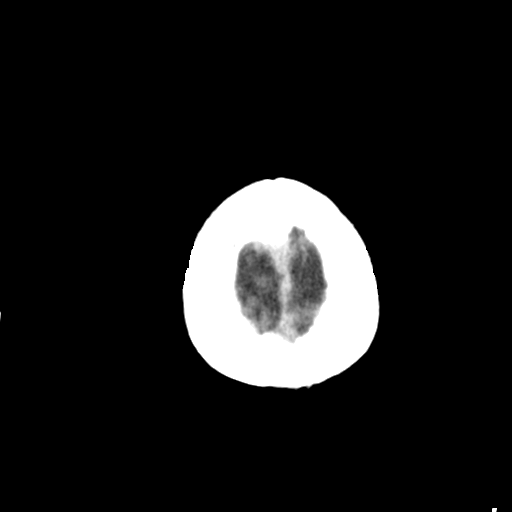

[Series 3: head bone · axial · 0.44mm/px · z∈[-140,-106]mm · 3 of 87 slices shown]
[im 9/87  bone]
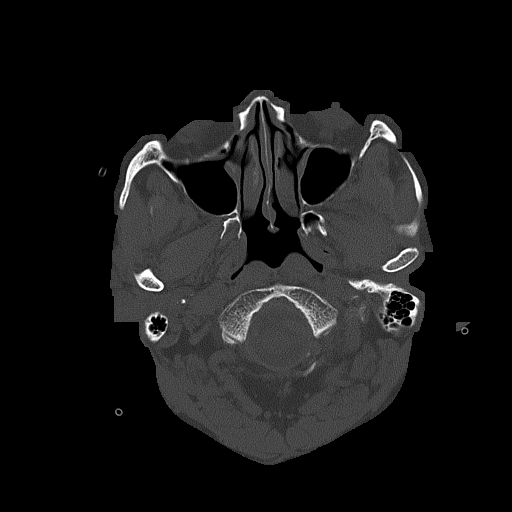
[im 18/87  bone]
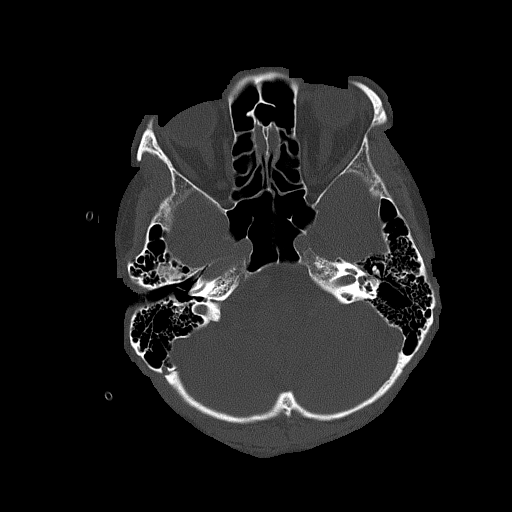
[im 26/87  bone]
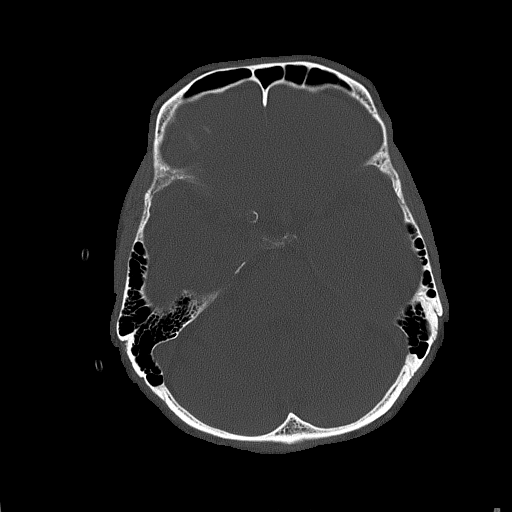

[Series 4: head without cor · coronal · non-contrast · 0.37mm/px · 3 of 68 slices shown]
[im 23/68  brain]
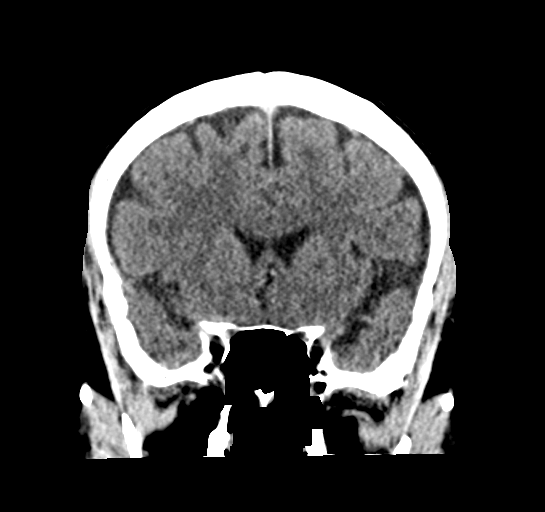
[im 30/68  brain]
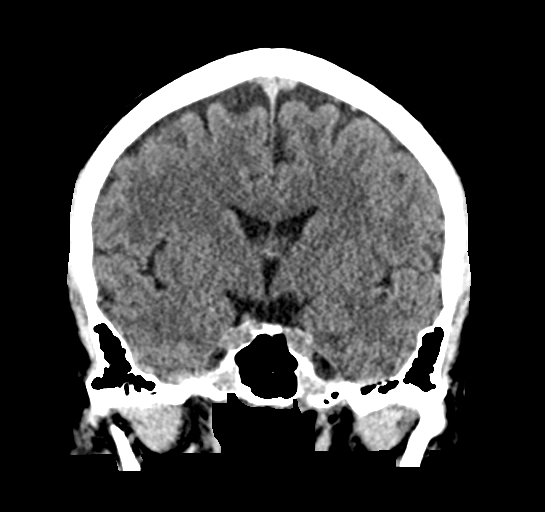
[im 38/68  brain]
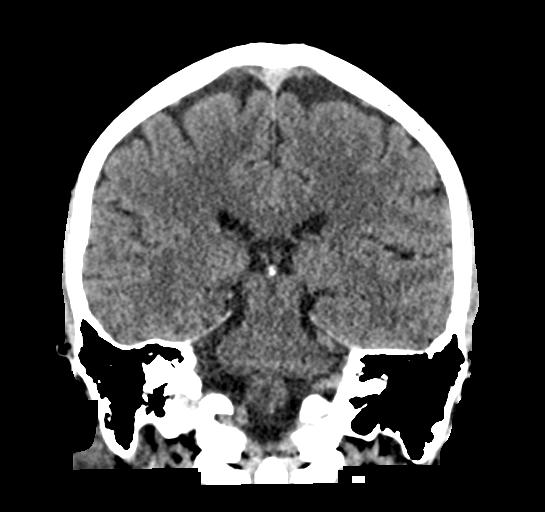

[Series 5: head without sag · sagittal · non-contrast · 0.34mm/px · 3 of 67 slices shown]
[im 23/67  brain]
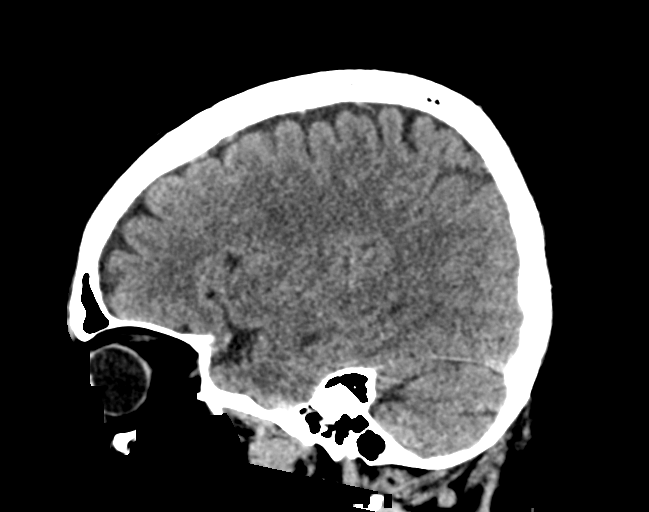
[im 34/67  brain]
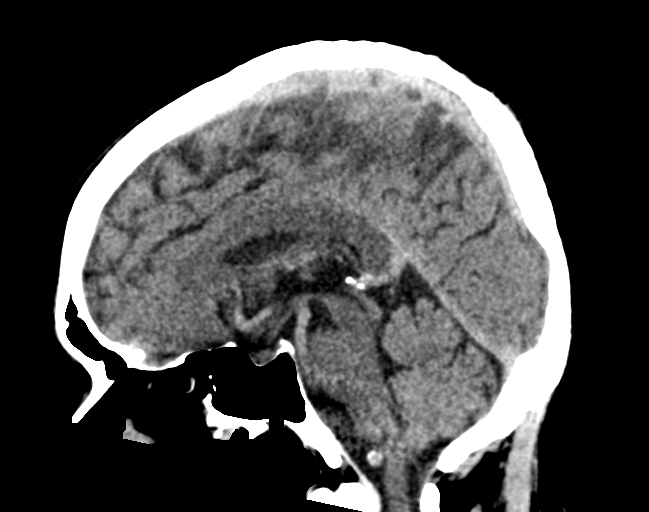
[im 45/67  brain]
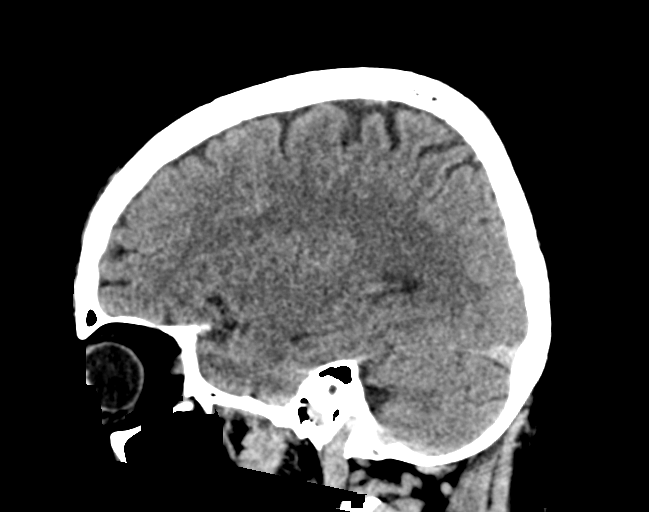

[16 of 47 positions shown; findings below may reference images not displayed]

FINDINGS: Brain: Ventricles are normal in size and configuration. All areas of
the brain demonstrate appropriate gray-white matter differentiation.
There is no mass, hemorrhage, edema or other evidence of acute
parenchymal abnormality. No extra-axial hemorrhage.

Vascular: Chronic calcified atherosclerotic changes of the large
vessels at the skull base. No unexpected hyperdense vessel.

Skull: Normal. Negative for fracture or focal lesion.

Sinuses/Orbits: No acute finding.

Other: None.
IMPRESSION: Negative head CT.  No intracranial mass, hemorrhage or edema.

## 2019-06-06 ENCOUNTER — Other Ambulatory Visit: Payer: Self-pay | Admitting: Endocrinology

## 2019-06-06 ENCOUNTER — Other Ambulatory Visit: Payer: Self-pay | Admitting: Family Medicine

## 2019-06-06 DIAGNOSIS — L409 Psoriasis, unspecified: Secondary | ICD-10-CM

## 2019-06-17 ENCOUNTER — Other Ambulatory Visit: Payer: Self-pay | Admitting: Family Medicine

## 2019-06-17 DIAGNOSIS — F3341 Major depressive disorder, recurrent, in partial remission: Secondary | ICD-10-CM

## 2019-06-17 DIAGNOSIS — F419 Anxiety disorder, unspecified: Secondary | ICD-10-CM

## 2019-06-28 ENCOUNTER — Other Ambulatory Visit: Payer: Self-pay | Admitting: Family Medicine

## 2019-06-28 DIAGNOSIS — G4700 Insomnia, unspecified: Secondary | ICD-10-CM

## 2019-07-24 ENCOUNTER — Other Ambulatory Visit: Payer: Self-pay | Admitting: Family Medicine

## 2019-07-24 DIAGNOSIS — F419 Anxiety disorder, unspecified: Secondary | ICD-10-CM

## 2019-07-24 DIAGNOSIS — F3341 Major depressive disorder, recurrent, in partial remission: Secondary | ICD-10-CM

## 2019-09-11 ENCOUNTER — Other Ambulatory Visit: Payer: Self-pay | Admitting: Family Medicine

## 2019-09-11 DIAGNOSIS — G4700 Insomnia, unspecified: Secondary | ICD-10-CM

## 2019-09-23 ENCOUNTER — Other Ambulatory Visit: Payer: Self-pay | Admitting: Endocrinology

## 2019-09-24 DIAGNOSIS — L219 Seborrheic dermatitis, unspecified: Secondary | ICD-10-CM | POA: Diagnosis not present

## 2019-09-24 DIAGNOSIS — L700 Acne vulgaris: Secondary | ICD-10-CM | POA: Diagnosis not present

## 2019-10-01 DIAGNOSIS — E113293 Type 2 diabetes mellitus with mild nonproliferative diabetic retinopathy without macular edema, bilateral: Secondary | ICD-10-CM | POA: Diagnosis not present

## 2019-10-01 LAB — HM DIABETES EYE EXAM

## 2019-10-07 ENCOUNTER — Encounter: Payer: Self-pay | Admitting: Endocrinology

## 2019-10-15 ENCOUNTER — Other Ambulatory Visit: Payer: Self-pay

## 2019-10-15 ENCOUNTER — Encounter: Payer: Self-pay | Admitting: Endocrinology

## 2019-10-15 ENCOUNTER — Ambulatory Visit (INDEPENDENT_AMBULATORY_CARE_PROVIDER_SITE_OTHER): Payer: Medicare Other | Admitting: Endocrinology

## 2019-10-15 VITALS — BP 160/100 | HR 102 | Ht 65.0 in | Wt 126.0 lb

## 2019-10-15 DIAGNOSIS — E119 Type 2 diabetes mellitus without complications: Secondary | ICD-10-CM

## 2019-10-15 DIAGNOSIS — B181 Chronic viral hepatitis B without delta-agent: Secondary | ICD-10-CM

## 2019-10-15 LAB — POCT GLYCOSYLATED HEMOGLOBIN (HGB A1C): Hemoglobin A1C: 6.1 % — AB (ref 4.0–5.6)

## 2019-10-15 NOTE — Patient Instructions (Addendum)
Please continue the same other medications.  Your blood pressure is high today.  Please see your primary care provider soon, to have it rechecked.  If she recommends medication for this, you should take it.    check your blood sugar 4 times a day.  vary the time of day when you check, between before the 3 meals, and at bedtime.  also check if you have symptoms of your blood sugar being too high or too low.  please keep a record of the readings and bring it to your next appointment here (or you can bring the meter itself).  You can write it on any piece of paper.  please call us sooner if your blood sugar goes below 70, or if you have a lot of readings over 200.  Please see a liver specialist.  you will receive a phone call, about a day and time for an appointment Please come back for a follow-up appointment in 6 months.

## 2019-10-15 NOTE — Progress Notes (Signed)
Subjective:    Patient ID: [name], male    DOB: 30-Dec-1955, 64 y.o.   MRN: [id number]  HPI Pt returns for f/u of diabetes mellitus: DM type: 2, but he is presumed to be evolving type 1, given lean body habitus, MG, psoriasis, and neg FHx of DM.   Dx'ed: 7673 Complications: none Therapy: 4 oral meds DKA: never Severe hypoglycemia: never.  Pancreatitis: never.  Other: he has never been on insulin.  Interval history: He says cbg's vary from 80-235.  pt states he feels well in general.  Prednisone has been reduced to 10 mg QD.  Pt says he can no longer obtain colestipol.   Pt has had fluctuating levels of Ca++ and PTH (sometimes low, sometimes normal; since 2015; vit-D has been persistently normal on rx; these were normal: vit-A, PTH-RP, Mg++, and 24-HR urine Ca++).  He takes vit-D, 2000 units/day.  pt states he feels well in general.   Past Medical History:  Diagnosis Date  . Anxiety   . Colon polyp   . Depressive disorder, not elsewhere classified   . External hemorrhoid   . Insomnia, unspecified   . Internal hemorrhoids   . Myasthenia gravis without exacerbation ([place])   . Other and unspecified hyperlipidemia   . 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 Type II or unspecified type diabetes mellitus without mention of complication, not stated as uncontrolled   . Viral hepatitis B without mention of hepatic coma, chronic, without mention of hepatitis delta     Past Surgical History:  Procedure Laterality Date  . THYMECTOMY      Social History   Socioeconomic History  . Marital status: Married    Spouse name: Not on file  . Number of children: Not on file  . Years of education: Not on file  . Highest education level: Not on file  Occupational History  . Not on file  Tobacco Use  . Smoking status: Never Smoker  . Smokeless tobacco: Never Used  Substance and Sexual Activity  . Alcohol use: No  . Drug use: No  . Sexual activity: Not on file  Other  Topics Concern  . Not on file  Social History Narrative  . Not on file   Social Determinants of Health   Financial Resource Strain:   . Difficulty of Paying Living Expenses: Not on file  Food Insecurity:   . Worried About Charity fundraiser in the Last Year: Not on file  . Ran Out of Food in the Last Year: Not on file  Transportation Needs:   . Lack of Transportation (Medical): Not on file  . Lack of Transportation (Non-Medical): Not on file  Physical Activity:   . Days of Exercise per Week: Not on file  . Minutes of Exercise per Session: Not on file  Stress:   . Feeling of Stress : Not on file  Social Connections:   . Frequency of Communication with Friends and Family: Not on file  . Frequency of Social Gatherings with Friends and Family: Not on file  . Attends Religious Services: Not on file  . Active Member of Clubs or Organizations: Not on file  . Attends Archivist Meetings: Not on file  . Marital Status: Not on file  Intimate Partner Violence:   . Fear of Current or Ex-Partner: Not on file  . Emotionally Abused: Not on file  . Physically Abused: Not on file  . Sexually Abused: Not on file  Current Outpatient Medications on File Prior to Visit  Medication Sig Dispense Refill  . predniSONE (DELTASONE) 5 MG tablet Take 5 mg by mouth as directed.    . calcium carbonate (OS-CAL) 600 MG TABS Take 600 mg by mouth 3 (three) times daily with meals.      . Cholecalciferol (VITAMIN D) 2000 UNITS tablet Take 2,000 Units by mouth daily.      . colesevelam (WELCHOL) 625 MG tablet Take 3 tablets (1,875 mg total) by mouth daily. 270 tablet 3  . desonide (DESONATE) 0.05 % gel Apply 1 application topically 2 (two) times daily as needed.     . entecavir (BARACLUDE) 1 MG tablet Take 1 tablet (1 mg total) by mouth daily. 30 tablet 6  . fluocinonide cream (LIDEX) 4.25 % Apply 1 application topically 2 (two) times daily. 45 g 1  . FLUoxetine (PROZAC) 20 MG capsule TAKE ONE  CAPSULE BY MOUTH DAILY 30 capsule 0  . gabapentin (NEURONTIN) 300 MG capsule Take 1 capsule (300 mg total) by mouth 3 (three) times daily. 30 capsule 1  . glucose blood test strip USE TO TEST BLOOD SUGAR THREE TIMES A DAY 100 each 2  . ketoconazole (NIZORAL) 2 % cream Apply 1 application topically 2 (two) times daily as needed for irritation.     . Lancets (ONETOUCH ULTRASOFT) lancets USE AS INSTRUCTED TO CHECK BLOOD SUGARS THREE TIMES A DAY 200 each 3  . metFORMIN (GLUCOPHAGE-XR) 500 MG 24 hr tablet TAKE TWO TABLETS BY MOUTH TWICE A DAY 360 tablet 0  . omeprazole (PRILOSEC) 20 MG capsule TAKE ONE CAPSULE BY MOUTH DAILY 90 capsule 3  . pimecrolimus (ELIDEL) 1 % cream Apply 1 application topically 2 (two) times daily as needed.     . predniSONE (DELTASONE) 5 MG tablet Take 3 tablets (15 mg total) by mouth every other day. 30 tablet 0  . pyridostigmine (MESTINON) 60 MG tablet Take 60 mg by mouth 3 (three) times daily.     . repaglinide (PRANDIN) 0.5 MG tablet TAKE ONE TABLET BY MOUTH THREE TIMES A DAY BEFORE MEALS. ** TAKE ONLY IF BLOOD SUGAR IS OVER 200 ** 270 tablet 10  . sitaGLIPtin (JANUVIA) 100 MG tablet Take 1 tablet (100 mg total) by mouth daily. 90 tablet 3  . triamcinolone cream (KENALOG) 0.1 % Apply 1 application topically 2 (two) times daily as needed.     . zolpidem (AMBIEN) 10 MG tablet TAKE ONE TABLET BY MOUTH EVERY NIGHT AT BEDTIME AS NEEDED 30 tablet 1   No current facility-administered medications on file prior to visit.    Allergies  Allergen Reactions  . Azithromycin Other (See Comments)    REACTION: exac of his mg  . Beta Adrenergic Blockers Other (See Comments)    REACTION: exac of mg    No family history on file.  BP (!) 160/100   Pulse (!) 102   Ht 5\' 5"  (1.651 m)   Wt 126 lb (57.2 kg)   SpO2 96%   BMI 20.97 kg/m    Review of Systems     Objective:   Physical Exam VITAL SIGNS:  See vs page GENERAL: no distress Pulses: dorsalis pedis intact bilat.    MSK: no deformity of the feet CV: no leg edema Skin:  no ulcer on the feet.  normal color and temp on the feet. Neuro: sensation is intact to touch on the feet   Lab Results  Component Value Date   HGBA1C 6.1 (A) 10/15/2019  Assessment & Plan:  Your blood pressure is high today.  Please see your primary care provider soon, to have it rechecked Chronic hep B. Type 2 DM: well-controlled.  Please continue the same 4 DM medications  Patient Instructions  Please continue the same other medications.  Your blood pressure is high today.  Please see your primary care provider soon, to have it rechecked.  If she recommends medication for this, you should take it.    check your blood sugar 4 times a day.  vary the time of day when you check, between before the 3 meals, and at bedtime.  also check if you have symptoms of your blood sugar being too high or too low.  please keep a record of the readings and bring it to your next appointment here (or you can bring the meter itself).  You can write it on any piece of paper.  please call us sooner if your blood sugar goes below 70, or if you have a lot of readings over 200.  Please see a liver specialist.  you will receive a phone call, about a day and time for an appointment Please come back for a follow-up appointment in 6 months.

## 2019-10-19 ENCOUNTER — Other Ambulatory Visit: Payer: Self-pay

## 2019-10-19 ENCOUNTER — Emergency Department (HOSPITAL_COMMUNITY)
Admission: EM | Admit: 2019-10-19 | Discharge: 2019-10-19 | Disposition: A | Discharge status: Home / Self Care | Payer: Medicare Other | Attending: Emergency Medicine | Admitting: Emergency Medicine

## 2019-10-19 ENCOUNTER — Encounter (HOSPITAL_COMMUNITY): Payer: Self-pay | Admitting: Emergency Medicine

## 2019-10-19 DIAGNOSIS — T7840XA Allergy, unspecified, initial encounter: Secondary | ICD-10-CM | POA: Insufficient documentation

## 2019-10-19 DIAGNOSIS — Z5321 Procedure and treatment not carried out due to patient leaving prior to being seen by health care provider: Secondary | ICD-10-CM | POA: Insufficient documentation

## 2019-10-19 NOTE — ED Notes (Signed)
Pt stated he feels better so he is going home and will call his PCP

## 2019-10-19 NOTE — ED Triage Notes (Signed)
Pt to ED with c/o allergic reaction to something.  Pt st's approx 1 hour ago he started breaking out in hives and itching all over with swelling to mouth and lips.  Pt st's symptoms are some better at this time

## 2019-10-27 ENCOUNTER — Other Ambulatory Visit: Payer: Self-pay

## 2019-10-27 ENCOUNTER — Encounter: Payer: Self-pay | Admitting: Family Medicine

## 2019-10-27 ENCOUNTER — Ambulatory Visit (INDEPENDENT_AMBULATORY_CARE_PROVIDER_SITE_OTHER): Payer: Medicare Other | Admitting: Family Medicine

## 2019-10-27 VITALS — BP 140/98 | HR 113 | Resp 12 | Ht 65.0 in | Wt 125.2 lb

## 2019-10-27 DIAGNOSIS — L509 Urticaria, unspecified: Secondary | ICD-10-CM | POA: Diagnosis not present

## 2019-10-27 DIAGNOSIS — I10 Essential (primary) hypertension: Secondary | ICD-10-CM | POA: Diagnosis not present

## 2019-10-27 DIAGNOSIS — E7800 Pure hypercholesterolemia, unspecified: Secondary | ICD-10-CM | POA: Diagnosis not present

## 2019-10-27 DIAGNOSIS — G4700 Insomnia, unspecified: Secondary | ICD-10-CM

## 2019-10-27 DIAGNOSIS — F3341 Major depressive disorder, recurrent, in partial remission: Secondary | ICD-10-CM | POA: Diagnosis not present

## 2019-10-27 DIAGNOSIS — R519 Headache, unspecified: Secondary | ICD-10-CM

## 2019-10-27 DIAGNOSIS — R000 Tachycardia, unspecified: Secondary | ICD-10-CM | POA: Diagnosis not present

## 2019-10-27 MED ORDER — DILTIAZEM HCL ER 60 MG PO CP12: 60.0000 mg | ORAL_CAPSULE | Freq: Two times a day (BID) | ORAL | 2 refills | Status: DC

## 2019-10-27 NOTE — Progress Notes (Signed)
HPI: [name] is a 64 y.o. male, who is here today for chronic disease management.  He was last seen on 01/28/2019.  Today he is concerned about elevated BP readings during recent OV and ER visit. History of hypertension, he is on no pharmacologic treatment. He does not check BP at home. His BP has been elevated.  He also has history of sinus tachycardia. He states that can feel when his HR is high. He is not sure about exacerbating or alleviating factors. ?  Anxiety.  Negative for associated CP, dyspnea, diaphoresis, or dizziness.   Ref Range & Units 5 mo ago  Sodium 135 - 145 mmol/L 139      Potassium 3.5 - 5.0 mmol/L 3.7      Chloride 98 - 107 mmol/L 101      Anion Gap 7 - 15 mmol/L 7      CO2 22.0 - 30.0 mmol/L 31.0High      BUN 7 - 21 mg/dL 13      Creatinine 0.70 - 1.30 mg/dL 0.90      BUN/Creatinine Ratio  14      EGFR CKD-EPI Non-African American, Male >=60 mL/min/1.81m >90      EGFR CKD-EPI African American, Male >=60 mL/min/1.724m>90      Glucose 70 - 179 mg/dL 100      Calcium 8.5 - 10.2 mg/dL 9.6      Albumin 3.5 - 5.0 g/dL 4.6      Total Protein 6.5 - 8.3 g/dL 7.2      Total Bilirubin 0.0 - 1.2 mg/dL 0.6      AST 19 - 55 U/L 23      ALT <50 U/L 10      Alkaline Phosphatase 38 - 126 U/L    Hyperlipidemia: Currently he is on WelChol 625 mg 3 tablets daily. He is not on a statin medication.  Lab Results  Component Value Date   CHOL 170 12/25/2016   HDL 48.40 12/25/2016   LDLCALC 68 07/23/2008   LDLDIRECT 87.0 12/25/2016   TRIG 209.0 (H) 12/25/2016   CHOLHDL 4 12/25/2016   He was in the ER because "allergic reaction", he thinks it was food allergy, no new food. He left the ER before he was seen. He had hives all over. He did not take medication,resolved spontaneously while waiting in the ER. Negative for new medications, food intake, outdoor exposure, or insect bites. He has no history of similar episode in the  past.  Insomnia: Sleeps about 6 hours. He is on Ambien 10 mg as needed, usually every 2 days. Depression and anxiety: He is taking fluoxetine 20 mg daily as needed, about 2-3 times per week.  He takes it when he cannot sleep or feeling anxious. Negative for suicidal thoughts.  He is having intermittent episodes of occipital headache at night and early morning.  Problem has been going on for about 2 months, Advil helps. Pain is mild. States that he has same pain every year around this time.  Radiculopathy: RUE pain resolved with Gabapentin 300 mg .  He is no longer taking gabapentin. Cervical MRI 12/24/18: IMPRESSION: 1. Soft disc extrusion at C5-6 to the right of midline which could affect the right C6 and C7 nerves. 2. Slight bilateral foraminal narrowing at C6-7.  Review of Systems  Constitutional: Positive for fatigue. Negative for activity change, appetite change and fever.  HENT: Negative for nosebleeds, sore throat and trouble swallowing.   Eyes: Negative  for redness and visual disturbance.  Respiratory: Negative for cough and wheezing.   Cardiovascular: Negative for leg swelling.  Gastrointestinal: Negative for abdominal pain, nausea and vomiting.  Genitourinary: Negative for decreased urine volume, dysuria and hematuria.  Musculoskeletal: Negative for gait problem.  Skin: Negative for rash and wound.  Neurological: Negative for syncope, facial asymmetry and weakness.  Psychiatric/Behavioral: Positive for sleep disturbance. Negative for confusion. The patient is nervous/anxious.   Rest of ROS, see pertinent positives sand negatives in HPI  Current Outpatient Medications on File Prior to Visit  Medication Sig Dispense Refill  . calcium carbonate (OS-CAL) 600 MG TABS Take 600 mg by mouth 3 (three) times daily with meals.      . Cholecalciferol (VITAMIN D) 2000 UNITS tablet Take 2,000 Units by mouth daily.      . colesevelam (WELCHOL) 625 MG tablet Take 3 tablets (1,875 mg  total) by mouth daily. 270 tablet 3  . desonide (DESONATE) 0.05 % gel Apply 1 application topically 2 (two) times daily as needed.     . entecavir (BARACLUDE) 1 MG tablet Take 1 tablet (1 mg total) by mouth daily. 30 tablet 6  . fluocinonide cream (LIDEX) 4.09 % Apply 1 application topically 2 (two) times daily. 45 g 1  . FLUoxetine (PROZAC) 20 MG capsule TAKE ONE CAPSULE BY MOUTH DAILY 30 capsule 0  . glucose blood test strip USE TO TEST BLOOD SUGAR THREE TIMES A DAY 100 each 2  . ketoconazole (NIZORAL) 2 % cream Apply 1 application topically 2 (two) times daily as needed for irritation.     . Lancets (ONETOUCH ULTRASOFT) lancets USE AS INSTRUCTED TO CHECK BLOOD SUGARS THREE TIMES A DAY 200 each 3  . metFORMIN (GLUCOPHAGE-XR) 500 MG 24 hr tablet TAKE TWO TABLETS BY MOUTH TWICE A DAY 360 tablet 0  . omeprazole (PRILOSEC) 20 MG capsule TAKE ONE CAPSULE BY MOUTH DAILY 90 capsule 3  . pimecrolimus (ELIDEL) 1 % cream Apply 1 application topically 2 (two) times daily as needed.     . predniSONE (DELTASONE) 5 MG tablet Take 3 tablets (15 mg total) by mouth every other day. 30 tablet 0  . predniSONE (DELTASONE) 5 MG tablet Take 5 mg by mouth as directed.    . pyridostigmine (MESTINON) 60 MG tablet Take 60 mg by mouth 3 (three) times daily.     . repaglinide (PRANDIN) 0.5 MG tablet TAKE ONE TABLET BY MOUTH THREE TIMES A DAY BEFORE MEALS. ** TAKE ONLY IF BLOOD SUGAR IS OVER 200 ** 270 tablet 10  . sitaGLIPtin (JANUVIA) 100 MG tablet Take 1 tablet (100 mg total) by mouth daily. 90 tablet 3  . triamcinolone cream (KENALOG) 0.1 % Apply 1 application topically 2 (two) times daily as needed.     . zolpidem (AMBIEN) 10 MG tablet TAKE ONE TABLET BY MOUTH EVERY NIGHT AT BEDTIME AS NEEDED 30 tablet 1   No current facility-administered medications on file prior to visit.     Past Medical History:  Diagnosis Date  . Anxiety   . Colon polyp   . Depressive disorder, not elsewhere classified   . External  hemorrhoid   . Insomnia, unspecified   . Internal hemorrhoids   . Myasthenia gravis without exacerbation ([place])   . Other and unspecified hyperlipidemia   . 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 Type II or unspecified type diabetes mellitus without mention of complication, not stated as uncontrolled   . 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 Azithromycin Other (See Comments)    REACTION: exac of his mg  . Beta Adrenergic Blockers Other (See Comments)    REACTION: exac of mg    Social History   Socioeconomic History  . Marital status: Married    Spouse name: Not on file  . Number of children: Not on file  . Years of education: Not on file  . Highest education level: Not on file  Occupational History  . Not on file  Tobacco Use  . Smoking status: Never Smoker  . Smokeless tobacco: Never Used  Substance and Sexual Activity  . Alcohol use: No  . Drug use: No  . Sexual activity: Not on file  Other Topics Concern  . Not on file  Social History Narrative  . Not on file   Social Determinants of Health   Financial Resource Strain:   . Difficulty of Paying Living Expenses: Not on file  Food Insecurity:   . Worried About Charity fundraiser in the Last Year: Not on file  . Ran Out of Food in the Last Year: Not on file  Transportation Needs:   . Lack of Transportation (Medical): Not on file  . Lack of Transportation (Non-Medical): Not on file  Physical Activity:   . Days of Exercise per Week: Not on file  . Minutes of Exercise per Session: Not on file  Stress:   . Feeling of Stress : Not on file  Social Connections:   . Frequency of Communication with Friends and Family: Not on file  . Frequency of Social Gatherings with Friends and Family: Not on file  . Attends Religious Services: Not on file  . Active Member of Clubs or Organizations: Not on file  . Attends  Archivist Meetings: Not on file  . Marital Status: Not on file    Vitals:   10/27/19 1153  BP: (!) 140/98  Pulse: (!) 113  Resp: 12  SpO2: 99%   Body mass index is 20.84 kg/m.  Physical Exam Nursing note reviewed.  Constitutional:      General: He is not in acute distress.    Appearance: He is well-developed and well-groomed.  HENT:     Head: Normocephalic and atraumatic.  Eyes:     Conjunctiva/sclera: Conjunctivae normal.     Pupils: Pupils are equal, round, and reactive to light.  Cardiovascular:     Rate and Rhythm: Regular rhythm. Tachycardia present.     Pulses:          Dorsalis pedis pulses are 2+ on the right side and 2+ on the left side.     Heart sounds: No murmur heard.   Pulmonary:     Effort: Pulmonary effort is normal. No respiratory distress.     Breath sounds: Normal breath sounds.  Abdominal:     Palpations: Abdomen is soft. There is no hepatomegaly or mass.     Tenderness: There is no abdominal tenderness.  Musculoskeletal:     Cervical back: No tenderness or bony tenderness. Decreased range of motion (Minimal).     Thoracic back: No tenderness or bony tenderness.  Lymphadenopathy:     Cervical: No cervical adenopathy.  Skin:    General: Skin is warm.     Findings: No erythema or rash.  Neurological:     Mental Status: He is alert and oriented to person, place, and time.  Cranial Nerves: No cranial nerve deficit.     Gait: Gait normal.  Psychiatric:        Mood and Affect: Affect normal. Mood is anxious.     Comments: Well groomed, good eye contact.     ASSESSMENT AND PLAN:  Mr. Ayyan Sites was seen today for chronic disease management.    Diagnoses and all orders for this visit:  Occipital headache We discussed possible etiologies. ?  Tension headache. Problem seems to be stable for years. Recommend mentioning problem to his neurologist. Instructed about warning signs.  Urticaria Resolved. Unknown etiology. If  problem becomes recurrent, we may need to arrange appointment with immunologist.  HYPERCHOLESTEROLEMIA He is not fasting today. We will plan on checking lipid panel next visit. Continue WelChol 650 mg 3 tablets daily.   Hypertension, essential, benign BP is not adequately controlled. BB will be a good option for him to help with sinus tach but there is a hx of beta adrenergic blokers allergies.So Diltiazem 60 mg bid recommended. Monitor BP regularly. Low salt diet. Caution with NSAID's.  Sinus tachycardia We discussed a few treatment options, given the fact he has history of beta-adrenergic blockers allergies, recommend diltiazem 60 mg twice daily. We discussed some side effects. Instructed to monitor HR at home.  INSOMNIA Problem is a stable and Ambien 10 mg at bedtime if needed helps. No changes in current management. Good sleep hygiene.  Major depression in partial remission (HCC) Problem seems to be stable. Insomnia could be part of this problem. Instructed to take fluoxetine 20 mg daily instead prn.   Spent 45 minutes with pt.  During this time history was obtained and documented, examination was performed, prior labs/imaging reviewed, and assessment/plan discussed.  He does not want to follow sooner. He will see his neurologist at [facility] in 2 days.  Return in about 3 months (around 01/26/2020).   G. Martinique, MD  [facility]. [place] office.   A few things to remember from today's visit:  Hypertension, essential, benign  Recurrent major depressive disorder, in partial remission ([place]), Chronic  HYPERCHOLESTEROLEMIA  Insomnia, unspecified type  Occipital headache  ? Tension headache.Please mention this to your neurologist. Fluoxetine is to take daily. Ambien as needed. Diltiazem 60 mg started to for blood pressure and heart rate. You need to monitor blood pressure and heart rate at home.  Ypu can take Tylenol 500 mg at bedtime.Your liver  test was normal, you can take up to 4 tabs per day but recommend 2 instead.   DASH Eating Plan DASH stands for "Dietary Approaches to Stop Hypertension." The DASH eating plan is a healthy eating plan that has been shown to reduce high blood pressure (hypertension). It may also reduce your risk for type 2 diabetes, heart disease, and stroke. The DASH eating plan may also help with weight loss. What are tips for following this plan?  General guidelines  Avoid eating more than 2,300 mg (milligrams) of salt (sodium) a day. If you have hypertension, you may need to reduce your sodium intake to 1,500 mg a day.  Limit alcohol intake to no more than 1 drink a day for nonpregnant women and 2 drinks a day for men. One drink equals 12 oz of beer, 5 oz of wine, or 1 oz of hard liquor.  Work with your health care provider to maintain a healthy body weight or to lose weight. Ask what an ideal weight is for you.  Get at least 30 minutes of exercise  that causes your heart to beat faster (aerobic exercise) most days of the week. Activities may include walking, swimming, or biking.  Work with your health care provider or diet and nutrition specialist (dietitian) to adjust your eating plan to your individual calorie needs. Reading food labels   Check food labels for the amount of sodium per serving. Choose foods with less than 5 percent of the Daily Value of sodium. Generally, foods with less than 300 mg of sodium per serving fit into this eating plan.  To find whole grains, look for the word "whole" as the first word in the ingredient list. Shopping  Buy products labeled as "low-sodium" or "no salt added."  Buy fresh foods. Avoid canned foods and premade or frozen meals. Cooking  Avoid adding salt when cooking. Use salt-free seasonings or herbs instead of table salt or sea salt. Check with your health care provider or pharmacist before using salt substitutes.  Do not fry foods. Cook foods using  healthy methods such as baking, boiling, grilling, and broiling instead.  Cook with heart-healthy oils, such as olive, canola, soybean, or sunflower oil. Meal planning  Eat a balanced diet that includes: ? 5 or more servings of fruits and vegetables each day. At each meal, try to fill half of your plate with fruits and vegetables. ? Up to 6-8 servings of whole grains each day. ? Less than 6 oz of lean meat, poultry, or fish each day. A 3-oz serving of meat is about the same size as a deck of cards. One egg equals 1 oz. ? 2 servings of low-fat dairy each day. ? A serving of nuts, seeds, or beans 5 times each week. ? Heart-healthy fats. Healthy fats called Omega-3 fatty acids are found in foods such as flaxseeds and coldwater fish, like sardines, salmon, and mackerel.  Limit how much you eat of the following: ? Canned or prepackaged foods. ? Food that is high in trans fat, such as fried foods. ? Food that is high in saturated fat, such as fatty meat. ? Sweets, desserts, sugary drinks, and other foods with added sugar. ? Full-fat dairy products.  Do not salt foods before eating.  Try to eat at least 2 vegetarian meals each week.  Eat more home-cooked food and less restaurant, buffet, and fast food.  When eating at a restaurant, ask that your food be prepared with less salt or no salt, if possible. What foods are recommended? The items listed may not be a complete list. Talk with your dietitian about what dietary choices are best for you. Grains Whole-grain or whole-wheat bread. Whole-grain or whole-wheat pasta. Brown rice. [place]. Bulgur. Whole-grain and low-sodium cereals. Pita bread. Low-fat, low-sodium crackers. Whole-wheat flour tortillas. Vegetables Fresh or frozen vegetables (raw, steamed, roasted, or grilled). Low-sodium or reduced-sodium tomato and vegetable juice. Low-sodium or reduced-sodium tomato sauce and tomato paste. Low-sodium or reduced-sodium canned  vegetables. Fruits All fresh, dried, or frozen fruit. Canned fruit in natural juice (without added sugar). Meat and other protein foods Skinless chicken or [place]. Ground chicken or [place]. Pork with fat trimmed off. Fish and seafood. Egg whites. Dried beans, peas, or lentils. Unsalted nuts, nut butters, and seeds. Unsalted canned beans. Lean cuts of beef with fat trimmed off. Low-sodium, lean deli meat. Dairy Low-fat (1%) or fat-free (skim) milk. Fat-free, low-fat, or reduced-fat cheeses. Nonfat, low-sodium ricotta or cottage cheese. Low-fat or nonfat yogurt. Low-fat, low-sodium cheese. Fats and oils Soft margarine without trans fats. Vegetable oil. Low-fat, reduced-fat, or  light mayonnaise and salad dressings (reduced-sodium). Canola, safflower, olive, soybean, and sunflower oils. Avocado. Seasoning and other foods Herbs. Spices. Seasoning mixes without salt. Unsalted popcorn and pretzels. Fat-free sweets. What foods are not recommended? The items listed may not be a complete list. Talk with your dietitian about what dietary choices are best for you. Grains Baked goods made with fat, such as croissants, muffins, or some breads. Dry pasta or rice meal packs. Vegetables Creamed or fried vegetables. Vegetables in a cheese sauce. Regular canned vegetables (not low-sodium or reduced-sodium). Regular canned tomato sauce and paste (not low-sodium or reduced-sodium). Regular tomato and vegetable juice (not low-sodium or reduced-sodium). Angie Fava. Olives. Fruits Canned fruit in a light or heavy syrup. Fried fruit. Fruit in cream or butter sauce. Meat and other protein foods Fatty cuts of meat. Ribs. Fried meat. Berniece Salines. Sausage. Bologna and other processed lunch meats. Salami. Fatback. Hotdogs. Bratwurst. Salted nuts and seeds. Canned beans with added salt. Canned or smoked fish. Whole eggs or egg yolks. Chicken or [place] with skin. Dairy Whole or 2% milk, cream, and half-and-half. Whole or full-fat  cream cheese. Whole-fat or sweetened yogurt. Full-fat cheese. Nondairy creamers. Whipped toppings. Processed cheese and cheese spreads. Fats and oils Butter. Stick margarine. Lard. Shortening. Ghee. Bacon fat. Tropical oils, such as coconut, palm kernel, or palm oil. Seasoning and other foods Salted popcorn and pretzels. Onion salt, garlic salt, seasoned salt, table salt, and sea salt. Worcestershire sauce. Tartar sauce. Barbecue sauce. Teriyaki sauce. Soy sauce, including reduced-sodium. Steak sauce. Canned and packaged gravies. Fish sauce. Oyster sauce. Cocktail sauce. Horseradish that you find on the shelf. Ketchup. Mustard. Meat flavorings and tenderizers. Bouillon cubes. Hot sauce and Tabasco sauce. Premade or packaged marinades. Premade or packaged taco seasonings. Relishes. Regular salad dressings. Where to find more information:  National Heart, Lung, and [place]: https://wilson-eaton.com/  American Heart Association: www.heart.org Summary  The DASH eating plan is a healthy eating plan that has been shown to reduce high blood pressure (hypertension). It may also reduce your risk for type 2 diabetes, heart disease, and stroke.  With the DASH eating plan, you should limit salt (sodium) intake to 2,300 mg a day. If you have hypertension, you may need to reduce your sodium intake to 1,500 mg a day.  When on the DASH eating plan, aim to eat more fresh fruits and vegetables, whole grains, lean proteins, low-fat dairy, and heart-healthy fats.  Work with your health care provider or diet and nutrition specialist (dietitian) to adjust your eating plan to your individual calorie needs. This information is not intended to replace advice given to you by your health care provider. Make sure you discuss any questions you have with your health care provider. Document Revised: 12/29/2016 Document Reviewed: 01/10/2016 Elsevier Patient Education  [employer].   If you need refills please call  your pharmacy. Do not use My Chart to request refills or for acute issues that need immediate attention.    Please be sure medication list is accurate. If a new problem present, please set up appointment sooner than planned today.   How to Take Your Blood Pressure You can take your blood pressure at home with a machine. You may need to check your blood pressure at home:  To check if you have high blood pressure (hypertension).  To check your blood pressure over time.  To make sure your blood pressure medicine is working. Supplies needed: You will need a blood pressure machine, or monitor. You can buy  one at a drugstore or online. When choosing one:  Choose one with an arm cuff.  Choose one that wraps around your upper arm. Only one finger should fit between your arm and the cuff.  Do not choose one that measures your blood pressure from your wrist or finger. Your doctor can suggest a monitor. How to prepare Avoid these things for 30 minutes before checking your blood pressure:  Drinking caffeine.  Drinking alcohol.  Eating.  Smoking.  Exercising. Five minutes before checking your blood pressure:  Pee.  Sit in a dining chair. Avoid sitting in a soft couch or armchair.  Be quiet. Do not talk. How to take your blood pressure Follow the instructions that came with your machine. If you have a digital blood pressure monitor, these may be the instructions: 1. Sit up straight. 2. Place your feet on the floor. Do not cross your ankles or legs. 3. Rest your left arm at the level of your heart. You may rest it on a table, desk, or chair. 4. Pull up your shirt sleeve. 5. Wrap the blood pressure cuff around the upper part of your left arm. The cuff should be 1 inch (2.5 cm) above your elbow. It is best to wrap the cuff around bare skin. 6. Fit the cuff snugly around your arm. You should be able to place only one finger between the cuff and your arm. 7. Put the cord inside the  groove of your elbow. 8. Press the power button. 9. Sit quietly while the cuff fills with air and loses air. 10. Write down the numbers on the screen. 11. Wait 2-3 minutes and then repeat steps 1-10. What do the numbers mean? Two numbers make up your blood pressure. The first number is called systolic pressure. The second is called diastolic pressure. An example of a blood pressure reading is "120 over 80" (or 120/80). If you are an adult and do not have a medical condition, use this guide to find out if your blood pressure is normal: Normal  First number: below 120.  Second number: below 80. Elevated  First number: 120-129.  Second number: below 80. Hypertension stage 1  First number: 130-139.  Second number: 80-89. Hypertension stage 2  First number: 140 or above.  Second number: 59 or above. Your blood pressure is above normal even if only the top or bottom number is above normal. Follow these instructions at home:  Check your blood pressure as often as your doctor tells you to.  Take your monitor to your next doctor's appointment. Your doctor will: ? Make sure you are using it correctly. ? Make sure it is working right.  Make sure you understand what your blood pressure numbers should be.  Tell your doctor if your medicines are causing side effects. Contact a doctor if:  Your blood pressure keeps being high. Get help right away if:  Your first blood pressure number is higher than 180.  Your second blood pressure number is higher than 120. This information is not intended to replace advice given to you by your health care provider. Make sure you discuss any questions you have with your health care provider. Document Revised: 12/29/2016 Document Reviewed: 06/25/2015 Elsevier Patient Education  2020 [employer].

## 2019-10-27 NOTE — Assessment & Plan Note (Signed)
We discussed a few treatment options, given the fact he has history of beta-adrenergic blockers allergies, recommend diltiazem 60 mg twice daily. We discussed some side effects. Instructed to monitor HR at home.

## 2019-10-27 NOTE — Assessment & Plan Note (Addendum)
BP is not adequately controlled. BB will be a good option for him to help with sinus tach but there is a hx of beta adrenergic blokers allergies.So Diltiazem 60 mg bid recommended. Monitor BP regularly. Low salt diet. Caution with NSAID's.

## 2019-10-27 NOTE — Patient Instructions (Addendum)
A few things to remember from today's visit:  Hypertension, essential, benign  Recurrent major depressive disorder, in partial remission ([place]), Chronic  HYPERCHOLESTEROLEMIA  Insomnia, unspecified type  Occipital headache  ? Tension headache.Please mention this to your neurologist. Fluoxetine is to take daily. Ambien as needed. Diltiazem 60 mg started to for blood pressure and heart rate. You need to monitor blood pressure and heart rate at home.  Ypu can take Tylenol 500 mg at bedtime.Your liver test was normal, you can take up to 4 tabs per day but recommend 2 instead.   DASH Eating Plan DASH stands for "Dietary Approaches to Stop Hypertension." The DASH eating plan is a healthy eating plan that has been shown to reduce high blood pressure (hypertension). It may also reduce your risk for type 2 diabetes, heart disease, and stroke. The DASH eating plan may also help with weight loss. What are tips for following this plan?  General guidelines  Avoid eating more than 2,300 mg (milligrams) of salt (sodium) a day. If you have hypertension, you may need to reduce your sodium intake to 1,500 mg a day.  Limit alcohol intake to no more than 1 drink a day for nonpregnant women and 2 drinks a day for men. One drink equals 12 oz of beer, 5 oz of wine, or 1 oz of hard liquor.  Work with your health care provider to maintain a healthy body weight or to lose weight. Ask what an ideal weight is for you.  Get at least 30 minutes of exercise that causes your heart to beat faster (aerobic exercise) most days of the week. Activities may include walking, swimming, or biking.  Work with your health care provider or diet and nutrition specialist (dietitian) to adjust your eating plan to your individual calorie needs. Reading food labels   Check food labels for the amount of sodium per serving. Choose foods with less than 5 percent of the Daily Value of sodium. Generally, foods with less than 300  mg of sodium per serving fit into this eating plan.  To find whole grains, look for the word "whole" as the first word in the ingredient list. Shopping  Buy products labeled as "low-sodium" or "no salt added."  Buy fresh foods. Avoid canned foods and premade or frozen meals. Cooking  Avoid adding salt when cooking. Use salt-free seasonings or herbs instead of table salt or sea salt. Check with your health care provider or pharmacist before using salt substitutes.  Do not fry foods. Cook foods using healthy methods such as baking, boiling, grilling, and broiling instead.  Cook with heart-healthy oils, such as olive, canola, soybean, or sunflower oil. Meal planning  Eat a balanced diet that includes: ? 5 or more servings of fruits and vegetables each day. At each meal, try to fill half of your plate with fruits and vegetables. ? Up to 6-8 servings of whole grains each day. ? Less than 6 oz of lean meat, poultry, or fish each day. A 3-oz serving of meat is about the same size as a deck of cards. One egg equals 1 oz. ? 2 servings of low-fat dairy each day. ? A serving of nuts, seeds, or beans 5 times each week. ? Heart-healthy fats. Healthy fats called Omega-3 fatty acids are found in foods such as flaxseeds and coldwater fish, like sardines, salmon, and mackerel.  Limit how much you eat of the following: ? Canned or prepackaged foods. ? Food that is high in trans fat, such  as fried foods. ? Food that is high in saturated fat, such as fatty meat. ? Sweets, desserts, sugary drinks, and other foods with added sugar. ? Full-fat dairy products.  Do not salt foods before eating.  Try to eat at least 2 vegetarian meals each week.  Eat more home-cooked food and less restaurant, buffet, and fast food.  When eating at a restaurant, ask that your food be prepared with less salt or no salt, if possible. What foods are recommended? The items listed may not be a complete list. Talk with your  dietitian about what dietary choices are best for you. Grains Whole-grain or whole-wheat bread. Whole-grain or whole-wheat pasta. Brown rice. [place]. Bulgur. Whole-grain and low-sodium cereals. Pita bread. Low-fat, low-sodium crackers. Whole-wheat flour tortillas. Vegetables Fresh or frozen vegetables (raw, steamed, roasted, or grilled). Low-sodium or reduced-sodium tomato and vegetable juice. Low-sodium or reduced-sodium tomato sauce and tomato paste. Low-sodium or reduced-sodium canned vegetables. Fruits All fresh, dried, or frozen fruit. Canned fruit in natural juice (without added sugar). Meat and other protein foods Skinless chicken or [place]. Ground chicken or [place]. Pork with fat trimmed off. Fish and seafood. Egg whites. Dried beans, peas, or lentils. Unsalted nuts, nut butters, and seeds. Unsalted canned beans. Lean cuts of beef with fat trimmed off. Low-sodium, lean deli meat. Dairy Low-fat (1%) or fat-free (skim) milk. Fat-free, low-fat, or reduced-fat cheeses. Nonfat, low-sodium ricotta or cottage cheese. Low-fat or nonfat yogurt. Low-fat, low-sodium cheese. Fats and oils Soft margarine without trans fats. Vegetable oil. Low-fat, reduced-fat, or light mayonnaise and salad dressings (reduced-sodium). Canola, safflower, olive, soybean, and sunflower oils. Avocado. Seasoning and other foods Herbs. Spices. Seasoning mixes without salt. Unsalted popcorn and pretzels. Fat-free sweets. What foods are not recommended? The items listed may not be a complete list. Talk with your dietitian about what dietary choices are best for you. Grains Baked goods made with fat, such as croissants, muffins, or some breads. Dry pasta or rice meal packs. Vegetables Creamed or fried vegetables. Vegetables in a cheese sauce. Regular canned vegetables (not low-sodium or reduced-sodium). Regular canned tomato sauce and paste (not low-sodium or reduced-sodium). Regular tomato and vegetable juice (not  low-sodium or reduced-sodium). Angie Fava. Olives. Fruits Canned fruit in a light or heavy syrup. Fried fruit. Fruit in cream or butter sauce. Meat and other protein foods Fatty cuts of meat. Ribs. Fried meat. Berniece Salines. Sausage. Bologna and other processed lunch meats. Salami. Fatback. Hotdogs. Bratwurst. Salted nuts and seeds. Canned beans with added salt. Canned or smoked fish. Whole eggs or egg yolks. Chicken or [place] with skin. Dairy Whole or 2% milk, cream, and half-and-half. Whole or full-fat cream cheese. Whole-fat or sweetened yogurt. Full-fat cheese. Nondairy creamers. Whipped toppings. Processed cheese and cheese spreads. Fats and oils Butter. Stick margarine. Lard. Shortening. Ghee. Bacon fat. Tropical oils, such as coconut, palm kernel, or palm oil. Seasoning and other foods Salted popcorn and pretzels. Onion salt, garlic salt, seasoned salt, table salt, and sea salt. Worcestershire sauce. Tartar sauce. Barbecue sauce. Teriyaki sauce. Soy sauce, including reduced-sodium. Steak sauce. Canned and packaged gravies. Fish sauce. Oyster sauce. Cocktail sauce. Horseradish that you find on the shelf. Ketchup. Mustard. Meat flavorings and tenderizers. Bouillon cubes. Hot sauce and Tabasco sauce. Premade or packaged marinades. Premade or packaged taco seasonings. Relishes. Regular salad dressings. Where to find more information:  National Heart, Lung, and [place]: https://wilson-eaton.com/  American Heart Association: www.heart.org Summary  The DASH eating plan is a healthy eating plan that has been shown  to reduce high blood pressure (hypertension). It may also reduce your risk for type 2 diabetes, heart disease, and stroke.  With the DASH eating plan, you should limit salt (sodium) intake to 2,300 mg a day. If you have hypertension, you may need to reduce your sodium intake to 1,500 mg a day.  When on the DASH eating plan, aim to eat more fresh fruits and vegetables, whole grains, lean proteins,  low-fat dairy, and heart-healthy fats.  Work with your health care provider or diet and nutrition specialist (dietitian) to adjust your eating plan to your individual calorie needs. This information is not intended to replace advice given to you by your health care provider. Make sure you discuss any questions you have with your health care provider. Document Revised: 12/29/2016 Document Reviewed: 01/10/2016 Elsevier Patient Education  [employer].   If you need refills please call your pharmacy. Do not use My Chart to request refills or for acute issues that need immediate attention.    Please be sure medication list is accurate. If a new problem present, please set up appointment sooner than planned today.   How to Take Your Blood Pressure You can take your blood pressure at home with a machine. You may need to check your blood pressure at home:  To check if you have high blood pressure (hypertension).  To check your blood pressure over time.  To make sure your blood pressure medicine is working. Supplies needed: You will need a blood pressure machine, or monitor. You can buy one at a drugstore or online. When choosing one:  Choose one with an arm cuff.  Choose one that wraps around your upper arm. Only one finger should fit between your arm and the cuff.  Do not choose one that measures your blood pressure from your wrist or finger. Your doctor can suggest a monitor. How to prepare Avoid these things for 30 minutes before checking your blood pressure:  Drinking caffeine.  Drinking alcohol.  Eating.  Smoking.  Exercising. Five minutes before checking your blood pressure:  Pee.  Sit in a dining chair. Avoid sitting in a soft couch or armchair.  Be quiet. Do not talk. How to take your blood pressure Follow the instructions that came with your machine. If you have a digital blood pressure monitor, these may be the instructions: 1. Sit up straight. 2. Place  your feet on the floor. Do not cross your ankles or legs. 3. Rest your left arm at the level of your heart. You may rest it on a table, desk, or chair. 4. Pull up your shirt sleeve. 5. Wrap the blood pressure cuff around the upper part of your left arm. The cuff should be 1 inch (2.5 cm) above your elbow. It is best to wrap the cuff around bare skin. 6. Fit the cuff snugly around your arm. You should be able to place only one finger between the cuff and your arm. 7. Put the cord inside the groove of your elbow. 8. Press the power button. 9. Sit quietly while the cuff fills with air and loses air. 10. Write down the numbers on the screen. 11. Wait 2-3 minutes and then repeat steps 1-10. What do the numbers mean? Two numbers make up your blood pressure. The first number is called systolic pressure. The second is called diastolic pressure. An example of a blood pressure reading is "120 over 80" (or 120/80). If you are an adult and do not have a medical  condition, use this guide to find out if your blood pressure is normal: Normal  First number: below 120.  Second number: below 80. Elevated  First number: 120-129.  Second number: below 80. Hypertension stage 1  First number: 130-139.  Second number: 80-89. Hypertension stage 2  First number: 140 or above.  Second number: 17 or above. Your blood pressure is above normal even if only the top or bottom number is above normal. Follow these instructions at home:  Check your blood pressure as often as your doctor tells you to.  Take your monitor to your next doctor's appointment. Your doctor will: ? Make sure you are using it correctly. ? Make sure it is working right.  Make sure you understand what your blood pressure numbers should be.  Tell your doctor if your medicines are causing side effects. Contact a doctor if:  Your blood pressure keeps being high. Get help right away if:  Your first blood pressure number is higher  than 180.  Your second blood pressure number is higher than 120. This information is not intended to replace advice given to you by your health care provider. Make sure you discuss any questions you have with your health care provider. Document Revised: 12/29/2016 Document Reviewed: 06/25/2015 Elsevier Patient Education  2020 [employer].

## 2019-10-27 NOTE — Assessment & Plan Note (Addendum)
He is not fasting today. We will plan on checking lipid panel next visit. Continue WelChol 650 mg 3 tablets daily.

## 2019-10-27 NOTE — Assessment & Plan Note (Signed)
Problem seems to be stable. Insomnia could be part of this problem. Instructed to take fluoxetine 20 mg daily instead prn.

## 2019-10-27 NOTE — Assessment & Plan Note (Signed)
Problem is a stable and Ambien 10 mg at bedtime if needed helps. No changes in current management. Good sleep hygiene.

## 2019-10-29 DIAGNOSIS — D4989 Neoplasm of unspecified behavior of other specified sites: Secondary | ICD-10-CM | POA: Diagnosis not present

## 2019-10-29 DIAGNOSIS — Z79899 Other long term (current) drug therapy: Secondary | ICD-10-CM | POA: Diagnosis not present

## 2019-10-29 DIAGNOSIS — G7000 Myasthenia gravis without (acute) exacerbation: Secondary | ICD-10-CM | POA: Diagnosis not present

## 2019-11-29 ENCOUNTER — Other Ambulatory Visit: Payer: Self-pay | Admitting: Endocrinology

## 2019-12-11 ENCOUNTER — Other Ambulatory Visit: Payer: Self-pay | Admitting: Family Medicine

## 2019-12-11 DIAGNOSIS — G4700 Insomnia, unspecified: Secondary | ICD-10-CM

## 2019-12-11 NOTE — Telephone Encounter (Signed)
Received a refill request for:  Zolpidem 10 mg LR 09/15/19, #30, 0 rf LOV 10/27/19 FOV  None scheduled.    Please review and advise.  Thanks.   Dm/cma

## 2019-12-22 ENCOUNTER — Other Ambulatory Visit: Payer: Self-pay | Admitting: Endocrinology

## 2019-12-23 ENCOUNTER — Encounter: Payer: Self-pay | Admitting: Family Medicine

## 2019-12-23 ENCOUNTER — Other Ambulatory Visit: Payer: Self-pay

## 2019-12-23 ENCOUNTER — Ambulatory Visit (INDEPENDENT_AMBULATORY_CARE_PROVIDER_SITE_OTHER): Payer: Medicare Other | Admitting: Family Medicine

## 2019-12-23 VITALS — BP 132/90 | HR 95 | Temp 98.6°F | Resp 16 | Ht 65.0 in | Wt 123.6 lb

## 2019-12-23 DIAGNOSIS — I10 Essential (primary) hypertension: Secondary | ICD-10-CM

## 2019-12-23 DIAGNOSIS — F3341 Major depressive disorder, recurrent, in partial remission: Secondary | ICD-10-CM

## 2019-12-23 DIAGNOSIS — R000 Tachycardia, unspecified: Secondary | ICD-10-CM | POA: Diagnosis not present

## 2019-12-23 DIAGNOSIS — G4700 Insomnia, unspecified: Secondary | ICD-10-CM

## 2019-12-23 DIAGNOSIS — F419 Anxiety disorder, unspecified: Secondary | ICD-10-CM

## 2019-12-23 MED ORDER — FLUOXETINE HCL 20 MG PO CAPS: 20.0000 mg | ORAL_CAPSULE | Freq: Every day | ORAL | 3 refills | Status: DC

## 2019-12-23 MED ORDER — ZOLPIDEM TARTRATE 10 MG PO TABS: 5.0000 mg | ORAL_TABLET | Freq: Every evening | ORAL | 3 refills | Status: DC | PRN

## 2019-12-23 MED ORDER — DILTIAZEM HCL ER 90 MG PO CP12: 90.0000 mg | ORAL_CAPSULE | Freq: Two times a day (BID) | ORAL | 1 refills | Status: DC

## 2019-12-23 NOTE — Assessment & Plan Note (Signed)
Ambien still helping,so continue 5-10 mg daily as bedtime. Good sleep hygiene.

## 2019-12-23 NOTE — Assessment & Plan Note (Signed)
Problem is stable. Continue Fluoxetine 20 mg daily.

## 2019-12-23 NOTE — Progress Notes (Signed)
HPI: [name] is a 64 y.o. male, who is here today for follow up.   [name] was last seen on 10/27/19. [name] has seen his neurologist since his last visit.  HTN: [name] cannot afford a BP monitor. Taking Diltiazem 60 mg bid, added last visit. [name] has tolerated medication well.  Medication was also added because sinus tachycardia.  [name] is checking at [employer] and BP has been elevated 150's-160's/90-100's and occasionally SBP 170-184. HR's high 90's to low 100's.  [name] had an episode of palpitation. Negative for CP.SOB,orthopnea,PND,focal neurologist weakness, or unusual edema. Sometimes headache but no more frequent than usual.   Ref Range & Units 7 mo ago  Sodium 135 - 145 mmol/L 139      Potassium 3.5 - 5.0 mmol/L 3.7      Chloride 98 - 107 mmol/L 101      Anion Gap 7 - 15 mmol/L 7      CO2 22.0 - 30.0 mmol/L 31.0High      BUN 7 - 21 mg/dL 13      Creatinine 0.70 - 1.30 mg/dL 0.90      BUN/Creatinine Ratio  14      EGFR CKD-EPI Non-African American, Male >=60 mL/min/1.71m2 >90      EGFR CKD-EPI African American, Male >=60 mL/min/1.2m2 >90      Glucose 70 - 179 mg/dL 100      Calcium 8.5 - 10.2 mg/dL 9.6      Albumin 3.5 - 5.0 g/dL 4.6      Total Protein 6.5 - 8.3 g/dL 7.2      Total Bilirubin 0.0 - 1.2 mg/dL 0.6      AST 19 - 55 U/L 23      ALT <50 U/L 10      Alkaline Phosphatase 38 - 126 U/L 51       Insomnia: She is on Ambien 10 mg daily. [name] does not take medication daily. Sleeps well when [name] takes medication.  [name] needs refills. Anxiety and depression: [name] is on Fluoxetine 20 mg daily. [name] has taken medication for years. Anxiety is his main problem. Exacerbated by health problems.  Depression screen [facility] 2/9 11/37/2021 12/21/2018 09/17/2018  Decreased Interest 0 0 0  Down, Depressed, Hopeless 0 0 0  PHQ - 2 Score 0 0 0  Altered sleeping 0 - -  Tired, decreased energy 0 - -  Change in appetite 0 - -  Feeling bad or failure about yourself   0 - -  Trouble concentrating 1 - -  Moving slowly or fidgety/restless 0 - -  Suicidal thoughts 0 - -  PHQ-9 Score 1 - -  Difficult doing work/chores Somewhat difficult - -   GAD 7 : Generalized Anxiety Score 12/23/2019  Nervous, Anxious, on Edge 1  Control/stop worrying 1  Worry too much - different things 1  Trouble relaxing 1  Restless 1  Easily annoyed or irritable 1  Afraid - awful might happen 1  Total GAD 7 Score 7  Anxiety Difficulty Somewhat difficult     Review of Systems  Constitutional: Positive for fatigue. Negative for activity change, appetite change and fever.  HENT: Negative for mouth sores, nosebleeds and sore throat.   Respiratory: Negative for cough and wheezing.   Gastrointestinal: Negative for abdominal pain, nausea and vomiting.  Genitourinary: Negative for decreased urine volume and hematuria.  Neurological: Negative for syncope, facial asymmetry and weakness.  Psychiatric/Behavioral: The patient is nervous/anxious.   Rest of ROS,  see pertinent positives sand negatives in HPI  Current Outpatient Medications on File Prior to Visit  Medication Sig Dispense Refill  . calcium carbonate (OS-CAL) 600 MG TABS Take 600 mg by mouth 3 (three) times daily with meals.      . Cholecalciferol (VITAMIN D) 2000 UNITS tablet Take 2,000 Units by mouth daily.      . colesevelam (WELCHOL) 625 MG tablet Take 3 tablets (1,875 mg total) by mouth daily. 270 tablet 3  . desonide (DESONATE) 0.05 % gel Apply 1 application topically 2 (two) times daily as needed.     . entecavir (BARACLUDE) 1 MG tablet Take 1 tablet (1 mg total) by mouth daily. 30 tablet 6  . fluocinonide cream (LIDEX) 2.42 % Apply 1 application topically 2 (two) times daily. 45 g 1  . ketoconazole (NIZORAL) 2 % cream Apply 1 application topically 2 (two) times daily as needed for irritation.     . Lancets (ONETOUCH ULTRASOFT) lancets USE AS INSTRUCTED TO CHECK BLOOD SUGARS THREE TIMES A DAY 200 each 3  . metFORMIN  (GLUCOPHAGE-XR) 500 MG 24 hr tablet TAKE TWO TABLETS BY MOUTH TWICE A DAY 360 tablet 0  . omeprazole (PRILOSEC) 20 MG capsule TAKE ONE CAPSULE BY MOUTH DAILY 90 capsule 3  . ONETOUCH ULTRA test strip USE TO TEST FOR BLOOD SUGAR THREE TIMES A DAY 100 strip 11  . pimecrolimus (ELIDEL) 1 % cream Apply 1 application topically 2 (two) times daily as needed.     . predniSONE (DELTASONE) 5 MG tablet Take 5 mg by mouth as directed.    . pyridostigmine (MESTINON) 60 MG tablet Take 60 mg by mouth 3 (three) times daily.     . repaglinide (PRANDIN) 0.5 MG tablet TAKE ONE TABLET BY MOUTH THREE TIMES A DAY BEFORE MEALS. ** TAKE ONLY IF BLOOD SUGAR IS OVER 200 ** 270 tablet 10  . sitaGLIPtin (JANUVIA) 100 MG tablet Take 1 tablet (100 mg total) by mouth daily. 90 tablet 3  . triamcinolone cream (KENALOG) 0.1 % Apply 1 application topically 2 (two) times daily as needed.      No current facility-administered medications on file prior to visit.   Past Medical History:  Diagnosis Date  . Anxiety   . Colon polyp   . Depressive disorder, not elsewhere classified   . External hemorrhoid   . Insomnia, unspecified   . Internal hemorrhoids   . Myasthenia gravis without exacerbation ([place])   . Other and unspecified hyperlipidemia   . 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 Type II or unspecified type diabetes mellitus without mention of complication, not stated as uncontrolled   . 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 Azithromycin Other (See Comments)    REACTION: exac of his mg  . Beta Adrenergic Blockers Other (See Comments)    REACTION: exac of mg    Social History   Socioeconomic History  . Marital status: Married    Spouse name: Not on file  . Number of children: Not on file  . Years of education: Not on file  . Highest education level: Not on file  Occupational History  . Not on file    Tobacco Use  . Smoking status: Never Smoker  . Smokeless tobacco: Never Used  Substance and Sexual Activity  . Alcohol use: No  . Drug use: No  . Sexual activity: Not on file  Other Topics Concern  . Not on file  Social History Narrative  . Not on file   Social Determinants of Health   Financial Resource Strain:   . Difficulty of Paying Living Expenses: Not on file  Food Insecurity:   . Worried About Charity fundraiser in the Last Year: Not on file  . Ran Out of Food in the Last Year: Not on file  Transportation Needs:   . Lack of Transportation (Medical): Not on file  . Lack of Transportation (Non-Medical): Not on file  Physical Activity:   . Days of Exercise per Week: Not on file  . Minutes of Exercise per Session: Not on file  Stress:   . Feeling of Stress : Not on file  Social Connections:   . Frequency of Communication with Friends and Family: Not on file  . Frequency of Social Gatherings with Friends and Family: Not on file  . Attends Religious Services: Not on file  . Active Member of Clubs or Organizations: Not on file  . Attends Archivist Meetings: Not on file  . Marital Status: Not on file    Vitals:   12/23/19 0913  BP: 132/90  Pulse: 95  Resp: 16  Temp: 98.6 F (37 C)  SpO2: 98%   Body mass index is 20.57 kg/m.  Physical Exam Vitals and nursing note reviewed.  Constitutional:      General: [name] is not in acute distress.    Appearance: [name] is well-developed.  HENT:     Head: Normocephalic and atraumatic.     Mouth/Throat:     Mouth: Mucous membranes are moist.     Pharynx: Oropharynx is clear.  Eyes:     Conjunctiva/sclera: Conjunctivae normal.     Pupils: Pupils are equal, round, and reactive to light.  Cardiovascular:     Rate and Rhythm: Normal rate and regular rhythm. Occasional extrasystoles are present.    Pulses:          Dorsalis pedis pulses are 2+ on the right side and 2+ on the left side.     Heart sounds: No murmur  heard.      Comments: LE's trace pitting edema. Pulmonary:     Effort: Pulmonary effort is normal. No respiratory distress.     Breath sounds: Normal breath sounds.  Abdominal:     Palpations: Abdomen is soft. There is no hepatomegaly or mass.     Tenderness: There is no abdominal tenderness.  Lymphadenopathy:     Cervical: No cervical adenopathy.  Skin:    General: Skin is warm.     Findings: No erythema or rash.  Neurological:     Mental Status: [name] is alert and oriented to person, place, and time.     Cranial Nerves: No cranial nerve deficit.  Psychiatric:        Mood and Affect: Mood is anxious.     Comments: Well groomed, good eye contact.   ASSESSMENT AND PLAN:  [name] was seen today for follow-up.  Orders Placed This Encounter  Procedures  . EKG 12-Lead   Sinus tachycardia HR normal high but Improved. Today mildly irregular HR. EKG: SR,normal axis and intervals, ? IVCD,and PAC's. Compared with EKG 09/18/17 and 06/26/17 no significant changes. Diltiazem dose increased from 60 mg bid to 90 mg bid. Continue monitoring BP regularly.  Major depression in partial remission (HCC) Problem is stable. Continue Fluoxetine 20 mg daily.  INSOMNIA Ambien still helping,so continue 5-10 mg daily  as bedtime. Good sleep hygiene.  Anxiety disorder, unspecified Stable. Continue Fluoxetine 20 mg daily.  Hypertension, essential, benign Re-checked 150/85. Recommend checking BP at home, Rx for BP monitor given. Diltiazem dose increased from 60 mg bid to 90 mg bid. Continue monitoring BP, ideally at home. Rx for BP monitor given. Low salt diet. Instructed about warning signs.   Spent 42 minutes.  During this time history was obtained and documented, examination was performed, prior labs, and assessment/plan discussed. Rx for BP monitor given. Prefers not to have blood work today.  Return in about 3 months (around 03/24/2020) for HTN.   Shresta Risden G. [place],  MD  [facility]. [place] office.  A few things to remember from today's visit:   Hypertension, essential, benign - Plan: EKG 12-Lead, diltiazem (CARDIZEM SR) 90 MG 12 hr capsule  Recurrent major depressive disorder, in partial remission (HCC) - Plan: FLUoxetine (PROZAC) 20 MG capsule  Anxiety disorder, unspecified type - Plan: FLUoxetine (PROZAC) 20 MG capsule  Insomnia, unspecified type - Plan: zolpidem (AMBIEN) 10 MG tablet  Sinus tachycardia - Plan: diltiazem (CARDIZEM SR) 90 MG 12 hr capsule  Diltiazem increased from 60 mg to 90 mg tab to continue 2 times daily. Please call insurance to inquire about blood pressure monitor coverage. Continue monitoring heart rate and blood pressure.  Meds sent to your pharmacy.  If you need refills please call your pharmacy. Do not use My Chart to request refills or for acute issues that need immediate attention.    Please be sure medication list is accurate. If a new problem present, please set up appointment sooner than planned today.

## 2019-12-23 NOTE — Patient Instructions (Signed)
A few things to remember from today's visit:   Hypertension, essential, benign - Plan: EKG 12-Lead, diltiazem (CARDIZEM SR) 90 MG 12 hr capsule  Recurrent major depressive disorder, in partial remission (HCC) - Plan: FLUoxetine (PROZAC) 20 MG capsule  Anxiety disorder, unspecified type - Plan: FLUoxetine (PROZAC) 20 MG capsule  Insomnia, unspecified type - Plan: zolpidem (AMBIEN) 10 MG tablet  Sinus tachycardia - Plan: diltiazem (CARDIZEM SR) 90 MG 12 hr capsule  Diltiazem increased from 60 mg to 90 mg tab to continue 2 times daily. Please call insurance to inquire about blood pressure monitor coverage. Continue monitoring heart rate and blood pressure.  Meds sent to your pharmacy.  If you need refills please call your pharmacy. Do not use My Chart to request refills or for acute issues that need immediate attention.    Please be sure medication list is accurate. If a new problem present, please set up appointment sooner than planned today.

## 2019-12-23 NOTE — Assessment & Plan Note (Addendum)
Re-checked 150/85. Recommend checking BP at home, Rx for BP monitor given. Diltiazem dose increased from 60 mg bid to 90 mg bid. Continue monitoring BP, ideally at home. Rx for BP monitor given. Low salt diet. Instructed about warning signs.

## 2019-12-23 NOTE — Assessment & Plan Note (Signed)
Stable. Continue Fluoxetine 20 mg daily.

## 2019-12-31 ENCOUNTER — Telehealth: Payer: Self-pay | Admitting: Family Medicine

## 2019-12-31 NOTE — Telephone Encounter (Signed)
Left message for patient to call back and schedule Medicare Annual Wellness Visit (AWV) either virtually or in office.  Last AWV 09/17/18; please schedule at anytime with [facility] Nurse Health Advisor 2.  This should be a 45 minute visit.

## 2020-01-06 DIAGNOSIS — B181 Chronic viral hepatitis B without delta-agent: Secondary | ICD-10-CM | POA: Diagnosis not present

## 2020-01-23 ENCOUNTER — Other Ambulatory Visit: Payer: Self-pay | Admitting: Family Medicine

## 2020-01-23 DIAGNOSIS — I10 Essential (primary) hypertension: Secondary | ICD-10-CM

## 2020-02-24 ENCOUNTER — Other Ambulatory Visit: Payer: Self-pay | Admitting: Family Medicine

## 2020-02-24 DIAGNOSIS — I10 Essential (primary) hypertension: Secondary | ICD-10-CM

## 2020-02-24 DIAGNOSIS — R000 Tachycardia, unspecified: Secondary | ICD-10-CM

## 2020-03-23 ENCOUNTER — Telehealth: Payer: Self-pay | Admitting: Family Medicine

## 2020-03-23 NOTE — Telephone Encounter (Signed)
Left message for patient to call back and schedule Medicare Annual Wellness Visit (AWV) either virtually or in office. No detailed message left  Last AWV 09/17/2018 please schedule at anytime with [facility] Nurse Health Advisor 1 or 2   This should be a 45 minute visit. 

## 2020-04-06 ENCOUNTER — Other Ambulatory Visit: Payer: Self-pay | Admitting: Endocrinology

## 2020-04-14 ENCOUNTER — Ambulatory Visit (INDEPENDENT_AMBULATORY_CARE_PROVIDER_SITE_OTHER): Payer: Medicare Other | Admitting: Endocrinology

## 2020-04-14 ENCOUNTER — Other Ambulatory Visit: Payer: Self-pay

## 2020-04-14 VITALS — BP 136/90 | HR 95 | Ht 65.0 in | Wt 122.0 lb

## 2020-04-14 DIAGNOSIS — E119 Type 2 diabetes mellitus without complications: Secondary | ICD-10-CM

## 2020-04-14 LAB — POCT GLYCOSYLATED HEMOGLOBIN (HGB A1C): Hemoglobin A1C: 5.8 % — AB (ref 4.0–5.6)

## 2020-04-14 MED ORDER — REPAGLINIDE 0.5 MG PO TABS: ORAL_TABLET | ORAL | 10 refills | Status: DC

## 2020-04-14 MED ORDER — COLESEVELAM HCL 625 MG PO TABS
1875.0000 mg | ORAL_TABLET | Freq: Every day | ORAL | 3 refills | Status: DC
Start: 2020-04-14 — End: 2021-04-21

## 2020-04-14 MED ORDER — METFORMIN HCL ER 500 MG PO TB24: 1000.0000 mg | ORAL_TABLET | Freq: Two times a day (BID) | ORAL | 0 refills | Status: DC

## 2020-04-14 NOTE — Progress Notes (Signed)
Subjective:    Patient ID: [name], male    DOB: [birth year], 65 y.o.   MRN: [id number]  HPI Pt returns for f/u of diabetes mellitus: DM type: 2, but he is presumed to be evolving type 1, given lean body habitus, MG, psoriasis, and neg FHx of DM.   Dx'ed: 6144 Complications: none Therapy: 4 oral meds DKA: never Severe hypoglycemia: never.  Pancreatitis: never.  Other: he has never been on insulin.  Interval history: He says cbg's vary from 89-200.  pt states he feels well in general.  Prednisone is still 10 mg QD.   Pt has had fluctuating levels of Ca++ and PTH (sometimes low, sometimes normal; since 2015; vit-D has been persistently normal on rx; these were normal: Vit-A, PTH-RP, Mg++, and 24-HR urine Ca++).  He takes vit-D, 2000 units/day.  pt states he feels well in general.  Past Medical History:  Diagnosis Date  . Anxiety   . Colon polyp   . Depressive disorder, not elsewhere classified   . External hemorrhoid   . Insomnia, unspecified   . Internal hemorrhoids   . Myasthenia gravis without exacerbation ([place])   . Other and unspecified hyperlipidemia   . 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 Type II or unspecified type diabetes mellitus without mention of complication, not stated as uncontrolled   . Viral hepatitis B without mention of hepatic coma, chronic, without mention of hepatitis delta     Past Surgical History:  Procedure Laterality Date  . THYMECTOMY      Social History   Socioeconomic History  . Marital status: Married    Spouse name: Not on file  . Number of children: Not on file  . Years of education: Not on file  . Highest education level: Not on file  Occupational History  . Not on file  Tobacco Use  . Smoking status: Never Smoker  . Smokeless tobacco: Never Used  Substance and Sexual Activity  . Alcohol use: No  . Drug use: No  . Sexual activity: Not on file  Other Topics Concern  . Not on file  Social History Narrative   . Not on file   Social Determinants of Health   Financial Resource Strain: Not on file  Food Insecurity: Not on file  Transportation Needs: Not on file  Physical Activity: Not on file  Stress: Not on file  Social Connections: Not on file  Intimate Partner Violence: Not on file    Current Outpatient Medications on File Prior to Visit  Medication Sig Dispense Refill  . calcium carbonate (OS-CAL) 600 MG TABS Take 600 mg by mouth 3 (three) times daily with meals.    . Cholecalciferol (VITAMIN D) 2000 UNITS tablet Take 2,000 Units by mouth daily.    Marland Kitchen desonide (DESONATE) 0.05 % gel Apply 1 application topically 2 (two) times daily as needed.     . diltiazem (CARDIZEM SR) 90 MG 12 hr capsule TAKE ONE CAPSULE BY MOUTH TWICE A DAY 60 capsule 1  . entecavir (BARACLUDE) 1 MG tablet Take 1 tablet (1 mg total) by mouth daily. 30 tablet 6  . fluocinonide cream (LIDEX) 3.15 % Apply 1 application topically 2 (two) times daily. 45 g 1  . FLUoxetine (PROZAC) 20 MG capsule Take 1 capsule (20 mg total) by mouth daily. 90 capsule 3  . ketoconazole (NIZORAL) 2 % cream Apply 1 application topically 2 (two) times daily as needed for irritation.     . Lancets (  ONETOUCH ULTRASOFT) lancets USE AS INSTRUCTED TO CHECK BLOOD SUGARS THREE TIMES A DAY 200 each 3  . omeprazole (PRILOSEC) 20 MG capsule TAKE ONE CAPSULE BY MOUTH DAILY 90 capsule 3  . ONETOUCH ULTRA test strip USE TO TEST FOR BLOOD SUGAR THREE TIMES A DAY 100 strip 11  . pimecrolimus (ELIDEL) 1 % cream Apply 1 application topically 2 (two) times daily as needed.     . predniSONE (DELTASONE) 5 MG tablet Take 5 mg by mouth as directed.    . pyridostigmine (MESTINON) 60 MG tablet Take 60 mg by mouth 3 (three) times daily.    . sitaGLIPtin (JANUVIA) 100 MG tablet Take 1 tablet (100 mg total) by mouth daily. 90 tablet 3  . triamcinolone cream (KENALOG) 0.1 % Apply 1 application topically 2 (two) times daily as needed.     . zolpidem (AMBIEN) 10 MG tablet  Take 0.5-1 tablets (5-10 mg total) by mouth at bedtime as needed. 30 tablet 3   No current facility-administered medications on file prior to visit.    Allergies  Allergen Reactions  . Azithromycin Other (See Comments)    REACTION: exac of his mg  . Beta Adrenergic Blockers Other (See Comments)    REACTION: exac of mg    No family history on file.  BP 136/90 (BP Location: Right Arm, Patient Position: Sitting, Cuff Size: Normal)   Pulse 95   Ht 5\' 5"  (1.651 m)   Wt 122 lb (55.3 kg)   SpO2 98%   BMI 20.30 kg/m    Review of Systems     Objective:   Physical Exam VITAL SIGNS:  See vs page GENERAL: no distress Pulses: dorsalis pedis intact bilat.   MSK: no deformity of the feet CV: no leg edema Skin:  no ulcer on the feet.  normal color and temp on the feet. Neuro: sensation is intact to touch on the feet.     Lab Results  Component Value Date   HGBA1C 5.8 (A) 04/14/2020   Lab Results  Component Value Date   TSH 0.878 09/17/2017   Lab Results  Component Value Date   CREATININE 1.08 09/17/2018   BUN 11 09/17/2018   NA 137 09/17/2018   K 3.6 09/17/2018   CL 97 09/17/2018   CO2 27 09/17/2018      Assessment & Plan:  Type 2 DM: well-controlled Hypocalcemia: I advised recheck, but he declines labs today.  We discussed need to check.    Patient Instructions  Please continue the same 4 diabetes medications.    check your blood sugar once a day.  vary the time of day when you check, between before the 3 meals, and at bedtime.  also check if you have symptoms of your blood sugar being too high or too low.  please keep a record of the readings and bring it to your next appointment here (or you can bring the meter itself).  You can write it on any piece of paper.  please call us sooner if your blood sugar goes below 70, or if you have a lot of readings over 200.   Please get blood tests done when you see Dr Martinique.  You are due to see her.  Please make an  appointment Please come back for a follow-up appointment in 6 months.

## 2020-04-14 NOTE — Patient Instructions (Addendum)
Please continue the same 4 diabetes medications.    check your blood sugar once a day.  vary the time of day when you check, between before the 3 meals, and at bedtime.  also check if you have symptoms of your blood sugar being too high or too low.  please keep a record of the readings and bring it to your next appointment here (or you can bring the meter itself).  You can write it on any piece of paper.  please call us sooner if your blood sugar goes below 70, or if you have a lot of readings over 200.   Please get blood tests done when you see Dr Martinique.  You are due to see her.  Please make an appointment Please come back for a follow-up appointment in 6 months.

## 2020-04-15 DIAGNOSIS — E119 Type 2 diabetes mellitus without complications: Secondary | ICD-10-CM | POA: Diagnosis not present

## 2020-04-15 LAB — HM DIABETES EYE EXAM

## 2020-04-25 ENCOUNTER — Other Ambulatory Visit: Payer: Self-pay | Admitting: Family Medicine

## 2020-04-25 DIAGNOSIS — I10 Essential (primary) hypertension: Secondary | ICD-10-CM

## 2020-04-25 DIAGNOSIS — R000 Tachycardia, unspecified: Secondary | ICD-10-CM

## 2020-06-07 ENCOUNTER — Other Ambulatory Visit: Payer: Self-pay | Admitting: Endocrinology

## 2020-06-11 ENCOUNTER — Other Ambulatory Visit: Payer: Self-pay | Admitting: Family Medicine

## 2020-06-23 ENCOUNTER — Telehealth: Payer: Self-pay | Admitting: Family Medicine

## 2020-06-23 NOTE — Telephone Encounter (Signed)
Left message for patient to call back and schedule Medicare Annual Wellness Visit (AWV) either virtually or in office.   Last AWV 09/17/2018  please schedule at anytime with [facility] Nurse Health Advisor 1 or 2   This should be a 45 minute visit.

## 2020-07-07 ENCOUNTER — Other Ambulatory Visit: Payer: Self-pay | Admitting: Endocrinology

## 2020-07-07 DIAGNOSIS — E119 Type 2 diabetes mellitus without complications: Secondary | ICD-10-CM

## 2020-07-27 ENCOUNTER — Other Ambulatory Visit: Payer: Self-pay | Admitting: Family Medicine

## 2020-07-27 DIAGNOSIS — L409 Psoriasis, unspecified: Secondary | ICD-10-CM

## 2020-07-28 DIAGNOSIS — Z79899 Other long term (current) drug therapy: Secondary | ICD-10-CM | POA: Diagnosis not present

## 2020-07-28 DIAGNOSIS — D4989 Neoplasm of unspecified behavior of other specified sites: Secondary | ICD-10-CM | POA: Diagnosis not present

## 2020-07-28 DIAGNOSIS — E119 Type 2 diabetes mellitus without complications: Secondary | ICD-10-CM | POA: Diagnosis not present

## 2020-07-28 DIAGNOSIS — G7000 Myasthenia gravis without (acute) exacerbation: Secondary | ICD-10-CM | POA: Diagnosis not present

## 2020-08-05 ENCOUNTER — Telehealth: Payer: Self-pay | Admitting: Family Medicine

## 2020-08-05 NOTE — Telephone Encounter (Signed)
Left message for patient to call back and schedule Medicare Annual Wellness Visit (AWV) either virtually or in office.   Last AWV 09/17/2018  please schedule at anytime with [facility] Nurse Health Advisor 1 or 2   This should be a 45 minute visit.

## 2020-08-11 NOTE — Telephone Encounter (Signed)
Patient called back and do not want to schedule appointment at this time.

## 2020-08-23 ENCOUNTER — Other Ambulatory Visit: Payer: Self-pay

## 2020-08-23 ENCOUNTER — Encounter: Payer: Self-pay | Admitting: Family Medicine

## 2020-08-23 ENCOUNTER — Ambulatory Visit (INDEPENDENT_AMBULATORY_CARE_PROVIDER_SITE_OTHER): Payer: Medicare Other | Admitting: Family Medicine

## 2020-08-23 VITALS — BP 144/90 | HR 90 | Temp 98.3°F | Resp 16 | Ht 65.0 in | Wt 121.4 lb

## 2020-08-23 DIAGNOSIS — G4700 Insomnia, unspecified: Secondary | ICD-10-CM | POA: Diagnosis not present

## 2020-08-23 DIAGNOSIS — R000 Tachycardia, unspecified: Secondary | ICD-10-CM | POA: Diagnosis not present

## 2020-08-23 DIAGNOSIS — I10 Essential (primary) hypertension: Secondary | ICD-10-CM | POA: Diagnosis not present

## 2020-08-23 DIAGNOSIS — L282 Other prurigo: Secondary | ICD-10-CM | POA: Diagnosis not present

## 2020-08-23 DIAGNOSIS — E119 Type 2 diabetes mellitus without complications: Secondary | ICD-10-CM

## 2020-08-23 DIAGNOSIS — F419 Anxiety disorder, unspecified: Secondary | ICD-10-CM

## 2020-08-23 DIAGNOSIS — Z8679 Personal history of other diseases of the circulatory system: Secondary | ICD-10-CM

## 2020-08-23 LAB — MICROALBUMIN / CREATININE URINE RATIO
Creatinine,U: 20.2 mg/dL
Microalb Creat Ratio: 5.4 mg/g (ref 0.0–30.0)
Microalb, Ur: 1.1 mg/dL (ref 0.0–1.9)

## 2020-08-23 LAB — BASIC METABOLIC PANEL
BUN: 9 mg/dL (ref 6–23)
CO2: 31 mEq/L (ref 19–32)
Calcium: 9.8 mg/dL (ref 8.4–10.5)
Chloride: 98 mEq/L (ref 96–112)
Creatinine, Ser: 1.02 mg/dL (ref 0.40–1.50)
GFR: 77.9 mL/min (ref >60.00)
Glucose, Bld: 101 mg/dL — ABNORMAL HIGH (ref 70–99)
Potassium: 3.7 mEq/L (ref 3.5–5.1)
Sodium: 138 mEq/L (ref 135–145)

## 2020-08-23 LAB — VITAMIN D 25 HYDROXY (VIT D DEFICIENCY, FRACTURES): VITD: 90.96 ng/mL (ref 30.00–100.00)

## 2020-08-23 MED ORDER — FLUOCINONIDE EMULSIFIED BASE 0.05 % EX CREA: 1.0000 "application " | TOPICAL_CREAM | Freq: Every day | CUTANEOUS | 2 refills | Status: DC | PRN

## 2020-08-23 MED ORDER — DILTIAZEM HCL ER 120 MG PO CP12: 120.0000 mg | ORAL_CAPSULE | Freq: Two times a day (BID) | ORAL | 2 refills | Status: DC

## 2020-08-23 NOTE — Assessment & Plan Note (Signed)
Stable. Continue Ambien 5-10 mg at bedtime as needed. Good sleep hygiene. Will follow in 9 months.

## 2020-08-23 NOTE — Assessment & Plan Note (Addendum)
Still symptomatic, financial stress.  He does snot want to change Fluoxetine dose, so continue 20 mg daily. CBT may also help. Will continue following q 9 months, before if needed.

## 2020-08-23 NOTE — Progress Notes (Signed)
HPI: [name] is a 65 y.o. male, who is here today for 6 months follow up.   He was last seen on 12/23/19. Since his last visit he has seen his neurologist ,hepatologist,and endocrinologist.  No new problems. States that Dr Loanne Drilling ordered some labs during his last visit but he declined, he was concerned about the cost.  Hypertension and sinus tachycardia: Medications:Diltiazem 120 mg bid. BP readings at home:130-140's/80-90's. Side effects:None  Negative for unusual or severe headache, visual changes, exertional chest pain, dyspnea,  focal weakness, or edema. 05/07/19  Sodium 135 - 145 mmol/L 139   Potassium 3.5 - 5.0 mmol/L 3.7   Chloride 98 - 107 mmol/L 101   Anion Gap 7 - 15 mmol/L 7   CO2 22.0 - 30.0 mmol/L 31.0 High    BUN 7 - 21 mg/dL 13   Creatinine 0.70 - 1.30 mg/dL 0.90   BUN/Creatinine Ratio  14   EGFR CKD-EPI Non-African American, Male >=60 mL/min/1.71m >90   EGFR CKD-EPI African American, Male >=60 mL/min/1.737m>90    Pruritic rash: Chronic.  He is requesting refills on Hydrocortisone cream and Lidex cream. Problem is intermittent, stable. Affects hands (dorsum and fingers),waist,and LE's sometimes. Not sure about exacerbating or alleviating factors.  Insomnia: He takes Ambien 10 mg 1/2-1 tab daily as needed. Problem exacerbated by anxiety. He does not take mediation daily, when he does he sleeps well, 6-8 hours.When he does not take his medication, he sleeps 2-3 hours. Tolerating medication well.  Anxiety: He is on Fluoxetine 20 mg daily. Financial stress exacerbate problem. Negative for depressed mood. Denies suicidal thoughts.  Review of Systems  Constitutional:  Positive for fatigue. Negative for activity change, appetite change and fever.  HENT:  Negative for mouth sores, nosebleeds and sore throat.   Eyes:  Negative for redness and visual disturbance.  Respiratory:  Negative for cough and wheezing.   Gastrointestinal:  Negative for  abdominal pain, nausea and vomiting.  Genitourinary:  Negative for decreased urine volume, dysuria and hematuria.  Musculoskeletal:  Negative for gait problem and myalgias.  Allergic/Immunologic: Positive for environmental allergies.  Neurological:  Negative for facial asymmetry and weakness.  Psychiatric/Behavioral:  Positive for sleep disturbance. Negative for confusion. The patient is nervous/anxious.   Rest of ROS, see pertinent positives sand negatives in HPI  Current Outpatient Medications on File Prior to Visit  Medication Sig Dispense Refill   calcium carbonate (OS-CAL) 600 MG TABS Take 600 mg by mouth 3 (three) times daily with meals.     Cholecalciferol (VITAMIN D) 2000 UNITS tablet Take 2,000 Units by mouth daily.     colesevelam (WELCHOL) 625 MG tablet Take 3 tablets (1,875 mg total) by mouth daily. 270 tablet 3   desonide (DESONATE) 0.05 % gel Apply 1 application topically 2 (two) times daily as needed.      entecavir (BARACLUDE) 1 MG tablet Take 1 tablet (1 mg total) by mouth daily. 30 tablet 6   fluocinonide cream (LIDEX) 0.5.39 Apply 1 application topically 2 (two) times daily. 45 g 1   fluocinonide ointment (LIDEX) 0.7.67 APPLY 1 APPLICATION TOPICALLY TWO TIMES A DAY AS NEEDED 30 g 1   FLUoxetine (PROZAC) 20 MG capsule Take 1 capsule (20 mg total) by mouth daily. 90 capsule 3   JANUVIA 100 MG tablet TAKE ONE TABLET BY MOUTH DAILY 90 tablet 3   ketoconazole (NIZORAL) 2 % cream Apply 1 application topically 2 (two) times daily as needed for irritation.  Lancets (ONETOUCH ULTRASOFT) lancets USE AS INSTRUCTED TO CHECK BLOOD SUGARS THREE TIMES A DAY 200 each 3   metFORMIN (GLUCOPHAGE-XR) 500 MG 24 hr tablet TAKE TWO TABLETS BY MOUTH TWICE A DAY 360 tablet 0   omeprazole (PRILOSEC) 20 MG capsule TAKE ONE CAPSULE BY MOUTH DAILY 90 capsule 1   ONETOUCH ULTRA test strip USE TO TEST FOR BLOOD SUGAR THREE TIMES A DAY 100 strip 11   pimecrolimus (ELIDEL) 1 % cream Apply 1 application  topically 2 (two) times daily as needed.      predniSONE (DELTASONE) 5 MG tablet Take 7.5 mg by mouth as directed.     pyridostigmine (MESTINON) 60 MG tablet Take 60 mg by mouth 3 (three) times daily.     repaglinide (PRANDIN) 0.5 MG tablet TAKE ONE TABLET BY MOUTH THREE TIMES A DAY BEFORE MEALS.  TAKE ONLY IF BLOOD SUGAR IS OVER 200 270 tablet 10   zolpidem (AMBIEN) 10 MG tablet Take 0.5-1 tablets (5-10 mg total) by mouth at bedtime as needed. 30 tablet 3   No current facility-administered medications on file prior to visit.   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)    Type II or unspecified type diabetes mellitus without mention of complication, not stated as uncontrolled    Viral hepatitis B without mention of hepatic coma, chronic, without mention of hepatitis delta    Allergies  Allergen Reactions   Azithromycin Other (See Comments)    REACTION: exac of his mg   Beta Adrenergic Blockers Other (See Comments)    REACTION: exac of mg    Social History   Socioeconomic History   Marital status: Married    Spouse name: Not on file   Number of children: Not on file   Years of education: Not on file   Highest education level: Not on file  Occupational History   Not on file  Tobacco Use   Smoking status: Never   Smokeless tobacco: Never  Substance and Sexual Activity   Alcohol use: No   Drug use: No   Sexual activity: Not on file  Other Topics Concern   Not on file  Social History Narrative   Not on file   Social Determinants of Health   Financial Resource Strain: Not on file  Food Insecurity: Not on file  Transportation Needs: Not on file  Physical Activity: Not on file  Stress:  Not on file  Social Connections: Not on file   Vitals:   08/23/20 0915  BP: (!) 144/90  Pulse: 90  Resp: 16  Temp: 98.3 F (36.8 C)  SpO2: 99%   Body mass index is 20.2 kg/m.  Physical Exam Nursing note reviewed.  Constitutional:      General: He is not in acute distress.    Appearance: He is well-developed.  HENT:     Head: Normocephalic and atraumatic.  Eyes:     Conjunctiva/sclera: Conjunctivae normal.  Cardiovascular:     Rate and Rhythm: Normal rate and regular rhythm.     Pulses:          Dorsalis pedis pulses are 2+ on the right side and 2+ on the left side.  Heart sounds: No murmur heard. Pulmonary:     Effort: Pulmonary effort is normal. No respiratory distress.     Breath sounds: Normal breath sounds.  Abdominal:     Palpations: Abdomen is soft. There is no hepatomegaly or mass.     Tenderness: There is no abdominal tenderness.  Lymphadenopathy:     Cervical: No cervical adenopathy.  Skin:    General: Skin is warm.     Findings: Rash present. No erythema.       Neurological:     Mental Status: He is alert and oriented to person, place, and time.     Cranial Nerves: No cranial nerve deficit.     Gait: Gait normal.  Psychiatric:     Comments: Well groomed, good eye contact.   ASSESSMENT AND PLAN:  [name] was seen today for 6 months follow-up.  Orders Placed This Encounter  Procedures   Basic metabolic panel   Microalbumin / creatinine urine ratio   Lab Results  Component Value Date   CREATININE 1.02 08/23/2020   BUN 9 08/23/2020   NA 138 08/23/2020   K 3.7 08/23/2020   CL 98 08/23/2020   CO2 31 08/23/2020   Lab Results  Component Value Date   MICROALBUR 1.1 08/23/2020        INSOMNIA Stable. Continue Ambien 5-10 mg at bedtime as needed. Good sleep hygiene. Will follow in 9 months.  Anxiety disorder, unspecified Still symptomatic, financial stress.  He does snot want to change Fluoxetine dose, so continue 20 mg  daily. CBT may also help. Will continue following q 9 months, before if needed.  Hypertension, essential, benign BP is not well controlled. Possible complications of elevated BP discussed. Pharmacologic options discussed,he does not want to add new medications. Changes today:Diltiazem increased from 90 mg bid to 120 mg bid Low salt diet. Monitor BP at home. Instructed about warning signs. Follow-up in 9 months as requested. He is following with other providers.   Sinus tachycardia Stable. Continue Diltiazem, which dose was increased because elevated BP. Instructed to monitor HR regularly.  Pruritic rash It seems eczema. We discussed some side effects of topical steroid, recommend to continue with either hydrocortisone or Lidex,  He prefers the latter one. Small amount at the time on affected area. Avoid face or areas with thin skin. Daily moisturizer.  Type 2 diabetes mellitus without complication, without long-term current use of insulin (HCC) On Chronic prednisone. Continue following with endocrinologist.  I spent a total of 40 minutes in both face to face and non face to face activities for this visit on the date of this encounter. During this time history was obtained and documented, examination was performed, prior labs reviewed, and assessment/plan discussed.  Return in about 9 months (around 05/24/2021).   Yavonne Kiss G. Martinique, MD  [facility]. [place] office.

## 2020-08-23 NOTE — Patient Instructions (Addendum)
A few things to remember from today's visit:   Hypertension, essential, benign - Plan: Basic metabolic panel, diltiazem (CARDIZEM SR) 120 MG 12 hr capsule  Insomnia, unspecified type  Anxiety disorder, unspecified type  Pruritic rash - Plan: fluocinonide-emollient (LIDEX-E) 0.05 % cream  Type 2 diabetes mellitus without complication, without long-term current use of insulin (HCC) - Plan: Microalbumin / creatinine urine ratio  Today Diltiazem dose increased. Continue monitoring blood pressure and pulse. We will continue following every 9 months.  If you need refills please call your pharmacy. Do not use My Chart to request refills or for acute issues that need immediate attention.    Please be sure medication list is accurate. If a new problem present, please set up appointment sooner than planned today.

## 2020-08-23 NOTE — Assessment & Plan Note (Addendum)
It seems eczema. We discussed some side effects of topical steroid, recommend to continue with either hydrocortisone or Lidex,  He prefers the latter one. Small amount at the time on affected area. Avoid face or areas with thin skin. Daily moisturizer.

## 2020-08-23 NOTE — Assessment & Plan Note (Signed)
Stable. Continue Diltiazem, which dose was increased because elevated BP. Instructed to monitor HR regularly.

## 2020-08-23 NOTE — Assessment & Plan Note (Addendum)
BP is not well controlled. Possible complications of elevated BP discussed. Pharmacologic options discussed,he does not want to add new medications. Changes today:Diltiazem increased from 90 mg bid to 120 mg bid Low salt diet. Monitor BP at home. Instructed about warning signs. Follow-up in 9 months as requested. He is following with other providers.

## 2020-08-24 LAB — PTH, INTACT AND CALCIUM
Calcium: 10.1 mg/dL (ref 8.6–10.3)
PTH: 14 pg/mL — ABNORMAL LOW (ref 16–77)

## 2020-08-25 ENCOUNTER — Other Ambulatory Visit: Payer: Self-pay | Admitting: Family Medicine

## 2020-09-01 ENCOUNTER — Telehealth: Payer: Self-pay | Admitting: Family Medicine

## 2020-09-01 NOTE — Telephone Encounter (Signed)
Left message for patient to call back and schedule Medicare Annual Wellness Visit (AWV) either virtually or in office.   Last AWV 09/17/2018  ; please schedule at anytime with [facility] Nurse Health Advisor 1 or 2   This should be a 45 minute visit.

## 2020-09-01 NOTE — Telephone Encounter (Signed)
Pt call and don't want a appt at this time.

## 2020-09-07 NOTE — Telephone Encounter (Signed)
Documented on spreadsheet 

## 2020-09-20 ENCOUNTER — Other Ambulatory Visit: Payer: Self-pay | Admitting: Endocrinology

## 2020-10-18 ENCOUNTER — Telehealth: Payer: Self-pay | Admitting: *Deleted

## 2020-10-18 NOTE — Chronic Care Management (AMB) (Signed)
  Chronic Care Management   Outreach Note  10/18/2020 Name: [name] MRN: [id number] DOB: 10-Apr-1955  [name] is a 65 y.o. year old male who is a primary care patient of Martinique, Malka So, MD. I reached out to [employer] by phone today in response to a referral sent by [name]'s PCP, Dr. Martinique      An unsuccessful telephone outreach was attempted today. The patient was referred to the case management team for assistance with care management and care coordination.   Follow Up Plan: A HIPAA compliant phone message was left for the patient providing contact information and requesting a return call.  The care management team will reach out to the patient again over the next 7 days.  If patient returns call to provider office, please advise to call [place]* at [phone].*  [place] [facility]  Direct Dial: [phone]

## 2020-10-20 ENCOUNTER — Other Ambulatory Visit: Payer: Self-pay

## 2020-10-20 ENCOUNTER — Ambulatory Visit (INDEPENDENT_AMBULATORY_CARE_PROVIDER_SITE_OTHER): Payer: Medicare Other | Admitting: Endocrinology

## 2020-10-20 VITALS — BP 136/90 | HR 97 | Ht 65.0 in | Wt 120.6 lb

## 2020-10-20 DIAGNOSIS — E119 Type 2 diabetes mellitus without complications: Secondary | ICD-10-CM

## 2020-10-20 LAB — POCT GLYCOSYLATED HEMOGLOBIN (HGB A1C): Hemoglobin A1C: 5.8 % — AB (ref 4.0–5.6)

## 2020-10-20 NOTE — Patient Instructions (Addendum)
Please continue the same 4 diabetes medications.  check your blood sugar once a day.  vary the time of day when you check, between before the 3 meals, and at bedtime.  also check if you have symptoms of your blood sugar being too high or too low.  please keep a record of the readings and bring it to your next appointment here (or you can bring the meter itself).  You can write it on any piece of paper.  please call us sooner if your blood sugar goes below 70, or if you have a lot of readings over 200.   Please come back for a follow-up appointment in 9 months.

## 2020-10-20 NOTE — Progress Notes (Signed)
Subjective:    Patient ID: [name], male    DOB: 1955/08/11, 65 y.o.   MRN: [id number]  HPI Pt returns for f/u of diabetes mellitus: DM type: 2, but he is presumed to be evolving type 1, given lean body habitus, MG, psoriasis, and neg FHx of DM.   Dx'ed: 6712 Complications: none Therapy: 4 oral meds DKA: never Severe hypoglycemia: never.  Pancreatitis: never.  Other: he has never been on insulin.  Interval history: He says cbg's vary from 100-220.  pt states he feels well in general.  Prednisone is decreasing from 10 to 7.5 mg/d.  Pt has had fluctuating levels of Ca++ and PTH (sometimes low, sometimes normal; since 2015; vit-D has been persistently normal on rx; these were normal: Vit-A, PTH-RP, Mg++, and 24-HR urine Ca++).  He takes Vit-D, 1000 units/day.    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)    Type II or unspecified type diabetes mellitus without mention of complication, not stated as uncontrolled    Viral hepatitis B without mention of hepatic coma, chronic, without mention of hepatitis delta     Past Surgical History:  Procedure Laterality Date   THYMECTOMY      Social History   Socioeconomic History   Marital status: Married    Spouse name: Not on file   Number of children: Not on file   Years of education: Not on file   Highest education level: Not on file  Occupational History   Not on file  Tobacco Use   Smoking status: Never   Smokeless tobacco: Never  Substance and Sexual Activity   Alcohol use: No   Drug use: No   Sexual activity: Not on file  Other Topics Concern   Not on file  Social History Narrative   Not on file   Social Determinants of Health    Financial Resource Strain: Not on file  Food Insecurity: Not on file  Transportation Needs: Not on file  Physical Activity: Not on file  Stress: Not on file  Social Connections: Not on file  Intimate Partner Violence: Not on file    Current Outpatient Medications on File Prior to Visit  Medication Sig Dispense Refill   ampicillin (PRINCIPEN) 500 MG capsule Take by mouth.     calcium carbonate (OS-CAL) 600 MG TABS Take 600 mg by mouth 3 (three) times daily with meals.     Cholecalciferol (VITAMIN D) 2000 UNITS tablet Take 2,000 Units by mouth daily.     colesevelam (WELCHOL) 625 MG tablet Take 3 tablets (1,875 mg total) by mouth daily. 270 tablet 3   desonide (DESONATE) 0.05 % gel Apply 1 application topically 2 (two) times daily as needed.      diltiazem (CARDIZEM SR) 120 MG 12 hr capsule Take 1 capsule (120 mg total) by mouth 2 (two) times daily. 180 capsule 2   entecavir (BARACLUDE) 1 MG tablet Take 1 tablet (1 mg total) by mouth daily. 30 tablet 6   fluocinonide cream (LIDEX) 4.58 % Apply 1 application topically 2 (two) times daily. 45 g 1   fluocinonide ointment (LIDEX) 0.99 % APPLY 1 APPLICATION TOPICALLY TWO TIMES A DAY AS  NEEDED 30 g 1   fluocinonide-emollient (LIDEX-E) 0.05 % cream Apply 1 application topically daily as needed. 30 g 2   FLUoxetine (PROZAC) 20 MG capsule Take 1 capsule (20 mg total) by mouth daily. 90 capsule 3   JANUVIA 100 MG tablet TAKE ONE TABLET BY MOUTH DAILY 90 tablet 3   ketoconazole (NIZORAL) 2 % cream Apply 1 application topically 2 (two) times daily as needed for irritation.      Lancets (ONETOUCH ULTRASOFT) lancets USE AS INSTRUCTED TO CHECK BLOOD SUGARS THREE TIMES A DAY 200 each 3   metFORMIN (GLUCOPHAGE-XR) 500 MG 24 hr tablet TAKE TWO TABLETS BY MOUTH TWICE A DAY 360 tablet 0   omeprazole (PRILOSEC) 20 MG capsule TAKE ONE CAPSULE BY MOUTH DAILY 90 capsule 1   ONETOUCH ULTRA test strip USE TO TEST FOR BLOOD SUGAR THREE TIMES A DAY 100 strip 11    pimecrolimus (ELIDEL) 1 % cream Apply 1 application topically 2 (two) times daily as needed.      predniSONE (DELTASONE) 5 MG tablet Take 7.5 mg by mouth as directed.     pyridostigmine (MESTINON) 60 MG tablet Take 60 mg by mouth 3 (three) times daily.     repaglinide (PRANDIN) 0.5 MG tablet TAKE ONE TABLET BY MOUTH THREE TIMES A DAY BEFORE MEALS.  TAKE ONLY IF BLOOD SUGAR IS OVER 200 270 tablet 10   zolpidem (AMBIEN) 10 MG tablet Take 0.5-1 tablets (5-10 mg total) by mouth at bedtime as needed. 30 tablet 3   No current facility-administered medications on file prior to visit.    Allergies  Allergen Reactions   Azithromycin Other (See Comments)    REACTION: exac of his mg   Beta Adrenergic Blockers Other (See Comments)    REACTION: exac of mg    No family history on file.  BP 136/90 (BP Location: Right Arm, Patient Position: Sitting, Cuff Size: Normal)   Pulse 97   Ht 5\' 5"  (1.651 m)   Wt 120 lb 9.6 oz (54.7 kg)   SpO2 97%   BMI 20.07 kg/m   Review of Systems     Objective:   Physical Exam     Lab Results  Component Value Date   HGBA1C 5.8 (A) 10/20/2020       Assessment & Plan:  Type 2 DM: well-controlled.   Patient Instructions  Please continue the same 4 diabetes medications.  check your blood sugar once a day.  vary the time of day when you check, between before the 3 meals, and at bedtime.  also check if you have symptoms of your blood sugar being too high or too low.  please keep a record of the readings and bring it to your next appointment here (or you can bring the meter itself).  You can write it on any piece of paper.  please call us sooner if your blood sugar goes below 70, or if you have a lot of readings over 200.   Please come back for a follow-up appointment in 9 months.

## 2020-10-26 NOTE — Chronic Care Management (AMB) (Signed)
  Chronic Care Management   Outreach Note  10/26/2020 Name: Yuri Fana MRN: [id number] DOB: Mar 27, 1955  Topher Broxterman is a 65 y.o. year old male who is a primary care patient of [place], Malka So, MD. I reached out to [employer] by phone today in response to a referral sent by Mr. Agastya Mahaney's PCP.    A second unsuccessful telephone outreach was attempted today. The patient was referred to the case management team for assistance with care management and care coordination.   Follow Up Plan: A HIPAA compliant phone message was left for the patient providing contact information and requesting a return call. The care management team will reach out to the patient again over the next 7 days.If patient returns call to provider office, please advise to call [place] at [phone].  [facility]  Direct Dial: [phone]

## 2020-11-01 ENCOUNTER — Other Ambulatory Visit: Payer: Self-pay | Admitting: Family Medicine

## 2020-11-01 DIAGNOSIS — L700 Acne vulgaris: Secondary | ICD-10-CM | POA: Diagnosis not present

## 2020-11-01 DIAGNOSIS — G4700 Insomnia, unspecified: Secondary | ICD-10-CM

## 2020-11-01 DIAGNOSIS — L219 Seborrheic dermatitis, unspecified: Secondary | ICD-10-CM | POA: Diagnosis not present

## 2020-11-01 DIAGNOSIS — L308 Other specified dermatitis: Secondary | ICD-10-CM | POA: Diagnosis not present

## 2020-11-01 DIAGNOSIS — Z23 Encounter for immunization: Secondary | ICD-10-CM | POA: Diagnosis not present

## 2020-11-01 NOTE — Telephone Encounter (Signed)
Last filled 12/15/19

## 2020-11-02 NOTE — Telephone Encounter (Signed)
According to [place] controlled substance report he has 3 refills available at his pharmacy.Can you please verify. Thanks, BJ

## 2020-11-02 NOTE — Telephone Encounter (Signed)
They are expired

## 2020-11-02 NOTE — Chronic Care Management (AMB) (Signed)
  Chronic Care Management   Outreach Note  11/02/2020 Name: Vedanth Sirico MRN: [id number] DOB: January 17, 1956  Theran Miskell is a 65 y.o. year old male who is a primary care patient of [place], Malka So, MD. I reached out to [employer] by phone today in response to a referral sent by Mr. Brit Nolt's primary care provider.  Third unsuccessful telephone outreach was attempted today. The patient was referred to the case management team for assistance with care management and care coordination. The patient's primary care provider has been notified of our unsuccessful attempts to make or maintain contact with the patient. The care management team is pleased to engage with this patient at any time in the future should he/she be interested in assistance from the care management team.   Follow Up Plan: We have been unable to make contact with the patient. The care management team is available to follow up with the patient after provider conversation with the patient regarding recommendation for care management engagement and subsequent re-referral to the care management team.  A HIPAA compliant phone message was left for the patient providing contact information and requesting a return call.   [place]  Direct Dial: [phone]

## 2020-11-28 ENCOUNTER — Other Ambulatory Visit: Payer: Self-pay | Admitting: Endocrinology

## 2020-11-28 ENCOUNTER — Other Ambulatory Visit: Payer: Self-pay | Admitting: Family Medicine

## 2020-11-28 DIAGNOSIS — L409 Psoriasis, unspecified: Secondary | ICD-10-CM

## 2020-11-29 ENCOUNTER — Other Ambulatory Visit: Payer: Self-pay | Admitting: Family Medicine

## 2020-12-09 ENCOUNTER — Other Ambulatory Visit: Payer: Self-pay | Admitting: Endocrinology

## 2020-12-10 ENCOUNTER — Other Ambulatory Visit: Payer: Self-pay

## 2020-12-10 DIAGNOSIS — E119 Type 2 diabetes mellitus without complications: Secondary | ICD-10-CM

## 2020-12-10 MED ORDER — ONETOUCH ULTRA VI STRP: ORAL_STRIP | 11 refills | Status: DC

## 2021-01-04 DIAGNOSIS — B181 Chronic viral hepatitis B without delta-agent: Secondary | ICD-10-CM | POA: Diagnosis not present

## 2021-01-07 ENCOUNTER — Ambulatory Visit (INDEPENDENT_AMBULATORY_CARE_PROVIDER_SITE_OTHER): Payer: Medicare Other | Admitting: Family Medicine

## 2021-01-07 ENCOUNTER — Encounter: Payer: Self-pay | Admitting: Family Medicine

## 2021-01-07 VITALS — BP 140/90 | HR 93 | Temp 98.5°F | Resp 16 | Ht 65.0 in | Wt 121.5 lb

## 2021-01-07 DIAGNOSIS — F3341 Major depressive disorder, recurrent, in partial remission: Secondary | ICD-10-CM

## 2021-01-07 DIAGNOSIS — I10 Essential (primary) hypertension: Secondary | ICD-10-CM | POA: Diagnosis not present

## 2021-01-07 DIAGNOSIS — L298 Other pruritus: Secondary | ICD-10-CM | POA: Diagnosis not present

## 2021-01-07 DIAGNOSIS — B354 Tinea corporis: Secondary | ICD-10-CM | POA: Diagnosis not present

## 2021-01-07 DIAGNOSIS — B181 Chronic viral hepatitis B without delta-agent: Secondary | ICD-10-CM

## 2021-01-07 DIAGNOSIS — Z23 Encounter for immunization: Secondary | ICD-10-CM

## 2021-01-07 DIAGNOSIS — L409 Psoriasis, unspecified: Secondary | ICD-10-CM

## 2021-01-07 DIAGNOSIS — L2989 Other pruritus: Secondary | ICD-10-CM

## 2021-01-07 MED ORDER — FLUCONAZOLE 150 MG PO TABS: 150.0000 mg | ORAL_TABLET | ORAL | 0 refills | Status: AC

## 2021-01-07 MED ORDER — KETOCONAZOLE 2 % EX SHAM: 1.0000 "application " | MEDICATED_SHAMPOO | Freq: Every day | CUTANEOUS | 0 refills | Status: DC | PRN

## 2021-01-07 MED ORDER — FLUOCINONIDE 0.05 % EX OINT: TOPICAL_OINTMENT | CUTANEOUS | 1 refills | Status: DC

## 2021-01-07 NOTE — Patient Instructions (Addendum)
A few things to remember from today's visit:  Pruritic erythematous rash - Plan: fluocinonide ointment (LIDEX) 0.05 %  Tinea corporis - Plan: ketoconazole (NIZORAL) 2 % shampoo, fluconazole (DIFLUCAN) 150 MG tablet  Psoriasis  If you need refills please call your pharmacy. Do not use My Chart to request refills or for acute issues that need immediate attention.   I am treating like fungal rash but can be psoriasis exacerbation. Please call your dermatologist and arranged appt.  Continue monitoring blood pressure, no changes today.  Please be sure medication list is accurate. If a new problem present, please set up appointment sooner than planned today.

## 2021-01-07 NOTE — Progress Notes (Signed)
Chief Complaint  Patient presents with   Eczema   itching   HPI: [name] is a 65 y.o. male with hx of allergies,HTN,HLD,insomnia,anxiety,depression,DM II,and myasthenia gravis here today complaining of pruritic skin rash for the past 2 months , intermittent. "Itchy bumps" then become flat and mildly scaly. Problem is affecting scalp,neck,back,upper extremities,chest,and LE's. Pruritus is interfering with sleep. Rash This is a new problem. The current episode started more than 1 month ago. The problem has been waxing and waning since onset. The rash is diffuse. He was exposed to nothing. Associated symptoms include fatigue. Pertinent negatives include no anorexia, congestion, cough, diarrhea, eye pain, facial edema, fever, joint pain, rhinorrhea, shortness of breath, sore throat or vomiting. Past treatments include topical steroids. The treatment provided mild relief. His past medical history is significant for allergies and eczema.  Negative for new medication, detergent, soap, or body product. No known insect bite or outdoor exposures to plants. No sick contact. Hx of eczema but no similar rash in the past.He follows with dermatologist. He uses Triamcinolone cream as needed.  Requesting refills on Fluocinonide oint, which he uses as needed for pruritic skin lesions that do not seem related to eczema but chronic. Last refill in 10/2020 60 g/1, he already ran out. Topical steroid helps with pruritus temporarily.  On chronic prednisone use for myasthenia gravis, dose has been tapered down, currently on 7.5 mg daily.  He has noted that BP has been higher since rash started. Usually BP's 120's/80's, has had 140's/90's. He is on Diltiazem 120 mg bid, which has also help with sinus tach. Negative for severe/frequent headache, visual changes, chest pain, dyspnea, palpitation, focal weakness, or edema.  Depression and anxiety on Fluoxetine 20 mg daily. Chronic hepatitis B, follows with  Dr Zollie Scale, hepatologist,last seen 01/04/21.He is on Entecavir 1 mg and routine hepatocellular carcinoma screening.  Review of Systems  Constitutional:  Positive for fatigue. Negative for appetite change, chills and fever.  HENT:  Negative for congestion, rhinorrhea and sore throat.   Eyes:  Negative for pain, discharge and redness.  Respiratory:  Negative for cough, shortness of breath and wheezing.   Gastrointestinal:  Negative for anorexia, diarrhea and vomiting.  Genitourinary:  Negative for decreased urine volume and hematuria.  Musculoskeletal:  Negative for gait problem, joint pain and myalgias.  Skin:  Positive for rash.  Allergic/Immunologic: Positive for environmental allergies.  Neurological:  Negative for syncope and facial asymmetry.  Psychiatric/Behavioral:  Positive for sleep disturbance. Negative for confusion. The patient is nervous/anxious.   Rest see pertinent positives and negatives per HPI.  Current Outpatient Medications on File Prior to Visit  Medication Sig Dispense Refill   ampicillin (PRINCIPEN) 500 MG capsule Take by mouth.     calcium carbonate (OS-CAL) 600 MG TABS Take 600 mg by mouth 3 (three) times daily with meals.     Cholecalciferol (VITAMIN D) 2000 UNITS tablet Take 2,000 Units by mouth daily.     colesevelam (WELCHOL) 625 MG tablet Take 3 tablets (1,875 mg total) by mouth daily. 270 tablet 3   desonide (DESONATE) 0.05 % gel Apply 1 application topically 2 (two) times daily as needed.      diltiazem (CARDIZEM SR) 120 MG 12 hr capsule Take 1 capsule (120 mg total) by mouth 2 (two) times daily. 180 capsule 2   entecavir (BARACLUDE) 1 MG tablet Take 1 tablet (1 mg total) by mouth daily. 30 tablet 6   FLUoxetine (PROZAC) 20 MG capsule Take 1 capsule (20 mg total)  by mouth daily. 90 capsule 3   glucose blood (ONETOUCH ULTRA) test strip USE TO TEST BLOOD SUGAR 3 TIMES DAILY 100 strip 11   JANUVIA 100 MG tablet TAKE ONE TABLET BY MOUTH DAILY 90 tablet 3   Lancets  (ONETOUCH ULTRASOFT) lancets USE AS INSTRUCTED TO CHECK BLOOD SUGARS THREE TIMES A DAY 200 each 3   metFORMIN (GLUCOPHAGE-XR) 500 MG 24 hr tablet TAKE TWO TABLETS BY MOUTH TWICE A DAY 360 tablet 0   omeprazole (PRILOSEC) 20 MG capsule TAKE ONE CAPSULE BY MOUTH DAILY 90 capsule 1   pimecrolimus (ELIDEL) 1 % cream Apply 1 application topically 2 (two) times daily as needed.      predniSONE (DELTASONE) 5 MG tablet Take 7.5 mg by mouth as directed.     repaglinide (PRANDIN) 0.5 MG tablet TAKE ONE TABLET BY MOUTH THREE TIMES A DAY BEFORE MEALS.  TAKE ONLY IF BLOOD SUGAR IS OVER 200 270 tablet 10   zolpidem (AMBIEN) 10 MG tablet TAKE 1/2 TO 1 TABLET BY MOUTH EVERY NIGHT AT BEDTIME AS NEEDED 30 tablet 2   No current facility-administered medications on file prior to visit.   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)    Type II or unspecified type diabetes mellitus without mention of complication, not stated as uncontrolled    Viral hepatitis B without mention of hepatic coma, chronic, without mention of hepatitis delta    Allergies  Allergen Reactions   Azithromycin Other (See Comments)    REACTION: exac of his mg   Beta Adrenergic Blockers Other (See Comments)    REACTION: exac of mg    Social History   Socioeconomic History   Marital status: Married    Spouse name: Not on file   Number of children: Not on file   Years of education: Not on file   Highest education level: Not on file  Occupational History   Not on file  Tobacco Use   Smoking status: Never   Smokeless tobacco: Never  Substance and Sexual Activity   Alcohol use: No   Drug use: No   Sexual activity: Not on file  Other Topics Concern    Not on file  Social History Narrative   Not on file   Social Determinants of Health   Financial Resource Strain: Not on file  Food Insecurity: Not on file  Transportation Needs: Not on file  Physical Activity: Not on file  Stress: Not on file  Social Connections: Not on file   Vitals:   01/07/21 1120  BP: 140/90  Pulse: 93  Resp: 16  Temp: 98.5 F (36.9 C)  SpO2: 98%   Body mass index is 20.22 kg/m.  Physical Exam Vitals and nursing note reviewed.  Constitutional:      General: He is not in acute distress.    Appearance: He is well-developed.  HENT:     Head: Normocephalic and atraumatic.     Mouth/Throat:     Mouth: Mucous membranes are moist.     Pharynx: Oropharynx is clear.  Eyes:     Conjunctiva/sclera: Conjunctivae normal.  Neck:     Trachea: No tracheal deviation.  Cardiovascular:  Rate and Rhythm: Normal rate and regular rhythm.     Heart sounds: No murmur heard. Pulmonary:     Effort: Pulmonary effort is normal. No respiratory distress.     Breath sounds: Normal breath sounds.  Lymphadenopathy:     Cervical: No cervical adenopathy.  Skin:    General: Skin is warm.     Findings: Rash present. No erythema.     Comments: Diffuse erythematous rash affecting scalp,neck,upper chest,abdomen,back,and LE's.Fine scaly layer on lesions. Defined borders. On scalp and upper posterior neck with small plaques.  Scratching marks on upper back.  Neurological:     General: No focal deficit present.     Mental Status: He is alert and oriented to person, place, and time.     Gait: Gait normal.  Psychiatric:        Mood and Affect: Mood is anxious.     Comments: Well groomed, good eye contact.   ASSESSMENT AND PLAN:  [name] was seen today for eczema and itching.  Diagnoses and all orders for this visit:  Pruritic erythematous rash He is reporting this rash as a new problem. We discussed possible etiologies: Seborrheic dermatitis,tinea,and  psoriasis are in the differential. He is on chronic prednisone treatment at this time. Fluocinonide ointment refilled but instructed to use a small amount, side effects of chronic topical steroid discussed.Recommend to request further refills from his dermatologist.  -     fluocinonide ointment (LIDEX) 0.05 %; APPLY TO AFFECTED AREA(S) OF THE SKIN daily AS NEEDED  Tinea corporis Will treat empirically as tinea. Diflucan side effects discussed. Nizoral shampoo daily x 10 min and then rinse. Instructed to arrange appt with his dermatologist.  -     ketoconazole (NIZORAL) 2 % shampoo; Apply 1 application topically daily as needed for irritation. -     fluconazole (DIFLUCAN) 150 MG tablet; Take 1 tablet (150 mg total) by mouth once a week for 2 doses.  Hypertension, essential, benign BP today slightly elevated. For now no changes in Diltiazem 120 mg bid. Low salt diet. Continue monitoring BP regularly.  Need for influenza vaccination -     Flu Vaccine QUAD 5mo+IM (Fluarix, Fluzone & Alfiuria Quad PF)  Recurrent major depressive disorder, in partial remission (HCC) Stable. Continue Fluoxetine 20 mg daily.  Chronic hepatitis B virus infection ([place]) Following with hepatologist annually.  Return if symptoms worsen or fail to improve, for Keep next appt..  [name] G. [place], MD  [facility] [place] office.

## 2021-01-09 ENCOUNTER — Encounter: Payer: Self-pay | Admitting: Family Medicine

## 2021-01-20 ENCOUNTER — Other Ambulatory Visit: Payer: Self-pay | Admitting: Family Medicine

## 2021-01-20 DIAGNOSIS — F3341 Major depressive disorder, recurrent, in partial remission: Secondary | ICD-10-CM

## 2021-01-20 DIAGNOSIS — F419 Anxiety disorder, unspecified: Secondary | ICD-10-CM

## 2021-02-07 ENCOUNTER — Telehealth: Payer: Self-pay | Admitting: Family Medicine

## 2021-02-07 ENCOUNTER — Other Ambulatory Visit: Payer: Self-pay | Admitting: Dermatology

## 2021-02-07 DIAGNOSIS — L309 Dermatitis, unspecified: Secondary | ICD-10-CM | POA: Diagnosis not present

## 2021-02-07 DIAGNOSIS — L308 Other specified dermatitis: Secondary | ICD-10-CM | POA: Diagnosis not present

## 2021-02-07 NOTE — Telephone Encounter (Signed)
Dermatology called because they seen patient today for a rash. Patient mentioned that blood pressure medication could cause rash and Dr.Haverstock wanted to know if patient could be taken off medication for a couple weeks to see if rash would go away.     Good callback number is [phone] Ext [phone]     Please advise

## 2021-02-08 LAB — DERMATOPATHOLOGY REPORT

## 2021-02-08 LAB — DERMPATH, SPECIMEN A

## 2021-02-08 NOTE — Telephone Encounter (Signed)
We could wean off Diltiazem but we will have to start another medication to manage HTN. He is allergic to beta blockers. Amlodipine,Benicar,and HCTZ are some of the options.  Thanks, BJ

## 2021-02-09 NOTE — Telephone Encounter (Signed)
Per Katharine Look dr Renda Rolls would like pt to stop taking any bp meds to see if the rash would resolved. Please call sandra back today at [phone] ext [phone]

## 2021-02-09 NOTE — Telephone Encounter (Signed)
I spoke with patient, he is okay with starting a new medication to replace the diltiazem.

## 2021-02-09 NOTE — Telephone Encounter (Signed)
Katharine Look called to clarify that, per Dr.Haverstock ,patient does not have to stop BP medication completely, just needed a change in the class of drugs  Katharine Look states she does not need a callback      Please advise

## 2021-02-13 ENCOUNTER — Other Ambulatory Visit: Payer: Self-pay | Admitting: Family Medicine

## 2021-02-13 DIAGNOSIS — I10 Essential (primary) hypertension: Secondary | ICD-10-CM

## 2021-02-13 MED ORDER — OLMESARTAN MEDOXOMIL 20 MG PO TABS: 20.0000 mg | ORAL_TABLET | Freq: Every day | ORAL | 1 refills | Status: DC

## 2021-02-13 NOTE — Telephone Encounter (Signed)
Stop Diltiazem and start Benicar 20 mg daily. Monitor BP and HR regularly. BMP 7 days after starting new medication. Rx sent and lab order placed. 4 weeks f/u. Thanks, BJ

## 2021-02-14 NOTE — Telephone Encounter (Signed)
Pt is aware. He will call back mid week to make appt for lab & f/u appt.

## 2021-02-24 ENCOUNTER — Other Ambulatory Visit: Payer: Self-pay | Admitting: Endocrinology

## 2021-03-02 NOTE — Progress Notes (Signed)
Chief Complaint  Patient presents with   Follow-up    Blood pressure check, changed hypertension medication on 1/15 due to possibility of diltiazem causing rash.    HPI: [name] is a 66 y.o. male, who is here today for follow up. He was last seen on 01/07/21 for worsening pruritic skin rash. He has seen his dermatologist and it was recommended stopping Diltiazem because it may have been causing problem. He was started on Benicar 20 mg daily.  BP at home 115-120's/70-90. Before taking medication SBP is 140's. Taking medication around noon. Negative for severe/frequent headache, visual changes, chest pain, dyspnea, palpitation, claudication, focal weakness, or edema. Sinus tach, his HR in the upper 90's, occasionally slightly above 100/min. Because hx of myasthenia gravis he has been instructed to avoid BB's.  Rash is improving, so sleeping better. Next appt with derm if needed, he was intructed to continue following annually if rash resolved.  Insomnia: He is on Ambien 10 mg daily, takes med most nights.  Sleeps 6-7 hours when he takes medication.  Anxiety on Fluoxetine 20 mg daily, which is still helping.Problem exacerbated by chronic medical problems. He has been on medication for years.  Depression screen [facility] 2/9 03/04/2021 01/07/2021 12/27/2019 12/21/2018 09/17/2018  Decreased Interest 2 1 0 0 0  Down, Depressed, Hopeless 2 1 0 0 0  PHQ - 2 Score 4 2 0 0 0  Altered sleeping 2 1 0 - -  Tired, decreased energy 2 1 0 - -  Change in appetite 1 1 0 - -  Feeling bad or failure about yourself  2 1 0 - -  Trouble concentrating 3 3 1  - -  Moving slowly or fidgety/restless 2 0 0 - -  Suicidal thoughts 1 0 0 - -  PHQ-9 Score 17 9 1  - -  Difficult doing work/chores Very difficult - Somewhat difficult - -  Some recent data might be hidden   HLD on Welchor 625 mg 3 tabs daily.  Component     Latest Ref Rng & Units 12/25/2016  Cholesterol     0 - 200 mg/dL 170   Triglycerides     0.0 - 149.0 mg/dL 209.0 (H)  HDL Cholesterol     >39.00 mg/dL 48.40  VLDL     0.0 - 40.0 mg/dL 41.8 (H)  Total CHOL/HDL Ratio      4  NonHDL      121.18   DM II, following with endocrinologist. On chronic prednisone. He has 2 metformin listed on med list, not sure which one he is taking. Lab Results  Component Value Date   CHOL 170 12/25/2016   HDL 48.40 12/25/2016   LDLCALC 68 07/23/2008   LDLDIRECT 87.0 12/25/2016   TRIG 209.0 (H) 12/25/2016   CHOLHDL 4 12/25/2016   He saw ID last on 07/12/2021, he is inquiring about LFT and hep B ab results. Negative for abdominal pain,N/V,or jaundice. Currently suppressed on entecavir 1 mg daily.Marland Kitchen He has liver US q 6 months to screen for hepatocellular cancer.  Review of Systems  Constitutional:  Positive for fatigue. Negative for activity change, appetite change and fever.  HENT:  Negative for nosebleeds, sore throat and trouble swallowing.   Eyes:  Negative for visual disturbance.  Respiratory:  Negative for cough and wheezing.   Gastrointestinal:  Negative for abdominal pain, nausea and vomiting.  Genitourinary:  Negative for decreased urine volume and hematuria.  Musculoskeletal:  Positive for arthralgias.  Neurological:  Negative for syncope  and facial asymmetry.  Psychiatric/Behavioral:  Positive for sleep disturbance. Negative for confusion. The patient is nervous/anxious.   Rest see pertinent positives and negatives per HPI.  Current Outpatient Medications on File Prior to Visit  Medication Sig Dispense Refill   ampicillin (PRINCIPEN) 500 MG capsule Take by mouth.     calcium carbonate (OS-CAL) 600 MG TABS Take 600 mg by mouth 3 (three) times daily with meals.     Cholecalciferol (VITAMIN D) 2000 UNITS tablet Take 2,000 Units by mouth daily.     colesevelam (WELCHOL) 625 MG tablet Take 3 tablets (1,875 mg total) by mouth daily. 270 tablet 3   desonide (DESONATE) 0.05 % gel Apply 1 application topically 2  (two) times daily as needed.      entecavir (BARACLUDE) 1 MG tablet Take 1 tablet (1 mg total) by mouth daily. 30 tablet 6   fluocinonide ointment (LIDEX) 0.05 % APPLY TO AFFECTED AREA(S) OF THE SKIN daily AS NEEDED 60 g 1   FLUoxetine (PROZAC) 20 MG capsule TAKE ONE CAPSULE BY MOUTH DAILY 90 capsule 3   glucose blood (ONETOUCH ULTRA) test strip USE TO TEST BLOOD SUGAR 3 TIMES DAILY 100 strip 11   JANUVIA 100 MG tablet TAKE ONE TABLET BY MOUTH DAILY 90 tablet 3   ketoconazole (NIZORAL) 2 % shampoo Apply 1 application topically daily as needed for irritation. 120 mL 0   Lancets (ONETOUCH ULTRASOFT) lancets USE AS INSTRUCTED TO CHECK BLOOD SUGARS THREE TIMES A DAY 200 each 3   metFORMIN (GLUCOPHAGE) 500 MG tablet TAKE TWO TABLETS BY MOUTH TWICE A DAY 360 tablet 2   metFORMIN (GLUCOPHAGE-XR) 500 MG 24 hr tablet TAKE TWO TABLETS BY MOUTH TWICE A DAY 360 tablet 0   olmesartan (BENICAR) 20 MG tablet Take 1 tablet (20 mg total) by mouth daily. 30 tablet 1   omeprazole (PRILOSEC) 20 MG capsule TAKE ONE CAPSULE BY MOUTH DAILY 90 capsule 1   pimecrolimus (ELIDEL) 1 % cream Apply 1 application topically 2 (two) times daily as needed.      predniSONE (DELTASONE) 5 MG tablet Take 7.5 mg by mouth as directed.     repaglinide (PRANDIN) 0.5 MG tablet TAKE ONE TABLET BY MOUTH THREE TIMES A DAY BEFORE MEALS.  TAKE ONLY IF BLOOD SUGAR IS OVER 200 270 tablet 10   zolpidem (AMBIEN) 10 MG tablet TAKE 1/2 TO 1 TABLET BY MOUTH EVERY NIGHT AT BEDTIME AS NEEDED 30 tablet 2   No current facility-administered medications on file prior to visit.    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)    Type II or unspecified type diabetes  mellitus without mention of complication, not stated as uncontrolled    Viral hepatitis B without mention of hepatic coma, chronic, without mention of hepatitis delta    Allergies  Allergen Reactions   Azithromycin Other (See Comments)    REACTION: exac of his mg   Beta Adrenergic Blockers Other (See Comments)    REACTION: exac of mg    Social History   Socioeconomic History   Marital status: Married    Spouse name: Not on file   Number of children: Not on file  Years of education: Not on file   Highest education level: Not on file  Occupational History   Not on file  Tobacco Use   Smoking status: Never   Smokeless tobacco: Never  Substance and Sexual Activity   Alcohol use: No   Drug use: No   Sexual activity: Not on file  Other Topics Concern   Not on file  Social History Narrative   Not on file   Social Determinants of Health   Financial Resource Strain: Not on file  Food Insecurity: Not on file  Transportation Needs: Not on file  Physical Activity: Not on file  Stress: Not on file  Social Connections: Not on file    Vitals:   03/04/21 0749  BP: 120/70  Pulse: 99  Resp: 16  SpO2: 98%   Body mass index is 20.39 kg/m.  Physical Exam Nursing note reviewed.  Constitutional:      General: He is not in acute distress.    Appearance: He is well-developed.  HENT:     Head: Normocephalic and atraumatic.  Eyes:     Conjunctiva/sclera: Conjunctivae normal.  Cardiovascular:     Rate and Rhythm: Normal rate and regular rhythm.     Pulses:          Dorsalis pedis pulses are 2+ on the right side and 2+ on the left side.     Heart sounds: No murmur heard. Pulmonary:     Effort: Pulmonary effort is normal. No respiratory distress.     Breath sounds: Normal breath sounds.  Abdominal:     Palpations: Abdomen is soft. There is no hepatomegaly or mass.     Tenderness: There is no abdominal tenderness.  Lymphadenopathy:     Cervical: No cervical adenopathy.   Skin:    General: Skin is warm.     Findings: No erythema or rash.  Neurological:     Mental Status: He is alert and oriented to person, place, and time.     Cranial Nerves: No cranial nerve deficit.     Gait: Gait normal.  Psychiatric:        Mood and Affect: Mood is anxious.     Comments: Well groomed, good eye contact.   ASSESSMENT AND PLAN:  [name] was seen today for follow-up.  Diagnoses and all orders for this visit: Orders Placed This Encounter  Procedures   Basic metabolic panel   Lipid panel   LDL cholesterol, direct   Lab Results  Component Value Date   CREATININE 1.11 03/04/2021   BUN 11 03/04/2021   NA 132 (L) 03/04/2021   K 4.1 03/04/2021   CL 95 (L) 03/04/2021   CO2 32 03/04/2021   Lab Results  Component Value Date   CHOL 162 03/04/2021   HDL 47.00 03/04/2021   LDLCALC 68 07/23/2008   LDLDIRECT 95.0 03/04/2021   TRIG 208.0 (H) 03/04/2021   CHOLHDL 3 03/04/2021   HYPERCHOLESTEROLEMIA He is not on statin medication. Taking Welchor. Continue low fat diet. Further recommendations according to lab results.  INSOMNIA Problem is stable. Continue Ambien 10 mg at bedtime prn. Good sleep hygiene recommended.  Major depression in partial remission (HCC) Stable. Continue Fluoxetine 20 mg daily.   Hypertension, essential, benign BP adequately controlled. Continue Olmesartan 20 mg daily. Low salt diet recommended. Continue monitoring BP regularly.   Chronic hepatitis B virus infection ([place]) I could not see results of recent labs, recommend calling his ID's office for results. Currently on entecavir. Having liver US  q 6 months and following with ID.  Anxiety disorder, unspecified Stable. Continue Fluoxetine 20 mg daily.  Type 2 diabetes mellitus with other specified complication [facility]) He is not sure which Metformin he is taking, he is going to check his meds at home and let us know, so we can update his med list. Continue following with  endocrinologist.  Sinus tachycardia Instructed to continue monitoring HR at home.  I spent a total of 42 minutes in both face to face and non face to face activities for this visit on the date of this encounter. During this time history was obtained and documented, examination was performed, prior labs reviewed, and assessment/plan discussed.  Return in about 6 months (around 09/01/2021) for Can cancel appt in 04/2021 and schedule for late 07/2021.Marland Kitchen   Amarrah Meinhart G. Martinique, MD  [facility]. [place] office.

## 2021-03-04 ENCOUNTER — Ambulatory Visit (INDEPENDENT_AMBULATORY_CARE_PROVIDER_SITE_OTHER): Payer: Medicare Other | Admitting: Family Medicine

## 2021-03-04 ENCOUNTER — Encounter: Payer: Self-pay | Admitting: Family Medicine

## 2021-03-04 VITALS — BP 120/70 | HR 99 | Resp 16 | Ht 65.0 in | Wt 122.5 lb

## 2021-03-04 DIAGNOSIS — R000 Tachycardia, unspecified: Secondary | ICD-10-CM | POA: Diagnosis not present

## 2021-03-04 DIAGNOSIS — F3341 Major depressive disorder, recurrent, in partial remission: Secondary | ICD-10-CM | POA: Diagnosis not present

## 2021-03-04 DIAGNOSIS — E7800 Pure hypercholesterolemia, unspecified: Secondary | ICD-10-CM | POA: Diagnosis not present

## 2021-03-04 DIAGNOSIS — E1169 Type 2 diabetes mellitus with other specified complication: Secondary | ICD-10-CM

## 2021-03-04 DIAGNOSIS — G4700 Insomnia, unspecified: Secondary | ICD-10-CM

## 2021-03-04 DIAGNOSIS — F419 Anxiety disorder, unspecified: Secondary | ICD-10-CM

## 2021-03-04 DIAGNOSIS — B181 Chronic viral hepatitis B without delta-agent: Secondary | ICD-10-CM | POA: Diagnosis not present

## 2021-03-04 DIAGNOSIS — I10 Essential (primary) hypertension: Secondary | ICD-10-CM

## 2021-03-04 LAB — BASIC METABOLIC PANEL
BUN: 11 mg/dL (ref 6–23)
CO2: 32 mEq/L (ref 19–32)
Calcium: 9.9 mg/dL (ref 8.4–10.5)
Chloride: 95 mEq/L — ABNORMAL LOW (ref 96–112)
Creatinine, Ser: 1.11 mg/dL (ref 0.40–1.50)
GFR: 70.12 mL/min (ref >60.00)
Glucose, Bld: 94 mg/dL (ref 70–99)
Potassium: 4.1 mEq/L (ref 3.5–5.1)
Sodium: 132 mEq/L — ABNORMAL LOW (ref 135–145)

## 2021-03-04 LAB — LIPID PANEL
Cholesterol: 162 mg/dL (ref 0–200)
HDL: 47 mg/dL (ref >39.00)
NonHDL: 115.15
Total CHOL/HDL Ratio: 3
Triglycerides: 208 mg/dL — ABNORMAL HIGH (ref 0.0–149.0)
VLDL: 41.6 mg/dL — ABNORMAL HIGH (ref 0.0–40.0)

## 2021-03-04 LAB — LDL CHOLESTEROL, DIRECT: Direct LDL: 95 mg/dL

## 2021-03-04 NOTE — Assessment & Plan Note (Addendum)
I could not see results of recent labs, recommend calling his ID's office for results. Currently on entecavir. Having liver US q 6 months and following with ID.

## 2021-03-04 NOTE — Assessment & Plan Note (Signed)
BP adequately controlled. Continue Olmesartan 20 mg daily. Low salt diet recommended. Continue monitoring BP regularly.

## 2021-03-04 NOTE — Assessment & Plan Note (Addendum)
He is not sure which Metformin he is taking, he is going to check his meds at home and let us know, so we can update his med list. Continue following with endocrinologist.

## 2021-03-04 NOTE — Assessment & Plan Note (Signed)
Problem is stable. Continue Ambien 10 mg at bedtime prn. Good sleep hygiene recommended.

## 2021-03-04 NOTE — Assessment & Plan Note (Signed)
Stable. Continue Fluoxetine 20 mg daily.

## 2021-03-04 NOTE — Assessment & Plan Note (Signed)
Instructed to continue monitoring HR at home.

## 2021-03-04 NOTE — Patient Instructions (Addendum)
A few things to remember from today's visit:  Hypertension, essential, benign - Plan: Basic metabolic panel  HYPERCHOLESTEROLEMIA - Plan: Lipid panel  If you need refills please call your pharmacy. Do not use My Chart to request refills or for acute issues that need immediate attention.   Continue monitoring blood pressure and pay attention to heart rate. No changes in rest of meds. Check your meds and let us know which metformin you are taking now. As far as heart rate is under 100 and blood pressure under 140/90 consistently, I can see you in 5-6 months.  Please be sure medication list is accurate. If a new problem present, please set up appointment sooner than planned today.

## 2021-03-16 ENCOUNTER — Other Ambulatory Visit: Payer: Self-pay | Admitting: Family Medicine

## 2021-03-16 DIAGNOSIS — L282 Other prurigo: Secondary | ICD-10-CM

## 2021-04-05 ENCOUNTER — Other Ambulatory Visit: Payer: Self-pay | Admitting: Family Medicine

## 2021-04-05 DIAGNOSIS — I10 Essential (primary) hypertension: Secondary | ICD-10-CM

## 2021-04-21 ENCOUNTER — Other Ambulatory Visit: Payer: Self-pay | Admitting: Endocrinology

## 2021-05-02 ENCOUNTER — Encounter: Payer: Self-pay | Admitting: Endocrinology

## 2021-05-02 ENCOUNTER — Ambulatory Visit (INDEPENDENT_AMBULATORY_CARE_PROVIDER_SITE_OTHER): Payer: Medicare Other | Admitting: Endocrinology

## 2021-05-02 VITALS — BP 142/88 | HR 93 | Ht 65.0 in | Wt 124.0 lb

## 2021-05-02 DIAGNOSIS — E119 Type 2 diabetes mellitus without complications: Secondary | ICD-10-CM

## 2021-05-02 DIAGNOSIS — E1169 Type 2 diabetes mellitus with other specified complication: Secondary | ICD-10-CM

## 2021-05-02 LAB — POCT GLYCOSYLATED HEMOGLOBIN (HGB A1C): Hemoglobin A1C: 5.9 % — AB (ref 4.0–5.6)

## 2021-05-02 NOTE — Patient Instructions (Signed)
Please continue the same 4 diabetes medications.  ?check your blood sugar once a day.  vary the time of day when you check, between before the 3 meals, and at bedtime.  also check if you have symptoms of your blood sugar being too high or too low.  please keep a record of the readings and bring it to your next appointment here (or you can bring the meter itself).  You can write it on any piece of paper.  please call us sooner if your blood sugar goes below 70, or if you have a lot of readings over 200.   ?Please come back for a follow-up appointment in 6 months.   ?

## 2021-05-02 NOTE — Progress Notes (Signed)
? ?Subjective:  ? ? Patient ID: [name], male    DOB: 01-Feb-1955, 66 y.o.   MRN: [id number] ? ?HPI ?Pt returns for f/u of diabetes mellitus:  ?DM type: 2, but he is presumed to be evolving type 1, given lean body habitus, MG, psoriasis, and neg FHx of DM.   ?Dx'ed: 2005 ?Complications: none ?Therapy: 4 oral meds.   ?DKA: never ?Severe hypoglycemia: never.  ?Pancreatitis: never.  ?Other: he has never been on insulin.  ?Interval history: He says cbg's vary from 100-220.  pt states he feels well in general.  Prednisone is decreasing from 10 to 7.5 mg/d.  ?Pt has had fluctuating levels of Ca++ and PTH (sometimes low, sometimes normal; since 2015; Vit-D has been persistently normal on rx; these were normal: Vit-A, PTH-RP, Mg++, and 24-HR urine Ca++).  He takes Vit-D, 1000 units/day.   ?Past Medical History:  ?Diagnosis Date  ? Anxiety   ? Colon polyp   ? Depressive disorder, not elsewhere classified   ? External hemorrhoid   ? Insomnia, unspecified   ? Internal hemorrhoids   ? Myasthenia gravis without exacerbation ([place])   ? Other and unspecified hyperlipidemia   ? 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 Type II or unspecified type diabetes mellitus without mention of complication, not stated as uncontrolled   ? Viral hepatitis B without mention of hepatic coma, chronic, without mention of hepatitis delta   ? ? ?Past Surgical History:  ?Procedure Laterality Date  ? THYMECTOMY    ? ? ?Social History  ? ?Socioeconomic History  ? Marital status: Married  ?  Spouse name: Not on file  ? Number of children: Not on file  ? Years of education: Not on file  ? Highest education level: Not on file  ?Occupational History  ? Not on file  ?Tobacco Use  ? Smoking status: Never  ? Smokeless tobacco: Never  ?Substance and Sexual Activity  ? Alcohol use: No  ? Drug use: No  ? Sexual activity: Not on file  ?Other Topics Concern  ? Not on file  ?Social History Narrative  ? Not on file  ? ?Social Determinants of Health   ? ?Financial Resource Strain: Not on file  ?Food Insecurity: Not on file  ?Transportation Needs: Not on file  ?Physical Activity: Not on file  ?Stress: Not on file  ?Social Connections: Not on file  ?Intimate Partner Violence: Not on file  ? ? ?Current Outpatient Medications on File Prior to Visit  ?Medication Sig Dispense Refill  ? ampicillin (PRINCIPEN) 500 MG capsule Take by mouth.    ? calcium carbonate (OS-CAL) 600 MG TABS Take 600 mg by mouth 3 (three) times daily with meals.    ? Cholecalciferol (VITAMIN D) 2000 UNITS tablet Take 2,000 Units by mouth daily.    ? colesevelam (WELCHOL) 625 MG tablet TAKE THREE TABLETS BY MOUTH DAILY 270 tablet 3  ? desonide (DESONATE) 0.05 % gel Apply 1 application topically 2 (two) times daily as needed.     ? entecavir (BARACLUDE) 1 MG tablet Take 1 tablet (1 mg total) by mouth daily. 30 tablet 6  ? fluocinonide ointment (LIDEX) 0.05 % APPLY TO AFFECTED AREA(S) OF THE SKIN daily AS NEEDED 60 g 1  ? FLUoxetine (PROZAC) 20 MG capsule TAKE ONE CAPSULE BY MOUTH DAILY 90 capsule 3  ? glucose blood (ONETOUCH ULTRA) test strip USE TO TEST BLOOD SUGAR 3 TIMES DAILY 100 strip 11  ? JANUVIA 100 MG  tablet TAKE ONE TABLET BY MOUTH DAILY 90 tablet 3  ? ketoconazole (NIZORAL) 2 % shampoo Apply 1 application topically daily as needed for irritation. 120 mL 0  ? Lancets (ONETOUCH ULTRASOFT) lancets USE AS INSTRUCTED TO CHECK BLOOD SUGARS THREE TIMES A DAY 200 each 3  ? metFORMIN (GLUCOPHAGE) 500 MG tablet TAKE TWO TABLETS BY MOUTH TWICE A DAY 360 tablet 2  ? metFORMIN (GLUCOPHAGE-XR) 500 MG 24 hr tablet TAKE TWO TABLETS BY MOUTH TWICE A DAY 360 tablet 0  ? olmesartan (BENICAR) 20 MG tablet TAKE ONE TABLET BY MOUTH DAILY *DISCONTINUE DILTIAZEM* 30 tablet 1  ? omeprazole (PRILOSEC) 20 MG capsule TAKE ONE CAPSULE BY MOUTH DAILY 90 capsule 1  ? pimecrolimus (ELIDEL) 1 % cream Apply 1 application topically 2 (two) times daily as needed.     ? predniSONE (DELTASONE) 5 MG tablet Take 7.5 mg by  mouth as directed.    ? repaglinide (PRANDIN) 0.5 MG tablet TAKE ONE TABLET BY MOUTH THREE TIMES A DAY BEFORE MEALS.  TAKE ONLY IF BLOOD SUGAR IS OVER 200 270 tablet 10  ? zolpidem (AMBIEN) 10 MG tablet TAKE 1/2 TO 1 TABLET BY MOUTH EVERY NIGHT AT BEDTIME AS NEEDED 30 tablet 2  ? ?No current facility-administered medications on file prior to visit.  ? ? ?Allergies  ?Allergen Reactions  ? Azithromycin Other (See Comments)  ?  REACTION: exac of his mg  ? Beta Adrenergic Blockers Other (See Comments)  ?  REACTION: exac of mg  ? ? ?No family history on file. ? ?BP (!) 142/88 (BP Location: Left Arm, Patient Position: Sitting, Cuff Size: Normal)   Pulse 93   Ht '5\' 5"'$  (1.651 m)   Wt 124 lb (56.2 kg)   SpO2 99%   BMI 20.63 kg/m?  ? ? ?Review of Systems ? ?   ?Objective:  ? Physical Exam ?VITAL SIGNS:  See vs page ?GENERAL: no distress ?Pulses: dorsalis pedis intact bilat.   ?MSK: no deformity of the feet ?CV: no leg edema.   ?Skin:  no ulcer on the feet.  normal color and temp on the feet.   ?Neuro: sensation is intact to touch on the feet.   ? ?Lab Results  ?Component Value Date  ? PTH 14 (L) 08/23/2020  ? CALCIUM 9.9 03/04/2021  ? CAION 1.11 (L) 03/11/2010  ? ? ?Lab Results  ?Component Value Date  ? HGBA1C 5.9 (A) 05/02/2021  ? ? ?   ?Assessment & Plan:  ?Type 2 DM: well-controlled.  ? ?Patient Instructions  ?Please continue the same 4 diabetes medications.  ?check your blood sugar once a day.  vary the time of day when you check, between before the 3 meals, and at bedtime.  also check if you have symptoms of your blood sugar being too high or too low.  please keep a record of the readings and bring it to your next appointment here (or you can bring the meter itself).  You can write it on any piece of paper.  please call us sooner if your blood sugar goes below 70, or if you have a lot of readings over 200.   ?Please come back for a follow-up appointment in 6 months.   ? ? ?

## 2021-05-04 ENCOUNTER — Telehealth: Payer: Self-pay | Admitting: Family Medicine

## 2021-05-04 NOTE — Telephone Encounter (Signed)
I left patient a voicemail to return my call. 

## 2021-05-04 NOTE — Telephone Encounter (Signed)
He was changed to [place] in 01/2021 because diltiazem was causing skin rash. ?During last follow up he did not report any problem with medication. ?Allergies can be caused by seasonal changes. ?I recommend trying OTC Zyrtec 10 mg daily and Nasacort nasal spray (if having nasal congestion) before we change his medications again. ?Thanks, ?BJ ? ?

## 2021-05-04 NOTE — Telephone Encounter (Signed)
Patient called in stating that olmesartan (BENICAR) 20 MG tablet [[id number]]  medication is causing his allergies to flare up every time he takes it. ? ?Please advise. ?

## 2021-05-09 NOTE — Telephone Encounter (Signed)
Patient returned my call. We went over the information below & he verbalized understanding. He will try the Zyrtec and if it does not improve, he will call back and we will move his appointment up. ?

## 2021-05-24 ENCOUNTER — Ambulatory Visit: Payer: Medicare Other | Admitting: Family Medicine

## 2021-05-25 ENCOUNTER — Other Ambulatory Visit: Payer: Self-pay | Admitting: Family Medicine

## 2021-05-27 ENCOUNTER — Other Ambulatory Visit: Payer: Self-pay | Admitting: Family Medicine

## 2021-05-27 DIAGNOSIS — G4700 Insomnia, unspecified: Secondary | ICD-10-CM

## 2021-06-04 ENCOUNTER — Other Ambulatory Visit: Payer: Self-pay | Admitting: Family Medicine

## 2021-06-04 DIAGNOSIS — I10 Essential (primary) hypertension: Secondary | ICD-10-CM

## 2021-06-05 ENCOUNTER — Other Ambulatory Visit: Payer: Self-pay | Admitting: Endocrinology

## 2021-07-05 ENCOUNTER — Other Ambulatory Visit: Payer: Self-pay

## 2021-07-05 DIAGNOSIS — E119 Type 2 diabetes mellitus without complications: Secondary | ICD-10-CM

## 2021-07-05 MED ORDER — SITAGLIPTIN PHOSPHATE 100 MG PO TABS: 100.0000 mg | ORAL_TABLET | Freq: Every day | ORAL | 0 refills | Status: DC

## 2021-07-25 ENCOUNTER — Ambulatory Visit: Payer: Medicare Other | Admitting: Endocrinology

## 2021-08-23 ENCOUNTER — Telehealth: Payer: Self-pay

## 2021-08-23 NOTE — Telephone Encounter (Signed)
Unsuccessful attempt to reach patient on preferred number listed in notes for scheduled AWV.  Left message on voicemail. Patient returned call to office @ 10:52a canceled appt.

## 2021-09-05 ENCOUNTER — Telehealth: Payer: Self-pay | Admitting: Family Medicine

## 2021-09-05 NOTE — Telephone Encounter (Signed)
Left message for patient to call back and schedule Medicare Annual Wellness Visit (AWV) either virtually or in office. Left  my [name] number [phone]   Last AWV ;09/17/18  please schedule at anytime with [facility] Nurse Health Advisor 1 or 2

## 2021-09-20 NOTE — Telephone Encounter (Signed)
Spoke with patient and he is declining the AWV and just wants to keep his appointment with Dr Martinique on 10/04/21

## 2021-09-20 NOTE — Telephone Encounter (Signed)
Pt has upcoming appt with PCP on 10/04/21.  LVM instructions for pt to call back. This RN would like to convert appt to AWV. If pt calls back, please see if this is ok & change appt accordingly.

## 2021-09-27 ENCOUNTER — Telehealth: Payer: Self-pay | Admitting: Family Medicine

## 2021-09-27 NOTE — Telephone Encounter (Signed)
Left message for patient to call back and schedule Medicare Annual Wellness Visit (AWV) either virtually or in office. Left  my [name] number [phone]   Last AWV 09/17/18 ; please schedule at anytime with [facility] Nurse Health Advisor 1 or 2

## 2021-10-03 ENCOUNTER — Other Ambulatory Visit: Payer: Self-pay | Admitting: Internal Medicine

## 2021-10-03 DIAGNOSIS — E119 Type 2 diabetes mellitus without complications: Secondary | ICD-10-CM

## 2021-10-04 ENCOUNTER — Encounter: Payer: Self-pay | Admitting: Family Medicine

## 2021-10-04 ENCOUNTER — Ambulatory Visit (INDEPENDENT_AMBULATORY_CARE_PROVIDER_SITE_OTHER): Payer: Medicare Other | Admitting: Family Medicine

## 2021-10-04 VITALS — BP 124/80 | HR 104 | Temp 98.8°F | Resp 12 | Ht 65.0 in | Wt 120.0 lb

## 2021-10-04 DIAGNOSIS — L298 Other pruritus: Secondary | ICD-10-CM

## 2021-10-04 DIAGNOSIS — F419 Anxiety disorder, unspecified: Secondary | ICD-10-CM | POA: Diagnosis not present

## 2021-10-04 DIAGNOSIS — I10 Essential (primary) hypertension: Secondary | ICD-10-CM | POA: Diagnosis not present

## 2021-10-04 DIAGNOSIS — G4700 Insomnia, unspecified: Secondary | ICD-10-CM | POA: Diagnosis not present

## 2021-10-04 DIAGNOSIS — R000 Tachycardia, unspecified: Secondary | ICD-10-CM

## 2021-10-04 DIAGNOSIS — Z23 Encounter for immunization: Secondary | ICD-10-CM

## 2021-10-04 DIAGNOSIS — F3341 Major depressive disorder, recurrent, in partial remission: Secondary | ICD-10-CM

## 2021-10-04 LAB — BASIC METABOLIC PANEL
BUN: 10 mg/dL (ref 6–23)
CO2: 29 mEq/L (ref 19–32)
Calcium: 9.9 mg/dL (ref 8.4–10.5)
Chloride: 94 mEq/L — ABNORMAL LOW (ref 96–112)
Creatinine, Ser: 0.95 mg/dL (ref 0.40–1.50)
GFR: 84.18 mL/min (ref >60.00)
Glucose, Bld: 70 mg/dL (ref 70–99)
Potassium: 3.9 mEq/L (ref 3.5–5.1)
Sodium: 131 mEq/L — ABNORMAL LOW (ref 135–145)

## 2021-10-04 LAB — TSH: TSH: 0.69 u[IU]/mL (ref 0.35–5.50)

## 2021-10-04 MED ORDER — FLUOCINONIDE 0.05 % EX OINT: TOPICAL_OINTMENT | CUTANEOUS | 1 refills | Status: DC

## 2021-10-04 NOTE — Assessment & Plan Note (Signed)
Problem is stable. Continue Ambien 10 mg at bedtime daily as needed. Good sleep hygiene. F/U in 9 months, before if needed.

## 2021-10-04 NOTE — Patient Instructions (Addendum)
A few things to remember from today's visit:  Hypertension, essential, benign  Insomnia, unspecified type  Sinus tachycardia  Anxiety disorder, unspecified type  If you need refills please call your pharmacy. Do not use My Chart to request refills or for acute issues that need immediate attention.   For itching you can apply a small amount of Desonide daily as needed. Continue following with dermatologist for skin issues.  Continue monitoring heart rate. No changes today.  Please be sure medication list is accurate. If a new problem present, please set up appointment sooner than planned today.

## 2021-10-04 NOTE — Assessment & Plan Note (Signed)
BP adequately controlled. Continue Benicar 20 mg daily and low salt diet. Monitor BP at home. Eye exam current.

## 2021-10-04 NOTE — Progress Notes (Signed)
HPI: [name] is a 66 y.o. male, who is here today for follow up. He was last seen on 03/04/21. Since his last visit he has seen endocrinologist. On chronic Prednisone, because myasthenia gravis exacerbation dose was increased from 7.5 mg to 10 mg daily.  Insomnia: He is on Ambien 10 mg daily. He does not take medication daily, it helps his sleep better, 6-7 hours.  Anxiety: Taking Fluoxetine 20 mg daily. Problem is exacerbated by health problems. Sometimes he thinks he would be better off death but has not thought about a plan.     10/04/2021    9:02 AM 03/04/2021    8:13 AM 01/07/2021   11:36 AM 12/27/2019    9:02 PM 12/21/2018    3:33 PM  Depression screen PHQ 2/9  Decreased Interest '2 2 1 '$ 0 0  Down, Depressed, Hopeless '2 2 1 '$ 0 0  PHQ - 2 Score '4 4 2 '$ 0 0  Altered sleeping '2 2 1 '$ 0   Tired, decreased energy '2 2 1 '$ 0   Change in appetite '1 1 1 '$ 0   Feeling bad or failure about yourself  '2 2 1 '$ 0   Trouble concentrating '3 3 3 1   '$ Moving slowly or fidgety/restless 1 2 0 0   Suicidal thoughts 1 1 0 0   PHQ-9 Score '16 17 9 1   '$ Difficult doing work/chores Somewhat difficult Very difficult  Somewhat difficult    Hypertension:  Medications:Benicar 20 mg daily. BP readings at home:120-130/70-80's.  Sinus tachycardia: HR at home most of the time < 100/min. Anxiety is an aggravating factor. He has tolerated Benicar well. Negative for unusual or severe headache, visual changes, exertional chest pain, dyspnea,  focal weakness, or edema.  Lab Results  Component Value Date   CREATININE 1.11 03/04/2021   BUN 11 03/04/2021   NA 132 (L) 03/04/2021   K 4.1 03/04/2021   CL 95 (L) 03/04/2021   CO2 32 03/04/2021   Review of Systems  Constitutional:  Positive for fatigue. Negative for activity change, appetite change and fever.  HENT:  Negative for nosebleeds, sore throat and trouble swallowing.   Respiratory:  Negative for cough and wheezing.   Gastrointestinal:  Negative for  abdominal pain, nausea and vomiting.  Genitourinary:  Negative for decreased urine volume, dysuria and hematuria.  Neurological:  Negative for syncope and facial asymmetry.  Psychiatric/Behavioral:  Positive for sleep disturbance. Negative for confusion and hallucinations. The patient is nervous/anxious.   Rest see pertinent positives and negatives per HPI.  Current Outpatient Medications on File Prior to Visit  Medication Sig Dispense Refill   ampicillin (PRINCIPEN) 500 MG capsule Take by mouth.     calcium carbonate (OS-CAL) 600 MG TABS Take 600 mg by mouth 3 (three) times daily with meals.     Cholecalciferol (VITAMIN D) 2000 UNITS tablet Take 2,000 Units by mouth daily.     colesevelam (WELCHOL) 625 MG tablet TAKE THREE TABLETS BY MOUTH DAILY 270 tablet 3   desonide (DESONATE) 0.05 % gel Apply 1 application topically 2 (two) times daily as needed.      entecavir (BARACLUDE) 1 MG tablet Take 1 tablet (1 mg total) by mouth daily. 30 tablet 6   FLUoxetine (PROZAC) 20 MG capsule TAKE ONE CAPSULE BY MOUTH DAILY 90 capsule 3   glucose blood (ONETOUCH ULTRA) test strip USE TO TEST BLOOD SUGAR 3 TIMES DAILY 100 strip 11   JANUVIA 100 MG tablet TAKE 1 TABLET BY MOUTH DAILY  90 tablet 0   ketoconazole (NIZORAL) 2 % shampoo Apply 1 application topically daily as needed for irritation. 120 mL 0   Lancets (ONETOUCH ULTRASOFT) lancets USE AS INSTRUCTED TO CHECK BLOOD SUGARS THREE TIMES A DAY 200 each 3   olmesartan (BENICAR) 20 MG tablet TAKE ONE TABLET BY MOUTH DAILY *DISCONTINUE DILTIAZEM* 90 tablet 1   omeprazole (PRILOSEC) 20 MG capsule TAKE ONE CAPSULE BY MOUTH DAILY 90 capsule 1   pimecrolimus (ELIDEL) 1 % cream Apply 1 application topically 2 (two) times daily as needed.      predniSONE (DELTASONE) 5 MG tablet Take 10 mg by mouth as directed.     pyridostigmine (MESTINON) 60 MG tablet Take 60 mg by mouth daily.     repaglinide (PRANDIN) 0.5 MG tablet TAKE ONE TABLET BY MOUTH THREE TIMES A DAY  BEFORE MEALS ONLY IF FOR BLOOD SUGAR IS OVER 200 270 tablet 10   zolpidem (AMBIEN) 10 MG tablet TAKE ONE-HALF TO ONE (0.5-1) TABLET BY MOUTH EVERY NIGHT AT BEDTIME AS NEEDED 30 tablet 3   No current facility-administered medications on file prior to visit.   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)    Type II or unspecified type diabetes mellitus without mention of complication, not stated as uncontrolled    Viral hepatitis B without mention of hepatic coma, chronic, without mention of hepatitis delta    Allergies  Allergen Reactions   Azithromycin Other (See Comments)    REACTION: exac of his mg   Beta Adrenergic Blockers Other (See Comments)    REACTION: exac of mg   Social History   Socioeconomic History   Marital status: Married    Spouse name: Not on file   Number of children: Not on file   Years of education: Not on file   Highest education level: Not on file  Occupational History   Not on file  Tobacco Use   Smoking status: Never   Smokeless tobacco: Never  Substance and Sexual Activity   Alcohol use: No   Drug use: No   Sexual activity: Not on file  Other Topics Concern   Not on file  Social History Narrative   Not on file   Social Determinants of Health   Financial Resource Strain: Not on file  Food Insecurity: Not on file  Transportation Needs: Not on file  Physical Activity: Not on file  Stress: Not on file  Social Connections: Not on file   Vitals:   10/04/21 0851  BP: 124/80  Pulse: (!) 104  Resp: 12  Temp: 98.8 F (37.1 C)  SpO2: 99%   Wt Readings from Last 3 Encounters:  10/04/21 120 lb (54.4 kg)  05/02/21 124 lb (56.2 kg)  03/04/21 122 lb 8 oz (55.6 kg)   Body mass index is 19.97 kg/m. Physical Exam Vitals and nursing note reviewed.  Constitutional:      General: He is not in acute distress.    Appearance: He is well-developed.  HENT:     Head: Normocephalic and atraumatic.     Mouth/Throat:     Mouth: Mucous membranes are dry.     Pharynx: Oropharynx is clear.  Eyes:  Conjunctiva/sclera: Conjunctivae normal.  Cardiovascular:     Rate and Rhythm: Regular rhythm. Tachycardia present.     Pulses:          Dorsalis pedis pulses are 2+ on the right side and 2+ on the left side.     Heart sounds: No murmur heard. Pulmonary:     Effort: Pulmonary effort is normal. No respiratory distress.     Breath sounds: Normal breath sounds.  Abdominal:     Palpations: Abdomen is soft. There is no hepatomegaly or mass.     Tenderness: There is no abdominal tenderness.  Lymphadenopathy:     Cervical: No cervical adenopathy.  Skin:    General: Skin is warm.     Findings: No erythema or rash.  Neurological:     General: No focal deficit present.     Mental Status: He is alert and oriented to person, place, and time.     Cranial Nerves: No cranial nerve deficit.     Gait: Gait normal.  Psychiatric:        Mood and Affect: Mood is anxious.        Thought Content: Thought content does not include suicidal ideation. Thought content does not include homicidal or suicidal plan.   ASSESSMENT AND PLAN:  [name] was seen today for follow-up.  Diagnoses and all orders for this visit: Orders Placed This Encounter  Procedures   Pneumococcal polysaccharide vaccine 23-valent greater than or equal to 2yo subcutaneous/IM   Basic metabolic panel   TSH   Lab Results  Component Value Date   TSH 0.69 10/04/2021   Lab Results  Component Value Date   CREATININE 0.95 10/04/2021   BUN 10 10/04/2021   NA 131 (L) 10/04/2021   K 3.9 10/04/2021   CL 94 (L) 10/04/2021   CO2 29 10/04/2021   INSOMNIA Problem is stable. Continue Ambien 10 mg at  bedtime daily as needed. Good sleep hygiene. F/U in 9 months, before if needed.  Sinus tachycardia Otherwise stable and aggravated by anxiety. Diltiazem discontinued because it was thought to be causing chronic pruritic rash and BB's side effects in the past. Mild today. Monitor HR at home.  Hypertension, essential, benign BP adequately controlled. Continue Benicar 20 mg daily and low salt diet. Monitor BP at home. Eye exam current.  Anxiety disorder, unspecified Overall stable. Continue Fluoxetine 20 mg daily.  Need for pneumococcal vaccination -     Pneumococcal polysaccharide vaccine 23-valent greater than or equal to 2yo subcutaneous/IM  Recurrent major depressive disorder, in partial remission (HCC) PHQ stable. He has not been interested in adjusting medication nor establishing with psychiatrist. Chronic medical problems (specially myasthenia gravis) and financial issues have exacerbated problems.  Denies suicidal ideation or having a plan. Continue Fluoxetine 20 mg daily. Instructed about warning signs.  He would like to continue following q 9 months. He follows regularly with ID,neurologist,dermatologist, and endocrinologist.  Return in about 39 weeks (around 07/04/2022).  Alondria Mousseau G. Martinique, MD  [facility]. [place] office.

## 2021-10-04 NOTE — Assessment & Plan Note (Addendum)
Otherwise stable and aggravated by anxiety. Diltiazem discontinued because it was thought to be causing chronic pruritic rash and BB's side effects in the past. Mild today. Monitor HR at home.

## 2021-10-04 NOTE — Assessment & Plan Note (Signed)
Overall stable. Continue Fluoxetine 20 mg daily.

## 2021-10-05 ENCOUNTER — Other Ambulatory Visit: Payer: Self-pay

## 2021-10-05 DIAGNOSIS — E119 Type 2 diabetes mellitus without complications: Secondary | ICD-10-CM

## 2021-10-05 MED ORDER — METFORMIN HCL 500 MG PO TABS: 1000.0000 mg | ORAL_TABLET | Freq: Two times a day (BID) | ORAL | 2 refills | Status: DC

## 2021-10-13 ENCOUNTER — Telehealth: Payer: Self-pay | Admitting: Family Medicine

## 2021-10-13 NOTE — Telephone Encounter (Signed)
Pt is returning sarah call concerning blood work results

## 2021-10-14 NOTE — Telephone Encounter (Signed)
See result note.  

## 2021-10-26 ENCOUNTER — Telehealth: Payer: Self-pay | Admitting: Family Medicine

## 2021-10-26 NOTE — Telephone Encounter (Signed)
Spoke with patient to schedule Medicare Annual Wellness Visit (AWV) either virtually or in office.  Patient declined stating he does not want to do   Last AWV   09/17/18 please schedule with Nurse Health Adviser   45 min for awv-i and in office appointments 30 min for awv-s  phone/virtual appointments

## 2021-11-01 DIAGNOSIS — L281 Prurigo nodularis: Secondary | ICD-10-CM | POA: Diagnosis not present

## 2021-11-01 DIAGNOSIS — L910 Hypertrophic scar: Secondary | ICD-10-CM | POA: Diagnosis not present

## 2021-11-01 DIAGNOSIS — L219 Seborrheic dermatitis, unspecified: Secondary | ICD-10-CM | POA: Diagnosis not present

## 2021-11-19 ENCOUNTER — Other Ambulatory Visit: Payer: Self-pay | Admitting: Family Medicine

## 2021-11-29 ENCOUNTER — Other Ambulatory Visit: Payer: Self-pay | Admitting: Family Medicine

## 2021-11-29 DIAGNOSIS — I10 Essential (primary) hypertension: Secondary | ICD-10-CM

## 2021-12-05 DIAGNOSIS — Z79899 Other long term (current) drug therapy: Secondary | ICD-10-CM | POA: Diagnosis not present

## 2021-12-05 DIAGNOSIS — G7000 Myasthenia gravis without (acute) exacerbation: Secondary | ICD-10-CM | POA: Diagnosis not present

## 2022-01-07 ENCOUNTER — Other Ambulatory Visit: Payer: Self-pay | Admitting: Internal Medicine

## 2022-01-07 DIAGNOSIS — E119 Type 2 diabetes mellitus without complications: Secondary | ICD-10-CM

## 2022-01-10 DIAGNOSIS — B181 Chronic viral hepatitis B without delta-agent: Secondary | ICD-10-CM | POA: Diagnosis not present

## 2022-01-25 ENCOUNTER — Other Ambulatory Visit: Payer: Self-pay | Admitting: Family Medicine

## 2022-01-25 DIAGNOSIS — F419 Anxiety disorder, unspecified: Secondary | ICD-10-CM

## 2022-01-25 DIAGNOSIS — F3341 Major depressive disorder, recurrent, in partial remission: Secondary | ICD-10-CM

## 2022-02-02 NOTE — Progress Notes (Signed)
Name: [name]  Age/ Sex: 67 y.o., male   MRN/ DOB: [id number], [birth year]     PCP: Martinique, [name], MD   Reason for Endocrinology Evaluation: Type 2 Diabetes Mellitus  Initial Endocrine Consultative Visit: 03/06/2012    PATIENT IDENTIFIER: [name] is a 67 y.o. male with a past medical history of DM, HTN , Myasthenia Gravis and Hep B chronic infection the patient has followed with Endocrinology clinic since 03/06/2012 for consultative assistance with management of his diabetes.  DIABETIC HISTORY:  Mr. Adamec was diagnosed with DM 2005. His hemoglobin A1c has ranged from 5.8% in 2022, peaking at 6.5% in 2018.     He was followed by Dr, Loanne Drilling from 2014 until 05/02/2021  SUBJECTIVE:   During the last visit (/03/2021): Saw Dr. Loanne Drilling  Today (02/03/2022): [name] is here for follow-up on diabetes management.  He checks his blood sugars 1-3 times daily. The patient has not had hypoglycemic episodes since the last clinic visit Patient follows with [facility] for myasthenia gravis Patient follows with [facility] for hepatitis B    He eats 3 meals a day   Denies nausea, vomiting or diarrhea    HOME DIABETES REGIMEN:  Januvia 100 mg daily Metformin 500 mg, 2 tabs twice daily Repaglinide 0.5 mg PRN with BG> 200 mg/dL      Statin: No ACE-I/ARB: Yes   METER DOWNLOAD SUMMARY: 12/23-02/03/2021 Fingerstick Blood Glucose Tests = 10 Overall Mean FS Glucose = 157 Standard Deviation = 29  BG Ranges: Low = 109 High = 207    Hypoglycemic Events/30 Days: BG < 50 = 0 Episodes of symptomatic severe hypoglycemia = 0    DIABETIC COMPLICATIONS: Microvascular complications:   Denies: CKD , retinopathy,neuropathy Last Eye Exam: Completed 2023  Macrovascular complications:   Denies: CAD, CVA, PVD   HISTORY:  Past Medical History:  Past Medical History:  Diagnosis Date   Anxiety    Colon polyp    Depressive disorder, not  elsewhere classified    External hemorrhoid    Insomnia, unspecified    Internal hemorrhoids    Myasthenia gravis without exacerbation ([place])    Other and unspecified hyperlipidemia    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 He has never used smokeless tobacco. He reports that he does not drink alcohol and does not use drugs. Family History: No family history on file.   HOME MEDICATIONS: Allergies as of 02/03/2022       Reactions   Azithromycin Other (See Comments)   REACTION: exac of his mg   Beta Adrenergic Blockers Other (See Comments)   REACTION: exac of mg        Medication List        Accurate as of February 03, 2022  8:28 AM. If you have any questions, ask your nurse or doctor.          ampicillin 500 MG capsule Commonly known as: PRINCIPEN Take by mouth.   calcium carbonate 600 MG Tabs tablet Commonly known as: OS-CAL Take 600 mg by mouth 3 (three) times daily with meals.  colesevelam 625 MG tablet Commonly known as: WELCHOL TAKE THREE TABLETS BY MOUTH DAILY   desonide 0.05 % gel Commonly known as: DESONATE Apply 1 application topically 2 (two) times daily as needed.   entecavir 1 MG tablet Commonly known as: BARACLUDE Take 1 tablet (1 mg total) by mouth daily.   fluocinonide ointment 0.05 % Commonly known as: LIDEX APPLY TO AFFECTED AREA(S) OF THE SKIN daily AS NEEDED. By dermatologist.   FLUoxetine 20 MG capsule Commonly known as: PROZAC TAKE ONE CAPSULE BY MOUTH DAILY   Januvia 100 MG tablet Generic drug: sitaGLIPtin TAKE 1 TABLET BY MOUTH DAILY   ketoconazole 2 % shampoo Commonly  known as: Nizoral Apply 1 application topically daily as needed for irritation.   metFORMIN 500 MG tablet Commonly known as: GLUCOPHAGE Take 2 tablets (1,000 mg total) by mouth 2 (two) times daily.   olmesartan 20 MG tablet Commonly known as: BENICAR TAKE ONE TABLET BY MOUTH DAILY *DISCONTINUE DILITIAZEM*   omeprazole 20 MG capsule Commonly known as: PRILOSEC TAKE ONE CAPSULE BY MOUTH DAILY   OneTouch Ultra test strip Generic drug: glucose blood USE TO TEST BLOOD SUGAR 3 TIMES DAILY   onetouch ultrasoft lancets USE AS INSTRUCTED TO CHECK BLOOD SUGARS THREE TIMES A DAY   pimecrolimus 1 % cream Commonly known as: ELIDEL Apply 1 application topically 2 (two) times daily as needed.   predniSONE 5 MG tablet Commonly known as: DELTASONE Take 10 mg by mouth as directed.   pyridostigmine 60 MG tablet Commonly known as: MESTINON Take 60 mg by mouth daily.   repaglinide 0.5 MG tablet Commonly known as: PRANDIN TAKE ONE TABLET BY MOUTH THREE TIMES A DAY BEFORE MEALS ONLY IF FOR BLOOD SUGAR IS OVER 200   Vitamin D 50 MCG (2000 UT) tablet Take 2,000 Units by mouth daily.   zolpidem 10 MG tablet Commonly known as: AMBIEN TAKE ONE-HALF TO ONE (0.5-1) TABLET BY MOUTH EVERY NIGHT AT BEDTIME AS NEEDED         OBJECTIVE:   Vital Signs: BP 110/72 (BP Location: Left Arm, Patient Position: Sitting, Cuff Size: Small)   Pulse 100   Ht '5\' 5"'$  (1.651 m)   Wt 122 lb (55.3 kg)   SpO2 99%   BMI 20.30 kg/m   Wt Readings from Last 3 Encounters:  02/03/22 122 lb (55.3 kg)  10/04/21 120 lb (54.4 kg)  05/02/21 124 lb (56.2 kg)     Exam: General: Pt appears well and is in NAD  Neck: General: Supple without adenopathy. Thyroid: Thyroid size normal.  No goiter or nodules appreciated.   Lungs: Clear with good BS bilat   Heart: RRR   Abdomen:  soft, nontender  Extremities: No pretibial edema.   Neuro: MS is good with appropriate affect, pt is alert and Ox3    DATA  REVIEWED:  Lab Results  Component Value Date   HGBA1C 5.9 (A) 05/02/2021   HGBA1C 5.8 (A) 10/20/2020   HGBA1C 5.8 (A) 04/14/2020   Lab Results  Component Value Date   MICROALBUR 1.1 08/23/2020   LDLCALC 68 07/23/2008   CREATININE 0.95 10/04/2021   Lab Results  Component Value Date   MICRALBCREAT 5.4 08/23/2020     Lab Results  Component Value Date   CHOL 162 03/04/2021   HDL 47.00 03/04/2021   LDLCALC 68 07/23/2008   LDLDIRECT 95.0 03/04/2021   TRIG 208.0 (H) 03/04/2021   CHOLHDL 3 03/04/2021         ASSESSMENT / PLAN /  RECOMMENDATIONS:   1) Type 2 Diabetes Mellitus, Optimally controlled, Without complications - Most recent A1c of 5.9 %. Goal A1c < 7.0 %.     -Patient on chronic prednisone therapy due to myasthenia gravis -His A1c is discordant with his glucose readings which show an average of 157 ~A1c 7.0% -He has been taking repaglinide as needed for BG readings of over 200 mg/DL, the patient checks glucose randomly throughout the day and a lot of these readings are postprandial -I did advise the patient to check glucose before each meal rather than postprandial -I have recommended against using Prandin on as-needed basis, and he will start taking it before supper as that seems to be his biggest meal of the day   MEDICATIONS: Continue metformin 500 mg 2 tabs twice daily Continue Januvia 100 mg daily Take repaglinide 0.5 mg, 1 tab before supper  EDUCATION / INSTRUCTIONS: BG monitoring instructions: Patient is instructed to check his blood sugars 1 times a day, may alternate before breakfast, lunch, and supper. Call [place] if: BG persistently < 70  I reviewed the Rule of 15 for the treatment of hypoglycemia in detail with the patient. Literature supplied.    2) Diabetic complications:  Eye: Does not have known diabetic retinopathy.  Neuro/ Feet: Does not have known diabetic peripheral neuropathy .  Renal: Patient does not have known  baseline CKD. He   is  on an ACEI/ARB at present.     F/U in 6 months   Signed electronically by: Mack Guise, MD  [facility] Endocrinology  [facility] [address] [place], [place] [address] Phone: [phone] FAX: [phone]   CC: Martinique, [name], [place] [address] Phone: [phone]  Fax: [phone]  Return to Endocrinology clinic as below: No future appointments.

## 2022-02-03 ENCOUNTER — Encounter: Payer: Self-pay | Admitting: Internal Medicine

## 2022-02-03 ENCOUNTER — Ambulatory Visit (INDEPENDENT_AMBULATORY_CARE_PROVIDER_SITE_OTHER): Payer: Medicare Other | Admitting: Internal Medicine

## 2022-02-03 VITALS — BP 110/72 | HR 100 | Ht 65.0 in | Wt 122.0 lb

## 2022-02-03 DIAGNOSIS — E119 Type 2 diabetes mellitus without complications: Secondary | ICD-10-CM

## 2022-02-03 LAB — POCT GLYCOSYLATED HEMOGLOBIN (HGB A1C): Hemoglobin A1C: 5.9 % — AB (ref 4.0–5.6)

## 2022-02-03 MED ORDER — SITAGLIPTIN PHOSPHATE 100 MG PO TABS: 100.0000 mg | ORAL_TABLET | Freq: Every day | ORAL | 3 refills | Status: DC

## 2022-02-03 MED ORDER — REPAGLINIDE 0.5 MG PO TABS: 0.5000 mg | ORAL_TABLET | Freq: Every day | ORAL | 3 refills | Status: DC

## 2022-02-03 MED ORDER — METFORMIN HCL 500 MG PO TABS: 1000.0000 mg | ORAL_TABLET | Freq: Two times a day (BID) | ORAL | 3 refills | Status: DC

## 2022-02-03 NOTE — Patient Instructions (Addendum)
Continue Januvia 100 mg daily Continue Metformin 500 mg, 2 tabs twice daily Take Repaglinide 0.5 mg , 1 tablet before dinner     HOW TO TREAT LOW BLOOD SUGARS (Blood sugar LESS THAN 70 MG/DL) Please follow the RULE OF 15 for the treatment of hypoglycemia treatment (when your (blood sugars are less than 70 mg/dL)   STEP 1: Take 15 grams of carbohydrates when your blood sugar is low, which includes:  3-4 GLUCOSE TABS  OR 3-4 OZ OF JUICE OR REGULAR SODA OR ONE TUBE OF GLUCOSE GEL    STEP 2: RECHECK blood sugar in 15 MINUTES STEP 3: If your blood sugar is still low at the 15 minute recheck --> then, go back to STEP 1 and treat AGAIN with another 15 grams of carbohydrates.

## 2022-05-23 ENCOUNTER — Other Ambulatory Visit: Payer: Self-pay | Admitting: Family Medicine

## 2022-05-23 DIAGNOSIS — G4700 Insomnia, unspecified: Secondary | ICD-10-CM

## 2022-05-24 ENCOUNTER — Telehealth: Payer: Self-pay

## 2022-05-24 MED ORDER — COLESEVELAM HCL 625 MG PO TABS: 1875.0000 mg | ORAL_TABLET | Freq: Every day | ORAL | 0 refills | Status: DC

## 2022-05-24 NOTE — Telephone Encounter (Signed)
Done

## 2022-05-24 NOTE — Telephone Encounter (Signed)
Inbound fax from pharmacy requesting refill for Colesevelam 625 mg tablet.

## 2022-05-28 ENCOUNTER — Other Ambulatory Visit: Payer: Self-pay

## 2022-05-28 DIAGNOSIS — I10 Essential (primary) hypertension: Secondary | ICD-10-CM

## 2022-05-28 MED ORDER — OLMESARTAN MEDOXOMIL 20 MG PO TABS: ORAL_TABLET | ORAL | 1 refills | Status: DC

## 2022-06-08 DIAGNOSIS — Z7952 Long term (current) use of systemic steroids: Secondary | ICD-10-CM | POA: Diagnosis not present

## 2022-06-08 DIAGNOSIS — F419 Anxiety disorder, unspecified: Secondary | ICD-10-CM | POA: Diagnosis not present

## 2022-06-08 DIAGNOSIS — Z923 Personal history of irradiation: Secondary | ICD-10-CM | POA: Diagnosis not present

## 2022-06-08 DIAGNOSIS — G7000 Myasthenia gravis without (acute) exacerbation: Secondary | ICD-10-CM | POA: Diagnosis not present

## 2022-06-08 DIAGNOSIS — R5383 Other fatigue: Secondary | ICD-10-CM | POA: Diagnosis not present

## 2022-06-08 DIAGNOSIS — E119 Type 2 diabetes mellitus without complications: Secondary | ICD-10-CM | POA: Diagnosis not present

## 2022-06-08 DIAGNOSIS — Z85238 Personal history of other malignant neoplasm of thymus: Secondary | ICD-10-CM | POA: Diagnosis not present

## 2022-06-08 DIAGNOSIS — R918 Other nonspecific abnormal finding of lung field: Secondary | ICD-10-CM | POA: Diagnosis not present

## 2022-06-08 DIAGNOSIS — Z7984 Long term (current) use of oral hypoglycemic drugs: Secondary | ICD-10-CM | POA: Diagnosis not present

## 2022-06-08 DIAGNOSIS — Z79899 Other long term (current) drug therapy: Secondary | ICD-10-CM | POA: Diagnosis not present

## 2022-06-08 DIAGNOSIS — B370 Candidal stomatitis: Secondary | ICD-10-CM | POA: Diagnosis not present

## 2022-06-16 ENCOUNTER — Emergency Department (HOSPITAL_COMMUNITY): Payer: Medicare Other

## 2022-06-16 ENCOUNTER — Inpatient Hospital Stay (HOSPITAL_COMMUNITY)
Admission: EM | Admit: 2022-06-16 | Discharge: 2022-06-19 | DRG: 057 | Disposition: A | Discharge status: Home / Self Care | Payer: Medicare Other | Attending: Family Medicine | Admitting: Family Medicine

## 2022-06-16 ENCOUNTER — Other Ambulatory Visit: Payer: Self-pay

## 2022-06-16 ENCOUNTER — Encounter (HOSPITAL_COMMUNITY): Payer: Self-pay | Admitting: Internal Medicine

## 2022-06-16 DIAGNOSIS — E1169 Type 2 diabetes mellitus with other specified complication: Secondary | ICD-10-CM | POA: Diagnosis present

## 2022-06-16 DIAGNOSIS — Z881 Allergy status to other antibiotic agents status: Secondary | ICD-10-CM | POA: Diagnosis not present

## 2022-06-16 DIAGNOSIS — Z7984 Long term (current) use of oral hypoglycemic drugs: Secondary | ICD-10-CM | POA: Diagnosis not present

## 2022-06-16 DIAGNOSIS — Z85238 Personal history of other malignant neoplasm of thymus: Secondary | ICD-10-CM

## 2022-06-16 DIAGNOSIS — Z79899 Other long term (current) drug therapy: Secondary | ICD-10-CM | POA: Diagnosis not present

## 2022-06-16 DIAGNOSIS — Z923 Personal history of irradiation: Secondary | ICD-10-CM

## 2022-06-16 DIAGNOSIS — G7000 Myasthenia gravis without (acute) exacerbation: Secondary | ICD-10-CM | POA: Diagnosis not present

## 2022-06-16 DIAGNOSIS — I10 Essential (primary) hypertension: Secondary | ICD-10-CM | POA: Diagnosis not present

## 2022-06-16 DIAGNOSIS — R1310 Dysphagia, unspecified: Secondary | ICD-10-CM | POA: Diagnosis present

## 2022-06-16 DIAGNOSIS — G7001 Myasthenia gravis with (acute) exacerbation: Principal | ICD-10-CM | POA: Diagnosis present

## 2022-06-16 DIAGNOSIS — E785 Hyperlipidemia, unspecified: Secondary | ICD-10-CM | POA: Diagnosis present

## 2022-06-16 DIAGNOSIS — E871 Hypo-osmolality and hyponatremia: Secondary | ICD-10-CM | POA: Insufficient documentation

## 2022-06-16 DIAGNOSIS — R29810 Facial weakness: Secondary | ICD-10-CM | POA: Diagnosis present

## 2022-06-16 DIAGNOSIS — F4310 Post-traumatic stress disorder, unspecified: Secondary | ICD-10-CM | POA: Diagnosis present

## 2022-06-16 DIAGNOSIS — B1910 Unspecified viral hepatitis B without hepatic coma: Secondary | ICD-10-CM | POA: Diagnosis present

## 2022-06-16 DIAGNOSIS — Z7952 Long term (current) use of systemic steroids: Secondary | ICD-10-CM

## 2022-06-16 DIAGNOSIS — F419 Anxiety disorder, unspecified: Secondary | ICD-10-CM | POA: Diagnosis present

## 2022-06-16 DIAGNOSIS — R0602 Shortness of breath: Secondary | ICD-10-CM | POA: Diagnosis not present

## 2022-06-16 LAB — COMPREHENSIVE METABOLIC PANEL
ALT: 15 U/L (ref 0–44)
AST: 21 U/L (ref 15–41)
Albumin: 4.1 g/dL (ref 3.5–5.0)
Alkaline Phosphatase: 35 U/L — ABNORMAL LOW (ref 38–126)
Anion gap: 11 (ref 5–15)
BUN: 7 mg/dL — ABNORMAL LOW (ref 8–23)
CO2: 27 mmol/L (ref 22–32)
Calcium: 9.1 mg/dL (ref 8.9–10.3)
Chloride: 92 mmol/L — ABNORMAL LOW (ref 98–111)
Creatinine, Ser: 1.02 mg/dL (ref 0.61–1.24)
GFR, Estimated: >60 mL/min (ref >60)
Glucose, Bld: 104 mg/dL — ABNORMAL HIGH (ref 70–99)
Potassium: 3.5 mmol/L (ref 3.5–5.1)
Sodium: 130 mmol/L — ABNORMAL LOW (ref 135–145)
Total Bilirubin: 0.6 mg/dL (ref 0.3–1.2)
Total Protein: 7 g/dL (ref 6.5–8.1)

## 2022-06-16 LAB — HEMOGLOBIN A1C
Hgb A1c MFr Bld: 5.7 % — ABNORMAL HIGH (ref 4.8–5.6)
Mean Plasma Glucose: 116.89 mg/dL

## 2022-06-16 LAB — GLUCOSE, CAPILLARY
Glucose-Capillary: 183 mg/dL — ABNORMAL HIGH (ref 70–99)
Glucose-Capillary: 84 mg/dL (ref 70–99)

## 2022-06-16 LAB — CBC WITH DIFFERENTIAL/PLATELET
Abs Immature Granulocytes: 0.03 10*3/uL (ref 0.00–0.07)
Basophils Absolute: 0.1 10*3/uL (ref 0.0–0.1)
Basophils Relative: 1 %
Eosinophils Absolute: 0.2 10*3/uL (ref 0.0–0.5)
Eosinophils Relative: 3 %
HCT: 41.9 % (ref 39.0–52.0)
Hemoglobin: 14.3 g/dL (ref 13.0–17.0)
Immature Granulocytes: 0 %
Lymphocytes Relative: 37 %
Lymphs Abs: 3.3 10*3/uL (ref 0.7–4.0)
MCH: 28.6 pg (ref 26.0–34.0)
MCHC: 34.1 g/dL (ref 30.0–36.0)
MCV: 83.8 fL (ref 80.0–100.0)
Monocytes Absolute: 0.7 10*3/uL (ref 0.1–1.0)
Monocytes Relative: 8 %
Neutro Abs: 4.6 10*3/uL (ref 1.7–7.7)
Neutrophils Relative %: 51 %
Platelets: 288 10*3/uL (ref 150–400)
RBC: 5 MIL/uL (ref 4.22–5.81)
RDW: 13.1 % (ref 11.5–15.5)
WBC: 9 10*3/uL (ref 4.0–10.5)
nRBC: 0 % (ref 0.0–0.2)

## 2022-06-16 LAB — HIV ANTIBODY (ROUTINE TESTING W REFLEX): HIV Screen 4th Generation wRfx: NONREACTIVE

## 2022-06-16 MED ORDER — REPAGLINIDE 1 MG PO TABS: 0.5000 mg | ORAL_TABLET | Freq: Every day | ORAL | Status: DC | PRN

## 2022-06-16 MED ORDER — METFORMIN HCL 500 MG PO TABS
1000.0000 mg | ORAL_TABLET | Freq: Two times a day (BID) | ORAL | Status: DC
  Administered 2022-06-16 – 2022-06-19 (×5): 1000 mg via ORAL
  Filled 2022-06-16 (×5): qty 2

## 2022-06-16 MED ORDER — PREDNISONE 5 MG PO TABS
10.0000 mg | ORAL_TABLET | Freq: Every day | ORAL | Status: DC
  Administered 2022-06-17 – 2022-06-19 (×3): 10 mg via ORAL
  Filled 2022-06-16 (×3): qty 2

## 2022-06-16 MED ORDER — FLUOXETINE HCL 20 MG PO CAPS
20.0000 mg | ORAL_CAPSULE | Freq: Every day | ORAL | Status: DC
  Filled 2022-06-16 (×3): qty 1

## 2022-06-16 MED ORDER — ZOLPIDEM TARTRATE 5 MG PO TABS: 5.0000 mg | ORAL_TABLET | Freq: Every evening | ORAL | Status: DC | PRN

## 2022-06-16 MED ORDER — LINAGLIPTIN 5 MG PO TABS
5.0000 mg | ORAL_TABLET | Freq: Every day | ORAL | Status: DC
  Administered 2022-06-16 – 2022-06-19 (×4): 5 mg via ORAL
  Filled 2022-06-16 (×4): qty 1

## 2022-06-16 MED ORDER — ALPRAZOLAM 0.5 MG PO TABS
0.5000 mg | ORAL_TABLET | Freq: Three times a day (TID) | ORAL | Status: DC | PRN
  Administered 2022-06-17 – 2022-06-18 (×2): 0.5 mg via ORAL
  Filled 2022-06-16 (×2): qty 1

## 2022-06-16 MED ORDER — INSULIN ASPART 100 UNIT/ML IJ SOLN: 0.0000 [IU] | Freq: Three times a day (TID) | INTRAMUSCULAR | Status: DC

## 2022-06-16 MED ORDER — LORAZEPAM 2 MG/ML IJ SOLN
0.5000 mg | Freq: Once | INTRAMUSCULAR | Status: DC
  Filled 2022-06-16: qty 1

## 2022-06-16 MED ORDER — ACETAMINOPHEN 325 MG PO TABS
650.0000 mg | ORAL_TABLET | Freq: Four times a day (QID) | ORAL | Status: DC | PRN
  Administered 2022-06-17: 650 mg via ORAL
  Filled 2022-06-16: qty 2

## 2022-06-16 MED ORDER — IRBESARTAN 150 MG PO TABS
150.0000 mg | ORAL_TABLET | Freq: Every day | ORAL | Status: DC
  Administered 2022-06-16 – 2022-06-19 (×4): 150 mg via ORAL
  Filled 2022-06-16 (×4): qty 1

## 2022-06-16 MED ORDER — SODIUM CHLORIDE 0.45 % IV SOLN: INTRAVENOUS | Status: DC

## 2022-06-16 MED ORDER — PYRIDOSTIGMINE BROMIDE 60 MG PO TABS: 30.0000 mg | ORAL_TABLET | Freq: Every day | ORAL | Status: DC

## 2022-06-16 MED ORDER — ENTECAVIR 1 MG PO TABS
1.0000 mg | ORAL_TABLET | Freq: Every day | ORAL | Status: DC
  Administered 2022-06-17 – 2022-06-18 (×2): 1 mg via ORAL
  Filled 2022-06-16 (×4): qty 1

## 2022-06-16 MED ORDER — ACETAMINOPHEN 650 MG RE SUPP: 650.0000 mg | Freq: Four times a day (QID) | RECTAL | Status: DC | PRN

## 2022-06-16 MED ORDER — IMMUNE GLOBULIN (HUMAN) 10 GM/100ML IV SOLN
800.0000 mg/kg | INTRAVENOUS | Status: AC
  Administered 2022-06-17 – 2022-06-18 (×2): 40 g via INTRAVENOUS
  Filled 2022-06-16 (×2): qty 400

## 2022-06-16 MED ORDER — PANTOPRAZOLE SODIUM 40 MG PO TBEC
40.0000 mg | DELAYED_RELEASE_TABLET | Freq: Every day | ORAL | Status: DC
  Administered 2022-06-16 – 2022-06-19 (×4): 40 mg via ORAL
  Filled 2022-06-16 (×4): qty 1

## 2022-06-16 MED ORDER — IMMUNE GLOBULIN (HUMAN) 10 GM/100ML IV SOLN
400.0000 mg/kg | Freq: Once | INTRAVENOUS | Status: AC
  Administered 2022-06-16: 20 g via INTRAVENOUS
  Filled 2022-06-16: qty 200

## 2022-06-16 MED ORDER — ENOXAPARIN SODIUM 40 MG/0.4ML IJ SOSY: 40.0000 mg | PREFILLED_SYRINGE | INTRAMUSCULAR | Status: DC

## 2022-06-16 MED ORDER — COLESEVELAM HCL 625 MG PO TABS
1875.0000 mg | ORAL_TABLET | Freq: Every day | ORAL | Status: DC
  Filled 2022-06-16 (×4): qty 3

## 2022-06-16 MED ORDER — PYRIDOSTIGMINE BROMIDE 60 MG PO TABS
60.0000 mg | ORAL_TABLET | Freq: Three times a day (TID) | ORAL | Status: DC
  Administered 2022-06-16 – 2022-06-19 (×9): 60 mg via ORAL
  Filled 2022-06-16 (×11): qty 1

## 2022-06-16 NOTE — ED Notes (Signed)
ED TO INPATIENT HANDOFF REPORT  ED Nurse Name and Phone #:  Lindie Spruce [phone]  S Name/Age/Gender [name] 67 y.o. male Room/Bed: 023C/023C  Code Status   Code Status: Full Code  Home/SNF/Other Home Patient oriented to: self, place, time, and situation Is this baseline? Yes   Triage Complete: Triage complete  Chief Complaint MG (myasthenia gravis) (HCC) [G70.00]  Triage Note Patient reports worsening weakness at right side with right eyelid drooping for 4 weeks , history of myasthenia gravis . Respirations unlabored , denies pain , alert and oriented .    Allergies Allergies  Allergen Reactions   Azithromycin Other (See Comments)    REACTION: exac of his mg   Beta Adrenergic Blockers Other (See Comments)    REACTION: exac of mg    Level of Care/Admitting Diagnosis ED Disposition     ED Disposition  Admit   Condition  --   Comment  Hospital Area: [facility] [[id number]]  Level of Care: Med-Surg [16]  May place patient in observation at [facility] or [facility] if equivalent level of care is available:: No  Covid Evaluation: Asymptomatic - no recent exposure (last 10 days) testing not required  Diagnosis: MG (myasthenia gravis) (HCC) [292009]  Admitting Physician: Jacques Navy [[id number]]  Attending Physician: Jacques Navy [[id number]]          B Medical/Surgery History 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)    Type II or unspecified type diabetes mellitus without mention of complication, not stated as uncontrolled    Viral hepatitis B without mention of hepatic coma, chronic, without mention of hepatitis delta    Past Surgical History:  Procedure  Laterality Date   THYMECTOMY       A IV Location/Drains/Wounds Patient Lines/Drains/Airways Status     Active Line/Drains/Airways     Name Placement date Placement time Site Days   Peripheral IV 06/16/22 20 G Right Antecubital 06/16/22  0616  Antecubital  less than 1            Intake/Output Last 24 hours No intake or output data in the 24 hours ending 06/16/22 1519  Labs/Imaging Results for orders placed or performed during the hospital encounter of 06/16/22 (from the past 48 hour(s))  CBC with Differential     Status: None   Collection Time: 06/16/22  7:22 AM  Result Value Ref Range   WBC 9.0 4.0 - 10.5 K/uL   RBC 5.00 4.22 - 5.81 MIL/uL   Hemoglobin 14.3 13.0 - 17.0 g/dL   HCT 84.1 32.4 - 40.1 %   MCV 83.8 80.0 - 100.0 fL   MCH 28.6 26.0 - 34.0 pg   MCHC 34.1 30.0 - 36.0 g/dL   RDW 02.7 25.3 - 66.4 %   Platelets 288 150 - 400 K/uL   nRBC 0.0 0.0 - 0.2 %   Neutrophils Relative % 51 %   Neutro Abs 4.6 1.7 - 7.7 K/uL   Lymphocytes Relative 37 %   Lymphs Abs 3.3 0.7 - 4.0 K/uL   Monocytes Relative 8 %   Monocytes Absolute 0.7 0.1 - 1.0 K/uL   Eosinophils Relative 3 %  Eosinophils Absolute 0.2 0.0 - 0.5 K/uL   Basophils Relative 1 %   Basophils Absolute 0.1 0.0 - 0.1 K/uL   Immature Granulocytes 0 %   Abs Immature Granulocytes 0.03 0.00 - 0.07 K/uL    Comment: Performed at [facility], [address] [place], [place] [address]  Comprehensive metabolic panel     Status: Abnormal   Collection Time: 06/16/22  7:22 AM  Result Value Ref Range   Sodium 130 (L) 135 - 145 mmol/L   Potassium 3.5 3.5 - 5.1 mmol/L   Chloride 92 (L) 98 - 111 mmol/L   CO2 27 22 - 32 mmol/L   Glucose, Bld 104 (H) 70 - 99 mg/dL    Comment: Glucose reference range applies only to samples taken after fasting for at least 8 hours.   BUN 7 (L) 8 - 23 mg/dL   Creatinine, Ser 6.04 0.61 - 1.24 mg/dL   Calcium 9.1 8.9 - 54.0 mg/dL   Total Protein 7.0 6.5 - 8.1 g/dL   Albumin 4.1 3.5 -  5.0 g/dL   AST 21 15 - 41 U/L   ALT 15 0 - 44 U/L   Alkaline Phosphatase 35 (L) 38 - 126 U/L   Total Bilirubin 0.6 0.3 - 1.2 mg/dL   GFR, Estimated >98 >11 mL/min    Comment: (NOTE) Calculated using the CKD-EPI Creatinine Equation (2021)    Anion gap 11 5 - 15    Comment: Performed at [facility], [address] [place], [place] [address]   DG Chest 2 View  Result Date: 06/16/2022 CLINICAL DATA:  shortness of breath EXAM: CHEST - 2 VIEW COMPARISON:  Chest XR, 12/02/2007.  CT chest, 223 2012. FINDINGS: Cardiomediastinal silhouette is within normal limits. Surgical clips at the superior mediastinum. Lungs are well inflated. No focal consolidation or mass. No pleural effusion or pneumothorax. Median sternotomy. No acute displaced fracture. IMPRESSION: Normal chest. Electronically Signed   By: Roanna Banning M.D.   On: 06/16/2022 08:25    Pending Labs Unresulted Labs (From admission, onward)     Start     Ordered   06/23/22 0500  Creatinine, serum  (enoxaparin (LOVENOX)    CrCl >/= 30 ml/min)  Weekly,   R     Comments: while on enoxaparin therapy    06/16/22 1440   06/17/22 0500  Basic metabolic panel  Tomorrow morning,   R        06/16/22 1440   06/16/22 1508  Hemoglobin A1c  Add-on,   AD        06/16/22 1509   06/16/22 1439  HIV Antibody (routine testing w rflx)  (HIV Antibody (Routine testing w reflex) panel)  Add-on,   AD        06/16/22 1440            Vitals/Pain Today's Vitals   06/16/22 1330 06/16/22 1400 06/16/22 1417 06/16/22 1430  BP: 124/89 (!) 132/92  (!) 141/101  Pulse: 100 (!) 102  (!) 104  Resp: (!) 26 (!) 21  (!) 26  Temp:   98.7 F (37.1 C)   TempSrc:   Oral   SpO2: 99% 100%  100%  Weight:      PainSc:        Isolation Precautions No active isolations  Medications Medications  LORazepam (ATIVAN) injection 0.5 mg (2 mg Intravenous Not Given 06/16/22 0742)  Immune Globulin 10% (PRIVIGEN) IV infusion 20 g (has no administration in time range)  Immune Globulin 10% (PRIVIGEN) IV infusion 800 mg/kg (has no administration in time range)  entecavir (BARACLUDE) tablet 1 mg (has no administration in time range)  colesevelam [facility]) tablet 1,875 mg (has no administration in time range)  irbesartan (AVAPRO) tablet 150 mg (has no administration in time range)  FLUoxetine (PROZAC) capsule 20 mg (has no administration in time range)  zolpidem (AMBIEN) tablet 5 mg (has no administration in time range)  metFORMIN (GLUCOPHAGE) tablet 1,000 mg (has no administration in time range)  predniSONE (DELTASONE) tablet 10 mg (has no administration in time range)  repaglinide (PRANDIN) tablet 0.5 mg (has no administration in time range)  linagliptin (TRADJENTA) tablet 5 mg (has no administration in time range)  pantoprazole (PROTONIX) EC tablet 40 mg (has no administration in time range)  enoxaparin (LOVENOX) injection 40 mg (has no administration in time range)  0.45 % sodium chloride infusion ( Intravenous New Bag/Given 06/16/22 1503)  acetaminophen (TYLENOL) tablet 650 mg (has no administration in time range)    Or  acetaminophen (TYLENOL) suppository 650 mg (has no administration in time range)  pyridostigmine (MESTINON) tablet 60 mg (has no administration in time range)  insulin aspart (novoLOG) injection 0-20 Units (has no administration in time range)    Mobility walks     Focused Assessments Neuro Assessment Handoff:  Swallow screen pass? Yes          Neuro Assessment: Exceptions to WDL (right sided facial droop) Neuro Checks:      Has TPA been given? No If patient is a Neuro Trauma and patient is going to OR before floor call report to 4N Charge nurse: [phone] or [phone]   R Recommendations: See Admitting Provider Note  Report given to:   Additional Notes:

## 2022-06-16 NOTE — H&P (Signed)
History and Physical    [name] AOZ:[id number] DOB: [birth year] DOA: 06/16/2022  DOS: the patient was seen and examined on 06/16/2022  PCP: [place], [name], MD   Patient coming from: Home  I have personally briefly reviewed patient's old medical records in [facility] Link  [name], a 68 y/o with history of myasthenia gravis, hepatitis B, T2DM, hypertension presents today with complaints of myasthenia gravis flare. He states that over the past 4 weeks he has been having worsening ptosis in his right eye as well as right face and arm weakness. He also reports difficulty swallowing and difficulty breathing.   Record reviewed: onset MG 2003 s/p thymectomy for malignant thymoma. Treated with progressive doses of prednisone thru 2016. He had a plasma exchange in 2017 with good results. In 2018 with recurrent symptoms prednisone was gradually increased to 30 mg daily by 2019. In 2019 he had infusion of IGIG which helped ameliorate his symptoms of swallow difficulty and SOB. He saw his neurologist at [facility] for same on 06/08/22 and they suspected that the patients PTSD and anxiety was a component of his recurrent symptoms. Watching the news increases his anxiety and increases his symptoms. Neurology recommended increasing his prednisone from 5 mg to 10 mg. He states that when he takes the prednisone he feels better and his symptoms improve, however in about 2-3 hours they return. In addition, he was started on Mestinon and progressed to 15 mg qid.   Due to his increased symptoms and breathing concerns he presents to [facility] for evaluation.    ED Course: Afebrile  124/89  HR 100 RR 26. Non-focal exam per ED-PA. Lab: Cmet nl, CBCD nl, CXR-NAD, EKG w/o acute changes, MRI brain pending. ED-PA consulted Dr, Amada Jupiter who recommended TRH admit, IVIG infusion.   Review of Systems:  Review of Systems  Constitutional:  Negative for chills, fever, malaise/fatigue and weight loss.  HENT: Negative.    Eyes:  Negative.   Respiratory:  Positive for shortness of breath. Negative for cough, sputum production and wheezing.        SOB with minimal exertion.   Cardiovascular: Negative.   Gastrointestinal: Negative.   Musculoskeletal:        Right eye ptosis. Generalized weakness, worst with chewing, swallowing.   Skin: Negative.   Neurological:  Positive for weakness. Negative for tremors, sensory change, seizures and loss of consciousness.  Endo/Heme/Allergies: Negative.   Psychiatric/Behavioral: Negative.      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 He has a son and a daughter. He survived the "[place]illing [place]" of [place] under [employer]. He worked as a Retail buyer. He lives with his wife.    reports that he has never smoked. He has never used smokeless tobacco. He reports that he does not drink alcohol and does not use drugs.  Allergies  Allergen Reactions   Azithromycin Other (See Comments)    REACTION: exac of his mg   Beta Adrenergic Blockers Other (See Comments)    REACTION: exac of mg    History reviewed. No pertinent family history.  Prior to Admission medications   Medication Sig Start Date End Date Taking? Authorizing Provider  ampicillin (PRINCIPEN) 500 MG capsule Take by mouth. 08/07/20   [provider]  calcium carbonate (OS-CAL) 600 MG TABS Take 600 mg by mouth 3 (three)  times daily with meals.    [provider]  Cholecalciferol (VITAMIN D) 2000 UNITS tablet Take 2,000 Units by mouth daily.    [provider]  colesevelam (WELCHOL) 625 MG tablet Take 3 tablets (1,875 mg total) by mouth daily. 05/24/22   Shamleffer, Konrad Dolores, MD  desonide (DESONATE) 0.05 % gel Apply 1 application topically 2 (two) times daily as needed.     [provider]  entecavir (BARACLUDE) 1 MG tablet Take 1 tablet (1 mg total) by mouth daily. 07/13/11   Brooke Dare, MD  fluocinonide ointment (LIDEX) 0.05 % APPLY TO AFFECTED AREA(S) OF THE SKIN daily AS NEEDED. By dermatologist. 10/04/21   [place], [name], MD  FLUoxetine (PROZAC) 20 MG capsule TAKE ONE CAPSULE BY MOUTH DAILY 01/25/22   [place], [name], MD  glucose blood (ONETOUCH ULTRA) test strip USE TO TEST BLOOD SUGAR 3 TIMES DAILY 12/10/20   Romero Belling, MD  ketoconazole (NIZORAL) 2 % shampoo Apply 1 application topically daily as needed for irritation. 01/07/21   [place], [name], MD  Lancets [facility] ULTRASOFT) lancets USE AS INSTRUCTED TO CHECK BLOOD SUGARS THREE TIMES A DAY 01/13/19   Romero Belling, MD  metFORMIN (GLUCOPHAGE) 500 MG tablet Take 2 tablets (1,000 mg total) by mouth 2 (two) times daily. 02/03/22   Shamleffer, Konrad Dolores, MD  olmesartan (BENICAR) 20 MG tablet TAKE ONE TABLET BY MOUTH DAILY *DISCONTINUE DILITIAZEM* 05/28/22   Shamleffer, Konrad Dolores, MD  omeprazole (PRILOSEC) 20 MG capsule TAKE 1 CAPSULE BY MOUTH DAILY 05/29/22   [place], [name], MD  pimecrolimus (ELIDEL) 1 % cream Apply 1 application topically 2 (two) times daily as needed.  01/01/13   [provider]  predniSONE (DELTASONE) 5 MG tablet Take 10 mg by mouth as directed.    [provider]  pyridostigmine (MESTINON) 60 MG tablet Take 60 mg by mouth daily. Patient not taking: Reported on 02/03/2022    [provider]  repaglinide (PRANDIN) 0.5 MG tablet Take 1 tablet (0.5 mg total) by mouth daily  before supper. 02/03/22   Shamleffer, Konrad Dolores, MD  sitaGLIPtin (JANUVIA) 100 MG tablet Take 1 tablet (100 mg total) by mouth daily. 02/03/22   Shamleffer, Konrad Dolores, MD  zolpidem (AMBIEN) 10 MG tablet TAKE 1/2 TO 1 TABLET (0.5-1) BY MOUTH EVERY NIGHT AT BEDTIME AS NEEDED 05/29/22   [place], [name], MD    Physical Exam: Vitals:   06/16/22 1330 06/16/22 1400 06/16/22 1417 06/16/22 1430  BP: 124/89 (!) 132/92  (!) 141/101  Pulse: 100 (!) 102  (!) 104  Resp: (!) 26 (!) 21  (!) 26  Temp:   98.7 F (37.1 C)   TempSrc:   Oral   SpO2: 99% 100%  100%  Weight:        Physical Exam Vitals and nursing note reviewed.  Constitutional:      General: He is not in acute distress.    Appearance: Normal appearance. He is normal weight.  HENT:     Head: Normocephalic and atraumatic.     Mouth/Throat:     Mouth: Mucous membranes are moist.     Pharynx: Oropharynx is clear. No oropharyngeal exudate.  Eyes:     Extraocular Movements: Extraocular movements intact.     Conjunctiva/sclera: Conjunctivae normal.     Pupils: Pupils are equal, round, and reactive to light.  Cardiovascular:     Rate and Rhythm: Regular rhythm. Tachycardia present.     Pulses: Normal pulses.     Heart sounds: Normal heart sounds.  Pulmonary:     Effort: Pulmonary effort is normal.     Breath sounds: Normal breath sounds. No wheezing, rhonchi or rales.  Abdominal:     General: Abdomen is flat. Bowel sounds are normal.     Palpations: Abdomen is soft. There is no mass.     Tenderness: There is no abdominal tenderness.  Musculoskeletal:        General: No deformity. Normal range of motion.     Cervical back: No rigidity.     Right lower leg: No edema.     Left lower leg: No edema.  Lymphadenopathy:     Cervical: No cervical adenopathy.  Skin:    General: Skin is warm and dry.  Neurological:     Mental Status: He is alert and oriented to person, place, and time.     Cranial Nerves: No cranial nerve  deficit.     Motor: Weakness present.     Comments: Awake, alert, oriented x 3. CN - right eye ptosis, PERRLA, EOMI, brow wrinkles to command, minimal right facial droop with forced smile, nl shoulder shrug. Gag not tested MS - 5/5 grip strength but left stronger than right in right handed male, Proximal UE 5/5, proximal LE 5/5 and could hold against resistance. DTRs - 2+ and symmetrical Cerebellar - no tremor  Psychiatric:        Mood and Affect: Mood normal.        Behavior: Behavior normal.        Thought Content: Thought content normal.      Labs on Admission: I have personally reviewed following labs and imaging studies  CBC: Recent Labs  Lab 06/16/22 0722  WBC 9.0  NEUTROABS 4.6  HGB 14.3  HCT 41.9  MCV 83.8  PLT 288   Basic Metabolic Panel: Recent Labs  Lab 06/16/22 0722  NA 130*  K 3.5  CL 92*  CO2 27  GLUCOSE 104*  BUN 7*  CREATININE 1.02  CALCIUM 9.1   GFR: Estimated Creatinine Clearance: 54.2 mL/min (by C-G formula based on SCr of 1.02 mg/dL). Liver Function Tests: Recent Labs  Lab 06/16/22 0722  AST 21  ALT 15  ALKPHOS 35*  BILITOT 0.6  PROT 7.0  ALBUMIN 4.1   No results for input(s): "LIPASE", "AMYLASE" in the last 168 hours. No results for input(s): "AMMONIA" in the last 168 hours. Coagulation Profile: No results for input(s): "INR", "PROTIME" in the last 168 hours. Cardiac Enzymes: No results for input(s): "CKTOTAL", "CKMB", "CKMBINDEX", "TROPONINI" in the last 168 hours. BNP (last 3 results) No results for input(s): "PROBNP" in the last 8760 hours. HbA1C: No results for input(s): "HGBA1C" in the last 72 hours. CBG: No results for input(s): "GLUCAP" in the last 168 hours. Lipid Profile: No results for input(s): "CHOL", "HDL", "LDLCALC", "TRIG", "CHOLHDL", "LDLDIRECT" in the last 72 hours. Thyroid Function Tests: No results for input(s): "TSH", "T4TOTAL", "FREET4", "T3FREE", "THYROIDAB" in the last 72  hours. Anemia Panel: No results  for input(s): "VITAMINB12", "FOLATE", "FERRITIN", "TIBC", "IRON", "RETICCTPCT" in the last 72 hours. Urine analysis:    Component Value Date/Time   COLORURINE YELLOW 12/25/2016 1056   APPEARANCEUR CLEAR 12/25/2016 1056   LABSPEC 1.010 12/25/2016 1056   PHURINE 8.0 12/25/2016 1056   GLUCOSEU NEGATIVE 12/25/2016 1056   HGBUR NEGATIVE 12/25/2016 1056   BILIRUBINUR NEGATIVE 12/25/2016 1056   KETONESUR NEGATIVE 12/25/2016 1056   PROTEINUR NEGATIVE 01/27/2007 1715   UROBILINOGEN 0.2 12/25/2016 1056   NITRITE NEGATIVE 12/25/2016 1056   LEUKOCYTESUR NEGATIVE 12/25/2016 1056    Radiological Exams on Admission: I have personally reviewed images DG Chest 2 View  Result Date: 06/16/2022 CLINICAL DATA:  shortness of breath EXAM: CHEST - 2 VIEW COMPARISON:  Chest XR, 12/02/2007.  CT chest, 223 2012. FINDINGS: Cardiomediastinal silhouette is within normal limits. Surgical clips at the superior mediastinum. Lungs are well inflated. No focal consolidation or mass. No pleural effusion or pneumothorax. Median sternotomy. No acute displaced fracture. IMPRESSION: Normal chest. Electronically Signed   By: Roanna Banning M.D.   On: 06/16/2022 08:25    EKG: I have personally reviewed EKG: Sinus rhythm, no acute changes  Assessment/Plan Active Problems:   Myasthenia gravis with acute exacerbation (HCC)   Type 2 diabetes mellitus with other specified complication (HCC)   Anxiety disorder, unspecified    Assessment and Plan: * MG (myasthenia gravis) (HCC)-resolved as of 06/16/2022 Established diagnosis. Records reviewed. Last [facility] neurology visit Jun 08, 2022 when prednisone changed to 10 mg daily and mestinon increased to 60 mg TID. Now with exacerbation of symptoms: OD ptosis, chewing difficulty, swallow difficulty, DOE. He has no record of cardiac evaluation as possible contributing factor to DOE.  Plan  Continue home regimen: prednisone 10 mg daily, mestinon 60 mg TID  IVIG infusion - as recommended by  neurology  2D echo - r/o any cardiac contribution to DOE  Myasthenia gravis with acute exacerbation (HCC) Long standing problem as reviewed. Last [facility] visit at [facility] Jun 08, 2022: prednisone dose 10 mg daily, mestinon does increased to 60 mg TID. Now with worsening symptoms: OD ptosis, weakness in chewing, difficulty with swallow and increased DOE. Question if PTSD is a contributing factor to his symptomatology. He has not had cardiac evaluation in regard to DOE.  Plan Med-surg obs admit  Continue home regimen: prednisone 10 mg daily, mestinon 60 mg TID  IVIG as recommended by neurology  2D echo to r/o cardiac contribution to DOE  Suggested patient/family investigate PTSD clinic at [facility]  Anxiety disorder, unspecified Patient with h/o PTSD and anxiety. Question if this plays a role in MG symptoms.  Plan Continue home meds  Suggested he investigate PTSD clinic at [facility] in search of possible mitigation of MG symptomatology  Type 2 diabetes mellitus with other specified complication (HCC) Last A1C 02/03/22 5.9%  Plan Continue home regimen  Check A1C  Sliding scale coverage.        DVT prophylaxis: Lovenox Code Status: Full Code Family Communication: wife and daughter present. They understand tx plan. They are receptive to making inquiries as to PTSD clinic at [facility].   Disposition Plan: home 24-48 hrs  Consults called: neurology - Dr. Amada Jupiter  Admission status: Observation, Med-Surg   Illene Regulus, MD [facility] 06/16/2022, 3:09 PM

## 2022-06-16 NOTE — Progress Notes (Signed)
igned      Pt performed with good effort NIF -20

## 2022-06-16 NOTE — ED Notes (Signed)
Pt went to MRI, but then refused due to feeling like he can't breathe when laying down.

## 2022-06-16 NOTE — ED Notes (Signed)
Pt to MRI

## 2022-06-16 NOTE — Progress Notes (Signed)
Pt came to MRI and told the transporter he refused to do exam before speaking to MRI Staff.

## 2022-06-16 NOTE — ED Provider Notes (Signed)
[place] EMERGENCY DEPARTMENT AT [facility] Provider Note   CSN: [id number] Arrival date & time: 06/16/22  0600     History  Chief Complaint  Patient presents with   Myasthenia Gravis    Right side weakness/Eyelid droop    [name] is a 67 y.o. male.  Patient with history of myasthenia gravis, hepatitis B, T2DM, hypertension presents today with complaints of myasthenia gravis flare. He states that over the past 4 weeks he has been having worsening ptosis in his right eye as well as right face and arm weakness. He also reports difficulty swallowing and difficulty breathing. He saw his neurologist at [facility] for same on 5/09 and they suspected that the patients PTSD and anxiety was a component of symptoms and recommended increasing his prednisone from 5 mg to 10 mg. He states that when he takes the prednisone he feels better and his symptoms improve, however in about 2-3 hours they return. He states that he presented here in 2019 with similar symptoms and was admitted for myasthenia gravis exacerbation and given IVIG. He states he feels similar today. Symptoms only involve the right side, denies right leg involvement. Denies any headaches, dizziness, chest pain, cough, congestion, nausea, vomiting.   The history is provided by the patient. No language interpreter was used.       Home Medications Prior to Admission medications   Medication Sig Start Date End Date Taking? Authorizing Provider  ampicillin (PRINCIPEN) 500 MG capsule Take by mouth. 08/07/20   [provider]  calcium carbonate (OS-CAL) 600 MG TABS Take 600 mg by mouth 3 (three) times daily with meals.    [provider]  Cholecalciferol (VITAMIN D) 2000 UNITS tablet Take 2,000 Units by mouth daily.    [provider]  colesevelam (WELCHOL) 625 MG tablet Take 3 tablets (1,875 mg total) by mouth daily. 05/24/22   Shamleffer, Konrad Dolores, MD  desonide (DESONATE) 0.05 % gel Apply 1  application topically 2 (two) times daily as needed.     [provider]  entecavir (BARACLUDE) 1 MG tablet Take 1 tablet (1 mg total) by mouth daily. 07/13/11   Brooke Dare, MD  fluocinonide ointment (LIDEX) 0.05 % APPLY TO AFFECTED AREA(S) OF THE SKIN daily AS NEEDED. By dermatologist. 10/04/21   [place], [name], MD  FLUoxetine (PROZAC) 20 MG capsule TAKE ONE CAPSULE BY MOUTH DAILY 01/25/22   [place], [name], MD  glucose blood (ONETOUCH ULTRA) test strip USE TO TEST BLOOD SUGAR 3 TIMES DAILY 12/10/20   Romero Belling, MD  ketoconazole (NIZORAL) 2 % shampoo Apply 1 application topically daily as needed for irritation. 01/07/21   [place], [name], MD  Lancets [facility] ULTRASOFT) lancets USE AS INSTRUCTED TO CHECK BLOOD SUGARS THREE TIMES A DAY 01/13/19   Romero Belling, MD  metFORMIN (GLUCOPHAGE) 500 MG tablet Take 2 tablets (1,000 mg total) by mouth 2 (two) times daily. 02/03/22   Shamleffer, Konrad Dolores, MD  olmesartan (BENICAR) 20 MG tablet TAKE ONE TABLET BY MOUTH DAILY *DISCONTINUE DILITIAZEM* 05/28/22   Shamleffer, Konrad Dolores, MD  omeprazole (PRILOSEC) 20 MG capsule TAKE 1 CAPSULE BY MOUTH DAILY 05/29/22   [place], [name], MD  pimecrolimus (ELIDEL) 1 % cream Apply 1 application topically 2 (two) times daily as needed.  01/01/13   [provider]  predniSONE (DELTASONE) 5 MG tablet Take 10 mg by mouth as directed.    [provider]  pyridostigmine (MESTINON) 60 MG tablet Take 60 mg by mouth  daily. Patient not taking: Reported on 02/03/2022    [provider]  repaglinide (PRANDIN) 0.5 MG tablet Take 1 tablet (0.5 mg total) by mouth daily before supper. 02/03/22   Shamleffer, Konrad Dolores, MD  sitaGLIPtin (JANUVIA) 100 MG tablet Take 1 tablet (100 mg total) by mouth daily. 02/03/22   Shamleffer, Konrad Dolores, MD  zolpidem (AMBIEN) 10 MG tablet TAKE 1/2 TO 1 TABLET (0.5-1) BY MOUTH EVERY NIGHT AT BEDTIME AS NEEDED 05/29/22   [place], [name], MD       Allergies    Azithromycin and Beta adrenergic blockers    Review of Systems   Review of Systems  All other systems reviewed and are negative.   Physical Exam Updated Vital Signs BP (!) 136/90   Pulse (!) 47   Temp 98.3 F (36.8 C) (Oral)   Resp (!) 24   SpO2 100%  Physical Exam Vitals and nursing note reviewed.  Constitutional:      General: He is not in acute distress.    Appearance: Normal appearance. He is normal weight. He is not ill-appearing, toxic-appearing or diaphoretic.  HENT:     Head: Normocephalic and atraumatic.  Cardiovascular:     Rate and Rhythm: Normal rate.  Pulmonary:     Effort: Pulmonary effort is normal. No respiratory distress.  Musculoskeletal:        General: Normal range of motion.     Cervical back: Normal range of motion.  Skin:    General: Skin is warm and dry.  Neurological:     General: No focal deficit present.     Mental Status: He is alert and oriented to person, place, and time.     GCS: GCS eye subscore is 4. GCS verbal subscore is 5. GCS motor subscore is 6.     Sensory: Sensation is intact.     Motor: Motor function is intact.     Coordination: Coordination is intact.     Gait: Gait is intact.     Comments: Alert and oriented to self, place, time and event.    Speech is fluent, clear without dysarthria or dysphasia.    Strength 5/5 in upper/lower extremities (do not appreciate any RUE weakness)   Sensation intact in upper/lower extremities    CN I not tested  CN II grossly intact visual fields bilaterally. Did not visualize posterior eye.  CN III, IV, VI PERRLA and EOMs intact bilaterally. Ptosis in the right eye. Patient endorses blurred vision in this eye as well CN V Intact sensation to sharp and light touch to the face  CN VII facial movements symmetric  CN VIII not tested  CN IX, X no uvula deviation, symmetric rise of soft palate  CN XI 5/5 SCM and trapezius strength bilaterally  CN XII Midline tongue protrusion,  symmetric L/R movements   Psychiatric:        Mood and Affect: Mood normal.        Behavior: Behavior normal.     ED Results / Procedures / Treatments   Labs (all labs ordered are listed, but only abnormal results are displayed) Labs Reviewed  COMPREHENSIVE METABOLIC PANEL - Abnormal; Notable for the following components:      Result Value   Sodium 130 (*)    Chloride 92 (*)    Glucose, Bld 104 (*)    BUN 7 (*)    Alkaline Phosphatase 35 (*)    All other components within normal limits  CBC WITH DIFFERENTIAL/PLATELET  EKG EKG Interpretation  Date/Time:  Friday Jun 16 2022 06:13:42 EDT Ventricular Rate:  96 PR Interval:  151 QRS Duration: 84 QT Interval:  337 QTC Calculation: 426 R Axis:   64 Text Interpretation: Sinus rhythm Confirmed by Tilden Fossa [phone]) on 06/16/2022 6:15:14 AM  Radiology DG Chest 2 View  Result Date: 06/16/2022 CLINICAL DATA:  shortness of breath EXAM: CHEST - 2 VIEW COMPARISON:  Chest XR, 12/02/2007.  CT chest, 223 2012. FINDINGS: Cardiomediastinal silhouette is within normal limits. Surgical clips at the superior mediastinum. Lungs are well inflated. No focal consolidation or mass. No pleural effusion or pneumothorax. Median sternotomy. No acute displaced fracture. IMPRESSION: Normal chest. Electronically Signed   By: Roanna Banning M.D.   On: 06/16/2022 08:25    Procedures .Critical Care  Performed by: Silva Bandy, PA-C Authorized by: Silva Bandy, PA-C   Critical care provider statement:    Critical care time (minutes):  30   Critical care start time:  06/16/2022 7:00 AM   Critical care end time:  06/16/2022 7:30 AM   Critical care was necessary to treat or prevent imminent or life-threatening deterioration of the following conditions: myasthenia gravis with acute exacerbation requiring IVIG.   Critical care was time spent personally by me on the following activities:  Development of treatment plan with patient or surrogate, discussions  with consultants, discussions with primary provider, obtaining history from patient or surrogate, ordering and review of laboratory studies, pulse oximetry, re-evaluation of patient's condition and review of old charts   Care discussed with: admitting provider       Medications Ordered in ED Medications  LORazepam (ATIVAN) injection 0.5 mg (2 mg Intravenous Not Given 06/16/22 0742)  Immune Globulin 10% (PRIVIGEN) IV infusion 400 mg/kg (has no administration in time range)  Immune Globulin 10% (PRIVIGEN) IV infusion 800 mg/kg (has no administration in time range)    ED Course/ Medical Decision Making/ A&P                             Medical Decision Making Amount and/or Complexity of Data Reviewed Labs: ordered. Radiology: ordered.  Risk Prescription drug management. Decision regarding hospitalization.   This patient is a 67 y.o. male who presents to the ED for concern of myasthenia gravis exacerbation, this involves an extensive number of treatment options, and is a complaint that carries with it a high risk of complications and morbidity. The emergent differential diagnosis prior to evaluation includes, but is not limited to,  CVA, neoplasm, respiratory distress, myasthenia gravis exacerbation . This is not an exhaustive differential.   Past Medical History / Co-morbidities / Social History: history of myasthenia gravis, hepatitis B, T2DM, hypertension  Additional history: Chart reviewed. Pertinent results include: patient saw his neurologist at [facility] on 5/09 and had his 5 mg prednisone increased to 10mg . Chart review reveals it appears his neurologist suspected that his PTSD and anxiety were a contributing factor to symptoms.   Physical Exam: Physical exam performed. The pertinent findings include: right eye ptosis. No other focal deficits.  Lab Tests: I ordered, and personally interpreted labs.  The pertinent results include:  Na 130, chloride 92. No other acute laboratory  findings   Imaging Studies: I ordered imaging studies including CXR. I independently visualized and interpreted imaging which showed NAD. I agree with the radiologist interpretation.  MRI was attempted per neurology recommendations, however patient did not feel like he could lay flat without  being short of breath   Cardiac Monitoring:  The patient was maintained on a cardiac monitor.  My attending physician Dr. Madilyn Hook viewed and interpreted the cardiac monitored which showed an underlying rhythm of: sinus rhythm. No STEMI. I agree with this interpretation.   Medications: I ordered medication including IVIG  for myasthenia gravis exacerbation.   Consultations Obtained: I requested consultation with the neurology on call Dr. Amada Jupiter,  and discussed lab and imaging findings as well as pertinent plan - they recommend: give IVIG for myasthenia gravis crisis and admit to medicine   Disposition: After consideration of the diagnostic results and the patients response to treatment, I feel that patient will require admission for myasthenia gravis with acute exacerbation.  Patient is in agreement with plan.  Discussed patient with hospitalist who agrees to admit.  I discussed this case with my attending physician Dr. Particia Nearing who cosigned this note including patient's presenting symptoms, physical exam, and planned diagnostics and interventions. Attending physician stated agreement with plan or made changes to plan which were implemented.     Final Clinical Impression(s) / ED Diagnoses Final diagnoses:  Myasthenia gravis with acute exacerbation [facility])    Rx / DC Orders ED Discharge Orders     None         Vear Clock 06/16/22 1230    Jacalyn Lefevre, MD 06/17/22 [phone]

## 2022-06-16 NOTE — ED Triage Notes (Signed)
Patient reports worsening weakness at right side with right eyelid drooping for 4 weeks , history of myasthenia gravis . Respirations unlabored , denies pain , alert and oriented .

## 2022-06-16 NOTE — Assessment & Plan Note (Signed)
Established diagnosis. Records reviewed. Last [facility] neurology visit Jun 08, 2022 when prednisone changed to 10 mg daily and mestinon increased to 60 mg TID. Now with exacerbation of symptoms: OD ptosis, chewing difficulty, swallow difficulty, DOE. He has no record of cardiac evaluation as possible contributing factor to DOE.  Plan  Continue home regimen: prednisone 10 mg daily, mestinon 60 mg TID  IVIG infusion - as recommended by neurology  2D echo - r/o any cardiac contribution to DOE

## 2022-06-16 NOTE — Assessment & Plan Note (Addendum)
Long standing problem as reviewed. Last neurology visit at [facility] Jun 08, 2022: prednisone dose 10 mg daily, mestinon does increased to 60 mg TID. Now with worsening symptoms: OD ptosis, weakness in chewing, difficulty with swallow and increased DOE. Question if PTSD is a contributing factor to his symptomatology. He has not had cardiac evaluation in regard to DOE.  Plan Med-surg obs admit  Continue home regimen: prednisone 10 mg daily, mestinon 60 mg TID  IVIG as recommended by neurology  2D echo to r/o cardiac contribution to DOE  Suggested patient/family investigate PTSD clinic at [facility]

## 2022-06-16 NOTE — Assessment & Plan Note (Signed)
Patient with h/o PTSD and anxiety. Question if this plays a role in MG symptoms.  Plan Continue home meds  Suggested he investigate PTSD clinic at [facility] in search of possible mitigation of MG symptomatology

## 2022-06-16 NOTE — Consult Note (Signed)
Neurology Consultation    Reason for Consult:Myasthenia Gravis management   CC: Right sided weakness, Right eyelid drooping   HISTORY OF PRESENT ILLNESS   HPI   [name] is a 67 y.o. male with a past medical history of seropositive myasthenia gravis, malignant thymoma s/p radiation and resection in 2003,  PTSD, DM2, hepatitis B, anxiety, depression, and insomnia. He follows with Dr. Jeremy Johann at [facility] and saw her last on 06/08/2022. He has received IVIG and PLEX in the past for exacerbations, both with therapeutic effect. At his visit on 5/9 he stated that he felt he is weak all over with head drop, right arm weakness, SOB, chewing difficulty. She increased his prednisone to 10mg  daily. He states that this helps initially however, he feels that the effects where off after a few hours. He endorses difficulty with chewing and swallowing, states that he is only been eating soup or rice as he feels his jaw locks and stops working. He feels that his head is heavy. He has to assist his right arm with his left when doing ADLs such as eating and brushing his teeth. He states that this downward trajectory started at the beginning of April.   5/17- NIF -20; VC 2.9L  History is obtained from: Patient, chart review    ROS: Pertinent items noted in HPI and remainder of comprehensive ROS otherwise negative.   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 No family history on file.  Allergies:  Allergies  Allergen Reactions   Azithromycin Other (See Comments)    REACTION: exac of his mg   Beta Adrenergic Blockers Other (See Comments)    REACTION: exac of mg    Social History:   reports that he has never smoked. He has never used smokeless tobacco. He reports that he does not drink alcohol and does not use drugs.    Medications (Not in a hospital admission)   EXAMINATION    Current vital signs:    06/16/2022    8:45 AM 06/16/2022    8:30 AM 06/16/2022    8:00 AM  Vitals with BMI  Systolic  151 133  Diastolic  98 94  Pulse 95 91 94    Examination:  GENERAL: Awake, alert in NAD HEENT: - Normocephalic and atraumatic, dry mm, no lymphadenopathy, no Thyromegally LUNGS - Regular, unlabored respirations CV - S1S2 RRR, equal pulses bilaterally. ABDOMEN - Soft, nontender, nondistended with normoactive BS Ext: warm, well perfused, intact peripheral pulses, no pedal edema Integumentary:  Skin intact on clothed exam  NEURO:  Mental Status: AA&Ox3  Language: speech is clear.  Intact naming, repetition, fluency, and comprehension. Counts to 13 on a single breath Neck flexion is 4/5 Cranial Nerves:  II: PERRL. Visual fields full to confrontation.  III, IV, VI: dysconjugate gaze. Right eye ptosis, eye is nearly closed. Endorses blurry  vision in the right eye V: Facial sensation intact VII: no facial asymmetry   VIII: hearing intact to voice IX, X:  [id number] shrug 5/5 and symmetrical  XII: tongue is midline without fasciculations. Motor:  RUE: grip   4/5    biceps  5/5    triceps  5/5         LUE: grip  5/5    biceps   5/5    triceps  5/5 RLE: thigh  5/5    knee   5/5     plantar flexion   5/5     dorsiflexion   5/5       LLE: thigh   5/5    knee  5/5    plantar flexion    5/5     dorsiflexion    5/5  Tone: is normal and bulk is normal Sensation- Intact to light touch and temperature  bilaterally Coordination: FTN intact bilaterally, no ataxia in BLE, no abnormal movements  Gait- deferred     LABS   I have reviewed labs in epic and the results pertinent to this consultation are:   Lab Results  Component Value Date   LDLCALC 68 07/23/2008   Lab Results  Component Value Date   ALT 15 06/16/2022   AST 21 06/16/2022   ALKPHOS 35 (L) 06/16/2022   BILITOT 0.6 06/16/2022   Lab Results  Component Value Date   HGBA1C 5.9 (A) 02/03/2022   Lab Results  Component Value Date   WBC 9.0 06/16/2022   HGB 14.3 06/16/2022   HCT 41.9 06/16/2022   MCV 83.8 06/16/2022   PLT 288 06/16/2022   No results found for: "VITAMINB12" No results found for: "FOLATE" Lab Results  Component Value Date   NA 130 (L) 06/16/2022   K 3.5 06/16/2022   CL 92 (L) 06/16/2022   CO2 27 06/16/2022     DIAGNOSTIC IMAGING/PROCEDURES   I have reviewed the images obtained:, as below    ASSESSMENT/PLAN    Assessment:  67 y.o. male with a pertinent medical history of past medical history of seropositive myasthenia gravis, malignant thymoma s/p radiation and resection in 2003,  PTSD, DM2, hepatitis B, anxiety, depression, and insomnia. He follows with Dr. Jeremy Johann at [facility] and saw her last on 06/08/2022. He has received IVIG and PLEX in the past for exacerbations, both with therapeutic effect. At his visit on 5/9 he stated that he felt he is weak all over with head drop, right arm weakness, SOB, chewing difficulty. She increased his prednisone to 10mg  daily.   Impression: MG exacerbation   Recommendations: - IVIG 400mg /kg today and then 800mg /kg for 2 doses  - Every 8 hours NIFs and VC -Continue home prednisone - NPO until swallow eval by SLP. - Recommend elective intubation for respiratory compromise if VC falls below 15 to 20 mL/kg. If poor effort and not sure, can always get ABG to assess for CO2 retention. Elective intubation for airway cmopromise if she has difficulty  clearing her secretions. Oxygen saturation should not be used to make decision regarding intubation. - Medications that may worsen or trigger MG exacerbation: Class IA antiarrhythmics, magnesium, flouroquinolones, macrolides, aminoglycosides, penicillamine, curare, interferon alpha, botox, quinine. Use with caution: calcium channel blocker, beta blockers and statins.  **This documentation was dictated using Dragon Medical Software and may contain inadvertent errors **  Armida Sans, NP   I have seen the patient and reviewed the above note.  Mr. Gessert has antibody  proven myasthenia gravis and reports fairly rapid worsening over the past couple of weeks.  I discussed with him options including increasing steroids, versus some type of rescue therapy.  Given his antecedent trouble with anxiety and sleep, as well as his rapid worsening, I am concerned that increasing the steroids may precipitate further worsening as opposed to improvement.  With him stating that he is having a great deal of difficulty with tolerating p.o., coupled with his worsening ptosis I think that we should consider rescue therapy with IVIG.  He has responded to this well in the past.  The NIF that is documented seems very discordant from his vital capacity and physical exam and I suspect that he was making a poor seal.  We will continue to monitor this.  Ritta Slot, MD [facility] [phone]  If 7pm- 7am, please page neurology on call as listed in AMION.

## 2022-06-16 NOTE — Assessment & Plan Note (Signed)
Last A1C 5.9% Glucose here controlled - Continue SS corrections  - Continue Prandin, metformin, DPPIV

## 2022-06-16 NOTE — Progress Notes (Signed)
Pt performed with good effort NIF -20 VC 2.9L

## 2022-06-16 NOTE — Subjective & Objective (Signed)
[name], a 67 y/o with history of myasthenia gravis, hepatitis B, T2DM, hypertension presents today with complaints of myasthenia gravis flare. He states that over the past 4 weeks he has been having worsening ptosis in his right eye as well as right face and arm weakness. He also reports difficulty swallowing and difficulty breathing.   Record reviewed: onset MG 2003 s/p thymectomy for malignant thymoma. Treated with progressive doses of prednisone thru 2016. He had a plasma exchange in 2017 with good results. In 2018 with recurrent symptoms prednisone was gradually increased to 30 mg daily by 2019. In 2019 he had infusion of IGIG which helped ameliorate his symptoms of swallow difficulty and SOB. He saw his neurologist at [facility] for same on 06/08/22 and they suspected that the patients PTSD and anxiety was a component of his recurrent symptoms. Watching the news increases his anxiety and increases his symptoms. Neurology recommended increasing his prednisone from 5 mg to 10 mg. He states that when he takes the prednisone he feels better and his symptoms improve, however in about 2-3 hours they return. In addition, he was started on Mestinon and progressed to 15 mg qid.   Due to his increased symptoms and breathing concerns he presents to [facility] for evaluation.

## 2022-06-16 NOTE — Progress Notes (Signed)
   06/16/22 1658  Spiritual Encounters  Type of Visit Initial  Care provided to: Family  Referral source Family  OnCall Visit No  Spiritual Framework  Presenting Themes Community and relationships;Other (comment)  Community/Connection Family  Interventions  Spiritual Care Interventions Made Compassionate presence  Intervention Outcomes  Outcomes Reduced anxiety   Patient's spouse found wandering through hospital looking for where patient was taken. Returned patient to ED and located room patient was taken to. Excoriated spouse to 2W and reunited her with patient.

## 2022-06-17 DIAGNOSIS — G7001 Myasthenia gravis with (acute) exacerbation: Secondary | ICD-10-CM | POA: Diagnosis present

## 2022-06-17 DIAGNOSIS — Z7952 Long term (current) use of systemic steroids: Secondary | ICD-10-CM | POA: Diagnosis not present

## 2022-06-17 DIAGNOSIS — G7000 Myasthenia gravis without (acute) exacerbation: Secondary | ICD-10-CM | POA: Diagnosis present

## 2022-06-17 DIAGNOSIS — Z923 Personal history of irradiation: Secondary | ICD-10-CM | POA: Diagnosis not present

## 2022-06-17 DIAGNOSIS — Z79899 Other long term (current) drug therapy: Secondary | ICD-10-CM | POA: Diagnosis not present

## 2022-06-17 DIAGNOSIS — E1169 Type 2 diabetes mellitus with other specified complication: Secondary | ICD-10-CM | POA: Diagnosis present

## 2022-06-17 DIAGNOSIS — E871 Hypo-osmolality and hyponatremia: Secondary | ICD-10-CM | POA: Insufficient documentation

## 2022-06-17 DIAGNOSIS — Z7984 Long term (current) use of oral hypoglycemic drugs: Secondary | ICD-10-CM | POA: Diagnosis not present

## 2022-06-17 DIAGNOSIS — F4310 Post-traumatic stress disorder, unspecified: Secondary | ICD-10-CM | POA: Diagnosis present

## 2022-06-17 DIAGNOSIS — Z881 Allergy status to other antibiotic agents status: Secondary | ICD-10-CM | POA: Diagnosis not present

## 2022-06-17 DIAGNOSIS — I10 Essential (primary) hypertension: Secondary | ICD-10-CM | POA: Diagnosis present

## 2022-06-17 DIAGNOSIS — E785 Hyperlipidemia, unspecified: Secondary | ICD-10-CM | POA: Diagnosis present

## 2022-06-17 DIAGNOSIS — R1310 Dysphagia, unspecified: Secondary | ICD-10-CM | POA: Diagnosis present

## 2022-06-17 DIAGNOSIS — Z85238 Personal history of other malignant neoplasm of thymus: Secondary | ICD-10-CM | POA: Diagnosis not present

## 2022-06-17 DIAGNOSIS — B1910 Unspecified viral hepatitis B without hepatic coma: Secondary | ICD-10-CM | POA: Diagnosis present

## 2022-06-17 DIAGNOSIS — R29810 Facial weakness: Secondary | ICD-10-CM | POA: Diagnosis present

## 2022-06-17 LAB — BASIC METABOLIC PANEL
Anion gap: 7 (ref 5–15)
BUN: 7 mg/dL — ABNORMAL LOW (ref 8–23)
CO2: 27 mmol/L (ref 22–32)
Calcium: 8.6 mg/dL — ABNORMAL LOW (ref 8.9–10.3)
Chloride: 94 mmol/L — ABNORMAL LOW (ref 98–111)
Creatinine, Ser: 0.89 mg/dL (ref 0.61–1.24)
GFR, Estimated: >60 mL/min (ref >60)
Glucose, Bld: 90 mg/dL (ref 70–99)
Potassium: 3.6 mmol/L (ref 3.5–5.1)
Sodium: 128 mmol/L — ABNORMAL LOW (ref 135–145)

## 2022-06-17 LAB — GLUCOSE, CAPILLARY
Glucose-Capillary: 83 mg/dL (ref 70–99)
Glucose-Capillary: 209 mg/dL — ABNORMAL HIGH (ref 70–99)
Glucose-Capillary: 112 mg/dL — ABNORMAL HIGH (ref 70–99)

## 2022-06-17 NOTE — Progress Notes (Signed)
Pt performed VC 2.3. NIF -30 With good pt effort.

## 2022-06-17 NOTE — Assessment & Plan Note (Signed)
BP normal - Continue irbesartan

## 2022-06-17 NOTE — Hospital Course (Signed)
[name] is a 67 y.o. M with myasthenia gravis, malignant thymoma s/p radX/resection, DM and HTN who presented with worsening symptoms head drop, right arm weakness and difficulty swallowing.  Had recently been to his Neurologist, who increased his prednisone for these symptoms, but they progressed, so he came to the ER.

## 2022-06-17 NOTE — Progress Notes (Addendum)
Patient performed NIF -20 and VC 3.0L. Good patient effort.

## 2022-06-17 NOTE — Progress Notes (Signed)
Neurology Progress Note  Brief HPI: [name] is a 67 y.o. male with a past medical history of seropositive myasthenia gravis, malignant thymoma s/p radiation and resection in 2003,  PTSD, DM2, hepatitis B, anxiety, depression, and insomnia. He follows with Dr. Jeremy Johann at [facility] and saw her last on 06/08/2022. He has received IVIG and PLEX in the past for exacerbations, both with therapeutic effect. At his visit on 5/9 he stated that he felt he is weak all over with head drop, right arm weakness, SOB, chewing difficulty. She increased his prednisone to 10mg  daily. He states that this helps initially however, he feels that the effects where off after a few hours. He endorses difficulty with chewing and swallowing, states that he is only been eating soup or rice as he feels his jaw locks and stops working. He feels that his head is heavy. He has to assist his right arm with his left when doing ADLs such as eating and brushing his teeth. He states that this downward trajectory started at the beginning of April.   5/17- NIF -20; VC 2.9L  5/18- NIF -20  Subjective: Patient seen in room. Feeling better this morning and ordering breakfast. States he will touch base with his neurologist after he is discharged.  Exam: Vitals:   06/17/22 0501 06/17/22 0832  BP: 126/81 (!) 135/95  Pulse: 87 96  Resp:  17  Temp: 98.1 F (36.7 C) 99.9 F (37.7 C)  SpO2: 99% 100%   Gen: In bed, NAD Resp: non-labored breathing, no acute distress Abd: soft, nt  NEURO:  Mental Status: AA&Ox3  Language: speech is clear.  Intact naming, repetition, fluency, and comprehension. Counts to 13 on a single breath Neck flexion is 4/5 Cranial Nerves:  II: PERRL. Visual fields full to confrontation.  III, IV, VI: dysconjugate gaze. Right eye ptosis, eye is nearly closed. Endorses blurry vision in the right eye V: Facial sensation intact VII: no facial asymmetry   VIII: hearing intact to voice IX, X:   ZO:XWRUEAVW shrug 5/5 and symmetrical  XII: tongue is midline without fasciculations. Motor:  RUE: grip   5/5    biceps  5/5    triceps  5/5                          LUE: grip  5/5    biceps   5/5    triceps  5/5 RLE: thigh  5/5    knee   5/5     plantar flexion   5/5     dorsiflexion   5/5       LLE: thigh   5/5    knee  5/5    plantar flexion    5/5     dorsiflexion    5/5  Tone: is normal and bulk is normal Sensation- Intact to light touch and temperature bilaterally Coordination: FTN intact bilaterally, no ataxia in BLE, no abnormal movements  Gait- deferred     Pertinent Labs: Na 128   Imaging Reviewed: MRI C-Spine 1. Soft disc extrusion at C5-6 to the right of midline which could affect the right C6 and C7 nerves. 2. Slight bilateral foraminal narrowing at C6-7.  Assessment:  67 y.o. male with a pertinent medical history of past medical history of seropositive myasthenia gravis, malignant thymoma s/p radiation and resection in 2003,  PTSD, DM2, hepatitis B, anxiety, depression, and insomnia. He follows with Dr. Jeremy Johann at [facility]  and saw her last on 06/08/2022. He has received IVIG and PLEX in the past for exacerbations, both with therapeutic effect. At his visit on 5/9 he stated that he felt he is weak all over with head drop, right arm weakness, SOB, chewing difficulty. She increased his prednisone to 10mg  daily. He received IVIG 400mg /kg and tolerated this well. Plan for IVIG 800mg /kg today and then tomorrow.    Impression: MG exacerbation    Recommendations: - IVIG 400mg /kg given on 5/17 and then 800mg /kg for 2 doses  - Every 8 hours NIFs and VC -Continue home prednisone - NPO until swallow eval by SLP. - Recommend elective intubation for respiratory compromise if VC falls below 15 to 20 mL/kg. If poor effort and not sure, can always get ABG to assess for CO2 retention. Elective intubation for airway cmopromise if she has difficulty clearing her secretions.  Oxygen saturation should not be used to make decision regarding intubation. - Medications that may worsen or trigger MG exacerbation: Class IA antiarrhythmics, magnesium, flouroquinolones, macrolides, aminoglycosides, penicillamine, curare, interferon alpha, botox, quinine. Use with caution: calcium channel blocker, beta blockers and statins.   Patient seen and examined by NP/APP with MD. MD to update note as needed.   Elmer Picker, DNP, FNP-BC [facility] Pager: [phone]   I have seen the patient and reviewed the above note.  He reports significant improvement, and he is able to count to 18 on a single breath.  His nifs have been low, but discordant with his vital capacity.  I suspect that he is either not making a good seal or not giving good effort.  He is improving, and will finish his course of IVIG tomorrow.  Ritta Slot, MD [facility] [phone]  If 7pm- 7am, please page neurology on call as listed in AMION.

## 2022-06-17 NOTE — Progress Notes (Signed)
  Progress Note   Patient: [name] WUJ:[id number] DOB: 1955-03-08 DOA: 06/16/2022     0 DOS: the patient was seen and examined on 06/17/2022 at 8:31AM      Brief hospital course: [name] is a 67 y.o. M with myasthenia gravis, malignant thymoma s/p radX/resection, DM and HTN who presented with worsening symptoms head drop, right arm weakness and difficulty swallowing.  Had recently been to his Neurologist, who increased his prednisone for these symptoms, but they progressed, so he came to the ER.     Assessment and Plan: * Myasthenia gravis with acute exacerbation (HCC) - Continue prednisone, Mestinon - Consult Neuro, appreciate cares - IVIG for 3 days, per Neuro - Follow echo report     Anxiety disorder, unspecified - Continue Xanax, Prozac  Type 2 diabetes mellitus with other specified complication (HCC) Last A1C 5.9% Glucose here controlled - Continue SS corrections  - Continue Prandin, metformin, DPPIV  Hepatitis B - Continue entecavir  Hypertension, essential, benign BP normal - Continue irbesartan  Hyponatremia Mild, asymptomatic.            Subjective: Patient feels "better".  No fever, confusion, respiratory symptoms.     Physical Exam: BP 138/80 (BP Location: Left Arm)   Pulse (!) 101   Temp 97.8 F (36.6 C) (Oral)   Resp 16   Ht 5\' 5"  (1.651 m)   Wt 53 kg   SpO2 98%   BMI 19.44 kg/m   Adult male, sitting up in bed, no acute distress Tachycardic, regular, no murmurs, no peripheral edema Respiratory rate normal, lungs clear without rales or wheezes He has some right eye ptosis, seems to have some right upper extremity weakness, but overall seems better, speech seems fluent, oriented to person place and time    Data Reviewed: BMP shows mild hyponatremia   Family Communication: None present    Disposition: Status is: Inpatient Will require 3 days total of intravenous  treatment.            Author: Alberteen Sam, MD 06/17/2022 12:54 PM  For on call review [email or web address].

## 2022-06-17 NOTE — Assessment & Plan Note (Signed)
Continue entecavir

## 2022-06-18 DIAGNOSIS — G7001 Myasthenia gravis with (acute) exacerbation: Secondary | ICD-10-CM | POA: Diagnosis not present

## 2022-06-18 LAB — GLUCOSE, CAPILLARY
Glucose-Capillary: 121 mg/dL — ABNORMAL HIGH (ref 70–99)
Glucose-Capillary: 109 mg/dL — ABNORMAL HIGH (ref 70–99)
Glucose-Capillary: 131 mg/dL — ABNORMAL HIGH (ref 70–99)
Glucose-Capillary: 115 mg/dL — ABNORMAL HIGH (ref 70–99)

## 2022-06-18 NOTE — Progress Notes (Signed)
Neurology Progress Note  Brief HPI: [name] is a 67 y.o. male with a past medical history of seropositive myasthenia gravis, malignant thymoma s/p radiation and resection in 2003,  PTSD, DM2, hepatitis B, anxiety, depression, and insomnia. He follows with Dr. Jeremy Johann at [facility] and saw her last on 06/08/2022. He has received IVIG and PLEX in the past for exacerbations, both with therapeutic effect. At his visit on 5/9 he stated that he felt he is weak all over with head drop, right arm weakness, SOB, chewing difficulty. She increased his prednisone to 10mg  daily. He states that this helps initially however, he feels that the effects where off after a few hours. He endorses difficulty with chewing and swallowing, states that he is only been eating soup or rice as he feels his jaw locks and stops working. He feels that his head is heavy. He has to assist his right arm with his left when doing ADLs such as eating and brushing his teeth. He states that this downward trajectory started at the beginning of April.   5/17- NIF -20; VC 2.9L  5/18- NIF -20 VC 3.0L 5/19- NIF -30 VC 3.7L  Subjective: Last does of IVIG scheduled for today. Patient would like to be discharged after last dose. States his eye is open in the morning but progressively closes through out the day. On my exam today it is nearly closed. Plans to follow up with outpatient neurologist at discharged, states he will call her when gets home.   Exam: Vitals:   06/18/22 0817 06/18/22 0917  BP: (!) 130/93 134/88  Pulse: 92 90  Resp: 16 16  Temp: 98 F (36.7 C) 97.9 F (36.6 C)  SpO2: 100% 100%   Gen: In bed, NAD Resp: non-labored breathing, no acute distress Abd: soft, nt  NEURO:  Mental Status: AA&Ox3  Language: speech is clear.  Intact naming, repetition, fluency, and comprehension. Counts to 13 on a single breath Neck flexion is 4/5 Cranial Nerves:  II: PERRL. Visual fields full to confrontation.  III, IV,  VI: dysconjugate gaze on rightward gaze. Right eye ptosis, eye is nearly closed. Endorses blurry vision in the right eye V: Facial sensation intact VII: no facial asymmetry   VIII: hearing intact to voice IX, X:  ZO:XWRUEAVW shrug 5/5 and symmetrical  XII: tongue is midline without fasciculations. Motor:  RUE: grip   5/5    biceps  5/5    triceps  5/5                          LUE: grip  5/5    biceps   5/5    triceps  5/5 RLE: thigh  5/5    knee   5/5     plantar flexion   5/5     dorsiflexion   5/5       LLE: thigh   5/5    knee  5/5    plantar flexion    5/5     dorsiflexion    5/5  Tone: is normal and bulk is normal Sensation- Intact to light touch and temperature bilaterally Coordination: FTN intact bilaterally, no ataxia in BLE, no abnormal movements  Gait- deferred     Pertinent Labs: Na 128   Imaging Reviewed: MRI C-Spine 1. Soft disc extrusion at C5-6 to the right of midline which could affect the right C6 and C7 nerves. 2. Slight bilateral foraminal narrowing at C6-7.  Assessment:  67 y.o. male with a pertinent medical history of past medical history of seropositive myasthenia gravis, malignant thymoma s/p radiation and resection in 2003,  PTSD, DM2, hepatitis B, anxiety, depression, and insomnia. He follows with Dr. Jeremy Johann at [facility] and saw her last on 06/08/2022. He has received IVIG and PLEX in the past for exacerbations, both with therapeutic effect. At his visit on 5/9 he stated that he felt he is weak all over with head drop, right arm weakness, SOB, chewing difficulty. She increased his prednisone to 10mg  daily. He received IVIG 400mg /kg and tolerated this well. Plan for IVIG 800mg /kg today and then tomorrow.    Impression: MG exacerbation    Recommendations: - IVIG 400mg /kg given on 5/17 and then 800mg /kg for 2 doses - Ends 5/19 -Continue home prednisone - Medications that may worsen or trigger MG exacerbation: Class IA antiarrhythmics, magnesium,  flouroquinolones, macrolides, aminoglycosides, penicillamine, curare, interferon alpha, botox, quinine. Use with caution: calcium channel blocker, beta blockers and statins.  Patient seen and examined by NP/APP with MD. MD to update note as needed.   Elmer Picker, DNP, FNP-BC [facility] Pager: [phone]  I have seen the patient, he is subjectively improving.  His vital capacities have been steadily increasing.  His negative inspiratory force is slightly higher today than previous, it is still discordant from his vital capacity and I suspect some degree of seal difficulty.  With him improving, after his IVIG I would have him follow-up with his outpatient neurologist.  Neurology will be available on an as-needed basis.  Ritta Slot, MD [facility] [phone]  If 7pm- 7am, please page neurology on call as listed in AMION.

## 2022-06-18 NOTE — Discharge Summary (Signed)
Physician Discharge Summary   Patient: [name] MRN: [id number] DOB: [birth year]  Admit date:     06/16/2022  Discharge date: 06/18/22  Discharge Physician: Alberteen Sam   PCP: [place], [name], MD     Recommendations at discharge:  Follow up with Dr. Georgina Pillion Neurology as soon as able     Discharge Diagnoses: Principal Problem:   Myasthenia gravis with acute exacerbation (HCC) Active Problems:   Type 2 diabetes mellitus with other specified complication (HCC)   Anxiety disorder, unspecified   Hypertension, essential, benign   Hepatitis B   Hyponatremia     Hospital Course: [name] is a 67 y.o. M with myasthenia gravis, malignant thymoma s/p radX/resection, DM and HTN who presented with worsening symptoms head drop, right arm weakness and difficulty swallowing.  Had recently been to his Neurologist, who increased his prednisone for these symptoms, but they progressed, so he came to the ER.       * Myasthenia gravis with acute exacerbation [facility]) Patient was admitted and neurology were consulted.  He was started on IVIG, and although objectively his symptoms did not appear to change, the patient reported resolution of his jaw stiffness, normalization of his swallowing, and improvement in his other symptoms.    Neurology felt there was some uncertainty about whether this was a true flare but as he improved symptomatically, the 3 day course of IVIG was completed and he was discharged with with Neuro follow up.    Anxiety disorder, unspecified On Xanax, Prozac  Type 2 diabetes mellitus with other specified complication (HCC) Last A1C 5.9% On Prandin, metformin, DPPIV  Hepatitis B On entecavir  Hypertension, essential, benign BP controlled on irbesartan            The [facility] Controlled Substances Registry was reviewed for this patient prior to discharge.  Consultants: Neurology Procedures performed: None  Disposition: Home Diet  recommendation:  Regular diet  DISCHARGE MEDICATION: Allergies as of 06/18/2022       Reactions   Azithromycin Other (See Comments)   REACTION: exac of his mg   Beta Adrenergic Blockers Other (See Comments)   REACTION: exac of mg        Medication List     TAKE these medications    ampicillin 500 MG capsule Commonly known as: PRINCIPEN Take by mouth.   calcium carbonate 600 MG Tabs tablet Commonly known as: OS-CAL Take 600 mg by mouth 3 (three) times daily with meals.   colesevelam 625 MG tablet Commonly known as: WELCHOL Take 3 tablets (1,875 mg total) by mouth daily.   desonide 0.05 % gel Commonly known as: DESONATE Apply 1 application topically 2 (two) times daily as needed.   entecavir 1 MG tablet Commonly known as: BARACLUDE Take 1 tablet (1 mg total) by mouth daily.   fluocinonide ointment 0.05 % Commonly known as: LIDEX APPLY TO AFFECTED AREA(S) OF THE SKIN daily AS NEEDED. By dermatologist.   FLUoxetine 20 MG capsule Commonly known as: PROZAC TAKE ONE CAPSULE BY MOUTH DAILY   ibuprofen 200 MG tablet Commonly known as: ADVIL Take 400 mg by mouth every 8 (eight) hours as needed for headache or mild pain.   metFORMIN 500 MG tablet Commonly known as: GLUCOPHAGE Take 2 tablets (1,000 mg total) by mouth 2 (two) times daily.   olmesartan 20 MG tablet Commonly known as: BENICAR TAKE ONE TABLET BY MOUTH DAILY *DISCONTINUE DILITIAZEM*   omeprazole 20 MG capsule Commonly known as: PRILOSEC TAKE 1 CAPSULE  BY MOUTH DAILY   OneTouch Ultra test strip Generic drug: glucose blood USE TO TEST BLOOD SUGAR 3 TIMES DAILY   onetouch ultrasoft lancets USE AS INSTRUCTED TO CHECK BLOOD SUGARS THREE TIMES A DAY   pimecrolimus 1 % cream Commonly known as: ELIDEL Apply 1 application topically 2 (two) times daily as needed.   predniSONE 5 MG tablet Commonly known as: DELTASONE Take 10 mg by mouth as directed.   pyridostigmine 60 MG tablet Commonly known as:  MESTINON Take 60 mg by mouth 3 (three) times daily.   repaglinide 0.5 MG tablet Commonly known as: PRANDIN Take 1 tablet (0.5 mg total) by mouth daily before supper. What changed:  when to take this reasons to take this   sitaGLIPtin 100 MG tablet Commonly known as: Januvia Take 1 tablet (100 mg total) by mouth daily.   Vitamin D 50 MCG (2000 UT) tablet Take 2,000 Units by mouth daily.   zolpidem 10 MG tablet Commonly known as: AMBIEN TAKE 1/2 TO 1 TABLET (0.5-1) BY MOUTH EVERY NIGHT AT BEDTIME AS NEEDED        Follow-up Information     Evangeline Gula, MD. Call.   Specialties: Psychiatry, Neurology Contact information: [address] [place] [address] [phone]                 Discharge Instructions     Discharge instructions   Complete by: As directed    **IMPORTANT DISCHARGE INSTRUCTIONS**   From Dr. Maryfrances Bunnell: You were admitted to the hospital for a flare of your myasthenia gravis. Here, you were treated with IVIG.  You should resume all your home medicines as you were taking them before admission and call Dr. Rosanne Ashing office for further instructions (let her know you were in the hospital for 3 days of IVIG and ask her how to take the prednisone and when to come back to see her office)   Increase activity slowly   Complete by: As directed        Discharge Exam: Filed Weights   06/16/22 1103 06/16/22 1614  Weight: 53.1 kg 53 kg    General: Pt is alert, awake, not in acute distress, has right sided ptosis, improved on my exam from yesterday.   Cardiovascular: RRR, nl S1-S2, no murmurs appreciated.   No LE edema.   Respiratory: Normal respiratory rate and rhythm.  CTAB without rales or wheezes. Abdominal: Abdomen soft and non-tender.  No distension or HSM.      Condition at discharge: good  The results of significant diagnostics from this hospitalization (including imaging, microbiology, ancillary and laboratory) are  listed below for reference.   Imaging Studies: DG Chest 2 View  Result Date: 06/16/2022 CLINICAL DATA:  shortness of breath EXAM: CHEST - 2 VIEW COMPARISON:  Chest XR, 12/02/2007.  CT chest, 223 2012. FINDINGS: Cardiomediastinal silhouette is within normal limits. Surgical clips at the superior mediastinum. Lungs are well inflated. No focal consolidation or mass. No pleural effusion or pneumothorax. Median sternotomy. No acute displaced fracture. IMPRESSION: Normal chest. Electronically Signed   By: Roanna Banning M.D.   On: 06/16/2022 08:25    Microbiology: No results found for this or any previous visit.  Labs: CBC: Recent Labs  Lab 06/16/22 0722  WBC 9.0  NEUTROABS 4.6  HGB 14.3  HCT 41.9  MCV 83.8  PLT 288   Basic Metabolic Panel: Recent Labs  Lab 06/16/22 0722 06/17/22 0435  NA 130* 128*  K 3.5 3.6  CL 92* 94*  CO2 27 27  GLUCOSE 104* 90  BUN 7* 7*  CREATININE 1.02 0.89  CALCIUM 9.1 8.6*   Liver Function Tests: Recent Labs  Lab 06/16/22 0722  AST 21  ALT 15  ALKPHOS 35*  BILITOT 0.6  PROT 7.0  ALBUMIN 4.1   CBG: Recent Labs  Lab 06/17/22 0743 06/17/22 1653 06/17/22 2210 06/18/22 0814 06/18/22 1157  GLUCAP 83 209* 112* 121* 109*    Discharge time spent: approximately 25 minutes spent on discharge counseling, evaluation of patient on day of discharge, and coordination of discharge planning with nursing, social work, pharmacy and case management  Signed: Alberteen Sam, MD [facility] 06/18/2022

## 2022-06-18 NOTE — Plan of Care (Signed)

## 2022-06-18 NOTE — Progress Notes (Signed)
NIF performed -30. VC 3.7L. Great patient effort.

## 2022-06-18 NOTE — Plan of Care (Signed)

## 2022-06-18 NOTE — Evaluation (Signed)
Physical Therapy Evaluation Patient Details Name: [name] MRN: [id number] DOB: 11-Jan-1956 Today's Date: 06/18/2022  History of Present Illness  Pt is a 67 y.o. M who presents 06/16/2022 with complaints of myasthenia gravis flare; over the past 4 weeks he has been having worsening ptosis in the right eye, right face and arm weakness, difficulty swallowing, difficulty breathing. Significant PMH: myasthenia gravis, hepatitis B, T2DM, HTN.  Clinical Impression  PTA, pt lives with his family and is independent. Pt presents with mild right sided static balance deficits and decreased gait speed for age. Pt ambulating 300 ft with no assistive device and negotiated 2 steps with no rail without physical assist or difficulty. Able to perform high level balance tasks without overt loss of balance. Continues with R eye ptosis and blurred vision, but able to compensate well functionally and reports he does not drive. No further acute PT or follow up needs. Thank you for this consult.     Recommendations for follow up therapy are one component of a multi-disciplinary discharge planning process, led by the attending physician.  Recommendations may be updated based on patient status, additional functional criteria and insurance authorization.  Follow Up Recommendations       Assistance Recommended at Discharge PRN  Patient can return home with the following  Assist for transportation    Equipment Recommendations None recommended by PT  Recommendations for Other Services       Functional Status Assessment Patient has had a recent decline in their functional status and demonstrates the ability to make significant improvements in function in a reasonable and predictable amount of time.     Precautions / Restrictions Precautions Precautions: None Restrictions Weight Bearing Restrictions: No      Mobility  Bed Mobility               General bed mobility comments: Standing upon arrival     Transfers Overall transfer level: Independent Equipment used: None                    Ambulation/Gait Ambulation/Gait assistance: Modified independent (Device/Increase time) Gait Distance (Feet): 300 Feet Assistive device: None Gait Pattern/deviations: Step-through pattern Gait velocity: 1.66 ft/s Gait velocity interpretation: <1.8 ft/sec, indicate of risk for recurrent falls   General Gait Details: Increased R foot external rotation  Stairs Stairs: Yes Stairs assistance: Supervision Stair Management: No rails Number of Stairs: 2 General stair comments: step over step, increased time  Wheelchair Mobility    Modified Rankin (Stroke Patients Only)       Balance Overall balance assessment: Needs assistance               Single Leg Stance - Right Leg: 10 (increased lateral instability) Single Leg Stance - Left Leg: 10 Tandem Stance - Right Leg: 4 Tandem Stance - Left Leg: 10 Rhomberg - Eyes Opened: 10 Rhomberg - Eyes Closed: 10                 Pertinent Vitals/Pain Pain Assessment Pain Assessment: No/denies pain    Home Living Family/patient expects to be discharged to:: Private residence Living Arrangements: Spouse/significant other;Children Available Help at Discharge: Family Type of Home: House Home Access: Stairs to enter Entrance Stairs-Rails: None Entrance Stairs-Number of Steps: 2   Home Layout: One level        Prior Function Prior Level of Function : Independent/Modified Independent             Mobility Comments: Retired  Hand Dominance        Extremity/Trunk Assessment   Upper Extremity Assessment Upper Extremity Assessment: RUE deficits/detail;LUE deficits/detail RUE Deficits / Details: Strength 5/5 LUE Deficits / Details: Strength 5/5    Lower Extremity Assessment Lower Extremity Assessment: RLE deficits/detail;LLE deficits/detail RLE Deficits / Details: Strength 5/5 LLE Deficits / Details:  Strength 5/5    Cervical / Trunk Assessment Cervical / Trunk Assessment: Normal  Communication   Communication: No difficulties  Cognition Arousal/Alertness: Awake/alert Behavior During Therapy: WFL for tasks assessed/performed Overall Cognitive Status: Within Functional Limits for tasks assessed                                          General Comments      Exercises     Assessment/Plan    PT Assessment Patient does not need any further PT services  PT Problem List         PT Treatment Interventions      PT Goals (Current goals can be found in the Care Plan section)  Acute Rehab PT Goals Patient Stated Goal: return to baseline PT Goal Formulation: All assessment and education complete, DC therapy    Frequency       Co-evaluation               AM-PAC PT "6 Clicks" Mobility  Outcome Measure Help needed turning from your back to your side while in a flat bed without using bedrails?: None Help needed moving from lying on your back to sitting on the side of a flat bed without using bedrails?: None Help needed moving to and from a bed to a chair (including a wheelchair)?: None Help needed standing up from a chair using your arms (e.g., wheelchair or bedside chair)?: None Help needed to walk in hospital room?: None Help needed climbing 3-5 steps with a railing? : A Little 6 Click Score: 23    End of Session   Activity Tolerance: Patient tolerated treatment well Patient left: in bed;with call bell/phone within reach;with family/visitor present Nurse Communication: Mobility status PT Visit Diagnosis: Unsteadiness on feet (R26.81)    Time: 1191-4782 PT Time Calculation (min) (ACUTE ONLY): 16 min   Charges:   PT Evaluation $PT Eval Low Complexity: 1 Low          Lillia Pauls, PT, DPT Acute Rehabilitation Services Office [phone]   Norval Morton 06/18/2022, 1:14 PM

## 2022-06-19 DIAGNOSIS — G7001 Myasthenia gravis with (acute) exacerbation: Secondary | ICD-10-CM | POA: Diagnosis not present

## 2022-06-19 LAB — GLUCOSE, CAPILLARY: Glucose-Capillary: 91 mg/dL (ref 70–99)

## 2022-06-19 NOTE — Progress Notes (Signed)
Pt performed NIF of -25 with good effort.

## 2022-06-19 NOTE — Progress Notes (Signed)
NIF > -40  Pt has a planned discharge this morning, RRT educated pt and wife on NIF for monitoring at home.

## 2022-06-19 NOTE — TOC Transition Note (Signed)
Transition of Care [facility]) - CM/SW Discharge Note   Patient Details  Name: [name] MRN: [id number] Date of Birth: Sep 25, 1955  Transition of Care [facility]) CM/SW Contact:  Janae Bridgeman, RN Phone Number: 06/19/2022, 10:24 AM   Clinical Narrative:     Transition of Care (TOC) Screening Note   Patient Details  Name: [name] Date of Birth: 07-Mar-1955   Transition of Care [facility]) CM/SW Contact:    Janae Bridgeman, RN Phone Number: 06/19/2022, 10:24 AM    Transition of Care Department [facility]) has reviewed patient and no TOC needs have been identified at this time. We will continue to monitor patient advancement through interdisciplinary progression rounds. If new patient transition needs arise, please place a TOC consult.           Patient Goals and CMS Choice      Discharge Placement                         Discharge Plan and Services Additional resources added to the After Visit Summary for                                       Social Determinants of Health (SDOH) Interventions SDOH Screenings   Food Insecurity: Food Insecurity Present (06/16/2022)  Housing: Patient Declined (06/16/2022)  Transportation Needs: No Transportation Needs (06/16/2022)  Utilities: Not At Risk (06/16/2022)  Depression (PHQ2-9): High Risk (10/04/2021)  Tobacco Use: Low Risk  (06/16/2022)     Readmission Risk Interventions     No data to display

## 2022-06-19 NOTE — Discharge Summary (Signed)
Physician Discharge Summary   Patient: [name] MRN: [id number] DOB: [birth year]  Admit date:     06/16/2022  Discharge date: 06/19/22  Discharge Physician: Alberteen Sam   PCP: [place], [name], MD     Recommendations at discharge:  Follow up with Dr. Georgina Pillion Neurology as soon as able     Discharge Diagnoses: Principal Problem:   Myasthenia gravis with acute exacerbation (HCC) Active Problems:   Type 2 diabetes mellitus with other specified complication (HCC)   Anxiety disorder, unspecified   Hypertension, essential, benign   Hepatitis B   Hyponatremia     Hospital Course: [name] is a 67 y.o. M with myasthenia gravis, malignant thymoma s/p radX/resection, DM and HTN who presented with worsening symptoms head drop, right arm weakness and difficulty swallowing.  Had recently been to his Neurologist, who increased his prednisone for these symptoms, but they progressed, so he came to the ER.       * Myasthenia gravis with acute exacerbation [facility]) Patient was admitted and neurology were consulted.  He was started on IVIG.  Neurology noted disrepancy between NIF (which remained around -30) and VC which was more normal.    Patient reported his symptoms were subjectively better however and his HCO3 was only 27-29, not implying any chronic hypercarbia.  On night of discharge, patient did feel that his breathing was not normal, and so he was observed one additional night on continuous pulse ox, and his O2 remained normal, respirations 16-18 per minute.  In the morning, he appeared well and requested discharge.              The [facility] Controlled Substances Registry was reviewed for this patient prior to discharge.  Consultants: Neurology Procedures performed: None  Disposition: Home Diet recommendation:  Regular diet  DISCHARGE MEDICATION: Allergies as of 06/19/2022       Reactions   Azithromycin Other (See Comments)   REACTION: exac of  his mg   Beta Adrenergic Blockers Other (See Comments)   REACTION: exac of mg        Medication List     TAKE these medications    ampicillin 500 MG capsule Commonly known as: PRINCIPEN Take by mouth.   calcium carbonate 600 MG Tabs tablet Commonly known as: OS-CAL Take 600 mg by mouth 3 (three) times daily with meals.   colesevelam 625 MG tablet Commonly known as: WELCHOL Take 3 tablets (1,875 mg total) by mouth daily.   desonide 0.05 % gel Commonly known as: DESONATE Apply 1 application topically 2 (two) times daily as needed.   entecavir 1 MG tablet Commonly known as: BARACLUDE Take 1 tablet (1 mg total) by mouth daily.   fluocinonide ointment 0.05 % Commonly known as: LIDEX APPLY TO AFFECTED AREA(S) OF THE SKIN daily AS NEEDED. By dermatologist.   FLUoxetine 20 MG capsule Commonly known as: PROZAC TAKE ONE CAPSULE BY MOUTH DAILY   ibuprofen 200 MG tablet Commonly known as: ADVIL Take 400 mg by mouth every 8 (eight) hours as needed for headache or mild pain.   metFORMIN 500 MG tablet Commonly known as: GLUCOPHAGE Take 2 tablets (1,000 mg total) by mouth 2 (two) times daily.   olmesartan 20 MG tablet Commonly known as: BENICAR TAKE ONE TABLET BY MOUTH DAILY *DISCONTINUE DILITIAZEM*   omeprazole 20 MG capsule Commonly known as: PRILOSEC TAKE 1 CAPSULE BY MOUTH DAILY   OneTouch Ultra test strip Generic drug: glucose blood USE TO TEST BLOOD SUGAR 3 TIMES  DAILY   onetouch ultrasoft lancets USE AS INSTRUCTED TO CHECK BLOOD SUGARS THREE TIMES A DAY   pimecrolimus 1 % cream Commonly known as: ELIDEL Apply 1 application topically 2 (two) times daily as needed.   predniSONE 5 MG tablet Commonly known as: DELTASONE Take 10 mg by mouth as directed.   pyridostigmine 60 MG tablet Commonly known as: MESTINON Take 60 mg by mouth 3 (three) times daily.   repaglinide 0.5 MG tablet Commonly known as: PRANDIN Take 1 tablet (0.5 mg total) by mouth daily  before supper. What changed:  when to take this reasons to take this   sitaGLIPtin 100 MG tablet Commonly known as: Januvia Take 1 tablet (100 mg total) by mouth daily.   Vitamin D 50 MCG (2000 UT) tablet Take 2,000 Units by mouth daily.   zolpidem 10 MG tablet Commonly known as: AMBIEN TAKE 1/2 TO 1 TABLET (0.5-1) BY MOUTH EVERY NIGHT AT BEDTIME AS NEEDED        Follow-up Information     Evangeline Gula, MD. Call.   Specialties: Psychiatry, Neurology Contact information: [address] [place] [address] [phone]                 Discharge Instructions     Discharge instructions   Complete by: As directed    **IMPORTANT DISCHARGE INSTRUCTIONS**   From Dr. Maryfrances Bunnell: You were admitted to the hospital for a flare of your myasthenia gravis. Here, you were treated with IVIG.  You should resume all your home medicines as you were taking them before admission and call Dr. Rosanne Ashing office for further instructions (let her know you were in the hospital for 3 days of IVIG and ask her how to take the prednisone and when to come back to see her office)   Increase activity slowly   Complete by: As directed        Discharge Exam: Filed Weights   06/16/22 1103 06/16/22 1614  Weight: 53.1 kg 53 kg    General: Alert and awake, ambulating in room, appears comfortable  Cardiovascular: RRR no murmurs.   Respiratory: Respiratory rate appears normal, speech sounds normal.  He has decreased inspiratory effort, but lungs sound clear Neuro: There is baseline right ptsosis, voice normal, speech fluent, neck strength appears normal   Condition at discharge: good  The results of significant diagnostics from this hospitalization (including imaging, microbiology, ancillary and laboratory) are listed below for reference.   Imaging Studies: DG Chest 2 View  Result Date: 06/16/2022 CLINICAL DATA:  shortness of breath EXAM: CHEST - 2 VIEW COMPARISON:   Chest XR, 12/02/2007.  CT chest, 223 2012. FINDINGS: Cardiomediastinal silhouette is within normal limits. Surgical clips at the superior mediastinum. Lungs are well inflated. No focal consolidation or mass. No pleural effusion or pneumothorax. Median sternotomy. No acute displaced fracture. IMPRESSION: Normal chest. Electronically Signed   By: Roanna Banning M.D.   On: 06/16/2022 08:25    Microbiology: No results found for this or any previous visit.  Labs: CBC: Recent Labs  Lab 06/16/22 0722  WBC 9.0  NEUTROABS 4.6  HGB 14.3  HCT 41.9  MCV 83.8  PLT 288    Basic Metabolic Panel: Recent Labs  Lab 06/16/22 0722 06/17/22 0435  NA 130* 128*  K 3.5 3.6  CL 92* 94*  CO2 27 27  GLUCOSE 104* 90  BUN 7* 7*  CREATININE 1.02 0.89  CALCIUM 9.1 8.6*    Liver Function Tests: Recent  Labs  Lab 06/16/22 0722  AST 21  ALT 15  ALKPHOS 35*  BILITOT 0.6  PROT 7.0  ALBUMIN 4.1    CBG: Recent Labs  Lab 06/18/22 0814 06/18/22 1157 06/18/22 1824 06/18/22 2106 06/19/22 0837  GLUCAP 121* 109* 131* 115* 91     Discharge time spent: approximately 25 minutes spent on discharge counseling, evaluation of patient on day of discharge, and coordination of discharge planning with nursing, social work, pharmacy and case management  Signed: Alberteen Sam, MD [facility] 06/19/2022

## 2022-06-20 ENCOUNTER — Telehealth: Payer: Self-pay

## 2022-06-20 NOTE — Progress Notes (Unsigned)
HPI: [name] is a 67 y.o. male with PMHx significant for anxiety,depression, DM II,myasthenia gravis,HTN,sinus tachycardia,and chronic hep B here today with his wife to follow on recent hospital visit. Hospitalized from 06/16/22 to 06/19/22.  TOC call on 06/20/22. Admitted with a diagnosis of myasthenia gravis with acute exacerbation. Neurology consultation was done during hospitalization.  Presented to the ER with worsening symptoms head drop, right arm weakness and difficulty swallowing, he was started on IVIG.  Myasthenia gravis: Symptoms started in 06/2021.   Status post thymectomy with malignant thymoma pathology, status post radiation. He follows with neurologist at [facility], Dr. Kathyrn Sandoval, last seen on 06/08/2022. He is currently on prednisone 10 mg daily.  He reports ongoing symptoms including difficulty raising his head and arms, especially in the mornings, sensation of heaviness, difficulty breathing, slowed speech and right eye drooping. These symptoms were present before his ER visit but have slightly improved after IVIG; he can now chew and swallow more easily. He feels like symptoms are gradually getting worse since hospital discharge, states that symptoms improve after taking Prednisone; However, he feels Prednisone only helps for a few hours.  Last BMP showed mild hyponatremia. His wife has not noted MS changes. He reports having normal bowel movements.  Lab Results  Component Value Date   CREATININE 0.89 06/17/2022   BUN 7 (L) 06/17/2022   NA 128 (L) 06/17/2022   K 3.6 06/17/2022   CL 94 (L) 06/17/2022   CO2 27 06/17/2022   Lab Results  Component Value Date   WBC 9.0 06/16/2022   HGB 14.3 06/16/2022   HCT 41.9 06/16/2022   MCV 83.8 06/16/2022   PLT 288 06/16/2022   Chronic hepatitis B on Entecavir 1 mg daily. He follows with ID. Last liver US I see done in 2020.   Lab Results  Component Value Date   ALT 15 06/16/2022   AST 21 06/16/2022   ALKPHOS 35 (L)  06/16/2022   BILITOT 0.6 06/16/2022  HTN on Benicar 20 mg daily. He reports "good" BP readings at home, he does not remember readings. He denies CP, palpitation, orthopnea, PND, or edema.  Anxiety and depression: Currently he is on fluoxetine 20 mg daily. States that he received Xanax 0.5 mg while in the hospital. He has been referred to psychiatrist for PTSD. Insomnia: He is on Ambien 10 mg daily at bedtime as needed. Sleeping better, 4-5 hours.     06/21/2022    9:22 AM 10/04/2021    9:02 AM 03/04/2021    8:13 AM 01/07/2021   11:36 AM 12/27/2019    9:02 PM  Depression screen PHQ 2/9  Decreased Interest 3 2 2 1  0  Down, Depressed, Hopeless 3 2 2 1  0  PHQ - 2 Score 6 4 4 2  0  Altered sleeping 3 2 2 1  0  Tired, decreased energy 3 2 2 1  0  Change in appetite 3 1 1 1  0  Feeling bad or failure about yourself  3 2 2 1  0  Trouble concentrating 3 3 3 3 1   Moving slowly or fidgety/restless 3 1 2  0 0  Suicidal thoughts 0 1 1 0 0  PHQ-9 Score 24 16 17 9 1   Difficult doing work/chores Extremely dIfficult Somewhat difficult Very difficult  Somewhat difficult      06/21/2022    9:22 AM 12/27/2019    9:03 PM  GAD 7 : Generalized Anxiety Score  Nervous, Anxious, on Edge 1 1  Control/stop worrying 0 1  Worry too much - different things 0 1  Trouble relaxing 1 1  Restless 2 1  Easily annoyed or irritable 1 1  Afraid - awful might happen 1 1  Total GAD 7 Score 6 7  Anxiety Difficulty Extremely difficult Somewhat difficult   Review of Systems  Constitutional:  Positive for activity change, appetite change and fatigue. Negative for fever.  HENT:  Negative for nosebleeds and sore throat.   Respiratory:  Negative for cough and wheezing.   Gastrointestinal:  Negative for abdominal pain, nausea and vomiting.  Endocrine: Negative for cold intolerance and heat intolerance.  Genitourinary:  Negative for decreased urine volume, dysuria and hematuria.  Neurological:  Negative for syncope.   Psychiatric/Behavioral:  Negative for confusion and hallucinations. The patient is nervous/anxious.   See other pertinent positives and negatives in HPI.  Current Outpatient Medications on File Prior to Visit  Medication Sig Dispense Refill   ampicillin (PRINCIPEN) 500 MG capsule Take by mouth.     calcium carbonate (OS-CAL) 600 MG TABS Take 600 mg by mouth 3 (three) times daily with meals.     Cholecalciferol (VITAMIN D) 2000 UNITS tablet Take 2,000 Units by mouth daily.     colesevelam (WELCHOL) 625 MG tablet Take 3 tablets (1,875 mg total) by mouth daily. 270 tablet 0   desonide (DESONATE) 0.05 % gel Apply 1 application topically 2 (two) times daily as needed.      entecavir (BARACLUDE) 1 MG tablet Take 1 tablet (1 mg total) by mouth daily. 30 tablet 6   fluocinonide ointment (LIDEX) 0.05 % APPLY TO AFFECTED AREA(S) OF THE SKIN daily AS NEEDED. By dermatologist. 60 g 1   FLUoxetine (PROZAC) 20 MG capsule TAKE ONE CAPSULE BY MOUTH DAILY 90 capsule 3   glucose blood (ONETOUCH ULTRA) test strip USE TO TEST BLOOD SUGAR 3 TIMES DAILY 100 strip 11   ibuprofen (ADVIL) 200 MG tablet Take 400 mg by mouth every 8 (eight) hours as needed for headache or mild pain.     Lancets (ONETOUCH ULTRASOFT) lancets USE AS INSTRUCTED TO CHECK BLOOD SUGARS THREE TIMES A DAY 200 each 3   metFORMIN (GLUCOPHAGE) 500 MG tablet Take 2 tablets (1,000 mg total) by mouth 2 (two) times daily. 360 tablet 3   olmesartan (BENICAR) 20 MG tablet TAKE ONE TABLET BY MOUTH DAILY *DISCONTINUE DILITIAZEM* 90 tablet 1   omeprazole (PRILOSEC) 20 MG capsule TAKE 1 CAPSULE BY MOUTH DAILY 90 capsule 1   pimecrolimus (ELIDEL) 1 % cream Apply 1 application topically 2 (two) times daily as needed.      predniSONE (DELTASONE) 5 MG tablet Take 10 mg by mouth as directed.     pyridostigmine (MESTINON) 60 MG tablet Take 60 mg by mouth 3 (three) times daily.     repaglinide (PRANDIN) 0.5 MG tablet Take 1 tablet (0.5 mg total) by mouth daily  before supper. (Patient taking differently: Take 0.5 mg by mouth daily as needed (when BG > 200).) 90 tablet 3   sitaGLIPtin (JANUVIA) 100 MG tablet Take 1 tablet (100 mg total) by mouth daily. 90 tablet 3   zolpidem (AMBIEN) 10 MG tablet TAKE 1/2 TO 1 TABLET (0.5-1) BY MOUTH EVERY NIGHT AT BEDTIME AS NEEDED 30 tablet 2   No current facility-administered medications on file prior to visit.   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)    Type II or unspecified type diabetes mellitus without mention of complication, not stated as uncontrolled    Viral hepatitis B without mention of hepatic coma, chronic, without mention of hepatitis delta    Allergies  Allergen Reactions   Azithromycin Other (See Comments)    REACTION: exac of his mg   Beta Adrenergic Blockers Other (See Comments)    REACTION: exac of mg   Social History   Socioeconomic History   Marital status: Married    Spouse name: Not on file   Number of children: Not on file   Years of education: Not on file   Highest education level: Not on file  Occupational History   Not on file  Tobacco Use   Smoking status: Never   Smokeless tobacco: Never  Substance and Sexual Activity   Alcohol use: No   Drug use: No   Sexual activity: Not on file  Other Topics Concern   Not on file  Social History Narrative   Not on file   Social Determinants of Health   Financial Resource Strain: Not on file  Food Insecurity: Food Insecurity Present (06/16/2022)   Hunger Vital Sign    Worried About Running Out of Food in the Last Year: Sometimes true    Ran Out of Food in the Last Year: Never true  Transportation Needs: No Transportation Needs (06/20/2022)    PRAPARE - Administrator, Civil Service (Medical): No    Lack of Transportation (Non-Medical): No  Physical Activity: Not on file  Stress: Not on file  Social Connections: Not on file   Vitals:   06/21/22 0831  BP: (!) 128/90  Pulse: 100  Resp: 16  Temp: 98.6 F (37 C)  SpO2: 99%   Body mass index is 18.87 kg/m.  Physical Exam Vitals and nursing note reviewed.  Constitutional:      General: He is not in acute distress.    Appearance: He is well-developed.  HENT:     Head: Normocephalic and atraumatic.     Mouth/Throat:     Mouth: Mucous membranes are moist.  Eyes:     Conjunctiva/sclera: Conjunctivae normal.     Comments: Right palpebral ptosis.  Cardiovascular:     Rate and Rhythm: Normal rate and regular rhythm.     Pulses:          Dorsalis pedis pulses are 2+ on the right side and 2+ on the left side.     Heart sounds: No murmur heard. Pulmonary:     Effort: Pulmonary effort is normal. No respiratory distress.     Breath sounds: Normal breath sounds.  Abdominal:     Palpations: Abdomen is soft. There is no hepatomegaly or mass.     Tenderness: There is no abdominal tenderness.  Skin:    General: Skin is warm.     Findings: No erythema or rash.  Neurological:     General: No focal deficit present.     Mental Status: He is alert and oriented to person, place, and time.     Cranial Nerves: No cranial nerve deficit.     Comments: Stable gait,not assisted.  Psychiatric:        Mood and Affect: Mood is anxious.   ASSESSMENT AND PLAN:  Mr. Daqwon was seen  today for hospitalization follow-up.  Diagnoses and all orders for this visit:  SOB (shortness of breath) Not in respiratory distress, lung auscultation negative, and pulse O2 99%. He reports problem was slowly getting worse after hospital discharge. Recommend going to the ER now but he declined. He was clearly instructed about warning signs.  Hypertension, essential, benign Assessment &  Plan: Today DBP mildly elevated. He reports lower BP readings at home. Continue Benicar 20 mg daily and low salt diet. Continue monitoring BP regularly.  Orders: -     Basic metabolic panel; Future  Myasthenia gravis with acute exacerbation (HCC) Assessment & Plan: With right palpebral ptosis. During hospitalization he was treated with IVIG x 2.  Treatment was not changed during hospitalization, states that his neurologist recently increased prednisone dose, 06/08/2022.   Currently he is on prednisone 5 mg 2 tablets daily and pyridostigmine 60 mg 3 times daily. He reports that most of the symptoms improved after taking prednisone, suggest to take 5 mg in the morning and 5 mg around noon instead taking both tablets together, he is afraid of making changes. Pending follow-up appointment with Dr. Kathyrn Sandoval.  Chronic hepatitis B virus infection (HCC) Assessment & Plan: Currently on entecavir 1 mg daily. Follows with ID and has liver US regularly, I do not have copy of the most recent imaging.  Generalized anxiety disorder Assessment & Plan: History of PTSD. Recommend establishing with psychiatrist, he will also benefit from CBT.  Referral placed. Continue fluoxetine 20 mg daily. I do not feel comfortable adding Xanax at this time, we discussed some side effects.  Orders: -     Ambulatory referral to Psychiatry  Severe episode of recurrent major depressive disorder, without psychotic features (HCC) Assessment & Plan: Not well-controlled and getting worse. Continue fluoxetine 20 mg daily. Referral to psychiatrist placed, contact information given, so he can call and arrange appointment. Instructed about warning signs.  Hyponatremia Assessment & Plan: Mild-moderate. Some of his medications can aggravate problem. Fluid restriction reco mended for now. Further recommendations according to BMP result. Instructed about warning signs.  I spent a total of 77 minutes in both face to face  and non face to face activities for this visit on the date of this encounter. During this time history was obtained and documented, examination was performed, prior labs/imaging reviewed, and assessment/plan discussed.  Return in about 2 months (around 08/21/2022) for chronic problems.  Jahlen Bollman G. [place], MD  [facility]. [place] office.

## 2022-06-20 NOTE — Transitions of Care (Post Inpatient/ED Visit) (Signed)
06/20/2022  Name: [name] MRN: [id number] DOB: [birth year]  Today's TOC FU Call Status: Today's TOC FU Call Status:: Successful TOC FU Call Competed TOC FU Call Complete Date: 06/20/22  Transition Care Management Follow-up Telephone Call Date of Discharge: 06/19/22 Discharge Facility: [facility]) Type of Discharge: Inpatient Admission Primary Inpatient Discharge Diagnosis:: "myasthenia gravis w/ acute exacerbation" How have you been since you were released from the hospital?: Same (Pt reports he is still having some "flare up sxs-eye still droopy w/ blurred vision at times, head feels heavy at times." He voices that his chewing/swallowing is better but only able to eat small portions.) Any questions or concerns?: Yes Patient Questions/Concerns:: Pt wanting to discuss with MD if she would prescribe Xanax for him to take as needed-states he was getting it in the hospital-helped him rest and helped when he had SOB Patient Questions/Concerns Addressed: Notified Provider of Patient Questions/Concerns, Other: (Pt requested appt with PCP as soon as possible-care guide assisted with making appt wth MD for 06/21/22-pt will discuss sxs and wanting to take Xanax with during appt. He will also call and make neuro f/u appt as advised)  Items Reviewed: Did you receive and understand the discharge instructions provided?: Yes Medications obtained,verified, and reconciled?: Yes (Medications Reviewed) Any new allergies since your discharge?: No Dietary orders reviewed?: Yes Type of Diet Ordered:: low salt/heart healthy/carb modified Do you have support at home?: Yes People in Home: spouse Name of Support/Comfort Primary Source: [name]  Medications Reviewed Today: Medications Reviewed Today     Reviewed by Charlyn Minerva, RN (Registered Nurse) on 06/20/22 at 1525  Med List Status: <None>   Medication Order Taking? Sig Documenting Provider Last Dose Status Informant  ampicillin  (PRINCIPEN) 500 MG capsule [id number] Yes Take by mouth. [provider] Taking Active   calcium carbonate (OS-CAL) 600 MG TABS [id number] Yes Take 600 mg by mouth 3 (three) times daily with meals. [provider] Taking Active Self  Cholecalciferol (VITAMIN D) 2000 UNITS tablet [id number] Yes Take 2,000 Units by mouth daily. [provider] Taking Active Self  colesevelam [facility]) 625 MG tablet [id number] Yes Take 3 tablets (1,875 mg total) by mouth daily. Shamleffer, Konrad Dolores, MD Taking Active   desonide (DESONATE) 0.05 % gel [id number] Yes Apply 1 application topically 2 (two) times daily as needed.  [provider] Taking Active Self  entecavir (BARACLUDE) 1 MG tablet [id number] Yes Take 1 tablet (1 mg total) by mouth daily. Matthew Dare, MD Taking Active Self  fluocinonide ointment (LIDEX) 0.05 % [id number] Yes APPLY TO AFFECTED AREA(S) OF THE SKIN daily AS NEEDED. By dermatologist. Sandoval, [name], MD Taking Active   FLUoxetine (PROZAC) 20 MG capsule [id number] Yes TAKE ONE CAPSULE BY MOUTH DAILY Sandoval, [name], MD Taking Active   glucose blood [facility] ULTRA) test strip [id number] Yes USE TO TEST BLOOD SUGAR 3 TIMES DAILY Matthew Belling, MD Taking Active   ibuprofen (ADVIL) 200 MG tablet [id number] Yes Take 400 mg by mouth every 8 (eight) hours as needed for headache or mild pain. [provider] Taking Active   Lancets [name] ULTRASOFT) lancets [id number] Yes USE AS INSTRUCTED TO CHECK BLOOD SUGARS THREE TIMES A Candida Peeling, MD Taking Active   metFORMIN (GLUCOPHAGE) 500 MG tablet [id number] Yes Take 2 tablets (1,000 mg total) by mouth 2 (two) times daily. Shamleffer, Konrad Dolores, MD Taking Active   olmesartan (BENICAR) 20 MG tablet [id number] Yes TAKE ONE TABLET BY MOUTH DAILY *DISCONTINUE DILITIAZEM* Shamleffer,  Konrad Dolores, MD Taking Active   omeprazole (PRILOSEC) 20 MG capsule [id number] Yes TAKE 1 CAPSULE BY MOUTH DAILY Sandoval, [name],  MD Taking Active   pimecrolimus (ELIDEL) 1 % cream [id number] Yes Apply 1 application topically 2 (two) times daily as needed.  [provider] Taking Active Self  predniSONE (DELTASONE) 5 MG tablet [id number] Yes Take 10 mg by mouth as directed. [provider] Taking Active   pyridostigmine (MESTINON) 60 MG tablet [id number] Yes Take 60 mg by mouth 3 (three) times daily. [provider] Taking Active   repaglinide (PRANDIN) 0.5 MG tablet [id number] Yes Take 1 tablet (0.5 mg total) by mouth daily before supper.  Patient taking differently: Take 0.5 mg by mouth daily as needed (when BG > 200).   Shamleffer, Konrad Dolores, MD Taking Active   sitaGLIPtin (JANUVIA) 100 MG tablet [id number] Yes Take 1 tablet (100 mg total) by mouth daily. Shamleffer, Konrad Dolores, MD Taking Active   zolpidem (AMBIEN) 10 MG tablet [id number] Yes TAKE 1/2 TO 1 TABLET (0.5-1) BY MOUTH EVERY NIGHT AT BEDTIME AS NEEDED Sandoval, [name], MD Taking Active             Home Care and Equipment/Supplies: Were Home Health Services Ordered?: NA Any new equipment or medical supplies ordered?: NA  Functional Questionnaire: Do you need assistance with bathing/showering or dressing?: Yes Do you need assistance with meal preparation?: Yes Do you need assistance with eating?: No Do you have difficulty maintaining continence: No Do you need assistance with getting out of bed/getting out of a chair/moving?: Yes Do you have difficulty managing or taking your medications?: Yes  Follow up appointments reviewed: PCP Follow-up appointment confirmed?: Yes Date of PCP follow-up appointment?: 06/21/22 Follow-up Provider: Dr. [place] Specialist [facility] Follow-up appointment confirmed?: No Reason Specialist Follow-Up Not Confirmed: Patient has Specialist Provider Number and will Call for Appointment (pt has neuro MD-[name] contact info and will call office today to make an appt) Do you need transportation  to your follow-up appointment?: No (pt confirms spouse able to drive him to appts) Do you understand care options if your condition(s) worsen?: Yes-patient verbalized understanding  SDOH Interventions Today    Flowsheet Row Most Recent Value  SDOH Interventions   Transportation Interventions Intervention Not Indicated      TOC Interventions Today    Flowsheet Row Most Recent Value  TOC Interventions   TOC Interventions Discussed/Reviewed TOC Interventions Discussed, Arranged PCP follow up within 7 days/Care Guide scheduled, Contacted provider for patient needs      Interventions Today    Flowsheet Row Most Recent Value  General Interventions   General Interventions Discussed/Reviewed General Interventions Discussed, Doctor Visits  Doctor Visits Discussed/Reviewed Specialist, PCP, Doctor Visits Discussed  PCP/Specialist Visits Compliance with follow-up visit  Education Interventions   Education Provided Provided Education  Provided Verbal Education On Nutrition, Medication, When to see the doctor, Other  Nutrition Interventions   Nutrition Discussed/Reviewed Nutrition Discussed  Pharmacy Interventions   Pharmacy Dicussed/Reviewed Pharmacy Topics Discussed, Medications and their functions  Safety Interventions   Safety Discussed/Reviewed Safety Discussed, Fall Risk, Home Safety       Alessandra Grout [facility] Care Management Care Management Community Coordinator Direct Phone: [phone] Toll Free: [phone] Fax: [phone]

## 2022-06-21 ENCOUNTER — Emergency Department (HOSPITAL_COMMUNITY): Payer: Medicare Other

## 2022-06-21 ENCOUNTER — Encounter (HOSPITAL_COMMUNITY): Payer: Self-pay

## 2022-06-21 ENCOUNTER — Inpatient Hospital Stay (HOSPITAL_COMMUNITY): Payer: Medicare Other

## 2022-06-21 ENCOUNTER — Other Ambulatory Visit: Payer: Self-pay

## 2022-06-21 ENCOUNTER — Encounter: Payer: Self-pay | Admitting: Family Medicine

## 2022-06-21 ENCOUNTER — Ambulatory Visit (INDEPENDENT_AMBULATORY_CARE_PROVIDER_SITE_OTHER): Payer: Medicare Other | Admitting: Family Medicine

## 2022-06-21 ENCOUNTER — Telehealth: Payer: Self-pay | Admitting: Family Medicine

## 2022-06-21 ENCOUNTER — Inpatient Hospital Stay (HOSPITAL_COMMUNITY)
Admission: EM | Admit: 2022-06-21 | Discharge: 2022-07-07 | DRG: 208 | Disposition: A | Discharge status: Rehabilitation Facility | Payer: Medicare Other | Attending: Internal Medicine | Admitting: Internal Medicine

## 2022-06-21 VITALS — BP 128/90 | HR 100 | Temp 98.6°F | Resp 16 | Ht 65.0 in | Wt 113.4 lb

## 2022-06-21 DIAGNOSIS — R1310 Dysphagia, unspecified: Secondary | ICD-10-CM | POA: Diagnosis not present

## 2022-06-21 DIAGNOSIS — Z888 Allergy status to other drugs, medicaments and biological substances status: Secondary | ICD-10-CM

## 2022-06-21 DIAGNOSIS — G7001 Myasthenia gravis with (acute) exacerbation: Principal | ICD-10-CM | POA: Diagnosis present

## 2022-06-21 DIAGNOSIS — Z0389 Encounter for observation for other suspected diseases and conditions ruled out: Secondary | ICD-10-CM | POA: Diagnosis not present

## 2022-06-21 DIAGNOSIS — Z681 Body mass index (BMI) 19 or less, adult: Secondary | ICD-10-CM

## 2022-06-21 DIAGNOSIS — E43 Unspecified severe protein-calorie malnutrition: Secondary | ICD-10-CM | POA: Insufficient documentation

## 2022-06-21 DIAGNOSIS — E1143 Type 2 diabetes mellitus with diabetic autonomic (poly)neuropathy: Secondary | ICD-10-CM | POA: Diagnosis present

## 2022-06-21 DIAGNOSIS — E119 Type 2 diabetes mellitus without complications: Secondary | ICD-10-CM | POA: Diagnosis not present

## 2022-06-21 DIAGNOSIS — I471 Supraventricular tachycardia, unspecified: Secondary | ICD-10-CM | POA: Diagnosis not present

## 2022-06-21 DIAGNOSIS — E876 Hypokalemia: Secondary | ICD-10-CM | POA: Diagnosis present

## 2022-06-21 DIAGNOSIS — R64 Cachexia: Secondary | ICD-10-CM | POA: Diagnosis not present

## 2022-06-21 DIAGNOSIS — M7989 Other specified soft tissue disorders: Secondary | ICD-10-CM | POA: Diagnosis not present

## 2022-06-21 DIAGNOSIS — Z1152 Encounter for screening for COVID-19: Secondary | ICD-10-CM

## 2022-06-21 DIAGNOSIS — G7000 Myasthenia gravis without (acute) exacerbation: Secondary | ICD-10-CM | POA: Diagnosis not present

## 2022-06-21 DIAGNOSIS — E871 Hypo-osmolality and hyponatremia: Secondary | ICD-10-CM | POA: Diagnosis not present

## 2022-06-21 DIAGNOSIS — D649 Anemia, unspecified: Secondary | ICD-10-CM | POA: Diagnosis not present

## 2022-06-21 DIAGNOSIS — J9600 Acute respiratory failure, unspecified whether with hypoxia or hypercapnia: Secondary | ICD-10-CM

## 2022-06-21 DIAGNOSIS — Z66 Do not resuscitate: Secondary | ICD-10-CM | POA: Diagnosis not present

## 2022-06-21 DIAGNOSIS — I7121 Aneurysm of the ascending aorta, without rupture: Secondary | ICD-10-CM | POA: Diagnosis not present

## 2022-06-21 DIAGNOSIS — T380X5A Adverse effect of glucocorticoids and synthetic analogues, initial encounter: Secondary | ICD-10-CM | POA: Diagnosis present

## 2022-06-21 DIAGNOSIS — Z923 Personal history of irradiation: Secondary | ICD-10-CM

## 2022-06-21 DIAGNOSIS — J9601 Acute respiratory failure with hypoxia: Secondary | ICD-10-CM | POA: Diagnosis not present

## 2022-06-21 DIAGNOSIS — F064 Anxiety disorder due to known physiological condition: Secondary | ICD-10-CM | POA: Diagnosis present

## 2022-06-21 DIAGNOSIS — G4700 Insomnia, unspecified: Secondary | ICD-10-CM | POA: Diagnosis not present

## 2022-06-21 DIAGNOSIS — I10 Essential (primary) hypertension: Secondary | ICD-10-CM | POA: Diagnosis not present

## 2022-06-21 DIAGNOSIS — K219 Gastro-esophageal reflux disease without esophagitis: Secondary | ICD-10-CM | POA: Diagnosis not present

## 2022-06-21 DIAGNOSIS — J988 Other specified respiratory disorders: Secondary | ICD-10-CM | POA: Diagnosis not present

## 2022-06-21 DIAGNOSIS — Z8719 Personal history of other diseases of the digestive system: Secondary | ICD-10-CM | POA: Diagnosis not present

## 2022-06-21 DIAGNOSIS — Z8619 Personal history of other infectious and parasitic diseases: Secondary | ICD-10-CM

## 2022-06-21 DIAGNOSIS — E785 Hyperlipidemia, unspecified: Secondary | ICD-10-CM | POA: Diagnosis present

## 2022-06-21 DIAGNOSIS — E1165 Type 2 diabetes mellitus with hyperglycemia: Secondary | ICD-10-CM | POA: Diagnosis not present

## 2022-06-21 DIAGNOSIS — I77819 Aortic ectasia, unspecified site: Secondary | ICD-10-CM | POA: Diagnosis not present

## 2022-06-21 DIAGNOSIS — J9811 Atelectasis: Secondary | ICD-10-CM | POA: Diagnosis present

## 2022-06-21 DIAGNOSIS — F40240 Claustrophobia: Secondary | ICD-10-CM | POA: Diagnosis present

## 2022-06-21 DIAGNOSIS — Z4659 Encounter for fitting and adjustment of other gastrointestinal appliance and device: Secondary | ICD-10-CM | POA: Diagnosis not present

## 2022-06-21 DIAGNOSIS — I959 Hypotension, unspecified: Secondary | ICD-10-CM | POA: Diagnosis not present

## 2022-06-21 DIAGNOSIS — Z85238 Personal history of other malignant neoplasm of thymus: Secondary | ICD-10-CM

## 2022-06-21 DIAGNOSIS — R0602 Shortness of breath: Secondary | ICD-10-CM

## 2022-06-21 DIAGNOSIS — Z79899 Other long term (current) drug therapy: Secondary | ICD-10-CM

## 2022-06-21 DIAGNOSIS — Z881 Allergy status to other antibiotic agents status: Secondary | ICD-10-CM | POA: Diagnosis not present

## 2022-06-21 DIAGNOSIS — Z4682 Encounter for fitting and adjustment of non-vascular catheter: Secondary | ICD-10-CM | POA: Diagnosis not present

## 2022-06-21 DIAGNOSIS — R509 Fever, unspecified: Secondary | ICD-10-CM | POA: Diagnosis not present

## 2022-06-21 DIAGNOSIS — Z9911 Dependence on respirator [ventilator] status: Secondary | ICD-10-CM | POA: Diagnosis not present

## 2022-06-21 DIAGNOSIS — F411 Generalized anxiety disorder: Secondary | ICD-10-CM | POA: Diagnosis not present

## 2022-06-21 DIAGNOSIS — F419 Anxiety disorder, unspecified: Secondary | ICD-10-CM | POA: Diagnosis not present

## 2022-06-21 DIAGNOSIS — J9602 Acute respiratory failure with hypercapnia: Secondary | ICD-10-CM | POA: Diagnosis present

## 2022-06-21 DIAGNOSIS — B181 Chronic viral hepatitis B without delta-agent: Secondary | ICD-10-CM | POA: Diagnosis not present

## 2022-06-21 DIAGNOSIS — J90 Pleural effusion, not elsewhere classified: Secondary | ICD-10-CM | POA: Diagnosis not present

## 2022-06-21 DIAGNOSIS — F332 Major depressive disorder, recurrent severe without psychotic features: Secondary | ICD-10-CM

## 2022-06-21 DIAGNOSIS — Z7952 Long term (current) use of systemic steroids: Secondary | ICD-10-CM

## 2022-06-21 DIAGNOSIS — R262 Difficulty in walking, not elsewhere classified: Secondary | ICD-10-CM | POA: Diagnosis not present

## 2022-06-21 DIAGNOSIS — Z794 Long term (current) use of insulin: Secondary | ICD-10-CM | POA: Diagnosis not present

## 2022-06-21 DIAGNOSIS — R1312 Dysphagia, oropharyngeal phase: Secondary | ICD-10-CM | POA: Diagnosis not present

## 2022-06-21 DIAGNOSIS — R269 Unspecified abnormalities of gait and mobility: Secondary | ICD-10-CM | POA: Diagnosis not present

## 2022-06-21 DIAGNOSIS — R0603 Acute respiratory distress: Secondary | ICD-10-CM | POA: Diagnosis not present

## 2022-06-21 DIAGNOSIS — F32A Depression, unspecified: Secondary | ICD-10-CM | POA: Diagnosis not present

## 2022-06-21 DIAGNOSIS — Z7984 Long term (current) use of oral hypoglycemic drugs: Secondary | ICD-10-CM

## 2022-06-21 DIAGNOSIS — R000 Tachycardia, unspecified: Secondary | ICD-10-CM | POA: Diagnosis not present

## 2022-06-21 DIAGNOSIS — B1910 Unspecified viral hepatitis B without hepatic coma: Secondary | ICD-10-CM | POA: Diagnosis not present

## 2022-06-21 DIAGNOSIS — R29810 Facial weakness: Secondary | ICD-10-CM | POA: Diagnosis not present

## 2022-06-21 DIAGNOSIS — F4310 Post-traumatic stress disorder, unspecified: Secondary | ICD-10-CM | POA: Diagnosis present

## 2022-06-21 HISTORY — DX: Myasthenia gravis with (acute) exacerbation: G70.01

## 2022-06-21 HISTORY — DX: Other specified dermatitis: L30.8

## 2022-06-21 LAB — BASIC METABOLIC PANEL
Anion gap: 12 (ref 5–15)
BUN: 9 mg/dL (ref 6–23)
BUN: 9 mg/dL (ref 8–23)
CO2: 29 mEq/L (ref 19–32)
CO2: 25 mmol/L (ref 22–32)
Calcium: 9.7 mg/dL (ref 8.4–10.5)
Calcium: 9.7 mg/dL (ref 8.9–10.3)
Chloride: 94 mEq/L — ABNORMAL LOW (ref 96–112)
Chloride: 92 mmol/L — ABNORMAL LOW (ref 98–111)
Creatinine, Ser: 0.96 mg/dL (ref 0.40–1.50)
Creatinine, Ser: 1 mg/dL (ref 0.61–1.24)
GFR, Estimated: >60 mL/min (ref >60)
GFR: 82.71 mL/min (ref >60.00)
Glucose, Bld: 150 mg/dL — ABNORMAL HIGH (ref 70–99)
Glucose, Bld: 126 mg/dL — ABNORMAL HIGH (ref 70–99)
Potassium: 4.1 mEq/L (ref 3.5–5.1)
Potassium: 3.9 mmol/L (ref 3.5–5.1)
Sodium: 128 mEq/L — ABNORMAL LOW (ref 135–145)
Sodium: 129 mmol/L — ABNORMAL LOW (ref 135–145)

## 2022-06-21 LAB — MRSA NEXT GEN BY PCR, NASAL: MRSA by PCR Next Gen: NOT DETECTED

## 2022-06-21 LAB — CBC WITH DIFFERENTIAL/PLATELET
Abs Immature Granulocytes: 0.01 10*3/uL (ref 0.00–0.07)
Basophils Absolute: 0 10*3/uL (ref 0.0–0.1)
Basophils Relative: 1 %
Eosinophils Absolute: 0 10*3/uL (ref 0.0–0.5)
Eosinophils Relative: 0 %
HCT: 40.4 % (ref 39.0–52.0)
Hemoglobin: 14.2 g/dL (ref 13.0–17.0)
Immature Granulocytes: 0 %
Lymphocytes Relative: 23 %
Lymphs Abs: 1.4 10*3/uL (ref 0.7–4.0)
MCH: 28.5 pg (ref 26.0–34.0)
MCHC: 35.1 g/dL (ref 30.0–36.0)
MCV: 81.1 fL (ref 80.0–100.0)
Monocytes Absolute: 0.8 10*3/uL (ref 0.1–1.0)
Monocytes Relative: 14 %
Neutro Abs: 3.8 10*3/uL (ref 1.7–7.7)
Neutrophils Relative %: 62 %
Platelets: 192 10*3/uL (ref 150–400)
RBC: 4.98 MIL/uL (ref 4.22–5.81)
RDW: 13.5 % (ref 11.5–15.5)
WBC: 6 10*3/uL (ref 4.0–10.5)
nRBC: 0 % (ref 0.0–0.2)

## 2022-06-21 LAB — I-STAT ARTERIAL BLOOD GAS, ED
Acid-base deficit: 4 mmol/L — ABNORMAL HIGH (ref 0.0–2.0)
Bicarbonate: 19.9 mmol/L — ABNORMAL LOW (ref 20.0–28.0)
Calcium, Ion: 1.15 mmol/L (ref 1.15–1.40)
HCT: 34 % — ABNORMAL LOW (ref 39.0–52.0)
Hemoglobin: 11.6 g/dL — ABNORMAL LOW (ref 13.0–17.0)
O2 Saturation: 100 %
Potassium: 3 mmol/L — ABNORMAL LOW (ref 3.5–5.1)
Sodium: 133 mmol/L — ABNORMAL LOW (ref 135–145)
TCO2: 21 mmol/L — ABNORMAL LOW (ref 22–32)
pCO2 arterial: 31.4 mmHg — ABNORMAL LOW (ref 32–48)
pH, Arterial: 7.41 (ref 7.35–7.45)
pO2, Arterial: 538 mmHg — ABNORMAL HIGH (ref 83–108)

## 2022-06-21 LAB — BRAIN NATRIURETIC PEPTIDE: B Natriuretic Peptide: 28.2 pg/mL (ref 0.0–100.0)

## 2022-06-21 LAB — TROPONIN I (HIGH SENSITIVITY): Troponin I (High Sensitivity): 10 ng/L (ref <18)

## 2022-06-21 LAB — GLUCOSE, CAPILLARY
Glucose-Capillary: 105 mg/dL — ABNORMAL HIGH (ref 70–99)
Glucose-Capillary: 188 mg/dL — ABNORMAL HIGH (ref 70–99)
Glucose-Capillary: 69 mg/dL — ABNORMAL LOW (ref 70–99)

## 2022-06-21 LAB — SARS CORONAVIRUS 2 BY RT PCR: SARS Coronavirus 2 by RT PCR: NEGATIVE

## 2022-06-21 MED ORDER — POLYETHYLENE GLYCOL 3350 17 G PO PACK
17.0000 g | PACK | Freq: Every day | ORAL | Status: DC
  Administered 2022-06-21 – 2022-06-22 (×2): 17 g
  Filled 2022-06-21 (×2): qty 1

## 2022-06-21 MED ORDER — DEXTROSE IN LACTATED RINGERS 5 % IV SOLN: INTRAVENOUS | Status: DC

## 2022-06-21 MED ORDER — ETOMIDATE 2 MG/ML IV SOLN
INTRAVENOUS | Status: AC | PRN
  Administered 2022-06-21: 20 mg via INTRAVENOUS

## 2022-06-21 MED ORDER — DOCUSATE SODIUM 50 MG/5ML PO LIQD: 100.0000 mg | Freq: Two times a day (BID) | ORAL | Status: DC | PRN

## 2022-06-21 MED ORDER — MIDAZOLAM HCL 2 MG/2ML IJ SOLN: 1.0000 mg | INTRAMUSCULAR | Status: DC | PRN

## 2022-06-21 MED ORDER — PANTOPRAZOLE SODIUM 40 MG IV SOLR
40.0000 mg | Freq: Every day | INTRAVENOUS | Status: DC
  Administered 2022-06-21 – 2022-06-22 (×2): 40 mg via INTRAVENOUS
  Filled 2022-06-21 (×2): qty 10

## 2022-06-21 MED ORDER — NOREPINEPHRINE 4 MG/250ML-% IV SOLN
2.0000 ug/min | INTRAVENOUS | Status: DC
  Administered 2022-06-22: 2 ug/min via INTRAVENOUS
  Filled 2022-06-21: qty 250

## 2022-06-21 MED ORDER — POLYETHYLENE GLYCOL 3350 17 G PO PACK: 17.0000 g | PACK | Freq: Every day | ORAL | Status: DC | PRN

## 2022-06-21 MED ORDER — DOCUSATE SODIUM 50 MG/5ML PO LIQD
100.0000 mg | Freq: Two times a day (BID) | ORAL | Status: DC
  Administered 2022-06-21 – 2022-06-22 (×3): 100 mg
  Filled 2022-06-21 (×3): qty 10

## 2022-06-21 MED ORDER — HEPARIN SODIUM (PORCINE) 5000 UNIT/ML IJ SOLN
5000.0000 [IU] | Freq: Three times a day (TID) | INTRAMUSCULAR | Status: DC
  Administered 2022-06-22 – 2022-07-07 (×47): 5000 [IU] via SUBCUTANEOUS
  Filled 2022-06-21 (×47): qty 1

## 2022-06-21 MED ORDER — ROCURONIUM BROMIDE 50 MG/5ML IV SOLN: INTRAVENOUS | Status: DC | PRN

## 2022-06-21 MED ORDER — PROPOFOL 10 MG/ML IV BOLUS
0.5000 mg/kg | Freq: Once | INTRAVENOUS | Status: AC
  Administered 2022-06-21: 25.7 mg via INTRAVENOUS

## 2022-06-21 MED ORDER — ORAL CARE MOUTH RINSE: 15.0000 mL | OROMUCOSAL | Status: DC | PRN

## 2022-06-21 MED ORDER — SODIUM CHLORIDE 0.9 % IV BOLUS
500.0000 mL | Freq: Once | INTRAVENOUS | Status: AC
  Administered 2022-06-21: 500 mL via INTRAVENOUS

## 2022-06-21 MED ORDER — DEXMEDETOMIDINE HCL IN NACL 400 MCG/100ML IV SOLN
0.0000 ug/kg/h | INTRAVENOUS | Status: DC
  Administered 2022-06-21: 0.4 ug/kg/h via INTRAVENOUS
  Filled 2022-06-21: qty 100

## 2022-06-21 MED ORDER — MIDAZOLAM HCL 5 MG/5ML IJ SOLN
INTRAMUSCULAR | Status: AC | PRN
  Administered 2022-06-21: 2 mg via INTRAVENOUS

## 2022-06-21 MED ORDER — DEXTROSE 50 % IV SOLN
12.5000 g | INTRAVENOUS | Status: AC
  Administered 2022-06-21: 12.5 g via INTRAVENOUS

## 2022-06-21 MED ORDER — PROPOFOL 1000 MG/100ML IV EMUL
0.0000 ug/kg/min | INTRAVENOUS | Status: DC
  Administered 2022-06-21: 40 ug/kg/min via INTRAVENOUS
  Administered 2022-06-22: 10 ug/kg/min via INTRAVENOUS
  Administered 2022-06-22: 20 ug/kg/min via INTRAVENOUS
  Filled 2022-06-21 (×2): qty 100

## 2022-06-21 MED ORDER — INSULIN ASPART 100 UNIT/ML IJ SOLN
0.0000 [IU] | INTRAMUSCULAR | Status: DC
  Administered 2022-06-22 (×4): 1 [IU] via SUBCUTANEOUS
  Administered 2022-06-23: 2 [IU] via SUBCUTANEOUS
  Administered 2022-06-23: 3 [IU] via SUBCUTANEOUS
  Administered 2022-06-23: 1 [IU] via SUBCUTANEOUS
  Administered 2022-06-24: 3 [IU] via SUBCUTANEOUS
  Administered 2022-06-24 (×2): 2 [IU] via SUBCUTANEOUS

## 2022-06-21 MED ORDER — MIDAZOLAM HCL 2 MG/2ML IJ SOLN
INTRAMUSCULAR | Status: AC
  Administered 2022-06-21: 2 mg
  Filled 2022-06-21: qty 2

## 2022-06-21 MED ORDER — CHLORHEXIDINE GLUCONATE CLOTH 2 % EX PADS
6.0000 | MEDICATED_PAD | Freq: Every day | CUTANEOUS | Status: DC
  Administered 2022-06-21 – 2022-07-03 (×13): 6 via TOPICAL

## 2022-06-21 MED ORDER — MIDAZOLAM HCL 2 MG/2ML IJ SOLN
2.0000 mg | Freq: Once | INTRAMUSCULAR | Status: DC
  Filled 2022-06-21: qty 2

## 2022-06-21 MED ORDER — ROCURONIUM BROMIDE 50 MG/5ML IV SOLN
INTRAVENOUS | Status: AC | PRN
  Administered 2022-06-21: 50 mg via INTRAVENOUS

## 2022-06-21 MED ORDER — ORAL CARE MOUTH RINSE
15.0000 mL | OROMUCOSAL | Status: DC
  Administered 2022-06-21 – 2022-06-26 (×59): 15 mL via OROMUCOSAL

## 2022-06-21 MED ORDER — SODIUM CHLORIDE 0.9 % IV SOLN
250.0000 mL | INTRAVENOUS | Status: DC
  Administered 2022-06-22: 250 mL via INTRAVENOUS

## 2022-06-21 MED ORDER — FENTANYL CITRATE PF 50 MCG/ML IJ SOSY
25.0000 ug | PREFILLED_SYRINGE | INTRAMUSCULAR | Status: DC | PRN
  Administered 2022-06-22: 50 ug via INTRAVENOUS
  Filled 2022-06-21 (×2): qty 1

## 2022-06-21 MED ORDER — IOHEXOL 350 MG/ML SOLN
60.0000 mL | Freq: Once | INTRAVENOUS | Status: AC | PRN
  Administered 2022-06-21: 60 mL via INTRAVENOUS

## 2022-06-21 MED ORDER — NOREPINEPHRINE 4 MG/250ML-% IV SOLN: 0.0000 ug/min | INTRAVENOUS | Status: DC

## 2022-06-21 MED ORDER — FENTANYL CITRATE PF 50 MCG/ML IJ SOSY
25.0000 ug | PREFILLED_SYRINGE | INTRAMUSCULAR | Status: DC | PRN
  Administered 2022-06-21: 25 ug via INTRAVENOUS
  Filled 2022-06-21: qty 1

## 2022-06-21 MED ORDER — DEXTROSE 50 % IV SOLN
INTRAVENOUS | Status: AC
  Filled 2022-06-21: qty 50

## 2022-06-21 MED ORDER — DOCUSATE SODIUM 100 MG PO CAPS: 100.0000 mg | ORAL_CAPSULE | Freq: Two times a day (BID) | ORAL | Status: DC | PRN

## 2022-06-21 NOTE — Progress Notes (Signed)
eLink Physician-Brief Progress Note Patient Name: [name] DOB: [birth year] MRN: [id number]   Date of Service  06/21/2022  HPI/Events of Note  Patient admitted with acute decompensation of Myasthenia Gravis causing respiratory failure and resulting in intubation and mechanical ventilation.  eICU Interventions  New Patient Evaluation        Migdalia Dk 06/21/2022, 8:04 PM

## 2022-06-21 NOTE — Progress Notes (Signed)
Transported pt from ED12 to 4N27 with bedside RN. Vitals are stable.

## 2022-06-21 NOTE — Assessment & Plan Note (Addendum)
With right palpebral ptosis. During hospitalization he was treated with IVIG x 2.  Treatment was not changed during hospitalization, states that his neurologist recently increased prednisone dose, 06/08/2022.   Currently he is on prednisone 5 mg 2 tablets daily and pyridostigmine 60 mg 3 times daily. He reports that most of the symptoms improved after taking prednisone, suggest to take 5 mg in the morning and 5 mg around noon instead taking both tablets together, he is afraid of making changes. Pending follow-up appointment with Dr. Kathyrn Lass.

## 2022-06-21 NOTE — ED Provider Triage Note (Signed)
Emergency Medicine Provider Triage Evaluation Note  [name] , a 67 y.o. male  was evaluated in triage.  Patient complains of shortness of breath.  Reports that it started yesterday and was present at rest however way worse with walking.  He was discharged from the emergency department 5 days ago after myasthenia exacerbation.  Review of Systems  Positive:  Negative: No fevers or chills  Physical Exam  BP (!) 172/110   Pulse (!) 126   Temp 97.6 F (36.4 C) (Oral)   Resp (!) 23   SpO2 99%  Gen:   Awake, no distress   Resp:  Normal effort  MSK:   Moves extremities without difficulty  Other:  Lung sounds clear  Medical Decision Making  Medically screening exam initiated at 3:54 PM.  Appropriate orders placed.  [name] was informed that the remainder of the evaluation will be completed by another provider, this initial triage assessment does not replace that evaluation, and the importance of remaining in the ED until their evaluation is complete.     Matthew Benders, PA-C 06/21/22 1555

## 2022-06-21 NOTE — ED Provider Notes (Addendum)
[place] EMERGENCY DEPARTMENT AT [facility] Provider Note   CSN: [id number] Arrival date & time: 06/21/22  1454     History  Chief Complaint  Patient presents with   Shortness of Breath    [name] is a 67 y.o. male.   Shortness of Breath    67 year old male with medical history significant for myasthenia gravis, recent discharge from the hospital 2 days ago after treatment with IVIG for his myasthenia who presents to the emergency department with acute onset shortness of breath and respiratory distress.  Patient states that over the last 2 days he has had worsening difficulty with breathing.  He initially felt symptomatically improved after IVIG but states that now he is having difficulty swallowing and difficulty breathing again.  He denies any chest pain.  He was initially seen by his PCP this morning and was sent to the emergency department due to concern for myasthenia gravis with acute exacerbation.  Home Medications Prior to Admission medications   Medication Sig Start Date End Date Taking? Authorizing Provider  ampicillin (PRINCIPEN) 500 MG capsule Take by mouth. 08/07/20   [provider]  calcium carbonate (OS-CAL) 600 MG TABS Take 600 mg by mouth 3 (three) times daily with meals.    [provider]  Cholecalciferol (VITAMIN D) 2000 UNITS tablet Take 2,000 Units by mouth daily.    [provider]  colesevelam (WELCHOL) 625 MG tablet Take 3 tablets (1,875 mg total) by mouth daily. 05/24/22   Shamleffer, Konrad Dolores, MD  desonide (DESONATE) 0.05 % gel Apply 1 application topically 2 (two) times daily as needed.     [provider]  entecavir (BARACLUDE) 1 MG tablet Take 1 tablet (1 mg total) by mouth daily. 07/13/11   Brooke Dare, MD  fluocinonide ointment (LIDEX) 0.05 % APPLY TO AFFECTED AREA(S) OF THE SKIN daily AS NEEDED. By dermatologist. 10/04/21   [place], [name], MD  FLUoxetine (PROZAC) 20 MG capsule TAKE ONE  CAPSULE BY MOUTH DAILY 01/25/22   [place], [name], MD  glucose blood (ONETOUCH ULTRA) test strip USE TO TEST BLOOD SUGAR 3 TIMES DAILY 12/10/20   Romero Belling, MD  ibuprofen (ADVIL) 200 MG tablet Take 400 mg by mouth every 8 (eight) hours as needed for headache or mild pain.    [provider]  Lancets [facility] ULTRASOFT) lancets USE AS INSTRUCTED TO CHECK BLOOD SUGARS THREE TIMES A DAY 01/13/19   Romero Belling, MD  metFORMIN (GLUCOPHAGE) 500 MG tablet Take 2 tablets (1,000 mg total) by mouth 2 (two) times daily. 02/03/22   Shamleffer, Konrad Dolores, MD  olmesartan (BENICAR) 20 MG tablet TAKE ONE TABLET BY MOUTH DAILY *DISCONTINUE DILITIAZEM* 05/28/22   Shamleffer, Konrad Dolores, MD  omeprazole (PRILOSEC) 20 MG capsule TAKE 1 CAPSULE BY MOUTH DAILY 05/29/22   [place], [name], MD  pimecrolimus (ELIDEL) 1 % cream Apply 1 application topically 2 (two) times daily as needed.  01/01/13   [provider]  predniSONE (DELTASONE) 5 MG tablet Take 10 mg by mouth as directed.    [provider]  pyridostigmine (MESTINON) 60 MG tablet Take 60 mg by mouth 3 (three) times daily.    [provider]  repaglinide (PRANDIN) 0.5 MG tablet Take 1 tablet (0.5 mg total) by mouth daily before supper. Patient taking differently: Take 0.5 mg by mouth daily as needed (when BG > 200). 02/03/22   Shamleffer, Konrad Dolores, MD  sitaGLIPtin (JANUVIA) 100 MG tablet Take 1 tablet (100 mg  total) by mouth daily. 02/03/22   Shamleffer, Konrad Dolores, MD  zolpidem (AMBIEN) 10 MG tablet TAKE 1/2 TO 1 TABLET (0.5-1) BY MOUTH EVERY NIGHT AT BEDTIME AS NEEDED 05/29/22   [place], [name], MD      Allergies    Azithromycin and Beta adrenergic blockers    Review of Systems   Review of Systems  Unable to perform ROS: Acuity of condition  Respiratory:  Positive for shortness of breath.     Physical Exam Updated Vital Signs BP (!) 209/163   Pulse (!) 164   Temp 97.6 F (36.4 C) (Oral)   Resp (!)  30   SpO2 100%  Physical Exam Vitals and nursing note reviewed.  Constitutional:      General: He is not in acute distress. HENT:     Head: Normocephalic and atraumatic.  Eyes:     Conjunctiva/sclera: Conjunctivae normal.     Pupils: Pupils are equal, round, and reactive to light.  Cardiovascular:     Rate and Rhythm: Normal rate and regular rhythm.  Pulmonary:     Effort: Tachypnea and respiratory distress present.  Abdominal:     General: There is no distension.     Tenderness: There is no guarding.  Musculoskeletal:        General: No deformity or signs of injury.     Cervical back: Neck supple.  Skin:    Findings: No lesion or rash.  Neurological:     General: No focal deficit present.     Mental Status: He is alert. Mental status is at baseline.     ED Results / Procedures / Treatments   Labs (all labs ordered are listed, but only abnormal results are displayed) Labs Reviewed  BASIC METABOLIC PANEL - Abnormal; Notable for the following components:      Result Value   Sodium 129 (*)    Chloride 92 (*)    Glucose, Bld 126 (*)    All other components within normal limits  SARS CORONAVIRUS 2 BY RT PCR  CBC WITH DIFFERENTIAL/PLATELET  BRAIN NATRIURETIC PEPTIDE  I-STAT ARTERIAL BLOOD GAS, ED  TROPONIN I (HIGH SENSITIVITY)    EKG None  Radiology CT Angio Chest PE W and/or Wo Contrast  Result Date: 06/21/2022 CLINICAL DATA:  Shortness of breath EXAM: CT ANGIOGRAPHY CHEST WITH CONTRAST TECHNIQUE: Multidetector CT imaging of the chest was performed using the standard protocol during bolus administration of intravenous contrast. Multiplanar CT image reconstructions and MIPs were obtained to evaluate the vascular anatomy. RADIATION DOSE REDUCTION: This exam was performed according to the departmental dose-optimization program which includes automated exposure control, adjustment of the mA and/or kV according to patient size and/or use of iterative reconstruction  technique. CONTRAST:  60mL OMNIPAQUE IOHEXOL 350 MG/ML SOLN COMPARISON:  CTA chest 03/24/2010 and chest radiograph 06/21/2022 FINDINGS: Cardiovascular: Sternotomy and CABG. Normal heart size. Mild dilation of the ascending aorta measuring 40 mm in diameter. Aortic atherosclerotic calcification. No pericardial effusion. Satisfactory opacification of the pulmonary arteries to the segmental level. No pulmonary embolism. Mediastinum/Nodes: Unremarkable trachea and esophagus. No thoracic adenopathy. Lungs/Pleura: Left lower lobe and lingula scarring/atelectasis. No focal consolidation, pleural effusion, or pneumothorax. 4 mm nodule in the left lower lobe (6/94) is stable since 03/04/2010 compatible with benign etiology. No follow-up recommended Upper Abdomen: No acute abnormality. Musculoskeletal: No acute abnormality. Review of the MIP images confirms the above findings. IMPRESSION: 1. No evidence of acute pulmonary embolism or other acute findings in the chest. 2. Mild dilation  of the ascending aorta measuring 40 mm in diameter. Recommend annual imaging followup by CTA or MRA. This recommendation follows 2010 ACCF/AHA/AATS/ACR/ASA/SCA/SCAI/SIR/STS/SVM Guidelines for the Diagnosis and Management of Patients with Thoracic Aortic Disease. Circulation. 2010; 121: R604-V409. Aortic aneurysm NOS (ICD10-I71.9) Aortic Atherosclerosis (ICD10-I70.0). Electronically Signed   By: Minerva Fester M.D.   On: 06/21/2022 17:48   DG Chest 2 View  Result Date: 06/21/2022 CLINICAL DATA:  Shortness of breath. EXAM: CHEST - 2 VIEW COMPARISON:  Jun 16, 2022. FINDINGS: The heart size and mediastinal contours are within normal limits. Both lungs are clear. The visualized skeletal structures are unremarkable. Sternotomy wires are noted. IMPRESSION: No active cardiopulmonary disease. Electronically Signed   By: Lupita Raider M.D.   On: 06/21/2022 16:25    Procedures .Critical Care  Performed by: Ernie Avena, MD Authorized by:  Ernie Avena, MD   Critical care provider statement:    Critical care time (minutes):  60   Critical care was necessary to treat or prevent imminent or life-threatening deterioration of the following conditions:  Respiratory failure   Critical care was time spent personally by me on the following activities:  Development of treatment plan with patient or surrogate, discussions with consultants, evaluation of patient's response to treatment, examination of patient, ordering and review of laboratory studies, ordering and review of radiographic studies, ordering and performing treatments and interventions, pulse oximetry, re-evaluation of patient's condition and review of old charts   Care discussed with: admitting provider   Procedure Name: Intubation Date/Time: 06/21/2022 6:41 PM  Performed by: Ernie Avena, MDPre-anesthesia Checklist: Patient identified, Patient being monitored, Emergency Drugs available, Timeout performed and Suction available Oxygen Delivery Method: Non-rebreather mask Preoxygenation: Pre-oxygenation with 100% oxygen Induction Type: Rapid sequence Ventilation: Mask ventilation without difficulty Laryngoscope Size: Glidescope and 3 Grade View: Grade I Tube size: 7.5 mm Number of attempts: 1 Placement Confirmation: ETT inserted through vocal cords under direct vision, CO2 detector and Breath sounds checked- equal and bilateral Secured at: 24 cm Tube secured with: ETT holder        Medications Ordered in ED Medications  midazolam (VERSED) 2 MG/2ML injection (has no administration in time range)  propofol (DIPRIVAN) 1000 MG/100ML infusion (40 mcg/kg/min  51.4 kg Intravenous New Bag/Given 06/21/22 1837)  iohexol (OMNIPAQUE) 350 MG/ML injection 60 mL (60 mLs Intravenous Contrast Given 06/21/22 1723)  midazolam (VERSED) 5 MG/5ML injection (2 mg Intravenous Given 06/21/22 1825)  etomidate (AMIDATE) injection (20 mg Intravenous Given 06/21/22 1828)  rocuronium (ZEMURON)  injection (50 mg Intravenous Given 06/21/22 1828)  propofol (DIPRIVAN) 10 mg/mL bolus/IV push 25.7 mg (25.7 mg Intravenous Given 06/21/22 1830)    ED Course/ Medical Decision Making/ A&P                             Medical Decision Making Amount and/or Complexity of Data Reviewed Radiology: ordered.  Risk Prescription drug management. Decision regarding hospitalization.   67 year old male with medical history significant for myasthenia gravis, recent discharge from the hospital 2 days ago after treatment with IVIG for his myasthenia who presents to the emergency department with acute onset shortness of breath and respiratory distress.  Patient states that over the last 2 days he has had worsening difficulty with breathing.  He initially felt symptomatically improved after IVIG but states that now he is having difficulty swallowing and difficulty breathing again.  He denies any chest pain.  He was initially seen by his PCP this  morning and was sent to the emergency department due to concern for myasthenia gravis with acute exacerbation.  On arrival, the patient was afebrile, tachycardic heart rate 126, tachypneic in the 20s and 30s, BP hypertensive 172/110, saturating 99% on room air.  Sinus tachycardia noted on cardiac telemetry.  Differential diagnosis includes myasthenia gravis crisis with resultant respiratory failure, PE, ACS, pneumothorax, viral syndrome, pleural effusion, pneumonia.  On my evaluation, I called for RT who performed a negative inspiratory force and was found to be -20 and -19.  His vital capacity was found to be below 30 cc/kg based on his weight in kilograms.  I have concern for acute respiratory failure in the setting of myasthenic crisis.  I called neurology, Dr. Selina Cooley who evaluated the patient bedside and was in agreement.  The patient was verbally consented for intubation.  He agrees to be intubated and put on a ventilator for this.  I consulted pulmonary critical care.   The patient was subsequently intubated with 2 mg of Versed for premedication, 20 mg of etomidate and 50 mg of rocuronium and started on propofol gtt. for sedation postintubation.  Intubation was uncomplicated.  PCCM, Dr. Myrla Halsted was consulted for admission for ventilator management.  The patient was subsequently admitted in critical but stabilized condition.   Final Clinical Impression(s) / ED Diagnoses Final diagnoses:  Myasthenic crisis (HCC)  Acute respiratory failure, unspecified whether with hypoxia or hypercapnia [facility])    Rx / DC Orders ED Discharge Orders     None         Ernie Avena, MD 06/21/22 1842    Ernie Avena, MD 06/21/22 [phone]

## 2022-06-21 NOTE — Progress Notes (Signed)
NIF -18, -19, -20 with three good attempts VC 0.80, 0.82, 0.97 with three good attempts.

## 2022-06-21 NOTE — ED Triage Notes (Addendum)
Pt came in via POV d/t SOB that started yesterday & it is very hard for him to tolerate walking d/t the exertion. A/Ox4, denies CP or any other pain. Endorses decreased appetite & recenet weight loss, unable to tell how much.

## 2022-06-21 NOTE — Telephone Encounter (Signed)
Pt is calling and was given phone number [phone] . Pt called and they per pt are not psychiatry office

## 2022-06-21 NOTE — Assessment & Plan Note (Signed)
Today DBP mildly elevated. He reports lower BP readings at home. Continue Benicar 20 mg daily and low salt diet. Continue monitoring BP regularly.

## 2022-06-21 NOTE — Assessment & Plan Note (Signed)
Mild-moderate. Some of his medications can aggravate problem. Fluid restriction reco mended for now. Further recommendations according to BMP result. Instructed about warning signs.

## 2022-06-21 NOTE — Assessment & Plan Note (Signed)
History of PTSD. Recommend establishing with psychiatrist, he will also benefit from CBT.  Referral placed. Continue fluoxetine 20 mg daily. I do not feel comfortable adding Xanax at this time, we discussed some side effects.

## 2022-06-21 NOTE — Assessment & Plan Note (Signed)
Not well-controlled and getting worse. Continue fluoxetine 20 mg daily. Referral to psychiatrist placed, contact information given, so he can call and arrange appointment. Instructed about warning signs.

## 2022-06-21 NOTE — Assessment & Plan Note (Signed)
Currently on entecavir 1 mg daily. Follows with ID and has liver US regularly, I do not have copy of the most recent imaging.

## 2022-06-21 NOTE — H&P (Addendum)
NAME:  [name], MRN:  [id number], DOB:  December 17, 1955, LOS: 0 ADMISSION DATE:  06/21/2022, CONSULTATION DATE:  5/22 REFERRING MD:  Karene Fry, CHIEF COMPLAINT:  SOB   History of Present Illness:  67 year old man w/ hx of MG and recent admit for flare presenting to ER for worsening SOB.  Low NIF in ER with extreme tachypnea so intubated for airway protection.  No infectious prodrome, seems to have started just after discharged 2 days ago.  History per wife at bedside.  Neurology has seen, plans for IVIG.  Pertinent  Medical History  MG DM2 Anxiety  Significant Hospital Events: Including procedures, antibiotic start and stop dates in addition to other pertinent events   5/22 admit  Interim History / Subjective:  Admit  Objective   Blood pressure (!) 209/163, pulse (!) 164, temperature 97.6 F (36.4 C), temperature source Oral, resp. rate (!) 30, SpO2 100 %.       No intake or output data in the 24 hours ending 06/21/22 1837 There were no vitals filed for this visit.  Examination: General: no distress intubated/sedated/paralyzed on vent HENT: ETT in place minimal secretions Lungs: clear, no wheezing, passive on vent Cardiovascular: tachy, ext warm Abdomen: soft, +BS Extremities: no edema Neuro: paralyzed from induction GU: foley dark urine  CBC normal Na unchanged from 2 days ago BNP normal CTA chest neg  Resolved Hospital Problem list   N/A  Assessment & Plan:  Acute hypoxemic and hypercarbic respiratory failure due to recurrent MG crisis with unclear trigger.  DM2 without hyperglycemia  GERD  Chronic hyponatremia  Dilated thorocic aorta  - Vent bundle - IVIG, mestinon per neuro - PAD pundle - SSI   Best Practice (right click and "Reselect all SmartList Selections" daily)   Diet/type: NPO DVT prophylaxis: prophylactic heparin  GI prophylaxis: PPI Lines: N/A Foley:  Yes, and it is still needed Code Status:  full code Last date of multidisciplinary  goals of care discussion [wife updated at bedside]  Labs   CBC: Recent Labs  Lab 06/16/22 0722 06/21/22 1605  WBC 9.0 6.0  NEUTROABS 4.6 3.8  HGB 14.3 14.2  HCT 41.9 40.4  MCV 83.8 81.1  PLT 288 192    Basic Metabolic Panel: Recent Labs  Lab 06/16/22 0722 06/17/22 0435 06/21/22 0926 06/21/22 1605  NA 130* 128* 128* 129*  K 3.5 3.6 4.1 3.9  CL 92* 94* 94* 92*  CO2 27 27 29 25   GLUCOSE 104* 90 150* 126*  BUN 7* 7* 9 9  CREATININE 1.02 0.89 0.96 1.00  CALCIUM 9.1 8.6* 9.7 9.7   GFR: Estimated Creatinine Clearance: 53.5 mL/min (by C-G formula based on SCr of 1 mg/dL). Recent Labs  Lab 06/16/22 0722 06/21/22 1605  WBC 9.0 6.0    Liver Function Tests: Recent Labs  Lab 06/16/22 0722  AST 21  ALT 15  ALKPHOS 35*  BILITOT 0.6  PROT 7.0  ALBUMIN 4.1   No results for input(s): "LIPASE", "AMYLASE" in the last 168 hours. No results for input(s): "AMMONIA" in the last 168 hours.  ABG    Component Value Date/Time   TCO2 27 03/11/2010 0839     Coagulation Profile: No results for input(s): "INR", "PROTIME" in the last 168 hours.  Cardiac Enzymes: No results for input(s): "CKTOTAL", "CKMB", "CKMBINDEX", "TROPONINI" in the last 168 hours.  HbA1C: Hemoglobin A1C  Date/Time Value Ref Range Status  02/03/2022 08:31 AM 5.9 (A) 4.0 - 5.6 % Final  05/02/2021 10:16 AM  5.9 (A) 4.0 - 5.6 % Final   Hgb A1c MFr Bld  Date/Time Value Ref Range Status  06/16/2022 07:22 AM 5.7 (H) 4.8 - 5.6 % Final    Comment:    (NOTE) Pre diabetes:          5.7%-6.4%  Diabetes:              >6.4%  Glycemic control for   <7.0% adults with diabetes   09/17/2017 09:47 PM 5.9 (H) 4.8 - 5.6 % Final    Comment:    (NOTE) Pre diabetes:          5.7%-6.4% Diabetes:              >6.4% Glycemic control for   <7.0% adults with diabetes     CBG: Recent Labs  Lab 06/18/22 0814 06/18/22 1157 06/18/22 1824 06/18/22 2106 06/19/22 0837  GLUCAP 121* 109* 131* 115* 91    Review  of Systems:   Intubated/sedated  Past Medical History:  He,  has a past medical history of Anxiety, Colon polyp, Depressive disorder, not elsewhere classified, External hemorrhoid, Insomnia, unspecified, Internal hemorrhoids, Myasthenia gravis without exacerbation (HCC), Other and unspecified hyperlipidemia, 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   Surgical History:   Past Surgical History:  Procedure Laterality Date   THYMECTOMY       Social History:   reports that he has never smoked. He has never used smokeless tobacco. He reports that he does not drink alcohol and does not use drugs.   Family History:  His family history is not on file.   Allergies Allergies  Allergen Reactions   Azithromycin Other (See Comments)    REACTION: exac of his mg   Beta Adrenergic Blockers Other (See Comments)    REACTION: exac of mg     Home Medications  Prior to Admission medications   Medication Sig Start Date End Date Taking? Authorizing Provider  ampicillin (PRINCIPEN) 500 MG capsule Take by mouth. 08/07/20   [provider]  calcium carbonate (OS-CAL) 600 MG TABS Take 600 mg by mouth 3 (three) times daily with meals.    [provider]  Cholecalciferol (VITAMIN D) 2000 UNITS tablet Take 2,000 Units by mouth daily.    [provider]  colesevelam (WELCHOL) 625 MG tablet Take 3 tablets (1,875 mg total) by mouth daily. 05/24/22   Shamleffer, Konrad Dolores, MD  desonide (DESONATE) 0.05 % gel Apply 1 application topically 2 (two) times daily as needed.     [provider]  entecavir (BARACLUDE) 1 MG tablet Take 1 tablet (1 mg total) by mouth daily. 07/13/11   Brooke Dare, MD  fluocinonide ointment (LIDEX) 0.05 % APPLY TO AFFECTED AREA(S) OF THE  SKIN daily AS NEEDED. By dermatologist. 10/04/21   [place], [name], MD  FLUoxetine (PROZAC) 20 MG capsule TAKE ONE CAPSULE BY MOUTH DAILY 01/25/22   [place], [name], MD  glucose blood (ONETOUCH ULTRA) test strip USE TO TEST BLOOD SUGAR 3 TIMES DAILY 12/10/20   Romero Belling, MD  ibuprofen (ADVIL) 200 MG tablet Take 400 mg by mouth every 8 (eight) hours as needed for headache or mild pain.    [provider]  Lancets [facility] ULTRASOFT) lancets USE AS INSTRUCTED TO CHECK BLOOD SUGARS THREE TIMES A DAY 01/13/19   Everardo All,  Gregary Signs, MD  metFORMIN (GLUCOPHAGE) 500 MG tablet Take 2 tablets (1,000 mg total) by mouth 2 (two) times daily. 02/03/22   Shamleffer, Konrad Dolores, MD  olmesartan (BENICAR) 20 MG tablet TAKE ONE TABLET BY MOUTH DAILY *DISCONTINUE DILITIAZEM* 05/28/22   Shamleffer, Konrad Dolores, MD  omeprazole (PRILOSEC) 20 MG capsule TAKE 1 CAPSULE BY MOUTH DAILY 05/29/22   [place], [name], MD  pimecrolimus (ELIDEL) 1 % cream Apply 1 application topically 2 (two) times daily as needed.  01/01/13   [provider]  predniSONE (DELTASONE) 5 MG tablet Take 10 mg by mouth as directed.    [provider]  pyridostigmine (MESTINON) 60 MG tablet Take 60 mg by mouth 3 (three) times daily.    [provider]  repaglinide (PRANDIN) 0.5 MG tablet Take 1 tablet (0.5 mg total) by mouth daily before supper. Patient taking differently: Take 0.5 mg by mouth daily as needed (when BG > 200). 02/03/22   Shamleffer, Konrad Dolores, MD  sitaGLIPtin (JANUVIA) 100 MG tablet Take 1 tablet (100 mg total) by mouth daily. 02/03/22   Shamleffer, Konrad Dolores, MD  zolpidem (AMBIEN) 10 MG tablet TAKE 1/2 TO 1 TABLET (0.5-1) BY MOUTH EVERY NIGHT AT BEDTIME AS NEEDED 05/29/22   [place], [name], MD     Critical care time: 33 minutes

## 2022-06-21 NOTE — Progress Notes (Signed)
CBG on admission was 69. Hypoglycemia protocol used and given 12.5G D50 IV with good response.

## 2022-06-21 NOTE — Code Documentation (Signed)
Time out Done with patient and staff.

## 2022-06-21 NOTE — ED Notes (Signed)
ED TO INPATIENT HANDOFF REPORT  ED Nurse Name and Phone #: Minerva Areola 40  S Name/Age/Gender [name] 67 y.o. male Room/Bed: 012C/012C  Code Status   Code Status: Full Code  Home/SNF/Other Home Patient oriented to: self, place, time, and situation Is this baseline? Yes   Triage Complete: Triage complete  Chief Complaint Myasthenic crisis [facility]) [G70.01]  Triage Note Pt came in via POV d/t SOB that started yesterday & it is very hard for him to tolerate walking d/t the exertion. A/Ox4, denies CP or any other pain. Endorses decreased appetite & recenet weight loss, unable to tell how much.   Allergies Allergies  Allergen Reactions   Azithromycin Other (See Comments)    REACTION: exac of his mg   Beta Adrenergic Blockers Other (See Comments)    REACTION: exac of mg    Level of Care/Admitting Diagnosis ED Disposition     ED Disposition  Admit   Condition  --   Comment  Hospital Area: [facility] [[id number]]  Level of Care: ICU [6]  May admit patient to [facility] or [facility] if equivalent level of care is available:: No  Covid Evaluation: Asymptomatic - no recent exposure (last 10 days) testing not required  Diagnosis: Myasthenic crisis [facility]) [[id number]]  Admitting Physician: Lorin Glass [[id number]]  Attending Physician: Lorin Glass [[id number]]  Certification:: I certify this patient will need inpatient services for at least 2 midnights  Estimated Length of Stay: 5          B Medical/Surgery History 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 Oral 06/21/22  1835  Oral  less than 1   Urethral Catheter Travis RN 16 Fr. 06/21/22  1843  --  less than 1   Airway 7.5 mm 06/21/22  1841  -- less than 1            Intake/Output Last 24 hours No intake or output data in the 24 hours ending 06/21/22 1914  Labs/Imaging Results for orders placed or performed during the hospital encounter of 06/21/22 (from the past 48 hour(s))  CBC with Differential     Status: None   Collection Time: 06/21/22  4:05 PM  Result Value Ref Range   WBC 6.0 4.0 - 10.5 K/uL   RBC 4.98 4.22 - 5.81 MIL/uL   Hemoglobin 14.2 13.0 - 17.0 g/dL   HCT 29.5 62.1 - 30.8 %   MCV 81.1 80.0 - 100.0 fL   MCH 28.5 26.0 - 34.0 pg   MCHC 35.1 30.0 - 36.0 g/dL   RDW 13.5  11.5 - 15.5 %   Platelets 192 150 - 400 K/uL   nRBC 0.0 0.0 - 0.2 %   Neutrophils Relative % 62 %   Neutro Abs 3.8 1.7 - 7.7 K/uL   Lymphocytes Relative 23 %   Lymphs Abs 1.4 0.7 - 4.0 K/uL   Monocytes Relative 14 %   Monocytes Absolute 0.8 0.1 - 1.0 K/uL   Eosinophils Relative 0 %   Eosinophils Absolute 0.0 0.0 - 0.5 K/uL   Basophils Relative 1 %   Basophils Absolute 0.0 0.0 - 0.1 K/uL   Immature Granulocytes 0 %   Abs Immature Granulocytes 0.01 0.00 - 0.07 K/uL    Comment: Performed at [facility], [address], [place], [place] [address]  Basic metabolic panel     Status: Abnormal   Collection Time: 06/21/22  4:05 PM  Result Value Ref Range   Sodium 129 (L) 135 - 145 mmol/L   Potassium 3.9 3.5 - 5.1 mmol/L   Chloride 92 (L) 98 - 111 mmol/L    CO2 25 22 - 32 mmol/L   Glucose, Bld 126 (H) 70 - 99 mg/dL    Comment: Glucose reference range applies only to samples taken after fasting for at least 8 hours.   BUN 9 8 - 23 mg/dL   Creatinine, Ser 5.62 0.61 - 1.24 mg/dL   Calcium 9.7 8.9 - 13.0 mg/dL   GFR, Estimated >86 >57 mL/min    Comment: (NOTE) Calculated using the CKD-EPI Creatinine Equation (2021)    Anion gap 12 5 - 15    Comment: Performed at [facility], [address] [place], [place] [address]  Brain natriuretic peptide     Status: None   Collection Time: 06/21/22  4:05 PM  Result Value Ref Range   B Natriuretic Peptide 28.2 0.0 - 100.0 pg/mL    Comment: Performed at [facility], [address], [place], [place] [address]   CT Angio Chest PE W and/or Wo Contrast  Result Date: 06/21/2022 CLINICAL DATA:  Shortness of breath EXAM: CT ANGIOGRAPHY CHEST WITH CONTRAST TECHNIQUE: Multidetector CT imaging of the chest was performed using the standard protocol during bolus administration of intravenous contrast. Multiplanar CT image reconstructions and MIPs were obtained to evaluate the vascular anatomy. RADIATION DOSE REDUCTION: This exam was performed according to the departmental dose-optimization program which includes automated exposure control, adjustment of the mA and/or kV according to patient size and/or use of iterative reconstruction technique. CONTRAST:  60mL OMNIPAQUE IOHEXOL 350 MG/ML SOLN COMPARISON:  CTA chest 03/24/2010 and chest radiograph 06/21/2022 FINDINGS: Cardiovascular: Sternotomy and CABG. Normal heart size. Mild dilation of the ascending aorta measuring 40 mm in diameter. Aortic atherosclerotic calcification. No pericardial effusion. Satisfactory opacification of the pulmonary arteries to the segmental level. No pulmonary embolism. Mediastinum/Nodes: Unremarkable trachea and esophagus. No thoracic adenopathy. Lungs/Pleura: Left lower lobe and lingula scarring/atelectasis. No focal consolidation,  pleural effusion, or pneumothorax. 4 mm nodule in the left lower lobe (6/94) is stable since 03/04/2010 compatible with benign etiology. No follow-up recommended Upper Abdomen: No acute abnormality. Musculoskeletal: No acute abnormality. Review of the MIP images confirms the above findings. IMPRESSION: 1. No evidence of acute pulmonary embolism or other acute findings in the chest. 2. Mild dilation of the ascending aorta measuring 40 mm in diameter. Recommend annual imaging followup by CTA or MRA. This recommendation follows 2010 ACCF/AHA/AATS/ACR/ASA/SCA/SCAI/SIR/STS/SVM Guidelines for the Diagnosis and Management of Patients with Thoracic Aortic Disease. Circulation. 2010; 121: U132-G401.  Aortic aneurysm NOS (ICD10-I71.9) Aortic Atherosclerosis (ICD10-I70.0). Electronically Signed   By: Minerva Fester M.D.   On: 06/21/2022 17:48   DG Chest 2 View  Result Date: 06/21/2022 CLINICAL DATA:  Shortness of breath. EXAM: CHEST - 2 VIEW COMPARISON:  Jun 16, 2022. FINDINGS: The heart size and mediastinal contours are within normal limits. Both lungs are clear. The visualized skeletal structures are unremarkable. Sternotomy wires are noted. IMPRESSION: No active cardiopulmonary disease. Electronically Signed   By: Lupita Raider M.D.   On: 06/21/2022 16:25    Pending Labs Unresulted Labs (From admission, onward)     Start     Ordered   06/21/22 1741  SARS Coronavirus 2 by RT PCR (hospital order, performed in [facility] hospital lab) *cepheid single result test* Anterior Nasal Swab  (Tier 2 - SARS Coronavirus 2 by RT PCR (hospital order, performed in [facility] hospital lab) *cepheid single result test*)  Once,   URGENT        06/21/22 1740            Vitals/Pain Today's Vitals   06/21/22 1833 06/21/22 1840 06/21/22 1845 06/21/22 1852  BP: (!) 179/127 (!) 144/101  133/82  Pulse: (!) 165 (!) 139 (!) 135 (!) 127  Resp: 16 19 16 16   Temp:      TempSrc:      SpO2: 100% 100% 100% 100%  Height: 5\' 5"   (1.651 m)     PainSc:        Isolation Precautions No active isolations  Medications Medications  propofol (DIPRIVAN) 1000 MG/100ML infusion (40 mcg/kg/min  51.4 kg Intravenous New Bag/Given 06/21/22 1837)  heparin injection 5,000 Units (has no administration in time range)  pantoprazole (PROTONIX) injection 40 mg (has no administration in time range)  insulin aspart (novoLOG) injection 0-9 Units (has no administration in time range)  midazolam (VERSED) injection 1-2 mg (has no administration in time range)  midazolam (VERSED) injection 2 mg (has no administration in time range)  docusate (COLACE) 50 MG/5ML liquid 100 mg (has no administration in time range)  polyethylene glycol (MIRALAX / GLYCOLAX) packet 17 g (has no administration in time range)  dexmedetomidine (PRECEDEX) 400 MCG/100ML (4 mcg/mL) infusion (has no administration in time range)  fentaNYL (SUBLIMAZE) injection 25 mcg (has no administration in time range)  fentaNYL (SUBLIMAZE) injection 25-100 mcg (has no administration in time range)  iohexol (OMNIPAQUE) 350 MG/ML injection 60 mL (60 mLs Intravenous Contrast Given 06/21/22 1723)  midazolam (VERSED) 5 MG/5ML injection (2 mg Intravenous Given 06/21/22 1825)  midazolam (VERSED) 2 MG/2ML injection (2 mg  Given 06/21/22 1858)  etomidate (AMIDATE) injection (20 mg Intravenous Given 06/21/22 1828)  rocuronium (ZEMURON) injection (50 mg Intravenous Given 06/21/22 1828)  propofol (DIPRIVAN) 10 mg/mL bolus/IV push 25.7 mg (25.7 mg Intravenous Given 06/21/22 1830)    Mobility walks     Focused Assessments Cardiac Assessment Handoff:    No results found for: "CKTOTAL", "CKMB", "CKMBINDEX", "TROPONINI" No results found for: "DDIMER" Does the Patient currently have chest pain? No    R Recommendations: See Admitting Provider Note  Report given to:   Additional Notes: intubated

## 2022-06-21 NOTE — Patient Instructions (Addendum)
A few things to remember from today's visit:  Generalized anxiety disorder - Plan: Ambulatory referral to Psychiatry  Hypertension, essential, benign - Plan: Basic metabolic panel  Myasthenia gravis with acute exacerbation (HCC)  Chronic hepatitis B virus infection (HCC), Chronic  SOB (shortness of breath)  If difficulty breathing gets worse you need to go to the ER. Call your neurologist's office as we discussed. Continue monitoring blood pressure. No changes today. Call psychiatrist's office.  If you need refills for medications you take chronically, please call your pharmacy. Do not use My Chart to request refills or for acute issues that need immediate attention. If you send a my chart message, it may take a few days to be addressed, specially if I am not in the office.  Please be sure medication list is accurate. If a new problem present, please set up appointment sooner than planned today.

## 2022-06-21 NOTE — ED Notes (Signed)
Patient c/o worsening SOB due to MG. Refused any oxygen support at this time/ SPO2@ 100%. MD made aware

## 2022-06-21 NOTE — Consult Note (Signed)
Neurology Consultation    Reason for Consult:Myasthenia exacerbation  CC: Shortness of breath  HISTORY OF PRESENT ILLNESS   HPI   [name] is a 67 y.o. male with a past medical history of eropositive myasthenia gravis, malignant thymoma s/p radiation and resection in 2003,  PTSD, DM2, hepatitis B, anxiety, depression, and insomnia. He follows with Dr. Jeremy Johann at [facility] and saw her last on 06/08/2022. He has received IVIG and PLEX in the past for exacerbations, both with therapeutic effect. At his visit on 5/9 he stated that he felt he is weak all over with head drop, right arm weakness, SOB, chewing difficulty. She increased his prednisone to 10mg  daily and he was admitted to [facility] from 5/17- 5/20 after receiving IVIG 400mg /kg given on 5/17 and then 800mg /kg on 5/18 and 5/19. He felt that he initially improved and then started having trouble again on Tuesday. His wife also states that he started having shortness of breath on Tuesday and then was seen by his PCP this morning and recommended to go to the ED due to worsening SOB and difficulty swallowing.  On our arrival he is short of breath with RR 34, spO2 100%, able to count to 10 on a single breath. NIF -19. He feels that he can't taken in a full breath.   History is obtained from:Wife, Patient, Chart review   ROS: Pertinent items noted in HPI and remainder of comprehensive ROS otherwise negative.   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 History reviewed. No pertinent family history.  Allergies:  Allergies  Allergen Reactions   Azithromycin Other (See Comments)    REACTION: exac of his mg   Beta Adrenergic Blockers Other (See Comments)    REACTION: exac of mg    Social History:   reports that he has never smoked. He has never used smokeless tobacco. He reports that he does not drink alcohol and does not use drugs.    Medications (Not in a hospital admission)   EXAMINATION    Current vital signs:    06/21/2022    6:30 PM 06/21/2022    6:15 PM 06/21/2022    6:00 PM  Vitals with BMI  Systolic 209 179 045  Diastolic 163 120 409  Pulse 164 132 131    Examination:  GENERAL: Awake, alert, anxious HEENT: - Normocephalic and atraumatic, dry mm, no lymphadenopathy, no Thyromegally LUNGS - Regular, unlabored respirations CV - S1S2 RRR, equal pulses bilaterally. ABDOMEN - Soft, nontender, nondistended with normoactive BS Ext: warm, well perfused, intact peripheral pulses, no pedal edema Integumentary:  Skin intact on clothed exam  NEURO:  Mental Status: AA&Ox3  Language: speech is soft.  Intact naming, repetition, fluency, and comprehension. Counts to 10 on a single breath Neck flexion is 4/5 Cranial Nerves:  II: PERRL. Visual fields full to confrontation.  III, IV,  VI: dysconjugate gaze. Right eye ptosis, eye is nearly closed. Endorses blurry vision in the right eye V: Facial sensation intact VII: no facial asymmetry   VIII: hearing intact to voice IX, X: palate elevates symmetrically, cough is weak [id number] shrug 5/5 and symmetrical  XII: tongue is midline without fasciculations. Motor:  RUE: grip   4/5    biceps  4/5    triceps  4/5                                                    LUE: grip  5/5    biceps   5/5    triceps  5/5 RLE: thigh  5/5    knee   5/5     plantar flexion   5/5     dorsiflexion   5/5        LLE: thigh   5/5    knee  5/5    plantar flexion    5/5     dorsiflexion    5/5  Tone: is normal and bulk is normal Sensation- Intact to light touch and temperature bilaterally Coordination: FTN intact bilaterally, no ataxia in BLE, no abnormal movements  Gait- deferred        LABS   I have reviewed labs in epic and the results pertinent to this consultation are:  Lab Results  Component Value Date   LDLCALC 68 07/23/2008   Lab Results  Component Value Date   ALT 15 06/16/2022   AST 21 06/16/2022   ALKPHOS 35 (L) 06/16/2022   BILITOT 0.6 06/16/2022   Lab Results  Component Value Date   HGBA1C 5.7 (H) 06/16/2022   Lab Results  Component Value Date   WBC 6.0 06/21/2022   HGB 14.2 06/21/2022   HCT 40.4 06/21/2022   MCV 81.1 06/21/2022   PLT 192 06/21/2022   No results found for: "VITAMINB12" No results found for: "FOLATE" Lab Results  Component Value Date   NA 129 (L) 06/21/2022   K 3.9 06/21/2022   CL 92 (L) 06/21/2022   CO2 25 06/21/2022     DIAGNOSTIC IMAGING/PROCEDURES   I have reviewed the images obtained:, as below  MRI C-Spine 1. Soft disc extrusion at C5-6 to the right of midline which could affect the right C6 and C7 nerves. 2. Slight bilateral foraminal narrowing at C6-7.   ASSESSMENT/PLAN    Assessment:  67 y.o. male with a pertinent medical history of seropositive myasthenia gravis, malignant thymoma s/p radiation and resection in 2003, PTSD, DM2, hepatitis B, anxiety, depression, and insomnia. He follows with Dr. Jeremy Johann at [facility] and saw her last on 06/08/2022. He has received IVIG and PLEX in the past for exacerbations, both with therapeutic effect. At his visit on 5/9 he stated that he felt he is weak all over with head drop, right arm weakness, SOB, chewing difficulty. She increased his prednisone to 10mg  daily. He received IVIG from 5/17-5/19 and was discharged feeling improved. Today he presents back to the ED with shortness of  breath and on evaluation NIF -19 in ED. Intubated by EDP. Plan to admit to CCM and may consider another round of IVIG.    Impression: MG Exacerbation  Recommendations: - Vent management per PCCM - Continue home prednisone  - I ordered IVIG 0.4g/Kg every 24 hours x 5  doses. - Every 8 hours NIFs and VC - NPO until swallow eval by SLP - Medications that may worsen or trigger MG exacerbation: Class IA antiarrhythmics, magnesium, flouroquinolones, macrolides, aminoglycosides, penicillamine, curare, interferon alpha, botox, quinine. Use with caution: calcium channel blocker, beta blockers and statins.     Elmer Picker, DNP, FNP-BC [facility] Pager: [phone]    **This documentation was dictated using Dragon Medical Software and may contain inadvertent errors **   Attending Neurohospitalist Addendum Patient seen and examined with APP/Resident. Agree with the history and physical as documented above. Agree with the plan as documented, which I helped formulate. I have edited the note above to reflect my full findings and recommendations. I have independently reviewed the chart, obtained history, review of systems and examined the patient.I have personally reviewed pertinent head/neck/spine imaging (CT/MRI). Please feel free to call with any questions.  This is an extremely complicated gentleman with hx seropositive myasthenia gravis recently admitted for exacerbation. He improved on IVIG and was discharged to home 3 days ago, at which point his respiratory function was vastly improved although he did have some residual ptosis. He now presents 3 days after discharge with respiratory distress and NIF -19. Both his inpatient and outpatient neurologists have raised the concerns that some of his symptoms and exacerbations may be more somatoform in etiology 2/2 his severe anxiety 2/2 PTSD may be playing a role. It is very surprising that he would respond to IVIG last week and  then deteriorate so quickly at home. (IVIG keeps working after the last infusion and patient's continue to experience improvement for a few weeks after infusion). The 2 primary options for acute treatment if he is having recurrent flare would be repeat IVIG and PLEX. There is not excellent data to support repeat treatment with IVIG but it may be warranted in this gentleman. The other option would be PLEX, however this is rarely done after IVIG since PLEX removes the circulating IVIG and obviates any potential subsequent benefit from it. Regardless, neither of those treatments will improve his condition overnight (they take 2-3 days to show effect). Given concern that his presentation may have other contributing factors rather than true MG flare, we plan to reassess patient in AM and provide further guidance at that time.  This patient is critically ill and at significant risk of neurological worsening, death and care requires constant monitoring of vital signs, hemodynamics,respiratory and cardiac monitoring, neurological assessment, discussion with family, other specialists and medical decision making of high complexity. I spent 70 minutes of neurocritical care time  in the care of  this patient. This was time spent independent of any time provided by nurse practitioner or PA.  Bing Neighbors, MD [facility] [phone]  If 7pm- 7am, please page neurology on call as listed in AMION.

## 2022-06-21 NOTE — ED Notes (Signed)
Icu at bedside

## 2022-06-21 NOTE — Telephone Encounter (Signed)
Noted, new referral placed to [facility].

## 2022-06-21 NOTE — ED Notes (Addendum)
Correction: Patient is now intubated

## 2022-06-21 NOTE — Progress Notes (Signed)
eLink Physician-Brief Progress Note Patient Name: [name] DOB: 04-Nov-1955 MRN: [id number]   Date of Service  06/21/2022  HPI/Events of Note  Patient was started on 0.4 mcg of Precedex then dropped his blood pressure into the 60's.  eICU Interventions  NS 500 ml iv fluid bolus x 1, Norepinephrine gtt, D5 % LR gtt at 100 ml / hour x 24 hours.        Thomasene Lot Kiaria Quinnell 06/21/2022, 9:15 PM

## 2022-06-21 NOTE — Progress Notes (Signed)
Primary RN at bedside reports that pt blood pressure has improved and there is not a need for USGPIV at this time. Advised to place additional consult to IV team if need arises.

## 2022-06-21 NOTE — ED Notes (Signed)
Patient continues in 140-150HR, MD made aware

## 2022-06-22 ENCOUNTER — Inpatient Hospital Stay (HOSPITAL_COMMUNITY): Payer: Medicare Other

## 2022-06-22 ENCOUNTER — Encounter (HOSPITAL_COMMUNITY): Payer: Self-pay | Admitting: Internal Medicine

## 2022-06-22 DIAGNOSIS — J988 Other specified respiratory disorders: Secondary | ICD-10-CM | POA: Diagnosis not present

## 2022-06-22 DIAGNOSIS — J9600 Acute respiratory failure, unspecified whether with hypoxia or hypercapnia: Secondary | ICD-10-CM | POA: Diagnosis not present

## 2022-06-22 DIAGNOSIS — I7121 Aneurysm of the ascending aorta, without rupture: Secondary | ICD-10-CM

## 2022-06-22 DIAGNOSIS — G7001 Myasthenia gravis with (acute) exacerbation: Secondary | ICD-10-CM | POA: Diagnosis not present

## 2022-06-22 LAB — CBC
HCT: 31.7 % — ABNORMAL LOW (ref 39.0–52.0)
Hemoglobin: 11.4 g/dL — ABNORMAL LOW (ref 13.0–17.0)
MCH: 29 pg (ref 26.0–34.0)
MCHC: 36 g/dL (ref 30.0–36.0)
MCV: 80.7 fL (ref 80.0–100.0)
Platelets: 166 10*3/uL (ref 150–400)
RBC: 3.93 MIL/uL — ABNORMAL LOW (ref 4.22–5.81)
RDW: 13.5 % (ref 11.5–15.5)
WBC: 8.9 10*3/uL (ref 4.0–10.5)
nRBC: 0 % (ref 0.0–0.2)

## 2022-06-22 LAB — POCT I-STAT 7, (LYTES, BLD GAS, ICA,H+H)
Acid-base deficit: 2 mmol/L (ref 0.0–2.0)
Bicarbonate: 22 mmol/L (ref 20.0–28.0)
Calcium, Ion: 1.21 mmol/L (ref 1.15–1.40)
HCT: 33 % — ABNORMAL LOW (ref 39.0–52.0)
Hemoglobin: 11.2 g/dL — ABNORMAL LOW (ref 13.0–17.0)
O2 Saturation: 98 %
Patient temperature: 37
Potassium: 3 mmol/L — ABNORMAL LOW (ref 3.5–5.1)
Sodium: 134 mmol/L — ABNORMAL LOW (ref 135–145)
TCO2: 23 mmol/L (ref 22–32)
pCO2 arterial: 35 mmHg (ref 32–48)
pH, Arterial: 7.406 (ref 7.35–7.45)
pO2, Arterial: 111 mmHg — ABNORMAL HIGH (ref 83–108)

## 2022-06-22 LAB — BASIC METABOLIC PANEL
Anion gap: 10 (ref 5–15)
BUN: 9 mg/dL (ref 8–23)
CO2: 20 mmol/L — ABNORMAL LOW (ref 22–32)
Calcium: 8.1 mg/dL — ABNORMAL LOW (ref 8.9–10.3)
Chloride: 100 mmol/L (ref 98–111)
Creatinine, Ser: 0.88 mg/dL (ref 0.61–1.24)
GFR, Estimated: >60 mL/min (ref >60)
Glucose, Bld: 162 mg/dL — ABNORMAL HIGH (ref 70–99)
Potassium: 3 mmol/L — ABNORMAL LOW (ref 3.5–5.1)
Sodium: 130 mmol/L — ABNORMAL LOW (ref 135–145)

## 2022-06-22 LAB — ECHOCARDIOGRAM COMPLETE
AR max vel: 2.36 cm2
AV Peak grad: 3.5 mmHg
Ao pk vel: 0.94 m/s
Area-P 1/2: 5.02 cm2
Height: 65 in
S' Lateral: 1.9 cm
Weight: 1851.86 oz

## 2022-06-22 LAB — GLUCOSE, CAPILLARY
Glucose-Capillary: 114 mg/dL — ABNORMAL HIGH (ref 70–99)
Glucose-Capillary: 128 mg/dL — ABNORMAL HIGH (ref 70–99)
Glucose-Capillary: 133 mg/dL — ABNORMAL HIGH (ref 70–99)
Glucose-Capillary: 137 mg/dL — ABNORMAL HIGH (ref 70–99)
Glucose-Capillary: 144 mg/dL — ABNORMAL HIGH (ref 70–99)
Glucose-Capillary: 144 mg/dL — ABNORMAL HIGH (ref 70–99)

## 2022-06-22 LAB — PHOSPHORUS
Phosphorus: 2.5 mg/dL (ref 2.5–4.6)
Phosphorus: 2.1 mg/dL — ABNORMAL LOW (ref 2.5–4.6)
Phosphorus: 2 mg/dL — ABNORMAL LOW (ref 2.5–4.6)

## 2022-06-22 LAB — MAGNESIUM
Magnesium: 1.5 mg/dL — ABNORMAL LOW (ref 1.7–2.4)
Magnesium: 2.5 mg/dL — ABNORMAL HIGH (ref 1.7–2.4)
Magnesium: 2.1 mg/dL (ref 1.7–2.4)

## 2022-06-22 MED ORDER — POTASSIUM CHLORIDE 20 MEQ PO PACK
20.0000 meq | PACK | ORAL | Status: AC
  Administered 2022-06-22 (×2): 20 meq
  Filled 2022-06-22 (×2): qty 1

## 2022-06-22 MED ORDER — VITAL AF 1.2 CAL PO LIQD
1000.0000 mL | ORAL | Status: DC
  Administered 2022-06-22: 1000 mL

## 2022-06-22 MED ORDER — MAGNESIUM SULFATE 4 GM/100ML IV SOLN
4.0000 g | Freq: Once | INTRAVENOUS | Status: DC
  Administered 2022-06-22: 4 g via INTRAVENOUS
  Filled 2022-06-22: qty 100

## 2022-06-22 MED ORDER — SODIUM CHLORIDE 0.9 % IV SOLN: INTRAVENOUS | Status: DC

## 2022-06-22 MED ORDER — PYRIDOSTIGMINE BROMIDE 60 MG PO TABS
60.0000 mg | ORAL_TABLET | Freq: Three times a day (TID) | ORAL | Status: DC
  Administered 2022-06-22 – 2022-06-23 (×3): 60 mg
  Filled 2022-06-22 (×3): qty 1

## 2022-06-22 MED ORDER — PROSOURCE TF20 ENFIT COMPATIBL EN LIQD
60.0000 mL | Freq: Every day | ENTERAL | Status: DC
  Administered 2022-06-22: 60 mL
  Filled 2022-06-22: qty 60

## 2022-06-22 MED ORDER — PREDNISONE 10 MG PO TABS
10.0000 mg | ORAL_TABLET | Freq: Every day | ORAL | Status: DC
  Administered 2022-06-23: 10 mg
  Filled 2022-06-22: qty 1

## 2022-06-22 MED ORDER — VITAL HIGH PROTEIN PO LIQD
1000.0000 mL | ORAL | Status: DC
  Filled 2022-06-22: qty 1000

## 2022-06-22 MED ORDER — POTASSIUM CHLORIDE 10 MEQ/100ML IV SOLN
10.0000 meq | INTRAVENOUS | Status: AC
  Administered 2022-06-22 (×4): 10 meq via INTRAVENOUS
  Filled 2022-06-22 (×4): qty 100

## 2022-06-22 NOTE — Progress Notes (Signed)
NAME:  [name], MRN:  [id number], DOB:  [birth year], LOS: 1 ADMISSION DATE:  06/21/2022, CONSULTATION DATE:  5/22 REFERRING MD:  Karene Fry, CHIEF COMPLAINT:  SOB   History of Present Illness:  67 year old man w/ hx of MG and recent admit for flare presenting to ER for worsening SOB.  Low NIF in ER with extreme tachypnea so intubated for airway protection.  No infectious prodrome, seems to have started just after discharged 2 days ago.  History per wife at bedside.  Neurology is following.  Pertinent  Medical History  MG DM2 Anxiety HTN Significant Hospital Events: Including procedures, antibiotic start and stop dates in addition to other pertinent events   5/22 admit 5/23 given Mg for replenishment, which exacerbated his MG symptoms. Extubation postponed.  Interim History / Subjective:  See above  Objective   Blood pressure 105/79, pulse 75, temperature 98.9 F (37.2 C), temperature source Oral, resp. rate 16, height 5\' 5"  (1.651 m), weight 52.5 kg, SpO2 100 %.    Vent Mode: PRVC FiO2 (%):  [40 %-100 %] 40 % Set Rate:  [16 bmp] 16 bmp Vt Set:  [490 mL] 490 mL PEEP:  [5 cmH20] 5 cmH20 Plateau Pressure:  [14 cmH20-16 cmH20] 15 cmH20   Intake/Output Summary (Last 24 hours) at 06/22/2022 1146 Last data filed at 06/22/2022 1000 Gross per 24 hour  Intake 2360.09 ml  Output 1155 ml  Net 1205.09 ml   Filed Weights   06/21/22 2000 06/22/22 0500  Weight: 51.1 kg 52.5 kg    Examination: General: no distress intubated/sedated on vent HENT: ETT in place minimal secretions Lungs: clear, no wheezing, passive on vent Cardiovascular: normal S1 S2, no mmrs rubs or gallops, appears well perfused Abdomen: soft, +BS Extremities: no edema Neuro: responds to comands and moves all extremities equal and appropriately. PEARL, EOM intact, has bilateral ptosis.   Resolved Hospital Problem list   N/A  Assessment & Plan:  Acute hypoxemic and hypercarbic respiratory failure due to  recurrent MG crisis with unclear trigger. -continue sedation with RASS guided titration -continue on ventilator/intubation for airway protection until Mg effect wears off for airway protection  -PAD + Vent bundle -resumption of home MG medications and IVIG vs PLEX per neuro recs  DM2 without hyperglycemia -Sliding Scale insulin  GERD Vent bundle  Elevation of head of bed  Best Practice (right click and "Reselect all SmartList Selections" daily)   Diet/type: NPO DVT prophylaxis: prophylactic heparin  GI prophylaxis: PPI Lines: N/A Foley:  Yes, and it is still needed Code Status:  full code Last date of multidisciplinary goals of care discussion [wife updated at bedside]  Labs   CBC: Recent Labs  Lab 06/16/22 0722 06/21/22 1605 06/21/22 1943 06/22/22 0426 06/22/22 0459  WBC 9.0 6.0  --   --  8.9  NEUTROABS 4.6 3.8  --   --   --   HGB 14.3 14.2 11.6* 11.2* 11.4*  HCT 41.9 40.4 34.0* 33.0* 31.7*  MCV 83.8 81.1  --   --  80.7  PLT 288 192  --   --  166    Basic Metabolic Panel: Recent Labs  Lab 06/16/22 0722 06/17/22 0435 06/21/22 0926 06/21/22 1605 06/21/22 1943 06/22/22 0426 06/22/22 0459  NA 130* 128* 128* 129* 133* 134* 130*  K 3.5 3.6 4.1 3.9 3.0* 3.0* 3.0*  CL 92* 94* 94* 92*  --   --  100  CO2 27 27 29 25   --   --  20*  GLUCOSE 104* 90 150* 126*  --   --  162*  BUN 7* 7* 9 9  --   --  9  CREATININE 1.02 0.89 0.96 1.00  --   --  0.88  CALCIUM 9.1 8.6* 9.7 9.7  --   --  8.1*  MG  --   --   --   --   --   --  1.5*  PHOS  --   --   --   --   --   --  2.5   GFR: Estimated Creatinine Clearance: 62.1 mL/min (by C-G formula based on SCr of 0.88 mg/dL). Recent Labs  Lab 06/16/22 0722 06/21/22 1605 06/22/22 0459  WBC 9.0 6.0 8.9    Liver Function Tests: Recent Labs  Lab 06/16/22 0722  AST 21  ALT 15  ALKPHOS 35*  BILITOT 0.6  PROT 7.0  ALBUMIN 4.1   No results for input(s): "LIPASE", "AMYLASE" in the last 168 hours. No results for input(s):  "AMMONIA" in the last 168 hours.  ABG    Component Value Date/Time   PHART 7.406 06/22/2022 0426   PCO2ART 35.0 06/22/2022 0426   PO2ART 111 (H) 06/22/2022 0426   HCO3 22.0 06/22/2022 0426   TCO2 23 06/22/2022 0426   ACIDBASEDEF 2.0 06/22/2022 0426   O2SAT 98 06/22/2022 0426     Coagulation Profile: No results for input(s): "INR", "PROTIME" in the last 168 hours.  Cardiac Enzymes: No results for input(s): "CKTOTAL", "CKMB", "CKMBINDEX", "TROPONINI" in the last 168 hours.  HbA1C: Hemoglobin A1C  Date/Time Value Ref Range Status  02/03/2022 08:31 AM 5.9 (A) 4.0 - 5.6 % Final  05/02/2021 10:16 AM 5.9 (A) 4.0 - 5.6 % Final   Hgb A1c MFr Bld  Date/Time Value Ref Range Status  06/16/2022 07:22 AM 5.7 (H) 4.8 - 5.6 % Final    Comment:    (NOTE) Pre diabetes:          5.7%-6.4%  Diabetes:              >6.4%  Glycemic control for   <7.0% adults with diabetes   09/17/2017 09:47 PM 5.9 (H) 4.8 - 5.6 % Final    Comment:    (NOTE) Pre diabetes:          5.7%-6.4% Diabetes:              >6.4% Glycemic control for   <7.0% adults with diabetes     CBG: Recent Labs  Lab 06/21/22 2032 06/21/22 2348 06/22/22 0346 06/22/22 0749 06/22/22 1118  GLUCAP 188* 105* 144* 128* 114*    Review of Systems:   Intubated/sedated  Past Medical History:  He,  has a past medical history of Anxiety, Colon polyp, Depressive disorder, not elsewhere classified, External hemorrhoid, Insomnia, unspecified, Internal hemorrhoids, Myasthenia gravis without exacerbation (HCC), Other and unspecified hyperlipidemia, 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   Surgical History:   Past Surgical History:  Procedure Laterality Date   THYMECTOMY       Social History:   reports that he  has never smoked. He has never used smokeless tobacco. He reports that he does not drink alcohol and does not use drugs.   Family History:  His family history is not on file.   Allergies Allergies  Allergen  Reactions   Azithromycin Other (See Comments)    REACTION: exac of his mg   Beta Adrenergic Blockers Other (See Comments)    REACTION: exac of mg     Home Medications  Prior to Admission medications   Medication Sig Start Date End Date Taking? Authorizing Provider  ampicillin (PRINCIPEN) 500 MG capsule Take by mouth. 08/07/20   [provider]  calcium carbonate (OS-CAL) 600 MG TABS Take 600 mg by mouth 3 (three) times daily with meals.    [provider]  Cholecalciferol (VITAMIN D) 2000 UNITS tablet Take 2,000 Units by mouth daily.    [provider]  colesevelam (WELCHOL) 625 MG tablet Take 3 tablets (1,875 mg total) by mouth daily. 05/24/22   Shamleffer, Konrad Dolores, MD  desonide (DESONATE) 0.05 % gel Apply 1 application topically 2 (two) times daily as needed.     [provider]  entecavir (BARACLUDE) 1 MG tablet Take 1 tablet (1 mg total) by mouth daily. 07/13/11   Brooke Dare, MD  fluocinonide ointment (LIDEX) 0.05 % APPLY TO AFFECTED AREA(S) OF THE SKIN daily AS NEEDED. By dermatologist. 10/04/21   [place], [name], MD  FLUoxetine (PROZAC) 20 MG capsule TAKE ONE CAPSULE BY MOUTH DAILY 01/25/22   [place], [name], MD  glucose blood (ONETOUCH ULTRA) test strip USE TO TEST BLOOD SUGAR 3 TIMES DAILY 12/10/20   Romero Belling, MD  ibuprofen (ADVIL) 200 MG tablet Take 400 mg by mouth every 8 (eight) hours as needed for headache or mild pain.    [provider]  Lancets [facility] ULTRASOFT) lancets USE AS INSTRUCTED TO CHECK BLOOD SUGARS THREE TIMES A DAY 01/13/19   Romero Belling, MD  metFORMIN (GLUCOPHAGE) 500 MG tablet Take 2 tablets (1,000 mg total) by mouth 2 (two) times daily. 02/03/22   Shamleffer, Konrad Dolores, MD  olmesartan  (BENICAR) 20 MG tablet TAKE ONE TABLET BY MOUTH DAILY *DISCONTINUE DILITIAZEM* 05/28/22   Shamleffer, Konrad Dolores, MD  omeprazole (PRILOSEC) 20 MG capsule TAKE 1 CAPSULE BY MOUTH DAILY 05/29/22   [place], [name], MD  pimecrolimus (ELIDEL) 1 % cream Apply 1 application topically 2 (two) times daily as needed.  01/01/13   [provider]  predniSONE (DELTASONE) 5 MG tablet Take 10 mg by mouth as directed.    [provider]  pyridostigmine (MESTINON) 60 MG tablet Take 60 mg by mouth 3 (three) times daily.    [provider]  repaglinide (PRANDIN) 0.5 MG tablet Take 1 tablet (0.5 mg total) by mouth daily before supper. Patient taking differently: Take 0.5 mg by mouth daily as needed (when BG > 200). 02/03/22   Shamleffer, Konrad Dolores, MD  sitaGLIPtin (JANUVIA) 100 MG tablet Take 1 tablet (100 mg total) by mouth daily. 02/03/22   Shamleffer, Konrad Dolores, MD  zolpidem (AMBIEN) 10 MG tablet TAKE 1/2 TO 1 TABLET (0.5-1) BY MOUTH EVERY NIGHT AT BEDTIME AS NEEDED 05/29/22   [place], [name], MD     Critical care time: 30 minutes    Windy Carina, MS4

## 2022-06-22 NOTE — Progress Notes (Signed)
Neurology Progress Note  Brief HPI: [name] is a 67 y.o. male with a past medical history of eropositive myasthenia gravis, malignant thymoma s/p radiation and resection in 2003,  PTSD, DM2, hepatitis B, anxiety, depression, and insomnia. He follows with Dr. Jeremy Johann at [facility] and saw her last on 06/08/2022. He has received IVIG and PLEX in the past for exacerbations, both with therapeutic effect. At his visit on 5/9 he stated that he felt he is weak all over with head drop, right arm weakness, SOB, chewing difficulty. She increased his prednisone to 10mg  daily and he was admitted to [facility] from 5/17- 5/20 after receiving IVIG 400mg /kg given on 5/17 and then 800mg /kg on 5/18 and 5/19. He felt that he initially improved and then started having trouble again on Tuesday. His wife also states that he started having shortness of breath on Tuesday and then was seen by his PCP this morning and recommended to go to the ED due to worsening SOB and difficulty swallowing. He was intubated in the ED  Subjective: Patient seen in room. Remains intubated. Received 4g of IV mag overnight. Plan to keep him intubated today and then reassess tomorrow for extubation. Following commands while on propofol. Appears comfortable.   Exam: Vitals:   06/22/22 0645 06/22/22 0700  BP: 114/84 107/70  Pulse: 85 87  Resp: 16 16  Temp:    SpO2: 100% 100%   Gen: In bed, NAD Resp: non-labored breathing, no acute distress Abd: soft, nt  Neuro: Mental Status: Intubated, nods appropriately and following simple commands Cranial Nerves: II: Visual Fields are full. PERRL.   III,IV, VI: resists forced eye opening. Eyes midline on exam, EOM intact V: Facial sensation is symmetric to temperature VII: Facial movement is symmetric resting and smiling VIII: Hearing is intact to voice X: cough and gag intact XI: Shoulder shrug is symmetric. XII: Tongue protrudes midline without atrophy or fasciculations.  Motor: Tone  is normal. Bulk is normal. Elevates extremities antigravity, hand grasp equal Sensory: Sensation is symmetric to light touch  Cerebellar: Does not complete Gait: steady or deferred for safety    Pertinent Labs: K 3.0 - replaced Mag 1.5 - given 4g IV mag on 5/23  Assessment:  This is an extremely complicated gentleman with hx seropositive myasthenia gravis recently admitted for exacerbation. He improved on IVIG and was discharged to home 3 days ago, at which point his respiratory function was vastly improved although he did have some residual ptosis. He now presents 3 days after discharge with respiratory distress and NIF -19. Both his inpatient and outpatient neurologists have raised the concerns that some of his symptoms and exacerbations may be more somatoform in etiology 2/2 his severe anxiety 2/2 PTSD may be playing a role. It is very surprising that he would respond to IVIG last week and then deteriorate so quickly at home. (IVIG keeps working after the last infusion and patient's continue to experience improvement for a few weeks after infusion). The 2 primary options for acute treatment if he is having recurrent flare would be repeat IVIG and PLEX. There is not excellent data to support repeat treatment with IVIG but it may be warranted in this gentleman. The other option would be PLEX, however this is rarely done after IVIG since PLEX removes the circulating IVIG and obviates any potential subsequent benefit from it. Regardless, neither of those treatments will improve his condition overnight (they take 2-3 days to show effect). Given concern that his presentation may have  other contributing factors rather than true MG flare, we recommend weaning him to extubation tomorrow so that we can comprehensively exam him. Will wait till tomorrow since he received IV mag overnight.   Impression:  MG Exacerbation  Recommendations: - Vent management per PCCM - Plan to attempt sedation wean to  extubation tomorrow. D/w Dr. Craige Cotta. Neurology will re-evaluate after extubation. - No PLEX or IVIG for now - Continue home prednisone - Continue home mestinon 60mg  tid - NPO after extubation until swallow eval by SLP  - Medications that may worsen or trigger MG exacerbation: Class IA antiarrhythmics, magnesium, flouroquinolones, macrolides, aminoglycosides, penicillamine, curare, interferon alpha, botox, quinine. Use with caution: calcium channel blocker, beta blockers and statins.   Patient seen and examined by NP/APP with MD. MD to update note as needed.   Elmer Picker, DNP, FNP-BC [facility] Pager: [phone]   Attending Neurohospitalist Addendum Patient seen and examined with APP/Resident. Agree with the history and physical as documented above. Agree with the plan as documented, which I helped formulate. I have edited the note above to reflect my full findings and recommendations. I have independently reviewed the chart, obtained history, review of systems and examined the patient.I have personally reviewed pertinent head/neck/spine imaging (CT/MRI). Please feel free to call with any questions.  This patient is critically ill and at significant risk of neurological worsening, death and care requires constant monitoring of vital signs, hemodynamics,respiratory and cardiac monitoring, neurological assessment, discussion with family, other specialists and medical decision making of high complexity. I spent 50 minutes of neurocritical care time  in the care of  this patient. This was time spent independent of any time provided by nurse practitioner or PA.  Bing Neighbors, MD [facility] [phone]  If 7pm- 7am, please page neurology on call as listed in AMION.

## 2022-06-22 NOTE — Plan of Care (Signed)
Call from neuro NP re pt plan of care  Seems pt was ordered mag overnight and has received at least part of 4g ivpb  Will plan to defer eval for extubation in this setting    Tessie Fass MSN, AGACNP-BC [facility] Pulmonary/Critical Care Medicine 06/22/2022, 8:33 AM

## 2022-06-22 NOTE — Progress Notes (Signed)
An USGPIV (ultrasound guided PIV) has been placed for short-term vasopressor infusion. A correctly placed ivWatch must be used when administering vasopressors. Should this treatment be needed beyond 72 hours, central line access should be obtained.  It will be the responsibility of the bedside nurse to follow best practice to prevent extravasations.   

## 2022-06-22 NOTE — Progress Notes (Signed)
Echocardiogram 2D Echocardiogram has been performed.  [name] 06/22/2022, 4:09 PM

## 2022-06-22 NOTE — Progress Notes (Signed)
[facility] ADULT ICU REPLACEMENT PROTOCOL   The patient does apply for the [facility] Adult ICU Electrolyte Replacment Protocol based on the criteria listed below:   1.Exclusion criteria: TCTS, ECMO, Dialysis, and Myasthenia Gravis patients 2. Is GFR >/= 30 ml/min? Yes.    Patient's GFR today is >60 3. Is SCr </= 2? Yes.   Patient's SCr is 0.88 mg/dL 4. Did SCr increase >/= 0.5 in 24 hours? No. 5.Pt's weight >40kg  Yes.   6. Abnormal electrolyte(s): potassium 3.0, mag 1.5  7. Electrolytes replaced per protocol 8.  Call MD STAT for K+ </= 2.5, Phos </= 1, or Mag </= 1 Physician:  protocol  Melvern Banker 06/22/2022 6:16 AM

## 2022-06-23 ENCOUNTER — Inpatient Hospital Stay (HOSPITAL_COMMUNITY): Payer: Medicare Other

## 2022-06-23 DIAGNOSIS — J9602 Acute respiratory failure with hypercapnia: Secondary | ICD-10-CM | POA: Diagnosis not present

## 2022-06-23 DIAGNOSIS — J9601 Acute respiratory failure with hypoxia: Secondary | ICD-10-CM | POA: Diagnosis not present

## 2022-06-23 DIAGNOSIS — J9600 Acute respiratory failure, unspecified whether with hypoxia or hypercapnia: Secondary | ICD-10-CM | POA: Diagnosis not present

## 2022-06-23 DIAGNOSIS — G7001 Myasthenia gravis with (acute) exacerbation: Secondary | ICD-10-CM | POA: Diagnosis not present

## 2022-06-23 DIAGNOSIS — J988 Other specified respiratory disorders: Secondary | ICD-10-CM | POA: Diagnosis not present

## 2022-06-23 DIAGNOSIS — E43 Unspecified severe protein-calorie malnutrition: Secondary | ICD-10-CM | POA: Insufficient documentation

## 2022-06-23 LAB — POCT I-STAT 7, (LYTES, BLD GAS, ICA,H+H)
Acid-base deficit: 3 mmol/L — ABNORMAL HIGH (ref 0.0–2.0)
Acid-base deficit: 5 mmol/L — ABNORMAL HIGH (ref 0.0–2.0)
Bicarbonate: 24.6 mmol/L (ref 20.0–28.0)
Bicarbonate: 20.6 mmol/L (ref 20.0–28.0)
Calcium, Ion: 1.19 mmol/L (ref 1.15–1.40)
Calcium, Ion: 1.1 mmol/L — ABNORMAL LOW (ref 1.15–1.40)
HCT: 40 % (ref 39.0–52.0)
HCT: 37 % — ABNORMAL LOW (ref 39.0–52.0)
Hemoglobin: 13.6 g/dL (ref 13.0–17.0)
Hemoglobin: 12.6 g/dL — ABNORMAL LOW (ref 13.0–17.0)
O2 Saturation: 96 %
O2 Saturation: 100 %
Patient temperature: 97.8
Patient temperature: 97.8
Potassium: 3.5 mmol/L (ref 3.5–5.1)
Potassium: 3.8 mmol/L (ref 3.5–5.1)
Sodium: 132 mmol/L — ABNORMAL LOW (ref 135–145)
Sodium: 132 mmol/L — ABNORMAL LOW (ref 135–145)
TCO2: 26 mmol/L (ref 22–32)
TCO2: 22 mmol/L (ref 22–32)
pCO2 arterial: 53.8 mmHg — ABNORMAL HIGH (ref 32–48)
pCO2 arterial: 38.3 mmHg (ref 32–48)
pH, Arterial: 7.266 — ABNORMAL LOW (ref 7.35–7.45)
pH, Arterial: 7.336 — ABNORMAL LOW (ref 7.35–7.45)
pO2, Arterial: 94 mmHg (ref 83–108)
pO2, Arterial: 486 mmHg — ABNORMAL HIGH (ref 83–108)

## 2022-06-23 LAB — PHOSPHORUS
Phosphorus: 3.1 mg/dL (ref 2.5–4.6)
Phosphorus: 2.8 mg/dL (ref 2.5–4.6)

## 2022-06-23 LAB — BASIC METABOLIC PANEL
Anion gap: 9 (ref 5–15)
BUN: 9 mg/dL (ref 8–23)
CO2: 25 mmol/L (ref 22–32)
Calcium: 8.2 mg/dL — ABNORMAL LOW (ref 8.9–10.3)
Chloride: 99 mmol/L (ref 98–111)
Creatinine, Ser: 0.97 mg/dL (ref 0.61–1.24)
GFR, Estimated: >60 mL/min (ref >60)
Glucose, Bld: 148 mg/dL — ABNORMAL HIGH (ref 70–99)
Potassium: 3.5 mmol/L (ref 3.5–5.1)
Sodium: 133 mmol/L — ABNORMAL LOW (ref 135–145)

## 2022-06-23 LAB — GLUCOSE, CAPILLARY
Glucose-Capillary: 116 mg/dL — ABNORMAL HIGH (ref 70–99)
Glucose-Capillary: 136 mg/dL — ABNORMAL HIGH (ref 70–99)
Glucose-Capillary: 159 mg/dL — ABNORMAL HIGH (ref 70–99)
Glucose-Capillary: 179 mg/dL — ABNORMAL HIGH (ref 70–99)
Glucose-Capillary: 242 mg/dL — ABNORMAL HIGH (ref 70–99)
Glucose-Capillary: 196 mg/dL — ABNORMAL HIGH (ref 70–99)

## 2022-06-23 LAB — MAGNESIUM
Magnesium: 2 mg/dL (ref 1.7–2.4)
Magnesium: 2 mg/dL (ref 1.7–2.4)

## 2022-06-23 LAB — TSH: TSH: 0.191 u[IU]/mL — ABNORMAL LOW (ref 0.350–4.500)

## 2022-06-23 MED ORDER — ROCURONIUM BROMIDE 10 MG/ML (PF) SYRINGE
PREFILLED_SYRINGE | INTRAVENOUS | Status: AC
  Filled 2022-06-23: qty 10

## 2022-06-23 MED ORDER — DILTIAZEM HCL-DEXTROSE 125-5 MG/125ML-% IV SOLN (PREMIX)
5.0000 mg/h | INTRAVENOUS | Status: DC
  Filled 2022-06-23: qty 125

## 2022-06-23 MED ORDER — PREDNISONE 10 MG PO TABS
10.0000 mg | ORAL_TABLET | Freq: Every day | ORAL | Status: DC
  Administered 2022-06-24: 10 mg via ORAL
  Filled 2022-06-23: qty 1

## 2022-06-23 MED ORDER — FENTANYL CITRATE PF 50 MCG/ML IJ SOSY: 50.0000 ug | PREFILLED_SYRINGE | INTRAMUSCULAR | Status: DC | PRN

## 2022-06-23 MED ORDER — PYRIDOSTIGMINE BROMIDE 60 MG PO TABS
60.0000 mg | ORAL_TABLET | Freq: Three times a day (TID) | ORAL | Status: DC
  Administered 2022-06-23 – 2022-06-27 (×12): 60 mg
  Filled 2022-06-23 (×11): qty 1

## 2022-06-23 MED ORDER — DEXMEDETOMIDINE HCL IN NACL 400 MCG/100ML IV SOLN
0.0000 ug/kg/h | INTRAVENOUS | Status: DC
  Administered 2022-06-23 – 2022-06-24 (×3): 0.5 ug/kg/h via INTRAVENOUS
  Administered 2022-06-25: 0.6 ug/kg/h via INTRAVENOUS
  Administered 2022-06-26: 0.5 ug/kg/h via INTRAVENOUS
  Administered 2022-06-27: 0.6 ug/kg/h via INTRAVENOUS
  Administered 2022-06-27: 0.4 ug/kg/h via INTRAVENOUS
  Administered 2022-06-27: 0.6 ug/kg/h via INTRAVENOUS
  Administered 2022-06-29: 0.4 ug/kg/h via INTRAVENOUS
  Administered 2022-06-29 – 2022-06-30 (×2): 1 ug/kg/h via INTRAVENOUS
  Filled 2022-06-23: qty 200
  Filled 2022-06-23 (×9): qty 100

## 2022-06-23 MED ORDER — MIDAZOLAM HCL 2 MG/2ML IJ SOLN
INTRAMUSCULAR | Status: AC
  Administered 2022-06-23: 2 mg via INTRAVENOUS
  Filled 2022-06-23: qty 2

## 2022-06-23 MED ORDER — SODIUM CHLORIDE 0.9 % IV SOLN
250.0000 mL | INTRAVENOUS | Status: DC
  Administered 2022-06-26: 250 mL via INTRAVENOUS

## 2022-06-23 MED ORDER — LORAZEPAM 2 MG/ML IJ SOLN
INTRAMUSCULAR | Status: AC
  Filled 2022-06-23: qty 1

## 2022-06-23 MED ORDER — HYDRALAZINE HCL 20 MG/ML IJ SOLN
INTRAMUSCULAR | Status: AC
  Administered 2022-06-23: 10 mg via INTRAVENOUS
  Filled 2022-06-23: qty 1

## 2022-06-23 MED ORDER — FAMOTIDINE 20 MG PO TABS
20.0000 mg | ORAL_TABLET | Freq: Two times a day (BID) | ORAL | Status: DC
  Administered 2022-06-23 – 2022-06-30 (×15): 20 mg
  Filled 2022-06-23 (×14): qty 1

## 2022-06-23 MED ORDER — ETOMIDATE 2 MG/ML IV SOLN: 20.0000 mg | Freq: Once | INTRAVENOUS | Status: AC

## 2022-06-23 MED ORDER — ETOMIDATE 2 MG/ML IV SOLN
INTRAVENOUS | Status: AC
  Administered 2022-06-23: 20 mg via INTRAVENOUS
  Filled 2022-06-23: qty 20

## 2022-06-23 MED ORDER — IMMUNE GLOBULIN (HUMAN) 20 GM/200ML IV SOLN
20.0000 g | INTRAVENOUS | Status: AC
  Administered 2022-06-23: 10 g via INTRAVENOUS
  Administered 2022-06-24 – 2022-06-27 (×4): 20 g via INTRAVENOUS
  Filled 2022-06-23 (×11): qty 200

## 2022-06-23 MED ORDER — FENTANYL CITRATE PF 50 MCG/ML IJ SOSY: 50.0000 ug | PREFILLED_SYRINGE | Freq: Once | INTRAMUSCULAR | Status: AC

## 2022-06-23 MED ORDER — MIDAZOLAM HCL 2 MG/2ML IJ SOLN: 2.0000 mg | Freq: Once | INTRAMUSCULAR | Status: AC

## 2022-06-23 MED ORDER — FENTANYL CITRATE PF 50 MCG/ML IJ SOSY
PREFILLED_SYRINGE | INTRAMUSCULAR | Status: AC
  Administered 2022-06-23: 50 ug via INTRAVENOUS
  Filled 2022-06-23: qty 2

## 2022-06-23 MED ORDER — SUCCINYLCHOLINE CHLORIDE 200 MG/10ML IV SOSY: 100.0000 mg | PREFILLED_SYRINGE | Freq: Once | INTRAVENOUS | Status: AC

## 2022-06-23 MED ORDER — NOREPINEPHRINE 4 MG/250ML-% IV SOLN
INTRAVENOUS | Status: AC
  Administered 2022-06-23: 10 ug/min via INTRAVENOUS
  Filled 2022-06-23: qty 250

## 2022-06-23 MED ORDER — VITAL AF 1.2 CAL PO LIQD
1000.0000 mL | ORAL | Status: DC
  Administered 2022-06-23 – 2022-07-05 (×9): 1000 mL
  Filled 2022-06-23 (×9): qty 1000

## 2022-06-23 MED ORDER — PYRIDOSTIGMINE BROMIDE 60 MG PO TABS
60.0000 mg | ORAL_TABLET | Freq: Three times a day (TID) | ORAL | Status: DC
  Filled 2022-06-23: qty 1

## 2022-06-23 MED ORDER — HYDRALAZINE HCL 20 MG/ML IJ SOLN
10.0000 mg | Freq: Four times a day (QID) | INTRAMUSCULAR | Status: DC | PRN
  Administered 2022-06-26 – 2022-06-30 (×4): 10 mg via INTRAVENOUS
  Filled 2022-06-23 (×5): qty 1

## 2022-06-23 MED ORDER — SUCCINYLCHOLINE CHLORIDE 200 MG/10ML IV SOSY
PREFILLED_SYRINGE | INTRAVENOUS | Status: AC
  Administered 2022-06-23: 100 mg via INTRAVENOUS
  Filled 2022-06-23: qty 10

## 2022-06-23 MED ORDER — NOREPINEPHRINE 4 MG/250ML-% IV SOLN
2.0000 ug/min | INTRAVENOUS | Status: DC
  Administered 2022-06-24 (×2): 6 ug/min via INTRAVENOUS
  Administered 2022-06-27 (×2): 2 ug/min via INTRAVENOUS
  Filled 2022-06-23 (×5): qty 250

## 2022-06-23 MED ORDER — DOCUSATE SODIUM 50 MG/5ML PO LIQD
100.0000 mg | Freq: Two times a day (BID) | ORAL | Status: DC
  Administered 2022-06-27: 100 mg
  Filled 2022-06-23: qty 10

## 2022-06-23 MED ORDER — LORAZEPAM 2 MG/ML IJ SOLN
1.0000 mg | Freq: Once | INTRAMUSCULAR | Status: AC
  Administered 2022-06-23: 1 mg via INTRAVENOUS

## 2022-06-23 MED ORDER — DILTIAZEM LOAD VIA INFUSION
10.0000 mg | Freq: Once | INTRAVENOUS | Status: DC
  Filled 2022-06-23: qty 10

## 2022-06-23 NOTE — Progress Notes (Signed)
Tachycardia and hypertensive after extubation.  C/o dyspnea.  Some improvement with Bipap but HR and BP still up.  Will start on cardizem gtt.  Avoid beta blocker in setting of myasthenia.  Coralyn Helling, MD [facility] Pulmonary/Critical Care Pager - [phone] or [phone] 06/23/2022, 12:59 PM

## 2022-06-23 NOTE — Progress Notes (Signed)
Initial Nutrition Assessment  DOCUMENTATION CODES:   Severe malnutrition in context of acute illness/injury  INTERVENTION:   Initiate tube feeding via Cortrak tube: Vital AF 1.2 at 25 ml/h and increase by 10 ml every 8 hours to goal rate of 55 ml/hr (1320 ml per day)  Provides 1584 kcal, 99 gm protein, 1070 ml free water daily  Monitor magnesium and phosphorus every 12 hours x 4 occurrences, MD to replete as needed, as pt is at risk for refeeding syndrome given pt meets criteria for severe acute malnutrition.  NUTRITION DIAGNOSIS:   Severe Malnutrition related to acute illness (MG flare) as evidenced by moderate fat depletion, moderate muscle depletion, percent weight loss.  GOAL:   Patient will meet greater than or equal to 90% of their needs  MONITOR:   Diet advancement, TF tolerance  REASON FOR ASSESSMENT:   Consult Enteral/tube feeding initiation and management  ASSESSMENT:   Pt with PMH of MG, DM, anxiety, and HTN admitted for MG flare.    Pt discussed during ICU rounds and with RN and MD.  Pt extubated this am but with weak cough and hx of dysphagia PTA. Cortrak placed. Per MD may need BiPAP.   Spoke with pt's daughter and wife. They report pt has had weight loss over the last 5 weeks. Wife reports pt did not tell her but he had had trouble swallowing for the last 5 weeks.  Usual weight: 127 lb  Current weight: 115 lb  9% weight loss x 5 weeks  05/22 - admitted  05/24 - extubated; s/p cortrak placement; distal stomach per xray   Medications reviewed and include: SSI, miralax, prednisone NS @ 40 ml/hr  Labs reviewed:  Na 133 CBG's: 116-159    NUTRITION - FOCUSED PHYSICAL EXAM:  Flowsheet Row Most Recent Value  Orbital Region No depletion  Upper Arm Region Moderate depletion  Thoracic and Lumbar Region Moderate depletion  Buccal Region No depletion  Temple Region No depletion  Clavicle Bone Region No depletion  Clavicle and Acromion Bone Region  Mild depletion  Scapular Bone Region Unable to assess  Dorsal Hand Mild depletion  Patellar Region Moderate depletion  Anterior Thigh Region Moderate depletion  Posterior Calf Region Moderate depletion  Edema (RD Assessment) None  Hair Reviewed  Eyes Reviewed  Mouth Reviewed  Skin Reviewed  Nails Reviewed       Diet Order:   Diet Order             Diet NPO time specified  Diet effective now                   EDUCATION NEEDS:   No education needs have been identified at this time  Skin:  Skin Assessment: Reviewed RN Assessment  Last BM:  5/24 large  Height:   Ht Readings from Last 1 Encounters:  06/21/22 5\' 5"  (1.651 m)    Weight:   Wt Readings from Last 1 Encounters:  06/23/22 52.7 kg   BMI:  Body mass index is 19.33 kg/m.  Estimated Nutritional Needs:   Kcal:  1500-1700  Protein:  80-100 grams  Fluid:  >1.7 L/day  Cammy Copa., RD, LDN, CNSC See AMiON for contact information

## 2022-06-23 NOTE — Progress Notes (Signed)
Neurology Progress Note  Brief HPI: [name] is a 67 y.o. male with a past medical history of eropositive myasthenia gravis, malignant thymoma s/p radiation and resection in 2003,  PTSD, DM2, hepatitis B, anxiety, depression, and insomnia. He follows with Dr. Jeremy Johann at [facility] and saw her last on 06/08/2022. He has received IVIG and PLEX in the past for exacerbations, both with therapeutic effect. At his visit on 5/9 he stated that he felt he is weak all over with head drop, right arm weakness, SOB, chewing difficulty. She increased his prednisone to 10mg  daily and he was admitted to [facility] from 5/17- 5/20 after receiving IVIG 400mg /kg given on 5/17 and then 800mg /kg on 5/18 and 5/19. He felt that he initially improved and then started having trouble again on Tuesday. His wife also states that he started having shortness of breath on Tuesday and then was seen by his PCP this morning and recommended to go to the ED due to worsening SOB and difficulty swallowing. He was intubated in the ED  Subjective: Patient was extubated today after which he became increasingly agitated, tachycardic, tachypneic, and hypertensive. Subjectively dyspneic. No improvement with bipap. ABG with mild hypercarbia. He was reintubated.  Exam prior to extubation:  Vitals:   06/23/22 1845 06/23/22 1900  BP: 108/79 116/79  Pulse: (!) 113 (!) 112  Resp: (!) 22 (!) 22  Temp:    SpO2: 99% 99%   Gen: In bed, NAD Resp: non-labored breathing, no acute distress Abd: soft, nt  Neuro: Mental Status: Intubated, nods appropriately and following simple commands Cranial Nerves: II: Visual Fields are full. PERRL.   III,IV, VI: resists forced eye opening. Eyes midline on exam, EOM intact V: Facial sensation is symmetric to temperature VII: Facial movement is symmetric resting and smiling VIII: Hearing is intact to voice X: cough and gag intact XI: Shoulder shrug is symmetric. XII: Tongue protrudes midline without  atrophy or fasciculations.  Motor: Tone is normal. Bulk is normal. Elevates extremities antigravity, hand grasp equal Sensory: Sensation is symmetric to light touch  Cerebellar: Does not complete  Pertinent Labs: K 3.0 - replaced Mag 1.5 - given 4g IV mag on 5/23  Assessment:  This is an extremely complicated gentleman with hx seropositive myasthenia gravis recently admitted for exacerbation. He improved on IVIG and was discharged to home 3 days ago, at which point his respiratory function was vastly improved although he did have some residual ptosis. He now presents 3 days after discharge with respiratory distress and NIF -19. Both his inpatient and outpatient neurologists have raised the concerns that some of his symptoms and exacerbations may be more somatoform in etiology 2/2 his severe anxiety 2/2 PTSD may be playing a role. It is very surprising that he would respond to IVIG last week and then deteriorate so quickly at home. (IVIG keeps working after the last infusion and patient's continue to experience improvement for a few weeks after infusion). The 2 primary options for acute treatment if he is having recurrent flare would be repeat IVIG and PLEX. There is not excellent data to support repeat treatment with IVIG but it may be warranted in this gentleman. The other option would be PLEX, however this is rarely done after IVIG since PLEX removes the circulating IVIG and obviates any potential subsequent benefit from it. Regardless, neither of those treatments will improve his condition overnight (they take 2-3 days to show effect). Given concern that his presentation may have other contributing factors rather than  true MG flare, we recommended weaning him to extubation to allow a comprehensive examination. Unfortunately after he was extubated today he had to be reintubated for hypercarbic and hypoxic respiratory failure. Will proceed with second treatment course of IVIG.  Impression:  MG  Exacerbation  Recommendations: - Vent management per PCCM - IVIG 2g/kg divided over 5 days (end date 5/28) - Continue home prednisone - Continue home mestinon 60mg  tid  - Medications that may worsen or trigger MG exacerbation: Class IA antiarrhythmics, magnesium, flouroquinolones, macrolides, aminoglycosides, penicillamine, curare, interferon alpha, botox, quinine. Use with caution: calcium channel blocker, beta blockers and statins.  This patient is critically ill and at significant risk of neurological worsening, death and care requires constant monitoring of vital signs, hemodynamics,respiratory and cardiac monitoring, neurological assessment, discussion with family, other specialists and medical decision making of high complexity. I spent 70 minutes of neurocritical care time  in the care of  this patient. This was time spent independent of any time provided by nurse practitioner or PA.  Bing Neighbors, MD [facility] [phone]  If 7pm- 7am, please page neurology on call as listed in AMION.

## 2022-06-23 NOTE — Progress Notes (Signed)
SLP Cancellation Note  Patient Details Name: [name] MRN: [id number] DOB: 06-30-1955   Cancelled treatment:       Reason Eval/Treat Not Completed: Patient not medically ready. Cough not sufficient for swallow eval yet. Will f/u    Jaclyn Andy, [name] 06/23/2022, 12:17 PM

## 2022-06-23 NOTE — Progress Notes (Signed)
Progressive agitation, tachycardia, hypertension.  C/o he can't breath.  No improvement with Bipap.  ABG    Component Value Date/Time   PHART 7.266 (L) 06/23/2022 1354   PCO2ART 53.8 (H) 06/23/2022 1354   PO2ART 94 06/23/2022 1354   HCO3 24.6 06/23/2022 1354   TCO2 26 06/23/2022 1354   ACIDBASEDEF 3.0 (H) 06/23/2022 1354   O2SAT 96 06/23/2022 1354    D/w family.  Reintubated.  Add precedex for sedation.  Check TSH.  D/w Dr. Selina Cooley.  Neuro planning to start IVIG.  Additional CC time independent of procedure time 46 minutes.  Coralyn Helling, MD [facility] Pulmonary/Critical Care Pager - [phone] or [phone] 06/23/2022, 2:48 PM

## 2022-06-23 NOTE — Procedures (Signed)
Cortrak  Person Inserting Tube:  Arron Tetrault J, RD Tube Type:  Cortrak - 43 inches Tube Size:  10 Tube Location:  Left nare Secured by: Bridle Technique Used to Measure Tube Placement:  Marking at nare/corner of mouth Cortrak Secured At:  63 cm   Cortrak Tube Team Note:  Consult received to place a Cortrak feeding tube.   X-ray is required, abdominal x-ray has been ordered by the Cortrak team. Please confirm tube placement before using the Cortrak tube.   If the tube becomes dislodged please keep the tube and contact the Cortrak team at [email or web address] for replacement.  If after hours and replacement cannot be delayed, place a NG tube and confirm placement with an abdominal x-ray.    Kate Genessis Flanary, MS, RD, LDN Inpatient Clinical Dietitian Please see AMiON for contact information.  

## 2022-06-23 NOTE — Progress Notes (Signed)
NIF x3 done little effort from pt 1st attempt -10 cm H2O 2nd attempt -15 cm H2O 3rd attempt < -10 cm H2O

## 2022-06-23 NOTE — Procedures (Signed)
Extubation Procedure Note  Patient Details:   Name: [name] DOB: 08-27-1955 MRN: [id number]   Airway Documentation:  Airway 7.5 mm (Active)  Secured at (cm) 24 cm 06/23/22 0757  Measured From Lips 06/23/22 0757  Secured Location Center 06/23/22 0757  Secured By [employer] 06/23/22 0757  Tube Holder Repositioned Yes 06/23/22 0757  Prone position No 06/23/22 0757  Cuff Pressure (cm H2O) Green OR 18-26 [facility] 06/23/22 0757  Site Condition Dry 06/23/22 0757   Vent end date: (not recorded) Vent end time: (not recorded)   Evaluation  O2 sats: stable throughout Complications: No apparent complications Patient did tolerate procedure well. Bilateral Breath Sounds: Clear, Diminished   Yes Pt extubated to [place] w/o complications Positive Cuff Leak  [name] V 06/23/2022, 9:34 AM

## 2022-06-23 NOTE — Procedures (Signed)
Intubation Procedure Note  Aleck Weatherby  [id number]  [birth year]  Date:06/23/22  Time:2:46 PM   Provider Performing:Damarcus Reggio    Procedure: Intubation (31500)  Indication(s) Respiratory Failure  Consent Risks of the procedure as well as the alternatives and risks of each were explained to the patient and/or caregiver.  Consent for the procedure was obtained and is signed in the bedside chart   Anesthesia Etomidate, Versed, Fentanyl, and Succinylcholine   Time Out Verified patient identification, verified procedure, site/side was marked, verified correct patient position, special equipment/implants available, medications/allergies/relevant history reviewed, required imaging and test results available.   Sterile Technique Usual hand hygeine, masks, and gloves were used   Procedure Description Patient positioned in bed supine.  Sedation given as noted above.  Patient was intubated with endotracheal tube using Glidescope.  View was Grade 1 full glottis .  Number of attempts was 1.  Colorimetric CO2 detector was consistent with tracheal placement.   Complications/Tolerance None; patient tolerated the procedure well. Chest X-ray is ordered to verify placement.   EBL Minimal   Specimen(s) None  Coralyn Helling, MD [facility] Pulmonary/Critical Care Pager - [phone] or [phone] 06/23/2022, 2:46 PM

## 2022-06-23 NOTE — TOC CM/SW Note (Signed)
Transition of Care [facility]) - Inpatient Brief Assessment   Patient Details  Name: Timoteo Delorbe MRN: [id number] Date of Birth: [birth year]  Transition of Care [facility]) CM/SW Contact:    Mearl Latin, LCSW Phone Number: 06/23/2022, 4:57 PM   Clinical Narrative: Patient admitted from home with spouse and is active with [facility]. Patient was extubated today. No Transitions of Care needs have been identified at this time but please consult TOC if new needs arise.    Transition of Care Asessment: Insurance and Status: Insurance coverage has been reviewed Patient has primary care physician: Yes Home environment has been reviewed: From home Prior level of function:: Independent Prior/Current Home Services: No current home services Social Determinants of Health Reivew: SDOH reviewed no interventions necessary Readmission risk has been reviewed: Yes Transition of care needs: no transition of care needs at this time

## 2022-06-23 NOTE — Progress Notes (Signed)
NAME:  [name], MRN:  [id number], DOB:  January 04, 1956, LOS: 2 ADMISSION DATE:  06/21/2022, CONSULTATION DATE:  5/22 REFERRING MD:  Karene Fry, CHIEF COMPLAINT:  SOB   History of Present Illness:  67 year old man w/ hx of MG and recent admit for flare presenting to ER for worsening SOB.  Low NIF in ER with extreme tachypnea so intubated for airway protection.  No infectious prodrome, seems to have started just after discharged 2 days ago.  History per wife at bedside.  Neurology is following.  Pertinent  Medical History  MG DM2 Anxiety HTN Significant Hospital Events: Including procedures, antibiotic start and stop dates in addition to other pertinent events   5/22 admit 5/23 given Mg for replenishment, which exacerbated his MG symptoms. Extubation postponed. 5/24 pt extubated, ventilating and oxygenating well, EAS ordered for poor swallowing ability  Interim History / Subjective:  See above  Objective   Blood pressure (!) 88/68, pulse 84, temperature 97.7 F (36.5 C), temperature source Axillary, resp. rate 16, height 5\' 5"  (1.651 m), weight 52.7 kg, SpO2 99 %.    Vent Mode: PSV;CPAP FiO2 (%):  [40 %] 40 % Set Rate:  [16 bmp] 16 bmp Vt Set:  [490 mL] 490 mL PEEP:  [5 cmH20] 5 cmH20 Pressure Support:  [5 cmH20] 5 cmH20 Plateau Pressure:  [16 cmH20] 16 cmH20   Intake/Output Summary (Last 24 hours) at 06/23/2022 1150 Last data filed at 06/23/2022 0700 Gross per 24 hour  Intake 1490.46 ml  Output 1896 ml  Net -405.54 ml   Filed Weights   06/21/22 2000 06/22/22 0500 06/23/22 0709  Weight: 51.1 kg 52.5 kg 52.7 kg    Examination: General: ill-appearing male lying in bed in no acute distress HEENT: MM pink/moist, PEARL, mild right sided ptosis improved from yesterday Lungs: clear, no wheezing, passive on vent Cardiovascular: normal S1 S2, no mmrs rubs or gallops, appears well perfused Abdomen: soft, +BS Extremities: no edema Neuro: responds to comands and moves all  extremities equal and appropriately.  Resolved Hospital Problem list   N/A  Assessment & Plan:  Acute hypoxemic and hypercarbic respiratory failure due to recurrent MG crisis with unclear trigger. -continue to monitor airway patency, ventilation, oxygenation -continue tube feeds until swallow ability improves -resumption of home MG medications   DM2 without hyperglycemia -Sliding Scale insulin   Best Practice (right click and "Reselect all SmartList Selections" daily)   Diet/type: NPO DVT prophylaxis: prophylactic heparin  GI prophylaxis: PPI Lines: N/A Foley:  Yes, and it is still needed Code Status:  full code Last date of multidisciplinary goals of care discussion [wife updated at bedside]  Labs   CBC: Recent Labs  Lab 06/21/22 1605 06/21/22 1943 06/22/22 0426 06/22/22 0459  WBC 6.0  --   --  8.9  NEUTROABS 3.8  --   --   --   HGB 14.2 11.6* 11.2* 11.4*  HCT 40.4 34.0* 33.0* 31.7*  MCV 81.1  --   --  80.7  PLT 192  --   --  166    Basic Metabolic Panel: Recent Labs  Lab 06/17/22 0435 06/21/22 0926 06/21/22 1605 06/21/22 1943 06/22/22 0426 06/22/22 0459 06/22/22 1246 06/22/22 1732 06/23/22 0816  NA 128* 128* 129* 133* 134* 130*  --   --  133*  K 3.6 4.1 3.9 3.0* 3.0* 3.0*  --   --  3.5  CL 94* 94* 92*  --   --  100  --   --  99  CO2 27 29 25   --   --  20*  --   --  25  GLUCOSE 90 150* 126*  --   --  162*  --   --  148*  BUN 7* 9 9  --   --  9  --   --  9  CREATININE 0.89 0.96 1.00  --   --  0.88  --   --  0.97  CALCIUM 8.6* 9.7 9.7  --   --  8.1*  --   --  8.2*  MG  --   --   --   --   --  1.5* 2.5* 2.1 2.0  PHOS  --   --   --   --   --  2.5 2.1* 2.0* 3.1   GFR: Estimated Creatinine Clearance: 56.6 mL/min (by C-G formula based on SCr of 0.97 mg/dL). Recent Labs  Lab 06/21/22 1605 06/22/22 0459  WBC 6.0 8.9    Liver Function Tests: No results for input(s): "AST", "ALT", "ALKPHOS", "BILITOT", "PROT", "ALBUMIN" in the last 168 hours.  No  results for input(s): "LIPASE", "AMYLASE" in the last 168 hours. No results for input(s): "AMMONIA" in the last 168 hours.  ABG    Component Value Date/Time   PHART 7.406 06/22/2022 0426   PCO2ART 35.0 06/22/2022 0426   PO2ART 111 (H) 06/22/2022 0426   HCO3 22.0 06/22/2022 0426   TCO2 23 06/22/2022 0426   ACIDBASEDEF 2.0 06/22/2022 0426   O2SAT 98 06/22/2022 0426     Coagulation Profile: No results for input(s): "INR", "PROTIME" in the last 168 hours.  Cardiac Enzymes: No results for input(s): "CKTOTAL", "CKMB", "CKMBINDEX", "TROPONINI" in the last 168 hours.  HbA1C: Hemoglobin A1C  Date/Time Value Ref Range Status  02/03/2022 08:31 AM 5.9 (A) 4.0 - 5.6 % Final  05/02/2021 10:16 AM 5.9 (A) 4.0 - 5.6 % Final   Hgb A1c MFr Bld  Date/Time Value Ref Range Status  06/16/2022 07:22 AM 5.7 (H) 4.8 - 5.6 % Final    Comment:    (NOTE) Pre diabetes:          5.7%-6.4%  Diabetes:              >6.4%  Glycemic control for   <7.0% adults with diabetes   09/17/2017 09:47 PM 5.9 (H) 4.8 - 5.6 % Final    Comment:    (NOTE) Pre diabetes:          5.7%-6.4% Diabetes:              >6.4% Glycemic control for   <7.0% adults with diabetes     CBG: Recent Labs  Lab 06/22/22 1533 06/22/22 1954 06/22/22 2339 06/23/22 0347 06/23/22 0812  GLUCAP 133* 137* 144* 116* 136*    Review of Systems:   Intubated/sedated  Past Medical History:  He,  has a past medical history of Anxiety, Colon polyp, Depressive disorder, not elsewhere classified, External hemorrhoid, Insomnia, unspecified, Internal hemorrhoids, Myasthenia gravis without exacerbation (HCC), Other and unspecified hyperlipidemia, 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   Surgical History:   Past Surgical  History:  Procedure Laterality Date   THYMECTOMY       Social History:   reports that he has never smoked. He has never used smokeless tobacco. He  reports that he does not drink alcohol and does not use drugs.   Family History:  His family history is not on file.   Allergies Allergies  Allergen Reactions   Azithromycin Other (See Comments)    REACTION: exac of his mg   Beta Adrenergic Blockers Other (See Comments)    REACTION: exac of mg     Home Medications  Prior to Admission medications   Medication Sig Start Date End Date Taking? Authorizing Provider  ampicillin (PRINCIPEN) 500 MG capsule Take by mouth. 08/07/20   [provider]  calcium carbonate (OS-CAL) 600 MG TABS Take 600 mg by mouth 3 (three) times daily with meals.    [provider]  Cholecalciferol (VITAMIN D) 2000 UNITS tablet Take 2,000 Units by mouth daily.    [provider]  colesevelam (WELCHOL) 625 MG tablet Take 3 tablets (1,875 mg total) by mouth daily. 05/24/22   Shamleffer, Konrad Dolores, MD  desonide (DESONATE) 0.05 % gel Apply 1 application topically 2 (two) times daily as needed.     [provider]  entecavir (BARACLUDE) 1 MG tablet Take 1 tablet (1 mg total) by mouth daily. 07/13/11   Brooke Dare, MD  fluocinonide ointment (LIDEX) 0.05 % APPLY TO AFFECTED AREA(S) OF THE SKIN daily AS NEEDED. By dermatologist. 10/04/21   [place], [name], MD  FLUoxetine (PROZAC) 20 MG capsule TAKE ONE CAPSULE BY MOUTH DAILY 01/25/22   [place], [name], MD  glucose blood (ONETOUCH ULTRA) test strip USE TO TEST BLOOD SUGAR 3 TIMES DAILY 12/10/20   Romero Belling, MD  ibuprofen (ADVIL) 200 MG tablet Take 400 mg by mouth every 8 (eight) hours as needed for headache or mild pain.    [provider]  Lancets [facility] ULTRASOFT) lancets USE AS INSTRUCTED TO CHECK BLOOD SUGARS THREE TIMES A DAY 01/13/19   Romero Belling, MD  metFORMIN (GLUCOPHAGE) 500 MG tablet Take 2 tablets (1,000 mg  total) by mouth 2 (two) times daily. 02/03/22   Shamleffer, Konrad Dolores, MD  olmesartan (BENICAR) 20 MG tablet TAKE ONE TABLET BY MOUTH DAILY *DISCONTINUE DILITIAZEM* 05/28/22   Shamleffer, Konrad Dolores, MD  omeprazole (PRILOSEC) 20 MG capsule TAKE 1 CAPSULE BY MOUTH DAILY 05/29/22   [place], [name], MD  pimecrolimus (ELIDEL) 1 % cream Apply 1 application topically 2 (two) times daily as needed.  01/01/13   [provider]  predniSONE (DELTASONE) 5 MG tablet Take 10 mg by mouth as directed.    [provider]  pyridostigmine (MESTINON) 60 MG tablet Take 60 mg by mouth 3 (three) times daily.    [provider]  repaglinide (PRANDIN) 0.5 MG tablet Take 1 tablet (0.5 mg total) by mouth daily before supper. Patient taking differently: Take 0.5 mg by mouth daily as needed (when BG > 200). 02/03/22   Shamleffer, Konrad Dolores, MD  sitaGLIPtin (JANUVIA) 100 MG tablet Take 1 tablet (100 mg total) by mouth daily. 02/03/22   Shamleffer, Konrad Dolores, MD  zolpidem (AMBIEN) 10 MG tablet TAKE 1/2 TO 1 TABLET (0.5-1) BY MOUTH EVERY NIGHT AT BEDTIME AS NEEDED 05/29/22   [place], [name], MD     Critical care time: 45 minutes    Windy Carina, MS4

## 2022-06-24 ENCOUNTER — Inpatient Hospital Stay (HOSPITAL_COMMUNITY): Payer: Medicare Other

## 2022-06-24 DIAGNOSIS — G7001 Myasthenia gravis with (acute) exacerbation: Secondary | ICD-10-CM | POA: Diagnosis not present

## 2022-06-24 LAB — PHOSPHORUS
Phosphorus: 2 mg/dL — ABNORMAL LOW (ref 2.5–4.6)
Phosphorus: 2.2 mg/dL — ABNORMAL LOW (ref 2.5–4.6)

## 2022-06-24 LAB — BASIC METABOLIC PANEL
Anion gap: 9 (ref 5–15)
BUN: 15 mg/dL (ref 8–23)
CO2: 19 mmol/L — ABNORMAL LOW (ref 22–32)
Calcium: 7.9 mg/dL — ABNORMAL LOW (ref 8.9–10.3)
Chloride: 99 mmol/L (ref 98–111)
Creatinine, Ser: 0.9 mg/dL (ref 0.61–1.24)
GFR, Estimated: >60 mL/min (ref >60)
Glucose, Bld: 204 mg/dL — ABNORMAL HIGH (ref 70–99)
Potassium: 2.8 mmol/L — ABNORMAL LOW (ref 3.5–5.1)
Sodium: 127 mmol/L — ABNORMAL LOW (ref 135–145)

## 2022-06-24 LAB — URINALYSIS, COMPLETE (UACMP) WITH MICROSCOPIC
Bilirubin Urine: NEGATIVE
Glucose, UA: >=500 mg/dL — AB
Hgb urine dipstick: NEGATIVE
Ketones, ur: 20 mg/dL — AB
Leukocytes,Ua: NEGATIVE
Nitrite: NEGATIVE
Protein, ur: 30 mg/dL — AB
Specific Gravity, Urine: 1.014 (ref 1.005–1.030)
pH: 6 (ref 5.0–8.0)

## 2022-06-24 LAB — RESPIRATORY PANEL BY PCR

## 2022-06-24 LAB — OSMOLALITY: Osmolality: 284 mOsm/kg (ref 275–295)

## 2022-06-24 LAB — GLUCOSE, CAPILLARY
Glucose-Capillary: 207 mg/dL — ABNORMAL HIGH (ref 70–99)
Glucose-Capillary: 172 mg/dL — ABNORMAL HIGH (ref 70–99)
Glucose-Capillary: 221 mg/dL — ABNORMAL HIGH (ref 70–99)
Glucose-Capillary: 239 mg/dL — ABNORMAL HIGH (ref 70–99)
Glucose-Capillary: 152 mg/dL — ABNORMAL HIGH (ref 70–99)
Glucose-Capillary: 128 mg/dL — ABNORMAL HIGH (ref 70–99)

## 2022-06-24 LAB — MAGNESIUM
Magnesium: 2.1 mg/dL (ref 1.7–2.4)
Magnesium: 2.1 mg/dL (ref 1.7–2.4)

## 2022-06-24 LAB — CULTURE, RESPIRATORY W GRAM STAIN

## 2022-06-24 MED ORDER — POTASSIUM CHLORIDE 10 MEQ/100ML IV SOLN
10.0000 meq | INTRAVENOUS | Status: AC
  Administered 2022-06-24 (×4): 10 meq via INTRAVENOUS
  Filled 2022-06-24 (×4): qty 100

## 2022-06-24 MED ORDER — POTASSIUM CHLORIDE 20 MEQ PO PACK
40.0000 meq | PACK | Freq: Once | ORAL | Status: AC
  Administered 2022-06-24: 40 meq
  Filled 2022-06-24: qty 2

## 2022-06-24 MED ORDER — ACETAMINOPHEN 160 MG/5ML PO SOLN
650.0000 mg | Freq: Four times a day (QID) | ORAL | Status: DC | PRN
  Administered 2022-06-24: 650 mg
  Filled 2022-06-24: qty 20.3

## 2022-06-24 MED ORDER — PREDNISONE 5 MG PO TABS
10.0000 mg | ORAL_TABLET | Freq: Every day | ORAL | Status: DC
  Administered 2022-06-25 – 2022-07-07 (×13): 10 mg
  Filled 2022-06-24 (×7): qty 1
  Filled 2022-06-24: qty 2
  Filled 2022-06-24: qty 1
  Filled 2022-06-24: qty 2
  Filled 2022-06-24: qty 1
  Filled 2022-06-24: qty 2
  Filled 2022-06-24: qty 1
  Filled 2022-06-24: qty 2

## 2022-06-24 MED ORDER — INSULIN ASPART 100 UNIT/ML IJ SOLN
0.0000 [IU] | INTRAMUSCULAR | Status: DC
  Administered 2022-06-24: 4 [IU] via SUBCUTANEOUS
  Administered 2022-06-24: 7 [IU] via SUBCUTANEOUS
  Administered 2022-06-24: 3 [IU] via SUBCUTANEOUS
  Administered 2022-06-24: 7 [IU] via SUBCUTANEOUS
  Administered 2022-06-25 (×3): 4 [IU] via SUBCUTANEOUS
  Administered 2022-06-25 (×2): 7 [IU] via SUBCUTANEOUS
  Administered 2022-06-26: 4 [IU] via SUBCUTANEOUS
  Administered 2022-06-26: 3 [IU] via SUBCUTANEOUS
  Administered 2022-06-26: 7 [IU] via SUBCUTANEOUS
  Administered 2022-06-26 – 2022-06-27 (×2): 4 [IU] via SUBCUTANEOUS
  Administered 2022-06-27: 7 [IU] via SUBCUTANEOUS
  Administered 2022-06-27: 4 [IU] via SUBCUTANEOUS
  Administered 2022-06-27: 7 [IU] via SUBCUTANEOUS
  Administered 2022-06-27: 4 [IU] via SUBCUTANEOUS
  Administered 2022-06-27: 7 [IU] via SUBCUTANEOUS
  Administered 2022-06-28 (×2): 4 [IU] via SUBCUTANEOUS
  Administered 2022-06-28: 3 [IU] via SUBCUTANEOUS
  Administered 2022-06-28: 4 [IU] via SUBCUTANEOUS
  Administered 2022-06-28: 7 [IU] via SUBCUTANEOUS
  Administered 2022-06-29: 4 [IU] via SUBCUTANEOUS
  Administered 2022-06-29: 7 [IU] via SUBCUTANEOUS
  Administered 2022-06-29 (×3): 4 [IU] via SUBCUTANEOUS
  Administered 2022-06-29: 3 [IU] via SUBCUTANEOUS
  Administered 2022-06-30 (×2): 7 [IU] via SUBCUTANEOUS
  Administered 2022-06-30 (×3): 4 [IU] via SUBCUTANEOUS
  Administered 2022-07-01: 11 [IU] via SUBCUTANEOUS
  Administered 2022-07-01 (×3): 7 [IU] via SUBCUTANEOUS
  Administered 2022-07-01: 11 [IU] via SUBCUTANEOUS
  Administered 2022-07-01: 7 [IU] via SUBCUTANEOUS
  Administered 2022-07-02: 4 [IU] via SUBCUTANEOUS
  Administered 2022-07-02: 3 [IU] via SUBCUTANEOUS
  Administered 2022-07-02: 7 [IU] via SUBCUTANEOUS
  Administered 2022-07-02: 4 [IU] via SUBCUTANEOUS
  Administered 2022-07-02: 5 [IU] via SUBCUTANEOUS
  Administered 2022-07-02: 7 [IU] via SUBCUTANEOUS
  Administered 2022-07-02 – 2022-07-03 (×2): 4 [IU] via SUBCUTANEOUS
  Administered 2022-07-03: 3 [IU] via SUBCUTANEOUS
  Administered 2022-07-03: 7 [IU] via SUBCUTANEOUS
  Administered 2022-07-03: 3 [IU] via SUBCUTANEOUS
  Administered 2022-07-03: 11 [IU] via SUBCUTANEOUS
  Administered 2022-07-03 – 2022-07-04 (×2): 7 [IU] via SUBCUTANEOUS
  Administered 2022-07-04: 4 [IU] via SUBCUTANEOUS
  Administered 2022-07-04: 7 [IU] via SUBCUTANEOUS
  Administered 2022-07-04: 3 [IU] via SUBCUTANEOUS
  Administered 2022-07-04 – 2022-07-05 (×4): 7 [IU] via SUBCUTANEOUS
  Administered 2022-07-05: 4 [IU] via SUBCUTANEOUS
  Administered 2022-07-05: 7 [IU] via SUBCUTANEOUS
  Administered 2022-07-05 – 2022-07-06 (×2): 4 [IU] via SUBCUTANEOUS
  Administered 2022-07-06: 7 [IU] via SUBCUTANEOUS
  Administered 2022-07-06 (×2): 4 [IU] via SUBCUTANEOUS
  Administered 2022-07-06: 7 [IU] via SUBCUTANEOUS
  Administered 2022-07-07: 3 [IU] via SUBCUTANEOUS
  Administered 2022-07-07: 15 [IU] via SUBCUTANEOUS
  Administered 2022-07-07: 7 [IU] via SUBCUTANEOUS
  Administered 2022-07-07: 11 [IU] via SUBCUTANEOUS

## 2022-06-24 MED ORDER — POTASSIUM PHOSPHATES 15 MMOLE/5ML IV SOLN
30.0000 mmol | Freq: Once | INTRAVENOUS | Status: AC
  Administered 2022-06-24: 30 mmol via INTRAVENOUS
  Filled 2022-06-24: qty 10

## 2022-06-24 MED ORDER — ACETAMINOPHEN 160 MG/5ML PO SOLN
650.0000 mg | Freq: Four times a day (QID) | ORAL | Status: DC | PRN
  Administered 2022-06-24: 650 mg
  Filled 2022-06-24 (×2): qty 20.3

## 2022-06-24 NOTE — Progress Notes (Signed)
NAME:  [name], MRN:  [id number], DOB:  September 21, 1955, LOS: 3 ADMISSION DATE:  06/21/2022, CONSULTATION DATE:  5/22 REFERRING MD:  Karene Fry, CHIEF COMPLAINT:  SOB   History of Present Illness:  67 year old man w/ hx of MG and recent admit for flare presenting to ER for worsening SOB.  Low NIF in ER with extreme tachypnea so intubated for airway protection.  No infectious prodrome, seems to have started just after discharged 2 days ago.  History per wife at bedside.  Neurology is following.  Pertinent  Medical History  MG DM2 Anxiety HTN Significant Hospital Events: Including procedures, antibiotic start and stop dates in addition to other pertinent events   5/22 admit 5/23 given Mg for replenishment, which exacerbated his MG symptoms. Extubation postponed. 5/24 pt extubated, ventilating and oxygenating well, EAS ordered for poor swallowing ability  Interim History / Subjective:  Extubated and reintubated yesterday. Very low NIFs around time of reintubation  Objective   Blood pressure 123/84, pulse 76, temperature (!) 97.4 F (36.3 C), temperature source Axillary, resp. rate (!) 22, height 5\' 5"  (1.651 m), weight 52.7 kg, SpO2 99 %.    Vent Mode: PRVC FiO2 (%):  [40 %-100 %] 40 % Set Rate:  [22 bmp] 22 bmp Vt Set:  [490 mL] 490 mL PEEP:  [8 cmH20] 8 cmH20 Plateau Pressure:  [20 cmH20-22 cmH20] 20 cmH20   Intake/Output Summary (Last 24 hours) at 06/24/2022 1347 Last data filed at 06/24/2022 1200 Gross per 24 hour  Intake 3435.47 ml  Output 2575 ml  Net 860.47 ml   Filed Weights   06/21/22 2000 06/22/22 0500 06/23/22 0709  Weight: 51.1 kg 52.5 kg 52.7 kg    Examination: Follows commands, can weakly lift head off bed, raise BUE, BLE CTAB, equal chest rise, +secretions in ballard but not requiring frequent suctioning  Resolved Hospital Problem list   N/A  Assessment & Plan:  # Acute hypoxemic and hypercarbic respiratory failure due to recurrent MG crisis with unclear  trigger. While he looks ok on minimal settings now and seems to have decent motor exam his NIFs periintubation yesterday give me pause wrt extubation today.  -full vent support -daily SAT/SBT -continue tube feeds until swallow ability improves -home MG medications  -continue second round IVIG per neuro  # Hyponatremia - f/u s osm, may be pseudohypona given all his IVIG  # DM2 without hyperglycemia -Sliding Scale insulin   Best Practice (right click and "Reselect all SmartList Selections" daily)   Diet/type: NPO DVT prophylaxis: prophylactic heparin  GI prophylaxis: PPI Lines: N/A Foley:  Yes, and it is still needed Code Status:  full code Last date of multidisciplinary goals of care discussion [wife updated at bedside]  Labs   CBC: Recent Labs  Lab 06/21/22 1605 06/21/22 1943 06/22/22 0426 06/22/22 0459 06/23/22 1354 06/23/22 1509  WBC 6.0  --   --  8.9  --   --   NEUTROABS 3.8  --   --   --   --   --   HGB 14.2 11.6* 11.2* 11.4* 13.6 12.6*  HCT 40.4 34.0* 33.0* 31.7* 40.0 37.0*  MCV 81.1  --   --  80.7  --   --   PLT 192  --   --  166  --   --     Basic Metabolic Panel: Recent Labs  Lab 06/21/22 0926 06/21/22 1605 06/21/22 1943 06/22/22 0459 06/22/22 1246 06/22/22 1732 06/23/22 0816 06/23/22 1354 06/23/22 1509 06/23/22  1808 06/24/22 0406  NA 128* 129*   < > 130*  --   --  133* 132* 132*  --  127*  K 4.1 3.9   < > 3.0*  --   --  3.5 3.5 3.8  --  2.8*  CL 94* 92*  --  100  --   --  99  --   --   --  99  CO2 29 25  --  20*  --   --  25  --   --   --  19*  GLUCOSE 150* 126*  --  162*  --   --  148*  --   --   --  204*  BUN 9 9  --  9  --   --  9  --   --   --  15  CREATININE 0.96 1.00  --  0.88  --   --  0.97  --   --   --  0.90  CALCIUM 9.7 9.7  --  8.1*  --   --  8.2*  --   --   --  7.9*  MG  --   --    < > 1.5* 2.5* 2.1 2.0  --   --  2.0 2.1  PHOS  --   --    < > 2.5 2.1* 2.0* 3.1  --   --  2.8 2.0*   < > = values in this interval not displayed.    GFR: Estimated Creatinine Clearance: 61 mL/min (by C-G formula based on SCr of 0.9 mg/dL). Recent Labs  Lab 06/21/22 1605 06/22/22 0459  WBC 6.0 8.9    Liver Function Tests: No results for input(s): "AST", "ALT", "ALKPHOS", "BILITOT", "PROT", "ALBUMIN" in the last 168 hours.  No results for input(s): "LIPASE", "AMYLASE" in the last 168 hours. No results for input(s): "AMMONIA" in the last 168 hours.  ABG    Component Value Date/Time   PHART 7.336 (L) 06/23/2022 1509   PCO2ART 38.3 06/23/2022 1509   PO2ART 486 (H) 06/23/2022 1509   HCO3 20.6 06/23/2022 1509   TCO2 22 06/23/2022 1509   ACIDBASEDEF 5.0 (H) 06/23/2022 1509   O2SAT 100 06/23/2022 1509     Coagulation Profile: No results for input(s): "INR", "PROTIME" in the last 168 hours.  Cardiac Enzymes: No results for input(s): "CKTOTAL", "CKMB", "CKMBINDEX", "TROPONINI" in the last 168 hours.  HbA1C: Hemoglobin A1C  Date/Time Value Ref Range Status  02/03/2022 08:31 AM 5.9 (A) 4.0 - 5.6 % Final  05/02/2021 10:16 AM 5.9 (A) 4.0 - 5.6 % Final   Hgb A1c MFr Bld  Date/Time Value Ref Range Status  06/16/2022 07:22 AM 5.7 (H) 4.8 - 5.6 % Final    Comment:    (NOTE) Pre diabetes:          5.7%-6.4%  Diabetes:              >6.4%  Glycemic control for   <7.0% adults with diabetes   09/17/2017 09:47 PM 5.9 (H) 4.8 - 5.6 % Final    Comment:    (NOTE) Pre diabetes:          5.7%-6.4% Diabetes:              >6.4% Glycemic control for   <7.0% adults with diabetes     CBG: Recent Labs  Lab 06/23/22 1936 06/23/22 2329 06/24/22 0352 06/24/22 0821 06/24/22 1222  GLUCAP 242* 196* 207* 172* 221*  Review of Systems:   Intubated/sedated  Past Medical History:  He,  has a past medical history of Anxiety, Colon polyp, Depressive disorder, not elsewhere classified, External hemorrhoid, Insomnia, unspecified, Internal hemorrhoids, Myasthenia gravis without exacerbation (HCC), Other and unspecified  hyperlipidemia, 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   Surgical History:   Past Surgical History:  Procedure Laterality Date   THYMECTOMY       Social History:   reports that he has never smoked. He has never used smokeless tobacco. He reports that he does not drink alcohol and does not use drugs.   Family History:  His family history is not on file.   Allergies Allergies  Allergen Reactions   Azithromycin Other (See Comments)    REACTION: exac of his mg   Beta Adrenergic Blockers Other (See Comments)    REACTION: exac of mg     Home Medications  Prior to Admission medications   Medication Sig Start Date End Date Taking? Authorizing Provider  ampicillin (PRINCIPEN) 500 MG capsule Take by mouth. 08/07/20   [provider]  calcium carbonate (OS-CAL) 600 MG TABS Take 600 mg by mouth 3 (three) times daily with meals.    [provider]  Cholecalciferol (VITAMIN D) 2000 UNITS tablet Take 2,000 Units by mouth daily.    [provider]  colesevelam (WELCHOL) 625 MG tablet Take 3 tablets (1,875 mg total) by mouth daily. 05/24/22   Shamleffer, Konrad Dolores, MD  desonide (DESONATE) 0.05 % gel Apply 1 application topically 2 (two) times daily as needed.     [provider]  entecavir (BARACLUDE) 1 MG tablet Take 1 tablet (1 mg total) by mouth daily. 07/13/11   Brooke Dare, MD  fluocinonide ointment (LIDEX) 0.05 % APPLY TO AFFECTED AREA(S) OF THE SKIN daily AS NEEDED. By dermatologist. 10/04/21   [place], [name], MD  FLUoxetine (PROZAC) 20 MG capsule TAKE ONE CAPSULE BY MOUTH DAILY 01/25/22   [place], [name], MD  glucose blood (ONETOUCH ULTRA) test strip USE TO TEST BLOOD SUGAR 3 TIMES DAILY 12/10/20   Romero Belling, MD  ibuprofen (ADVIL)  200 MG tablet Take 400 mg by mouth every 8 (eight) hours as needed for headache or mild pain.    [provider]  Lancets [facility] ULTRASOFT) lancets USE AS INSTRUCTED TO CHECK BLOOD SUGARS THREE TIMES A DAY 01/13/19   Romero Belling, MD  metFORMIN (GLUCOPHAGE) 500 MG tablet Take 2 tablets (1,000 mg total) by mouth 2 (two) times daily. 02/03/22   Shamleffer, Konrad Dolores, MD  olmesartan (BENICAR) 20 MG tablet TAKE ONE TABLET BY MOUTH DAILY *DISCONTINUE DILITIAZEM* 05/28/22   Shamleffer, Konrad Dolores, MD  omeprazole (PRILOSEC) 20 MG capsule TAKE 1 CAPSULE BY MOUTH DAILY 05/29/22   [place], [name], MD  pimecrolimus (ELIDEL) 1 % cream Apply 1 application topically 2 (two) times daily as needed.  01/01/13   [provider]  predniSONE (DELTASONE) 5 MG tablet Take 10 mg by mouth as directed.    [provider]  pyridostigmine (MESTINON) 60 MG tablet Take 60 mg by mouth 3 (three) times daily.    [provider]  repaglinide (PRANDIN) 0.5 MG tablet Take 1 tablet (0.5 mg total) by mouth daily before supper. Patient taking differently: Take 0.5 mg by mouth daily as needed (when BG >  200). 02/03/22   Shamleffer, Konrad Dolores, MD  sitaGLIPtin (JANUVIA) 100 MG tablet Take 1 tablet (100 mg total) by mouth daily. 02/03/22   Shamleffer, Konrad Dolores, MD  zolpidem (AMBIEN) 10 MG tablet TAKE 1/2 TO 1 TABLET (0.5-1) BY MOUTH EVERY NIGHT AT BEDTIME AS NEEDED 05/29/22   [place], [name], MD     Critical care time: 35 minutes    Laroy Apple Pulmonary/Critical Care

## 2022-06-24 NOTE — Progress Notes (Signed)
[facility] ADULT ICU REPLACEMENT PROTOCOL   The patient does apply for the [facility] Adult ICU Electrolyte Replacment Protocol based on the criteria listed below:   1.Exclusion criteria: TCTS, ECMO, Dialysis, and Myasthenia Gravis patients 2. Is GFR >/= 30 ml/min? Yes.    Patient's GFR today is > 60 3. Is SCr </= 2? Yes.   Patient's SCr is 0.90 mg/dL 4. Did SCr increase >/= 0.5 in 24 hours? No. 5.Pt's weight >40kg  Yes.   6. Abnormal electrolyte(s): K+ 2.8  7. Electrolytes replaced per protocol 8.  Call MD STAT for K+ </= 2.5, Phos </= 1, or Mag </= 1 Physician:  Dr Rhodia Albright, Jettie Booze 06/24/2022 6:07 AM

## 2022-06-24 NOTE — Progress Notes (Signed)
eLink Physician-Brief Progress Note Patient Name: [name] DOB: 06-17-1955 MRN: [id number]   Date of Service  06/24/2022  HPI/Events of Note  Asking to add tylenol PR modification for pain also.   eICU Interventions  Modified.      Intervention Category Intermediate Interventions: Pain - evaluation and management  Ranee Gosselin 06/24/2022, 8:50 PM  01:36 Tylenol 500 mg q4 hr prn for pain, headache, from q6 hrly prn, if not better to call back. MAP is ok.

## 2022-06-24 NOTE — Progress Notes (Signed)
eLink Physician-Brief Progress Note Patient Name: [name] DOB: 1955/10/06 MRN: [id number]   Date of Service  06/24/2022  HPI/Events of Note  Acute respiratory failure with hypoxemia and hypercapnia secondary to myasthenia gravis, intubated and extubated but eventually reintubated after failing BiPAP.  After second dose of IVIG.  Temperature of 101.1 tonight.  No leukocytosis.  eICU Interventions  Obtain blood and respiratory cultures  As needed Tylenol     Intervention Category Minor Interventions: Routine modifications to care plan (e.g. PRN medications for pain, fever)  Gurneet Matarese 06/24/2022, 12:22 AM

## 2022-06-24 NOTE — Progress Notes (Signed)
Neurology Progress Note  Brief HPI: [name] is a 67 y.o. male with a past medical history of eropositive myasthenia gravis, malignant thymoma s/p radiation and resection in 2003,  PTSD, DM2, hepatitis B, anxiety, depression, and insomnia. He follows with Dr. Jeremy Johann at [facility] and saw her last on 06/08/2022. He has received IVIG and PLEX in the past for exacerbations, both with therapeutic effect. At his visit on 5/9 he stated that he felt he is weak all over with head drop, right arm weakness, SOB, chewing difficulty. She increased his prednisone to 10mg  daily and he was admitted to [facility] from 5/17- 5/20 after receiving IVIG 400mg /kg given on 5/17 and then 800mg /kg on 5/18 and 5/19. He felt that he initially improved and then started having trouble again on Tuesday. His wife also states that he started having shortness of breath on Tuesday and then was seen by his PCP this morning and recommended to go to the ED due to worsening SOB and difficulty swallowing. He was intubated in the ED  Subjective: Patient was extubated yesterday after which he became increasingly agitated, tachycardic, tachypneic, and hypertensive. Subjectively dyspneic. No improvement with bipap. ABG with mild hypercarbia. He was reintubated. Remains intubated. Awake alert in no distress  Vitals:   06/24/22 0800 06/24/22 0827  BP:    Pulse:  79  Resp:  (!) 22  Temp: (!) 97.4 F (36.3 C)   SpO2:  99%   General: Intubated, no sedation HEENT: Normocephalic atraumatic CV: Regular rhythm Abdomen nondistended nontender Neurological exam He is awake alert intubated on no sedation He is able to nod yes and no appropriately Appears to participate in the conversation without a problem Cranial nerves: Pupils are equal round react light, right eye ptosis more prominent.  Mild ptosis on the left.  Face symmetric.  Tongue and palate midline. Motor examination with antigravity strength in all fours with mild fatigable  weakness in the upper extremities.  Lower extremity has much better resistance to force being applied in opposing directions to him.  Less fatigability in the lower extremity. Sensation intact Cerebellar: No gross dysmetria  Labs    Latest Ref Rng & Units 06/23/2022    3:09 PM 06/23/2022    1:54 PM 06/22/2022    4:59 AM  CBC  WBC 4.0 - 10.5 K/uL   8.9   Hemoglobin 13.0 - 17.0 g/dL 09.8  11.9  14.7   Hematocrit 39.0 - 52.0 % 37.0  40.0  31.7   Platelets 150 - 400 K/uL   166       Latest Ref Rng & Units 06/24/2022    4:06 AM 06/23/2022    3:09 PM 06/23/2022    1:54 PM  BMP  Glucose 70 - 99 mg/dL 829     BUN 8 - 23 mg/dL 15     Creatinine 5.62 - 1.24 mg/dL 1.30     Sodium 865 - 784 mmol/L 127  132  132   Potassium 3.5 - 5.1 mmol/L 2.8  3.8  3.5   Chloride 98 - 111 mmol/L 99     CO2 22 - 32 mmol/L 19     Calcium 8.9 - 10.3 mg/dL 7.9       Assessment:  66/M, hx seropositive myasthenia gravis recently admitted for exacerbation. He improved on IVIG and was discharged to home 3 days ago, at which point his respiratory function was vastly improved although he did have some residual ptosis. He now presents 3 days after  discharge with respiratory distress and NIF -19. Both his inpatient and outpatient neurologists have raised the concerns that some of his symptoms and exacerbations may be more somatoform in etiology 2/2 his severe anxiety 2/2 PTSD may be playing a role.  He was reintubated due to concerns for increasing hypercarbia on his blood gases. He was started on repeat course of IVIG after weighing IVIG versus Plex and deciding not to do plasma exchange. Today is going to be his second dose of IVIG second round.  I am not sure if he would need a full 5 doses but see how he does tomorrow. In the meanwhile we will try to attempt extubation  Impression:  MG Exacerbation  Recommendations: - Vent management per PCCM-consider extubation -Continue IVIG 400 mg/kg/day.  Initial plan was to  do a repeat course of full 5 days but may have to do a shorter course if he clinically shows improvement. - Continue home prednisone - Continue home mestinon 60mg  tid  Will follow again tomorrow and make changes recommendations based on clinical course  Plan was discussed with Dr.Meier, critical care attending Plan was also discussed with the patient's daughter and wife at bedside.  - Medications that may worsen or trigger MG exacerbation: Class IA antiarrhythmics, magnesium, flouroquinolones, macrolides, aminoglycosides, penicillamine, curare, interferon alpha, botox, quinine. Use with caution: calcium channel blocker, beta blockers and statins.   -- Milon Dikes, MD Neurologist [facility] Pager: [phone]   CRITICAL CARE ATTESTATION Performed by: Milon Dikes, MD Total critical care time: 35 minutes Critical care time was exclusive of separately billable procedures and treating other patients and/or supervising APPs/Residents/Students Critical care was necessary to treat or prevent imminent or life-threatening deterioration. This patient is critically ill and at significant risk for neurological worsening and/or death and care requires constant monitoring. Critical care was time spent personally by me on the following activities: development of treatment plan with patient and/or surrogate as well as nursing, discussions with consultants, evaluation of patient's response to treatment, examination of patient, obtaining history from patient or surrogate, ordering and performing treatments and interventions, ordering and review of laboratory studies, ordering and review of radiographic studies, pulse oximetry, re-evaluation of patient's condition, participation in multidisciplinary rounds and medical decision making of high complexity in the care of this patient.

## 2022-06-24 NOTE — Progress Notes (Signed)
PT Cancellation Note  Patient Details Name: [name] MRN: [id number] DOB: 14-Jan-1956   Cancelled Treatment:    Reason Eval/Treat Not Completed: Medical issues which prohibited therapy  Noted pt reintubated since PT order placed. On sedation. RN agreed best for PT to check back on Sunday.    Jerolyn Center, PT Acute Rehabilitation Services  Office [phone]   Zena Amos 06/24/2022, 9:05 AM

## 2022-06-25 ENCOUNTER — Other Ambulatory Visit: Payer: Self-pay

## 2022-06-25 DIAGNOSIS — G7001 Myasthenia gravis with (acute) exacerbation: Secondary | ICD-10-CM | POA: Diagnosis not present

## 2022-06-25 LAB — GLUCOSE, CAPILLARY
Glucose-Capillary: 101 mg/dL — ABNORMAL HIGH (ref 70–99)
Glucose-Capillary: 164 mg/dL — ABNORMAL HIGH (ref 70–99)
Glucose-Capillary: 177 mg/dL — ABNORMAL HIGH (ref 70–99)
Glucose-Capillary: 182 mg/dL — ABNORMAL HIGH (ref 70–99)
Glucose-Capillary: 210 mg/dL — ABNORMAL HIGH (ref 70–99)
Glucose-Capillary: 226 mg/dL — ABNORMAL HIGH (ref 70–99)

## 2022-06-25 LAB — BASIC METABOLIC PANEL
Anion gap: 8 (ref 5–15)
BUN: 13 mg/dL (ref 8–23)
CO2: 18 mmol/L — ABNORMAL LOW (ref 22–32)
Calcium: 7.5 mg/dL — ABNORMAL LOW (ref 8.9–10.3)
Chloride: 103 mmol/L (ref 98–111)
Creatinine, Ser: 0.8 mg/dL (ref 0.61–1.24)
GFR, Estimated: >60 mL/min (ref >60)
Glucose, Bld: 171 mg/dL — ABNORMAL HIGH (ref 70–99)
Potassium: 3.4 mmol/L — ABNORMAL LOW (ref 3.5–5.1)
Sodium: 129 mmol/L — ABNORMAL LOW (ref 135–145)

## 2022-06-25 LAB — PHOSPHORUS: Phosphorus: 2.2 mg/dL — ABNORMAL LOW (ref 2.5–4.6)

## 2022-06-25 LAB — T4, FREE: Free T4: 1.02 ng/dL (ref 0.61–1.12)

## 2022-06-25 LAB — CULTURE, RESPIRATORY W GRAM STAIN

## 2022-06-25 LAB — MAGNESIUM: Magnesium: 2 mg/dL (ref 1.7–2.4)

## 2022-06-25 MED ORDER — PHENTOLAMINE MESYLATE 5 MG IJ SOLR
5.0000 mg | Freq: Once | INTRAMUSCULAR | Status: AC
  Administered 2022-06-25: 5 mg via SUBCUTANEOUS
  Filled 2022-06-25: qty 5

## 2022-06-25 MED ORDER — FUROSEMIDE 10 MG/ML IJ SOLN
40.0000 mg | Freq: Once | INTRAMUSCULAR | Status: AC
  Administered 2022-06-25: 40 mg via INTRAVENOUS
  Filled 2022-06-25: qty 4

## 2022-06-25 MED ORDER — POTASSIUM CHLORIDE 20 MEQ PO PACK
40.0000 meq | PACK | ORAL | Status: AC
  Administered 2022-06-25 (×2): 40 meq
  Filled 2022-06-25 (×2): qty 2

## 2022-06-25 MED ORDER — POTASSIUM PHOSPHATES 15 MMOLE/5ML IV SOLN
45.0000 mmol | Freq: Once | INTRAVENOUS | Status: AC
  Administered 2022-06-25: 45 mmol via INTRAVENOUS
  Filled 2022-06-25: qty 15

## 2022-06-25 MED ORDER — ACETAMINOPHEN 160 MG/5ML PO SOLN
500.0000 mg | ORAL | Status: AC | PRN
  Administered 2022-06-25 (×4): 500 mg
  Filled 2022-06-25 (×3): qty 20.3

## 2022-06-25 MED ORDER — HYALURONIDASE HUMAN 150 UNIT/ML IJ SOLN: 150.0000 [IU] | Freq: Once | INTRAMUSCULAR | Status: DC

## 2022-06-25 MED ORDER — CLONAZEPAM 0.25 MG PO TBDP
0.2500 mg | ORAL_TABLET | Freq: Two times a day (BID) | ORAL | Status: DC
  Administered 2022-06-26 – 2022-06-30 (×9): 0.25 mg
  Filled 2022-06-25 (×9): qty 1

## 2022-06-25 MED ORDER — CLONAZEPAM 0.25 MG PO TBDP
0.2500 mg | ORAL_TABLET | Freq: Two times a day (BID) | ORAL | Status: DC
  Administered 2022-06-25 (×2): 0.25 mg via ORAL
  Filled 2022-06-25 (×2): qty 1

## 2022-06-25 NOTE — Progress Notes (Signed)
Spoke with pt and wife at length re PICC line placement.  Both adamantly refused an interpreter service.  Explained risks and benefits.  Pt wrote on pad of paper, 'Not at this time but I will consider it later on Thank you'. Wife clearly stated, If he says no, then I have to say no. Wife continued to ask questions, all answered and wife verbalized understanding by teach back method and written down in her notes. Cara RN aware of conversation and pt refusal at this time. Returned to room approximately 30 minutes later, reassessed PIV's x 2, and confirmed again with the pt and wife.  Pt continues to shake head no to agreeing to procedure.  Aware that another PIV may be needed soon, pt shook head yes.  Charlynne Pander RN again aware of second conversation with pt and wife.

## 2022-06-25 NOTE — Progress Notes (Signed)
Patient and wife declined use of interpreter to explain placement of picc line. Patient wife stated that she was uncomfortable with interpreter and would rather have it explained to her in [place]. IV team nurse made aware.

## 2022-06-25 NOTE — Progress Notes (Signed)
PT Cancellation Note  Patient Details Name: [name] MRN: [id number] DOB: 07-Feb-1955   Cancelled Treatment:    Reason Eval/Treat Not Completed: Medical issues which prohibited therapy  Noted pt remains intubated and with difficulty weaning. Will check back 5/27.   Jerolyn Center, PT Acute Rehabilitation Services  Office [phone]   Zena Amos 06/25/2022, 1:42 PM

## 2022-06-25 NOTE — Progress Notes (Signed)
NAME:  [name], MRN:  [id number], DOB:  02-Feb-1955, LOS: 4 ADMISSION DATE:  06/21/2022, CONSULTATION DATE:  5/22 REFERRING MD:  Karene Fry, CHIEF COMPLAINT:  SOB   History of Present Illness:  67 year old man w/ hx of MG and recent admit for flare presenting to ER for worsening SOB.  Low NIF in ER with extreme tachypnea so intubated for airway protection.  No infectious prodrome, seems to have started just after discharged 2 days ago.  History per wife at bedside.  Neurology is following.  Pertinent  Medical History  MG DM2 Anxiety HTN Significant Hospital Events: Including procedures, antibiotic start and stop dates in addition to other pertinent events   5/22 admit 5/23 given Mg for replenishment, which exacerbated his MG symptoms. Extubation postponed. 5/24 pt extubate, reintubated. NIFs were -10 to -15 before reintubation.  Interim History / Subjective:  No acute issues   While he is eager to be extubated he says he's having a hard time breathing now on PS 10/5. RR ~30 and tidal volumes 400s.   Objective   Blood pressure 131/68, pulse 83, temperature 97.7 F (36.5 C), temperature source Axillary, resp. rate (!) 26, height 5\' 5"  (1.651 m), weight 56 kg, SpO2 98 %.    Vent Mode: PSV;CPAP FiO2 (%):  [40 %] 40 % Set Rate:  [22 bmp] 22 bmp Vt Set:  [490 mL] 490 mL PEEP:  [5 cmH20-8 cmH20] 5 cmH20 Pressure Support:  [10 cmH20] 10 cmH20 Plateau Pressure:  [12 cmH20-20 cmH20] 12 cmH20   Intake/Output Summary (Last 24 hours) at 06/25/2022 0758 Last data filed at 06/25/2022 0600 Gross per 24 hour  Intake 3894.2 ml  Output 1725 ml  Net 2169.2 ml   Filed Weights   06/22/22 0500 06/23/22 0709 06/25/22 0500  Weight: 52.5 kg 52.7 kg 56 kg    Examination: Follows commands, can weakly lift head off bed, raise BUE, BLE CTAB, equal chest rise, tachypneic on PS  Resolved Hospital Problem list   N/A  Assessment & Plan:  # Acute hypoxemic and hypercarbic respiratory failure  due to recurrent MG crisis with unclear trigger. Tachypneic on SBT today 10/5.   -full vent support -daily SAT/SBT, likely needs at least another day prior to extubation. It's hard to tell what extent his tachypnea is driven by anxiety vs diaphragmatic weakness.  -continue tube feeds  -home MG medications  -continue second round IVIG per neuro -lasix IV 40   # Hyponatremia - may be pseudohypona given all his IVIG - ctm  # DM2 without hyperglycemia -Sliding Scale insulin   Best Practice (right click and "Reselect all SmartList Selections" daily)   Diet/type: NPO - hold TF while on SBT today on the off chance we can extubate him DVT prophylaxis: prophylactic heparin  GI prophylaxis: PPI Lines: N/A Foley:  Yes, and it is still needed Code Status:  full code Last date of multidisciplinary goals of care discussion [wife updated at bedside]  Labs   CBC: Recent Labs  Lab 06/21/22 1605 06/21/22 1943 06/22/22 0426 06/22/22 0459 06/23/22 1354 06/23/22 1509  WBC 6.0  --   --  8.9  --   --   NEUTROABS 3.8  --   --   --   --   --   HGB 14.2 11.6* 11.2* 11.4* 13.6 12.6*  HCT 40.4 34.0* 33.0* 31.7* 40.0 37.0*  MCV 81.1  --   --  80.7  --   --   PLT 192  --   --  166  --   --     Basic Metabolic Panel: Recent Labs  Lab 06/21/22 1605 06/21/22 1943 06/22/22 0459 06/22/22 1246 06/23/22 0816 06/23/22 1354 06/23/22 1509 06/23/22 1808 06/24/22 0406 06/24/22 1727 06/25/22 0614  NA 129*   < > 130*  --  133* 132* 132*  --  127*  --  129*  K 3.9   < > 3.0*  --  3.5 3.5 3.8  --  2.8*  --  3.4*  CL 92*  --  100  --  99  --   --   --  99  --  103  CO2 25  --  20*  --  25  --   --   --  19*  --  18*  GLUCOSE 126*  --  162*  --  148*  --   --   --  204*  --  171*  BUN 9  --  9  --  9  --   --   --  15  --  13  CREATININE 1.00  --  0.88  --  0.97  --   --   --  0.90  --  0.80  CALCIUM 9.7  --  8.1*  --  8.2*  --   --   --  7.9*  --  7.5*  MG  --   --  1.5*   < > 2.0  --   --  2.0  2.1 2.1 2.0  PHOS  --   --  2.5   < > 3.1  --   --  2.8 2.0* 2.2* 2.2*   < > = values in this interval not displayed.   GFR: Estimated Creatinine Clearance: 72.9 mL/min (by C-G formula based on SCr of 0.8 mg/dL). Recent Labs  Lab 06/21/22 1605 06/22/22 0459  WBC 6.0 8.9    Liver Function Tests: No results for input(s): "AST", "ALT", "ALKPHOS", "BILITOT", "PROT", "ALBUMIN" in the last 168 hours.  No results for input(s): "LIPASE", "AMYLASE" in the last 168 hours. No results for input(s): "AMMONIA" in the last 168 hours.  ABG    Component Value Date/Time   PHART 7.336 (L) 06/23/2022 1509   PCO2ART 38.3 06/23/2022 1509   PO2ART 486 (H) 06/23/2022 1509   HCO3 20.6 06/23/2022 1509   TCO2 22 06/23/2022 1509   ACIDBASEDEF 5.0 (H) 06/23/2022 1509   O2SAT 100 06/23/2022 1509     Coagulation Profile: No results for input(s): "INR", "PROTIME" in the last 168 hours.  Cardiac Enzymes: No results for input(s): "CKTOTAL", "CKMB", "CKMBINDEX", "TROPONINI" in the last 168 hours.  HbA1C: Hemoglobin A1C  Date/Time Value Ref Range Status  02/03/2022 08:31 AM 5.9 (A) 4.0 - 5.6 % Final  05/02/2021 10:16 AM 5.9 (A) 4.0 - 5.6 % Final   Hgb A1c MFr Bld  Date/Time Value Ref Range Status  06/16/2022 07:22 AM 5.7 (H) 4.8 - 5.6 % Final    Comment:    (NOTE) Pre diabetes:          5.7%-6.4%  Diabetes:              >6.4%  Glycemic control for   <7.0% adults with diabetes   09/17/2017 09:47 PM 5.9 (H) 4.8 - 5.6 % Final    Comment:    (NOTE) Pre diabetes:          5.7%-6.4% Diabetes:              >6.4% Glycemic  control for   <7.0% adults with diabetes     CBG: Recent Labs  Lab 06/24/22 1613 06/24/22 1951 06/24/22 2312 06/25/22 0329 06/25/22 0726  GLUCAP 239* 152* 128* 182* 177*    Review of Systems:   Intubated/sedated  Past Medical History:  He,  has a past medical history of Anxiety, Colon polyp, Depressive disorder, not elsewhere classified, External hemorrhoid,  Insomnia, unspecified, Internal hemorrhoids, Myasthenia gravis without exacerbation (HCC), Other and unspecified hyperlipidemia, 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   Surgical History:   Past Surgical History:  Procedure Laterality Date   THYMECTOMY       Social History:   reports that he has never smoked. He has never used smokeless tobacco. He reports that he does not drink alcohol and does not use drugs.   Family History:  His family history is not on file.   Allergies Allergies  Allergen Reactions   Azithromycin Other (See Comments)    REACTION: exac of his mg   Beta Adrenergic Blockers Other (See Comments)    REACTION: exac of mg     Home Medications  Prior to Admission medications   Medication Sig Start Date End Date Taking? Authorizing Provider  ampicillin (PRINCIPEN) 500 MG capsule Take by mouth. 08/07/20   [provider]  calcium carbonate (OS-CAL) 600 MG TABS Take 600 mg by mouth 3 (three) times daily with meals.    [provider]  Cholecalciferol (VITAMIN D) 2000 UNITS tablet Take 2,000 Units by mouth daily.    [provider]  colesevelam (WELCHOL) 625 MG tablet Take 3 tablets (1,875 mg total) by mouth daily. 05/24/22   Shamleffer, Konrad Dolores, MD  desonide (DESONATE) 0.05 % gel Apply 1 application topically 2 (two) times daily as needed.     [provider]  entecavir (BARACLUDE) 1 MG tablet Take 1 tablet (1 mg total) by mouth daily. 07/13/11   Brooke Dare, MD  fluocinonide ointment (LIDEX) 0.05 % APPLY TO AFFECTED AREA(S) OF THE SKIN daily AS NEEDED. By dermatologist. 10/04/21   [place], [name], MD  FLUoxetine (PROZAC) 20 MG capsule TAKE ONE CAPSULE BY MOUTH DAILY 01/25/22   [place], [name], MD  glucose blood  (ONETOUCH ULTRA) test strip USE TO TEST BLOOD SUGAR 3 TIMES DAILY 12/10/20   Romero Belling, MD  ibuprofen (ADVIL) 200 MG tablet Take 400 mg by mouth every 8 (eight) hours as needed for headache or mild pain.    [provider]  Lancets [facility] ULTRASOFT) lancets USE AS INSTRUCTED TO CHECK BLOOD SUGARS THREE TIMES A DAY 01/13/19   Romero Belling, MD  metFORMIN (GLUCOPHAGE) 500 MG tablet Take 2 tablets (1,000 mg total) by mouth 2 (two) times daily. 02/03/22   Shamleffer, Konrad Dolores, MD  olmesartan (BENICAR) 20 MG tablet TAKE ONE TABLET BY MOUTH DAILY *DISCONTINUE DILITIAZEM* 05/28/22   Shamleffer, Konrad Dolores, MD  omeprazole (PRILOSEC) 20 MG capsule TAKE 1 CAPSULE BY MOUTH DAILY 05/29/22   [place], [name], MD  pimecrolimus (ELIDEL) 1 % cream Apply 1 application topically 2 (two) times daily as needed.  01/01/13   [provider]  predniSONE (DELTASONE) 5 MG tablet Take 10 mg by mouth as directed.    [provider]  pyridostigmine (MESTINON) 60 MG tablet Take 60 mg by mouth 3 (three) times daily.  [provider]  repaglinide (PRANDIN) 0.5 MG tablet Take 1 tablet (0.5 mg total) by mouth daily before supper. Patient taking differently: Take 0.5 mg by mouth daily as needed (when BG > 200). 02/03/22   Shamleffer, Konrad Dolores, MD  sitaGLIPtin (JANUVIA) 100 MG tablet Take 1 tablet (100 mg total) by mouth daily. 02/03/22   Shamleffer, Konrad Dolores, MD  zolpidem (AMBIEN) 10 MG tablet TAKE 1/2 TO 1 TABLET (0.5-1) BY MOUTH EVERY NIGHT AT BEDTIME AS NEEDED 05/29/22   [place], [name], MD     Critical care time: 39 minutes    Laroy Apple Pulmonary/Critical Care

## 2022-06-25 NOTE — Progress Notes (Signed)
Neurology Progress Note  Brief HPI: [name] is a 67 y.o. male with a past medical history of eropositive myasthenia gravis, malignant thymoma s/p radiation and resection in 2003,  PTSD, DM2, hepatitis B, anxiety, depression, and insomnia. He follows with Dr. Jeremy Johann at [facility] and saw her last on 06/08/2022. He has received IVIG and PLEX in the past for exacerbations, both with therapeutic effect. At his visit on 5/9 he stated that he felt he is weak all over with head drop, right arm weakness, SOB, chewing difficulty. She increased his prednisone to 10mg  daily and he was admitted to [facility] from 5/17- 5/20 after receiving IVIG 400mg /kg given on 5/17 and then 800mg /kg on 5/18 and 5/19. He felt that he initially improved and then started having trouble again on Tuesday. His wife also states that he started having shortness of breath on Tuesday and then was seen by his PCP this morning and recommended to go to the ED due to worsening SOB and difficulty swallowing. He was intubated in the ED  Subjective: Remains intubated.  Attempts to extubation led to him being extremely hypertensive and tachypneic.  Concern for some amount of anxiety preventing extubation  Vitals:   06/25/22 0900 06/25/22 1000  BP: (!) 155/82 125/75  Pulse: 79 64  Resp: (!) 22 (!) 22  Temp:    SpO2: 92% 99%   General: Intubated, no sedation HEENT: Normocephalic atraumatic CV: Regular rhythm Abdomen nondistended nontender Neurological exam He is awake alert intubated on no sedation He is able to nod yes and no appropriately Appears to participate in the conversation without a problem Cranial nerves: Pupils are equal round react light, right eye ptosis more prominent.  Mild ptosis on the left.  Face symmetric.  Tongue and palate midline. Motor examination with antigravity strength in all fours with mild fatigable weakness in the upper extremities.  Upper extremity strength appeared better than yesterday.  Lower  extremity has much better resistance to force being applied in opposing directions to him.  Less fatigability in the lower extremity. Sensation intact Cerebellar: No gross dysmetria  Labs    Latest Ref Rng & Units 06/23/2022    3:09 PM 06/23/2022    1:54 PM 06/22/2022    4:59 AM  CBC  WBC 4.0 - 10.5 K/uL   8.9   Hemoglobin 13.0 - 17.0 g/dL 60.4  54.0  98.1   Hematocrit 39.0 - 52.0 % 37.0  40.0  31.7   Platelets 150 - 400 K/uL   166       Latest Ref Rng & Units 06/25/2022    6:14 AM 06/24/2022    4:06 AM 06/23/2022    3:09 PM  BMP  Glucose 70 - 99 mg/dL 191  478    BUN 8 - 23 mg/dL 13  15    Creatinine 2.95 - 1.24 mg/dL 6.21  3.08    Sodium 657 - 145 mmol/L 129  127  132   Potassium 3.5 - 5.1 mmol/L 3.4  2.8  3.8   Chloride 98 - 111 mmol/L 103  99    CO2 22 - 32 mmol/L 18  19    Calcium 8.9 - 10.3 mg/dL 7.5  7.9      Assessment:  66/M, hx seropositive myasthenia gravis recently admitted for exacerbation. He improved on IVIG and was discharged to home 3 days ago, at which point his respiratory function was vastly improved although he did have some residual ptosis. He now presents 3 days  after discharge with respiratory distress and NIF -19. Both his inpatient and outpatient neurologists have raised the concerns that some of his symptoms and exacerbations may be more somatoform in etiology 2/2 his severe anxiety 2/2 PTSD may be playing a role.  He was reintubated due to concerns for increasing hypercarbia on his blood gases. He was started on repeat course of IVIG after weighing IVIG versus Plex and deciding not to do plasma exchange. Extubation has been a challenge-unclear if this is more related to anxiety or diaphragmatic weakness/respiratory muscle weakness.  Impression:  MG Exacerbation  Recommendations: - Vent management per PCCM-consider extubation -Continue IVIG 400 mg/kg/day.  Continue for total of 5 doses. - Continue home prednisone - Continue home mestinon 60mg   tid -Consider adding Klonopin  Plan was discussed with Dr.Meier, critical care attending Plan was also discussed with the patient's daughter and wife at bedside.  - Medications that may worsen or trigger MG exacerbation: Class IA antiarrhythmics, magnesium, flouroquinolones, macrolides, aminoglycosides, penicillamine, curare, interferon alpha, botox, quinine. Use with caution: calcium channel blocker, beta blockers and statins.   -- Milon Dikes, MD Neurologist [facility] Pager: [phone]   CRITICAL CARE ATTESTATION Performed by: Milon Dikes, MD Total critical care time: 30 minutes Critical care time was exclusive of separately billable procedures and treating other patients and/or supervising APPs/Residents/Students Critical care was necessary to treat or prevent imminent or life-threatening deterioration. This patient is critically ill and at significant risk for neurological worsening and/or death and care requires constant monitoring. Critical care was time spent personally by me on the following activities: development of treatment plan with patient and/or surrogate as well as nursing, discussions with consultants, evaluation of patient's response to treatment, examination of patient, obtaining history from patient or surrogate, ordering and performing treatments and interventions, ordering and review of laboratory studies, ordering and review of radiographic studies, pulse oximetry, re-evaluation of patient's condition, participation in multidisciplinary rounds and medical decision making of high complexity in the care of this patient.

## 2022-06-25 NOTE — Progress Notes (Signed)
Dr Thora Lance notified pt not agreeable to PICC placement this am and at this time per [facility].  Aware to re order PICC if pt becomes agreeable and PICC needed.

## 2022-06-25 NOTE — Progress Notes (Signed)
An USGPIV (ultrasound guided PIV) has been placed for short-term vasopressor infusion. A correctly placed ivWatch must be used when administering Vasopressors. Should this treatment be needed beyond 72 hours, central line access should be obtained.  It will be the responsibility of the bedside nurse to follow best practice to prevent extravasations.   

## 2022-06-25 NOTE — Progress Notes (Addendum)
Extravasation in right upper arm noted at @ 0130 during bath, pharmacy notified, boarders marked, warm compress applied, distal IVs removed. Iv placed in left arm. IV consulted at 0200 for another ultrasound IV for levo.

## 2022-06-26 ENCOUNTER — Inpatient Hospital Stay (HOSPITAL_COMMUNITY): Payer: Medicare Other

## 2022-06-26 DIAGNOSIS — G7001 Myasthenia gravis with (acute) exacerbation: Secondary | ICD-10-CM | POA: Diagnosis not present

## 2022-06-26 LAB — BASIC METABOLIC PANEL
Anion gap: 11 (ref 5–15)
BUN: 17 mg/dL (ref 8–23)
CO2: 21 mmol/L — ABNORMAL LOW (ref 22–32)
Calcium: 8 mg/dL — ABNORMAL LOW (ref 8.9–10.3)
Chloride: 99 mmol/L (ref 98–111)
Creatinine, Ser: 0.81 mg/dL (ref 0.61–1.24)
GFR, Estimated: >60 mL/min (ref >60)
Glucose, Bld: 154 mg/dL — ABNORMAL HIGH (ref 70–99)
Potassium: 3.8 mmol/L (ref 3.5–5.1)
Sodium: 131 mmol/L — ABNORMAL LOW (ref 135–145)

## 2022-06-26 LAB — BLOOD GAS, ARTERIAL
Acid-base deficit: 1.1 mmol/L (ref 0.0–2.0)
Bicarbonate: 26.1 mmol/L (ref 20.0–28.0)
O2 Saturation: 91.6 %
Patient temperature: 37.3
pCO2 arterial: 54 mmHg — ABNORMAL HIGH (ref 32–48)
pH, Arterial: 7.3 — ABNORMAL LOW (ref 7.35–7.45)
pO2, Arterial: 64 mmHg — ABNORMAL LOW (ref 83–108)

## 2022-06-26 LAB — GLUCOSE, CAPILLARY
Glucose-Capillary: 109 mg/dL — ABNORMAL HIGH (ref 70–99)
Glucose-Capillary: 149 mg/dL — ABNORMAL HIGH (ref 70–99)
Glucose-Capillary: 169 mg/dL — ABNORMAL HIGH (ref 70–99)
Glucose-Capillary: 180 mg/dL — ABNORMAL HIGH (ref 70–99)
Glucose-Capillary: 209 mg/dL — ABNORMAL HIGH (ref 70–99)
Glucose-Capillary: 234 mg/dL — ABNORMAL HIGH (ref 70–99)

## 2022-06-26 LAB — CULTURE, BLOOD (ROUTINE X 2)

## 2022-06-26 LAB — PHOSPHORUS: Phosphorus: 3.4 mg/dL (ref 2.5–4.6)

## 2022-06-26 MED ORDER — MIDAZOLAM HCL 2 MG/2ML IJ SOLN
INTRAMUSCULAR | Status: AC
  Filled 2022-06-26: qty 2

## 2022-06-26 MED ORDER — NICARDIPINE HCL IN NACL 20-0.86 MG/200ML-% IV SOLN
INTRAVENOUS | Status: AC
  Filled 2022-06-26: qty 200

## 2022-06-26 MED ORDER — ACETAMINOPHEN 160 MG/5ML PO SOLN
650.0000 mg | Freq: Four times a day (QID) | ORAL | Status: DC | PRN
  Administered 2022-06-26 – 2022-07-07 (×20): 650 mg
  Filled 2022-06-26 (×20): qty 20.3

## 2022-06-26 MED ORDER — NICARDIPINE HCL IN NACL 20-0.86 MG/200ML-% IV SOLN
0.0000 mg/h | INTRAVENOUS | Status: DC
  Administered 2022-06-26: 5 mg/h via INTRAVENOUS

## 2022-06-26 MED ORDER — POTASSIUM CHLORIDE 20 MEQ PO PACK
60.0000 meq | PACK | Freq: Once | ORAL | Status: AC
  Administered 2022-06-26: 60 meq
  Filled 2022-06-26: qty 3

## 2022-06-26 MED ORDER — PROPOFOL 1000 MG/100ML IV EMUL
0.0000 ug/kg/min | INTRAVENOUS | Status: DC
  Administered 2022-06-26: 5 ug/kg/min via INTRAVENOUS
  Filled 2022-06-26: qty 100

## 2022-06-26 MED ORDER — FENTANYL CITRATE PF 50 MCG/ML IJ SOSY
50.0000 ug | PREFILLED_SYRINGE | Freq: Once | INTRAMUSCULAR | Status: AC
  Administered 2022-06-26: 50 ug via INTRAVENOUS

## 2022-06-26 MED ORDER — SODIUM CHLORIDE 0.9 % IV SOLN
3.0000 g | Freq: Four times a day (QID) | INTRAVENOUS | Status: DC
  Administered 2022-06-27 – 2022-06-28 (×7): 3 g via INTRAVENOUS
  Filled 2022-06-26 (×7): qty 8

## 2022-06-26 MED ORDER — MIDAZOLAM HCL 2 MG/2ML IJ SOLN
2.0000 mg | Freq: Once | INTRAMUSCULAR | Status: AC
  Administered 2022-06-26: 2 mg via INTRAVENOUS

## 2022-06-26 MED ORDER — FUROSEMIDE 10 MG/ML IJ SOLN
40.0000 mg | Freq: Once | INTRAMUSCULAR | Status: AC
  Administered 2022-06-26: 40 mg via INTRAVENOUS
  Filled 2022-06-26: qty 4

## 2022-06-26 MED ORDER — HYDRALAZINE HCL 20 MG/ML IJ SOLN
20.0000 mg | Freq: Once | INTRAMUSCULAR | Status: AC
  Administered 2022-06-26: 20 mg via INTRAVENOUS

## 2022-06-26 MED ORDER — FENTANYL CITRATE PF 50 MCG/ML IJ SOSY: 25.0000 ug | PREFILLED_SYRINGE | INTRAMUSCULAR | Status: DC | PRN

## 2022-06-26 MED ORDER — SODIUM CHLORIDE 0.9 % IV BOLUS
500.0000 mL | Freq: Once | INTRAVENOUS | Status: AC
  Administered 2022-06-26: 500 mL via INTRAVENOUS

## 2022-06-26 MED ORDER — DOCUSATE SODIUM 50 MG/5ML PO LIQD: 100.0000 mg | Freq: Two times a day (BID) | ORAL | Status: DC

## 2022-06-26 MED ORDER — ORAL CARE MOUTH RINSE: 15.0000 mL | Freq: Four times a day (QID) | OROMUCOSAL | Status: DC

## 2022-06-26 MED ORDER — ROCURONIUM BROMIDE 10 MG/ML (PF) SYRINGE
PREFILLED_SYRINGE | INTRAVENOUS | Status: AC
  Filled 2022-06-26: qty 10

## 2022-06-26 MED ORDER — FENTANYL CITRATE PF 50 MCG/ML IJ SOSY
25.0000 ug | PREFILLED_SYRINGE | INTRAMUSCULAR | Status: DC | PRN
  Administered 2022-06-26 – 2022-06-27 (×2): 50 ug via INTRAVENOUS
  Filled 2022-06-26 (×2): qty 1

## 2022-06-26 MED ORDER — POLYETHYLENE GLYCOL 3350 17 G PO PACK: 17.0000 g | PACK | Freq: Every day | ORAL | Status: DC

## 2022-06-26 MED ORDER — FENTANYL CITRATE PF 50 MCG/ML IJ SOSY
PREFILLED_SYRINGE | INTRAMUSCULAR | Status: AC
  Filled 2022-06-26: qty 2

## 2022-06-26 MED ORDER — VECURONIUM BROMIDE 10 MG IV SOLR
INTRAVENOUS | Status: AC
  Filled 2022-06-26: qty 10

## 2022-06-26 MED ORDER — VECURONIUM BROMIDE 10 MG IV SOLR
0.1000 mg/kg | Freq: Once | INTRAVENOUS | Status: AC
  Administered 2022-06-26: 5.6 mg via INTRAVENOUS

## 2022-06-26 MED ORDER — ACETAMINOPHEN 325 MG PO TABS
650.0000 mg | ORAL_TABLET | Freq: Four times a day (QID) | ORAL | Status: DC | PRN
  Filled 2022-06-26 (×2): qty 2

## 2022-06-26 NOTE — Procedures (Signed)
Extubation Procedure Note  Patient Details:   Name: [name] DOB: 1955/08/17 MRN: [id number]   Airway Documentation:    Vent end date: 06/26/22 Vent end time: 1221   Evaluation  O2 sats: stable throughout Complications: No apparent complications Patient did tolerate procedure well. Bilateral Breath Sounds: Clear, Diminished   Yes  Patient extubated to [facility] per MD order. Positive cuff leak. No stridor noted. Vitals are stable on 30L 60%. RN at bedside.  [name] 06/26/2022, 12:22 PM

## 2022-06-26 NOTE — Progress Notes (Signed)
eLink Physician-Brief Progress Note Patient Name: [name] DOB: 08-21-1955 MRN: [id number]   Date of Service  06/26/2022  HPI/Events of Note  Notified of tachypnea, hypertension and SVT.  Pt with myasthenia gravis, extubated earlier today.  SBP in the 240s, HR 150s-160s, looks sinus tachycardia on the monitor.  Pt also unable to cough up secretions.  RR 35-40s, O2 sats 95% on high flow nasal cannula.   Pt denies pain but says he has difficulty breathing.  RT able to do nasotracheal suctioning. Chest PT is being done.  eICU Interventions  Pt given hydralazine with minimal effect.  Start on nicardipine gtt.  Bedside made aware to assess for reintubation.     Intervention Category Intermediate Interventions: Hypertension - evaluation and management;Respiratory distress - evaluation and management  Larinda Buttery 06/26/2022, 8:03 PM

## 2022-06-26 NOTE — Procedures (Signed)
Intubation Procedure Note  [name]  [id number]  [birth year]  Date:06/26/22  Time:9:02 PM   Provider Performing:Zacari Radick B Lynda Capistran    Procedure: Intubation (31500)  Indication(s) Respiratory Failure  Consent Unable to obtain consent due to emergent nature of procedure.   Anesthesia Versed, Fentanyl, and vecuronium   Time Out Verified patient identification, verified procedure, site/side was marked, verified correct patient position, special equipment/implants available, medications/allergies/relevant history reviewed, required imaging and test results available.   Sterile Technique Usual hand hygeine, masks, and gloves were used   Procedure Description Patient positioned in bed supine.  Sedation given as noted above.  Patient was intubated with endotracheal tube using Glidescope.  View was Grade 1 full glottis .  Number of attempts was 1.  Colorimetric CO2 detector was consistent with tracheal placement.   Complications/Tolerance None; patient tolerated the procedure well. Chest X-ray is ordered to verify placement.   EBL none   Specimen(s) None

## 2022-06-26 NOTE — Progress Notes (Signed)
Pharmacy Antibiotic Note  [name] is a 67 y.o. male admitted on 06/21/2022 with pneumonia.  Pharmacy has been consulted for unasyn dosing.  Plan: Unasyn 3g IV q 6 hrs F/u renal function, cultures and clinical course.  Height: 5\' 5"  (165.1 cm) Weight: 56 kg (123 lb 7.3 oz) IBW/kg (Calculated) : 61.5  Temp (24hrs), Avg:98.6 F (37 C), Min:98.2 F (36.8 C), Max:99.2 F (37.3 C)  Recent Labs  Lab 06/21/22 1605 06/22/22 0459 06/23/22 0816 06/24/22 0406 06/25/22 0614 06/26/22 0442  WBC 6.0 8.9  --   --   --   --   CREATININE 1.00 0.88 0.97 0.90 0.80 0.81    Estimated Creatinine Clearance: 72 mL/min (by C-G formula based on SCr of 0.81 mg/dL).    Allergies  Allergen Reactions   Azithromycin Other (See Comments)    REACTION: exac of his mg   Beta Adrenergic Blockers Other (See Comments)    REACTION: exac of mg    Antimicrobials this admission: Unasyn 5/27 >   Dose adjustments this admission:  Microbiology results: 5/25 BCx x 2 >  5/25 Resp virus panel >   Thank you for allowing pharmacy to be a part of this patient's care.  Reece Leader, Colon Flattery, BCCP Clinical Pharmacist  06/26/2022 10:38 PM   [facility] pharmacy phone numbers are listed on [email or web address]

## 2022-06-26 NOTE — Progress Notes (Signed)
eLink Physician-Brief Progress Note Patient Name: [name] DOB: 08-05-1955 MRN: [id number]   Date of Service  06/26/2022  HPI/Events of Note  Pt has been reintubated with BP improved and nicardipine gtt now off.  HR however remains 140s-160s. Pt is sedated on propofol.   eICU Interventions  Give 500cc NS bolus.  Get EKG.      Intervention Category Intermediate Interventions: Arrhythmia - evaluation and management  Larinda Buttery 06/26/2022, 11:00 PM

## 2022-06-26 NOTE — Progress Notes (Signed)
Subjective: Now extubated.   Objective: Current vital signs: BP 121/64   Pulse (!) 58   Temp 98.3 F (36.8 C) (Axillary)   Resp (!) 22   Ht 5\' 5"  (1.651 m)   Wt 56 kg   SpO2 99%   BMI 20.54 kg/m  Vital signs in last 24 hours: Temp:  [97.3 F (36.3 C)-98.4 F (36.9 C)] 98.3 F (36.8 C) (05/27 0400) Pulse Rate:  [52-86] 58 (05/27 0600) Resp:  [19-26] 22 (05/27 0600) BP: (91-185)/(53-116) 121/64 (05/27 0600) SpO2:  [92 %-100 %] 99 % (05/27 0600) FiO2 (%):  [40 %] 40 % (05/27 0305) Weight:  [56 kg] 56 kg (05/27 0453)  Intake/Output from previous day: 05/26 0701 - 05/27 0700 In: 2317.9 [I.V.:545.8; NG/GT:1210; IV Piggyback:562.2] Out: 2725 [Urine:2725] Intake/Output this shift: No intake/output data recorded. Nutritional status:  Diet Order             Diet NPO time specified  Diet effective now                  HEENT: [place]/AT Lungs: Respirations unlabored  Neurologic Exam: Ment: Awake, alert and oriented. Follows all commands. No receptive or expressive aphasia noted.  CN: PERRL. Left eye with full EOM. Right eye with incomplete adduction and lags left eye with EOM. Right ptosis is present. + myasthenic snarl when asked to smile. Hypophonic.  Motor: 4/5 BUE proximally and distally 4/5 BLE proximally and distally Normal muscle bulk and tone.  Sensory: Intact to FT x 4 Reflexes: 1+ bilateral brachioradialis, biceps, triceps and patellae Cerebellar: No ataxia with FNF bilaterally  Lab Results: Results for orders placed or performed during the hospital encounter of 06/21/22 (from the past 48 hour(s))  Glucose, capillary     Status: Abnormal   Collection Time: 06/24/22  8:21 AM  Result Value Ref Range   Glucose-Capillary 172 (H) 70 - 99 mg/dL    Comment: Glucose reference range applies only to samples taken after fasting for at least 8 hours.  Osmolality     Status: None   Collection Time: 06/24/22 11:02 AM  Result Value Ref Range   Osmolality 284 275 - 295  mOsm/kg    Comment: Performed at [facility], [address], [place], [place] [address]  Urinalysis, Complete w Microscopic -Urine, Catheterized; Indwelling urinary catheter     Status: Abnormal   Collection Time: 06/24/22 11:06 AM  Result Value Ref Range   Color, Urine YELLOW YELLOW   APPearance CLEAR CLEAR   Specific Gravity, Urine 1.014 1.005 - 1.030   pH 6.0 5.0 - 8.0   Glucose, UA >=500 (A) NEGATIVE mg/dL   Hgb urine dipstick NEGATIVE NEGATIVE   Bilirubin Urine NEGATIVE NEGATIVE   Ketones, ur 20 (A) NEGATIVE mg/dL   Protein, ur 30 (A) NEGATIVE mg/dL   Nitrite NEGATIVE NEGATIVE   Leukocytes,Ua NEGATIVE NEGATIVE   RBC / HPF 6-10 0 - 5 RBC/hpf   WBC, UA 11-20 0 - 5 WBC/hpf   Bacteria, UA RARE (A) NONE SEEN   Squamous Epithelial / HPF 0-5 0 - 5 /HPF   Mucus PRESENT     Comment: Performed at [facility], [address], [place], [place] [address]  Respiratory (~20 pathogens) panel by PCR     Status: None   Collection Time: 06/24/22 11:06 AM   Specimen: Nasopharyngeal Swab; Respiratory  Result Value Ref Range   Adenovirus NOT DETECTED NOT DETECTED   Coronavirus 229E NOT DETECTED NOT DETECTED  Comment: (NOTE) The Coronavirus on the Respiratory Panel, DOES NOT test for the novel  Coronavirus (2019 nCoV)    Coronavirus HKU1 NOT DETECTED NOT DETECTED   Coronavirus NL63 NOT DETECTED NOT DETECTED   Coronavirus OC43 NOT DETECTED NOT DETECTED   Metapneumovirus NOT DETECTED NOT DETECTED   Rhinovirus / Enterovirus NOT DETECTED NOT DETECTED   Influenza A NOT DETECTED NOT DETECTED   Influenza B NOT DETECTED NOT DETECTED   Parainfluenza Virus 1 NOT DETECTED NOT DETECTED   Parainfluenza Virus 2 NOT DETECTED NOT DETECTED   Parainfluenza Virus 3 NOT DETECTED NOT DETECTED   Parainfluenza Virus 4 NOT DETECTED NOT DETECTED   Respiratory Syncytial Virus NOT DETECTED NOT DETECTED   Bordetella pertussis NOT DETECTED NOT DETECTED   Bordetella Parapertussis NOT DETECTED NOT  DETECTED   Chlamydophila pneumoniae NOT DETECTED NOT DETECTED   Mycoplasma pneumoniae NOT DETECTED NOT DETECTED    Comment: Performed at [facility], [address], [place], [place] [address]  Glucose, capillary     Status: Abnormal   Collection Time: 06/24/22 12:22 PM  Result Value Ref Range   Glucose-Capillary 221 (H) 70 - 99 mg/dL    Comment: Glucose reference range applies only to samples taken after fasting for at least 8 hours.  Glucose, capillary     Status: Abnormal   Collection Time: 06/24/22  4:13 PM  Result Value Ref Range   Glucose-Capillary 239 (H) 70 - 99 mg/dL    Comment: Glucose reference range applies only to samples taken after fasting for at least 8 hours.  Magnesium     Status: None   Collection Time: 06/24/22  5:27 PM  Result Value Ref Range   Magnesium 2.1 1.7 - 2.4 mg/dL    Comment: Performed at [facility], [address], [place], [place] [address]  Phosphorus     Status: Abnormal   Collection Time: 06/24/22  5:27 PM  Result Value Ref Range   Phosphorus 2.2 (L) 2.5 - 4.6 mg/dL    Comment: Performed at [facility], [address] [place], [place] [address]  Glucose, capillary     Status: Abnormal   Collection Time: 06/24/22  7:51 PM  Result Value Ref Range   Glucose-Capillary 152 (H) 70 - 99 mg/dL    Comment: Glucose reference range applies only to samples taken after fasting for at least 8 hours.  Glucose, capillary     Status: Abnormal   Collection Time: 06/24/22 11:12 PM  Result Value Ref Range   Glucose-Capillary 128 (H) 70 - 99 mg/dL    Comment: Glucose reference range applies only to samples taken after fasting for at least 8 hours.  Glucose, capillary     Status: Abnormal   Collection Time: 06/25/22  3:29 AM  Result Value Ref Range   Glucose-Capillary 182 (H) 70 - 99 mg/dL    Comment: Glucose reference range applies only to samples taken after fasting for at least 8 hours.  Magnesium     Status: None   Collection Time:  06/25/22  6:14 AM  Result Value Ref Range   Magnesium 2.0 1.7 - 2.4 mg/dL    Comment: Performed at [facility], [address] [place], [place] [address]  Phosphorus     Status: Abnormal   Collection Time: 06/25/22  6:14 AM  Result Value Ref Range   Phosphorus 2.2 (L) 2.5 - 4.6 mg/dL    Comment: Performed at [facility], [address], [place], [place] [address]  Basic metabolic panel     Status: Abnormal   Collection Time: 06/25/22  6:14 AM  Result Value Ref Range   Sodium 129 (L) 135 - 145 mmol/L   Potassium 3.4 (L) 3.5 - 5.1 mmol/L   Chloride 103 98 - 111 mmol/L   CO2 18 (L) 22 - 32 mmol/L   Glucose, Bld 171 (H) 70 - 99 mg/dL    Comment: Glucose reference range applies only to samples taken after fasting for at least 8 hours.   BUN 13 8 - 23 mg/dL   Creatinine, Ser 1.91 0.61 - 1.24 mg/dL   Calcium 7.5 (L) 8.9 - 10.3 mg/dL   GFR, Estimated >47 >82 mL/min    Comment: (NOTE) Calculated using the CKD-EPI Creatinine Equation (2021)    Anion gap 8 5 - 15    Comment: Performed at [facility], [address], [place], [place] [address]  T4, free     Status: None   Collection Time: 06/25/22  6:14 AM  Result Value Ref Range   Free T4 1.02 0.61 - 1.12 ng/dL    Comment: (NOTE) Biotin ingestion may interfere with free T4 tests. If the results are inconsistent with the TSH level, previous test results, or the clinical presentation, then consider biotin interference. If needed, order repeat testing after stopping biotin. Performed at [facility], [address] [place], [place] [address]   Glucose, capillary     Status: Abnormal   Collection Time: 06/25/22  7:26 AM  Result Value Ref Range   Glucose-Capillary 177 (H) 70 - 99 mg/dL    Comment: Glucose reference range applies only to samples taken after fasting for at least 8 hours.  Glucose, capillary     Status: Abnormal   Collection Time: 06/25/22 11:57 AM  Result Value Ref Range   Glucose-Capillary  210 (H) 70 - 99 mg/dL    Comment: Glucose reference range applies only to samples taken after fasting for at least 8 hours.  Glucose, capillary     Status: Abnormal   Collection Time: 06/25/22  3:44 PM  Result Value Ref Range   Glucose-Capillary 226 (H) 70 - 99 mg/dL    Comment: Glucose reference range applies only to samples taken after fasting for at least 8 hours.  Glucose, capillary     Status: Abnormal   Collection Time: 06/25/22  8:05 PM  Result Value Ref Range   Glucose-Capillary 164 (H) 70 - 99 mg/dL    Comment: Glucose reference range applies only to samples taken after fasting for at least 8 hours.  Glucose, capillary     Status: Abnormal   Collection Time: 06/25/22 11:41 PM  Result Value Ref Range   Glucose-Capillary 101 (H) 70 - 99 mg/dL    Comment: Glucose reference range applies only to samples taken after fasting for at least 8 hours.  Glucose, capillary     Status: Abnormal   Collection Time: 06/26/22  3:45 AM  Result Value Ref Range   Glucose-Capillary 169 (H) 70 - 99 mg/dL    Comment: Glucose reference range applies only to samples taken after fasting for at least 8 hours.  Basic metabolic panel     Status: Abnormal   Collection Time: 06/26/22  4:42 AM  Result Value Ref Range   Sodium 131 (L) 135 - 145 mmol/L   Potassium 3.8 3.5 - 5.1 mmol/L   Chloride 99 98 - 111 mmol/L   CO2 21 (  L) 22 - 32 mmol/L   Glucose, Bld 154 (H) 70 - 99 mg/dL    Comment: Glucose reference range applies only to samples taken after fasting for at least 8 hours.   BUN 17 8 - 23 mg/dL   Creatinine, Ser 1.61 0.61 - 1.24 mg/dL   Calcium 8.0 (L) 8.9 - 10.3 mg/dL   GFR, Estimated >09 >60 mL/min    Comment: (NOTE) Calculated using the CKD-EPI Creatinine Equation (2021)    Anion gap 11 5 - 15    Comment: Performed at [facility], [address], [place], [place] [address]  Phosphorus     Status: None   Collection Time: 06/26/22  4:42 AM  Result Value Ref Range   Phosphorus 3.4 2.5 -  4.6 mg/dL    Comment: Performed at [facility], [address] [place], [place] [address]    Recent Results (from the past 240 hour(s))  SARS Coronavirus 2 by RT PCR (hospital order, performed in [facility] hospital lab) *cepheid single result test* Anterior Nasal Swab     Status: None   Collection Time: 06/21/22  5:41 PM   Specimen: Anterior Nasal Swab  Result Value Ref Range Status   SARS Coronavirus 2 by RT PCR NEGATIVE NEGATIVE Final    Comment: Performed at [facility], [address] [place], [place] [address]  MRSA Next Gen by PCR, Nasal     Status: None   Collection Time: 06/21/22  8:26 PM   Specimen: Nasal Mucosa; Nasal Swab  Result Value Ref Range Status   MRSA by PCR Next Gen NOT DETECTED NOT DETECTED Final    Comment: (NOTE) The GeneXpert MRSA Assay (FDA approved for NASAL specimens only), is one component of a comprehensive MRSA colonization surveillance program. It is not intended to diagnose MRSA infection nor to guide or monitor treatment for MRSA infections. Test performance is not FDA approved in patients less than 35 years old. Performed at [facility], [address] [place], [place] [address]   Culture, blood (Routine X 2) w Reflex to ID Panel     Status: None (Preliminary result)   Collection Time: 06/24/22  4:06 AM   Specimen: BLOOD LEFT ARM  Result Value Ref Range Status   Specimen Description BLOOD LEFT ARM  Final   Special Requests   Final    BOTTLES DRAWN AEROBIC AND ANAEROBIC Blood Culture results may not be optimal due to an excessive volume of blood received in culture bottles   Culture   Final    NO GROWTH 1 DAY Performed at [facility], [address], [place], [place] [address]    Report Status PENDING  Incomplete  Culture, blood (Routine X 2) w Reflex to ID Panel     Status: None (Preliminary result)   Collection Time: 06/24/22  4:06 AM   Specimen: BLOOD LEFT ARM  Result Value Ref Range Status   Specimen  Description BLOOD LEFT ARM  Final   Special Requests   Final    BOTTLES DRAWN AEROBIC AND ANAEROBIC Blood Culture results may not be optimal due to an excessive volume of blood received in culture bottles   Culture   Final    NO GROWTH 1 DAY Performed at [facility], [address], [place], [place] [address]    Report Status PENDING  Incomplete  Culture, Respiratory w Gram Stain     Status: None (Preliminary result)  Collection Time: 06/24/22  4:31 AM   Specimen: Tracheal Aspirate; Respiratory  Result Value Ref Range Status   Specimen Description TRACHEAL ASPIRATE  Final   Special Requests NONE  Final   Gram Stain   Final    ABUNDANT WBC PRESENT,BOTH PMN AND MONONUCLEAR RARE GRAM POSITIVE COCCI IN PAIRS    Culture   Final    FEW STAPHYLOCOCCUS AUREUS SUSCEPTIBILITIES TO FOLLOW Performed at [facility], [address], [place], [place] [address]    Report Status PENDING  Incomplete  Respiratory (~20 pathogens) panel by PCR     Status: None   Collection Time: 06/24/22 11:06 AM   Specimen: Nasopharyngeal Swab; Respiratory  Result Value Ref Range Status   Adenovirus NOT DETECTED NOT DETECTED Final   Coronavirus 229E NOT DETECTED NOT DETECTED Final    Comment: (NOTE) The Coronavirus on the Respiratory Panel, DOES NOT test for the novel  Coronavirus (2019 nCoV)    Coronavirus HKU1 NOT DETECTED NOT DETECTED Final   Coronavirus NL63 NOT DETECTED NOT DETECTED Final   Coronavirus OC43 NOT DETECTED NOT DETECTED Final   Metapneumovirus NOT DETECTED NOT DETECTED Final   Rhinovirus / Enterovirus NOT DETECTED NOT DETECTED Final   Influenza A NOT DETECTED NOT DETECTED Final   Influenza B NOT DETECTED NOT DETECTED Final   Parainfluenza Virus 1 NOT DETECTED NOT DETECTED Final   Parainfluenza Virus 2 NOT DETECTED NOT DETECTED Final   Parainfluenza Virus 3 NOT DETECTED NOT DETECTED Final   Parainfluenza Virus 4 NOT DETECTED NOT DETECTED Final   Respiratory Syncytial Virus NOT  DETECTED NOT DETECTED Final   Bordetella pertussis NOT DETECTED NOT DETECTED Final   Bordetella Parapertussis NOT DETECTED NOT DETECTED Final   Chlamydophila pneumoniae NOT DETECTED NOT DETECTED Final   Mycoplasma pneumoniae NOT DETECTED NOT DETECTED Final    Comment: Performed at [facility], [address], [place], [place] [address]    Lipid Panel No results for input(s): "CHOL", "TRIG", "HDL", "CHOLHDL", "VLDL", "LDLCALC" in the last 72 hours.  Studies/Results: [place] EKG SITE RITE  Result Date: 06/25/2022 If Site Rite image not attached, placement could not be confirmed due to current cardiac rhythm.  DG CHEST PORT 1 VIEW  Result Date: 06/24/2022 CLINICAL DATA:  Fever. EXAM: PORTABLE CHEST 1 VIEW COMPARISON:  Radiograph yesterday. CT 06/21/2022 FINDINGS: The endotracheal tube tip is 3.8 cm from the carina. Weighted enteric tube tip below the diaphragm not included in the field of view. No evidence of focal airspace disease. Stable heart size and mediastinal contours. No significant pleural effusion or pneumothorax. Normal pulmonary vasculature. Stable osseous structures. IMPRESSION: 1. Endotracheal tube tip 3.8 cm from the carina. 2. No focal airspace disease or explanation for fever. Electronically Signed   By: Narda Rutherford M.D.   On: 06/24/2022 19:24    Medications: Scheduled:  Chlorhexidine Gluconate Cloth  6 each Topical Daily   clonazepam  0.25 mg Per Tube BID   docusate  100 mg Per Tube BID   famotidine  20 mg Per Tube BID   heparin  5,000 Units Subcutaneous Q8H   insulin aspart  0-20 Units Subcutaneous Q4H   mouth rinse  15 mL Mouth Rinse Q2H   polyethylene glycol  17 g Per Tube Daily   predniSONE  10 mg Per Tube Q breakfast   pyridostigmine  60 mg Per Tube Q8H   Continuous:  sodium chloride Stopped (06/22/22 0637)   sodium chloride 10 mL/hr at 06/26/22 0400  dexmedetomidine (PRECEDEX) IV infusion 0.5 mcg/kg/hr (06/26/22 0400)   feeding supplement (VITAL AF 1.2  CAL) 55 mL/hr at 06/26/22 0400   Immune Globulin 10% Stopped (06/25/22 2023)   norepinephrine (LEVOPHED) Adult infusion Stopped (06/25/22 1610)   Assessment: 66/M, hx seropositive myasthenia gravis recently admitted for exacerbation. He improved on IVIG and was discharged to home on 5/20, at which point his respiratory function was vastly improved although he did have some residual ptosis. He then re-presented on 5/22, 3 days after discharge, with respiratory distress and NIF -19. He was reintubated in the ED due to concerns for increasing hypercarbia on his blood gases. He was started on a repeat course of IVIG after weighing IVIG versus PLEX. Extubation has been a challenge-unclear if this is more related to anxiety or diaphragmatic weakness/respiratory muscle weakness.  - Exam today reveals 4/5 strength x 4 with unlabored respirations. Awake, alert and fully oriented.  - Now extubated - Both his inpatient and outpatient neurologists have raised the concerns that some of his symptoms and exacerbations may be more somatoform in etiology 2/2 his severe anxiety 2/2 PTSD may be playing a role.   Impression:  MG exacerbation with possible component to his presentation secondary to somatoform disorder   Recommendations: - Continue IVIG 400 mg/kg/day. Today is day 4/5. Last dose will be on Tuesday. - Continue home prednisone - Continue home Mestinon 60 mg tid - Consider adding Klonopin for anxiety - Medications that may worsen or trigger MG exacerbation: Class IA antiarrhythmics, magnesium, flouroquinolones, macrolides, aminoglycosides, penicillamine, curare, interferon alpha, botox, quinine. Use with caution: calcium channel blockers, beta blockers and statins.     LOS: 5 days   @Electronically  signed: Dr. Caryl Pina 06/26/2022  7:22 AM

## 2022-06-26 NOTE — Progress Notes (Signed)
PCCM Update:  Called to the bedside due to hypertension, tacycardia into th 170-180s and tachypnea. Talked with patient's wife and patient. He is having trouble breathing, taking rapid shallow breaths with RR in the 40s. He is ok with reintubation.   Melody Comas, MD [facility] Office: [phone]   See Amion for personal pager PCCM on call pager [phone] until 7pm. Please call Elink 7p-7a. [phone]

## 2022-06-26 NOTE — Evaluation (Signed)
Occupational Therapy Evaluation Patient Details Name: [name] MRN: [id number] DOB: [birth year] Today's Date: 06/26/2022   History of Present Illness Pt is a 67 y.o. M who presents 06/16/2022 with complaints of myasthenia gravis flare; over the past 4 weeks he has been having worsening ptosis in the right eye, right face and arm weakness, difficulty swallowing, difficulty breathing. Significant PMH: myasthenia gravis, hepatitis B, T2DM, HTN.   Clinical Impression   Prior to this admission, patient living with his wife, able to complete ADLs (with extra time since MG exacerbation on 06/16/22) and walking without AD. Currently, patient presenting with generalized weakness, R eye ptosis, and need for increased assist to complete ADLs. Patient able to complete sit<>stands x2 from EOB with PT and OT (patient still intubated at time of eval) and is currently mod A for ADL management. Patient will likely progress well once extubated. OT recommending HHOT at discharge, but may progress past needing due to current status. OT will continue to follow.      Recommendations for follow up therapy are one component of a multi-disciplinary discharge planning process, led by the attending physician.  Recommendations may be updated based on patient status, additional functional criteria and insurance authorization.   Assistance Recommended at Discharge Frequent or constant Supervision/Assistance (initally)  Patient can return home with the following A little help with walking and/or transfers;A little help with bathing/dressing/bathroom;Assistance with cooking/housework;Direct supervision/assist for medications management;Direct supervision/assist for financial management;Assist for transportation;Help with stairs or ramp for entrance    Functional Status Assessment  Patient has had a recent decline in their functional status and demonstrates the ability to make significant improvements in function in a  reasonable and predictable amount of time.  Equipment Recommendations   (will continue to assess)    Recommendations for Other Services       Precautions / Restrictions Precautions Precautions: Fall Precaution Comments: on vent this session, coretrak Restrictions Weight Bearing Restrictions: No      Mobility Bed Mobility Overal bed mobility: Needs Assistance Bed Mobility: Supine to Sit, Sit to Supine     Supine to sit: Min assist, +2 for physical assistance, +2 for safety/equipment Sit to supine: Mod assist, +2 for physical assistance, +2 for safety/equipment   General bed mobility comments: assist for lines with vent tubing, coretrack, IV, condom cath    Transfers Overall transfer level: Needs assistance Equipment used: 2 person hand held assist Transfers: Sit to/from Stand Sit to Stand: Min assist, +2 safety/equipment, +2 physical assistance           General transfer comment: stood at bedside wtih assist for balance and multiple lines      Balance Overall balance assessment: Needs assistance Sitting-balance support: Feet supported, Feet unsupported Sitting balance-Leahy Scale: Fair Sitting balance - Comments: holding on when feet not supported, no UE support when feet on the floor   Standing balance support: Bilateral upper extremity supported Standing balance-Leahy Scale: Poor Standing balance comment: supported in standing for safety with multiple lines, etc                           ADL either performed or assessed with clinical judgement   ADL Overall ADL's : Needs assistance/impaired Eating/Feeding: NPO   Grooming: Minimal assistance;Sitting   Upper Body Bathing: Minimal assistance;Sitting   Lower Body Bathing: Moderate assistance;Sitting/lateral leans;Sit to/from stand   Upper Body Dressing : Minimal assistance;Sitting   Lower Body Dressing: Moderate assistance;Sit to/from stand;Sitting/lateral leans  Toilet Transfer: Moderate  assistance;+2 for physical assistance;+2 for safety/equipment;Stand-pivot   Toileting- Clothing Manipulation and Hygiene: Total assistance;Bed level       Functional mobility during ADLs: Moderate assistance General ADL Comments: Patient presenting with myasthenia gravis exacerbation, generalized weakness, R eye ptosis, and need for increased assist to complete ADLs.     Vision Baseline Vision/History: 1 Wears glasses (Reading) Ability to See in Adequate Light: 0 Adequate Patient Visual Report: No change from baseline Vision Assessment?: Yes Eye Alignment: Within Functional Limits Ocular Range of Motion: Within Functional Limits Alignment/Gaze Preference: Within Defined Limits Tracking/Visual Pursuits: Able to track stimulus in all quads without difficulty Convergence: Within functional limits Visual Fields: No apparent deficits Additional Comments: R eye ptosis, not fully closed     Perception     Praxis      Pertinent Vitals/Pain Pain Assessment Pain Assessment: CPOT Facial Expression: Relaxed, neutral Body Movements: Absence of movements Muscle Tension: Relaxed Compliance with ventilator (intubated pts.): Tolerating ventilator or movement Vocalization (extubated pts.): N/A CPOT Total: 0 Pain Intervention(s): Limited activity within patient's tolerance, Monitored during session, Repositioned     Hand Dominance Left   Extremity/Trunk Assessment Upper Extremity Assessment Upper Extremity Assessment: Generalized weakness RUE Deficits / Details: very close to full strength LUE Deficits / Details: very close to full strength   Lower Extremity Assessment Lower Extremity Assessment: Defer to PT evaluation   Cervical / Trunk Assessment Cervical / Trunk Assessment: Normal   Communication Communication Communication: Other (comment) (intubated)   Cognition Arousal/Alertness: Awake/alert Behavior During Therapy: WFL for tasks assessed/performed Overall Cognitive  Status: Difficult to assess                                       General Comments  on PS/CPAP 40% FiO2 PEEP of 5; noted R eye ptosis and some limited cervical extension, VSS throughout, wife and daughter present; RN reports plans to attempt extubation later today.  End of session pt requesting bedpan and assisted to roll and place, RN aware    Exercises     Shoulder Instructions      Home Living Family/patient expects to be discharged to:: Private residence Living Arrangements: Spouse/significant other Available Help at Discharge: Family Type of Home: House Home Access: Level entry Entrance Stairs-Number of Steps: 0 Entrance Stairs-Rails: None Home Layout: One level     Bathroom Shower/Tub: Chief Strategy Officer: Standard     Home Equipment: None          Prior Functioning/Environment Prior Level of Function : Independent/Modified Independent             Mobility Comments: Retired ADLs Comments: Could complete even with most recent exacerbation        OT Problem List: Decreased strength;Decreased activity tolerance;Impaired balance (sitting and/or standing);Decreased coordination;Decreased cognition;Decreased safety awareness      OT Treatment/Interventions: Self-care/ADL training;Therapeutic exercise;Neuromuscular education;Energy conservation;DME and/or AE instruction;Manual therapy;Therapeutic activities;Cognitive remediation/compensation;Visual/perceptual remediation/compensation;Patient/family education;Balance training    OT Goals(Current goals can be found in the care plan section) Acute Rehab OT Goals Patient Stated Goal: unable to state OT Goal Formulation: Patient unable to participate in goal setting Time For Goal Achievement: 07/10/22 Potential to Achieve Goals: Good  OT Frequency: Min 2X/week    Co-evaluation PT/OT/SLP Co-Evaluation/Treatment: Yes Reason for Co-Treatment: Complexity of the patient's impairments  (multi-system involvement);Necessary to address cognition/behavior during functional activity;For patient/therapist safety;To address functional/ADL transfers PT  goals addressed during session: Mobility/safety with mobility;Balance OT goals addressed during session: ADL's and self-care;Strengthening/ROM      AM-PAC OT "6 Clicks" Daily Activity     Outcome Measure Help from another person eating meals?: Total (NPO) Help from another person taking care of personal grooming?: A Little Help from another person toileting, which includes using toliet, bedpan, or urinal?: A Lot Help from another person bathing (including washing, rinsing, drying)?: A Lot Help from another person to put on and taking off regular upper body clothing?: A Little Help from another person to put on and taking off regular lower body clothing?: A Lot 6 Click Score: 13   End of Session Nurse Communication: Mobility status  Activity Tolerance: Patient tolerated treatment well Patient left: in bed;with call bell/phone within reach;with nursing/sitter in room  OT Visit Diagnosis: Unsteadiness on feet (R26.81);Other abnormalities of gait and mobility (R26.89);Muscle weakness (generalized) (M62.81)                Time: 0981-1914 OT Time Calculation (min): 24 min Charges:  OT General Charges $OT Visit: 1 Visit OT Evaluation $OT Eval Moderate Complexity: 1 Mod  Pollyann Glen E. Maleiah Dula, OTR/L Acute Rehabilitation Services [phone]   Cherlyn Cushing 06/26/2022, 3:28 PM

## 2022-06-26 NOTE — Progress Notes (Signed)
eLink Physician-Brief Progress Note Patient Name: [name] DOB: 08/01/1955 MRN: [id number]   Date of Service  06/26/2022  HPI/Events of Note  67 year old male with myasthenia gravis exacerbation requesting Tylenol for headache.  Tylenol order expired overnight.  eICU Interventions  Tylenol ordered renewed     Intervention Category Evaluation Type: Other  Carilyn Goodpasture 06/26/2022, 5:06 AM

## 2022-06-26 NOTE — Evaluation (Signed)
Physical Therapy Evaluation Patient Details Name: [name] MRN: [id number] DOB: 1955-11-28 Today's Date: 06/26/2022  History of Present Illness  Pt is a 67 y.o. M who presents 06/16/2022 with complaints of myasthenia gravis flare; over the past 4 weeks he has been having worsening ptosis in the right eye, right face and arm weakness, difficulty swallowing, difficulty breathing. Significant PMH: myasthenia gravis, hepatitis B, T2DM, HTN.  Clinical Impression  Patient presents with decreased mobility due to generalized weakness, decreased balance, decreased cardiopulmonary endurance currently still intubated but able to sit EOB and stand at bedside to take side steps with min A of 2.  Patient previously mod I with mobility in the home despite head/neck weakness, R eye ptosis and respiratory muscles affected with Myasthenia Gravis exacerbation.  PT will continue to follow in the acute setting.  Anticipate progress to allow home with wife support and possible need for HHPT.         Recommendations for follow up therapy are one component of a multi-disciplinary discharge planning process, led by the attending physician.  Recommendations may be updated based on patient status, additional functional criteria and insurance authorization.  Follow Up Recommendations       Assistance Recommended at Discharge Intermittent Supervision/Assistance  Patient can return home with the following  A little help with walking and/or transfers;Help with stairs or ramp for entrance;A little help with bathing/dressing/bathroom    Equipment Recommendations Other (comment) (TBA pending progress)  Recommendations for Other Services       Functional Status Assessment Patient has had a recent decline in their functional status and demonstrates the ability to make significant improvements in function in a reasonable and predictable amount of time.     Precautions / Restrictions Precautions Precautions:  Fall Precaution Comments: on vent this session, coretrak      Mobility  Bed Mobility Overal bed mobility: Needs Assistance Bed Mobility: Supine to Sit, Sit to Supine     Supine to sit: Min assist, +2 for physical assistance, +2 for safety/equipment Sit to supine: Mod assist, +2 for physical assistance, +2 for safety/equipment   General bed mobility comments: assist for lines with vent tubing, coretrack, IV, condom cath    Transfers Overall transfer level: Needs assistance Equipment used: 2 person hand held assist Transfers: Sit to/from Stand Sit to Stand: Min assist, +2 safety/equipment, +2 physical assistance           General transfer comment: stood at bedside wtih assist for balance and multiple lines    Ambulation/Gait Ambulation/Gait assistance: Min assist, +2 physical assistance, +2 safety/equipment Gait Distance (Feet): 2 Feet Assistive device: 2 person hand held assist         General Gait Details: side steps to [facility]  Stairs            Wheelchair Mobility    Modified Rankin (Stroke Patients Only)       Balance Overall balance assessment: Needs assistance Sitting-balance support: Feet supported, Feet unsupported Sitting balance-Leahy Scale: Fair Sitting balance - Comments: holding on when feet not supported, no UE support when feet on the floor   Standing balance support: Bilateral upper extremity supported Standing balance-Leahy Scale: Poor Standing balance comment: supported in standing for safety with multiple lines, etc                             Pertinent Vitals/Pain Pain Assessment Pain Assessment: CPOT Facial Expression: Relaxed, neutral Body Movements: Absence of movements  Muscle Tension: Relaxed Compliance with ventilator (intubated pts.): Tolerating ventilator or movement Vocalization (extubated pts.): N/A CPOT Total: 0 Pain Intervention(s): Monitored during session    Home Living Family/patient expects to be  discharged to:: Private residence Living Arrangements: Spouse/significant other Available Help at Discharge: Family Type of Home: House Home Access: Level entry Entrance Stairs-Rails: None Entrance Stairs-Number of Steps: 0   Home Layout: One level Home Equipment: None      Prior Function Prior Level of Function : Independent/Modified Independent             Mobility Comments: Retired ADLs Comments: Could complete even with most recent exacerbation     Hand Dominance        Extremity/Trunk Assessment   Upper Extremity Assessment Upper Extremity Assessment: Defer to OT evaluation    Lower Extremity Assessment Lower Extremity Assessment: Overall WFL for tasks assessed    Cervical / Trunk Assessment Cervical / Trunk Assessment: Normal  Communication   Communication: Other (comment) (intubated)  Cognition Arousal/Alertness: Awake/alert Behavior During Therapy: WFL for tasks assessed/performed Overall Cognitive Status: Difficult to assess                                          General Comments General comments (skin integrity, edema, etc.): on PS/CPAP 40% FiO2 PEEP of 5; noted R eye ptosis and some limited cervical extension, VSS throughout, wife and daughter present; RN reports plans to attempt extubation later today.  End of session pt requesting bedpan and assisted to roll and place, RN aware    Exercises     Assessment/Plan    PT Assessment Patient needs continued PT services  PT Problem List Decreased activity tolerance;Cardiopulmonary status limiting activity;Decreased balance;Decreased mobility       PT Treatment Interventions DME instruction;Functional mobility training;Balance training;Gait training;Therapeutic exercise;Stair training;Therapeutic activities;Patient/family education    PT Goals (Current goals can be found in the Care Plan section)  Acute Rehab PT Goals Patient Stated Goal: return to independent PT Goal  Formulation: With patient/family Time For Goal Achievement: 07/10/22 Potential to Achieve Goals: Good    Frequency Min 3X/week     Co-evaluation PT/OT/SLP Co-Evaluation/Treatment: Yes Reason for Co-Treatment: Complexity of the patient's impairments (multi-system involvement);Necessary to address cognition/behavior during functional activity;For patient/therapist safety;To address functional/ADL transfers PT goals addressed during session: Mobility/safety with mobility;Balance         AM-PAC PT "6 Clicks" Mobility  Outcome Measure Help needed turning from your back to your side while in a flat bed without using bedrails?: A Lot Help needed moving from lying on your back to sitting on the side of a flat bed without using bedrails?: A Lot Help needed moving to and from a bed to a chair (including a wheelchair)?: A Lot Help needed standing up from a chair using your arms (e.g., wheelchair or bedside chair)?: A Lot Help needed to walk in hospital room?: Total Help needed climbing 3-5 steps with a railing? : Total 6 Click Score: 10    End of Session Equipment Utilized During Treatment: Gait belt Activity Tolerance: Patient limited by fatigue Patient left: in bed;with call bell/phone within reach;with family/visitor present   PT Visit Diagnosis: Muscle weakness (generalized) (M62.81);Other symptoms and signs involving the nervous system (U98.119)    Time: 1478-2956 PT Time Calculation (min) (ACUTE ONLY): 24 min   Charges:   PT Evaluation $PT Eval Moderate Complexity: 1 Mod  Sheran Lawless, PT Acute Rehabilitation Services Office:[phone] 06/26/2022   Elray Mcgregor 06/26/2022, 1:19 PM

## 2022-06-26 NOTE — Progress Notes (Signed)
NAME:  [name], MRN:  [id number], DOB:  [birth year], LOS: 5 ADMISSION DATE:  06/21/2022, CONSULTATION DATE:  5/22 REFERRING MD:  Karene Fry, CHIEF COMPLAINT:  SOB   History of Present Illness:  67 year old man w/ hx of MG and recent admit for flare presenting to ER for worsening SOB.  Low NIF in ER with extreme tachypnea so intubated for airway protection.  No infectious prodrome, seems to have started just after discharged 2 days ago.  History per wife at bedside.  Neurology is following.  Pertinent  Medical History  MG DM2 Anxiety HTN Significant Hospital Events: Including procedures, antibiotic start and stop dates in addition to other pertinent events   5/22 admit 5/23 given Mg for replenishment, which exacerbated his MG symptoms. Extubation postponed. 5/24 pt extubate, reintubated. NIFs were -10 to -15 before reintubation.  Interim History / Subjective:  Started on low dose klonopin yesterday  Remains on minimal vent settings   Objective   Blood pressure 121/64, pulse (!) 58, temperature 98.3 F (36.8 C), temperature source Axillary, resp. rate (!) 22, height 5\' 5"  (1.651 m), weight 56 kg, SpO2 99 %.    Vent Mode: PRVC FiO2 (%):  [40 %] 40 % Set Rate:  [22 bmp] 22 bmp Vt Set:  [490 mL] 490 mL PEEP:  [5 cmH20] 5 cmH20 Pressure Support:  [10 cmH20] 10 cmH20 Plateau Pressure:  [17 cmH20-19 cmH20] 17 cmH20   Intake/Output Summary (Last 24 hours) at 06/26/2022 0751 Last data filed at 06/26/2022 0400 Gross per 24 hour  Intake 2317.91 ml  Output 2725 ml  Net -407.09 ml   Filed Weights   06/23/22 0709 06/25/22 0500 06/26/22 0453  Weight: 52.7 kg 56 kg 56 kg    Examination: Follows commands, can weakly lift head off bed, moves all ext equally ctab, equal chest rise, tachypneic on PS  Resolved Hospital Problem list   N/A Hyponatremia, possible pseudohyponatremia from IVIG  Assessment & Plan:  # Acute hypoxemic and hypercarbic respiratory failure due to recurrent  MG crisis with unclear trigger. Tachypneic on SBT today 10/5.   -full vent support -daily SAT/SBT, likely needs at least another day prior to extubation. It's hard to tell what extent his tachypnea is driven by anxiety vs diaphragmatic weakness. Declines extubation to BiPAP due to claustrophobia. If he looks ok on SBT today we will extubate to [facility] to try to help decrease WOB post extubation for him.  -continue tube feeds  -home MG medications  -continue second round IVIG per neuro -lasix IV 40 again  # DM2 without hyperglycemia -Sliding Scale insulin   Best Practice (right click and "Reselect all SmartList Selections" daily)   Diet/type: NPO - hold TF while on SBT today  DVT prophylaxis: prophylactic heparin  GI prophylaxis: PPI Lines: N/A Foley:  Yes, and it is still needed Code Status:  full code Last date of multidisciplinary goals of care discussion [wife updated at bedside]  Labs   CBC: Recent Labs  Lab 06/21/22 1605 06/21/22 1943 06/22/22 0426 06/22/22 0459 06/23/22 1354 06/23/22 1509  WBC 6.0  --   --  8.9  --   --   NEUTROABS 3.8  --   --   --   --   --   HGB 14.2 11.6* 11.2* 11.4* 13.6 12.6*  HCT 40.4 34.0* 33.0* 31.7* 40.0 37.0*  MCV 81.1  --   --  80.7  --   --   PLT 192  --   --  166  --   --     Basic Metabolic Panel: Recent Labs  Lab 06/22/22 0459 06/22/22 1246 06/23/22 0816 06/23/22 1354 06/23/22 1509 06/23/22 1808 06/24/22 0406 06/24/22 1727 06/25/22 0614 06/26/22 0442  NA 130*  --  133* 132* 132*  --  127*  --  129* 131*  K 3.0*  --  3.5 3.5 3.8  --  2.8*  --  3.4* 3.8  CL 100  --  99  --   --   --  99  --  103 99  CO2 20*  --  25  --   --   --  19*  --  18* 21*  GLUCOSE 162*  --  148*  --   --   --  204*  --  171* 154*  BUN 9  --  9  --   --   --  15  --  13 17  CREATININE 0.88  --  0.97  --   --   --  0.90  --  0.80 0.81  CALCIUM 8.1*  --  8.2*  --   --   --  7.9*  --  7.5* 8.0*  MG 1.5*   < > 2.0  --   --  2.0 2.1 2.1 2.0  --   PHOS  2.5   < > 3.1  --   --  2.8 2.0* 2.2* 2.2* 3.4   < > = values in this interval not displayed.   GFR: Estimated Creatinine Clearance: 72 mL/min (by C-G formula based on SCr of 0.81 mg/dL). Recent Labs  Lab 06/21/22 1605 06/22/22 0459  WBC 6.0 8.9    Liver Function Tests: No results for input(s): "AST", "ALT", "ALKPHOS", "BILITOT", "PROT", "ALBUMIN" in the last 168 hours.  No results for input(s): "LIPASE", "AMYLASE" in the last 168 hours. No results for input(s): "AMMONIA" in the last 168 hours.  ABG    Component Value Date/Time   PHART 7.336 (L) 06/23/2022 1509   PCO2ART 38.3 06/23/2022 1509   PO2ART 486 (H) 06/23/2022 1509   HCO3 20.6 06/23/2022 1509   TCO2 22 06/23/2022 1509   ACIDBASEDEF 5.0 (H) 06/23/2022 1509   O2SAT 100 06/23/2022 1509     Coagulation Profile: No results for input(s): "INR", "PROTIME" in the last 168 hours.  Cardiac Enzymes: No results for input(s): "CKTOTAL", "CKMB", "CKMBINDEX", "TROPONINI" in the last 168 hours.  HbA1C: Hemoglobin A1C  Date/Time Value Ref Range Status  02/03/2022 08:31 AM 5.9 (A) 4.0 - 5.6 % Final  05/02/2021 10:16 AM 5.9 (A) 4.0 - 5.6 % Final   Hgb A1c MFr Bld  Date/Time Value Ref Range Status  06/16/2022 07:22 AM 5.7 (H) 4.8 - 5.6 % Final    Comment:    (NOTE) Pre diabetes:          5.7%-6.4%  Diabetes:              >6.4%  Glycemic control for   <7.0% adults with diabetes   09/17/2017 09:47 PM 5.9 (H) 4.8 - 5.6 % Final    Comment:    (NOTE) Pre diabetes:          5.7%-6.4% Diabetes:              >6.4% Glycemic control for   <7.0% adults with diabetes     CBG: Recent Labs  Lab 06/25/22 1157 06/25/22 1544 06/25/22 2005 06/25/22 2341 06/26/22 0345  GLUCAP 210* 226* 164* 101* 169*  Review of Systems:   Intubated/sedated  Past Medical History:  He,  has a past medical history of Anxiety, Colon polyp, Depressive disorder, not elsewhere classified, External hemorrhoid, Insomnia, unspecified,  Internal hemorrhoids, Myasthenia gravis without exacerbation (HCC), Other and unspecified hyperlipidemia, 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   Surgical History:   Past Surgical History:  Procedure Laterality Date   THYMECTOMY       Social History:   reports that he has never smoked. He has never used smokeless tobacco. He reports that he does not drink alcohol and does not use drugs.   Family History:  His family history is not on file.   Allergies Allergies  Allergen Reactions   Azithromycin Other (See Comments)    REACTION: exac of his mg   Beta Adrenergic Blockers Other (See Comments)    REACTION: exac of mg     Home Medications  Prior to Admission medications   Medication Sig Start Date End Date Taking? Authorizing Provider  ampicillin (PRINCIPEN) 500 MG capsule Take by mouth. 08/07/20   [provider]  calcium carbonate (OS-CAL) 600 MG TABS Take 600 mg by mouth 3 (three) times daily with meals.    [provider]  Cholecalciferol (VITAMIN D) 2000 UNITS tablet Take 2,000 Units by mouth daily.    [provider]  colesevelam (WELCHOL) 625 MG tablet Take 3 tablets (1,875 mg total) by mouth daily. 05/24/22   Shamleffer, Konrad Dolores, MD  desonide (DESONATE) 0.05 % gel Apply 1 application topically 2 (two) times daily as needed.     [provider]  entecavir (BARACLUDE) 1 MG tablet Take 1 tablet (1 mg total) by mouth daily. 07/13/11   Brooke Dare, MD  fluocinonide ointment (LIDEX) 0.05 % APPLY TO AFFECTED AREA(S) OF THE SKIN daily AS NEEDED. By dermatologist. 10/04/21   [place], [name], MD  FLUoxetine (PROZAC) 20 MG capsule TAKE ONE CAPSULE BY MOUTH DAILY 01/25/22   [place], [name], MD  glucose blood (ONETOUCH ULTRA) test  strip USE TO TEST BLOOD SUGAR 3 TIMES DAILY 12/10/20   Romero Belling, MD  ibuprofen (ADVIL) 200 MG tablet Take 400 mg by mouth every 8 (eight) hours as needed for headache or mild pain.    [provider]  Lancets [facility] ULTRASOFT) lancets USE AS INSTRUCTED TO CHECK BLOOD SUGARS THREE TIMES A DAY 01/13/19   Romero Belling, MD  metFORMIN (GLUCOPHAGE) 500 MG tablet Take 2 tablets (1,000 mg total) by mouth 2 (two) times daily. 02/03/22   Shamleffer, Konrad Dolores, MD  olmesartan (BENICAR) 20 MG tablet TAKE ONE TABLET BY MOUTH DAILY *DISCONTINUE DILITIAZEM* 05/28/22   Shamleffer, Konrad Dolores, MD  omeprazole (PRILOSEC) 20 MG capsule TAKE 1 CAPSULE BY MOUTH DAILY 05/29/22   [place], [name], MD  pimecrolimus (ELIDEL) 1 % cream Apply 1 application topically 2 (two) times daily as needed.  01/01/13   [provider]  predniSONE (DELTASONE) 5 MG tablet Take 10 mg by mouth as directed.    [provider]  pyridostigmine (MESTINON) 60 MG tablet Take 60 mg by mouth 3 (three) times daily.    [provider]  repaglinide (PRANDIN) 0.5 MG tablet Take 1 tablet (0.5 mg total) by mouth daily before supper. Patient taking differently: Take 0.5 mg by mouth daily as needed (when BG >  200). 02/03/22   Shamleffer, Konrad Dolores, MD  sitaGLIPtin (JANUVIA) 100 MG tablet Take 1 tablet (100 mg total) by mouth daily. 02/03/22   Shamleffer, Konrad Dolores, MD  zolpidem (AMBIEN) 10 MG tablet TAKE 1/2 TO 1 TABLET (0.5-1) BY MOUTH EVERY NIGHT AT BEDTIME AS NEEDED 05/29/22   [place], [name], MD     Critical care time: 36 minutes    [facility] Pulmonary/Critical Care

## 2022-06-27 ENCOUNTER — Inpatient Hospital Stay (HOSPITAL_COMMUNITY): Payer: Medicare Other

## 2022-06-27 DIAGNOSIS — G7001 Myasthenia gravis with (acute) exacerbation: Secondary | ICD-10-CM | POA: Diagnosis not present

## 2022-06-27 LAB — BASIC METABOLIC PANEL
Anion gap: 14 (ref 5–15)
BUN: 26 mg/dL — ABNORMAL HIGH (ref 8–23)
CO2: 20 mmol/L — ABNORMAL LOW (ref 22–32)
Calcium: 8.6 mg/dL — ABNORMAL LOW (ref 8.9–10.3)
Chloride: 97 mmol/L — ABNORMAL LOW (ref 98–111)
Creatinine, Ser: 1.11 mg/dL (ref 0.61–1.24)
GFR, Estimated: >60 mL/min (ref >60)
Glucose, Bld: 191 mg/dL — ABNORMAL HIGH (ref 70–99)
Potassium: 4.5 mmol/L (ref 3.5–5.1)
Sodium: 131 mmol/L — ABNORMAL LOW (ref 135–145)

## 2022-06-27 LAB — GLUCOSE, CAPILLARY
Glucose-Capillary: 155 mg/dL — ABNORMAL HIGH (ref 70–99)
Glucose-Capillary: 160 mg/dL — ABNORMAL HIGH (ref 70–99)
Glucose-Capillary: 168 mg/dL — ABNORMAL HIGH (ref 70–99)
Glucose-Capillary: 195 mg/dL — ABNORMAL HIGH (ref 70–99)
Glucose-Capillary: 218 mg/dL — ABNORMAL HIGH (ref 70–99)
Glucose-Capillary: 232 mg/dL — ABNORMAL HIGH (ref 70–99)

## 2022-06-27 LAB — CULTURE, RESPIRATORY W GRAM STAIN

## 2022-06-27 LAB — TRIGLYCERIDES: Triglycerides: 109 mg/dL (ref <150)

## 2022-06-27 LAB — CULTURE, BLOOD (ROUTINE X 2): Culture: NO GROWTH

## 2022-06-27 MED ORDER — SODIUM CHLORIDE 0.9 % IV BOLUS
1000.0000 mL | Freq: Once | INTRAVENOUS | Status: AC
  Administered 2022-06-27: 1000 mL via INTRAVENOUS

## 2022-06-27 MED ORDER — ORAL CARE MOUTH RINSE
15.0000 mL | OROMUCOSAL | Status: DC
  Administered 2022-06-27 – 2022-06-29 (×25): 15 mL via OROMUCOSAL

## 2022-06-27 MED ORDER — SODIUM CHLORIDE 0.9 % IV BOLUS
500.0000 mL | Freq: Once | INTRAVENOUS | Status: AC
  Administered 2022-06-27: 500 mL via INTRAVENOUS

## 2022-06-27 MED ORDER — PYRIDOSTIGMINE BROMIDE 60 MG PO TABS
30.0000 mg | ORAL_TABLET | Freq: Three times a day (TID) | ORAL | Status: DC
  Administered 2022-06-27 – 2022-06-28 (×3): 30 mg
  Filled 2022-06-27 (×3): qty 1

## 2022-06-27 NOTE — Progress Notes (Signed)
US

## 2022-06-27 NOTE — Progress Notes (Signed)
eLink Physician-Brief Progress Note Patient Name: [name] DOB: 1955/07/10 MRN: [id number]   Date of Service  06/27/2022  HPI/Events of Note  Asked to evaluate patient for new onset right upper extremity weakness and flaccidity.  He has a history of myasthenia gravis and is receiving IVIG.  eICU Interventions  On camera exam, patient was writing a note with his right upper extremity.  He was lifting it off the bed.  Subsequently when his nurse came in, he was having difficulty lifting the arm.  I have paged neurology who will evaluate him.     Intervention Category Evaluation Type: Dallie Piles 06/27/2022, 8:33 PM

## 2022-06-27 NOTE — Inpatient Diabetes Management (Signed)
Inpatient Diabetes Program Recommendations  AACE/ADA: New Consensus Statement on Inpatient Glycemic Control (2015)  Target Ranges:  Prepandial:   less than 140 mg/dL      Peak postprandial:   less than 180 mg/dL (1-2 hours)      Critically ill patients:  140 - 180 mg/dL   Lab Results  Component Value Date   GLUCAP 218 (H) 06/27/2022   HGBA1C 5.7 (H) 06/16/2022    Review of Glycemic Control  Latest Reference Range & Units 06/26/22 07:55 06/26/22 11:26 06/26/22 15:31 06/26/22 19:54 06/26/22 23:47 06/27/22 03:47 06/27/22 07:44 06/27/22 11:31  Glucose-Capillary 70 - 99 mg/dL 454 (H) 098 (H) 119 (H) 109 (H) 234 (H) 232 (H) 195 (H) 218 (H)   Diabetes history: DM 2 Outpatient Diabetes medications: Metformin 1000 mg BID, Prandin 0.5 mg QD, Januvia 100 mg QD  Current orders for Inpatient glycemic control:  Novolog 0-20 units Q4 hours  Vital AF 55 ml/hour PO prednisone 10 mg Daily  Inpatient Diabetes Program Recommendations:    -   Consider starting Novolog 4 units Q4 Hours Tube Feed Coverage.   Thanks,  Christena Deem RN, MSN, BC-ADM Inpatient Diabetes Coordinator Team Pager [phone] (8a-5p)

## 2022-06-27 NOTE — Progress Notes (Signed)
Upon initial shift assessment, pt's right arm noticeably weaker than left.  Pt will lift right arm off of bed, but it is not resistant.  Pt also states he has noticed his arm weakness.  Elink noified.  Awaiting orders.

## 2022-06-27 NOTE — Progress Notes (Signed)
Patient is up in the chair at this time. CPT will be starting when returned back to bed.

## 2022-06-27 NOTE — Progress Notes (Signed)
Overnight on-call neurologist note  S:// I was called by the remote ICU Dr. Regarding concern for right upper extremity weakness with this patient.  According to the nurse, it was noted at the 8 PM exam that his right arm was weaker than the left but he was able to wait and operate his phone with the hand.  Family says that he is frustrated weakness is with his myasthenia flares. Have seen and evaluated the patient over the weekend and he does have a lot of effort dependent weakness in addition to his probable myasthenia gravis weakness.  O:// Awake alert oriented with endotracheal tube in place.  Appropriately nods yes and no.  Right eye ptosis.  Pupils equal round reactive light, no extract movement restriction, facial symmetry for opacity.  Right upper extremity has some drift on going antigravity, also has fatigable weakness against resistance.  Left upper extremity has minimal to no drift but has fatigable weakness.  Lower extremities appear symmetric 4/5 bilaterally.  Sensation intact.  No ataxia.  A:// Somewhat effort dependent weakness but has more focality with weakness to the right.  Will get stat MRI to rule out new stroke. MG exacerbation with psychogenic overlay  P:// Stat MRI If positive for stroke, will need stroke workup otherwise continue management as in Dr. Shelbie Hutching note from this morning for his MG exacerbation.  Plan was relayed to the bedside RN and preliminary plan discussed with the remote ICU attending.  ADDENDUM MRI brain completed and reviewed.  No evidence of acute stroke. Continue management as you are.  Plan relayed to Dr. Cloyd Stagers  -- Milon Dikes, MD Neurologist [facility] Pager: [phone]  CRITICAL CARE ATTESTATION Performed by: Milon Dikes, MD Total critical care time: 25 minutes-urgent evaluation for concern for stroke Critical care time was exclusive of separately billable procedures and treating other patients and/or supervising  APPs/Residents/Students Critical care was necessary to treat or prevent imminent or life-threatening deterioration. This patient is critically ill and at significant risk for neurological worsening and/or death and care requires constant monitoring. Critical care was time spent personally by me on the following activities: development of treatment plan with patient and/or surrogate as well as nursing, discussions with consultants, evaluation of patient's response to treatment, examination of patient, obtaining history from patient or surrogate, ordering and performing treatments and interventions, ordering and review of laboratory studies, ordering and review of radiographic studies, pulse oximetry, re-evaluation of patient's condition, participation in multidisciplinary rounds and medical decision making of high complexity in the care of this patient.

## 2022-06-27 NOTE — Progress Notes (Signed)
Transported patient to MRI while patient was on the ventilator. Patient remained stable during transport. 

## 2022-06-27 NOTE — Progress Notes (Signed)
NAME:  [name], MRN:  [id number], DOB:  [birth year], LOS: 6 ADMISSION DATE:  06/21/2022, CONSULTATION DATE:  5/22 REFERRING MD:  Karene Fry, CHIEF COMPLAINT:  SOB   History of Present Illness:  67 year old man w/ hx of MG and recent admit for flare presenting to ER for worsening SOB.  Low NIF in ER with extreme tachypnea so intubated for airway protection.  No infectious prodrome, seems to have started just after discharged 2 days ago.  History per wife at bedside.  Neurology is following.  Pertinent  Medical History  MG DM2 Anxiety HTN Significant Hospital Events: Including procedures, antibiotic start and stop dates in addition to other pertinent events   5/22 admit 5/23 given Mg for replenishment, which exacerbated his MG symptoms. Extubation postponed. 5/24 pt extubate, reintubated. NIFs were -10 to -15 before reintubation. 5/27 extubated and reintubated 9h later due to ineffective cough.   Interim History / Subjective:  Re-intubated yesterday. Still hypotensive, on NE. No oral intake yesterday.    Objective   Blood pressure 109/66, pulse 83, temperature 99.6 F (37.6 C), temperature source Axillary, resp. rate (!) 22, height 5\' 5"  (1.651 m), weight 56 kg, SpO2 99 %.    Vent Mode: PRVC FiO2 (%):  [40 %-80 %] 40 % Set Rate:  [20 bmp-22 bmp] 22 bmp Vt Set:  [490 mL] 490 mL PEEP:  [5 cmH20-8 cmH20] 5 cmH20 Plateau Pressure:  [15 cmH20-24 cmH20] 17 cmH20   Intake/Output Summary (Last 24 hours) at 06/27/2022 0906 Last data filed at 06/27/2022 0800 Gross per 24 hour  Intake 3273.91 ml  Output 1150 ml  Net 2123.91 ml    Filed Weights   06/23/22 0709 06/25/22 0500 06/26/22 0453  Weight: 52.7 kg 56 kg 56 kg    Examination: General - Slender build HEENT - orally intubated, nasogastric tube in place. Resp - diffuse rhonchi.  Abdo - soft Neuro - awake and able to follow commands and communicate by writing, strength 4+/5. Eyes remain dysconjugate.   Ancillary tests  personally reviewed:   Na: 131  Assessment & Plan:  # Acute hypoxemic and hypercarbic respiratory failure due to recurrent MG crisis with unclear trigger.  Now required 2nd reintubation.  Patient has refused tracheostomy and PICC placement.  Anxiety/PTSD component as well.  -continue tube feeds  -home MG medications  -continue second round IVIG per neuro. Will discuss with them what are next steps.  Patient is asking about PLEX -continue clonazepam for anxiety - complete 5 days of antibiotics for aspiration.  - Low dose Precedex for comfort.   # DM2 without hyperglycemia -Sliding Scale insulin   Best Practice (right click and "Reselect all SmartList Selections" daily)   Diet/type: NPO - resume tube feeds DVT prophylaxis: prophylactic heparin  GI prophylaxis: PPI Lines: N/A Foley:  Yes, and it is still needed Code Status:  full code Last date of multidisciplinary goals of care discussion [wife updated at bedside]  CRITICAL CARE Performed by: Lynnell Catalan   Total critical care time: 35 minutes  Critical care time was exclusive of separately billable procedures and treating other patients.  Critical care was necessary to treat or prevent imminent or life-threatening deterioration.  Critical care was time spent personally by me on the following activities: development of treatment plan with patient and/or surrogate as well as nursing, discussions with consultants, evaluation of patient's response to treatment, examination of patient, obtaining history from patient or surrogate, ordering and performing treatments and interventions, ordering and review of  laboratory studies, ordering and review of radiographic studies, pulse oximetry, re-evaluation of patient's condition and participation in multidisciplinary rounds.  Lynnell Catalan, MD [facility] ICU Physician [facility]  Pager: [phone] Mobile: [phone] After hours: [phone].

## 2022-06-27 NOTE — Progress Notes (Addendum)
Subjective: Reintubated for air hunger overnight.   Objective: Current vital signs: BP 133/79   Pulse 79   Temp 98.7 F (37.1 C) (Oral)   Resp (!) 22   Ht 5\' 5"  (1.651 m)   Wt 56 kg   SpO2 100%   BMI 20.54 kg/m  Vital signs in last 24 hours: Temp:  [98.7 F (37.1 C)-99.8 F (37.7 C)] 98.7 F (37.1 C) (05/28 1159) Pulse Rate:  [66-171] 79 (05/28 1200) Resp:  [16-39] 22 (05/28 1200) BP: (67-242)/(48-112) 133/79 (05/28 1200) SpO2:  [92 %-100 %] 100 % (05/28 1200) FiO2 (%):  [40 %-80 %] 40 % (05/28 1105)  Intake/Output from previous day: 05/27 0701 - 05/28 0700 In: 3399.7 [I.V.:865.2; NG/GT:1430; IV Piggyback:1104.5] Out: 1300 [Urine:1300] Intake/Output this shift: Total I/O In: 459 [I.V.:117; NG/GT:220; IV Piggyback:122] Out: -  Nutritional status:  Diet Order             Diet NPO time specified  Diet effective now                  HEENT: [place]/AT Lungs: Intubated   Neurologic Exam: Ment: Awake and alert. Follows all commands. Unable to assess speech output due to intubation.   CN: PERRL. Left eye with full EOM. Right eye with incomplete adduction and lags left eye with EOM. Right ptosis is present. Unchanged from yesterday.  Motor: 4/5 BUE proximally and distally 4/5 BLE proximally and distally Normal muscle bulk and tone.  Sensory: Intact to FT x 4 Cerebellar: No ataxia with FNF bilaterally  Lab Results: Results for orders placed or performed during the hospital encounter of 06/21/22 (from the past 48 hour(s))  Glucose, capillary     Status: Abnormal   Collection Time: 06/25/22  3:44 PM  Result Value Ref Range   Glucose-Capillary 226 (H) 70 - 99 mg/dL    Comment: Glucose reference range applies only to samples taken after fasting for at least 8 hours.  Glucose, capillary     Status: Abnormal   Collection Time: 06/25/22  8:05 PM  Result Value Ref Range   Glucose-Capillary 164 (H) 70 - 99 mg/dL    Comment: Glucose reference range applies only to samples  taken after fasting for at least 8 hours.  Glucose, capillary     Status: Abnormal   Collection Time: 06/25/22 11:41 PM  Result Value Ref Range   Glucose-Capillary 101 (H) 70 - 99 mg/dL    Comment: Glucose reference range applies only to samples taken after fasting for at least 8 hours.  Glucose, capillary     Status: Abnormal   Collection Time: 06/26/22  3:45 AM  Result Value Ref Range   Glucose-Capillary 169 (H) 70 - 99 mg/dL    Comment: Glucose reference range applies only to samples taken after fasting for at least 8 hours.  Basic metabolic panel     Status: Abnormal   Collection Time: 06/26/22  4:42 AM  Result Value Ref Range   Sodium 131 (L) 135 - 145 mmol/L   Potassium 3.8 3.5 - 5.1 mmol/L   Chloride 99 98 - 111 mmol/L   CO2 21 (L) 22 - 32 mmol/L   Glucose, Bld 154 (H) 70 - 99 mg/dL    Comment: Glucose reference range applies only to samples taken after fasting for at least 8 hours.   BUN 17 8 - 23 mg/dL   Creatinine, Ser 1.61 0.61 - 1.24 mg/dL   Calcium 8.0 (L) 8.9 - 10.3 mg/dL  GFR, Estimated >60 >60 mL/min    Comment: (NOTE) Calculated using the CKD-EPI Creatinine Equation (2021)    Anion gap 11 5 - 15    Comment: Performed at [facility], [address] [place], [place] [address]  Phosphorus     Status: None   Collection Time: 06/26/22  4:42 AM  Result Value Ref Range   Phosphorus 3.4 2.5 - 4.6 mg/dL    Comment: Performed at [facility], [address], [place], [place] [address]  Glucose, capillary     Status: Abnormal   Collection Time: 06/26/22  7:55 AM  Result Value Ref Range   Glucose-Capillary 180 (H) 70 - 99 mg/dL    Comment: Glucose reference range applies only to samples taken after fasting for at least 8 hours.  Glucose, capillary     Status: Abnormal   Collection Time: 06/26/22 11:26 AM  Result Value Ref Range   Glucose-Capillary 149 (H) 70 - 99 mg/dL    Comment: Glucose reference range applies only to samples taken after fasting for at  least 8 hours.  Glucose, capillary     Status: Abnormal   Collection Time: 06/26/22  3:31 PM  Result Value Ref Range   Glucose-Capillary 209 (H) 70 - 99 mg/dL    Comment: Glucose reference range applies only to samples taken after fasting for at least 8 hours.  Glucose, capillary     Status: Abnormal   Collection Time: 06/26/22  7:54 PM  Result Value Ref Range   Glucose-Capillary 109 (H) 70 - 99 mg/dL    Comment: Glucose reference range applies only to samples taken after fasting for at least 8 hours.  Blood gas, arterial     Status: Abnormal   Collection Time: 06/26/22  9:42 PM  Result Value Ref Range   pH, Arterial 7.3 (L) 7.35 - 7.45   pCO2 arterial 54 (H) 32 - 48 mmHg   pO2, Arterial 64 (L) 83 - 108 mmHg   Bicarbonate 26.1 20.0 - 28.0 mmol/L   Acid-base deficit 1.1 0.0 - 2.0 mmol/L   O2 Saturation 91.6 %   Patient temperature 37.3    Collection site RIGHT RADIAL    Allens test (pass/fail) PASS PASS    Comment: Performed at [facility], [address], [place], [place] [address]  Culture, Respiratory w Gram Stain     Status: None (Preliminary result)   Collection Time: 06/26/22 10:22 PM   Specimen: Tracheal Aspirate; Respiratory  Result Value Ref Range   Specimen Description TRACHEAL ASPIRATE    Special Requests NONE    Gram Stain      FEW WBC PRESENT,BOTH PMN AND MONONUCLEAR FEW GRAM POSITIVE COCCI IN PAIRS Performed at [facility], [address], [place], [place] [address]    Culture PENDING    Report Status PENDING   Glucose, capillary     Status: Abnormal   Collection Time: 06/26/22 11:47 PM  Result Value Ref Range   Glucose-Capillary 234 (H) 70 - 99 mg/dL    Comment: Glucose reference range applies only to samples taken after fasting for at least 8 hours.  Glucose, capillary     Status: Abnormal   Collection Time: 06/27/22  3:47 AM  Result Value Ref Range   Glucose-Capillary 232 (H) 70 - 99 mg/dL    Comment: Glucose reference range applies only to  samples taken after fasting for at least 8 hours.  Basic metabolic panel  Status: Abnormal   Collection Time: 06/27/22  6:02 AM  Result Value Ref Range   Sodium 131 (L) 135 - 145 mmol/L   Potassium 4.5 3.5 - 5.1 mmol/L   Chloride 97 (L) 98 - 111 mmol/L   CO2 20 (L) 22 - 32 mmol/L   Glucose, Bld 191 (H) 70 - 99 mg/dL    Comment: Glucose reference range applies only to samples taken after fasting for at least 8 hours.   BUN 26 (H) 8 - 23 mg/dL   Creatinine, Ser 4.09 0.61 - 1.24 mg/dL   Calcium 8.6 (L) 8.9 - 10.3 mg/dL   GFR, Estimated >81 >19 mL/min    Comment: (NOTE) Calculated using the CKD-EPI Creatinine Equation (2021)    Anion gap 14 5 - 15    Comment: Performed at [facility], [address], [place], [place] [address]  Triglycerides     Status: None   Collection Time: 06/27/22  6:02 AM  Result Value Ref Range   Triglycerides 109 <150 mg/dL    Comment: Performed at [facility], [address], [place], [place] [address]  Glucose, capillary     Status: Abnormal   Collection Time: 06/27/22  7:44 AM  Result Value Ref Range   Glucose-Capillary 195 (H) 70 - 99 mg/dL    Comment: Glucose reference range applies only to samples taken after fasting for at least 8 hours.  Glucose, capillary     Status: Abnormal   Collection Time: 06/27/22 11:31 AM  Result Value Ref Range   Glucose-Capillary 218 (H) 70 - 99 mg/dL    Comment: Glucose reference range applies only to samples taken after fasting for at least 8 hours.    Recent Results (from the past 240 hour(s))  SARS Coronavirus 2 by RT PCR (hospital order, performed in [facility] hospital lab) *cepheid single result test* Anterior Nasal Swab     Status: None   Collection Time: 06/21/22  5:41 PM   Specimen: Anterior Nasal Swab  Result Value Ref Range Status   SARS Coronavirus 2 by RT PCR NEGATIVE NEGATIVE Final    Comment: Performed at [facility], [address] [place], [place] [address]  MRSA Next Gen by  PCR, Nasal     Status: None   Collection Time: 06/21/22  8:26 PM   Specimen: Nasal Mucosa; Nasal Swab  Result Value Ref Range Status   MRSA by PCR Next Gen NOT DETECTED NOT DETECTED Final    Comment: (NOTE) The GeneXpert MRSA Assay (FDA approved for NASAL specimens only), is one component of a comprehensive MRSA colonization surveillance program. It is not intended to diagnose MRSA infection nor to guide or monitor treatment for MRSA infections. Test performance is not FDA approved in patients less than 74 years old. Performed at [facility], [address] [place], [place] [address]   Culture, blood (Routine X 2) w Reflex to ID Panel     Status: None (Preliminary result)   Collection Time: 06/24/22  4:06 AM   Specimen: BLOOD LEFT ARM  Result Value Ref Range Status   Specimen Description BLOOD LEFT ARM  Final   Special Requests   Final    BOTTLES DRAWN AEROBIC AND ANAEROBIC Blood Culture results may not be optimal due to an excessive volume of blood received in culture bottles   Culture   Final    NO GROWTH 3 DAYS Performed at [facility], [address],  [place], [place] [address]    Report Status PENDING  Incomplete  Culture, blood (Routine X 2) w Reflex to ID Panel     Status: None (Preliminary result)   Collection Time: 06/24/22  4:06 AM   Specimen: BLOOD LEFT ARM  Result Value Ref Range Status   Specimen Description BLOOD LEFT ARM  Final   Special Requests   Final    BOTTLES DRAWN AEROBIC AND ANAEROBIC Blood Culture results may not be optimal due to an excessive volume of blood received in culture bottles   Culture   Final    NO GROWTH 3 DAYS Performed at [facility], [address] [place] [address] [place], [place] [address]    Report Status PENDING  Incomplete  Culture, Respiratory w Gram Stain     Status: None (Preliminary result)   Collection Time: 06/24/22  4:31 AM   Specimen: Tracheal Aspirate; Respiratory  Result Value Ref Range Status   Specimen  Description TRACHEAL ASPIRATE  Final   Special Requests NONE  Final   Gram Stain   Final    ABUNDANT WBC PRESENT,BOTH PMN AND MONONUCLEAR RARE GRAM POSITIVE COCCI IN PAIRS    Culture   Final    FEW STAPHYLOCOCCUS AUREUS SUSCEPTIBILITIES TO FOLLOW Performed at [facility], [address], [place], [place] [address]    Report Status PENDING  Incomplete  Respiratory (~20 pathogens) panel by PCR     Status: None   Collection Time: 06/24/22 11:06 AM   Specimen: Nasopharyngeal Swab; Respiratory  Result Value Ref Range Status   Adenovirus NOT DETECTED NOT DETECTED Final   Coronavirus 229E NOT DETECTED NOT DETECTED Final    Comment: (NOTE) The Coronavirus on the Respiratory Panel, DOES NOT test for the novel  Coronavirus (2019 nCoV)    Coronavirus HKU1 NOT DETECTED NOT DETECTED Final   Coronavirus NL63 NOT DETECTED NOT DETECTED Final   Coronavirus OC43 NOT DETECTED NOT DETECTED Final   Metapneumovirus NOT DETECTED NOT DETECTED Final   Rhinovirus / Enterovirus NOT DETECTED NOT DETECTED Final   Influenza A NOT DETECTED NOT DETECTED Final   Influenza B NOT DETECTED NOT DETECTED Final   Parainfluenza Virus 1 NOT DETECTED NOT DETECTED Final   Parainfluenza Virus 2 NOT DETECTED NOT DETECTED Final   Parainfluenza Virus 3 NOT DETECTED NOT DETECTED Final   Parainfluenza Virus 4 NOT DETECTED NOT DETECTED Final   Respiratory Syncytial Virus NOT DETECTED NOT DETECTED Final   Bordetella pertussis NOT DETECTED NOT DETECTED Final   Bordetella Parapertussis NOT DETECTED NOT DETECTED Final   Chlamydophila pneumoniae NOT DETECTED NOT DETECTED Final   Mycoplasma pneumoniae NOT DETECTED NOT DETECTED Final    Comment: Performed at [facility], [address], [place], [place] [address]  Culture, Respiratory w Gram Stain     Status: None (Preliminary result)   Collection Time: 06/26/22 10:22 PM   Specimen: Tracheal Aspirate; Respiratory  Result Value Ref Range Status   Specimen Description  TRACHEAL ASPIRATE  Final   Special Requests NONE  Final   Gram Stain   Final    FEW WBC PRESENT,BOTH PMN AND MONONUCLEAR FEW GRAM POSITIVE COCCI IN PAIRS Performed at [facility], [address], [place], [place] [address]    Culture PENDING  Incomplete   Report Status PENDING  Incomplete    Lipid Panel Recent Labs    06/27/22 0602  TRIG 109    Studies/Results: Portable Chest x-ray  Result Date: 06/26/2022 CLINICAL DATA:  ET  tube EXAM: PORTABLE CHEST 1 VIEW COMPARISON:  06/24/2022 FINDINGS: Endotracheal tube and feeding tube are in stable position. Increasing right infrahilar airspace opacity. Left lung clear. No effusions. Heart and mediastinal contours within normal limits. Prior CABG. IMPRESSION: Support devices stable. Increasing right infrahilar atelectasis or infiltrate. Electronically Signed   By: Charlett Nose M.D.   On: 06/26/2022 21:11    Medications: Scheduled:  Chlorhexidine Gluconate Cloth  6 each Topical Daily   clonazepam  0.25 mg Per Tube BID   docusate  100 mg Per Tube BID   famotidine  20 mg Per Tube BID   heparin  5,000 Units Subcutaneous Q8H   insulin aspart  0-20 Units Subcutaneous Q4H   mouth rinse  15 mL Mouth Rinse Q2H   polyethylene glycol  17 g Per Tube Daily   predniSONE  10 mg Per Tube Q breakfast   pyridostigmine  60 mg Per Tube Q8H   Continuous:  sodium chloride 999 mL/hr at 06/26/22 2259   ampicillin-sulbactam (UNASYN) IV 200 mL/hr at 06/27/22 1200   dexmedetomidine (PRECEDEX) IV infusion 0.6 mcg/kg/hr (06/27/22 1200)   feeding supplement (VITAL AF 1.2 CAL) 55 mL/hr at 06/27/22 1200   Immune Globulin 10% Stopped (06/26/22 2114)   norepinephrine (LEVOPHED) Adult infusion 2 mcg/min (06/27/22 1217)   Assessment: 66/M, hx seropositive myasthenia gravis recently admitted for exacerbation. He improved on IVIG and was discharged to home on 5/20, at which point his respiratory function was vastly improved although he did have some residual  ptosis. He then re-presented on 5/22, 3 days after discharge, with respiratory distress and NIF -19. He was reintubated in the ED due to concerns for increasing hypercarbia on his blood gases. He was started on a repeat course of IVIG after weighing IVIG versus PLEX. Extubation has been a challenge-unclear if this is more related to anxiety or diaphragmatic weakness/respiratory muscle weakness.  - Reintubated overnight for air hunger. Exam today reveals 4/5 strength x 4. Awake and alert.  - Both his inpatient and outpatient neurologists have raised the concerns that some of his symptoms and exacerbations may be more somatoform in etiology 2/2 his severe anxiety 2/2 PTSD may be playing a role.   Impression:  MG exacerbation with possible component to his presentation secondary to somatoform disorder   Recommendations: - Continue IVIG 400 mg/kg/day. Today is day 5/5.  - Continue home prednisone - Decreasing Mestinon to 30 mg tid given excessive secretions, which may have played a role in his air hunger requiring reintubation overnight.  - On Klonopin 0.25 mg BID for anxiety - After he is extubated, will need speech therapy for input on degree of pharyngeal weakness/swallowing function to determine if there is objective weakness of swallowing and clearing of secretions that may be leading to his recurrent bouts of respiratory distress.  - Daily FVC and NIFs with respiratory therapy after extubation. Please record results in progress notes.   - Medications that may worsen or trigger MG exacerbation: Class IA antiarrhythmics, magnesium, flouroquinolones, macrolides, aminoglycosides, penicillamine, curare, interferon alpha, botox, quinine. Use with caution: calcium channel blockers, beta blockers and statins.  35 minutes spent in the neurological evaluation and management of this critically ill patient.    LOS: 6 days   @Electronically  signed: Dr. Caryl Pina 06/27/2022  12:17 PM

## 2022-06-27 NOTE — Progress Notes (Signed)
OT Treatment Note:  Clinical Impression: Pt progressing towards OT goals this session. Following commands appropriately and communicating by writing and thumbs up/down and head shakes. Pt mod A to perform oral suction (in mouth around vent) for safety but able to bring hands to face without problem. Min A +2 safety for line management for transfer from bed to chair today. Pt nodding feeling better in chair and very comfortable. At this time changed dc recommendation to post-acute rehab of >3 hours daily. Daughter present throughout session and very supportive. Wife present at beginning and end of session and very supportive.     06/27/22 1118  OT Visit Information  Last OT Received On 06/27/22  Assistance Needed +2  PT/OT/SLP Co-Evaluation/Treatment Yes  Reason for Co-Treatment Complexity of the patient's impairments (multi-system involvement);Necessary to address cognition/behavior during functional activity;For patient/therapist safety;To address functional/ADL transfers  PT goals addressed during session Mobility/safety with mobility;Balance  OT goals addressed during session ADL's and self-care;Strengthening/ROM  History of Present Illness Pt is a 67 y.o. M who presents 06/16/2022 with complaints of myasthenia gravis flare; over the past 4 weeks he has been having worsening ptosis in the right eye, right face and arm weakness, difficulty swallowing, difficulty breathing. extubated and re-intubated 06/26/22 Significant PMH: myasthenia gravis, hepatitis B, T2DM, HTN.  Precautions  Precautions Fall  Precaution Comments on vent this session, coretrak  Restrictions  Weight Bearing Restrictions No  Pain Assessment  Pain Assessment CPOT  Facial Expression 0  Body Movements 0  Muscle Tension 0  Compliance with ventilator (intubated pts.) N/A  Vocalization (extubated pts.) N/A  CPOT Total 0  Pain Intervention(s) Monitored during session;Repositioned (Pt shaking head indicating no pain)   Cognition  Arousal/Alertness Awake/alert  Behavior During Therapy WFL for tasks assessed/performed  Overall Cognitive Status Difficult to assess  General Comments Pt writing down phrases in englush indicating breathing and wanting to cough, following directions throughout, cooperative  Difficult to assess due to Intubated  Upper Extremity Assessment  Upper Extremity Assessment Generalized weakness  RUE Deficits / Details very close to full strength  LUE Deficits / Details very close to full strength  Lower Extremity Assessment  Lower Extremity Assessment Defer to PT evaluation  Vision- Assessment  Additional Comments R eye ptosis, not fully closed  ADL  Overall ADL's  Needs assistance/impaired  Eating/Feeding NPO  Upper Body Dressing  Minimal assistance;Sitting  Upper Body Dressing Details (indicate cue type and reason) new gown  Toilet Transfer +2 for safety/equipment;Stand-pivot;BSC/3in1;Minimal assistance  Toilet Transfer Details (indicate cue type and reason) min of one person for face to face transfer to recliner from EOB, one person to manage vent tubing and additional lines  Toileting - Clothing Manipulation Details (indicate cue type and reason) Pt with condom cath  Functional mobility during ADLs Minimal assistance;+2 for safety/equipment  Bed Mobility  Overal bed mobility Needs Assistance  Bed Mobility Supine to Sit;Sit to Supine  Supine to sit +2 for physical assistance;+2 for safety/equipment;Mod assist  General bed mobility comments assist for lines with vent tubing, coretrack, IV, condom cath, mod A for trunk elevation  Transfers  Overall transfer level Needs assistance  Equipment used 1 person hand held assist (2nd person assisting with vent tubing and additional lines)  Transfers Sit to/from Stand;Bed to chair/wheelchair/BSC  Sit to Stand Min assist;+2 safety/equipment  Bed to/from chair/wheelchair/BSC transfer type: Step pivot  Step pivot transfers Min assist;+2  safety/equipment  General transfer comment stood at bedside with min assist for slight boost and balance +  2 for multiple line management  Balance  Overall balance assessment Needs assistance  Sitting-balance support Feet supported  Sitting balance-Leahy Scale Poor  Sitting balance - Comments decreased balance from previous session, requiring min to mod A for seated balance even with both feet on the floor  Standing balance support Bilateral upper extremity supported  Standing balance-Leahy Scale Poor  Standing balance comment supported in standing for safety with multiple lines, etc  OT - End of Session  Equipment Utilized During Treatment Gait belt;Oxygen (vent support)  Activity Tolerance Patient tolerated treatment well  Patient left with call bell/phone within reach;in chair;with chair alarm set;with family/visitor present  Nurse Communication Mobility status  OT Assessment/Plan  OT Plan Discharge plan needs to be updated  OT Visit Diagnosis Unsteadiness on feet (R26.81);Other abnormalities of gait and mobility (R26.89);Muscle weakness (generalized) (M62.81)  OT Frequency (ACUTE ONLY) Min 2X/week  Follow Up Recommendations Acute inpatient rehab (3hours/day)  Assistance recommended at discharge Frequent or constant Supervision/Assistance (initally)  Patient can return home with the following A little help with walking and/or transfers;A little help with bathing/dressing/bathroom;Assistance with cooking/housework;Direct supervision/assist for medications management;Direct supervision/assist for financial management;Assist for transportation;Help with stairs or ramp for entrance  OT Equipment  (will continue to assess)  AM-PAC OT "6 Clicks" Daily Activity Outcome Measure (Version 2)  Help from another person eating meals? 1 (NPO)  Help from another person taking care of personal grooming? 3  Help from another person toileting, which includes using toliet, bedpan, or urinal? 2  Help  from another person bathing (including washing, rinsing, drying)? 2  Help from another person to put on and taking off regular upper body clothing? 3  Help from another person to put on and taking off regular lower body clothing? 2  6 Click Score 13  Progressive Mobility  What is the highest level of mobility based on the progressive mobility assessment? Level 3 (Stands with assist) - Balance while standing  and cannot march in place  Mobility Referral No  Activity Transferred from bed to chair  OT Goal Progression  Progress towards OT goals Progressing toward goals  Acute Rehab OT Goals  Patient Stated Goal did not state  OT Goal Formulation Patient unable to participate in goal setting  Time For Goal Achievement 07/10/22  Potential to Achieve Goals Good  OT Time Calculation  OT Start Time (ACUTE ONLY) 1008  OT Stop Time (ACUTE ONLY) 1034  OT Time Calculation (min) 26 min  OT General Charges  $OT Visit 1 Visit  OT Treatments  $Self Care/Home Management  8-22 mins   Nyoka Cowden OTR/L Acute Rehabilitation Services Office: [phone]

## 2022-06-27 NOTE — Progress Notes (Addendum)
eLink Physician-Brief Progress Note Patient Name: [name] DOB: 1955/12/08 MRN: [id number]   Date of Service  06/27/2022  HPI/Events of Note  HR improved with rate in the 140s.  EKG shows sinus tachycardia.   BP 90/54.  eICU Interventions  Titrate propofol down. Give another 500cc NS bolus.      Intervention Category Intermediate Interventions: Arrhythmia - evaluation and management  [name] 06/27/2022, 1:01 AM  1:26 AM Pt is waking up with low dose of propofol.   Plan> Transition to precedex and give fentanyl IV bolus.   5:24 AM On camera reassessment, BP 112/66, HR 108, RR 22, O2 sats 100%.

## 2022-06-27 NOTE — Progress Notes (Signed)
Physical Therapy Treatment Patient Details Name: [name] MRN: [id number] DOB: 1955/03/12 Today's Date: 06/27/2022   History of Present Illness Pt is a 67 y.o. M who presents 06/16/2022 with complaints of myasthenia gravis flare; over the past 4 weeks he has been having worsening ptosis in the right eye, right face and arm weakness, difficulty swallowing, difficulty breathing. extubated and re-intubated 06/26/22 Significant PMH: myasthenia gravis, hepatitis B, T2DM, HTN.    PT Comments    Patient with limited progress today.  Actually needing more help for sitting balance, but able to step to chair with min A +another for lines.  Patient may be slow to recover after reintubation so may benefit from intensive inpatient rehab prior to d/c home.  PT will continue to follow.   Recommendations for follow up therapy are one component of a multi-disciplinary discharge planning process, led by the attending physician.  Recommendations may be updated based on patient status, additional functional criteria and insurance authorization.  Follow Up Recommendations       Assistance Recommended at Discharge Intermittent Supervision/Assistance  Patient can return home with the following A little help with walking and/or transfers;Help with stairs or ramp for entrance;A little help with bathing/dressing/bathroom   Equipment Recommendations  Other (comment) (TBA pending progress)    Recommendations for Other Services Rehab consult     Precautions / Restrictions Precautions Precautions: Fall Precaution Comments: on vent this session, coretrak Restrictions Weight Bearing Restrictions: No     Mobility  Bed Mobility Overal bed mobility: Needs Assistance Bed Mobility: Supine to Sit, Sit to Supine     Supine to sit: +2 for safety/equipment, Mod assist     General bed mobility comments: assist for lines with vent tubing, coretrack, IV, condom cath, mod A for trunk elevation     Transfers Overall transfer level: Needs assistance Equipment used: 1 person hand held assist Transfers: Sit to/from Stand, Bed to chair/wheelchair/BSC Sit to Stand: Min assist, +2 safety/equipment   Step pivot transfers: Min assist, +2 safety/equipment       General transfer comment: stood at bedside with min assist for slight boost and balance +2 for multiple line management    Ambulation/Gait                   Stairs             Wheelchair Mobility    Modified Rankin (Stroke Patients Only)       Balance Overall balance assessment: Needs assistance Sitting-balance support: Feet supported Sitting balance-Leahy Scale: Poor Sitting balance - Comments: requiring min to mod A for seated balance even with both feet on the floor   Standing balance support: Bilateral upper extremity supported Standing balance-Leahy Scale: Poor Standing balance comment: supported in standing for safety with multiple lines, etc                            Cognition Arousal/Alertness: Awake/alert Behavior During Therapy: WFL for tasks assessed/performed Overall Cognitive Status: Difficult to assess                                 General Comments: Pt writing down phrases in englush indicating breathing and wanting to cough, following directions throughout, cooperative        Exercises      General Comments General comments (skin integrity, edema, etc.): on PRVC mode 40% FiO2 PEEP of  5; VSS throughout, wife and daughter present      Pertinent Vitals/Pain Pain Assessment Facial Expression: Relaxed, neutral Body Movements: Absence of movements Muscle Tension: Relaxed Compliance with ventilator (intubated pts.): Tolerating ventilator or movement Vocalization (extubated pts.): N/A CPOT Total: 0 Pain Intervention(s): Monitored during session    Home Living                          Prior Function            PT Goals (current  goals can now be found in the care plan section) Progress towards PT goals: Not progressing toward goals - comment    Frequency    Min 3X/week      PT Plan Current plan remains appropriate;Discharge plan needs to be updated    Co-evaluation   Reason for Co-Treatment: Complexity of the patient's impairments (multi-system involvement);Necessary to address cognition/behavior during functional activity;For patient/therapist safety;To address functional/ADL transfers PT goals addressed during session: Mobility/safety with mobility;Balance OT goals addressed during session: ADL's and self-care;Strengthening/ROM      AM-PAC PT "6 Clicks" Mobility   Outcome Measure  Help needed turning from your back to your side while in a flat bed without using bedrails?: A Lot Help needed moving from lying on your back to sitting on the side of a flat bed without using bedrails?: A Lot Help needed moving to and from a bed to a chair (including a wheelchair)?: A Lot Help needed standing up from a chair using your arms (e.g., wheelchair or bedside chair)?: A Lot Help needed to walk in hospital room?: Total Help needed climbing 3-5 steps with a railing? : Total 6 Click Score: 10    End of Session Equipment Utilized During Treatment: Gait belt;Other (comment) (on vent) Activity Tolerance: Patient tolerated treatment well Patient left: in chair;with call bell/phone within reach;with family/visitor present   PT Visit Diagnosis: Muscle weakness (generalized) (M62.81);Other symptoms and signs involving the nervous system (R29.898)     Time: 1610-9604 PT Time Calculation (min) (ACUTE ONLY): 26 min  Charges:  $Therapeutic Activity: 8-22 mins                     Sheran Lawless, PT Acute Rehabilitation Services Office:[phone] 06/27/2022    [name] 06/27/2022, 1:36 PM

## 2022-06-28 DIAGNOSIS — J9600 Acute respiratory failure, unspecified whether with hypoxia or hypercapnia: Secondary | ICD-10-CM | POA: Diagnosis not present

## 2022-06-28 DIAGNOSIS — G7001 Myasthenia gravis with (acute) exacerbation: Secondary | ICD-10-CM | POA: Diagnosis not present

## 2022-06-28 LAB — CULTURE, RESPIRATORY W GRAM STAIN

## 2022-06-28 LAB — BASIC METABOLIC PANEL
Anion gap: 10 (ref 5–15)
BUN: 28 mg/dL — ABNORMAL HIGH (ref 8–23)
CO2: 20 mmol/L — ABNORMAL LOW (ref 22–32)
Calcium: 8.4 mg/dL — ABNORMAL LOW (ref 8.9–10.3)
Chloride: 102 mmol/L (ref 98–111)
Creatinine, Ser: 0.97 mg/dL (ref 0.61–1.24)
GFR, Estimated: >60 mL/min (ref >60)
Glucose, Bld: 170 mg/dL — ABNORMAL HIGH (ref 70–99)
Potassium: 3.7 mmol/L (ref 3.5–5.1)
Sodium: 132 mmol/L — ABNORMAL LOW (ref 135–145)

## 2022-06-28 LAB — CBC
HCT: 27 % — ABNORMAL LOW (ref 39.0–52.0)
Hemoglobin: 9.3 g/dL — ABNORMAL LOW (ref 13.0–17.0)
MCH: 28.3 pg (ref 26.0–34.0)
MCHC: 34.4 g/dL (ref 30.0–36.0)
MCV: 82.1 fL (ref 80.0–100.0)
Platelets: 197 10*3/uL (ref 150–400)
RBC: 3.29 MIL/uL — ABNORMAL LOW (ref 4.22–5.81)
RDW: 14.6 % (ref 11.5–15.5)
WBC: 6.9 10*3/uL (ref 4.0–10.5)
nRBC: 0 % (ref 0.0–0.2)

## 2022-06-28 LAB — GLUCOSE, CAPILLARY
Glucose-Capillary: 161 mg/dL — ABNORMAL HIGH (ref 70–99)
Glucose-Capillary: 168 mg/dL — ABNORMAL HIGH (ref 70–99)
Glucose-Capillary: 141 mg/dL — ABNORMAL HIGH (ref 70–99)
Glucose-Capillary: 221 mg/dL — ABNORMAL HIGH (ref 70–99)
Glucose-Capillary: 112 mg/dL — ABNORMAL HIGH (ref 70–99)

## 2022-06-28 LAB — CULTURE, BLOOD (ROUTINE X 2)

## 2022-06-28 MED ORDER — GLYCOPYRROLATE 0.2 MG/ML IJ SOLN: 0.2000 mg | INTRAMUSCULAR | Status: DC | PRN

## 2022-06-28 MED ORDER — GLYCOPYRROLATE 1 MG PO TABS: 1.0000 mg | ORAL_TABLET | ORAL | Status: DC | PRN

## 2022-06-28 MED ORDER — PYRIDOSTIGMINE BROMIDE 60 MG PO TABS
60.0000 mg | ORAL_TABLET | Freq: Three times a day (TID) | ORAL | Status: DC
  Administered 2022-06-28 – 2022-07-07 (×28): 60 mg
  Filled 2022-06-28 (×31): qty 1

## 2022-06-28 MED ORDER — ACETAMINOPHEN 650 MG RE SUPP
650.0000 mg | Freq: Four times a day (QID) | RECTAL | Status: DC | PRN
Start: 2022-06-28 — End: 2022-06-28

## 2022-06-28 MED ORDER — SODIUM CHLORIDE 0.9 % IV SOLN: INTRAVENOUS | Status: DC

## 2022-06-28 MED ORDER — LIDOCAINE HCL URETHRAL/MUCOSAL 2 % EX GEL
1.0000 | Freq: Once | CUTANEOUS | Status: AC
  Administered 2022-06-28: 1 via TOPICAL
  Filled 2022-06-28: qty 11

## 2022-06-28 MED ORDER — POLYVINYL ALCOHOL 1.4 % OP SOLN
1.0000 [drp] | Freq: Four times a day (QID) | OPHTHALMIC | Status: DC | PRN
Start: 2022-06-28 — End: 2022-06-28

## 2022-06-28 MED ORDER — ACETAMINOPHEN 325 MG PO TABS
650.0000 mg | ORAL_TABLET | Freq: Four times a day (QID) | ORAL | Status: DC | PRN
Start: 2022-06-28 — End: 2022-06-28

## 2022-06-28 MED ORDER — POTASSIUM CHLORIDE 20 MEQ PO PACK
20.0000 meq | PACK | Freq: Once | ORAL | Status: AC
  Administered 2022-06-28: 20 meq
  Filled 2022-06-28: qty 1

## 2022-06-28 MED ORDER — FENTANYL CITRATE PF 50 MCG/ML IJ SOSY: 25.0000 ug | PREFILLED_SYRINGE | INTRAMUSCULAR | Status: DC | PRN

## 2022-06-28 MED ORDER — PHENTOLAMINE MESYLATE 5 MG IJ SOLR
5.0000 mg | Freq: Once | INTRAMUSCULAR | Status: AC
  Administered 2022-06-28: 5 mg via SUBCUTANEOUS
  Filled 2022-06-28: qty 5

## 2022-06-28 NOTE — Progress Notes (Signed)
Inpatient Rehab Admissions Coordinator:   Therapy updated d/c recommendations to CIR and pt may be appropriate once extubated.  Will follow.   Estill Dooms, PT, DPT Admissions Coordinator [phone] 06/28/22  5:32 PM

## 2022-06-28 NOTE — Progress Notes (Addendum)
Subjective: Continues to be intubated. MRI brain overnight for subjectively increased right upper extremity weakness was negative for any acute abnormality.   Objective: Current vital signs: BP 115/66   Pulse 63   Temp (!) 97.5 F (36.4 C) (Axillary)   Resp (!) 22   Ht 5\' 5"  (1.651 m)   Wt 56 kg   SpO2 98%   BMI 20.54 kg/m  Vital signs in last 24 hours: Temp:  [97.4 F (36.3 C)-99.6 F (37.6 C)] 97.5 F (36.4 C) (05/29 0400) Pulse Rate:  [55-119] 63 (05/29 0700) Resp:  [16-28] 22 (05/29 0700) BP: (86-143)/(55-87) 115/66 (05/29 0700) SpO2:  [96 %-100 %] 98 % (05/29 0700) FiO2 (%):  [40 %] 40 % (05/29 0328)  Intake/Output from previous day: 05/28 0701 - 05/29 0700 In: 3116.4 [I.V.:772.6; NG/GT:1375; IV Piggyback:968.8] Out: 1000 [Urine:1000] Intake/Output this shift: No intake/output data recorded. Nutritional status:  Diet Order             Diet NPO time specified  Diet effective now                  HEENT: [place]/AT Lungs: Intubated   Neurologic Exam: Ment: Awake and alert. Follows all commands. Unable to assess speech output due to intubation.   CN: PERRL. Left eye with full EOM. Right eye with incomplete adduction and lags left eye with EOM. Right ptosis is present. Unchanged from yesterday.  Motor: 4/5 BUE proximally and distally 4/5 BLE proximally and distally Normal muscle bulk and tone.  Sensory: Intact to FT x 4 Cerebellar: No ataxia with FNF bilaterally  Lab Results: Results for orders placed or performed during the hospital encounter of 06/21/22 (from the past 48 hour(s))  Glucose, capillary     Status: Abnormal   Collection Time: 06/26/22  7:55 AM  Result Value Ref Range   Glucose-Capillary 180 (H) 70 - 99 mg/dL    Comment: Glucose reference range applies only to samples taken after fasting for at least 8 hours.  Glucose, capillary     Status: Abnormal   Collection Time: 06/26/22 11:26 AM  Result Value Ref Range   Glucose-Capillary 149 (H) 70 -  99 mg/dL    Comment: Glucose reference range applies only to samples taken after fasting for at least 8 hours.  Glucose, capillary     Status: Abnormal   Collection Time: 06/26/22  3:31 PM  Result Value Ref Range   Glucose-Capillary 209 (H) 70 - 99 mg/dL    Comment: Glucose reference range applies only to samples taken after fasting for at least 8 hours.  Glucose, capillary     Status: Abnormal   Collection Time: 06/26/22  7:54 PM  Result Value Ref Range   Glucose-Capillary 109 (H) 70 - 99 mg/dL    Comment: Glucose reference range applies only to samples taken after fasting for at least 8 hours.  Blood gas, arterial     Status: Abnormal   Collection Time: 06/26/22  9:42 PM  Result Value Ref Range   pH, Arterial 7.3 (L) 7.35 - 7.45   pCO2 arterial 54 (H) 32 - 48 mmHg   pO2, Arterial 64 (L) 83 - 108 mmHg   Bicarbonate 26.1 20.0 - 28.0 mmol/L   Acid-base deficit 1.1 0.0 - 2.0 mmol/L   O2 Saturation 91.6 %   Patient temperature 37.3    Collection site RIGHT RADIAL    Allens test (pass/fail) PASS PASS    Comment: Performed at [facility], [address] [place], [place] [address]  Culture, Respiratory w Gram Stain     Status: None (Preliminary result)   Collection Time: 06/26/22 10:22 PM   Specimen: Tracheal Aspirate; Respiratory  Result Value Ref Range   Specimen Description TRACHEAL ASPIRATE    Special Requests NONE    Gram Stain      FEW WBC PRESENT,BOTH PMN AND MONONUCLEAR FEW GRAM POSITIVE COCCI IN PAIRS Performed at [facility], [address], [place], [place] [address]    Culture PENDING    Report Status PENDING   Glucose, capillary     Status: Abnormal   Collection Time: 06/26/22 11:47 PM  Result Value Ref Range   Glucose-Capillary 234 (H) 70 - 99 mg/dL    Comment: Glucose reference range applies only to samples taken after fasting for at least 8 hours.  Glucose, capillary     Status: Abnormal   Collection Time: 06/27/22  3:47 AM  Result Value Ref Range    Glucose-Capillary 232 (H) 70 - 99 mg/dL    Comment: Glucose reference range applies only to samples taken after fasting for at least 8 hours.  Basic metabolic panel     Status: Abnormal   Collection Time: 06/27/22  6:02 AM  Result Value Ref Range   Sodium 131 (L) 135 - 145 mmol/L   Potassium 4.5 3.5 - 5.1 mmol/L   Chloride 97 (L) 98 - 111 mmol/L   CO2 20 (L) 22 - 32 mmol/L   Glucose, Bld 191 (H) 70 - 99 mg/dL    Comment: Glucose reference range applies only to samples taken after fasting for at least 8 hours.   BUN 26 (H) 8 - 23 mg/dL   Creatinine, Ser 0.98 0.61 - 1.24 mg/dL   Calcium 8.6 (L) 8.9 - 10.3 mg/dL   GFR, Estimated >11 >91 mL/min    Comment: (NOTE) Calculated using the CKD-EPI Creatinine Equation (2021)    Anion gap 14 5 - 15    Comment: Performed at [facility], [address] [place], [place] [address]  Triglycerides     Status: None   Collection Time: 06/27/22  6:02 AM  Result Value Ref Range   Triglycerides 109 <150 mg/dL    Comment: Performed at [facility], [address] [place], [place] [address]  Glucose, capillary     Status: Abnormal   Collection Time: 06/27/22  7:44 AM  Result Value Ref Range   Glucose-Capillary 195 (H) 70 - 99 mg/dL    Comment: Glucose reference range applies only to samples taken after fasting for at least 8 hours.  Glucose, capillary     Status: Abnormal   Collection Time: 06/27/22 11:31 AM  Result Value Ref Range   Glucose-Capillary 218 (H) 70 - 99 mg/dL    Comment: Glucose reference range applies only to samples taken after fasting for at least 8 hours.  Glucose, capillary     Status: Abnormal   Collection Time: 06/27/22  3:38 PM  Result Value Ref Range   Glucose-Capillary 168 (H) 70 - 99 mg/dL    Comment: Glucose reference range applies only to samples taken after fasting for at least 8 hours.  Glucose, capillary     Status: Abnormal   Collection Time: 06/27/22  7:51 PM  Result Value Ref Range    Glucose-Capillary 160 (H) 70 - 99 mg/dL    Comment: Glucose reference range applies only to samples taken after fasting for at least 8 hours.  Glucose, capillary     Status: Abnormal   Collection Time: 06/27/22 11:48 PM  Result Value Ref Range   Glucose-Capillary 155 (H) 70 - 99 mg/dL    Comment: Glucose reference range applies only to samples taken after fasting for at least 8 hours.  CBC     Status: Abnormal   Collection Time: 06/28/22  1:17 AM  Result Value Ref Range   WBC 6.9 4.0 - 10.5 K/uL   RBC 3.29 (L) 4.22 - 5.81 MIL/uL   Hemoglobin 9.3 (L) 13.0 - 17.0 g/dL   HCT 16.1 (L) 09.6 - 04.5 %   MCV 82.1 80.0 - 100.0 fL   MCH 28.3 26.0 - 34.0 pg   MCHC 34.4 30.0 - 36.0 g/dL   RDW 40.9 81.1 - 91.4 %   Platelets 197 150 - 400 K/uL   nRBC 0.0 0.0 - 0.2 %    Comment: Performed at [facility], [address] [place], [place] [address]  Basic metabolic panel     Status: Abnormal   Collection Time: 06/28/22  1:17 AM  Result Value Ref Range   Sodium 132 (L) 135 - 145 mmol/L   Potassium 3.7 3.5 - 5.1 mmol/L   Chloride 102 98 - 111 mmol/L   CO2 20 (L) 22 - 32 mmol/L   Glucose, Bld 170 (H) 70 - 99 mg/dL    Comment: Glucose reference range applies only to samples taken after fasting for at least 8 hours.   BUN 28 (H) 8 - 23 mg/dL   Creatinine, Ser 6.21 0.61 - 1.24 mg/dL   Calcium 8.4 (L) 8.9 - 10.3 mg/dL   GFR, Estimated >30 >86 mL/min    Comment: (NOTE) Calculated using the CKD-EPI Creatinine Equation (2021)    Anion gap 10 5 - 15    Comment: Performed at [facility], [address] [place], [place] [address]  Glucose, capillary     Status: Abnormal   Collection Time: 06/28/22  3:27 AM  Result Value Ref Range   Glucose-Capillary 161 (H) 70 - 99 mg/dL    Comment: Glucose reference range applies only to samples taken after fasting for at least 8 hours.    Recent Results (from the past 240 hour(s))  SARS Coronavirus 2 by RT PCR (hospital order, performed in [facility] hospital lab) *cepheid single result test* Anterior Nasal Swab     Status: None   Collection Time: 06/21/22  5:41 PM   Specimen: Anterior Nasal Swab  Result Value Ref Range Status   SARS Coronavirus 2 by RT PCR NEGATIVE NEGATIVE Final    Comment: Performed at [facility], [address] [place], [place] [address]  MRSA Next Gen by PCR, Nasal     Status: None   Collection Time: 06/21/22  8:26 PM   Specimen: Nasal Mucosa; Nasal Swab  Result Value Ref Range Status   MRSA by PCR Next Gen NOT DETECTED NOT DETECTED Final    Comment: (NOTE) The GeneXpert MRSA Assay (FDA approved for NASAL specimens only), is one component of a comprehensive MRSA colonization surveillance program. It is not intended to diagnose MRSA infection nor to guide or monitor treatment for MRSA infections. Test performance is not FDA approved in patients less than 24 years old. Performed at [facility], [address], [place], [place] [address]   Culture, blood (Routine X 2) w Reflex to ID Panel     Status: None (Preliminary result)  Collection Time: 06/24/22  4:06 AM   Specimen: BLOOD LEFT ARM  Result Value Ref Range Status   Specimen Description BLOOD LEFT ARM  Final   Special Requests   Final    BOTTLES DRAWN AEROBIC AND ANAEROBIC Blood Culture results may not be optimal due to an excessive volume of blood received in culture bottles   Culture   Final    NO GROWTH 3 DAYS Performed at [facility], [address], [place], [place] [address]    Report Status PENDING  Incomplete  Culture, blood (Routine X 2) w Reflex to ID Panel     Status: None (Preliminary result)   Collection Time: 06/24/22  4:06 AM   Specimen: BLOOD LEFT ARM  Result Value Ref Range Status   Specimen Description BLOOD LEFT ARM  Final   Special Requests   Final    BOTTLES DRAWN AEROBIC AND ANAEROBIC Blood Culture results may not be optimal due to an excessive volume of blood received in culture bottles   Culture    Final    NO GROWTH 3 DAYS Performed at [facility], [address] [place], [place] [address]    Report Status PENDING  Incomplete  Culture, Respiratory w Gram Stain     Status: None (Preliminary result)   Collection Time: 06/24/22  4:31 AM   Specimen: Tracheal Aspirate; Respiratory  Result Value Ref Range Status   Specimen Description TRACHEAL ASPIRATE  Final   Special Requests NONE  Final   Gram Stain   Final    ABUNDANT WBC PRESENT,BOTH PMN AND MONONUCLEAR RARE GRAM POSITIVE COCCI IN PAIRS    Culture   Final    FEW STAPHYLOCOCCUS AUREUS SUSCEPTIBILITIES TO FOLLOW Performed at [facility], [address], [place], [place] [address]    Report Status PENDING  Incomplete  Respiratory (~20 pathogens) panel by PCR     Status: None   Collection Time: 06/24/22 11:06 AM   Specimen: Nasopharyngeal Swab; Respiratory  Result Value Ref Range Status   Adenovirus NOT DETECTED NOT DETECTED Final   Coronavirus 229E NOT DETECTED NOT DETECTED Final    Comment: (NOTE) The Coronavirus on the Respiratory Panel, DOES NOT test for the novel  Coronavirus (2019 nCoV)    Coronavirus HKU1 NOT DETECTED NOT DETECTED Final   Coronavirus NL63 NOT DETECTED NOT DETECTED Final   Coronavirus OC43 NOT DETECTED NOT DETECTED Final   Metapneumovirus NOT DETECTED NOT DETECTED Final   Rhinovirus / Enterovirus NOT DETECTED NOT DETECTED Final   Influenza A NOT DETECTED NOT DETECTED Final   Influenza B NOT DETECTED NOT DETECTED Final   Parainfluenza Virus 1 NOT DETECTED NOT DETECTED Final   Parainfluenza Virus 2 NOT DETECTED NOT DETECTED Final   Parainfluenza Virus 3 NOT DETECTED NOT DETECTED Final   Parainfluenza Virus 4 NOT DETECTED NOT DETECTED Final   Respiratory Syncytial Virus NOT DETECTED NOT DETECTED Final   Bordetella pertussis NOT DETECTED NOT DETECTED Final   Bordetella Parapertussis NOT DETECTED NOT DETECTED Final   Chlamydophila pneumoniae NOT DETECTED NOT DETECTED Final   Mycoplasma  pneumoniae NOT DETECTED NOT DETECTED Final    Comment: Performed at [facility], [address] [place], [place] [address]  Culture, Respiratory w Gram Stain     Status: None (Preliminary result)   Collection Time: 06/26/22 10:22 PM   Specimen: Tracheal Aspirate; Respiratory  Result Value Ref Range Status   Specimen Description TRACHEAL ASPIRATE  Final   Special  Requests NONE  Final   Gram Stain   Final    FEW WBC PRESENT,BOTH PMN AND MONONUCLEAR FEW GRAM POSITIVE COCCI IN PAIRS Performed at [facility], [address] [place], [place] [address]    Culture PENDING  Incomplete   Report Status PENDING  Incomplete    Lipid Panel Recent Labs    06/27/22 0602  TRIG 109    Studies/Results: MR BRAIN WO CONTRAST  Result Date: 06/27/2022 CLINICAL DATA:  Stroke suspected EXAM: MRI HEAD WITHOUT CONTRAST TECHNIQUE: Multiplanar, multiecho pulse sequences of the brain and surrounding structures were obtained without intravenous contrast. COMPARISON:  No prior MRI available, correlation made with CT head 09/17/2017 FINDINGS: Brain: No restricted diffusion to suggest acute or subacute infarct. No acute hemorrhage, mass, mass effect, or midline shift. No hydrocephalus or extra-axial collection. Normal pituitary and craniocervical junction. No hemosiderin deposition to suggest remote hemorrhage. Scattered T2 hyperintense signal in the periventricular white matter, likely the sequela of mild chronic small vessel ischemic disease. Vascular: Normal arterial flow voids. Skull and upper cervical spine: Normal marrow signal. Sinuses/Orbits: Mucous retention cysts in the maxillary sinuses. Otherwise clear paranasal sinuses. Air-fluid level in the nasopharynx, likely secondary to intubation. Status post bilateral lens replacements. Other: Trace fluid in the mastoid air cells. IMPRESSION: No acute intracranial process. No evidence of acute or subacute infarct. Electronically Signed   By: Wiliam Ke M.D.   On: 06/27/2022 22:21   Portable Chest x-ray  Result Date: 06/26/2022 CLINICAL DATA:  ET tube EXAM: PORTABLE CHEST 1 VIEW COMPARISON:  06/24/2022 FINDINGS: Endotracheal tube and feeding tube are in stable position. Increasing right infrahilar airspace opacity. Left lung clear. No effusions. Heart and mediastinal contours within normal limits. Prior CABG. IMPRESSION: Support devices stable. Increasing right infrahilar atelectasis or infiltrate. Electronically Signed   By: Charlett Nose M.D.   On: 06/26/2022 21:11    Medications: Scheduled:  Chlorhexidine Gluconate Cloth  6 each Topical Daily   clonazepam  0.25 mg Per Tube BID   docusate  100 mg Per Tube BID   famotidine  20 mg Per Tube BID   heparin  5,000 Units Subcutaneous Q8H   insulin aspart  0-20 Units Subcutaneous Q4H   mouth rinse  15 mL Mouth Rinse Q2H   polyethylene glycol  17 g Per Tube Daily   predniSONE  10 mg Per Tube Q breakfast   pyridostigmine  30 mg Per Tube Q8H   Continuous:  sodium chloride 999 mL/hr at 06/26/22 2259   ampicillin-sulbactam (UNASYN) IV Stopped (06/28/22 6045)   dexmedetomidine (PRECEDEX) IV infusion 0.6 mcg/kg/hr (06/28/22 0700)   feeding supplement (VITAL AF 1.2 CAL) 55 mL/hr at 06/28/22 0700   norepinephrine (LEVOPHED) Adult infusion 1 mcg/min (06/28/22 0700)    Assessment: 66/M, hx seropositive myasthenia gravis recently admitted for exacerbation. He improved on IVIG and was discharged to home on 5/20, at which point his respiratory function was vastly improved although he did have some residual ptosis. He then re-presented on 5/22, 3 days after discharge, with respiratory distress and NIF -19. He was reintubated in the ED due to concerns for increasing hypercarbia on his blood gases. He was started on a repeat course of IVIG after weighing IVIG versus PLEX. Extubation has been a challenge-unclear if this is more related to anxiety or diaphragmatic weakness/respiratory muscle weakness. MRI  brain obtained overnight (5/28-29) for stroke like symptoms of worsened RUE weakness revealed no acute intracranial process.  - Exam today reveals 4/5 strength  x 4. Awake and alert.  - Both his inpatient and outpatient neurologists have raised the concerns that some of his symptoms and exacerbations may be more somatoform in etiology 2/2 his severe anxiety 2/2 PTSD may be playing a role. - He completed his 5 day course of IVIG yesterday (5/28) - Continues to be intubated.     Impression:  MG exacerbation with possible component to his presentation secondary to somatoform disorder   Recommendations: - Continue home prednisone - Decreased Mestinon to 30 mg tid yesterday (Tuesday) given excessive secretions, which may have played a role in his air hunger requiring reintubation overnight on 5/27-5/28).  - On Klonopin 0.25 mg BID for anxiety - After he is extubated, will need speech therapy for input on degree of pharyngeal weakness/swallowing function to determine if there is objective weakness of swallowing and clearing of secretions that may be leading to his recurrent bouts of respiratory distress.  - Daily FVC and NIFs with respiratory therapy after extubation. Please record results in progress notes.   - Medications that may worsen or trigger MG exacerbation: Class IA antiarrhythmics, magnesium, flouroquinolones, macrolides, aminoglycosides, penicillamine, curare, interferon alpha, botox, quinine. Use with caution: calcium channel blockers, beta blockers and statins.  35 minutes spent in the neurological evaluation and management of this critically ill patient.    LOS: 7 days   @Electronically  signed: Dr. Caryl Pina 06/28/2022  7:25 AM

## 2022-06-28 NOTE — Progress Notes (Signed)
eLink Physician-Brief Progress Note Patient Name: Rohn Welden DOB: 1955-06-08 MRN: [id number]   Date of Service  06/28/2022  HPI/Events of Note  Notified that levophed had infiltration in the right arm.  Pharmacy was notified and phentolamine administered.  Warm compress applied.  Arm elevated.   eICU Interventions  Agree with current management.      Intervention Category Minor Interventions: Communication with other healthcare providers and/or family  Larinda Buttery 06/28/2022, 11:46 PM

## 2022-06-28 NOTE — Progress Notes (Signed)
NAME:  [name], MRN:  [id number], DOB:  [birth year], LOS: 7 ADMISSION DATE:  06/21/2022, CONSULTATION DATE:  5/22 REFERRING MD:  Karene Fry, CHIEF COMPLAINT:  SOB   History of Present Illness:  67 year old man w/ hx of MG and recent admit for flare presenting to ER for worsening SOB.  Low NIF in ER with extreme tachypnea so intubated for airway protection.  No infectious prodrome, seems to have started just after discharged 2 days ago.  History per wife at bedside.  Neurology is following.  Pertinent  Medical History  MG DM2 Anxiety HTN Significant Hospital Events: Including procedures, antibiotic start and stop dates in addition to other pertinent events   5/22 admit 5/23 given Mg for replenishment, which exacerbated his MG symptoms. Extubation postponed. 5/24 pt extubate, reintubated. NIFs were -10 to -15 before reintubation. 5/27 extubated and reintubated 9h later due to ineffective cough.  5/28 MRI negative for stroke.   Interim History / Subjective:    Objective   Blood pressure 115/66, pulse 63, temperature (!) 97.5 F (36.4 C), temperature source Axillary, resp. rate (!) 22, height 5\' 5"  (1.651 m), weight 56 kg, SpO2 98 %.    Vent Mode: PRVC FiO2 (%):  [40 %] 40 % Set Rate:  [22 bmp] 22 bmp Vt Set:  [490 mL] 490 mL PEEP:  [5 cmH20] 5 cmH20 Plateau Pressure:  [18 cmH20-20 cmH20] 18 cmH20   Intake/Output Summary (Last 24 hours) at 06/28/2022 0752 Last data filed at 06/28/2022 0700 Gross per 24 hour  Intake 3116.39 ml  Output 1000 ml  Net 2116.39 ml    Filed Weights   06/23/22 0709 06/25/22 0500 06/26/22 0453  Weight: 52.7 kg 56 kg 56 kg    Examination: General - Slender build HEENT - orally intubated, nasogastric tube in place. Resp - diffuse rhonchi.  Abdo - soft Neuro - awake and able to follow commands and communicate by writing, strength 4+/5. Eyes remain dysconjugate.   Ancillary tests personally reviewed:   Na: 132 MRI brain negative for  stroke. Assessment & Plan:  # Acute hypoxemic and hypercarbic respiratory failure due to recurrent MG crisis with unclear trigger.  Now required 2nd reintubation.  Patient has refused tracheostomy and PICC placement.  Anxiety/PTSD component as well.  -continue tube feeds  -home MG medications, will  increase mestinon to hopefully increase bulbar strength. Monitor for secretions.  -continue second round IVIG per neuro. Will discuss with them what are next steps.  Patient is asking about PLEX -continue clonazepam for anxiety - complete 5 days of antibiotics for aspiration.  - Low dose Precedex for comfort.  - My sense is that the patient is failing extubation due to impaired ability to manage secretions, will try Metaneb/CoughAssist. Have also informed patient that some cooperation on his part with therapy will be important.   # DM2 without hyperglycemia -Sliding Scale insulin   Best Practice (right click and "Reselect all SmartList Selections" daily)   Diet/type: NPO - resume tube feeds DVT prophylaxis: prophylactic heparin  GI prophylaxis: PPI Lines: N/A Foley:  Yes, and it is still needed Code Status:  full code Last date of multidisciplinary goals of care discussion [wife updated at bedside]  CRITICAL CARE Performed by: Lynnell Catalan   Total critical care time: 35 minutes  Critical care time was exclusive of separately billable procedures and treating other patients.  Critical care was necessary to treat or prevent imminent or life-threatening deterioration.  Critical care was time spent personally by  me on the following activities: development of treatment plan with patient and/or surrogate as well as nursing, discussions with consultants, evaluation of patient's response to treatment, examination of patient, obtaining history from patient or surrogate, ordering and performing treatments and interventions, ordering and review of laboratory studies, ordering and review of  radiographic studies, pulse oximetry, re-evaluation of patient's condition and participation in multidisciplinary rounds.  Lynnell Catalan, MD [facility] ICU Physician [facility]  Pager: [phone] Mobile: [phone] After hours: [phone].

## 2022-06-28 NOTE — Progress Notes (Signed)
Pt placed on SBT this morning at 0752, tolerating well.    06/28/22 0752  Vent Select  Invasive or Noninvasive Invasive  Adult Vent Y  Airway 7.5 mm  Placement Date/Time: 06/26/22 2100   Inserted prior to hospital arrival?: Other (Comment)  Difficult airway due to:: Difficulty was unanticipated  Grade View: Grade 1  Placed By: ICU physician  Airway Device: Endotracheal Tube  Laryngoscope Blade: MAC...  Secured at (cm) 23 cm  Measured From Lips  Secured Location Center  Secured By Commercial Tube Holder  Tube Holder Repositioned Yes  Cuff Pressure (cm H2O) Clear OR 27-39 CmH2O  Site Condition Dry  Adult Ventilator Settings  Vent Type Servo i  Humidity HME  Vent Mode PSV;CPAP  FiO2 (%) 40 %  Pressure Support 5 cmH20  PEEP 5 cmH20  Adult Ventilator Measurements  Peak Airway Pressure 10 L/min  Mean Airway Pressure 7 cmH20  Resp Rate Spontaneous 24 br/min  Resp Rate Total 24 br/min  Spont TV 326 mL  Measured Ve 8.7 L  SpO2 99 %  Adult Ventilator Alarms  Alarms On Y  Ve High Alarm 18 L/min  Ve Low Alarm 4 L/min  Resp Rate High Alarm 42 br/min  Resp Rate Low Alarm 4  PEEP Low Alarm 3 cmH2O  Press High Alarm 45 cmH2O  T Apnea 20 sec(s)  VAP Prevention  HOB> 30 Degrees Y  Equipment wiped down Yes  Daily Weaning Assessment  Daily Assessment of Readiness to Wean Wean protocol criteria met (SBT performed)  SBT Method CPAP 5 cm H20 and PS 5 cm H20  Weaning Start Time 0752  Breath Sounds  Bilateral Breath Sounds Clear;Diminished  R Upper  Breath Sounds Clear;Diminished  L Upper Breath Sounds Clear;Diminished  R Lower Breath Sounds Diminished  L Lower Breath Sounds Diminished  Vent Respiratory Assessment  Level of Consciousness Alert  Respiratory Pattern Regular;Unlabored  Patient Tolerance Tolerated well  Suction Method  Respiratory Interventions Airway suction  Airway Suctioning/Secretions  Suction Type ETT  Suction Device  Catheter  Secretion Amount Moderate   Secretion Color White;Yellow  Secretion Consistency Frothy;Thick  Suction Tolerance Tolerated well  Suctioning Adverse Effects None

## 2022-06-28 NOTE — Progress Notes (Addendum)
Upon assessment of right UG FA IV at 2145, site is swollen, taut, slightly red, and tender to touch.  Levo infusing at 1 mcg/min through IV.  IV watch in place but did not alarm.  Levo stopped immediately.  BP stable, so levo left off.  IV team consult placed for new access.  Pharmacy notified of infiltration.  Phentolamine injection kit ordered by pharmacy.  IV removed.  This RN administered phentolamine per admin instructions.  Topical lidocaine jelly applied prior to help with pain from the injections.  Warm compress applied to site and arm elevated.

## 2022-06-28 NOTE — Progress Notes (Signed)
Pt currently has R ant FA USGPIV.

## 2022-06-28 NOTE — Progress Notes (Signed)
Physical Therapy Treatment Patient Details Name: [name] MRN: [id number] DOB: 05-21-1955 Today's Date: 06/28/2022   History of Present Illness Pt is a 67 y.o. M who presents 06/16/2022 with complaints of myasthenia gravis flare; over the past 4 weeks he has been having worsening ptosis in the right eye, right face and arm weakness, difficulty swallowing, difficulty breathing. extubated and re-intubated 06/26/22 Significant PMH: myasthenia gravis, hepatitis B, T2DM, HTN.    PT Comments    Patient progressing with mobility and able to ambulate in hallway today with RT assisting with vent and with chair follow.  Fatigued after ambulation but tolerating with VSS throughout on PRVC @ 40% FiO2.  Patient with improved sitting balance compared to last session.  Feel he may need intensive inpatient rehab prior to d/c home.    Recommendations for follow up therapy are one component of a multi-disciplinary discharge planning process, led by the attending physician.  Recommendations may be updated based on patient status, additional functional criteria and insurance authorization.  Follow Up Recommendations       Assistance Recommended at Discharge Intermittent Supervision/Assistance  Patient can return home with the following A little help with walking and/or transfers;Help with stairs or ramp for entrance;A little help with bathing/dressing/bathroom   Equipment Recommendations  Other (comment) (TBA pending progress)    Recommendations for Other Services       Precautions / Restrictions Precautions Precautions: Fall Precaution Comments: on vent this session, coretrak     Mobility  Bed Mobility Overal bed mobility: Needs Assistance Bed Mobility: Supine to Sit     Supine to sit: Min assist     General bed mobility comments: assist for lines    Transfers Overall transfer level: Needs assistance Equipment used: Rolling walker (2 wheels) Transfers: Sit to/from Stand Sit to Stand:  Min assist, +2 safety/equipment           General transfer comment: sit to stand x 3 with A for lines and balance    Ambulation/Gait Ambulation/Gait assistance: Min assist, +2 safety/equipment (+3 for vent and chair follow) Gait Distance (Feet): 50 Feet Assistive device: Rolling walker (2 wheels) Gait Pattern/deviations: Step-through pattern, Decreased stride length       General Gait Details: assist for balance, cues for self monitoring on vent throughout with RT managing and tech with chair follow   Stairs             Wheelchair Mobility    Modified Rankin (Stroke Patients Only)       Balance Overall balance assessment: Needs assistance Sitting-balance support: Feet supported Sitting balance-Leahy Scale: Fair Sitting balance - Comments: s for balance on EOB today   Standing balance support: Bilateral upper extremity supported Standing balance-Leahy Scale: Poor Standing balance comment: UE support on RW                            Cognition Arousal/Alertness: Awake/alert Behavior During Therapy: WFL for tasks assessed/performed Overall Cognitive Status: Within Functional Limits for tasks assessed                                 General Comments: writing to communicate        Exercises      General Comments General comments (skin integrity, edema, etc.): on PRVC mode 40% FiO2 PEEP 5, VSS throughout, wife in the room and supportive      Pertinent  Vitals/Pain Pain Assessment Pain Assessment: CPOT Facial Expression: Relaxed, neutral Body Movements: Absence of movements Muscle Tension: Relaxed Compliance with ventilator (intubated pts.): Tolerating ventilator or movement Vocalization (extubated pts.): N/A CPOT Total: 0    Home Living                          Prior Function            PT Goals (current goals can now be found in the care plan section) Progress towards PT goals: Progressing toward goals     Frequency    Min 3X/week      PT Plan Current plan remains appropriate    Co-evaluation              AM-PAC PT "6 Clicks" Mobility   Outcome Measure  Help needed turning from your back to your side while in a flat bed without using bedrails?: A Little Help needed moving from lying on your back to sitting on the side of a flat bed without using bedrails?: A Little Help needed moving to and from a bed to a chair (including a wheelchair)?: A Little Help needed standing up from a chair using your arms (e.g., wheelchair or bedside chair)?: A Little Help needed to walk in hospital room?: A Little Help needed climbing 3-5 steps with a railing? : Total 6 Click Score: 16    End of Session Equipment Utilized During Treatment: Gait belt;Other (comment) (vent) Activity Tolerance: Patient tolerated treatment well Patient left: in chair;with call bell/phone within reach;with family/visitor present   PT Visit Diagnosis: Muscle weakness (generalized) (M62.81);Other symptoms and signs involving the nervous system (R29.898)     Time: 0981-1914 PT Time Calculation (min) (ACUTE ONLY): 30 min  Charges:  $Gait Training: 8-22 mins $Therapeutic Activity: 8-22 mins                     Sheran Lawless, PT Acute Rehabilitation Services [phone] 06/28/2022    [name] 06/28/2022, 6:05 PM

## 2022-06-28 NOTE — Progress Notes (Signed)
An USGPIV (ultrasound guided PIV) has been placed for short-term vasopressor infusion. A correctly placed ivWatch must be used when administering Vasopressors. Should this treatment be needed beyond 72 hours, central line access should be obtained.  It will be the responsibility of the bedside nurse to follow best practice to prevent extravasations.   

## 2022-06-29 DIAGNOSIS — G7001 Myasthenia gravis with (acute) exacerbation: Secondary | ICD-10-CM | POA: Diagnosis not present

## 2022-06-29 LAB — GLUCOSE, CAPILLARY
Glucose-Capillary: 132 mg/dL — ABNORMAL HIGH (ref 70–99)
Glucose-Capillary: 153 mg/dL — ABNORMAL HIGH (ref 70–99)
Glucose-Capillary: 162 mg/dL — ABNORMAL HIGH (ref 70–99)
Glucose-Capillary: 165 mg/dL — ABNORMAL HIGH (ref 70–99)
Glucose-Capillary: 170 mg/dL — ABNORMAL HIGH (ref 70–99)
Glucose-Capillary: 219 mg/dL — ABNORMAL HIGH (ref 70–99)
Glucose-Capillary: 95 mg/dL (ref 70–99)

## 2022-06-29 LAB — CULTURE, BLOOD (ROUTINE X 2): Culture: NO GROWTH

## 2022-06-29 LAB — CULTURE, RESPIRATORY W GRAM STAIN

## 2022-06-29 MED ORDER — LORAZEPAM 2 MG/ML IJ SOLN
2.0000 mg | INTRAMUSCULAR | Status: DC | PRN
  Administered 2022-06-30 (×2): 2 mg via INTRAVENOUS
  Filled 2022-06-29 (×2): qty 1

## 2022-06-29 MED ORDER — DILTIAZEM HCL-DEXTROSE 125-5 MG/125ML-% IV SOLN (PREMIX)
5.0000 mg/h | INTRAVENOUS | Status: DC
  Administered 2022-06-29: 5 mg/h via INTRAVENOUS
  Administered 2022-06-30: 10 mg/h via INTRAVENOUS
  Administered 2022-07-01: 15 mg/h via INTRAVENOUS
  Administered 2022-07-01: 7.5 mg/h via INTRAVENOUS
  Administered 2022-07-01: 15 mg/h via INTRAVENOUS
  Filled 2022-06-29 (×5): qty 125

## 2022-06-29 MED ORDER — ORAL CARE MOUTH RINSE: 15.0000 mL | OROMUCOSAL | Status: DC | PRN

## 2022-06-29 MED ORDER — LORAZEPAM 2 MG/ML IJ SOLN
INTRAMUSCULAR | Status: AC
  Administered 2022-06-29: 2 mg via INTRAVENOUS
  Filled 2022-06-29: qty 1

## 2022-06-29 MED ORDER — ORAL CARE MOUTH RINSE: 15.0000 mL | OROMUCOSAL | Status: DC

## 2022-06-29 NOTE — Progress Notes (Signed)
NAME:  [name], MRN:  [id number], DOB:  Oct 01, 1955, LOS: 8 ADMISSION DATE:  06/21/2022, CONSULTATION DATE:  5/22 REFERRING MD:  Karene Fry, CHIEF COMPLAINT:  SOB   History of Present Illness:  67 year old man w/ hx of MG and recent admit for flare presenting to ER for worsening SOB.  Low NIF in ER with extreme tachypnea so intubated for airway protection.  No infectious prodrome, seems to have started just after discharged 2 days ago.  History per wife at bedside.  Neurology is following.  Pertinent  Medical History  MG DM2 Anxiety HTN Significant Hospital Events: Including procedures, antibiotic start and stop dates in addition to other pertinent events   5/22 admit 5/23 given Mg for replenishment, which exacerbated his MG symptoms. Extubation postponed. 5/24 pt extubate, reintubated. NIFs were -10 to -15 before reintubation. 5/27 extubated and reintubated 9h later due to ineffective cough.  5/28 MRI negative for stroke.   Interim History / Subjective:  NE IV infiltrated last pm. Phentolamine administered  Objective   Blood pressure (!) 121/55, pulse (!) 58, temperature 98.1 F (36.7 C), temperature source Axillary, resp. rate (!) 22, height 5\' 5"  (1.651 m), weight 55.2 kg, SpO2 100 %.    Vent Mode: PRVC FiO2 (%):  [40 %] 40 % Set Rate:  [22 bmp] 22 bmp Vt Set:  [490 mL] 490 mL PEEP:  [5 cmH20] 5 cmH20 Plateau Pressure:  [16 cmH20-18 cmH20] 16 cmH20   Intake/Output Summary (Last 24 hours) at 06/29/2022 0801 Last data filed at 06/29/2022 0700 Gross per 24 hour  Intake 1597.62 ml  Output 1150 ml  Net 447.62 ml    Filed Weights   06/25/22 0500 06/26/22 0453 06/29/22 0500  Weight: 56 kg 56 kg 55.2 kg    Examination: General - Slender build HEENT - orally intubated, nasogastric tube in place. Resp - clear Abdo - soft Neuro - awake and able to follow commands and communicate by writing, strength 4+/5. Eyes remain dysconjugate.  Ext:- area of induration right forearm  with no redness.   Ancillary tests personally reviewed:   Na: 132 MRI brain negative for stroke. Assessment & Plan:  # Acute hypoxemic and hypercarbic respiratory failure due to recurrent MG crisis with unclear trigger.  Now required 2nd reintubation.  Patient has refused tracheostomy and PICC placement.  Anxiety/PTSD component as well.  -continue tube feeds  -home MG medications, will  increase mestinon to hopefully increase bulbar strength. Monitor for secretions.  -continue second round IVIG per neuro. Will discuss with them what are next steps.  Patient is asking about PLEX, Transfer to [facility] -continue clonazepam for anxiety - complete 5 days of antibiotics for aspiration.  - Low dose Precedex for comfort.  - My sense is that the patient is failing extubation due to impaired ability to manage secretions, will try Metaneb/CoughAssist. Have also informed patient that some cooperation on his part with therapy will be important. There may be a significant anxiety overlay to this dyspnea and the consequences of remaining intubated are becoming manifest as he develops complications such as IV infiltration. Will get weaning parameters as another measure of respiratory muscle strength.  If these are acceptable, we may have reached our treatment goals and PLEX/[facility] referral may not be necessary. Early extubation today , continued Metaneb and supportive coaching. Cough and swallowing may also be transiently affected by the presence of the ETT, but this should get better in a few days.   # DM2 without hyperglycemia -Sliding  Scale insulin   Best Practice (right click and "Reselect all SmartList Selections" daily)   Diet/type: NPO - resume tube feeds DVT prophylaxis: prophylactic heparin  GI prophylaxis: PPI Lines: N/A Foley:  Yes, and it is still needed Code Status:  full code Last date of multidisciplinary goals of care discussion [wife updated at bedside]  CRITICAL CARE Performed by: Lynnell Catalan   Total critical care time: 35 minutes  Critical care time was exclusive of separately billable procedures and treating other patients.  Critical care was necessary to treat or prevent imminent or life-threatening deterioration.  Critical care was time spent personally by me on the following activities: development of treatment plan with patient and/or surrogate as well as nursing, discussions with consultants, evaluation of patient's response to treatment, examination of patient, obtaining history from patient or surrogate, ordering and performing treatments and interventions, ordering and review of laboratory studies, ordering and review of radiographic studies, pulse oximetry, re-evaluation of patient's condition and participation in multidisciplinary rounds.  Lynnell Catalan, MD [facility] ICU Physician [facility] [place] [facility]  Pager: [phone] Mobile: [phone] After hours: [phone].

## 2022-06-29 NOTE — Progress Notes (Signed)
Subjective: Extubated this AM, now on 2L [place] with unlabored breathing.  MAE, denies pain.   Objective: Current vital signs: BP 117/76   Pulse 75   Temp 98 F (36.7 C) (Oral)   Resp (!) 30   Ht 5\' 5"  (1.651 m)   Wt 55.2 kg   SpO2 98%   BMI 20.25 kg/m  Vital signs in last 24 hours: Temp:  [97.6 F (36.4 C)-98.9 F (37.2 C)] 98 F (36.7 C) (05/30 0800) Pulse Rate:  [48-75] 75 (05/30 0900) Resp:  [17-30] 30 (05/30 0900) BP: (89-151)/(45-117) 117/76 (05/30 0900) SpO2:  [96 %-100 %] 98 % (05/30 0900) FiO2 (%):  [40 %] 40 % (05/30 0330) Weight:  [55.2 kg] 55.2 kg (05/30 0500)  Intake/Output from previous day: 05/29 0701 - 05/30 0700 In: 1664.3 [I.V.:244.3; NG/GT:1320; IV Piggyback:100] Out: 1150 [Urine:1150] Intake/Output this shift: Total I/O In: 138.5 [I.V.:10.5; NG/GT:127.9] Out: -  Nutritional status:  Diet Order             Diet NPO time specified  Diet effective now                  HEENT: [place]/AT Lungs: 2L [place], unlabored.    Neurologic Exam: Ment: Awake and alert. Follows all commands. No dysarthria or aphasia. Quiet/soft voice, most likely due to recent extubation.   CN: PERRL. Left eye with full EOM. Right eye with incomplete abduction and lags left eye with EOM. Consistent right ptosis is present.  Motor: 4/5 BUE proximally and distally 4/5 BLE proximally and distally Normal muscle bulk and tone.  Sensory: Intact to FT x 4 Cerebellar: No ataxia with FNF bilaterally  Lab Results: Results for orders placed or performed during the hospital encounter of 06/21/22 (from the past 48 hour(s))  Glucose, capillary     Status: Abnormal   Collection Time: 06/27/22 11:31 AM  Result Value Ref Range   Glucose-Capillary 218 (H) 70 - 99 mg/dL    Comment: Glucose reference range applies only to samples taken after fasting for at least 8 hours.  Glucose, capillary     Status: Abnormal   Collection Time: 06/27/22  3:38 PM  Result Value Ref Range   Glucose-Capillary  168 (H) 70 - 99 mg/dL    Comment: Glucose reference range applies only to samples taken after fasting for at least 8 hours.  Glucose, capillary     Status: Abnormal   Collection Time: 06/27/22  7:51 PM  Result Value Ref Range   Glucose-Capillary 160 (H) 70 - 99 mg/dL    Comment: Glucose reference range applies only to samples taken after fasting for at least 8 hours.  Glucose, capillary     Status: Abnormal   Collection Time: 06/27/22 11:48 PM  Result Value Ref Range   Glucose-Capillary 155 (H) 70 - 99 mg/dL    Comment: Glucose reference range applies only to samples taken after fasting for at least 8 hours.  CBC     Status: Abnormal   Collection Time: 06/28/22  1:17 AM  Result Value Ref Range   WBC 6.9 4.0 - 10.5 K/uL   RBC 3.29 (L) 4.22 - 5.81 MIL/uL   Hemoglobin 9.3 (L) 13.0 - 17.0 g/dL   HCT 16.1 (L) 09.6 - 04.5 %   MCV 82.1 80.0 - 100.0 fL   MCH 28.3 26.0 - 34.0 pg   MCHC 34.4 30.0 - 36.0 g/dL   RDW 40.9 81.1 - 91.4 %   Platelets 197 150 - 400 K/uL  nRBC 0.0 0.0 - 0.2 %    Comment: Performed at [facility], [address], [place], [place] [address]  Basic metabolic panel     Status: Abnormal   Collection Time: 06/28/22  1:17 AM  Result Value Ref Range   Sodium 132 (L) 135 - 145 mmol/L   Potassium 3.7 3.5 - 5.1 mmol/L   Chloride 102 98 - 111 mmol/L   CO2 20 (L) 22 - 32 mmol/L   Glucose, Bld 170 (H) 70 - 99 mg/dL    Comment: Glucose reference range applies only to samples taken after fasting for at least 8 hours.   BUN 28 (H) 8 - 23 mg/dL   Creatinine, Ser 3.08 0.61 - 1.24 mg/dL   Calcium 8.4 (L) 8.9 - 10.3 mg/dL   GFR, Estimated >65 >78 mL/min    Comment: (NOTE) Calculated using the CKD-EPI Creatinine Equation (2021)    Anion gap 10 5 - 15    Comment: Performed at [facility], [address] [place], [place] [address]  Glucose, capillary     Status: Abnormal   Collection Time: 06/28/22  3:27 AM  Result Value Ref Range   Glucose-Capillary 161 (H) 70  - 99 mg/dL    Comment: Glucose reference range applies only to samples taken after fasting for at least 8 hours.  Glucose, capillary     Status: Abnormal   Collection Time: 06/28/22  7:48 AM  Result Value Ref Range   Glucose-Capillary 168 (H) 70 - 99 mg/dL    Comment: Glucose reference range applies only to samples taken after fasting for at least 8 hours.  Glucose, capillary     Status: Abnormal   Collection Time: 06/28/22 11:21 AM  Result Value Ref Range   Glucose-Capillary 141 (H) 70 - 99 mg/dL    Comment: Glucose reference range applies only to samples taken after fasting for at least 8 hours.  Glucose, capillary     Status: Abnormal   Collection Time: 06/28/22  3:37 PM  Result Value Ref Range   Glucose-Capillary 221 (H) 70 - 99 mg/dL    Comment: Glucose reference range applies only to samples taken after fasting for at least 8 hours.  Glucose, capillary     Status: Abnormal   Collection Time: 06/28/22  7:49 PM  Result Value Ref Range   Glucose-Capillary 112 (H) 70 - 99 mg/dL    Comment: Glucose reference range applies only to samples taken after fasting for at least 8 hours.  Glucose, capillary     Status: Abnormal   Collection Time: 06/29/22 12:03 AM  Result Value Ref Range   Glucose-Capillary 153 (H) 70 - 99 mg/dL    Comment: Glucose reference range applies only to samples taken after fasting for at least 8 hours.  Glucose, capillary     Status: Abnormal   Collection Time: 06/29/22  3:37 AM  Result Value Ref Range   Glucose-Capillary 165 (H) 70 - 99 mg/dL    Comment: Glucose reference range applies only to samples taken after fasting for at least 8 hours.  Glucose, capillary     Status: Abnormal   Collection Time: 06/29/22  7:55 AM  Result Value Ref Range   Glucose-Capillary 162 (H) 70 - 99 mg/dL    Comment: Glucose reference range applies only to samples taken after fasting for at least 8 hours.    Recent Results (from the past 240 hour(s))  SARS Coronavirus 2 by RT  PCR (hospital order,  performed in [facility] hospital lab) *cepheid single result test* Anterior Nasal Swab     Status: None   Collection Time: 06/21/22  5:41 PM   Specimen: Anterior Nasal Swab  Result Value Ref Range Status   SARS Coronavirus 2 by RT PCR NEGATIVE NEGATIVE Final    Comment: Performed at [facility], [address], [place], [place] [address]  MRSA Next Gen by PCR, Nasal     Status: None   Collection Time: 06/21/22  8:26 PM   Specimen: Nasal Mucosa; Nasal Swab  Result Value Ref Range Status   MRSA by PCR Next Gen NOT DETECTED NOT DETECTED Final    Comment: (NOTE) The GeneXpert MRSA Assay (FDA approved for NASAL specimens only), is one component of a comprehensive MRSA colonization surveillance program. It is not intended to diagnose MRSA infection nor to guide or monitor treatment for MRSA infections. Test performance is not FDA approved in patients less than 63 years old. Performed at [facility], [address], [place], [place] [address]   Culture, blood (Routine X 2) w Reflex to ID Panel     Status: None   Collection Time: 06/24/22  4:06 AM   Specimen: BLOOD LEFT ARM  Result Value Ref Range Status   Specimen Description BLOOD LEFT ARM  Final   Special Requests   Final    BOTTLES DRAWN AEROBIC AND ANAEROBIC Blood Culture results may not be optimal due to an excessive volume of blood received in culture bottles   Culture   Final    NO GROWTH 5 DAYS Performed at [facility], [address] [place], [place] [address]    Report Status 06/29/2022 FINAL  Final  Culture, blood (Routine X 2) w Reflex to ID Panel     Status: None   Collection Time: 06/24/22  4:06 AM   Specimen: BLOOD LEFT ARM  Result Value Ref Range Status   Specimen Description BLOOD LEFT ARM  Final   Special Requests   Final    BOTTLES DRAWN AEROBIC AND ANAEROBIC Blood Culture results may not be optimal due to an excessive volume of blood received in culture bottles   Culture    Final    NO GROWTH 5 DAYS Performed at [facility], [address], [place], [place] [address]    Report Status 06/29/2022 FINAL  Final  Culture, Respiratory w Gram Stain     Status: None   Collection Time: 06/24/22  4:31 AM   Specimen: Tracheal Aspirate; Respiratory  Result Value Ref Range Status   Specimen Description TRACHEAL ASPIRATE  Final   Special Requests NONE  Final   Gram Stain   Final    ABUNDANT WBC PRESENT,BOTH PMN AND MONONUCLEAR RARE GRAM POSITIVE COCCI IN PAIRS Performed at [facility], [address], [place], [place] [address]    Culture FEW STAPHYLOCOCCUS AUREUS  Final   Report Status 06/28/2022 FINAL  Final   Organism ID, Bacteria STAPHYLOCOCCUS AUREUS  Final      Susceptibility   Staphylococcus aureus - MIC*    CIPROFLOXACIN <=0.5 SENSITIVE Sensitive     ERYTHROMYCIN <=0.25 SENSITIVE Sensitive     GENTAMICIN <=0.5 SENSITIVE Sensitive     OXACILLIN 0.5 SENSITIVE Sensitive     TETRACYCLINE <=1 SENSITIVE Sensitive     VANCOMYCIN <=0.5 SENSITIVE Sensitive     TRIMETH/SULFA <=10 SENSITIVE Sensitive     CLINDAMYCIN <=0.25 SENSITIVE Sensitive     RIFAMPIN <=0.5  SENSITIVE Sensitive     Inducible Clindamycin NEGATIVE Sensitive     LINEZOLID 2 SENSITIVE Sensitive     * FEW STAPHYLOCOCCUS AUREUS  Respiratory (~20 pathogens) panel by PCR     Status: None   Collection Time: 06/24/22 11:06 AM   Specimen: Nasopharyngeal Swab; Respiratory  Result Value Ref Range Status   Adenovirus NOT DETECTED NOT DETECTED Final   Coronavirus 229E NOT DETECTED NOT DETECTED Final    Comment: (NOTE) The Coronavirus on the Respiratory Panel, DOES NOT test for the novel  Coronavirus (2019 nCoV)    Coronavirus HKU1 NOT DETECTED NOT DETECTED Final   Coronavirus NL63 NOT DETECTED NOT DETECTED Final   Coronavirus OC43 NOT DETECTED NOT DETECTED Final   Metapneumovirus NOT DETECTED NOT DETECTED Final   Rhinovirus / Enterovirus NOT DETECTED NOT DETECTED Final   Influenza A  NOT DETECTED NOT DETECTED Final   Influenza B NOT DETECTED NOT DETECTED Final   Parainfluenza Virus 1 NOT DETECTED NOT DETECTED Final   Parainfluenza Virus 2 NOT DETECTED NOT DETECTED Final   Parainfluenza Virus 3 NOT DETECTED NOT DETECTED Final   Parainfluenza Virus 4 NOT DETECTED NOT DETECTED Final   Respiratory Syncytial Virus NOT DETECTED NOT DETECTED Final   Bordetella pertussis NOT DETECTED NOT DETECTED Final   Bordetella Parapertussis NOT DETECTED NOT DETECTED Final   Chlamydophila pneumoniae NOT DETECTED NOT DETECTED Final   Mycoplasma pneumoniae NOT DETECTED NOT DETECTED Final    Comment: Performed at [facility], [address], [place], [place] [address]  Culture, Respiratory w Gram Stain     Status: None (Preliminary result)   Collection Time: 06/26/22 10:22 PM   Specimen: Tracheal Aspirate; Respiratory  Result Value Ref Range Status   Specimen Description TRACHEAL ASPIRATE  Final   Special Requests NONE  Final   Gram Stain   Final    FEW WBC PRESENT,BOTH PMN AND MONONUCLEAR FEW GRAM POSITIVE COCCI IN PAIRS    Culture   Final    FEW STAPHYLOCOCCUS AUREUS SUSCEPTIBILITIES TO FOLLOW Performed at [facility], [address] [place], [place] [address]    Report Status PENDING  Incomplete    Lipid Panel Recent Labs    06/27/22 0602  TRIG 109     Studies/Results: MR BRAIN WO CONTRAST  Result Date: 06/27/2022 CLINICAL DATA:  Stroke suspected EXAM: MRI HEAD WITHOUT CONTRAST TECHNIQUE: Multiplanar, multiecho pulse sequences of the brain and surrounding structures were obtained without intravenous contrast. COMPARISON:  No prior MRI available, correlation made with CT head 09/17/2017 FINDINGS: Brain: No restricted diffusion to suggest acute or subacute infarct. No acute hemorrhage, mass, mass effect, or midline shift. No hydrocephalus or extra-axial collection. Normal pituitary and craniocervical junction. No hemosiderin deposition to suggest remote  hemorrhage. Scattered T2 hyperintense signal in the periventricular white matter, likely the sequela of mild chronic small vessel ischemic disease. Vascular: Normal arterial flow voids. Skull and upper cervical spine: Normal marrow signal. Sinuses/Orbits: Mucous retention cysts in the maxillary sinuses. Otherwise clear paranasal sinuses. Air-fluid level in the nasopharynx, likely secondary to intubation. Status post bilateral lens replacements. Other: Trace fluid in the mastoid air cells. IMPRESSION: No acute intracranial process. No evidence of acute or subacute infarct. Electronically Signed   By: Wiliam Ke M.D.   On: 06/27/2022 22:21    Medications: Scheduled:  Chlorhexidine Gluconate Cloth  6 each Topical Daily   clonazepam  0.25 mg Per Tube BID   docusate  100 mg Per Tube BID  famotidine  20 mg Per Tube BID   heparin  5,000 Units Subcutaneous Q8H   insulin aspart  0-20 Units Subcutaneous Q4H   mouth rinse  15 mL Mouth Rinse 4 times per day   polyethylene glycol  17 g Per Tube Daily   predniSONE  10 mg Per Tube Q breakfast   pyridostigmine  60 mg Per Tube Q8H   Continuous:  sodium chloride 999 mL/hr at 06/26/22 2259   dexmedetomidine (PRECEDEX) IV infusion 0.4 mcg/kg/hr (06/29/22 0900)   feeding supplement (VITAL AF 1.2 CAL) Stopped (06/29/22 0825)   norepinephrine (LEVOPHED) Adult infusion 2 mcg/min (06/28/22 2300)    Assessment: 66/M, hx seropositive myasthenia gravis recently admitted for exacerbation. He improved on IVIG and was discharged to home on 5/20, at which point his respiratory function was vastly improved although he did have some residual ptosis. He then re-presented on 5/22, 3 days after discharge, with respiratory distress and NIF -19. He was reintubated in the ED due to concerns for increasing hypercarbia on his blood gases. He was started on a repeat course of IVIG after weighing IVIG versus PLEX. Extubation has been a challenge-unclear if this is more related to  anxiety or diaphragmatic weakness/respiratory muscle weakness. MRI brain obtained overnight (5/28-29) for stroke-like symptoms of worsened RUE weakness revealed no acute intracranial process.  - Exam today reveals 4/5 strength x 4. Awake and alert.  - Both his inpatient and outpatient neurologists have raised the concerns that some of his symptoms and exacerbations may be more somatoform in etiology 2/2 his severe anxiety 2/2 PTSD may be playing a role. - He completed his 5 day course of IVIG on Tuesday (5/28) - Again extubated and off the ventilator.     Impression:  MG exacerbation with possible component of his presentation secondary to somatoform disorder/functional overlay   Recommendations: - Continue home prednisone - Continue Mestinon   - On Klonopin 0.25 mg BID for anxiety - Speech therapy for input on degree of pharyngeal weakness/swallowing function to determine if there is objective weakness of swallowing and clearing of secretions that may be leading to his recurrent bouts of respiratory distress.  - Daily FVC and NIFs with respiratory therapy.   - Medications that may worsen or trigger MG exacerbation: Class IA antiarrhythmics, magnesium, flouroquinolones, macrolides, aminoglycosides, penicillamine, curare, interferon alpha, botox, quinine. Use with caution: calcium channel blockers, beta blockers and statins. - Neurology will sign off and follow PRN.   LOS: 8 days    Pt seen by Neuro NP/APP and later by MD.  Lynnae January, DNP, AGACNP-BC [facility]  Electronically signed: Dr. Caryl Pina

## 2022-06-29 NOTE — Progress Notes (Addendum)
Pt able to produce a NIF of -38, VC 2.3 with great effort.

## 2022-06-29 NOTE — Progress Notes (Signed)
Nutrition Follow-up  DOCUMENTATION CODES:   Severe malnutrition in context of acute illness/injury  INTERVENTION:   Tube feeding via Cortrak tube: Vital AF 1.2 @ 55 ml/hr (1320 ml per day)  Provides 1584 kcal, 99 gm protein, 1070 ml free water daily   NUTRITION DIAGNOSIS:   Severe Malnutrition related to acute illness (MG flare) as evidenced by moderate fat depletion, moderate muscle depletion, percent weight loss. Ongoing.   GOAL:   Patient will meet greater than or equal to 90% of their needs Met with TF at goal  MONITOR:   Diet advancement, TF tolerance  REASON FOR ASSESSMENT:   Consult Enteral/tube feeding initiation and management  ASSESSMENT:   Pt with PMH of MG, DM, anxiety, and HTN admitted for MG flare.    Pt discussed during ICU rounds and with RN and MD.  Pt extubated this am    Spoke with pt's daughter and wife. They report pt has had weight loss over the last 5 weeks. Wife reports pt did not tell her but he had had trouble swallowing for the last 5 weeks.  Usual weight: 127 lb  Current weight: 115 lb  9% weight loss x 5 weeks  05/22 - admitted  05/24 - extubated; s/p cortrak placement; distal stomach per xray; later re-intubated 05/30 - extubated  Medications reviewed and include: colace, pepcid, SSI, miralax, prednisone Precedex Levophed @ 2 mcg  Labs reviewed:  Na 132 CBG's: 112-221     Diet Order:   Diet Order             Diet NPO time specified  Diet effective now                   EDUCATION NEEDS:   No education needs have been identified at this time  Skin:  Skin Assessment: Reviewed RN Assessment  Last BM:  5/24 large  Height:   Ht Readings from Last 1 Encounters:  06/21/22 5\' 5"  (1.651 m)    Weight:   Wt Readings from Last 1 Encounters:  06/29/22 55.2 kg   BMI:  Body mass index is 20.25 kg/m.  Estimated Nutritional Needs:   Kcal:  1500-1700  Protein:  80-100 grams  Fluid:  >1.7 L/day  Cammy Copa., RD, LDN, CNSC See AMiON for contact information

## 2022-06-29 NOTE — Progress Notes (Signed)
Pt extubated per Dr's order, pt is on 4L [place] and tolerating well. Prior to extubating cuff leak was heard. Pt able to speak his name post extubation, no stridor noted.

## 2022-06-30 DIAGNOSIS — J9601 Acute respiratory failure with hypoxia: Secondary | ICD-10-CM | POA: Diagnosis not present

## 2022-06-30 DIAGNOSIS — J9602 Acute respiratory failure with hypercapnia: Secondary | ICD-10-CM | POA: Diagnosis not present

## 2022-06-30 LAB — GLUCOSE, CAPILLARY
Glucose-Capillary: 168 mg/dL — ABNORMAL HIGH (ref 70–99)
Glucose-Capillary: 167 mg/dL — ABNORMAL HIGH (ref 70–99)
Glucose-Capillary: 172 mg/dL — ABNORMAL HIGH (ref 70–99)
Glucose-Capillary: 250 mg/dL — ABNORMAL HIGH (ref 70–99)
Glucose-Capillary: 214 mg/dL — ABNORMAL HIGH (ref 70–99)
Glucose-Capillary: 257 mg/dL — ABNORMAL HIGH (ref 70–99)

## 2022-06-30 MED ORDER — FLUOXETINE HCL 20 MG PO CAPS
20.0000 mg | ORAL_CAPSULE | Freq: Every day | ORAL | Status: DC
  Administered 2022-06-30 – 2022-07-04 (×5): 20 mg
  Filled 2022-06-30 (×5): qty 1

## 2022-06-30 MED ORDER — LORAZEPAM 2 MG/ML IJ SOLN
1.0000 mg | INTRAMUSCULAR | Status: DC | PRN
  Administered 2022-06-30 – 2022-07-06 (×21): 1 mg via INTRAVENOUS
  Filled 2022-06-30 (×21): qty 1

## 2022-06-30 MED ORDER — IPRATROPIUM-ALBUTEROL 0.5-2.5 (3) MG/3ML IN SOLN
RESPIRATORY_TRACT | Status: AC
  Administered 2022-06-30: 3 mL
  Filled 2022-06-30: qty 3

## 2022-06-30 MED ORDER — DILTIAZEM HCL 30 MG PO TABS
30.0000 mg | ORAL_TABLET | Freq: Four times a day (QID) | ORAL | Status: DC
  Administered 2022-06-30 – 2022-07-01 (×4): 30 mg
  Filled 2022-06-30 (×4): qty 1

## 2022-06-30 MED ORDER — CLONAZEPAM 0.25 MG PO TBDP
0.5000 mg | ORAL_TABLET | Freq: Two times a day (BID) | ORAL | Status: DC
  Administered 2022-06-30 – 2022-07-05 (×11): 0.5 mg
  Filled 2022-06-30 (×11): qty 2

## 2022-06-30 MED ORDER — IPRATROPIUM-ALBUTEROL 0.5-2.5 (3) MG/3ML IN SOLN: 3.0000 mL | Freq: Once | RESPIRATORY_TRACT | Status: AC

## 2022-06-30 NOTE — Procedures (Signed)
Objective Swallowing Evaluation: Type of Study: FEES-Fiberoptic Endoscopic Evaluation of Swallow   Patient Details  Name: [name] MRN: [id number] Date of Birth: [birth year]  Today's Date: 06/30/2022 Time: SLP Start Time (ACUTE ONLY): 1000 -SLP Stop Time (ACUTE ONLY): 1040  SLP Time Calculation (min) (ACUTE ONLY): 40 min   Past Medical History:  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 He improved on IVIG and was discharged to home on 5/20, at which point his respiratory function was vastly improved although he did have some residual ptosis. He then re-presented on 5/22, 3 days after discharge, with respiratory distress and NIF -19. He was reintubated in the ED due to concerns for increasing hypercarbia on his blood gases. He was started on a repeat course of IVIG after weighing IVIG versus PLEX.  5/22 admit, intubated, 5/23 given Mg for replenishment, which exacerbated his MG symptoms. Extubation postponed. 5/24 pt extubate, reintubated. NIFs were -10 to -15 before reintubation. 5/27 extubated and reintubated 9h later due to ineffective cough. 5/28 MRI negative for stroke. Extubated 5/30   No data recorded   Recommendations for follow up therapy are one component of a multi-disciplinary discharge planning process, led by  the attending physician.  Recommendations may be updated based on patient status, additional functional criteria and insurance authorization.  Assessment / Plan / Recommendation     06/30/2022   10:00 AM  Clinical Impressions  Clinical Impression Pt presents with a moderate pharyngeal dysphagia with primary problem of  genaralized pharyngeal weakness. Weakness appears more consistent with deconditioning associated with prolonged NPO status and acute illness than focal bulbar impariment, though this cannot be ruled out. Pt does have symmetrical ROM and can initiate the swallow mechanism. Pt can achieve glottic closure for phonation. Small bilateral excresence observed on vocal folds from ET tube contact. There are dried secretions in sulci but glottis is clear. Pt does not appear to have recently been aspirating secretions. When given ice chips and teaspoons of nectar thick juice pt has consistent sensation of trace penetration from diffuse pharyngeal residue and loosened secretions. Pt protected airway throughout with small throat clearing. No aspiration occured. Recommend pt initaite ice chips to rehydrate pharyngeal mucosa and start using the swallow mechanism. SLP will plan to repeat FEES or MBS next week to determine tolerance for next steps. Prognosis for return to swallowing good.  SLP Visit Diagnosis Dysphagia, oropharyngeal phase (R13.12)  Impact on safety and function Moderate aspiration risk;Risk for inadequate nutrition/hydration         06/30/2022   10:00 AM  Treatment Recommendations  Treatment Recommendations F/U FEES in ___ days (Comment);Therapy as outlined in treatment plan below        06/30/2022   10:00 AM  Prognosis  Prognosis for improved oropharyngeal function Good  06/30/2022   10:00 AM  Diet Recommendations  SLP Diet Recommendations Ice chips PRN after oral care;Alternative means - temporary  Medication Administration Via alternative means         06/30/2022    10:00 AM  Other Recommendations  Follow Up Recommendations Acute inpatient rehab (3hours/day)  Functional Status Assessment Patient has had a recent decline in their functional status and demonstrates the ability to make significant improvements in function in a reasonable and predictable amount of time.       06/30/2022   10:00 AM  Frequency and Duration   Speech Therapy Frequency (ACUTE ONLY) min 2x/week  Treatment Duration 2 weeks         06/30/2022   10:00 AM  Oral Phase  Oral Phase [facility]       06/30/2022   10:00 AM  Pharyngeal Phase  Pharyngeal Phase Impaired  Pharyngeal- Nectar Teaspoon Reduced laryngeal elevation;Reduced anterior laryngeal mobility;Reduced pharyngeal peristalsis;Reduced tongue base retraction;Reduced epiglottic inversion;Penetration/Apiration after swallow;Pharyngeal residue - valleculae;Pharyngeal residue - pyriform  Pharyngeal Material enters airway, remains ABOVE vocal cords then ejected out  Pharyngeal- Thin Teaspoon Reduced laryngeal elevation;Reduced anterior laryngeal mobility;Reduced pharyngeal peristalsis;Reduced tongue base retraction;Reduced epiglottic inversion;Penetration/Apiration after swallow;Pharyngeal residue - valleculae;Pharyngeal residue - pyriform  Pharyngeal Material enters airway, remains ABOVE vocal cords then ejected out;Material does not enter airway         No data to display           Rush Salce, [name] 06/30/2022, 11:23 AM

## 2022-06-30 NOTE — Progress Notes (Signed)
Patient complaining of shortness of breath with increased work of breathing. Patient given PRN duoneb with improvement per patient.

## 2022-06-30 NOTE — Progress Notes (Signed)
CPT not done due to high blood pressure and increased anxiety. Treatment will resume at 0800am. Rt will continue to monitor.

## 2022-06-30 NOTE — Progress Notes (Signed)
eLink Physician-Brief Progress Note Patient Name: [name] DOB: February 28, 1955 MRN: [id number]   Date of Service  06/30/2022  HPI/Events of Note  RT reports episode of poor air movement with tachypnea.  Requesting as needed DuoNebs.  eICU Interventions  Camera exam done.  Patient resting comfortably.  1 dose of DuoNebs ordered.     Intervention Category Evaluation Type: Other  Carilyn Goodpasture 06/30/2022, 4:49 AM

## 2022-06-30 NOTE — Progress Notes (Signed)
Occupational Therapy Treatment Patient Details Name: [name] MRN: [id number] DOB: 1955/05/01 Today's Date: 06/30/2022   History of present illness Pt is a 67 y.o. M who presents 06/16/2022 with complaints of myasthenia gravis flare; over the past 4 weeks he has been having worsening ptosis in the right eye, right face and arm weakness, difficulty swallowing, difficulty breathing. extubated and re-intubated 06/26/22 Significant PMH: myasthenia gravis, hepatitis B, T2DM, HTN.   OT comments  Pt agreeable to OT/PT session.  He is anxious today, now extubated on 1L O2.  SpO2 maintained during session, but RR ranging from 19-35 improving with sitting EOB and/or cueing for PLB techniques.  He requires mod-max assist +2 for bed mobility, min assist +2 for transfers.  Able to engage in Adls only when supported sitting in bed due to decreased activity tolerance.  Completing grooming with setup assist and donning lotion to BLEs with min assist.  Will follow acutely, continue per plan for >3hrs/day inpatient setting.    Recommendations for follow up therapy are one component of a multi-disciplinary discharge planning process, led by the attending physician.  Recommendations may be updated based on patient status, additional functional criteria and insurance authorization.    Assistance Recommended at Discharge Frequent or constant Supervision/Assistance  Patient can return home with the following  Assistance with cooking/housework;Direct supervision/assist for medications management;Direct supervision/assist for financial management;Assist for transportation;Help with stairs or ramp for entrance;A lot of help with bathing/dressing/bathroom;A lot of help with walking and/or transfers   Equipment Recommendations  Other (comment) (defer)    Recommendations for Other Services      Precautions / Restrictions Precautions Precautions: Fall Precaution Comments: watch RR and BP Restrictions Weight Bearing  Restrictions: No       Mobility Bed Mobility Overal bed mobility: Needs Assistance Bed Mobility: Supine to Sit, Sit to Supine     Supine to sit: Mod assist, +2 for safety/equipment Sit to supine: Max assist, +2 for physical assistance, +2 for safety/equipment   General bed mobility comments: trunk and LB support to EOB, returning to bed with incraesed assist due to fatigue    Transfers Overall transfer level: Needs assistance Equipment used: 2 person hand held assist Transfers: Sit to/from Stand Sit to Stand: Min assist, +2 physical assistance, +2 safety/equipment           General transfer comment: from EOB with bil handheld support, stepping to [facility] but fatiguing easily     Balance Overall balance assessment: Needs assistance Sitting-balance support: No upper extremity supported, Feet supported Sitting balance-Leahy Scale: Fair Sitting balance - Comments: min guard to min assist at EOB   Standing balance support: Bilateral upper extremity supported, During functional activity Standing balance-Leahy Scale: Poor Standing balance comment: relies on BUE support                           ADL either performed or assessed with clinical judgement   ADL Overall ADL's : Needs assistance/impaired     Grooming: Bed level;Wash/dry face Grooming Details (indicate cue type and reason): upright chair position in bed, pt washing face with setup given increased time     Lower Body Bathing: Minimal assistance;Bed level;Sitting/lateral leans Lower Body Bathing Details (indicate cue type and reason): upright in bed, applying lotion to BLEs with increased time     Lower Body Dressing: Maximal assistance;Sit to/from stand;+2 for physical assistance;+2 for safety/equipment   Toilet Transfer: Minimal assistance;+2 for physical assistance;+2 for safety/equipment Toilet Transfer  Details (indicate cue type and reason): simulated side stepping to [facility]         Functional  mobility during ADLs: Minimal assistance;Moderate assistance;+2 for physical assistance;+2 for safety/equipment General ADL Comments: pt limited by anxiety, cueing for PLB to decrease RR    Extremity/Trunk Assessment              Vision       Perception     Praxis      Cognition Arousal/Alertness: Awake/alert Behavior During Therapy: Anxious, Flat affect Overall Cognitive Status: Within Functional Limits for tasks assessed                                 General Comments: pt following simple commands with increased time, limited verbalizations and anxious thorughout session        Exercises      Shoulder Instructions       General Comments pt on [place] at 1L with Spo2 >94% throughout session, RR ranging from 19-35 (improved at EOB vs upright in bed) with cueing for PLB    Pertinent Vitals/ Pain       Pain Assessment Pain Assessment: Faces Faces Pain Scale: No hurt Pain Intervention(s): Monitored during session  Home Living                                          Prior Functioning/Environment              Frequency  Min 2X/week        Progress Toward Goals  OT Goals(current goals can now be found in the care plan section)  Progress towards OT goals: Progressing toward goals (slowly)  Acute Rehab OT Goals Patient Stated Goal: none stated Time For Goal Achievement: 07/10/22 Potential to Achieve Goals: Fair  Plan Discharge plan remains appropriate;Frequency remains appropriate    Co-evaluation    PT/OT/SLP Co-Evaluation/Treatment: Yes Reason for Co-Treatment: Complexity of the patient's impairments (multi-system involvement);Necessary to address cognition/behavior during functional activity;For patient/therapist safety;To address functional/ADL transfers PT goals addressed during session: Mobility/safety with mobility OT goals addressed during session: ADL's and self-care      AM-PAC OT "6 Clicks" Daily Activity      Outcome Measure   Help from another person eating meals?: Total (NPO) Help from another person taking care of personal grooming?: A Little Help from another person toileting, which includes using toliet, bedpan, or urinal?: A Lot Help from another person bathing (including washing, rinsing, drying)?: A Lot Help from another person to put on and taking off regular upper body clothing?: A Lot Help from another person to put on and taking off regular lower body clothing?: A Lot 6 Click Score: 12    End of Session Equipment Utilized During Treatment: Oxygen (1L)  OT Visit Diagnosis: Unsteadiness on feet (R26.81);Other abnormalities of gait and mobility (R26.89);Muscle weakness (generalized) (M62.81)   Activity Tolerance Patient limited by fatigue   Patient Left in bed;with call bell/phone within reach;with bed alarm set;with nursing/sitter in room   Nurse Communication Mobility status        Time: 1610-9604 OT Time Calculation (min): 26 min  Charges: OT General Charges $OT Visit: 1 Visit OT Treatments $Self Care/Home Management : 8-22 mins  Barry Brunner, OT Acute Rehabilitation Services Office [phone]   Chancy Milroy 06/30/2022, 12:21 PM

## 2022-06-30 NOTE — Progress Notes (Signed)
Physical Therapy Treatment Patient Details Name: [name] MRN: [id number] DOB: Jul 16, 1955 Today's Date: 06/30/2022   History of Present Illness Pt is a 67 y.o. M who presents 06/16/2022 with complaints of myasthenia gravis flare; over the past 4 weeks he has been having worsening ptosis in the right eye, right face and arm weakness, difficulty swallowing, difficulty breathing. extubated and re-intubated 06/26/22 Significant PMH: myasthenia gravis, hepatitis B, T2DM, HTN.    PT Comments    Patient with limited activity tolerance now extubated on 1L O2 with RR up to 30's with mobility and needing increased assist with all mobility more due to focused on his breathing.  Patient able to complete simple ADL in bed with OT and side steps to [facility] with PT and OT assisting today.  Family still at the bedside and supportive.  He may need inpatient rehab stay prior to d/c home.  PT will continue to follow.    Recommendations for follow up therapy are one component of a multi-disciplinary discharge planning process, led by the attending physician.  Recommendations may be updated based on patient status, additional functional criteria and insurance authorization.  Follow Up Recommendations       Assistance Recommended at Discharge Intermittent Supervision/Assistance  Patient can return home with the following A little help with walking and/or transfers;Help with stairs or ramp for entrance;A little help with bathing/dressing/bathroom   Equipment Recommendations  Other (comment) (TBA pending progress)    Recommendations for Other Services       Precautions / Restrictions Precautions Precautions: Fall Precaution Comments: watch RR and BP     Mobility  Bed Mobility Overal bed mobility: Needs Assistance Bed Mobility: Supine to Sit, Sit to Supine     Supine to sit: Mod assist, +2 for safety/equipment Sit to supine: Max assist, +2 for physical assistance, +2 for safety/equipment   General  bed mobility comments: trunk and LB support to EOB, returning to bed with increased assist due to fatigue    Transfers Overall transfer level: Needs assistance Equipment used: 2 person hand held assist Transfers: Sit to/from Stand Sit to Stand: Min assist, +2 physical assistance, +2 safety/equipment           General transfer comment: from EOB with bil handheld support, stepping to [facility] with assist for balance and lines    Ambulation/Gait                   Stairs             Wheelchair Mobility    Modified Rankin (Stroke Patients Only)       Balance Overall balance assessment: Needs assistance Sitting-balance support: No upper extremity supported, Feet unsupported, Bilateral upper extremity supported Sitting balance-Leahy Scale: Poor Sitting balance - Comments: UE reliant and accepting assist today for balance as focus on breathing while on 1L O2.   Standing balance support: Bilateral upper extremity supported, During functional activity Standing balance-Leahy Scale: Poor Standing balance comment: relies on BUE support                            Cognition Arousal/Alertness: Awake/alert Behavior During Therapy: Anxious Overall Cognitive Status: Difficult to assess                                 General Comments: pt following simple commands with increased time, limited verbalizations and anxious throughout session  Exercises      General Comments General comments (skin integrity, edema, etc.): on O2 via [place] 1LPM with SpO2 >94% throughout session wit RR 19-35 and cues for PLB due to short shallow breaths; daughter and wife in the room      Pertinent Vitals/Pain Pain Assessment Pain Assessment: Faces Faces Pain Scale: No hurt    Home Living                          Prior Function            PT Goals (current goals can now be found in the care plan section) Progress towards PT goals: Progressing  toward goals (slow)    Frequency    Min 3X/week      PT Plan Current plan remains appropriate    Co-evaluation PT/OT/SLP Co-Evaluation/Treatment: Yes Reason for Co-Treatment: Complexity of the patient's impairments (multi-system involvement);Necessary to address cognition/behavior during functional activity;For patient/therapist safety;To address functional/ADL transfers PT goals addressed during session: Mobility/safety with mobility        AM-PAC PT "6 Clicks" Mobility   Outcome Measure  Help needed turning from your back to your side while in a flat bed without using bedrails?: A Lot Help needed moving from lying on your back to sitting on the side of a flat bed without using bedrails?: A Lot Help needed moving to and from a bed to a chair (including a wheelchair)?: A Lot Help needed standing up from a chair using your arms (e.g., wheelchair or bedside chair)?: A Lot Help needed to walk in hospital room?: Total Help needed climbing 3-5 steps with a railing? : Total 6 Click Score: 10    End of Session Equipment Utilized During Treatment: Gait belt;Oxygen Activity Tolerance: Patient limited by fatigue Patient left: in chair;with call bell/phone within reach;with family/visitor present Nurse Communication: Mobility status PT Visit Diagnosis: Muscle weakness (generalized) (M62.81);Other symptoms and signs involving the nervous system (R29.898)     Time: 9528-4132 PT Time Calculation (min) (ACUTE ONLY): 26 min  Charges:  $Therapeutic Activity: 8-22 mins                     Sheran Lawless, PT Acute Rehabilitation Services Office:[phone] 06/30/2022    Elray Mcgregor 06/30/2022, 4:38 PM

## 2022-07-01 LAB — GLUCOSE, CAPILLARY
Glucose-Capillary: 142 mg/dL — ABNORMAL HIGH (ref 70–99)
Glucose-Capillary: 201 mg/dL — ABNORMAL HIGH (ref 70–99)
Glucose-Capillary: 208 mg/dL — ABNORMAL HIGH (ref 70–99)
Glucose-Capillary: 213 mg/dL — ABNORMAL HIGH (ref 70–99)
Glucose-Capillary: 225 mg/dL — ABNORMAL HIGH (ref 70–99)
Glucose-Capillary: 276 mg/dL — ABNORMAL HIGH (ref 70–99)

## 2022-07-01 LAB — COMPREHENSIVE METABOLIC PANEL
ALT: 35 U/L (ref 0–44)
AST: 38 U/L (ref 15–41)
Albumin: 2.6 g/dL — ABNORMAL LOW (ref 3.5–5.0)
Alkaline Phosphatase: 73 U/L (ref 38–126)
Anion gap: 9 (ref 5–15)
BUN: 31 mg/dL — ABNORMAL HIGH (ref 8–23)
CO2: 25 mmol/L (ref 22–32)
Calcium: 9.1 mg/dL (ref 8.9–10.3)
Chloride: 102 mmol/L (ref 98–111)
Creatinine, Ser: 0.84 mg/dL (ref 0.61–1.24)
GFR, Estimated: >60 mL/min (ref >60)
Glucose, Bld: 248 mg/dL — ABNORMAL HIGH (ref 70–99)
Potassium: 3.8 mmol/L (ref 3.5–5.1)
Sodium: 136 mmol/L (ref 135–145)
Total Bilirubin: 0.4 mg/dL (ref 0.3–1.2)
Total Protein: 8.1 g/dL (ref 6.5–8.1)

## 2022-07-01 LAB — CBC WITH DIFFERENTIAL/PLATELET
Abs Immature Granulocytes: 0.29 10*3/uL — ABNORMAL HIGH (ref 0.00–0.07)
Basophils Absolute: 0.1 10*3/uL (ref 0.0–0.1)
Basophils Relative: 0 %
Eosinophils Absolute: 0 10*3/uL (ref 0.0–0.5)
Eosinophils Relative: 0 %
HCT: 31.3 % — ABNORMAL LOW (ref 39.0–52.0)
Hemoglobin: 10.5 g/dL — ABNORMAL LOW (ref 13.0–17.0)
Immature Granulocytes: 2 %
Lymphocytes Relative: 26 %
Lymphs Abs: 3.5 10*3/uL (ref 0.7–4.0)
MCH: 28.6 pg (ref 26.0–34.0)
MCHC: 33.5 g/dL (ref 30.0–36.0)
MCV: 85.3 fL (ref 80.0–100.0)
Monocytes Absolute: 1.1 10*3/uL — ABNORMAL HIGH (ref 0.1–1.0)
Monocytes Relative: 8 %
Neutro Abs: 8.3 10*3/uL — ABNORMAL HIGH (ref 1.7–7.7)
Neutrophils Relative %: 64 %
Platelets: 380 10*3/uL (ref 150–400)
RBC: 3.67 MIL/uL — ABNORMAL LOW (ref 4.22–5.81)
RDW: 14.8 % (ref 11.5–15.5)
WBC: 13.2 10*3/uL — ABNORMAL HIGH (ref 4.0–10.5)
nRBC: 0.4 % — ABNORMAL HIGH (ref 0.0–0.2)

## 2022-07-01 LAB — POCT I-STAT 7, (LYTES, BLD GAS, ICA,H+H)
Acid-Base Excess: 3 mmol/L — ABNORMAL HIGH (ref 0.0–2.0)
Bicarbonate: 30.1 mmol/L — ABNORMAL HIGH (ref 20.0–28.0)
Calcium, Ion: 1.3 mmol/L (ref 1.15–1.40)
HCT: 33 % — ABNORMAL LOW (ref 39.0–52.0)
Hemoglobin: 11.2 g/dL — ABNORMAL LOW (ref 13.0–17.0)
O2 Saturation: 99 %
Patient temperature: 98.5
Potassium: 3.8 mmol/L (ref 3.5–5.1)
Sodium: 139 mmol/L (ref 135–145)
TCO2: 32 mmol/L (ref 22–32)
pCO2 arterial: 56.6 mmHg — ABNORMAL HIGH (ref 32–48)
pH, Arterial: 7.334 — ABNORMAL LOW (ref 7.35–7.45)
pO2, Arterial: 150 mmHg — ABNORMAL HIGH (ref 83–108)

## 2022-07-01 MED ORDER — DILTIAZEM HCL 60 MG PO TABS
60.0000 mg | ORAL_TABLET | Freq: Four times a day (QID) | ORAL | Status: DC
  Administered 2022-07-01 – 2022-07-04 (×12): 60 mg
  Filled 2022-07-01 (×12): qty 1

## 2022-07-01 NOTE — Progress Notes (Signed)
   NAME:  [name], MRN:  [id number], DOB:  [birth year], LOS: 10 ADMISSION DATE:  06/21/2022, CONSULTATION DATE:  5/22 REFERRING MD:  Karene Fry, CHIEF COMPLAINT:  SOB   History of Present Illness:  67 year old man w/ hx of MG and recent admit for flare presenting to ER for worsening SOB.  Low NIF in ER with extreme tachypnea so intubated for airway protection.  No infectious prodrome, seems to have started just after discharged 2 days ago.  History per wife at bedside.  Neurology is following.  Pertinent  Medical History  MG DM2 Anxiety HTN Significant Hospital Events: Including procedures, antibiotic start and stop dates in addition to other pertinent events   5/22 admit 5/23 given Mg for replenishment, which exacerbated his MG symptoms. Extubation postponed. 5/24 pt extubate, reintubated. NIFs were -10 to -15 before reintubation. 5/27 extubated and reintubated 9h later due to ineffective cough.  5/28 MRI negative for stroke.   Interim History / Subjective:   Continues to have episodes of tachycardiac, hypertension and tachypnea, but no desaturation. Complains of profound fatigue but was able to get up to chair with assistance.   Objective   Blood pressure 124/72, pulse (!) 109, temperature 98.5 F (36.9 C), temperature source Axillary, resp. rate (!) 30, height 5\' 5"  (1.651 m), weight 53.8 kg, SpO2 96 %.        Intake/Output Summary (Last 24 hours) at 07/01/2022 1229 Last data filed at 07/01/2022 0900 Gross per 24 hour  Intake 1299.9 ml  Output 1900 ml  Net -600.1 ml    Filed Weights   06/26/22 0453 06/29/22 0500 07/01/22 0709  Weight: 56 kg 55.2 kg 53.8 kg    Examination: General - Slender build HEENT - orally intubated, nasogastric tube in place. Resp - clear Abdo - soft Neuro - awake and able to follow commands, strength 4+/5.  Voice aphonic Ext:- area of induration right forearm with no redness.   Ancillary tests personally reviewed:   Na: 132 MRI brain negative  for stroke. Staph aureus in tracheal aspirate.   Assessment & Plan:  # Acute hypoxemic and hypercarbic respiratory failure due to recurrent MG crisis with unclear trigger.  Now required 2nd reintubation.  Patient has refused tracheostomy and PICC placement.  Anxiety/PTSD component as well.  -continue tube feeds  -On home MG medications -Has completed course of IVIG -continue clonazepam for anxiety - Doing well with good objective measures of strength. Still needs ++ rehab and encouragement.   # sinus tachycardia/hypertension due to exertion/anxiety - Controlled with IV diltiazem  - Convert to oral diltiazem  # DM2 without hyperglycemia -Sliding Scale insulin  Best Practice (right click and "Reselect all SmartList Selections" daily)   Diet/type: NPO - continue tube feeds.  SLP following.  DVT prophylaxis: prophylactic heparin  GI prophylaxis: PPI Lines: N/A Foley:  Yes, and it is still needed Code Status:  full code Last date of multidisciplinary goals of care discussion [wife updated at bedside]  Lynnell Catalan, MD [facility] ICU Physician [facility] [place] Critical Care  Pager: [phone] Mobile: [phone] After hours: [phone].

## 2022-07-01 NOTE — Progress Notes (Signed)
   NAME:  [name], MRN:  [id number], DOB:  [birth year], LOS: 10 ADMISSION DATE:  06/21/2022, CONSULTATION DATE:  5/22 REFERRING MD:  Karene Fry, CHIEF COMPLAINT:  SOB   History of Present Illness:  67 year old man w/ hx of MG and recent admit for flare presenting to ER for worsening SOB.  Low NIF in ER with extreme tachypnea so intubated for airway protection.  No infectious prodrome, seems to have started just after discharged 2 days ago.  History per wife at bedside.  Neurology is following.  Pertinent  Medical History  MG DM2 Anxiety HTN Significant Hospital Events: Including procedures, antibiotic start and stop dates in addition to other pertinent events   5/22 admit 5/23 given Mg for replenishment, which exacerbated his MG symptoms. Extubation postponed. 5/24 pt extubate, reintubated. NIFs were -10 to -15 before reintubation. 5/27 extubated and reintubated 9h later due to ineffective cough.  5/28 MRI negative for stroke.   Interim History / Subjective:  Extubated yesterday. Continues to have episodes of tachycardiac, hypertension and tachypnea, but no desaturation. Complains of profound fatigue but was able to get up to chair with assistance.   Objective   Blood pressure 124/72, pulse (!) 109, temperature 98.5 F (36.9 C), temperature source Axillary, resp. rate (!) 30, height 5\' 5"  (1.651 m), weight 55.2 kg, SpO2 96 %.        Intake/Output Summary (Last 24 hours) at 07/01/2022 1152 Last data filed at 07/01/2022 0900 Gross per 24 hour  Intake 1419.84 ml  Output 1900 ml  Net -480.16 ml    Filed Weights   06/25/22 0500 06/26/22 0453 06/29/22 0500  Weight: 56 kg 56 kg 55.2 kg    Examination: General - Slender build HEENT - orally intubated, nasogastric tube in place. Resp - clear Abdo - soft Neuro - awake and able to follow commands, strength 4+/5.  Voice aphonic Ext:- area of induration right forearm with no redness.   Ancillary tests personally reviewed:   Na:  132 MRI brain negative for stroke.  Assessment & Plan:  # Acute hypoxemic and hypercarbic respiratory failure due to recurrent MG crisis with unclear trigger.  Now required 2nd reintubation.  Patient has refused tracheostomy and PICC placement.  Anxiety/PTSD component as well.  -continue tube feeds  -On home MG medications -Has completed course of IVIG -continue clonazepam for anxiety - complete 5 days of antibiotics for aspiration.  - Low dose Precedex for comfort.  - Doing well with good objective measures of strength. Still needs ++ rehab and encouragement.   # sinus tachycardia/hypertension due to exertion/anxiety - Controlled with IV diltiazem  - Convert to oral diltiazem  # DM2 without hyperglycemia -Sliding Scale insulin  Best Practice (right click and "Reselect all SmartList Selections" daily)   Diet/type: NPO - continue tube feeds DVT prophylaxis: prophylactic heparin  GI prophylaxis: PPI Lines: N/A Foley:  Yes, and it is still needed Code Status:  full code Last date of multidisciplinary goals of care discussion [wife updated at bedside]  Lynnell Catalan, MD [facility] ICU Physician [facility] [place] Critical Care  Pager: [phone] Mobile: [phone] After hours: [phone].

## 2022-07-01 NOTE — Progress Notes (Addendum)
eLink Physician-Brief Progress Note Patient Name: [name] DOB: [birth year] MRN: [id number]   Date of Service  07/01/2022  HPI/Events of Note  Camera alert: Discussed with RN. More confused from before. Was on NRB yesterday transitioned to nasal o2. Sinus tachy 120's. MAP good. Has NG tube. Follows simple commands. Extubated on 30 th.  Data: Last pco2 was 51 from 28 th.   AHHRF from MG crisis.s/p IV IG, PLEX .  Neuro on board. AMS, r/o co2 narcosis. Or could be just from PTSD-somatoform disorder per neurologist's note.   Black stools   eICU Interventions  Stat ABG and hg/hct. Call back when gets resulted CMP, CBC for AM ordered Asp precautions Mostly will need BiPAP  or re intubation.     Intervention Category Intermediate Interventions: Other:;Change in mental status - evaluation and management  Ranee Gosselin 07/01/2022, 12:48 AM  01:19  ABG pco2 56, was around same on 27 th. Getting ativan , received 2 hrs ago. HR now 116 per RN discussion. Would watch for now.  Avoiding bipap due to aspiration risk?Marland Kitchen

## 2022-07-02 DIAGNOSIS — J9601 Acute respiratory failure with hypoxia: Secondary | ICD-10-CM | POA: Diagnosis not present

## 2022-07-02 DIAGNOSIS — J9602 Acute respiratory failure with hypercapnia: Secondary | ICD-10-CM | POA: Diagnosis not present

## 2022-07-02 LAB — GLUCOSE, CAPILLARY
Glucose-Capillary: 191 mg/dL — ABNORMAL HIGH (ref 70–99)
Glucose-Capillary: 205 mg/dL — ABNORMAL HIGH (ref 70–99)
Glucose-Capillary: 151 mg/dL — ABNORMAL HIGH (ref 70–99)
Glucose-Capillary: 218 mg/dL — ABNORMAL HIGH (ref 70–99)
Glucose-Capillary: 180 mg/dL — ABNORMAL HIGH (ref 70–99)
Glucose-Capillary: 217 mg/dL — ABNORMAL HIGH (ref 70–99)

## 2022-07-02 MED ORDER — ORAL CARE MOUTH RINSE
15.0000 mL | OROMUCOSAL | Status: DC
  Administered 2022-07-02 – 2022-07-07 (×18): 15 mL via OROMUCOSAL

## 2022-07-02 MED ORDER — ORAL CARE MOUTH RINSE: 15.0000 mL | OROMUCOSAL | Status: DC | PRN

## 2022-07-02 MED ORDER — HALOPERIDOL 1 MG PO TABS
1.0000 mg | ORAL_TABLET | Freq: Four times a day (QID) | ORAL | Status: DC | PRN
  Administered 2022-07-07: 1 mg
  Filled 2022-07-02: qty 1

## 2022-07-02 NOTE — Progress Notes (Signed)
eLink Physician-Brief Progress Note Patient Name: [name] DOB: Jul 25, 1955 MRN: [id number]   Date of Service  07/02/2022  HPI/Events of Note  67 year old male with a history of myasthenia gravis that presented with the placement of crisis with acute respiratory failure and was intubated and extubated and unfortunately had to be reintubated.  Currently extubated 5/30.  Has copious secretions, but also has significant anxiety specifically with CPT.  eICU Interventions  Change CPT every 4 hours while awake.     Intervention Category Minor Interventions: Routine modifications to care plan (e.g. PRN medications for pain, fever)  Dyamond Tolosa 07/02/2022, 3:58 AM

## 2022-07-02 NOTE — Progress Notes (Signed)
Patient currently resting, RT will check back with patient to continue CPT. RN informed.

## 2022-07-02 NOTE — Progress Notes (Signed)
Came by to assess patient since he moved to my area from ICU. Patient is very weak & unable to do Flutter valve with me. BS very diminished, but no Rhonchi heard. RR 40 with shallow breaths & SpO2-99%.

## 2022-07-02 NOTE — Plan of Care (Signed)
  Problem: Education: Goal: Knowledge of General Education information will improve Description: Including pain rating scale, medication(s)/side effects and non-pharmacologic comfort measures Outcome: Progressing   Problem: Health Behavior/Discharge Planning: Goal: Ability to manage health-related needs will improve Outcome: Progressing   Problem: Clinical Measurements: Goal: Ability to maintain clinical measurements within normal limits will improve Outcome: Progressing Goal: Cardiovascular complication will be avoided Outcome: Progressing   Problem: Activity: Goal: Risk for activity intolerance will decrease Outcome: Progressing   Problem: Nutrition: Goal: Adequate nutrition will be maintained Outcome: Progressing   Problem: Elimination: Goal: Will not experience complications related to bowel motility Outcome: Progressing Goal: Will not experience complications related to urinary retention Outcome: Progressing   Problem: Pain Managment: Goal: General experience of comfort will improve Outcome: Progressing

## 2022-07-02 NOTE — Progress Notes (Signed)
   NAME:  [name], MRN:  [id number], DOB:  1955/10/26, LOS: 11 ADMISSION DATE:  06/21/2022, CONSULTATION DATE:  5/22 REFERRING MD:  Karene Fry, CHIEF COMPLAINT:  SOB   History of Present Illness:  67 year old man w/ hx of MG and recent admit for flare presenting to ER for worsening SOB.  Low NIF in ER with extreme tachypnea so intubated for airway protection.  No infectious prodrome, seems to have started just after discharged 2 days ago.  History per wife at bedside.  Neurology is following.  Pertinent  Medical History  MG DM2 Anxiety HTN Significant Hospital Events: Including procedures, antibiotic start and stop dates in addition to other pertinent events   5/22 admit 5/23 given Mg for replenishment, which exacerbated his MG symptoms. Extubation postponed. 5/24 pt extubate, reintubated. NIFs were -10 to -15 before reintubation. 5/27 extubated and reintubated 9h later due to ineffective cough.  5/28 MRI negative for stroke.  5/30 extubated. 6/1 back on IV diltiazem for hypertension and tachycardia.  Gradually improving strength.  Interim History / Subjective:  Weaned off IV diltiazem.  Remains weak but improving confidence with fewer episodes of shortness of breath.  Objective   Blood pressure (!) 146/84, pulse (!) 112, temperature 98.3 F (36.8 C), temperature source Axillary, resp. rate (!) 34, height 5\' 5"  (1.651 m), weight 51.9 kg, SpO2 98 %.        Intake/Output Summary (Last 24 hours) at 07/02/2022 0904 Last data filed at 07/02/2022 0600 Gross per 24 hour  Intake 770.76 ml  Output 1125 ml  Net -354.24 ml    Filed Weights   06/29/22 0500 07/01/22 0709 07/02/22 0754  Weight: 55.2 kg 53.8 kg 51.9 kg    Examination: General - Slender build HEENT -extubated, nasogastric tube in place. Resp - clear Abdo - soft Neuro - awake and able to follow commands, strength 4+/5.  Voice aphonic Ext:- area of induration right forearm with no redness.  Improving  Ancillary tests  personally reviewed:   Na: 132 MRI brain negative for stroke. Staph aureus in tracheal aspirate.   Assessment & Plan:  Acute hypoxemic and hypercarbic respiratory failure due to recurrent MG crisis with unclear trigger.  Now required 2nd reintubation.  Patient has refused tracheostomy and PICC placement.  Anxiety/PTSD component as well.  -continue tube feeds  -On home MG medications -Has completed course of IVIG - Continue clonazepam for anxiety - Doing well with good objective measures of strength. Still needs ++ rehab and encouragement.  Weakness at this point is largely deconditioning.   -No further intervention planned for myasthenia at this point.  Neurology has signed off.  Sinus tachycardia/hypertension due to exertion/anxiety -Now well-controlled on oral diltiazem.  DM2 without hyperglycemia -Sliding Scale insulin  Best Practice (right click and "Reselect all SmartList Selections" daily)   Diet/type: NPO - continue tube feeds.  SLP following.  DVT prophylaxis: prophylactic heparin  GI prophylaxis: PPI Lines: N/A Foley:  Yes, and it is still needed Code Status:  full code Last date of multidisciplinary goals of care discussion [wife updated at bedside]  Lynnell Catalan, MD [facility] ICU Physician [facility]  Pager: [phone] Mobile: [phone] After hours: [phone].

## 2022-07-03 ENCOUNTER — Inpatient Hospital Stay (HOSPITAL_COMMUNITY): Payer: Medicare Other

## 2022-07-03 DIAGNOSIS — J988 Other specified respiratory disorders: Secondary | ICD-10-CM | POA: Diagnosis not present

## 2022-07-03 DIAGNOSIS — G7001 Myasthenia gravis with (acute) exacerbation: Secondary | ICD-10-CM | POA: Diagnosis not present

## 2022-07-03 LAB — GLUCOSE, CAPILLARY
Glucose-Capillary: 141 mg/dL — ABNORMAL HIGH (ref 70–99)
Glucose-Capillary: 147 mg/dL — ABNORMAL HIGH (ref 70–99)
Glucose-Capillary: 178 mg/dL — ABNORMAL HIGH (ref 70–99)
Glucose-Capillary: 210 mg/dL — ABNORMAL HIGH (ref 70–99)
Glucose-Capillary: 215 mg/dL — ABNORMAL HIGH (ref 70–99)
Glucose-Capillary: 218 mg/dL — ABNORMAL HIGH (ref 70–99)
Glucose-Capillary: 255 mg/dL — ABNORMAL HIGH (ref 70–99)

## 2022-07-03 NOTE — Progress Notes (Signed)
PROGRESS NOTE  [name] ZOX:[id number] DOB: 08-17-1955 DOA: 06/21/2022 PCP: [place], [name], MD  Hospital Course/Subjective: 67 year old man w/ hx of MG and recent admit for flare presenting to ER for worsening SOB. Low NIF in ER with extreme tachypnea so intubated for airway protection. No infectious prodrome, seems to have started just after last discharge 2 days prior. History per wife at bedside. Neurology was following has signed off. Was extubated and reintubated 5/24, now extubated and doing better since 5/30. Was on Cardizem drip due to tachycardia now controlled on PO Dilt. Completed IVIG and neurology has signed off. Feeling that remaining weakness now is from deconditioning.  Per son and RN has been trying to get out of bed and confused, but re-directable. Tube feeds continue, so far only PO is ice chips.  Assessment/Plan:  Acute hypoxemic and hypercarbic respiratory failure due to recurrent MG crisis with unclear trigger.  Now required 2nd reintubation.  Patient has refused tracheostomy and PICC placement.  Anxiety/PTSD component as well.  -continue tube feeds, SLP following  -On home MG medications -Has completed course of IVIG - Continue clonazepam for anxiety - Doing well with good objective measures of strength. Still needs rehab and encouragement.  Weakness at this point is largely deconditioning.   -No further intervention planned for myasthenia at this point.  Neurology has signed off.   Sinus tachycardia/hypertension due to exertion/anxiety -Now well-controlled on oral diltiazem.   DM2 without hyperglycemia -Sliding Scale insulin  DVT Prophylaxis: Hep SQ  Code Status: Full Code   Family Communication: Son at bedside   Disposition Plan: Working with PT/OT, disposition pending progress.   Anti-infectives (From admission, onward)    Start     Dose/Rate Route Frequency Ordered Stop   06/26/22 2330  Ampicillin-Sulbactam (UNASYN) 3 g in sodium chloride 0.9 %  100 mL IVPB  Status:  Discontinued        3 g 200 mL/hr over 30 Minutes Intravenous Every 6 hours 06/26/22 2235 06/28/22 1520       Objective: Vitals:   07/02/22 1919 07/02/22 2315 07/03/22 0427 07/03/22 0724  BP: (!) 137/90 128/83 (!) 135/92 117/69  Pulse: (!) 114 (!) 109 95 (!) 106  Resp: (!) 33 (!) 33 (!) 30 (!) 35  Temp: 98 F (36.7 C) 97.6 F (36.4 C) 97.6 F (36.4 C) 98.2 F (36.8 C)  TempSrc: Oral Axillary Axillary Axillary  SpO2: 96% 99% 94% 95%  Weight:   48.2 kg   Height:        Intake/Output Summary (Last 24 hours) at 07/03/2022 0803 Last data filed at 07/03/2022 0021 Gross per 24 hour  Intake 270 ml  Output 1100 ml  Net -830 ml   Filed Weights   07/02/22 0754 07/02/22 1146 07/03/22 0427  Weight: 51.9 kg 51 kg 48.2 kg   Exam: General:  Thin male lying in bed sleeping, NG tube in place. Eyes: EOMI, clear sclerea Neck: supple, no masses, trachea mildline  Cardiovascular: RRR, no murmurs or rubs, no peripheral edema  Respiratory: clear to auscultation bilaterally, no wheezes, no crackles  Abdomen: soft, nontender, nondistended, normal bowel tones heard  Skin: dry, no rashes  Musculoskeletal: no joint effusions, normal range of motion   Data Reviewed: CBC: Recent Labs  Lab 06/28/22 0117 07/01/22 0053 07/01/22 0201  WBC 6.9  --  13.2*  NEUTROABS  --   --  8.3*  HGB 9.3* 11.2* 10.5*  HCT 27.0* 33.0* 31.3*  MCV 82.1  --  85.3  PLT 197  --  380   Basic Metabolic Panel: Recent Labs  Lab 06/27/22 0602 06/28/22 0117 07/01/22 0053 07/01/22 0201  NA 131* 132* 139 136  K 4.5 3.7 3.8 3.8  CL 97* 102  --  102  CO2 20* 20*  --  25  GLUCOSE 191* 170*  --  248*  BUN 26* 28*  --  31*  CREATININE 1.11 0.97  --  0.84  CALCIUM 8.6* 8.4*  --  9.1   GFR: Estimated Creatinine Clearance: 59.8 mL/min (by C-G formula based on SCr of 0.84 mg/dL). Liver Function Tests: Recent Labs  Lab 07/01/22 0201  AST 38  ALT 35  ALKPHOS 73  BILITOT 0.4  PROT 8.1   ALBUMIN 2.6*   No results for input(s): "LIPASE", "AMYLASE" in the last 168 hours. No results for input(s): "AMMONIA" in the last 168 hours. Coagulation Profile: No results for input(s): "INR", "PROTIME" in the last 168 hours. Cardiac Enzymes: No results for input(s): "CKTOTAL", "CKMB", "CKMBINDEX", "TROPONINI" in the last 168 hours. BNP (last 3 results) No results for input(s): "PROBNP" in the last 8760 hours. HbA1C: No results for input(s): "HGBA1C" in the last 72 hours. CBG: Recent Labs  Lab 07/02/22 1537 07/02/22 1930 07/02/22 2316 07/03/22 0429 07/03/22 0727  GLUCAP 218* 180* 217* 210* 215*   Lipid Profile: No results for input(s): "CHOL", "HDL", "LDLCALC", "TRIG", "CHOLHDL", "LDLDIRECT" in the last 72 hours. Thyroid Function Tests: No results for input(s): "TSH", "T4TOTAL", "FREET4", "T3FREE", "THYROIDAB" in the last 72 hours. Anemia Panel: No results for input(s): "VITAMINB12", "FOLATE", "FERRITIN", "TIBC", "IRON", "RETICCTPCT" in the last 72 hours. Urine analysis:    Component Value Date/Time   COLORURINE YELLOW 06/24/2022 1106   APPEARANCEUR CLEAR 06/24/2022 1106   LABSPEC 1.014 06/24/2022 1106   PHURINE 6.0 06/24/2022 1106   GLUCOSEU >=500 (A) 06/24/2022 1106   GLUCOSEU NEGATIVE 12/25/2016 1056   HGBUR NEGATIVE 06/24/2022 1106   BILIRUBINUR NEGATIVE 06/24/2022 1106   KETONESUR 20 (A) 06/24/2022 1106   PROTEINUR 30 (A) 06/24/2022 1106   UROBILINOGEN 0.2 12/25/2016 1056   NITRITE NEGATIVE 06/24/2022 1106   LEUKOCYTESUR NEGATIVE 06/24/2022 1106   Sepsis Labs: @LABRCNTIP (procalcitonin:4,lacticidven:4)  ) Recent Results (from the past 240 hour(s))  Culture, blood (Routine X 2) w Reflex to ID Panel     Status: None   Collection Time: 06/24/22  4:06 AM   Specimen: BLOOD LEFT ARM  Result Value Ref Range Status   Specimen Description BLOOD LEFT ARM  Final   Special Requests   Final    BOTTLES DRAWN AEROBIC AND ANAEROBIC Blood Culture results may not be  optimal due to an excessive volume of blood received in culture bottles   Culture   Final    NO GROWTH 5 DAYS Performed at [facility] Lab, [address], [place], [place] [address]    Report Status 06/29/2022 FINAL  Final  Culture, blood (Routine X 2) w Reflex to ID Panel     Status: None   Collection Time: 06/24/22  4:06 AM   Specimen: BLOOD LEFT ARM  Result Value Ref Range Status   Specimen Description BLOOD LEFT ARM  Final   Special Requests   Final    BOTTLES DRAWN AEROBIC AND ANAEROBIC Blood Culture results may not be optimal due to an excessive volume of blood received in culture bottles   Culture   Final    NO GROWTH 5 DAYS Performed at [facility], [address] [place], [place] [address]    Report Status 06/29/2022 FINAL  Final  Culture, Respiratory w Gram Stain     Status: None   Collection Time: 06/24/22  4:31 AM   Specimen: Tracheal Aspirate; Respiratory  Result Value Ref Range Status   Specimen Description TRACHEAL ASPIRATE  Final   Special Requests NONE  Final   Gram Stain   Final    ABUNDANT WBC PRESENT,BOTH PMN AND MONONUCLEAR RARE GRAM POSITIVE COCCI IN PAIRS Performed at [facility], [address], [place], [place] [address]    Culture FEW STAPHYLOCOCCUS AUREUS  Final   Report Status 06/28/2022 FINAL  Final   Organism ID, Bacteria STAPHYLOCOCCUS AUREUS  Final      Susceptibility   Staphylococcus aureus - MIC*    CIPROFLOXACIN <=0.5 SENSITIVE Sensitive     ERYTHROMYCIN <=0.25 SENSITIVE Sensitive     GENTAMICIN <=0.5 SENSITIVE Sensitive     OXACILLIN 0.5 SENSITIVE Sensitive     TETRACYCLINE <=1 SENSITIVE Sensitive     VANCOMYCIN <=0.5 SENSITIVE Sensitive     TRIMETH/SULFA <=10 SENSITIVE Sensitive     CLINDAMYCIN <=0.25 SENSITIVE Sensitive     RIFAMPIN <=0.5 SENSITIVE Sensitive     Inducible Clindamycin NEGATIVE Sensitive     LINEZOLID 2 SENSITIVE Sensitive     * FEW STAPHYLOCOCCUS AUREUS  Respiratory (~20 pathogens) panel by PCR      Status: None   Collection Time: 06/24/22 11:06 AM   Specimen: Nasopharyngeal Swab; Respiratory  Result Value Ref Range Status   Adenovirus NOT DETECTED NOT DETECTED Final   Coronavirus 229E NOT DETECTED NOT DETECTED Final    Comment: (NOTE) The Coronavirus on the Respiratory Panel, DOES NOT test for the novel  Coronavirus (2019 nCoV)    Coronavirus HKU1 NOT DETECTED NOT DETECTED Final   Coronavirus NL63 NOT DETECTED NOT DETECTED Final   Coronavirus OC43 NOT DETECTED NOT DETECTED Final   Metapneumovirus NOT DETECTED NOT DETECTED Final   Rhinovirus / Enterovirus NOT DETECTED NOT DETECTED Final   Influenza A NOT DETECTED NOT DETECTED Final   Influenza B NOT DETECTED NOT DETECTED Final   Parainfluenza Virus 1 NOT DETECTED NOT DETECTED Final   Parainfluenza Virus 2 NOT DETECTED NOT DETECTED Final   Parainfluenza Virus 3 NOT DETECTED NOT DETECTED Final   Parainfluenza Virus 4 NOT DETECTED NOT DETECTED Final   Respiratory Syncytial Virus NOT DETECTED NOT DETECTED Final   Bordetella pertussis NOT DETECTED NOT DETECTED Final   Bordetella Parapertussis NOT DETECTED NOT DETECTED Final   Chlamydophila pneumoniae NOT DETECTED NOT DETECTED Final   Mycoplasma pneumoniae NOT DETECTED NOT DETECTED Final    Comment: Performed at [facility], [address] [place], [place] [address]  Culture, Respiratory w Gram Stain     Status: None   Collection Time: 06/26/22 10:22 PM   Specimen: Tracheal Aspirate; Respiratory  Result Value Ref Range Status   Specimen Description TRACHEAL ASPIRATE  Final   Special Requests NONE  Final   Gram Stain   Final    FEW WBC PRESENT,BOTH PMN AND MONONUCLEAR FEW GRAM POSITIVE COCCI IN PAIRS Performed at [facility], [address], [place], [place] [address]    Culture FEW STAPHYLOCOCCUS AUREUS  Final   Report Status 06/29/2022 FINAL  Final   Organism ID, Bacteria STAPHYLOCOCCUS AUREUS  Final      Susceptibility   Staphylococcus aureus - MIC*     CIPROFLOXACIN <=0.5 SENSITIVE Sensitive     ERYTHROMYCIN <=0.25 SENSITIVE  Sensitive     GENTAMICIN <=0.5 SENSITIVE Sensitive     OXACILLIN <=0.25 SENSITIVE Sensitive     TETRACYCLINE <=1 SENSITIVE Sensitive     VANCOMYCIN <=0.5 SENSITIVE Sensitive     TRIMETH/SULFA <=10 SENSITIVE Sensitive     CLINDAMYCIN <=0.25 SENSITIVE Sensitive     RIFAMPIN <=0.5 SENSITIVE Sensitive     Inducible Clindamycin NEGATIVE Sensitive     LINEZOLID 2 SENSITIVE Sensitive     * FEW STAPHYLOCOCCUS AUREUS     Studies: No results found.  Scheduled Meds:  Chlorhexidine Gluconate Cloth  6 each Topical Daily   clonazepam  0.5 mg Per Tube BID   diltiazem  60 mg Per Tube Q6H   FLUoxetine  20 mg Per Tube Daily   heparin  5,000 Units Subcutaneous Q8H   insulin aspart  0-20 Units Subcutaneous Q4H   mouth rinse  15 mL Mouth Rinse 4 times per day   predniSONE  10 mg Per Tube Q breakfast   pyridostigmine  60 mg Per Tube Q8H    Continuous Infusions:  sodium chloride Stopped (06/30/22 0905)   feeding supplement (VITAL AF 1.2 CAL) 1,000 mL (07/03/22 0618)     LOS: 12 days   Time spent: 31 minutes  Dearis Danis Sharlette Dense, MD [facility] Pager [phone]  If 7PM-7AM, please contact night-coverage [email or web address] Password [email or web address] 07/03/2022, 8:03 AM

## 2022-07-03 NOTE — Care Management Important Message (Signed)
Important Message  Patient Details  Name: [name] MRN: [id number] Date of Birth: [birth year]   Medicare Important Message Given:  Yes     [name] Stefan Church 07/03/2022, 3:25 PM

## 2022-07-03 NOTE — Progress Notes (Signed)
Speech Language Pathology Treatment: Dysphagia  Patient Details Name: [name] MRN: [id number] DOB: 02-25-1955 Today's Date: 07/03/2022 Time: 6295-2841 SLP Time Calculation (min) (ACUTE ONLY): 8 min  Assessment / Plan / Recommendation Clinical Impression  Pt had gotten himself up the chair while unsupervised. Cortrak appears still in place, but was pulling nose from across the room. RN reports she has been giving ice chips and pt doing well with this. Pt appears physically stronger than Friday, but cognition obviously impacting safety. His cough is present after sips of water and still quite soft. Will proceed with MBS today as pt demonstrates potential for diet initiation. FEES not available until tomorrow so will change plan to MBS to expedite testing.   HPI HPI: 66/M, hx seropositive myasthenia gravis recently admitted for exacerbation. He improved on IVIG and was discharged to home on 5/20, at which point his respiratory function was vastly improved although he did have some residual ptosis. He then re-presented on 5/22, 3 days after discharge, with respiratory distress and NIF -19. He was reintubated in the ED due to concerns for increasing hypercarbia on his blood gases. He was started on a repeat course of IVIG after weighing IVIG versus PLEX.  5/22 admit, intubated, 5/23 given Mg for replenishment, which exacerbated his MG symptoms. Extubation postponed. 5/24 pt extubate, reintubated. NIFs were -10 to -15 before reintubation. 5/27 extubated and reintubated 9h later due to ineffective cough. 5/28 MRI negative for stroke. Extubated 5/30      SLP Plan  MBS      Recommendations for follow up therapy are one component of a multi-disciplinary discharge planning process, led by the attending physician.  Recommendations may be updated based on patient status, additional functional criteria and insurance authorization.    Recommendations                                 MBS      Morayma Godown, [name]  07/03/2022, 9:43 AM

## 2022-07-03 NOTE — Progress Notes (Signed)
Physical Therapy Treatment Patient Details Name: [name] MRN: [id number] DOB: October 20, 1955 Today's Date: 07/03/2022   History of Present Illness Pt is a 67 y.o. M who presents 06/16/2022 with complaints of myasthenia gravis flare; over the past 4 weeks he has been having worsening ptosis in the right eye, right face and arm weakness, difficulty swallowing, difficulty breathing. extubated and re-intubated 06/26/22 Significant PMH: myasthenia gravis, hepatitis B, T2DM, HTN.    PT Comments    RN requesting assist getting pt back to bed from recliner. Pt had transferred himself to recliner without assist from staff, despite profound weakness. Found by RN with Cortrack tubing pulling on his nose and dislodged from pump. Pt with increased fatigue from sitting up in recliner. Requires total A for squat pivot transfer and return to supine. Pt placed in Posey belt per RN request due to decreased cognition and decreased safety with transfers due to weakness and fatigue. D/c plans remain appropriate.     Recommendations for follow up therapy are one component of a multi-disciplinary discharge planning process, led by the attending physician.  Recommendations may be updated based on patient status, additional functional criteria and insurance authorization.     Assistance Recommended at Discharge Intermittent Supervision/Assistance  Patient can return home with the following A little help with walking and/or transfers;Help with stairs or ramp for entrance;A little help with bathing/dressing/bathroom   Equipment Recommendations       Recommendations for Other Services Rehab consult     Precautions / Restrictions Precautions Precautions: Fall Precaution Comments: watch RR and BP Restrictions Weight Bearing Restrictions: No     Mobility  Bed Mobility Overal bed mobility: Needs Assistance Bed Mobility: Sit to Supine       Sit to supine: Total assist   General bed mobility comments:  requires trunk and LE support to return to bed    Transfers Overall transfer level: Needs assistance Equipment used: 1 person hand held assist Transfers: Bed to chair/wheelchair/BSC       Squat pivot transfers: Total assist     General transfer comment: recliner to bed, increased fatigue necessitating total A for pivoting from chair back to bed    Ambulation/Gait               General Gait Details: unable       Balance Overall balance assessment: Needs assistance Sitting-balance support: No upper extremity supported Sitting balance-Leahy Scale: Zero Sitting balance - Comments: requires increased assistance to sit at edge of recliner to pivot back to bed                                    Cognition Arousal/Alertness: Lethargic Behavior During Therapy: Flat affect Overall Cognitive Status: Difficult to assess                                 General Comments: pt is very lethargic, does not verbalize during session, able to follow commands that are not limited by increased fatigue           General Comments General comments (skin integrity, edema, etc.): per RN pt had transferred himself to recliner without assistance and nearly pulling his Cortrac out      Pertinent Vitals/Pain Pain Assessment Pain Assessment: Faces Faces Pain Scale: Hurts a little bit     PT Goals (current goals can now be  found in the care plan section) Acute Rehab PT Goals PT Goal Formulation: With patient/family Time For Goal Achievement: 07/10/22 Potential to Achieve Goals: Good Progress towards PT goals: Not progressing toward goals - comment    Frequency    Min 3X/week      PT Plan Current plan remains appropriate    Co-evaluation PT/OT/SLP Co-Evaluation/Treatment: Yes Reason for Co-Treatment: Complexity of the patient's impairments (multi-system involvement);Necessary to address cognition/behavior during functional activity;For  patient/therapist safety;To address functional/ADL transfers PT goals addressed during session: Mobility/safety with mobility        AM-PAC PT "6 Clicks" Mobility   Outcome Measure  Help needed turning from your back to your side while in a flat bed without using bedrails?: Total Help needed moving from lying on your back to sitting on the side of a flat bed without using bedrails?: Total Help needed moving to and from a bed to a chair (including a wheelchair)?: Total Help needed standing up from a chair using your arms (e.g., wheelchair or bedside chair)?: Total Help needed to walk in hospital room?: Total Help needed climbing 3-5 steps with a railing? : Total 6 Click Score: 6    End of Session   Activity Tolerance: Patient limited by fatigue Patient left: in bed;with call bell/phone within reach;with bed alarm set;Other (comment) (posey belt in place due to getting out of bed without assist)   PT Visit Diagnosis: Muscle weakness (generalized) (M62.81);Other symptoms and signs involving the nervous system (Z61.096)     Time: 1131-1140 PT Time Calculation (min) (ACUTE ONLY): 9 min  Charges:  $Therapeutic Activity: 8-22 mins                     Matthew Pilar B. Beverely Risen PT, DPT Acute Rehabilitation Services Please use secure chat or  Call Office [phone]    Elon Alas [facility] 07/03/2022, 5:09 PM

## 2022-07-03 NOTE — Progress Notes (Signed)
Inpatient Rehab Admissions Coordinator:  ? ?Per therapy recommendations,  patient was screened for CIR candidacy by Daysi Boggan, MS, CCC-SLP. At this time, Pt. Appears to be a a potential candidate for CIR. I will place   order for rehab consult per protocol for full assessment. Please contact me any with questions. ? ?Oneida Mckamey, MS, CCC-SLP ?Rehab Admissions Coordinator  ?[phone] (celll) ?[phone] (office) ? ?

## 2022-07-03 NOTE — Progress Notes (Signed)
SLP Cancellation Note  Patient Details Name: [name] MRN: [id number] DOB: 06-21-1955   Cancelled treatment:        Pt too fatigued now for success with MBS. Will reschedule when appropriate   Nicklas Mcsweeney, [name] 07/03/2022, 12:11 PM

## 2022-07-04 ENCOUNTER — Inpatient Hospital Stay (HOSPITAL_COMMUNITY): Payer: Medicare Other

## 2022-07-04 DIAGNOSIS — G7001 Myasthenia gravis with (acute) exacerbation: Secondary | ICD-10-CM | POA: Diagnosis not present

## 2022-07-04 LAB — CBC
HCT: 29.3 % — ABNORMAL LOW (ref 39.0–52.0)
Hemoglobin: 9.5 g/dL — ABNORMAL LOW (ref 13.0–17.0)
MCH: 28.7 pg (ref 26.0–34.0)
MCHC: 32.4 g/dL (ref 30.0–36.0)
MCV: 88.5 fL (ref 80.0–100.0)
Platelets: 396 10*3/uL (ref 150–400)
RBC: 3.31 MIL/uL — ABNORMAL LOW (ref 4.22–5.81)
RDW: 15.3 % (ref 11.5–15.5)
WBC: 11.8 10*3/uL — ABNORMAL HIGH (ref 4.0–10.5)
nRBC: 1.1 % — ABNORMAL HIGH (ref 0.0–0.2)

## 2022-07-04 LAB — BRAIN NATRIURETIC PEPTIDE: B Natriuretic Peptide: 33.2 pg/mL (ref 0.0–100.0)

## 2022-07-04 LAB — TSH: TSH: 0.411 u[IU]/mL (ref 0.350–4.500)

## 2022-07-04 LAB — BASIC METABOLIC PANEL
Anion gap: 10 (ref 5–15)
BUN: 41 mg/dL — ABNORMAL HIGH (ref 8–23)
CO2: 30 mmol/L (ref 22–32)
Calcium: 9.7 mg/dL (ref 8.9–10.3)
Chloride: 103 mmol/L (ref 98–111)
Creatinine, Ser: 1 mg/dL (ref 0.61–1.24)
GFR, Estimated: >60 mL/min (ref >60)
Glucose, Bld: 214 mg/dL — ABNORMAL HIGH (ref 70–99)
Potassium: 3.4 mmol/L — ABNORMAL LOW (ref 3.5–5.1)
Sodium: 143 mmol/L (ref 135–145)

## 2022-07-04 LAB — GLUCOSE, CAPILLARY
Glucose-Capillary: 211 mg/dL — ABNORMAL HIGH (ref 70–99)
Glucose-Capillary: 204 mg/dL — ABNORMAL HIGH (ref 70–99)
Glucose-Capillary: 206 mg/dL — ABNORMAL HIGH (ref 70–99)
Glucose-Capillary: 179 mg/dL — ABNORMAL HIGH (ref 70–99)
Glucose-Capillary: 177 mg/dL — ABNORMAL HIGH (ref 70–99)

## 2022-07-04 LAB — PROCALCITONIN: Procalcitonin: 0.58 ng/mL

## 2022-07-04 LAB — MAGNESIUM: Magnesium: 2.5 mg/dL — ABNORMAL HIGH (ref 1.7–2.4)

## 2022-07-04 LAB — C-REACTIVE PROTEIN: CRP: 0.6 mg/dL (ref <1.0)

## 2022-07-04 MED ORDER — VITAMIN D 25 MCG (1000 UNIT) PO TABS
2000.0000 [IU] | ORAL_TABLET | Freq: Every day | ORAL | Status: DC
  Administered 2022-07-04 – 2022-07-07 (×4): 2000 [IU] via ORAL
  Filled 2022-07-04 (×4): qty 2

## 2022-07-04 MED ORDER — PANTOPRAZOLE SODIUM 40 MG PO TBEC
40.0000 mg | DELAYED_RELEASE_TABLET | Freq: Every day | ORAL | Status: DC
  Administered 2022-07-04: 40 mg via ORAL
  Filled 2022-07-04: qty 1

## 2022-07-04 MED ORDER — POTASSIUM CHLORIDE 20 MEQ PO PACK
40.0000 meq | PACK | Freq: Once | ORAL | Status: AC
  Administered 2022-07-04: 40 meq via ORAL
  Filled 2022-07-04: qty 2

## 2022-07-04 MED ORDER — EMTRICITABINE-TENOFOVIR AF 200-25 MG PO TABS
1.0000 | ORAL_TABLET | Freq: Every day | ORAL | Status: DC
  Administered 2022-07-04 – 2022-07-07 (×4): 1 via ORAL
  Filled 2022-07-04 (×4): qty 1

## 2022-07-04 MED ORDER — ENTECAVIR 1 MG PO TABS: 1.0000 mg | ORAL_TABLET | Freq: Every day | ORAL | Status: DC

## 2022-07-04 NOTE — Progress Notes (Signed)
Modified Barium Swallow Study  Patient Details  Name: [name] MRN: [id number] Date of Birth: 04/09/1955  Today's Date: 07/04/2022  Modified Barium Swallow completed.  Full report located under Chart Review in the Imaging Section.  History of Present Illness 66/M, hx seropositive myasthenia gravis recently admitted for exacerbation. He improved on IVIG and was discharged to home on 5/20, at which point his respiratory function was vastly improved although he did have some residual ptosis. He then re-presented on 5/22, 3 days after discharge, with respiratory distress and NIF -19. He was reintubated in the ED due to concerns for increasing hypercarbia on his blood gases. He was started on a repeat course of IVIG after weighing IVIG versus PLEX.  5/22 admit, intubated, 5/23 given Mg for replenishment, which exacerbated his MG symptoms. Extubation postponed. 5/24 pt extubate, reintubated. NIFs were -10 to -15 before reintubation. 5/27 extubated and reintubated 9h later due to ineffective cough. 5/28 MRI negative for stroke. Extubated 5/30   Clinical Impression Pt presents with a mild to moderate oropharyngeal dysphagia with sluggish and repetitive lingual function resulting in prolonged oral phase and mild lingual and floor of mouth residue post swallow. Timing of swallowing initiation relatively good; though there are instances of thin liquids arriving just barely to pyriforms prior to hyoid burst. The primary pharyngeal impairment is mild weakness resulting in decreased epiglottic deflection; pt appears to not be able to overcome presence of NG tube. This leads to moderate vallecular and high lateral channel residue, more with solids and nectar thick liquids than thin liquids. Pt is sensate to one very trace instance of frank penetration. Protected airway throughout.  He does appear to have ongoing generalized weakness and waxing and waning function. Needed quite a bit of stimulation to  participate fully today. Pt can initiate thin liquids and mechanical soft solids, following bites with sips. Can attempt meds orally with thin liquids. Would not remove NG tube yet until he's consistently accepting PO. Ok for family to bring food from home. Would benefit from up to chair for meals or chair position in bed. Factors that may increase risk of adverse event in presence of aspiration [name] 2021): Weak cough;Inadequate oral hygiene;Frail or deconditioned;Limited mobility;Reduced cognitive function;Poor general health and/or compromised immunity  Swallow Evaluation Recommendations Liquid Administration via: Cup;Straw Medication Administration: Whole meds with liquid Supervision: Staff to assist with self-feeding;Full assist for feeding Swallowing strategies  : Slow rate;Small bites/sips;Follow solids with liquids Postural changes: Position pt fully upright for meals;Stay upright 30-60 min after meals      [name], [name] 07/04/2022,10:18 AM

## 2022-07-04 NOTE — Progress Notes (Signed)
NIF > -40 VC 1.86 L   Pt performed with great effort.

## 2022-07-04 NOTE — PMR Pre-admission (Signed)
PMR Admission Coordinator Pre-Admission Assessment  Patient: [name] is an 67 y.o., male MRN: [id number] DOB: 06/15/1955 Height: 5\' 5"  (165.1 cm) Weight: 48.2 kg  Insurance Information HMO:     PPO:      PCP:      IPA:      80/20: yes     OTHER:  PRIMARY: Medicare A and B       Policy#: [id number] Subscriber: Pt. Phone#: Verified online    Fax#:  Pre-Cert#:       Employer:  Benefits:  Phone #:      Name:  Eff. Date: Parts A effective  03/30/2004 and B effective 01/30/2005  Deduct: $1632      Out of Pocket Max:  None      Life Max: N/A  CIR: 100%      SNF: 100 days Outpatient: 80%     Co-Pay: 20% Home Health58: 100%      Co-Pay: none DME: 80%     Co-Pay: 20% Providers: patient's choice SECONDARY:       Policy#:      Phone#:   Artist:       Phone#:   The Engineer, materials Information Summary" for patients in Inpatient Rehabilitation Facilities with attached "Privacy Act Statement-Health Care Records" was provided and verbally reviewed with: Patient and Family  Emergency Contact Information Contact Information     Name Relation Home Work Mobile   Ouellet,Sopheap Spouse [phone]  [phone]   Derosa,Chris Son   [phone]       Current Medical History  Patient Admitting Diagnosis: Myesthenia Gravis crisis, Rsiratory failure.  History of Present Illness: [name], a 67 y/o with history of myasthenia gravis, hepatitis B, T2DM, hypertension presented to [facility] ED 06/21/22 with with extreme tachypnea  Intubated on arrival.  Pt. With recent admit 5/17-5/22 for MG flare.  Pt. Had onset of  MG 2003 s/p thymectomy for malignant thymoma. Treated with progressive doses of prednisone through  2016. He had a plasma exchange in 2017 with good results. In 2018 with recurrent symptoms prednisone was gradually increased to 30 mg daily by 2019. In 2019 he had infusion of IVIG which helped ameliorate his symptoms of swallow difficulty and SOB. He saw his  neurologist at [facility] for same on 06/08/22 and they suspected that the patients PTSD and anxiety was a component of his recurrent symptoms.  Pt. Was extubated and reintubated 06/23/22. Declined trach and PICC placement. Pt. With altered mental status but MRI 06/27/22 negative for stroke. Pt extubated to room air 5/30. Pt. Very weak following extubation, with coretrak for nutrition. Pt. Seen by PT/OT/SLP and they recommend CIR to assist return to PLOF.   Patient's medical record from [name]  has been reviewed by the rehabilitation admission coordinator and physician.  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 No  Family History   family history is not on file.  Current Medications  Current Facility-Administered Medications:    0.9 %  sodium chloride infusion, 250 mL, Intravenous, Continuous, Coralyn Helling, MD, Stopped at 06/30/22 0905   acetaminophen (TYLENOL) 160 MG/5ML solution 650 mg, 650 mg, Per Tube, Q6H PRN, Omar Person, MD, 650 mg at 07/03/22 2103   cholecalciferol (VITAMIN D3) 25 MCG (1000 UNIT) tablet 2,000 Units, 2,000 Units, Oral, Daily, Leroy Sea, MD, 2,000 Units at 07/04/22 1203   clonazePAM (KLONOPIN) disintegrating tablet 0.5 mg, 0.5 mg, Per Tube, BID, Agarwala, Ravi, MD, 0.5 mg at 07/04/22 1015   emtricitabine-tenofovir AF (DESCOVY) 200-25 MG per tablet 1 tablet, 1 tablet, Oral, Daily,  Pham, Minh Q, RPH-CPP, 1 tablet at 07/04/22 1203   feeding supplement (VITAL AF 1.2 CAL) liquid 1,000 mL, 1,000 mL, Per Tube, Continuous, Agarwala, Ravi, MD, Last Rate: 55 mL/hr at 07/04/22 0507, 1,000 mL at 07/04/22 0507   FLUoxetine (PROZAC) capsule 20 mg, 20 mg, Per Tube, Daily, Agarwala, Ravi, MD, 20 mg at 07/04/22 1015   haloperidol (HALDOL) tablet 1 mg, 1 mg, Per Tube, Q6H PRN, Lynnell Catalan, MD   heparin injection 5,000 Units, 5,000 Units, Subcutaneous, Q8H, Reese, Stephanie M, PA-C, 5,000 Units at 07/04/22 0507   hydrALAZINE (APRESOLINE) injection 10 mg, 10 mg, Intravenous, Q6H PRN, Harris, Whitney D, NP, 10 mg at 06/30/22 1146   insulin aspart (novoLOG) injection 0-20 Units, 0-20 Units, Subcutaneous, Q4H, Omar Person, MD, 7 Units at 07/04/22 1202   LORazepam (ATIVAN) injection 1 mg, 1 mg, Intravenous, Q4H PRN, Agarwala, Ravi, MD, 1 mg at 07/04/22 0541   Oral care mouth rinse, 15 mL, Mouth Rinse, 4 times per day, Lynnell Catalan, MD, 15 mL at 07/04/22 1203   Oral care mouth rinse, 15 mL, Mouth Rinse, PRN, Agarwala, Ravi, MD   pantoprazole (PROTONIX) EC tablet 40 mg, 40 mg, Oral, Daily, Susa Raring K, MD, 40 mg at 07/04/22 1203   predniSONE (DELTASONE) tablet 10 mg, 10 mg, Per Tube, Q breakfast, Omar Person, MD, 10 mg at 07/04/22 1015   pyridostigmine (MESTINON) tablet 60 mg, 60 mg, Per Tube, Q8H, Agarwala, Daleen Bo, MD, 60 mg at 07/04/22 1203  Patients Current Diet:  Diet Order             DIET DYS 3 Room service appropriate? No; Fluid consistency: Thin  Diet effective now                   Precautions / Restrictions Precautions Precautions: Fall Precaution Comments: watch RR and BP Restrictions Weight Bearing Restrictions: No   Has the patient had 2 or more falls or a fall with injury in the past year? No  Prior Activity Level Community (5-7x/wk): pt. active in the community PTA  Prior Functional Level Self Care: Did the patient need help bathing,  dressing, using the toilet or eating? Independent  Indoor Mobility: Did the patient need assistance with walking from room to room (with or without device)? Independent  Stairs: Did the patient need assistance with internal or external stairs (with or without device)? Independent  Functional Cognition: Did the patient need help planning regular tasks such as shopping or remembering to take medications? Independent  Patient Information Are you of Hispanic, Latino/a,or Spanish origin?: A. No, not of Hispanic, Latino/a, or Spanish origin (proxy) What is your race?: J. Other Asian Do you need or want an interpreter to communicate with a doctor or health care staff?: 0. No  Patient's Response To:  Health Literacy and Transportation Is the patient able to respond to health literacy and transportation needs?: No (proxy) Health Literacy - How often do you need to have someone help you when you read instructions, pamphlets, or other written material from your doctor or pharmacy?: Patient unable to respond In the past 12 months, has lack of transportation kept you from medical appointments or from getting medications?: No In the past 12 months, has lack of transportation kept you from meetings, work, or from getting things needed for daily living?: No  Journalist, newspaper / Equipment Home Assistive Devices/Equipment: None Home Equipment: None  Prior Device Use: Indicate devices/aids used by the patient prior to current illness, exacerbation or injury? None of the above  Current Functional Level Cognition  Overall Cognitive Status: Difficult to assess Difficult to assess due to: Intubated Orientation Level: Oriented X4 General Comments: pt is very lethargic, does not verbalize during session, able to follow commands that are not limited by increased fatigue    Extremity Assessment (includes Sensation/Coordination)  Upper Extremity Assessment: Generalized weakness RUE Deficits / Details:  very close to full strength LUE Deficits / Details: very close to full strength  Lower Extremity Assessment: Defer to PT evaluation    ADLs  Overall ADL's : Needs assistance/impaired Eating/Feeding: NPO Grooming: Bed level, Wash/dry face Grooming Details (indicate cue type and reason): upright chair position in bed, pt washing face with setup given increased time Upper Body Bathing: Minimal assistance, Sitting Lower Body Bathing: Minimal assistance, Bed level, Sitting/lateral leans Lower Body Bathing Details (indicate cue type and reason): upright in bed, applying lotion to BLEs with increased time Upper Body Dressing : Minimal assistance, Sitting Upper Body Dressing Details (indicate cue type and reason): new gown Lower Body Dressing: Maximal assistance, Sit to/from stand, +2 for physical assistance, +2 for safety/equipment Toilet Transfer: Minimal assistance, +2 for physical assistance, +2 for safety/equipment Toilet Transfer Details (indicate cue type and reason): simulated side stepping to [facility] Toileting- Clothing Manipulation and Hygiene: Total assistance, Bed level Toileting - Clothing Manipulation Details (indicate cue type and reason): Pt with condom cath Functional mobility during ADLs: Minimal assistance, Moderate assistance, +2 for physical assistance, +2 for safety/equipment General ADL Comments: pt limited by anxiety, cueing for PLB to decrease RR    Mobility  Overal bed mobility: Needs Assistance Bed Mobility: Sit to Supine Supine to sit: Mod assist, +2 for safety/equipment Sit to supine: Total assist General bed mobility comments: requires trunk and LE support to return to bed    Transfers  Overall transfer level: Needs assistance Equipment used: 1 person hand held assist Transfers: Bed to chair/wheelchair/BSC Sit to Stand: Min assist, +2 physical assistance, +2 safety/equipment Bed to/from chair/wheelchair/BSC transfer type:: Squat pivot Squat pivot transfers: Total  assist Step pivot transfers: Min assist, +2 safety/equipment General transfer comment: recliner to bed, increased fatigue necessitating total A for pivoting from chair back to bed    Ambulation / Gait / Stairs / Wheelchair Mobility  Ambulation/Gait Ambulation/Gait assistance: Min assist, +2 safety/equipment (+3 for vent and chair follow) Gait Distance (Feet): 50 Feet Assistive device: Rolling walker (2 wheels) Gait Pattern/deviations: Step-through pattern, Decreased stride length General Gait Details: unable    Posture / Balance Dynamic Sitting Balance Sitting balance - Comments: requires increased assistance to sit at edge of recliner to pivot back to bed Balance Overall balance assessment: Needs assistance Sitting-balance support: No upper extremity supported Sitting balance-Leahy Scale: Zero Sitting balance - Comments: requires increased assistance to  sit at edge of recliner to pivot back to bed Standing balance support: Bilateral upper extremity supported, During functional activity Standing balance-Leahy Scale: Poor Standing balance comment: relies on BUE support    Special needs/care consideration Skin intact and Special service needs none    Previous Home Environment (from acute therapy documentation) Living Arrangements: Spouse/significant other  Lives With: Spouse Available Help at Discharge: Family Type of Home: House Home Layout: One level Home Access: Level entry Entrance Stairs-Rails: None Entrance Stairs-Number of Steps: 0 Bathroom Shower/Tub: Associate Professor: Yes How Accessible: Accessible via walker Home Care Services: No  Discharge Living Setting Plans for Discharge Living Setting: House Type of Home at Discharge: House Discharge Home Layout: One level Discharge Home Access: Stairs to enter Entrance Stairs-Rails: None Entrance Stairs-Number of Steps: 6 Discharge Bathroom Shower/Tub: Tub/shower  unit Discharge Bathroom Toilet: Standard Discharge Bathroom Accessibility: Yes How Accessible: Accessible via walker Does the patient have any problems obtaining your medications?: No  Social/Family/Support Systems Patient Roles: Spouse Contact Information: [phone] Anticipated Caregiver: Laird Weinheimer Caregiver Availability: 24/7 Discharge Plan Discussed with Primary Caregiver: Yes Is Caregiver In Agreement with Plan?: No Does Caregiver/Family have Issues with Lodging/Transportation while Pt is in Rehab?: No  Goals Patient/Family Goal for Rehab: PT/OT/SLP Min A Expected length of stay: 18-21 days Pt/Family Agrees to Admission and willing to participate: Yes Program Orientation Provided & Reviewed with Pt/Caregiver Including Roles  & Responsibilities: Yes  Decrease burden of Care through IP rehab admission: not anticipated  Possible need for SNF placement upon discharge: not anticipated  Patient Condition: I have reviewed medical records from [facility] , spoken with CM, and patient and spouse. I met with patient at the bedside for inpatient rehabilitation assessment.  Patient will benefit from ongoing PT, OT, and SLP, can actively participate in 3 hours of therapy a day 5 days of the week, and can make measurable gains during the admission.  Patient will also benefit from the coordinated team approach during an Inpatient Acute Rehabilitation admission.  The patient will receive intensive therapy as well as Rehabilitation physician, nursing, social worker, and care management interventions.  Due to bladder management, bowel management, safety, skin/wound care, disease management, medication administration, pain management, and patient education the patient requires 24 hour a day rehabilitation nursing.  The patient is currently min A- min G  with mobility and basic ADLs.  Discharge setting and therapy post discharge at home with home health is anticipated.  Patient has  agreed to participate in the Acute Inpatient Rehabilitation Program and will admit today.  Preadmission Screen Completed By:  Jeronimo Greaves, 07/04/2022 2:33 PM ______________________________________________________________________   Discussed status with Dr. Shearon Stalls on 07/07/22 at 1014 and received approval for admission today.  Admission Coordinator:  Jeronimo Greaves, CCC-SLP, time 6295 /Date 07/07/22   Assessment/Plan: Diagnosis: Does the need for close, 24 hr/day Medical supervision in concert with the patient's rehab needs make it unreasonable for this patient to be served in a less intensive setting? Yes Co-Morbidities requiring supervision/potential complications: Mysesthenia gravis crisis s/p IVIG, respiratory failure, dysphagia s/p cortrak, hypertension, tachycardia, anxiety, insomnia, Dm2, anemia, and anxiety Due to bladder management, bowel management, safety, skin/wound care, disease management, medication administration, pain management, and patient education, does the patient require 24 hr/day rehab nursing? Yes Does the patient require coordinated care of a physician, rehab nurse, PT, OT, and SLP to address physical and functional deficits in the context of the above medical diagnosis(es)? Yes  Addressing deficits in the following areas: balance, endurance, locomotion, strength, transferring, bowel/bladder control, bathing, dressing, feeding, grooming, toileting, cognition, swallowing, and psychosocial support Can the patient actively participate in an intensive therapy program of at least 3 hrs of therapy 5 days a week? Yes The potential for patient to make measurable gains while on inpatient rehab is good Anticipated functional outcomes upon discharge from inpatient rehab: min assist PT, min assist OT, min assist SLP Estimated rehab length of stay to reach the above functional goals is: 18-21 days Anticipated discharge destination: Home 10. Overall Rehab/Functional Prognosis: good   MD  Signature:  Angelina Sheriff, DO 07/07/2022

## 2022-07-04 NOTE — Progress Notes (Signed)
Pt with a hx of hep B who is one entecavir. Due to unavailability here we will use [facility] to bridge while he is here. Dr Thedore Mins is aware.   Ulyses Southward, PharmD, BCIDP, AAHIVP, CPP Infectious Disease Pharmacist 07/04/2022 10:50 AM

## 2022-07-04 NOTE — Consult Note (Signed)
   [facility] CM Inpatient Consult   07/04/2022  [name] 07/09/1955 [id number]  [facility] [[facility]]  Accountable Care Organization [ACO] Patient: Medicare ACO REACH  Primary Care Provider:  [place], [name], MD With [place] at [facility]  Patient screened for hospitalization with noted medium risk score for unplanned readmission risk 12 day length of stay [includes an ICU stay] and  to assess for potential [facility]  [[facility]] Care Management service needs for post hospital transition for care coordination.  Review of patient's electronic medical record reveals patient transferred from ICU and is for potential inpatient rehabilitation following for progress for transition verses SNF.  Review as patient is requiring max A [total] per Rehab Admission Coordinators notes.  Plan:  Continue to follow progress and disposition to assess for post hospital community care coordination/management needs.  Referral request for community care coordination:  following  Of note, [facility] Care Management/Population Health does not replace or interfere with any arrangements made by the Inpatient Transition of Care team.  For questions contact:   Charlesetta Shanks, RN BSN CCM [facility]  [phone] business mobile phone Toll free office [phone]  *Concierge Line  [phone] Fax number: [phone] [email or web address]

## 2022-07-04 NOTE — Inpatient Diabetes Management (Addendum)
Inpatient Diabetes Program Recommendations  AACE/ADA: New Consensus Statement on Inpatient Glycemic Control (2015)  Target Ranges:  Prepandial:   less than 140 mg/dL      Peak postprandial:   less than 180 mg/dL (1-2 hours)      Critically ill patients:  140 - 180 mg/dL   Lab Results  Component Value Date   GLUCAP 206 (H) 07/04/2022   HGBA1C 5.7 (H) 06/16/2022    Review of Glycemic Control  Latest Reference Range & Units 07/03/22 17:27 07/03/22 20:37 07/03/22 23:44 07/04/22 04:21 07/04/22 08:22 07/04/22 11:47  Glucose-Capillary 70 - 99 mg/dL 161 (H) 096 (H) 045 (H) 211 (H) 204 (H) 206 (H)   Diabetes history: DM  Outpatient Diabetes medications:  Metformin 1000 mg bid Prednisone 10 mg daily Januvia 100 mg daily Current orders for Inpatient glycemic control:  Novolog 0-20 units q 4 hours Vital 55 ml/hr Prednisone 10 mg daily  Inpatient Diabetes Program Recommendations:    May consider reducing Novolog correction moderate q 4 hours and add Novolog tube feed coverage 2 units q 4 hours (if feeds held, please hold tube feed coverage).   Thanks,  Beryl Meager, RN, BC-ADM Inpatient Diabetes Coordinator Pager [phone]  (8a-5p)

## 2022-07-04 NOTE — Progress Notes (Signed)
Occupational Therapy Treatment Patient Details Name: [name] MRN: [id number] DOB: [birth year] Today's Date: 07/04/2022   History of present illness Pt is a 67 y.o. M who presents 06/16/2022 with complaints of myasthenia gravis flare; over the past 4 weeks he has been having worsening ptosis in the right eye, right face and arm weakness, difficulty swallowing, difficulty breathing. extubated and re-intubated 06/26/22 Significant PMH: myasthenia gravis, hepatitis B, T2DM, HTN.   OT comments  Pt progressing toward established OT goals this session. Focus session on strengthen and optimizing activity tolerance. Pt performing step pivot transfer to and from [facility] this session with mod A progressing to min A with RW. Upon standing from [facility] immediately requesting back to bed, but able to stand for pericare ~1 minute and then able to sit EOB an additional ~3 mins for BP readings. Performing oral care at bed level this session due to fatigue. Pleasant and motivated throughout.   Recommendations for follow up therapy are one component of a multi-disciplinary discharge planning process, led by the attending physician.  Recommendations may be updated based on patient status, additional functional criteria and insurance authorization.    Assistance Recommended at Discharge Frequent or constant Supervision/Assistance  Patient can return home with the following  Assistance with cooking/housework;Direct supervision/assist for medications management;Direct supervision/assist for financial management;Assist for transportation;Help with stairs or ramp for entrance;A lot of help with bathing/dressing/bathroom;A lot of help with walking and/or transfers   Equipment Recommendations  Other (comment) (defer)    Recommendations for Other Services      Precautions / Restrictions Precautions Precautions: Fall Precaution Comments: watch RR and BP Restrictions Weight Bearing Restrictions: No       Mobility Bed  Mobility Overal bed mobility: Needs Assistance Bed Mobility: Supine to Sit, Sit to Supine     Supine to sit: Min assist Sit to supine: Mod assist   General bed mobility comments: Min HHA, increased time and 1-2 cues to achieve EOB. Requiring LE support to return to supine.    Transfers Overall transfer level: Needs assistance Equipment used: 1 person hand held assist Transfers: Sit to/from Stand, Bed to chair/wheelchair/BSC Sit to Stand: Mod assist, +2 safety/equipment     Step pivot transfers: Min assist, +2 physical assistance     General transfer comment: Increased fatigue with standing for pericare hunched posture requiring cues for upright positioning. Min A back to bed with RW.     Balance Overall balance assessment: Needs assistance Sitting-balance support: No upper extremity supported Sitting balance-Leahy Scale: Poor Sitting balance - Comments: min A initially at EOB progressing to min guard A.   Standing balance support: Bilateral upper extremity supported, During functional activity Standing balance-Leahy Scale: Poor Standing balance comment: relies on BUE support                           ADL either performed or assessed with clinical judgement   ADL Overall ADL's : Needs assistance/impaired Eating/Feeding: Minimal assistance;Set up;Bed level Eating/Feeding Details (indicate cue type and reason): RN clearing for ice chips as pt no longer NPO. Requirin max positioning in bed and lowering of table to self feed ice chips with spoon.                     Toilet Transfer: Moderate assistance;Stand-pivot;BSC/3in1;Rolling walker (2 wheels);Minimal assistance Toilet Transfer Details (indicate cue type and reason): Up to [facility] with mod A; back to bed with min A with RW. Toileting- Clothing  Manipulation and Hygiene: Total assistance;Sit to/from stand Toileting - Clothing Manipulation Details (indicate cue type and reason): for posterior pericare after BM.  Male purewick on throughout session     Functional mobility during ADLs: Minimal assistance;Moderate assistance;Rolling walker (2 wheels) General ADL Comments: cues for safety. Poor activity tolerance.    Extremity/Trunk Assessment Upper Extremity Assessment Upper Extremity Assessment: Generalized weakness   Lower Extremity Assessment Lower Extremity Assessment: Defer to PT evaluation        Vision   Additional Comments: mild R eye ptosis, making good eye contact with therapist during session   Perception     Praxis      Cognition Arousal/Alertness: Lethargic Behavior During Therapy: Flat affect Overall Cognitive Status: Impaired/Different from baseline Area of Impairment: Safety/judgement, Awareness, Problem solving                         Safety/Judgement: Decreased awareness of safety Awareness: Emergent, Anticipatory (Pt able to identify when he needed a break) Problem Solving: Slow processing General Comments: Following commands with increased time. Fair awareness of change in functional abilities and to determine when break is needed.        Exercises      Shoulder Instructions       General Comments      Pertinent Vitals/ Pain       Pain Assessment Pain Assessment: Faces Faces Pain Scale: Hurts a little bit Pain Location: generalized Pain Descriptors / Indicators: Sore Pain Intervention(s): Limited activity within patient's tolerance, Monitored during session  Home Living                                          Prior Functioning/Environment              Frequency  Min 2X/week        Progress Toward Goals  OT Goals(current goals can now be found in the care plan section)  Progress towards OT goals: Progressing toward goals (slowly)  Acute Rehab OT Goals Patient Stated Goal: use restroom OT Goal Formulation: With patient Time For Goal Achievement: 07/10/22 Potential to Achieve Goals: Fair ADL Goals Pt  Will Perform Lower Body Bathing: with supervision;sit to/from stand;sitting/lateral leans Pt Will Perform Lower Body Dressing: with supervision;sitting/lateral leans;sit to/from stand Pt Will Transfer to Toilet: with supervision;ambulating;regular height toilet Pt Will Perform Toileting - Clothing Manipulation and hygiene: with supervision;sitting/lateral leans;sit to/from stand Additional ADL Goal #1: Patient will be able to complete functional task in standing for 3-5 minutes prior to needing seated rest break in order to improve overall activity tolerance.  Plan Discharge plan remains appropriate;Frequency remains appropriate    Co-evaluation                 AM-PAC OT "6 Clicks" Daily Activity     Outcome Measure   Help from another person eating meals?: A Little Help from another person taking care of personal grooming?: A Little Help from another person toileting, which includes using toliet, bedpan, or urinal?: A Lot Help from another person bathing (including washing, rinsing, drying)?: A Lot Help from another person to put on and taking off regular upper body clothing?: A Lot Help from another person to put on and taking off regular lower body clothing?: A Lot 6 Click Score: 14    End of Session Equipment Utilized During Treatment: Gait belt;Rolling walker (  2 wheels)  OT Visit Diagnosis: Unsteadiness on feet (R26.81);Other abnormalities of gait and mobility (R26.89);Muscle weakness (generalized) (M62.81)   Activity Tolerance Patient limited by fatigue   Patient Left in bed;with call bell/phone within reach;with bed alarm set   Nurse Communication Mobility status        Time: 1610-9604 OT Time Calculation (min): 32 min  Charges: OT General Charges $OT Visit: 1 Visit OT Treatments $Self Care/Home Management : 23-37 mins  Tyler Deis, OTR/L [facility] Acute Rehabilitation Office: [phone]   Myrla Halsted 07/04/2022, 4:09 PM

## 2022-07-04 NOTE — Progress Notes (Signed)
NIF > -40 VC .78ml  Pt performed with great effort.

## 2022-07-04 NOTE — Progress Notes (Addendum)
Inpatient Rehab Admissions Coordinator:   CIR following, but Pt. Was total A with PT yesterday and not currently at a level to tolerate intensity of CIR. I will follow for potential admit once tolerating more.  I did discuss potential CIR admit with Pt.'s wife. She states she's interested and can provide 24/7 min-mod assist at d/c.   Megan Salon, MS, CCC-SLP Rehab Admissions Coordinator  [phone] (celll) [phone] (office)

## 2022-07-04 NOTE — TOC Progression Note (Signed)
Transition of Care [facility]) - Progression Note    Patient Details  Name: [name] MRN: [id number] Date of Birth: 11/11/1955  [facility]) CM/SW Contact  Mearl Latin, LCSW Phone Number: 07/04/2022, 10:30 AM  Clinical Narrative:    Patient transferred to 5W. CSW following for transition needs and whether patient will be able to tolerate CIR. Cortrak in place.    Expected Discharge Plan: IP Rehab Facility Barriers to Discharge: Continued Medical Work up  Expected Discharge Plan and Services     Post Acute Care Choice: IP Rehab Living arrangements for the past 2 months: Single Family Home                                       Social Determinants of Health (SDOH) Interventions SDOH Screenings   Food Insecurity: Patient Unable To Answer (06/22/2022)  Recent Concern: Food Insecurity - Food Insecurity Present (06/16/2022)  Housing: Patient Unable To Answer (06/22/2022)  Transportation Needs: Patient Unable To Answer (06/22/2022)  Utilities: Patient Unable To Answer (06/22/2022)  Depression (PHQ2-9): High Risk (06/21/2022)  Tobacco Use: Low Risk  (06/22/2022)    Readmission Risk Interventions     No data to display

## 2022-07-04 NOTE — Progress Notes (Signed)
PROGRESS NOTE                                                                                                                                                                                                             Patient Demographics:    [name], is a 67 y.o. male, DOB - Jan 28, 1956, AOZ:[id number]  Outpatient Primary MD for the patient is Sandoval, Matthew G, MD    LOS - 13  Admit date - 06/21/2022    Chief Complaint  Patient presents with   Shortness of Breath       Brief Narrative (HPI from H&P)    67 year old man w/ hx of MG, malignant thymoma s/p resection, DM type II, hypertension who was recently admitted to the hospital for myasthenia crisis and discharged home on 06/19/2022, presented again to the hospital few days later on 06/21/2022 for shortness of breath again due to myasthenia crisis this time needed to be intubated and admitted to ICU.    He was seen by neurology, and ICU team, has finished his course of IVIG treatment, was intubated extubated twice this admission, still quite weak, feeding via core track, weak cough reflex, transferred to hospitalist service on 07/03/2022 and under my care on 07/04/2022 on day 13 of hospital stay.  Significant Hospital Events:   5/22 admit 5/23 given Mg for replenishment, which exacerbated his MG symptoms. Extubation postponed. 5/24 pt extubate, reintubated. NIFs were -10 to -15 before reintubation. 5/27 extubated and reintubated 9h later due to ineffective cough.  5/28 MRI negative for stroke.  5/30 extubated. 6/1 back on IV diltiazem for hypertension and tachycardia.  Gradually improving strength.   Subjective:    Larell Watts today has, No headache, No chest pain, No abdominal pain - No Nausea, No new weakness tingling or numbness, no SOB.   Assessment  & Plan :    Medications that may worsen or trigger MG exacerbation: Class IA antiarrhythmics, magnesium,  flouroquinolones, macrolides, aminoglycosides, penicillamine, curare, interferon alpha, botox, quinine. Use with caution: calcium channel blockers, beta blockers and statins.    Acute hypoxemic and hypercarbic respiratory failure due to recurrent MG crisis with unclear trigger.  Now required 2nd reintubation.  Patient has refused tracheostomy and PICC placement.  Anxiety/PTSD component as well.  History of thymoma & past history of resection. -Seen by neurology  and PCCM, intubated extubated x 2 this admission, finished IVIG treatment  x 5, now on prednisone and Mestinon, still quite weak, monitor NIF and vital capacity, advance activity, PT OT speech.  Neurology has signed off, PCCM signed off, may require SNF/CIR.  Still quite weak overall.   Weakness and dysphagia.  Due to #1 above.  Feeding via core track tube, speech following, advance activity, encouraged to sit in chair in the daytime, monitor oral diet and calorie intake.    Sinus tachycardia/hypertension due to exertion/anxiety -Discontinue diltiazem as can worsen myasthenia, check TSH, supportive care, gentle hydration, also some element of anxiety, continue anxiety medication started in ICU.   DM2 without hyperglycemia -Sliding Scale insulin   CBG (last 3)  Recent Labs    07/03/22 2344 07/04/22 0421 07/04/22 0822  GLUCAP 178* 211* 204*         Condition - Extremely Guarded  Family Communication  :  wife bedside 07/04/22  Code Status :  Full  Consults  :  Neuro, PCCM  PUD Prophylaxis : PPI   Procedures  :            Disposition Plan  :    Status is: Inpatient   DVT Prophylaxis  :    heparin injection 5,000 Units Start: 06/22/22 0600 SCDs Start: 06/21/22 1841    Lab Results  Component Value Date   PLT 396 07/04/2022    Diet :  Diet Order             DIET DYS 3 Room service appropriate? Yes with Assist; Fluid consistency: Thin  Diet effective now                    Inpatient  Medications  Scheduled Meds:  cholecalciferol  2,000 Units Oral Daily   clonazepam  0.5 mg Per Tube BID   entecavir  1 mg Oral Daily   FLUoxetine  20 mg Per Tube Daily   heparin  5,000 Units Subcutaneous Q8H   insulin aspart  0-20 Units Subcutaneous Q4H   mouth rinse  15 mL Mouth Rinse 4 times per day   pantoprazole  40 mg Oral Daily   potassium chloride  40 mEq Oral Once   predniSONE  10 mg Per Tube Q breakfast   pyridostigmine  60 mg Per Tube Q8H   Continuous Infusions:  sodium chloride Stopped (06/30/22 0905)   feeding supplement (VITAL AF 1.2 CAL) 1,000 mL (07/04/22 0507)   PRN Meds:.acetaminophen, haloperidol, hydrALAZINE, LORazepam, mouth rinse     Objective:   Vitals:   07/03/22 2312 07/04/22 0000 07/04/22 0335 07/04/22 0758  BP: 134/81 127/86 130/87 (!) 108/57  Pulse: 97 (!) 110 (!) 106   Resp: (!) 24 (!) 24 (!) 25 (!) 25  Temp: 98.4 F (36.9 C)  98.3 F (36.8 C) 97.7 F (36.5 C)  TempSrc: Oral  Oral Oral  SpO2: 95% 94% 97% 95%  Weight:      Height:        Wt Readings from Last 3 Encounters:  07/03/22 48.2 kg  06/21/22 51.4 kg  06/16/22 53 kg     Intake/Output Summary (Last 24 hours) at 07/04/2022 1047 Last data filed at 07/04/2022 0541 Gross per 24 hour  Intake 155 ml  Output 1000 ml  Net -845 ml     Physical Exam  Awake Alert, No new F.N deficits, diffuse generalized weakness, weak cough reflex, NG tube [place].AT,PERRAL Supple Neck, No JVD,   Symmetrical Chest  wall movement, Good air movement bilaterally, CTAB RRR,No Gallops,Rubs or new Murmurs,  +ve B.Sounds, Abd Soft, No tenderness,   No Cyanosis, Clubbing or edema      Data Review:    Recent Labs  Lab 06/28/22 0117 07/01/22 0053 07/01/22 0201 07/04/22 0616  WBC 6.9  --  13.2* 11.8*  HGB 9.3* 11.2* 10.5* 9.5*  HCT 27.0* 33.0* 31.3* 29.3*  PLT 197  --  380 396  MCV 82.1  --  85.3 88.5  MCH 28.3  --  28.6 28.7  MCHC 34.4  --  33.5 32.4  RDW 14.6  --  14.8 15.3  LYMPHSABS  --   --   3.5  --   MONOABS  --   --  1.1*  --   EOSABS  --   --  0.0  --   BASOSABS  --   --  0.1  --     Recent Labs  Lab 06/28/22 0117 07/01/22 0053 07/01/22 0201 07/04/22 0616  NA 132* 139 136 143  K 3.7 3.8 3.8 3.4*  CL 102  --  102 103  CO2 20*  --  25 30  ANIONGAP 10  --  9 10  GLUCOSE 170*  --  248* 214*  BUN 28*  --  31* 41*  CREATININE 0.97  --  0.84 1.00  AST  --   --  38  --   ALT  --   --  35  --   ALKPHOS  --   --  73  --   BILITOT  --   --  0.4  --   ALBUMIN  --   --  2.6*  --   CRP  --   --   --  0.6  PROCALCITON  --   --   --  0.58  TSH  --   --   --  0.411  BNP  --   --   --  33.2  MG  --   --   --  2.5*  CALCIUM 8.4*  --  9.1 9.7      Recent Labs  Lab 06/28/22 0117 07/01/22 0201 07/04/22 0616  CRP  --   --  0.6  PROCALCITON  --   --  0.58  TSH  --   --  0.411  BNP  --   --  33.2  MG  --   --  2.5*  CALCIUM 8.4* 9.1 9.7     Recent Labs    07/04/22 0616  TSH 0.411   Radiology Reports DG Swallowing Func-Speech Pathology  Result Date: 07/04/2022 Table formatting from the original result was not included. Modified Barium Swallow Study Patient Details Name: Tyrence Fantasia MRN: [id number] Date of Birth: [birth year] Today's Date: 07/04/2022 HPI/PMH: HPI: 66/M, hx seropositive myasthenia gravis recently admitted for exacerbation. He improved on IVIG and was discharged to home on 5/20, at which point his respiratory function was vastly improved although he did have some residual ptosis. He then re-presented on 5/22, 3 days after discharge, with respiratory distress and NIF -19. He was reintubated in the ED due to concerns for increasing hypercarbia on his blood gases. He was started on a repeat course of IVIG after weighing IVIG versus PLEX.  5/22 admit, intubated, 5/23 given Mg for replenishment, which exacerbated his MG symptoms. Extubation postponed. 5/24 pt extubate, reintubated. NIFs were -10 to -15 before reintubation. 5/27 extubated and reintubated 9h later due  to ineffective cough. 5/28  MRI negative for stroke. Extubated 5/30 Clinical Impression: Clinical Impression: Pt presents with a mild to moderate oropharyngeal dysphagia with sluggish and repetitive lingual function resulting in prolonged oral phase and mild lingual and floor of mouth residue post swallow. Timing of swallowing initiation relatively good; though there are instances of thin liquids arriving just barely to pyriforms prior to hyoid burst. The primary pharyngeal impairment is mild weakness resulting in decreased epiglottic deflection; pt appears to not be able to overcome presence of NG tube. This leads to moderate vallecular and high lateral channel residue, more with solids and nectar thick liquids than thin liquids. Pt is sensate to one very trace instance of frank penetration. Protected airway throughout. He does appear to have ongoing generalized weakness and waxing and waning function. Pt can initiate thin liquids and mechanical soft solids, following bites with sips. Can attempt meds orally with thin liquids. Would not remove NG tube yet until he's consistently accepting PO. Ok for family to bring food from home. Would benefit from up to chair for meals or chair position in bed. Factors that may increase risk of adverse event in presence of aspiration Rubye Oaks & Clearance Coots 2021): Factors that may increase risk of adverse event in presence of aspiration Rubye Oaks & Clearance Coots 2021): Weak cough; Inadequate oral hygiene; Frail or deconditioned; Limited mobility; Reduced cognitive function; Poor general health and/or compromised immunity Recommendations/Plan: Swallowing Evaluation Recommendations Swallowing Evaluation Recommendations Liquid Administration via: Cup; Straw Medication Administration: Whole meds with liquid Supervision: Staff to assist with self-feeding; Full assist for feeding Swallowing strategies  : Slow rate; Small bites/sips; Follow solids with liquids Postural changes: Position pt fully  upright for meals; Stay upright 30-60 min after meals Treatment Plan Treatment Plan Treatment recommendations: Therapy as outlined in treatment plan below Follow-up recommendations: Acute inpatient rehab (3 hours/day) Functional status assessment: Patient has had a recent decline in their functional status and demonstrates the ability to make significant improvements in function in a reasonable and predictable amount of time. Treatment frequency: Min 2x/week Treatment duration: 2 weeks Interventions: Aspiration precaution training; Compensatory techniques; Respiratory muscle strength training; Diet toleration management by SLP; Patient/family education Recommendations Recommendations for follow up therapy are one component of a multi-disciplinary discharge planning process, led by the attending physician.  Recommendations may be updated based on patient status, additional functional criteria and insurance authorization. Assessment: Orofacial Exam: Orofacial Exam Oral Cavity: Oral Hygiene: Xerostomia Oral Cavity - Dentition: Adequate natural dentition Orofacial Anatomy: WFL Anatomy: No data recorded Boluses Administered: Boluses Administered Boluses Administered: Thin liquids (Level 0); Mildly thick liquids (Level 2, nectar thick); Puree; Solid  Oral Impairment Domain: Oral Impairment Domain Lip Closure: No labial escape Tongue control during bolus hold: Escape to lateral buccal cavity/floor of mouth Bolus preparation/mastication: Slow prolonged chewing/mashing with complete recollection Bolus transport/lingual motion: Slow tongue motion Oral residue: Residue collection on oral structures Location of oral residue : Floor of mouth; Tongue Initiation of pharyngeal swallow : Pyriform sinuses  Pharyngeal Impairment Domain: Pharyngeal Impairment Domain Soft palate elevation: No bolus between soft palate (SP)/pharyngeal wall (PW) Laryngeal elevation: Complete superior movement of thyroid cartilage with complete  approximation of arytenoids to epiglottic petiole Anterior hyoid excursion: Complete anterior movement Epiglottic movement: Partial inversion Laryngeal vestibule closure: Complete, no air/contrast in laryngeal vestibule Pharyngeal stripping wave : Present - complete Pharyngeal contraction (A/P view only): N/A Pharyngoesophageal segment opening: Complete distension and complete duration, no obstruction of flow Tongue base retraction: Trace column of contrast or air between tongue base and PPW Pharyngeal  residue: Collection of residue within or on pharyngeal structures Location of pharyngeal residue: Valleculae; Aryepiglottic folds  Esophageal Impairment Domain: No data recorded Pill: No data recorded Penetration/Aspiration Scale Score: Penetration/Aspiration Scale Score 1.  Material does not enter airway: Thin liquids (Level 0); Mildly thick liquids (Level 2, nectar thick); Puree; Solid 4.  Material enters airway, CONTACTS cords then ejected out: Thin liquids (Level 0) Compensatory Strategies: Compensatory Strategies Compensatory strategies: No   General Information: Caregiver present: No  Diet Prior to this Study: NPO; Cortrak/Small bore NG tube   Temperature : Normal   Respiratory Status: WFL   Supplemental O2: None (Room air)   History of Recent Intubation: Yes  Behavior/Cognition: Lethargic/Drowsy; Requires cueing; Cooperative Self-Feeding Abilities: Needs hand-over-hand assist for feeding Baseline vocal quality/speech: Hypophonia/low volume Volitional Cough: Able to elicit Volitional Swallow: Able to elicit Exam Limitations: Fatigue Goal Planning: Prognosis for improved oropharyngeal function: Good No data recorded No data recorded No data recorded Consulted and agree with results and recommendations: Pt unable/family or caregiver not available Pain: Pain Assessment Pain Assessment: Faces Faces Pain Scale: 2 Breathing: 1 Negative Vocalization: 0 Facial Expression: 1 Body Language: 0 Consolability: 1 PAINAD  Score: 3 Facial Expression: 0 Body Movements: 2 Muscle Tension: 0 Compliance with ventilator (intubated pts.): N/A Vocalization (extubated pts.): 0 CPOT Total: 2 Pain Intervention(s): Monitored during session End of Session: Start Time:SLP Start Time (ACUTE ONLY): 0930 Stop Time: SLP Stop Time (ACUTE ONLY): 0950 Time Calculation:SLP Time Calculation (min) (ACUTE ONLY): 20 min Charges: SLP Evaluations $ SLP Speech Visit: 1 Visit SLP Evaluations $MBS Swallow: 1 Procedure $FEES Swallow: 1 Procedure $Swallowing Treatment: 1 Procedure SLP visit diagnosis: SLP Visit Diagnosis: Dysphagia, oropharyngeal phase (R13.12) Past Medical History: Past Medical History: Diagnosis Date  Anxiety   Colon polyp   Depressive disorder, not elsewhere classified   External hemorrhoid   Insomnia, unspecified   Internal hemorrhoids   Myasthenia gravis without exacerbation (HCC)   Other and unspecified hyperlipidemia   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)   Type II or unspecified type diabetes mellitus without mention of complication, not stated as uncontrolled   Viral hepatitis B without mention of hepatic coma, chronic, without mention of hepatitis delta  Past Surgical History: Past Surgical History: Procedure Laterality Date  THYMECTOMY   Harlon Ditty, MA CCC-SLP [facility] Secure Chat Preferred Office [phone] Claudine Mouton 07/04/2022, 10:20 AM  DG Chest Port 1 View  Result Date: 07/04/2022 CLINICAL DATA:  Shortness of breath EXAM: PORTABLE CHEST 1 VIEW COMPARISON:  06/26/2022 FINDINGS: A feeding tube at least reaches the stomach. Artifact from EKG leads. Normal heart size and mediastinal contours. No acute infiltrate or edema. No effusion or pneumothorax. No acute osseous findings. IMPRESSION: No evidence of active disease. Electronically Signed   By: Tiburcio Pea M.D.   On: 07/04/2022 07:28      Signature  -   Susa Raring M.D on 07/04/2022 at 10:47 AM   -  To page go to  [email or web address]

## 2022-07-05 ENCOUNTER — Inpatient Hospital Stay (HOSPITAL_COMMUNITY): Payer: Medicare Other

## 2022-07-05 DIAGNOSIS — G7001 Myasthenia gravis with (acute) exacerbation: Secondary | ICD-10-CM | POA: Diagnosis not present

## 2022-07-05 DIAGNOSIS — J9600 Acute respiratory failure, unspecified whether with hypoxia or hypercapnia: Secondary | ICD-10-CM | POA: Diagnosis not present

## 2022-07-05 LAB — BLOOD GAS, ARTERIAL
Acid-Base Excess: 4.1 mmol/L — ABNORMAL HIGH (ref 0.0–2.0)
Bicarbonate: 28.5 mmol/L — ABNORMAL HIGH (ref 20.0–28.0)
O2 Saturation: 97.1 %
Patient temperature: 36.8
pCO2 arterial: 41 mmHg (ref 32–48)
pH, Arterial: 7.45 (ref 7.35–7.45)
pO2, Arterial: 82 mmHg — ABNORMAL LOW (ref 83–108)

## 2022-07-05 LAB — GLUCOSE, CAPILLARY
Glucose-Capillary: 183 mg/dL — ABNORMAL HIGH (ref 70–99)
Glucose-Capillary: 192 mg/dL — ABNORMAL HIGH (ref 70–99)
Glucose-Capillary: 203 mg/dL — ABNORMAL HIGH (ref 70–99)
Glucose-Capillary: 206 mg/dL — ABNORMAL HIGH (ref 70–99)
Glucose-Capillary: 210 mg/dL — ABNORMAL HIGH (ref 70–99)
Glucose-Capillary: 217 mg/dL — ABNORMAL HIGH (ref 70–99)
Glucose-Capillary: 221 mg/dL — ABNORMAL HIGH (ref 70–99)

## 2022-07-05 LAB — RETICULOCYTES
Immature Retic Fract: 38.7 % — ABNORMAL HIGH (ref 2.3–15.9)
RBC.: 3.06 MIL/uL — ABNORMAL LOW (ref 4.22–5.81)
Retic Count, Absolute: 189.1 10*3/uL — ABNORMAL HIGH (ref 19.0–186.0)
Retic Ct Pct: 6.2 % — ABNORMAL HIGH (ref 0.4–3.1)

## 2022-07-05 LAB — CBC WITH DIFFERENTIAL/PLATELET
Abs Immature Granulocytes: 0.11 10*3/uL — ABNORMAL HIGH (ref 0.00–0.07)
Basophils Absolute: 0 10*3/uL (ref 0.0–0.1)
Basophils Relative: 0 %
Eosinophils Absolute: 0.2 10*3/uL (ref 0.0–0.5)
Eosinophils Relative: 2 %
HCT: 24.5 % — ABNORMAL LOW (ref 39.0–52.0)
Hemoglobin: 8 g/dL — ABNORMAL LOW (ref 13.0–17.0)
Immature Granulocytes: 1 %
Lymphocytes Relative: 35 %
Lymphs Abs: 2.9 10*3/uL (ref 0.7–4.0)
MCH: 28.8 pg (ref 26.0–34.0)
MCHC: 32.7 g/dL (ref 30.0–36.0)
MCV: 88.1 fL (ref 80.0–100.0)
Monocytes Absolute: 0.5 10*3/uL (ref 0.1–1.0)
Monocytes Relative: 5 %
Neutro Abs: 4.8 10*3/uL (ref 1.7–7.7)
Neutrophils Relative %: 57 %
Platelets: 337 10*3/uL (ref 150–400)
RBC: 2.78 MIL/uL — ABNORMAL LOW (ref 4.22–5.81)
RDW: 15.4 % (ref 11.5–15.5)
WBC: 8.5 10*3/uL (ref 4.0–10.5)
nRBC: 1.2 % — ABNORMAL HIGH (ref 0.0–0.2)

## 2022-07-05 LAB — BASIC METABOLIC PANEL
Anion gap: 9 (ref 5–15)
BUN: 41 mg/dL — ABNORMAL HIGH (ref 8–23)
CO2: 26 mmol/L (ref 22–32)
Calcium: 8.7 mg/dL — ABNORMAL LOW (ref 8.9–10.3)
Chloride: 104 mmol/L (ref 98–111)
Creatinine, Ser: 0.96 mg/dL (ref 0.61–1.24)
GFR, Estimated: >60 mL/min (ref >60)
Glucose, Bld: 205 mg/dL — ABNORMAL HIGH (ref 70–99)
Potassium: 3.6 mmol/L (ref 3.5–5.1)
Sodium: 139 mmol/L (ref 135–145)

## 2022-07-05 LAB — FERRITIN: Ferritin: 192 ng/mL (ref 24–336)

## 2022-07-05 LAB — IRON AND TIBC
Iron: 53 ug/dL (ref 45–182)
Saturation Ratios: 17 % — ABNORMAL LOW (ref 17.9–39.5)
TIBC: 308 ug/dL (ref 250–450)
UIBC: 255 ug/dL

## 2022-07-05 LAB — BRAIN NATRIURETIC PEPTIDE: B Natriuretic Peptide: 34.6 pg/mL (ref 0.0–100.0)

## 2022-07-05 LAB — PROCALCITONIN: Procalcitonin: 0.5 ng/mL

## 2022-07-05 LAB — C-REACTIVE PROTEIN: CRP: 0.7 mg/dL (ref <1.0)

## 2022-07-05 LAB — FOLATE: Folate: 9.4 ng/mL (ref >5.9)

## 2022-07-05 LAB — MAGNESIUM: Magnesium: 2.2 mg/dL (ref 1.7–2.4)

## 2022-07-05 LAB — VITAMIN B12: Vitamin B-12: 527 pg/mL (ref 180–914)

## 2022-07-05 MED ORDER — ENSURE ENLIVE PO LIQD
237.0000 mL | Freq: Two times a day (BID) | ORAL | Status: DC
  Administered 2022-07-05 – 2022-07-06 (×2): 237 mL via ORAL

## 2022-07-05 MED ORDER — PANTOPRAZOLE SODIUM 40 MG PO TBEC
40.0000 mg | DELAYED_RELEASE_TABLET | Freq: Two times a day (BID) | ORAL | Status: DC
  Administered 2022-07-05 – 2022-07-07 (×5): 40 mg via ORAL
  Filled 2022-07-05 (×5): qty 1

## 2022-07-05 MED ORDER — FREE WATER
100.0000 mL | Freq: Four times a day (QID) | Status: DC
  Administered 2022-07-05 – 2022-07-07 (×9): 100 mL

## 2022-07-05 MED ORDER — SODIUM CHLORIDE 0.9 % IV SOLN: INTRAVENOUS | Status: DC

## 2022-07-05 NOTE — Inpatient Diabetes Management (Signed)
Inpatient Diabetes Program Recommendations  AACE/ADA: New Consensus Statement on Inpatient Glycemic Control (2015)  Target Ranges:  Prepandial:   less than 140 mg/dL      Peak postprandial:   less than 180 mg/dL (1-2 hours)      Critically ill patients:  140 - 180 mg/dL   Lab Results  Component Value Date   GLUCAP 206 (H) 07/05/2022   HGBA1C 5.7 (H) 06/16/2022    Review of Glycemic Control  Latest Reference Range & Units 07/04/22 08:22 07/04/22 11:47 07/04/22 16:03 07/04/22 20:47 07/05/22 00:05 07/05/22 04:07 07/05/22 08:37  Glucose-Capillary 70 - 99 mg/dL 604 (H) 540 (H) 981 (H) 177 (H) 183 (H) 192 (H) 206 (H)   Diabetes history: DM  Outpatient Diabetes medications:  Metformin 1000 mg bid Prednisone 10 mg daily Januvia 100 mg daily Current orders for Inpatient glycemic control:  Novolog 0-20 units q 4 hours Vital 55 ml/hr Prednisone 10 mg daily  Inpatient Diabetes Program Recommendations:    Note: glucose trends above inpt goal. Pt still on Tube Feeds.  May consider reducing Novolog correction moderate q 4 hours and add Novolog tube feed coverage 2 units q 4 hours (if feeds held, please hold tube feed coverage).   Christena Deem RN, MSN, BC-ADM Inpatient Diabetes Coordinator Team Pager [phone] (8a-5p)

## 2022-07-05 NOTE — Plan of Care (Signed)
  Problem: Education: Goal: Knowledge of General Education information will improve Description: Including pain rating scale, medication(s)/side effects and non-pharmacologic comfort measures 07/05/2022 0354 by Linus Galas, RN Outcome: Progressing 07/05/2022 0348 by Linus Galas, RN Outcome: Progressing   Problem: Clinical Measurements: Goal: Will remain free from infection Outcome: Progressing Goal: Respiratory complications will improve Outcome: Progressing Goal: Cardiovascular complication will be avoided 07/05/2022 0354 by Linus Galas, RN Outcome: Progressing 07/05/2022 0348 by Linus Galas, RN Outcome: Progressing   Problem: Nutrition: Goal: Adequate nutrition will be maintained Outcome: Progressing   Problem: Coping: Goal: Level of anxiety will decrease 07/05/2022 0354 by Linus Galas, RN Outcome: Progressing 07/05/2022 0348 by Linus Galas, RN Outcome: Progressing

## 2022-07-05 NOTE — Progress Notes (Signed)
Nif >-40 Unable to find a VC machine Pt with great effort

## 2022-07-05 NOTE — Progress Notes (Signed)
Speech Language Pathology Treatment: Dysphagia  Patient Details Name: [name] MRN: [id number] DOB: April 23, 1955 Today's Date: 07/05/2022 Time: 4098-1191 SLP Time Calculation (min) (ACUTE ONLY): 17 min  Assessment / Plan / Recommendation Clinical Impression  Pt demonstrates excellent improvements with eating and drinking. He reports eating small amounts of his meals, but tolerating them well. SLP reiterated airway protection and in tact swallowing with extra precautions of slow rate, following bites with sips if he feels any food sticking in the back of his throat. No signs of aspiration today. Pt able to self feed a cup of pineapple. Mastication slow but thorough. Pt enjoyed snack. Recommend pt continue mechanical soft texture and thin liquids. Will f/u as needed.   HPI HPI: 66/M, hx seropositive myasthenia gravis recently admitted for exacerbation. He improved on IVIG and was discharged to home on 5/20, at which point his respiratory function was vastly improved although he did have some residual ptosis. He then re-presented on 5/22, 3 days after discharge, with respiratory distress and NIF -19. He was reintubated in the ED due to concerns for increasing hypercarbia on his blood gases. He was started on a repeat course of IVIG after weighing IVIG versus PLEX.  5/22 admit, intubated, 5/23 given Mg for replenishment, which exacerbated his MG symptoms. Extubation postponed. 5/24 pt extubate, reintubated. NIFs were -10 to -15 before reintubation. 5/27 extubated and reintubated 9h later due to ineffective cough. 5/28 MRI negative for stroke. Extubated 5/30      SLP Plan  Continue with current plan of care      Recommendations for follow up therapy are one component of a multi-disciplinary discharge planning process, led by the attending physician.  Recommendations may be updated based on patient status, additional functional criteria and insurance authorization.    Recommendations  Diet  recommendations: Thin liquid;Dysphagia 3 (mechanical soft) Liquids provided via: Cup;Straw Medication Administration: Whole meds with liquid Supervision: Patient able to self feed Postural Changes and/or Swallow Maneuvers: Seated upright 90 degrees                  Oral care BID           Continue with current plan of care     [name], [name]  07/05/2022, 2:42 PM

## 2022-07-05 NOTE — Progress Notes (Signed)
Overnight event  Patient with history of myasthenia gravis, malignant thymoma status post resection, type 2 diabetes, hypertension admitted to the hospital on 5/22 for shortness of breath due to myasthenia crisis and has required intubation twice.  He has finished his course of IVIG treatment, now on prednisone and Mestinon.  Still quite weak, feeding via core track, weak cough reflex, transferred to hospitalist service on 6/3.  Neurology and PCCM signed off.  Informed by RN that patient has been complaining of shortness of breath since the beginning of shift and noted to be tachypneic.  Also tachycardic to the 120-130s all day.    Patient seen and examined at bedside along with critical care physician.  Complaining of shortness of breath but denies chest pain.  Tachycardic to the 120s and tachypneic to the 30s.  SpO2 97-98% on room air.  Blood pressure 149/96.  Lungs clear to auscultation and no peripheral edema.  Exam not suggestive of neuromuscular weakness.  NIF >-40  Chest x-ray showing low lung volumes with atelectasis at the bases, small left pleural effusion.  Echo done 06/22/2022 showing EF 60 to 65%, grade 1 diastolic dysfunction, trivial AVR.  ABG ordered and currently pending.    PCCM feels there is no indication for intubation or BiPAP at this time.  Differentials for tachycardia and tachypnea include anxiety versus possible PE.  Patient has been tachycardic during dayshift as well and has been on oral Cardizem.  He is also on scheduled Klonopin and Ativan PRN for anxiety.  PCCM recommending gentle IV fluid hydration and continuing treatment for anxiety.  Recommending considering imaging to rule out PE if patient remains tachycardic and tachypneic despite treating anxiety and dehydration.  Will check stat D-dimer.

## 2022-07-05 NOTE — Progress Notes (Signed)
Physical Therapy Treatment Patient Details Name: [name] MRN: [id number] DOB: April 08, 1955 Today's Date: 07/05/2022   History of Present Illness Pt is a 67 y.o. M who presents 06/16/2022 with complaints of myasthenia gravis flare; over the past 4 weeks he has been having worsening ptosis in the right eye, right face and arm weakness, difficulty swallowing, difficulty breathing. extubated and re-intubated 06/26/22 Significant PMH: myasthenia gravis, hepatitis B, T2DM, HTN.    PT Comments    Pt greeted resting in bed and agreeable to session with continued progress towards acute goals. Pt able to come to sitting EOB with min guard and instead time to complete and maintain sitting balance EOB without assist. Pt requiring mod A to come to stand initially with cues for hand placement, down to min A from recliner chair x2 trials with pt needing continued cues for safe hand placement on rise and descent. Pt able to progress gait for short in room distance this session with min A to steady and RW for support. PT needing assist to manage and navigate RW as pt with tendency for L drift. Pt needing x1 standing rest during gait due to fatigue and pt HR up to 139bpm with activity, resolving with seated rest at end of session. Current plan remains appropriate to address deficits and maximize functional independence and decrease caregiver burden. Pt continues to benefit from skilled PT services to progress toward functional mobility goals.    Recommendations for follow up therapy are one component of a multi-disciplinary discharge planning process, led by the attending physician.  Recommendations may be updated based on patient status, additional functional criteria and insurance authorization.  Follow Up Recommendations       Assistance Recommended at Discharge Intermittent Supervision/Assistance  Patient can return home with the following A little help with walking and/or transfers;Help with stairs or ramp  for entrance;A little help with bathing/dressing/bathroom   Equipment Recommendations  Other (comment) (TBA pending progress)    Recommendations for Other Services       Precautions / Restrictions Precautions Precautions: Fall Precaution Comments: watch RR and BP Restrictions Weight Bearing Restrictions: No     Mobility  Bed Mobility Overal bed mobility: Needs Assistance Bed Mobility: Supine to Sit, Sit to Supine     Supine to sit: Min guard     General bed mobility comments: min gaurd for safety, with pt needing increased time to complete    Transfers Overall transfer level: Needs assistance Equipment used: Rolling walker (2 wheels) Transfers: Sit to/from Stand, Bed to chair/wheelchair/BSC Sit to Stand: Mod assist, Min assist           General transfer comment: mod A inititally with cues for hand placement, able to rise x2 from recliner chair at end of session with light min A with continued cues for proper hand placement on rise and descent    Ambulation/Gait Ambulation/Gait assistance: Min assist Gait Distance (Feet): 35 Feet Assistive device: Rolling walker (2 wheels) Gait Pattern/deviations: Step-through pattern, Decreased stride length Gait velocity: decr     General Gait Details: slowed step-through gait with min A to steady and maange RW as pt with tendency to drift L, able to correct with cues and assist, x1 standing rest break as pt HR up to 139bpm   Stairs             Wheelchair Mobility    Modified Rankin (Stroke Patients Only)       Balance Overall balance assessment: Needs assistance Sitting-balance support: No upper  extremity supported Sitting balance-Leahy Scale: Poor Sitting balance - Comments: min A initially at EOB progressing to min guard A.   Standing balance support: Bilateral upper extremity supported, During functional activity Standing balance-Leahy Scale: Poor Standing balance comment: relies on BUE support                             Cognition Arousal/Alertness: Awake/alert Behavior During Therapy: Flat affect Overall Cognitive Status: Impaired/Different from baseline Area of Impairment: Safety/judgement, Awareness, Problem solving                         Safety/Judgement: Decreased awareness of safety Awareness: Emergent, Anticipatory (Pt able to identify when he needed a break) Problem Solving: Slow processing General Comments: Following commands with increased time. Fair awareness of change in functional abilities and to determine when break is needed.        Exercises      General Comments General comments (skin integrity, edema, etc.): HR up to 139bpm with activity, 118bpm seated at rest at end of session, pt spouse present and supportive throughout session      Pertinent Vitals/Pain Pain Assessment Pain Assessment: Faces Faces Pain Scale: Hurts a little bit Pain Location: generalized Pain Descriptors / Indicators: Sore Pain Intervention(s): Monitored during session, Limited activity within patient's tolerance    Home Living                          Prior Function            PT Goals (current goals can now be found in the care plan section) Acute Rehab PT Goals PT Goal Formulation: With patient/family Time For Goal Achievement: 07/10/22 Progress towards PT goals: Progressing toward goals    Frequency    Min 3X/week      PT Plan Current plan remains appropriate    Co-evaluation              AM-PAC PT "6 Clicks" Mobility   Outcome Measure  Help needed turning from your back to your side while in a flat bed without using bedrails?: Total Help needed moving from lying on your back to sitting on the side of a flat bed without using bedrails?: A Lot Help needed moving to and from a bed to a chair (including a wheelchair)?: A Lot Help needed standing up from a chair using your arms (e.g., wheelchair or bedside chair)?: A Lot Help  needed to walk in hospital room?: A Lot Help needed climbing 3-5 steps with a railing? : Total 6 Click Score: 10    End of Session Equipment Utilized During Treatment: Gait belt Activity Tolerance: Patient limited by fatigue;Patient tolerated treatment well Patient left: with call bell/phone within reach;in chair;with chair alarm set;with family/visitor present Nurse Communication: Mobility status PT Visit Diagnosis: Muscle weakness (generalized) (M62.81);Other symptoms and signs involving the nervous system (R29.898)     Time: 0981-1914 PT Time Calculation (min) (ACUTE ONLY): 24 min  Charges:  $Gait Training: 8-22 mins $Therapeutic Activity: 8-22 mins                     Adysen Raphael R. PTA Acute Rehabilitation Services Office: [phone]   Catalina Antigua 07/05/2022, 10:41 AM

## 2022-07-05 NOTE — Significant Event (Addendum)
Rapid Response Event Note   Reason for Call :  Tachycardia and tachypnea, pt c/o SOB  Per charting pt has been tachycardia and tachypneic t/o the day but he is now c/o SOB and his RR is reaching 50s.   Initial Focused Assessment:  Pt lying in bed with eyes open. His breathing is tachypneic and shallow. He c/o SOB, denies CP. Lungs are clear and diminished, cough is weak/nonproductive. ABD soft, nontender. Cortrac tube with positive air bolus heard over stomach area. Skin cool to touch.   HR-129, BP-149/96, RR-49, SpO2-97% on RA  Interventions:  PCXR-1. Low lung volumes with atelectasis at the lung bases. 2. Small left pleural effusion. ABG PCCM reconsulted: Recommends gentle IV hydration, treating anxiety, and CTA chest r/o PE if prior plan does not work  Plan of Care:  Hydration and treatment for anxiety. CTA chest if tachycardia/tachypnea does not improve. Please call RRT if further assistance needed.   Event Summary:   MD Notified: Dr. Loney Loh Call [phone] Arrival Time:2130 End [id number]  Terrilyn Saver, RN

## 2022-07-05 NOTE — Progress Notes (Signed)
Nutrition Follow-up  DOCUMENTATION CODES:  Severe malnutrition in context of acute illness/injury  INTERVENTION:  Recommend continuing with tube feeds at this time until pt consistently is able to eat >60% of estimated needs. Continue Vital AF 1.2 at 55 mL/hr (1320 mL per day) Free water flush: 100 mL q6h Tube Feeds at goal provides 1584 kcal, 99 gm protein, and 1070 mL free water daily.  Ensure Enlive po BID, each supplement provides 350 kcal and 20 grams of protein.  NUTRITION DIAGNOSIS:  Severe Malnutrition related to acute illness (MG flare) as evidenced by moderate fat depletion, moderate muscle depletion, percent weight loss. - Ongoing, being addressed via TF  GOAL:  Patient will meet greater than or equal to 90% of their needs - Met via TF  MONITOR:  PO intake, Supplement acceptance, Labs, TF tolerance, Weight trends, I & O's  REASON FOR ASSESSMENT:  Consult Enteral/tube feeding initiation and management  ASSESSMENT:  Pt with PMH of MG, DM, anxiety, and HTN admitted for MG flare.  05/22 - admitted  05/24 - extubated; s/p cortrak placement; distal stomach per xray; later re-intubated 05/30 - extubated 06/02 - transferred to floor 06/04 - MBS, diet advanced to Dysphagia 3, thin liquids  Pt laying in bed, wife at bedside. Wife reports that pt ate ok and drank all liquids on tray. Wife shared that pt is nervous to eat due to concern for choking due to previous occurrence. Discussed that SLP put him on a diet appropriate for swallow and will continue to visit to work on swallow. RD observed pt breakfast tray, estimated ~50% ate. Discussed using oral nutrition supplement to assist in PO intake; agreeable.   Discussed adding free water with MD, MD ok with adding. Discussed leaving Cortrak tube in due to wife concern with swallow and monitoring PO intake.   Meal Intake 6/4: 50% x 2 meals   Medications reviewed and include: Vitamin D3, NovoLog SSI, Protonix, Prednisone Labs  reviewed: Sodium 139, Potassium 3.6, BUN 41, Creatinine 0.96, Magnesium 2.2, Iron 53, Folate, 9.4, CRP 0.7, Vitamin B12 527 CBG: 177-206 x 24 hours   Diet Order:   Diet Order             DIET DYS 3 Room service appropriate? No; Fluid consistency: Thin  Diet effective now                  EDUCATION NEEDS: Education needs have been addressed  Skin:  Skin Assessment: Reviewed RN Assessment  Last BM:  6/5 - Type 5  Height:  Ht Readings from Last 1 Encounters:  07/02/22 5\' 5"  (1.651 m)   Weight:  Wt Readings from Last 1 Encounters:  07/05/22 49.1 kg   Ideal Body Weight:  61.8 kg  BMI:  Body mass index is 18.01 kg/m.  Estimated Nutritional Needs:  Kcal:  1500-1700 Protein:  80-100 grams Fluid:  >1.7 L/day   Kirby Crigler RD, LDN Clinical Dietitian See [facility] for contact information.

## 2022-07-05 NOTE — Plan of Care (Signed)
  Problem: Education: Goal: Knowledge of General Education information will improve Description: Including pain rating scale, medication(s)/side effects and non-pharmacologic comfort measures Outcome: Progressing   Problem: Clinical Measurements: Goal: Will remain free from infection Outcome: Progressing Goal: Respiratory complications will improve Outcome: Progressing Goal: Cardiovascular complication will be avoided Outcome: Progressing   Problem: Coping: Goal: Level of anxiety will decrease Outcome: Progressing   Problem: Respiratory: Goal: Ability to maintain a clear airway and adequate ventilation will improve Outcome: Progressing

## 2022-07-05 NOTE — Progress Notes (Signed)
   NAME:  [name], MRN:  [id number], DOB:  [birth year], LOS: 14 ADMISSION DATE:  06/21/2022, CONSULTATION DATE:  5/22 REFERRING MD:  Karene Fry, CHIEF COMPLAINT:  SOB   History of Present Illness:  67 year old man w/ hx of MG and recent admit for flare presenting to ER for worsening SOB.  Low NIF in ER with extreme tachypnea so intubated for airway protection.  No infectious prodrome, seems to have started just after discharged 2 days ago.  History per wife at bedside.  Neurology is following.   6/5: Tonight PCCM was reconsulted because patient was tachypneic though O2 sat on room air was 98%, he was tachycardic to 120s.  Patient is complaining of subjective shortness of breath but denies chest pain, nausea, vomiting, headache or other complaints  Pertinent  Medical History  MG DM2 Anxiety HTN Significant Hospital Events: Including procedures, antibiotic start and stop dates in addition to other pertinent events   5/22 admit 5/23 given Mg for replenishment, which exacerbated his MG symptoms. Extubation postponed. 5/24 pt extubate, reintubated. NIFs were -10 to -15 before reintubation. 5/27 extubated and reintubated 9h later due to ineffective cough.  5/28 MRI negative for stroke.  5/30 extubated. 6/1 back on IV diltiazem for hypertension and tachycardia.  Gradually improving strength.  Interim History / Subjective:  Reconsulted  Objective   Blood pressure (!) 149/96, pulse (!) 127, temperature 98.2 F (36.8 C), temperature source Oral, resp. rate (!) 26, height 5\' 5"  (1.651 m), weight 49.1 kg, SpO2 96 %.        Intake/Output Summary (Last 24 hours) at 07/05/2022 2227 Last data filed at 07/05/2022 1833 Gross per 24 hour  Intake 2581 ml  Output 1400 ml  Net 1181 ml   Filed Weights   07/02/22 1146 07/03/22 0427 07/05/22 0500  Weight: 51 kg 48.2 kg 49.1 kg    Examination: Physical exam: General: Elderly male, lying on the bed HEENT: [place]/AT, eyes anicteric.  moist mucus  membranes cortrak in place Neuro: Alert, awake following commands, antigravity in both upper extremities, able to lift his head up from the bed Chest: Tachypneic, clear to auscultation bilaterally, no wheezes or rhonchi Heart: Tachycardic, regular rhythm,, no murmurs or gallops Abdomen: Soft, nontender, nondistended, bowel sounds present Skin: No rash   Ancillary tests personally reviewed:   Na: 132 MRI brain negative for stroke. Staph aureus in tracheal aspirate.   Assessment & Plan:  Tachypnea and tachycardia but the differentials or anxiety versus possible pulmonary emboli with clear lung sounds and x-ray chest Patient has been tachycardic for quite some time, he is on oral diltiazem It was thought to be due to anxiety at this time he is tachypneic though on room air with O2 sat 98% Lung exam is clear, x-ray chest showed no infiltrates Recommend giving him gentle IV fluid hydration Treating anxiety if he remains tachypneic and tachycardic despite treating anxiety and dehydration consider doing CT angiogram of chest to rule out PE Continue myasthenia medications Is NIF is -40 His exam is not suggestive of neuromuscular weakness  At this time patient does not need ICU level of care, conveyed to primary team.  PCCM will sign off.  Please call with questions   Best Practice (right click and "Reselect all SmartList Selections" daily)     Cheri Fowler, MD  Pulmonary Critical Care See Amion for pager If no response to pager, please call [phone] until 7pm After 7pm, Please call E-link [phone]'

## 2022-07-05 NOTE — Progress Notes (Signed)
Nif >-40 VC 0.86 ml   Pt performed with great effort.

## 2022-07-05 NOTE — Progress Notes (Signed)
Inpatient Rehab Admissions Coordinator:   CIR following, Pt did well with therapies yesterday and today and was able to discuss rehab with me this morning. He and his wife are interested. Per attending MD, pt. May be ready for d/c to CIR as early as tomorrow. I will follow for potential admit pending medical readiness and bed availability.   Megan Salon, MS, CCC-SLP Rehab Admissions Coordinator  [phone] (celll) [phone] (office)

## 2022-07-05 NOTE — Progress Notes (Signed)
PROGRESS NOTE                                                                                                                                                                                                             Patient Demographics:    [name], is a 67 y.o. male, DOB - 12-26-1955, ZOX:[id number]  Outpatient Primary MD for the patient is Sandoval, Matthew G, MD    LOS - 14  Admit date - 06/21/2022    Chief Complaint  Patient presents with   Shortness of Breath       Brief Narrative (HPI from H&P)    67 year old man w/ hx of MG, malignant thymoma s/p resection, DM type II, hypertension who was recently admitted to the hospital for myasthenia crisis and discharged home on 06/19/2022, presented again to the hospital few days later on 06/21/2022 for shortness of breath again due to myasthenia crisis this time needed to be intubated and admitted to ICU.    He was seen by neurology, and ICU team, has finished his course of IVIG treatment, was intubated extubated twice this admission, still quite weak, feeding via core track, weak cough reflex, transferred to hospitalist service on 07/03/2022 and under my care on 07/04/2022 on day 13 of hospital stay.  Significant Hospital Events:   5/22 admit 5/23 given Mg for replenishment, which exacerbated his MG symptoms. Extubation postponed. 5/24 pt extubate, reintubated. NIFs were -10 to -15 before reintubation. 5/27 extubated and reintubated 9h later due to ineffective cough.  5/28 MRI negative for stroke.  5/30 extubated. 6/1 back on IV diltiazem for hypertension and tachycardia.  Gradually improving strength.   Subjective:    [place] Spake today has, No headache, No chest pain, No abdominal pain - No Nausea, No new weakness tingling or numbness, no SOB.   Assessment  & Plan :    Medications that may worsen or trigger MG exacerbation: Class IA antiarrhythmics, magnesium,  flouroquinolones, macrolides, aminoglycosides, penicillamine, curare, interferon alpha, botox, quinine. Use with caution: calcium channel blockers, beta blockers and statins.    Acute hypoxemic and hypercarbic respiratory failure due to recurrent MG crisis with unclear trigger.  Now required 2nd reintubation.  Patient has refused tracheostomy and PICC placement.  Anxiety/PTSD component as well.  History of thymoma & past history of resection. -Seen by neurology  and PCCM, intubated extubated x 2 this admission, finished IVIG treatment  x 5, now on prednisone and Mestinon, still quite weak, monitor NIF and vital capacity, advance activity, PT OT speech.  Neurology has signed off, PCCM signed off, may require SNF/CIR.  Still quite weak overall but definite gradual improvement, if oral intake has improved will try and remove core track on 07/05/2022.  07/04/22 NIF > - 40 VC 1.86 L   Weakness and dysphagia.  Due to #1 above.  Feeding via core track tube, speech following, advance activity, encouraged to sit in chair in the daytime, monitor oral diet and calorie intake.    Sinus tachycardia/hypertension due to exertion/anxiety  -Discontinue diltiazem as can worsen myasthenia, check TSH, supportive care, gentle hydration, also some element of anxiety, continue anxiety medication started in ICU.   DM2 without hyperglycemia  -Sliding Scale insulin   CBG (last 3)  Recent Labs    07/04/22 2047 07/05/22 0005 07/05/22 0407  GLUCAP 177* 183* 192*         Condition - Extremely Guarded  Family Communication  :  wife bedside 07/04/22, 07/05/2022  Code Status :  Full  Consults  :  Neuro, PCCM  PUD Prophylaxis : PPI   Procedures  :            Disposition Plan  :    Status is: Inpatient   DVT Prophylaxis  :    heparin injection 5,000 Units Start: 06/22/22 0600 SCDs Start: 06/21/22 1841    Lab Results  Component Value Date   PLT 337 07/05/2022    Diet :  Diet Order              DIET DYS 3 Room service appropriate? No; Fluid consistency: Thin  Diet effective now                    Inpatient Medications  Scheduled Meds:  cholecalciferol  2,000 Units Oral Daily   clonazepam  0.5 mg Per Tube BID   emtricitabine-tenofovir AF  1 tablet Oral Daily   heparin  5,000 Units Subcutaneous Q8H   insulin aspart  0-20 Units Subcutaneous Q4H   mouth rinse  15 mL Mouth Rinse 4 times per day   pantoprazole  40 mg Oral BID AC   predniSONE  10 mg Per Tube Q breakfast   pyridostigmine  60 mg Per Tube Q8H   Continuous Infusions:  sodium chloride Stopped (06/30/22 0905)   feeding supplement (VITAL AF 1.2 CAL) 1,000 mL (07/05/22 0014)   PRN Meds:.acetaminophen, haloperidol, hydrALAZINE, LORazepam, mouth rinse     Objective:   Vitals:   07/04/22 2300 07/05/22 0000 07/05/22 0310 07/05/22 0500  BP: 110/76 121/79 119/73   Pulse: 94 99 100   Resp: 20 (!) 24 (!) 24   Temp: 98.2 F (36.8 C) 98 F (36.7 C) 98.1 F (36.7 C)   TempSrc: Oral Oral Oral   SpO2: 97% 100% 98%   Weight:    49.1 kg  Height:        Wt Readings from Last 3 Encounters:  07/05/22 49.1 kg  06/21/22 51.4 kg  06/16/22 53 kg     Intake/Output Summary (Last 24 hours) at 07/05/2022 0811 Last data filed at 07/05/2022 0600 Gross per 24 hour  Intake 1691 ml  Output 1600 ml  Net 91 ml     Physical Exam  Awake Alert, No new F.N deficits, diffuse generalized weakness, improved cough reflex, NG tube [place].AT,PERRAL Supple  Neck, No JVD,   Symmetrical Chest wall movement, Good air movement bilaterally, CTAB RRR,No Gallops,Rubs or new Murmurs,  +ve B.Sounds, Abd Soft, No tenderness,   No Cyanosis, Clubbing or edema      Data Review:    Recent Labs  Lab 07/01/22 0053 07/01/22 0201 07/04/22 0616 07/05/22 0607  WBC  --  13.2* 11.8* 8.5  HGB 11.2* 10.5* 9.5* 8.0*  HCT 33.0* 31.3* 29.3* 24.5*  PLT  --  380 396 337  MCV  --  85.3 88.5 88.1  MCH  --  28.6 28.7 28.8  MCHC  --  33.5 32.4 32.7   RDW  --  14.8 15.3 15.4  LYMPHSABS  --  3.5  --  2.9  MONOABS  --  1.1*  --  0.5  EOSABS  --  0.0  --  0.2  BASOSABS  --  0.1  --  0.0    Recent Labs  Lab 07/01/22 0053 07/01/22 0201 07/04/22 0616 07/05/22 0607  NA 139 136 143 139  K 3.8 3.8 3.4* 3.6  CL  --  102 103 104  CO2  --  25 30 26   ANIONGAP  --  9 10 9   GLUCOSE  --  248* 214* 205*  BUN  --  31* 41* 41*  CREATININE  --  0.84 1.00 0.96  AST  --  38  --   --   ALT  --  35  --   --   ALKPHOS  --  73  --   --   BILITOT  --  0.4  --   --   ALBUMIN  --  2.6*  --   --   CRP  --   --  0.6 0.7  PROCALCITON  --   --  0.58  --   TSH  --   --  0.411  --   BNP  --   --  33.2 34.6  MG  --   --  2.5* 2.2  CALCIUM  --  9.1 9.7 8.7*      Recent Labs  Lab 07/01/22 0201 07/04/22 0616 07/05/22 0607  CRP  --  0.6 0.7  PROCALCITON  --  0.58  --   TSH  --  0.411  --   BNP  --  33.2 34.6  MG  --  2.5* 2.2  CALCIUM 9.1 9.7 8.7*     Recent Labs    07/04/22 0616  TSH 0.411   Radiology Reports DG Swallowing Func-Speech Pathology  Result Date: 07/04/2022 Table formatting from the original result was not included. Modified Barium Swallow Study Patient Details Name: Sudarshan Delpino MRN: [id number] Date of Birth: [birth year] Today's Date: 07/04/2022 HPI/PMH: HPI: 66/M, hx seropositive myasthenia gravis recently admitted for exacerbation. He improved on IVIG and was discharged to home on 5/20, at which point his respiratory function was vastly improved although he did have some residual ptosis. He then re-presented on 5/22, 3 days after discharge, with respiratory distress and NIF -19. He was reintubated in the ED due to concerns for increasing hypercarbia on his blood gases. He was started on a repeat course of IVIG after weighing IVIG versus PLEX.  5/22 admit, intubated, 5/23 given Mg for replenishment, which exacerbated his MG symptoms. Extubation postponed. 5/24 pt extubate, reintubated. NIFs were -10 to -15 before reintubation. 5/27  extubated and reintubated 9h later due to ineffective cough. 5/28 MRI negative for stroke. Extubated 5/30 Clinical Impression: Clinical Impression: Pt presents with  a mild to moderate oropharyngeal dysphagia with sluggish and repetitive lingual function resulting in prolonged oral phase and mild lingual and floor of mouth residue post swallow. Timing of swallowing initiation relatively good; though there are instances of thin liquids arriving just barely to pyriforms prior to hyoid burst. The primary pharyngeal impairment is mild weakness resulting in decreased epiglottic deflection; pt appears to not be able to overcome presence of NG tube. This leads to moderate vallecular and high lateral channel residue, more with solids and nectar thick liquids than thin liquids. Pt is sensate to one very trace instance of frank penetration. Protected airway throughout. He does appear to have ongoing generalized weakness and waxing and waning function. Pt can initiate thin liquids and mechanical soft solids, following bites with sips. Can attempt meds orally with thin liquids. Would not remove NG tube yet until he's consistently accepting PO. Ok for family to bring food from home. Would benefit from up to chair for meals or chair position in bed. Factors that may increase risk of adverse event in presence of aspiration [place] [employer] 2021): Factors that may increase risk of adverse event in presence of aspiration [place] [employer] 2021): Weak cough; Inadequate oral hygiene; Frail or deconditioned; Limited mobility; Reduced cognitive function; Poor general health and/or compromised immunity Recommendations/Plan: Swallowing Evaluation Recommendations Swallowing Evaluation Recommendations Liquid Administration via: Cup; Straw Medication Administration: Whole meds with liquid Supervision: Staff to assist with self-feeding; Full assist for feeding Swallowing strategies  : Slow rate; Small bites/sips; Follow solids with liquids  Postural changes: Position pt fully upright for meals; Stay upright 30-60 min after meals Treatment Plan Treatment Plan Treatment recommendations: Therapy as outlined in treatment plan below Follow-up recommendations: Acute inpatient rehab (3 hours/day) Functional status assessment: Patient has had a recent decline in their functional status and demonstrates the ability to make significant improvements in function in a reasonable and predictable amount of time. Treatment frequency: Min 2x/week Treatment duration: 2 weeks Interventions: Aspiration precaution training; Compensatory techniques; Respiratory muscle strength training; Diet toleration management by SLP; Patient/family education Recommendations Recommendations for follow up therapy are one component of a multi-disciplinary discharge planning process, led by the attending physician.  Recommendations may be updated based on patient status, additional functional criteria and insurance authorization. Assessment: Orofacial Exam: Orofacial Exam Oral Cavity: Oral Hygiene: Xerostomia Oral Cavity - Dentition: Adequate natural dentition Orofacial Anatomy: WFL Anatomy: No data recorded Boluses Administered: Boluses Administered Boluses Administered: Thin liquids (Level 0); Mildly thick liquids (Level 2, nectar thick); Puree; Solid  Oral Impairment Domain: Oral Impairment Domain Lip Closure: No labial escape Tongue control during bolus hold: Escape to lateral buccal cavity/floor of mouth Bolus preparation/mastication: Slow prolonged chewing/mashing with complete recollection Bolus transport/lingual motion: Slow tongue motion Oral residue: Residue collection on oral structures Location of oral residue : Floor of mouth; Tongue Initiation of pharyngeal swallow : Pyriform sinuses  Pharyngeal Impairment Domain: Pharyngeal Impairment Domain Soft palate elevation: No bolus between soft palate (SP)/pharyngeal wall (PW) Laryngeal elevation: Complete superior movement of thyroid  cartilage with complete approximation of arytenoids to epiglottic petiole Anterior hyoid excursion: Complete anterior movement Epiglottic movement: Partial inversion Laryngeal vestibule closure: Complete, no air/contrast in laryngeal vestibule Pharyngeal stripping wave : Present - complete Pharyngeal contraction (A/P view only): N/A Pharyngoesophageal segment opening: Complete distension and complete duration, no obstruction of flow Tongue base retraction: Trace column of contrast or air between tongue base and PPW Pharyngeal residue: Collection of residue within or on pharyngeal structures Location of pharyngeal residue:  Valleculae; Aryepiglottic folds  Esophageal Impairment Domain: No data recorded Pill: No data recorded Penetration/Aspiration Scale Score: Penetration/Aspiration Scale Score 1.  Material does not enter airway: Thin liquids (Level 0); Mildly thick liquids (Level 2, nectar thick); Puree; Solid 4.  Material enters airway, CONTACTS cords then ejected out: Thin liquids (Level 0) Compensatory Strategies: Compensatory Strategies Compensatory strategies: No   General Information: Caregiver present: No  Diet Prior to this Study: NPO; Cortrak/Small bore NG tube   Temperature : Normal   Respiratory Status: WFL   Supplemental O2: None (Room air)   History of Recent Intubation: Yes  Behavior/Cognition: Lethargic/Drowsy; Requires cueing; Cooperative Self-Feeding Abilities: Needs hand-over-hand assist for feeding Baseline vocal quality/speech: Hypophonia/low volume Volitional Cough: Able to elicit Volitional Swallow: Able to elicit Exam Limitations: Fatigue Goal Planning: Prognosis for improved oropharyngeal function: Good No data recorded No data recorded No data recorded Consulted and agree with results and recommendations: Pt unable/family or caregiver not available Pain: Pain Assessment Pain Assessment: Faces Faces Pain Scale: 2 Breathing: 1 Negative Vocalization: 0 Facial Expression: 1 Body Language: 0  Consolability: 1 PAINAD Score: 3 Facial Expression: 0 Body Movements: 2 Muscle Tension: 0 Compliance with ventilator (intubated pts.): N/A Vocalization (extubated pts.): 0 CPOT Total: 2 Pain Intervention(s): Monitored during session End of Session: Start Time:SLP Start Time (ACUTE ONLY): 0930 Stop Time: SLP Stop Time (ACUTE ONLY): 0950 Time Calculation:SLP Time Calculation (min) (ACUTE ONLY): 20 min Charges: SLP Evaluations $ SLP Speech Visit: 1 Visit SLP Evaluations $MBS Swallow: 1 Procedure $FEES Swallow: 1 Procedure $Swallowing Treatment: 1 Procedure SLP visit diagnosis: SLP Visit Diagnosis: Dysphagia, oropharyngeal phase (R13.12) Past Medical History: Past Medical History: Diagnosis Date  Anxiety   Colon polyp   Depressive disorder, not elsewhere classified   External hemorrhoid   Insomnia, unspecified   Internal hemorrhoids   Myasthenia gravis without exacerbation (HCC)   Other and unspecified hyperlipidemia   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)   Type II or unspecified type diabetes mellitus without mention of complication, not stated as uncontrolled   Viral hepatitis B without mention of hepatic coma, chronic, without mention of hepatitis delta  Past Surgical History: Past Surgical History: Procedure Laterality Date  THYMECTOMY   Harlon Ditty, MA CCC-SLP Acute Rehabilitation Services Secure Chat Preferred Office [phone] Claudine Mouton 07/04/2022, 10:20 AM  DG Chest Port 1 View  Result Date: 07/04/2022 CLINICAL DATA:  Shortness of breath EXAM: PORTABLE CHEST 1 VIEW COMPARISON:  06/26/2022 FINDINGS: A feeding tube at least reaches the stomach. Artifact from EKG leads. Normal heart size and mediastinal contours. No acute infiltrate or edema. No effusion or pneumothorax. No acute osseous findings. IMPRESSION: No evidence of active disease. Electronically Signed   By: Tiburcio Pea M.D.   On: 07/04/2022 07:28      Signature  -   Susa Raring M.D on 07/05/2022 at 8:11 AM   -   To page go to [email or web address]

## 2022-07-06 ENCOUNTER — Inpatient Hospital Stay (HOSPITAL_COMMUNITY): Payer: Medicare Other

## 2022-07-06 ENCOUNTER — Encounter (HOSPITAL_COMMUNITY): Payer: Self-pay

## 2022-07-06 DIAGNOSIS — G7001 Myasthenia gravis with (acute) exacerbation: Secondary | ICD-10-CM | POA: Diagnosis not present

## 2022-07-06 DIAGNOSIS — M7989 Other specified soft tissue disorders: Secondary | ICD-10-CM

## 2022-07-06 LAB — GLUCOSE, CAPILLARY
Glucose-Capillary: 147 mg/dL — ABNORMAL HIGH (ref 70–99)
Glucose-Capillary: 235 mg/dL — ABNORMAL HIGH (ref 70–99)
Glucose-Capillary: 188 mg/dL — ABNORMAL HIGH (ref 70–99)
Glucose-Capillary: 199 mg/dL — ABNORMAL HIGH (ref 70–99)
Glucose-Capillary: 186 mg/dL — ABNORMAL HIGH (ref 70–99)
Glucose-Capillary: 234 mg/dL — ABNORMAL HIGH (ref 70–99)

## 2022-07-06 LAB — BASIC METABOLIC PANEL
Anion gap: 8 (ref 5–15)
BUN: 30 mg/dL — ABNORMAL HIGH (ref 8–23)
CO2: 24 mmol/L (ref 22–32)
Calcium: 8.2 mg/dL — ABNORMAL LOW (ref 8.9–10.3)
Chloride: 104 mmol/L (ref 98–111)
Creatinine, Ser: 0.85 mg/dL (ref 0.61–1.24)
GFR, Estimated: >60 mL/min (ref >60)
Glucose, Bld: 151 mg/dL — ABNORMAL HIGH (ref 70–99)
Potassium: 3.6 mmol/L (ref 3.5–5.1)
Sodium: 136 mmol/L (ref 135–145)

## 2022-07-06 LAB — HEPATITIS B DNA, ULTRAQUANTITATIVE, PCR
HBV DNA SERPL PCR-ACNC: NOT DETECTED IU/mL
HBV DNA SERPL PCR-LOG IU: UNDETERMINED log10 IU/mL

## 2022-07-06 LAB — CBC WITH DIFFERENTIAL/PLATELET
Abs Immature Granulocytes: 0.1 10*3/uL — ABNORMAL HIGH (ref 0.00–0.07)
Basophils Absolute: 0 10*3/uL (ref 0.0–0.1)
Basophils Relative: 1 %
Eosinophils Absolute: 0.2 10*3/uL (ref 0.0–0.5)
Eosinophils Relative: 2 %
HCT: 24.6 % — ABNORMAL LOW (ref 39.0–52.0)
Hemoglobin: 7.9 g/dL — ABNORMAL LOW (ref 13.0–17.0)
Immature Granulocytes: 1 %
Lymphocytes Relative: 30 %
Lymphs Abs: 2.5 10*3/uL (ref 0.7–4.0)
MCH: 28.3 pg (ref 26.0–34.0)
MCHC: 32.1 g/dL (ref 30.0–36.0)
MCV: 88.2 fL (ref 80.0–100.0)
Monocytes Absolute: 0.5 10*3/uL (ref 0.1–1.0)
Monocytes Relative: 6 %
Neutro Abs: 5.1 10*3/uL (ref 1.7–7.7)
Neutrophils Relative %: 60 %
Platelets: 314 10*3/uL (ref 150–400)
RBC: 2.79 MIL/uL — ABNORMAL LOW (ref 4.22–5.81)
RDW: 15.5 % (ref 11.5–15.5)
WBC: 8.4 10*3/uL (ref 4.0–10.5)
nRBC: 1 % — ABNORMAL HIGH (ref 0.0–0.2)

## 2022-07-06 LAB — C-REACTIVE PROTEIN: CRP: 0.6 mg/dL (ref <1.0)

## 2022-07-06 LAB — MAGNESIUM: Magnesium: 2.1 mg/dL (ref 1.7–2.4)

## 2022-07-06 LAB — HEPATITIS B E ANTIBODY: Hep B E Ab: REACTIVE — AB

## 2022-07-06 LAB — D-DIMER, QUANTITATIVE: D-Dimer, Quant: 2.14 ug/mL-FEU — ABNORMAL HIGH (ref 0.00–0.50)

## 2022-07-06 LAB — BRAIN NATRIURETIC PEPTIDE: B Natriuretic Peptide: 55.8 pg/mL (ref 0.0–100.0)

## 2022-07-06 LAB — PROCALCITONIN: Procalcitonin: 0.38 ng/mL

## 2022-07-06 LAB — HEPATITIS B E ANTIGEN: Hep B E Ag: NEGATIVE

## 2022-07-06 LAB — HEPATITIS B SURFACE ANTIGEN: Hepatitis B Surface Ag: REACTIVE — AB

## 2022-07-06 MED ORDER — IOHEXOL 350 MG/ML SOLN
75.0000 mL | Freq: Once | INTRAVENOUS | Status: AC | PRN
  Administered 2022-07-06: 75 mL via INTRAVENOUS

## 2022-07-06 MED ORDER — CLONAZEPAM 0.5 MG PO TABS
1.0000 mg | ORAL_TABLET | Freq: Two times a day (BID) | ORAL | Status: DC
  Administered 2022-07-06 – 2022-07-07 (×3): 1 mg via ORAL
  Filled 2022-07-06 (×3): qty 2

## 2022-07-06 MED ORDER — ALPRAZOLAM 0.5 MG PO TABS
0.5000 mg | ORAL_TABLET | Freq: Three times a day (TID) | ORAL | Status: DC | PRN
  Administered 2022-07-06 – 2022-07-07 (×3): 0.5 mg via ORAL
  Filled 2022-07-06 (×3): qty 1

## 2022-07-06 MED ORDER — OSMOLITE 1.5 CAL PO LIQD
1000.0000 mL | ORAL | Status: DC
  Administered 2022-07-06: 1000 mL
  Filled 2022-07-06 (×2): qty 1000

## 2022-07-06 MED ORDER — PAROXETINE HCL 10 MG PO TABS
10.0000 mg | ORAL_TABLET | Freq: Every day | ORAL | Status: DC
  Administered 2022-07-06: 10 mg via ORAL
  Filled 2022-07-06 (×2): qty 1

## 2022-07-06 MED ORDER — SODIUM CHLORIDE 0.9 % IV SOLN: INTRAVENOUS | Status: AC

## 2022-07-06 MED ORDER — ENSURE ENLIVE PO LIQD
237.0000 mL | Freq: Three times a day (TID) | ORAL | Status: DC
  Administered 2022-07-06 – 2022-07-07 (×4): 237 mL via ORAL

## 2022-07-06 NOTE — Progress Notes (Signed)
Patient back from vascular.

## 2022-07-06 NOTE — Progress Notes (Signed)
Lower extremity venous bilateral study completed.   Please see CV Proc for preliminary results.   Donatello Kleve, RDMS, RVT  

## 2022-07-06 NOTE — Progress Notes (Signed)
Occupational Therapy Treatment Patient Details Name: [name] MRN: [id number] DOB: 09-30-1955 Today's Date: 07/06/2022   History of present illness Pt is a 67 y.o. M who presents 06/16/2022 with complaints of myasthenia gravis flare; over the past 4 weeks he has been having worsening ptosis in the right eye, right face and arm weakness, difficulty swallowing, difficulty breathing. extubated and re-intubated 06/26/22-06/29/22 Significant PMH: myasthenia gravis, hepatitis B, T2DM, HTN.   OT comments  Pt progressing slowly towards OT goals this session. Session limited by Pt being transported to CT. Pt initially on bed pan, bed mobility at min A and peri care at total A. Grooming with set up. Able to come EOB with min guard and extra time. Return supine with mod A for BLE assist. Pt on RA throughout session, HR up to 119, RR up to 34. OT will continue to follow acutely POC remains appropriate at this time. Wife present throughout session.    Recommendations for follow up therapy are one component of a multi-disciplinary discharge planning process, led by the attending physician.  Recommendations may be updated based on patient status, additional functional criteria and insurance authorization.    Assistance Recommended at Discharge Frequent or constant Supervision/Assistance  Patient can return home with the following  Assistance with cooking/housework;Direct supervision/assist for medications management;Direct supervision/assist for financial management;Assist for transportation;Help with stairs or ramp for entrance;A lot of help with bathing/dressing/bathroom;A lot of help with walking and/or transfers   Equipment Recommendations  Other (comment) (defer to next venue of care)    Recommendations for Other Services      Precautions / Restrictions Precautions Precautions: Fall Precaution Comments: watch RR and BP Restrictions Weight Bearing Restrictions: No       Mobility Bed  Mobility Overal bed mobility: Needs Assistance Bed Mobility: Supine to Sit, Sit to Supine     Supine to sit: Min guard Sit to supine: Mod assist   General bed mobility comments: min gaurd for safety, with pt needing increased time to complete, mod A to return supine with legs    Transfers                   General transfer comment: NT this session due to departure for CT     Balance Overall balance assessment: Needs assistance Sitting-balance support: No upper extremity supported Sitting balance-Leahy Scale: Poor Sitting balance - Comments: min A initially at EOB                                   ADL either performed or assessed with clinical judgement   ADL Overall ADL's : Needs assistance/impaired     Grooming: Bed level;Wash/dry face;Wash/dry Designer, jewellery Details (indicate cue type and reason): on bed pan when OT entered room Toileting- Clothing Manipulation and Hygiene: Total assistance;Bed level Toileting - Clothing Manipulation Details (indicate cue type and reason): for posterior pericare after BM. Male purewick on throughout session       General ADL Comments: cues for safety. Poor activity tolerance.    Extremity/Trunk Assessment              Vision   Additional Comments: R eye ptosis still present, making good eye contact with therapist throughout   Perception     Praxis  Cognition Arousal/Alertness: Awake/alert Behavior During Therapy: Flat affect Overall Cognitive Status: Impaired/Different from baseline Area of Impairment: Safety/judgement, Awareness, Problem solving                         Safety/Judgement: Decreased awareness of safety Awareness: Emergent, Anticipatory Problem Solving: Slow processing General Comments: following commands today, limited session due to departure to [place]        Exercises      Shoulder Instructions        General Comments HR up to 119, RR up to 34    Pertinent Vitals/ Pain       Pain Assessment Pain Assessment: Faces Faces Pain Scale: Hurts a little bit Pain Location: generalized Pain Descriptors / Indicators: Sore Pain Intervention(s): Monitored during session, Repositioned  Home Living                                          Prior Functioning/Environment              Frequency  Min 2X/week        Progress Toward Goals  OT Goals(current goals can now be found in the care plan section)  Progress towards OT goals: Progressing toward goals  Acute Rehab OT Goals Patient Stated Goal: get stronger OT Goal Formulation: With patient/family Time For Goal Achievement: 07/10/22 Potential to Achieve Goals: Good  Plan Discharge plan remains appropriate;Frequency remains appropriate    Co-evaluation                 AM-PAC OT "6 Clicks" Daily Activity     Outcome Measure   Help from another person eating meals?: A Little Help from another person taking care of personal grooming?: A Little Help from another person toileting, which includes using toliet, bedpan, or urinal?: A Lot Help from another person bathing (including washing, rinsing, drying)?: A Lot Help from another person to put on and taking off regular upper body clothing?: A Lot Help from another person to put on and taking off regular lower body clothing?: A Lot 6 Click Score: 14    End of Session    OT Visit Diagnosis: Unsteadiness on feet (R26.81);Other abnormalities of gait and mobility (R26.89);Muscle weakness (generalized) (M62.81)   Activity Tolerance Patient tolerated treatment well (limited by transport to CT)   Patient Left in bed;Other (comment) (going to CT for imaging)   Nurse Communication Mobility status        Time: 1610-9604 OT Time Calculation (min): 22 min  Charges: OT General Charges $OT Visit: 1 Visit OT Treatments $Self Care/Home Management :  8-22 mins  Nyoka Cowden OTR/L Acute Rehabilitation Services Office: [phone]  [facility] 07/06/2022, 1:11 PM

## 2022-07-06 NOTE — Progress Notes (Signed)
RT note. Attempt to obtain nif/VC at this time.  Patient not available and at Scan.  RT will come back and obtain at a later time.

## 2022-07-06 NOTE — Progress Notes (Signed)
Nutrition Follow-up  DOCUMENTATION CODES:  Severe malnutrition in context of acute illness/injury  INTERVENTION:  Recommend continuing with tube feeds at this time until pt consistently is able to eat >60% of estimated needs. Transition to nocturnal tube feeds via Cortrak: Osmolite 1.5 at 83 mL/hr x 12 hours from 1800 to 0600(1000 mL per day) Free water flush: 100 mL q6h Tube Feeds at goal provides 1500 kcal, 63 gm protein, and 1162 mL free water daily.  Increase Ensure Enlive po TID, each supplement provides 350 kcal and 20 grams of protein. Calorie Count  NUTRITION DIAGNOSIS:  Severe Malnutrition related to acute illness (MG flare) as evidenced by moderate fat depletion, moderate muscle depletion, percent weight loss. - Ongoing, being addressed via TF  GOAL:  Patient will meet greater than or equal to 90% of their needs - Met via TF  MONITOR:  PO intake, Supplement acceptance, Labs, TF tolerance, Weight trends, I & O's  REASON FOR ASSESSMENT:  Consult Enteral/tube feeding initiation and management  ASSESSMENT:  Pt with PMH of MG, DM, anxiety, and HTN admitted for MG flare.  05/22 - admitted  05/24 - extubated; s/p cortrak placement; distal stomach per xray; later re-intubated 05/30 - extubated 06/02 - transferred to floor 06/04 - MBS, diet advanced to Dysphagia 3, thin liquids 06/06 - transition to nocturnal tube feeds; calorie count started  MD asked RD to adjust pt tube feed regimen to assess PO intake to assist in discharge planning.   Pt laying in bed, family at bedside. Discussed starting calorie count to assess how much PO pt is able to take. Reviewed stopping tube feeds and running them during the day. RD stopped tube feeds from infusing; RN notified. Ensure open in room, minimal drank.  RD hung calorie count envelope on door, explained to family.   Meal Intake 6/4: 50% x 2 meals  6/5: 10-25% x 3 meals  Medications reviewed and include: Vitamin D3, NovoLog SSI,  Protonix, Prednisone Labs reviewed: Sodium 136, Potassium 3.6, BUN 30, Creatinine 0.85, Magnesium 2.1 CBG: 147-235 x 24 hours   Diet Order:   Diet Order             DIET DYS 3 Room service appropriate? No; Fluid consistency: Thin  Diet effective now                  EDUCATION NEEDS: Education needs have been addressed  Skin:  Skin Assessment: Reviewed RN Assessment  Last BM:  6/6 - Type 5, large  Height:  Ht Readings from Last 1 Encounters:  07/02/22 5\' 5"  (1.651 m)   Weight:  Wt Readings from Last 1 Encounters:  07/05/22 49.1 kg   Ideal Body Weight:  61.8 kg  BMI:  Body mass index is 18.01 kg/m.  Estimated Nutritional Needs:  Kcal:  1500-1700 Protein:  80-100 grams Fluid:  >1.7 L/day   Kirby Crigler RD, LDN Clinical Dietitian See [facility] for contact information.

## 2022-07-06 NOTE — Progress Notes (Signed)
Patient has not eaten or drank enough to document throughout the day. Meals 15-20 %and just had a few sips with meds and with meals. Patient appetite very poor. Patient weak and feeble. Patient was up in the chair this afternoon with one assist.

## 2022-07-06 NOTE — Progress Notes (Signed)
Patient to vascular.

## 2022-07-06 NOTE — Progress Notes (Signed)
Patient to CT.

## 2022-07-06 NOTE — Progress Notes (Signed)
RT note. Patient able to obtain VC 0.42mL  Nif -40 with good patient effort.

## 2022-07-06 NOTE — Progress Notes (Signed)
Physical Therapy Treatment Patient Details Name: [name] MRN: [id number] DOB: Sep 30, 1955 Today's Date: 07/06/2022   History of Present Illness Pt is a 67 y.o. M who presents 06/16/2022 with complaints of myasthenia gravis flare; over the past 4 weeks he has been having worsening ptosis in the right eye, right face and arm weakness, difficulty swallowing, difficulty breathing. extubated and re-intubated 06/26/22-06/29/22 Significant PMH: myasthenia gravis, hepatitis B, T2DM, HTN.    PT Comments    Pt greeted supine in bed and agreeable to session with continued progress towards acute goals. Pt requiring min A to rise to stand initially down to min guard for subsequent stands throughout session. Pt demonstrating good recall for safe hand placement during transfers from previous session. Pt continues to require min A during gait with RW support, with pt needing less RW assist this session, however distance limited to pt fatigue. Pt continues to be motivated to progress and current plan remains appropriate to address deficits and maximize functional independence and decrease caregiver burden. Pt continues to benefit from skilled PT services to progress toward functional mobility goals.    Recommendations for follow up therapy are one component of a multi-disciplinary discharge planning process, led by the attending physician.  Recommendations may be updated based on patient status, additional functional criteria and insurance authorization.  Follow Up Recommendations       Assistance Recommended at Discharge Intermittent Supervision/Assistance  Patient can return home with the following A little help with walking and/or transfers;Help with stairs or ramp for entrance;A little help with bathing/dressing/bathroom   Equipment Recommendations  Other (comment)    Recommendations for Other Services       Precautions / Restrictions Precautions Precautions: Fall Precaution Comments: watch RR and  BP Restrictions Weight Bearing Restrictions: No     Mobility  Bed Mobility Overal bed mobility: Needs Assistance Bed Mobility: Supine to Sit, Sit to Supine     Supine to sit: Min assist     General bed mobility comments: min A to elevate trunk    Transfers Overall transfer level: Needs assistance Equipment used: Rolling walker (2 wheels) Transfers: Sit to/from Stand Sit to Stand: Min assist, Min guard           General transfer comment: min A to rise initially with pt demonstrating good carryover for hand placement, min guard from chair x3    Ambulation/Gait Ambulation/Gait assistance: Min assist Gait Distance (Feet): 30 Feet Assistive device: Rolling walker (2 wheels) Gait Pattern/deviations: Step-through pattern, Decreased stride length Gait velocity: decr     General Gait Details: slowed step-through gait with min A to steady pt with improved RW management this session, pt limtied by fatigue this session, needing x1 seated rest   Stairs             Wheelchair Mobility    Modified Rankin (Stroke Patients Only)       Balance Overall balance assessment: Needs assistance Sitting-balance support: No upper extremity supported Sitting balance-Leahy Scale: Poor Sitting balance - Comments: min A initially at EOB   Standing balance support: Bilateral upper extremity supported, During functional activity Standing balance-Leahy Scale: Poor Standing balance comment: relies on BUE support, able to static stand at sink with single UE support for ADLs                            Cognition Arousal/Alertness: Awake/alert Behavior During Therapy: Flat affect Overall Cognitive Status: Impaired/Different from baseline Area of Impairment:  Safety/judgement, Awareness, Problem solving                         Safety/Judgement: Decreased awareness of safety Awareness: Emergent, Anticipatory Problem Solving: Slow processing General Comments:  following commands today, limited session due to departure to [place]        Exercises      General Comments        Pertinent Vitals/Pain Pain Assessment Pain Assessment: Faces Faces Pain Scale: Hurts a little bit Pain Location: generalized Pain Descriptors / Indicators: Sore Pain Intervention(s): Monitored during session, Repositioned    Home Living                          Prior Function            PT Goals (current goals can now be found in the care plan section) Acute Rehab PT Goals PT Goal Formulation: With patient/family Time For Goal Achievement: 07/10/22 Progress towards PT goals: Progressing toward goals    Frequency    Min 3X/week      PT Plan Current plan remains appropriate    Co-evaluation              AM-PAC PT "6 Clicks" Mobility   Outcome Measure  Help needed turning from your back to your side while in a flat bed without using bedrails?: Total Help needed moving from lying on your back to sitting on the side of a flat bed without using bedrails?: A Lot Help needed moving to and from a bed to a chair (including a wheelchair)?: A Lot Help needed standing up from a chair using your arms (e.g., wheelchair or bedside chair)?: A Little Help needed to walk in hospital room?: A Lot Help needed climbing 3-5 steps with a railing? : Total 6 Click Score: 11    End of Session   Activity Tolerance: Patient limited by fatigue;Patient tolerated treatment well Patient left: in chair;with call bell/phone within reach;with chair alarm set Nurse Communication: Mobility status PT Visit Diagnosis: Muscle weakness (generalized) (M62.81);Other symptoms and signs involving the nervous system (Z61.096)     Time: 0454-0981 PT Time Calculation (min) (ACUTE ONLY): 24 min  Charges:  $Gait Training: 8-22 mins $Therapeutic Activity: 8-22 mins                     Nashayla Telleria R. PTA Acute Rehabilitation Services Office: [phone]   Catalina Antigua 07/06/2022, 5:01 PM

## 2022-07-06 NOTE — Progress Notes (Signed)
PROGRESS NOTE                                                                                                                                                                                                             Patient Demographics:    [name], is a 67 y.o. male, DOB - 04-28-1955, GEX:[id number]  Outpatient Primary MD for the patient is [place], [name], MD    LOS - 15  Admit date - 06/21/2022    Chief Complaint  Patient presents with   Shortness of Breath       Brief Narrative (HPI from H&P)    67 year old man w/ hx of MG, malignant thymoma s/p resection, DM type II, hypertension who was recently admitted to the hospital for myasthenia crisis and discharged home on 06/19/2022, presented again to the hospital few days later on 06/21/2022 for shortness of breath again due to myasthenia crisis this time needed to be intubated and admitted to ICU.    He was seen by neurology, and ICU team, has finished his course of IVIG treatment, was intubated extubated twice this admission, still quite weak, feeding via core track, weak cough reflex, transferred to hospitalist service on 07/03/2022 and under my care on 07/04/2022 on day 13 of hospital stay.  Significant Hospital Events:   5/22 admit 5/23 given Mg for replenishment, which exacerbated his MG symptoms. Extubation postponed. 5/24 pt extubate, reintubated. NIFs were -10 to -15 before reintubation. 5/27 extubated and reintubated 9h later due to ineffective cough.  5/28 MRI negative for stroke.  5/30 extubated. 6/1 back on IV diltiazem for hypertension and tachycardia.  Gradually improving strength.   Subjective:   Patient in bed, appears comfortable, denies any headache, no fever, no chest pain or pressure, no shortness of breath , no abdominal pain. No focal weakness.   Assessment  & Plan :    Medications that may worsen or trigger MG exacerbation: Class IA  antiarrhythmics, magnesium, flouroquinolones, macrolides, aminoglycosides, penicillamine, curare, interferon alpha, botox, quinine. Use with caution: calcium channel blockers, beta blockers and statins.    Acute hypoxemic and hypercarbic respiratory failure due to recurrent MG crisis with unclear trigger.  Now required 2nd reintubation.  Patient has refused tracheostomy and PICC placement. Anxiety/PTSD component as well.  History of thymoma & past history of resection.  -Seen  by neurology and PCCM, intubated extubated x 2 this admission, finished IVIG treatment  x 5, now on prednisone and Mestinon, still quite weak, monitor NIF and vital capacity, advance activity, PT OT speech.  Neurology has signed off, PCCM signed off, may require SNF/CIR.  Still quite weak overall but definite gradual improvement, if oral intake has improved will try and remove core track on 07/05/2022.  07/04/22 NIF > - 40 VC 1.86 L   Weakness and dysphagia.  Due to #1 above.  Feeding via core track tube, speech following, advance activity, encouraged to sit in chair in the daytime, monitor oral diet and calorie intake.    Extreme anxiety.  Placed on Klonopin, Paxil and Xanax.    Sinus tachycardia/hypertension due to exertion/anxiety  - likely should have autonomic dysfunction from myasthenia gravis along with extreme anxiety, TSH is stable, he is otherwise symptom-free, since D-dimer was elevated CTA and lower extremity venous duplex are being obtained although clinical suspicion for PE is low.  Will control anxiety, stable recent echocardiogram with preserved EF and no wall motion abnormality, avoiding use of Cardizem or beta-blocker due to likelihood of myasthenia crisis being exacerbated.  Control anxiety and monitor.   DM2 without hyperglycemia  -Sliding Scale insulin   CBG (last 3)  Recent Labs    07/06/22 0123 07/06/22 0448 07/06/22 0747  GLUCAP 147* 235* 188*         Condition - Extremely Guarded  Family  Communication  :  wife bedside 07/04/22, 07/05/2022  Code Status :  Full  Consults  :  Neuro, PCCM  PUD Prophylaxis : PPI   Procedures  :     CTA.    Lower extremity venous duplex.      Disposition Plan  :    Status is: Inpatient   DVT Prophylaxis  :    heparin injection 5,000 Units Start: 06/22/22 0600 SCDs Start: 06/21/22 1841    Lab Results  Component Value Date   PLT 314 07/06/2022    Diet :  Diet Order             DIET DYS 3 Room service appropriate? No; Fluid consistency: Thin  Diet effective now                    Inpatient Medications  Scheduled Meds:  cholecalciferol  2,000 Units Oral Daily   clonazePAM  1 mg Oral BID   emtricitabine-tenofovir AF  1 tablet Oral Daily   feeding supplement  237 mL Oral BID BM   free water  100 mL Per Tube Q6H   heparin  5,000 Units Subcutaneous Q8H   insulin aspart  0-20 Units Subcutaneous Q4H   mouth rinse  15 mL Mouth Rinse 4 times per day   pantoprazole  40 mg Oral BID AC   PARoxetine  10 mg Oral Daily   predniSONE  10 mg Per Tube Q breakfast   pyridostigmine  60 mg Per Tube Q8H   Continuous Infusions:  sodium chloride 75 mL/hr at 07/06/22 0623   feeding supplement (VITAL AF 1.2 CAL) 1,000 mL (07/05/22 1833)   PRN Meds:.acetaminophen, ALPRAZolam, haloperidol, hydrALAZINE, mouth rinse     Objective:   Vitals:   07/05/22 2305 07/06/22 0312 07/06/22 0400 07/06/22 0746  BP: (!) 148/90 (!) 147/86 (!) 151/96   Pulse: (!) 118 (!) 117 (!) 114   Resp: (!) 26 (!) 28  20  Temp: 98.2 F (36.8 C) 98.3 F (36.8 C)  TempSrc: Oral Oral  Oral  SpO2: 96% 95% 93%   Weight:      Height:        Wt Readings from Last 3 Encounters:  07/05/22 49.1 kg  06/21/22 51.4 kg  06/16/22 53 kg     Intake/Output Summary (Last 24 hours) at 07/06/2022 0921 Last data filed at 07/06/2022 0800 Gross per 24 hour  Intake 1485 ml  Output 800 ml  Net 685 ml     Physical Exam  Awake Alert, No new F.N deficits, diffuse  generalized weakness, improved cough reflex, NG tube, anxious +++ [place].AT,PERRAL Supple Neck, No JVD,   Symmetrical Chest wall movement, Good air movement bilaterally, CTAB RRR,No Gallops,Rubs or new Murmurs,  +ve B.Sounds, Abd Soft, No tenderness,   No Cyanosis, Clubbing or edema      Data Review:    Recent Labs  Lab 07/01/22 0053 07/01/22 0201 07/04/22 0616 07/05/22 0607 07/06/22 0046  WBC  --  13.2* 11.8* 8.5 8.4  HGB 11.2* 10.5* 9.5* 8.0* 7.9*  HCT 33.0* 31.3* 29.3* 24.5* 24.6*  PLT  --  380 396 337 314  MCV  --  85.3 88.5 88.1 88.2  MCH  --  28.6 28.7 28.8 28.3  MCHC  --  33.5 32.4 32.7 32.1  RDW  --  14.8 15.3 15.4 15.5  LYMPHSABS  --  3.5  --  2.9 2.5  MONOABS  --  1.1*  --  0.5 0.5  EOSABS  --  0.0  --  0.2 0.2  BASOSABS  --  0.1  --  0.0 0.0    Recent Labs  Lab 07/01/22 0053 07/01/22 0201 07/04/22 0616 07/05/22 0607 07/06/22 0046  NA 139 136 143 139 136  K 3.8 3.8 3.4* 3.6 3.6  CL  --  102 103 104 104  CO2  --  25 30 26 24   ANIONGAP  --  9 10 9 8   GLUCOSE  --  248* 214* 205* 151*  BUN  --  31* 41* 41* 30*  CREATININE  --  0.84 1.00 0.96 0.85  AST  --  38  --   --   --   ALT  --  35  --   --   --   ALKPHOS  --  73  --   --   --   BILITOT  --  0.4  --   --   --   ALBUMIN  --  2.6*  --   --   --   CRP  --   --  0.6 0.7 0.6  DDIMER  --   --   --   --  2.14*  PROCALCITON  --   --  0.58 0.50 0.38  TSH  --   --  0.411  --   --   BNP  --   --  33.2 34.6 55.8  MG  --   --  2.5* 2.2 2.1  CALCIUM  --  9.1 9.7 8.7* 8.2*      Recent Labs  Lab 07/01/22 0201 07/04/22 0616 07/05/22 0607 07/06/22 0046  CRP  --  0.6 0.7 0.6  DDIMER  --   --   --  2.14*  PROCALCITON  --  0.58 0.50 0.38  TSH  --  0.411  --   --   BNP  --  33.2 34.6 55.8  MG  --  2.5* 2.2 2.1  CALCIUM 9.1 9.7 8.7* 8.2*     Recent  Labs    07/04/22 0616  TSH 0.411   Radiology Reports DG Chest Port 1 View  Result Date: 07/05/2022 CLINICAL DATA:  Acute respiratory distress. EXAM:  PORTABLE CHEST 1 VIEW COMPARISON:  07/04/2022. FINDINGS: The heart size and mediastinal contours are stable. There is atherosclerotic calcification of the aorta. Lung volumes are low with mild atelectasis at the lung bases. There is a small left pleural effusion. No pneumothorax. An enteric tube courses over the stomach and out of the field of view. Sternotomy wires are present over the midline. IMPRESSION: 1. Low lung volumes with atelectasis at the lung bases. 2. Small left pleural effusion. Electronically Signed   By: Thornell Sartorius M.D.   On: 07/05/2022 22:13   DG Swallowing Func-Speech Pathology  Result Date: 07/04/2022 Table formatting from the original result was not included. Modified Barium Swallow Study Patient Details Name: [name] MRN: [id number] Date of Birth: [birth year] Today's Date: 07/04/2022 HPI/PMH: HPI: 66/M, hx seropositive myasthenia gravis recently admitted for exacerbation. He improved on IVIG and was discharged to home on 5/20, at which point his respiratory function was vastly improved although he did have some residual ptosis. He then re-presented on 5/22, 3 days after discharge, with respiratory distress and NIF -19. He was reintubated in the ED due to concerns for increasing hypercarbia on his blood gases. He was started on a repeat course of IVIG after weighing IVIG versus PLEX.  5/22 admit, intubated, 5/23 given Mg for replenishment, which exacerbated his MG symptoms. Extubation postponed. 5/24 pt extubate, reintubated. NIFs were -10 to -15 before reintubation. 5/27 extubated and reintubated 9h later due to ineffective cough. 5/28 MRI negative for stroke. Extubated 5/30 Clinical Impression: Clinical Impression: Pt presents with a mild to moderate oropharyngeal dysphagia with sluggish and repetitive lingual function resulting in prolonged oral phase and mild lingual and floor of mouth residue post swallow. Timing of swallowing initiation relatively good; though there are instances  of thin liquids arriving just barely to pyriforms prior to hyoid burst. The primary pharyngeal impairment is mild weakness resulting in decreased epiglottic deflection; pt appears to not be able to overcome presence of NG tube. This leads to moderate vallecular and high lateral channel residue, more with solids and nectar thick liquids than thin liquids. Pt is sensate to one very trace instance of frank penetration. Protected airway throughout. He does appear to have ongoing generalized weakness and waxing and waning function. Pt can initiate thin liquids and mechanical soft solids, following bites with sips. Can attempt meds orally with thin liquids. Would not remove NG tube yet until he's consistently accepting PO. Ok for family to bring food from home. Would benefit from up to chair for meals or chair position in bed. Factors that may increase risk of adverse event in presence of aspiration Rubye Oaks & Clearance Coots 2021): Factors that may increase risk of adverse event in presence of aspiration Rubye Oaks & Clearance Coots 2021): Weak cough; Inadequate oral hygiene; Frail or deconditioned; Limited mobility; Reduced cognitive function; Poor general health and/or compromised immunity Recommendations/Plan: Swallowing Evaluation Recommendations Swallowing Evaluation Recommendations Liquid Administration via: Cup; Straw Medication Administration: Whole meds with liquid Supervision: Staff to assist with self-feeding; Full assist for feeding Swallowing strategies  : Slow rate; Small bites/sips; Follow solids with liquids Postural changes: Position pt fully upright for meals; Stay upright 30-60 min after meals Treatment Plan Treatment Plan Treatment recommendations: Therapy as outlined in treatment plan below Follow-up recommendations: Acute inpatient rehab (3 hours/day) Functional status assessment: Patient has had a  recent decline in their functional status and demonstrates the ability to make significant improvements in function in a  reasonable and predictable amount of time. Treatment frequency: Min 2x/week Treatment duration: 2 weeks Interventions: Aspiration precaution training; Compensatory techniques; Respiratory muscle strength training; Diet toleration management by SLP; Patient/family education Recommendations Recommendations for follow up therapy are one component of a multi-disciplinary discharge planning process, led by the attending physician.  Recommendations may be updated based on patient status, additional functional criteria and insurance authorization. Assessment: Orofacial Exam: Orofacial Exam Oral Cavity: Oral Hygiene: Xerostomia Oral Cavity - Dentition: Adequate natural dentition Orofacial Anatomy: WFL Anatomy: No data recorded Boluses Administered: Boluses Administered Boluses Administered: Thin liquids (Level 0); Mildly thick liquids (Level 2, nectar thick); Puree; Solid  Oral Impairment Domain: Oral Impairment Domain Lip Closure: No labial escape Tongue control during bolus hold: Escape to lateral buccal cavity/floor of mouth Bolus preparation/mastication: Slow prolonged chewing/mashing with complete recollection Bolus transport/lingual motion: Slow tongue motion Oral residue: Residue collection on oral structures Location of oral residue : Floor of mouth; Tongue Initiation of pharyngeal swallow : Pyriform sinuses  Pharyngeal Impairment Domain: Pharyngeal Impairment Domain Soft palate elevation: No bolus between soft palate (SP)/pharyngeal wall (PW) Laryngeal elevation: Complete superior movement of thyroid cartilage with complete approximation of arytenoids to epiglottic petiole Anterior hyoid excursion: Complete anterior movement Epiglottic movement: Partial inversion Laryngeal vestibule closure: Complete, no air/contrast in laryngeal vestibule Pharyngeal stripping wave : Present - complete Pharyngeal contraction (A/P view only): N/A Pharyngoesophageal segment opening: Complete distension and complete duration, no  obstruction of flow Tongue base retraction: Trace column of contrast or air between tongue base and PPW Pharyngeal residue: Collection of residue within or on pharyngeal structures Location of pharyngeal residue: Valleculae; Aryepiglottic folds  Esophageal Impairment Domain: No data recorded Pill: No data recorded Penetration/Aspiration Scale Score: Penetration/Aspiration Scale Score 1.  Material does not enter airway: Thin liquids (Level 0); Mildly thick liquids (Level 2, nectar thick); Puree; Solid 4.  Material enters airway, CONTACTS cords then ejected out: Thin liquids (Level 0) Compensatory Strategies: Compensatory Strategies Compensatory strategies: No   General Information: Caregiver present: No  Diet Prior to this Study: NPO; Cortrak/Small bore NG tube   Temperature : Normal   Respiratory Status: WFL   Supplemental O2: None (Room air)   History of Recent Intubation: Yes  Behavior/Cognition: Lethargic/Drowsy; Requires cueing; Cooperative Self-Feeding Abilities: Needs hand-over-hand assist for feeding Baseline vocal quality/speech: Hypophonia/low volume Volitional Cough: Able to elicit Volitional Swallow: Able to elicit Exam Limitations: Fatigue Goal Planning: Prognosis for improved oropharyngeal function: Good No data recorded No data recorded No data recorded Consulted and agree with results and recommendations: Pt unable/family or caregiver not available Pain: Pain Assessment Pain Assessment: Faces Faces Pain Scale: 2 Breathing: 1 Negative Vocalization: 0 Facial Expression: 1 Body Language: 0 Consolability: 1 PAINAD Score: 3 Facial Expression: 0 Body Movements: 2 Muscle Tension: 0 Compliance with ventilator (intubated pts.): N/A Vocalization (extubated pts.): 0 CPOT Total: 2 Pain Intervention(s): Monitored during session End of Session: Start Time:SLP Start Time (ACUTE ONLY): 0930 Stop Time: SLP Stop Time (ACUTE ONLY): 0950 Time Calculation:SLP Time Calculation (min) (ACUTE ONLY): 20 min Charges: SLP  Evaluations $ SLP Speech Visit: 1 Visit SLP Evaluations $MBS Swallow: 1 Procedure $FEES Swallow: 1 Procedure $Swallowing Treatment: 1 Procedure SLP visit diagnosis: SLP Visit Diagnosis: Dysphagia, oropharyngeal phase (R13.12) Past Medical History: Past Medical History: Diagnosis Date  Anxiety   Colon polyp   Depressive disorder, not elsewhere classified   External hemorrhoid  Insomnia, unspecified   Internal hemorrhoids   Myasthenia gravis without exacerbation (HCC)   Other and unspecified hyperlipidemia   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)   Type II or unspecified type diabetes mellitus without mention of complication, not stated as uncontrolled   Viral hepatitis B without mention of hepatic coma, chronic, without mention of hepatitis delta  Past Surgical History: Past Surgical History: Procedure Laterality Date  THYMECTOMY   Harlon Ditty, MA CCC-SLP Acute Rehabilitation Services Secure Chat Preferred Office [phone] Claudine Mouton 07/04/2022, 10:20 AM  DG Chest Port 1 View  Result Date: 07/04/2022 CLINICAL DATA:  Shortness of breath EXAM: PORTABLE CHEST 1 VIEW COMPARISON:  06/26/2022 FINDINGS: A feeding tube at least reaches the stomach. Artifact from EKG leads. Normal heart size and mediastinal contours. No acute infiltrate or edema. No effusion or pneumothorax. No acute osseous findings. IMPRESSION: No evidence of active disease. Electronically Signed   By: Tiburcio Pea M.D.   On: 07/04/2022 07:28      Signature  -   Susa Raring M.D on 07/06/2022 at 9:21 AM   -  To page go to [email or web address]

## 2022-07-06 NOTE — Progress Notes (Signed)
0.89 VC NIF -40

## 2022-07-07 ENCOUNTER — Encounter (HOSPITAL_COMMUNITY): Payer: Self-pay | Admitting: Internal Medicine

## 2022-07-07 ENCOUNTER — Other Ambulatory Visit: Payer: Self-pay

## 2022-07-07 ENCOUNTER — Inpatient Hospital Stay (HOSPITAL_COMMUNITY)
Admission: RE | Admit: 2022-07-07 | Discharge: 2022-07-18 | DRG: 056 | Disposition: A | Discharge status: Home Health | Payer: Medicare Other | Source: Intra-hospital | Attending: Physical Medicine and Rehabilitation | Admitting: Physical Medicine and Rehabilitation

## 2022-07-07 DIAGNOSIS — Z8719 Personal history of other diseases of the digestive system: Secondary | ICD-10-CM | POA: Diagnosis not present

## 2022-07-07 DIAGNOSIS — D649 Anemia, unspecified: Secondary | ICD-10-CM | POA: Diagnosis present

## 2022-07-07 DIAGNOSIS — I77819 Aortic ectasia, unspecified site: Secondary | ICD-10-CM | POA: Diagnosis present

## 2022-07-07 DIAGNOSIS — E119 Type 2 diabetes mellitus without complications: Secondary | ICD-10-CM | POA: Diagnosis present

## 2022-07-07 DIAGNOSIS — G7000 Myasthenia gravis without (acute) exacerbation: Secondary | ICD-10-CM | POA: Diagnosis present

## 2022-07-07 DIAGNOSIS — E785 Hyperlipidemia, unspecified: Secondary | ICD-10-CM | POA: Diagnosis present

## 2022-07-07 DIAGNOSIS — Z79899 Other long term (current) drug therapy: Secondary | ICD-10-CM

## 2022-07-07 DIAGNOSIS — G4700 Insomnia, unspecified: Secondary | ICD-10-CM | POA: Diagnosis present

## 2022-07-07 DIAGNOSIS — R0602 Shortness of breath: Secondary | ICD-10-CM | POA: Diagnosis not present

## 2022-07-07 DIAGNOSIS — Z681 Body mass index (BMI) 19 or less, adult: Secondary | ICD-10-CM | POA: Diagnosis not present

## 2022-07-07 DIAGNOSIS — R1310 Dysphagia, unspecified: Secondary | ICD-10-CM | POA: Diagnosis not present

## 2022-07-07 DIAGNOSIS — B1910 Unspecified viral hepatitis B without hepatic coma: Secondary | ICD-10-CM | POA: Diagnosis present

## 2022-07-07 DIAGNOSIS — E876 Hypokalemia: Secondary | ICD-10-CM | POA: Diagnosis present

## 2022-07-07 DIAGNOSIS — K219 Gastro-esophageal reflux disease without esophagitis: Secondary | ICD-10-CM | POA: Diagnosis present

## 2022-07-07 DIAGNOSIS — Z881 Allergy status to other antibiotic agents status: Secondary | ICD-10-CM

## 2022-07-07 DIAGNOSIS — E871 Hypo-osmolality and hyponatremia: Secondary | ICD-10-CM | POA: Diagnosis present

## 2022-07-07 DIAGNOSIS — Z883 Allergy status to other anti-infective agents status: Secondary | ICD-10-CM

## 2022-07-07 DIAGNOSIS — R64 Cachexia: Secondary | ICD-10-CM | POA: Diagnosis present

## 2022-07-07 DIAGNOSIS — E1169 Type 2 diabetes mellitus with other specified complication: Secondary | ICD-10-CM | POA: Diagnosis present

## 2022-07-07 DIAGNOSIS — F339 Major depressive disorder, recurrent, unspecified: Secondary | ICD-10-CM | POA: Diagnosis present

## 2022-07-07 DIAGNOSIS — E43 Unspecified severe protein-calorie malnutrition: Secondary | ICD-10-CM | POA: Diagnosis present

## 2022-07-07 DIAGNOSIS — R29810 Facial weakness: Secondary | ICD-10-CM | POA: Diagnosis present

## 2022-07-07 DIAGNOSIS — Z888 Allergy status to other drugs, medicaments and biological substances status: Secondary | ICD-10-CM

## 2022-07-07 DIAGNOSIS — Z794 Long term (current) use of insulin: Secondary | ICD-10-CM | POA: Diagnosis not present

## 2022-07-07 DIAGNOSIS — R1312 Dysphagia, oropharyngeal phase: Secondary | ICD-10-CM | POA: Diagnosis present

## 2022-07-07 DIAGNOSIS — F411 Generalized anxiety disorder: Secondary | ICD-10-CM | POA: Diagnosis not present

## 2022-07-07 DIAGNOSIS — F419 Anxiety disorder, unspecified: Secondary | ICD-10-CM | POA: Diagnosis present

## 2022-07-07 DIAGNOSIS — R262 Difficulty in walking, not elsewhere classified: Secondary | ICD-10-CM | POA: Diagnosis present

## 2022-07-07 DIAGNOSIS — R269 Unspecified abnormalities of gait and mobility: Secondary | ICD-10-CM | POA: Diagnosis present

## 2022-07-07 DIAGNOSIS — G7001 Myasthenia gravis with (acute) exacerbation: Secondary | ICD-10-CM | POA: Diagnosis not present

## 2022-07-07 DIAGNOSIS — I10 Essential (primary) hypertension: Secondary | ICD-10-CM | POA: Diagnosis present

## 2022-07-07 DIAGNOSIS — I959 Hypotension, unspecified: Secondary | ICD-10-CM | POA: Diagnosis present

## 2022-07-07 DIAGNOSIS — R000 Tachycardia, unspecified: Secondary | ICD-10-CM | POA: Diagnosis present

## 2022-07-07 DIAGNOSIS — E559 Vitamin D deficiency, unspecified: Secondary | ICD-10-CM | POA: Diagnosis present

## 2022-07-07 HISTORY — DX: Post-traumatic stress disorder, chronic: F43.12

## 2022-07-07 LAB — BASIC METABOLIC PANEL
Anion gap: 8 (ref 5–15)
Anion gap: 10 (ref 5–15)
BUN: 20 mg/dL (ref 8–23)
BUN: 19 mg/dL (ref 8–23)
CO2: 23 mmol/L (ref 22–32)
CO2: 20 mmol/L — ABNORMAL LOW (ref 22–32)
Calcium: 8 mg/dL — ABNORMAL LOW (ref 8.9–10.3)
Calcium: 8.3 mg/dL — ABNORMAL LOW (ref 8.9–10.3)
Chloride: 103 mmol/L (ref 98–111)
Chloride: 107 mmol/L (ref 98–111)
Creatinine, Ser: 0.81 mg/dL (ref 0.61–1.24)
Creatinine, Ser: 0.85 mg/dL (ref 0.61–1.24)
GFR, Estimated: >60 mL/min (ref >60)
GFR, Estimated: >60 mL/min (ref >60)
Glucose, Bld: 272 mg/dL — ABNORMAL HIGH (ref 70–99)
Glucose, Bld: 180 mg/dL — ABNORMAL HIGH (ref 70–99)
Potassium: 4 mmol/L (ref 3.5–5.1)
Potassium: 4.5 mmol/L (ref 3.5–5.1)
Sodium: 134 mmol/L — ABNORMAL LOW (ref 135–145)
Sodium: 137 mmol/L (ref 135–145)

## 2022-07-07 LAB — CBC WITH DIFFERENTIAL/PLATELET
Abs Immature Granulocytes: 0.05 10*3/uL (ref 0.00–0.07)
Basophils Absolute: 0 10*3/uL (ref 0.0–0.1)
Basophils Relative: 0 %
Eosinophils Absolute: 0.2 10*3/uL (ref 0.0–0.5)
Eosinophils Relative: 2 %
HCT: 22.1 % — ABNORMAL LOW (ref 39.0–52.0)
Hemoglobin: 7.3 g/dL — ABNORMAL LOW (ref 13.0–17.0)
Immature Granulocytes: 1 %
Lymphocytes Relative: 28 %
Lymphs Abs: 2 10*3/uL (ref 0.7–4.0)
MCH: 29.2 pg (ref 26.0–34.0)
MCHC: 33 g/dL (ref 30.0–36.0)
MCV: 88.4 fL (ref 80.0–100.0)
Monocytes Absolute: 0.5 10*3/uL (ref 0.1–1.0)
Monocytes Relative: 6 %
Neutro Abs: 4.4 10*3/uL (ref 1.7–7.7)
Neutrophils Relative %: 63 %
Platelets: 305 10*3/uL (ref 150–400)
RBC: 2.5 MIL/uL — ABNORMAL LOW (ref 4.22–5.81)
RDW: 16.2 % — ABNORMAL HIGH (ref 11.5–15.5)
WBC: 7 10*3/uL (ref 4.0–10.5)
nRBC: 0.6 % — ABNORMAL HIGH (ref 0.0–0.2)

## 2022-07-07 LAB — RETICULOCYTES
Immature Retic Fract: 28.7 % — ABNORMAL HIGH (ref 2.3–15.9)
RBC.: 2.38 MIL/uL — ABNORMAL LOW (ref 4.22–5.81)
Retic Count, Absolute: 195.4 10*3/uL — ABNORMAL HIGH (ref 19.0–186.0)
Retic Ct Pct: 8.2 % — ABNORMAL HIGH (ref 0.4–3.1)

## 2022-07-07 LAB — GLUCOSE, CAPILLARY
Glucose-Capillary: 139 mg/dL — ABNORMAL HIGH (ref 70–99)
Glucose-Capillary: 176 mg/dL — ABNORMAL HIGH (ref 70–99)
Glucose-Capillary: 207 mg/dL — ABNORMAL HIGH (ref 70–99)
Glucose-Capillary: 258 mg/dL — ABNORMAL HIGH (ref 70–99)
Glucose-Capillary: 270 mg/dL — ABNORMAL HIGH (ref 70–99)
Glucose-Capillary: 316 mg/dL — ABNORMAL HIGH (ref 70–99)

## 2022-07-07 LAB — BLOOD GAS, ARTERIAL
Acid-Base Excess: 0 mmol/L (ref 0.0–2.0)
Bicarbonate: 24.8 mmol/L (ref 20.0–28.0)
O2 Saturation: 98.6 %
Patient temperature: 36.7
pCO2 arterial: 39 mmHg (ref 32–48)
pH, Arterial: 7.4 (ref 7.35–7.45)
pO2, Arterial: 94 mmHg (ref 83–108)

## 2022-07-07 LAB — IRON AND TIBC
Iron: 57 ug/dL (ref 45–182)
Saturation Ratios: 19 % (ref 17.9–39.5)
TIBC: 305 ug/dL (ref 250–450)
UIBC: 248 ug/dL

## 2022-07-07 LAB — ABO/RH: ABO/RH(D): B POS

## 2022-07-07 LAB — BRAIN NATRIURETIC PEPTIDE: B Natriuretic Peptide: 87.5 pg/mL (ref 0.0–100.0)

## 2022-07-07 LAB — TYPE AND SCREEN
ABO/RH(D): B POS
Antibody Screen: NEGATIVE

## 2022-07-07 LAB — FERRITIN: Ferritin: 166 ng/mL (ref 24–336)

## 2022-07-07 LAB — VITAMIN B12: Vitamin B-12: 488 pg/mL (ref 180–914)

## 2022-07-07 LAB — PROCALCITONIN: Procalcitonin: 0.27 ng/mL

## 2022-07-07 LAB — FOLATE: Folate: 9.6 ng/mL (ref >5.9)

## 2022-07-07 LAB — MAGNESIUM: Magnesium: 2.2 mg/dL (ref 1.7–2.4)

## 2022-07-07 LAB — C-REACTIVE PROTEIN: CRP: 1.2 mg/dL — ABNORMAL HIGH (ref <1.0)

## 2022-07-07 MED ORDER — FREE WATER
100.0000 mL | Freq: Four times a day (QID) | Status: DC
  Administered 2022-07-07 – 2022-07-12 (×17): 100 mL

## 2022-07-07 MED ORDER — JUVEN PO PACK
1.0000 | PACK | Freq: Two times a day (BID) | ORAL | Status: DC
  Administered 2022-07-08: 1
  Filled 2022-07-07 (×3): qty 1

## 2022-07-07 MED ORDER — ACETAMINOPHEN 325 MG PO TABS
325.0000 mg | ORAL_TABLET | ORAL | Status: DC | PRN
  Administered 2022-07-07 – 2022-07-10 (×2): 650 mg via ORAL
  Filled 2022-07-07 (×2): qty 2

## 2022-07-07 MED ORDER — FLEET ENEMA 7-19 GM/118ML RE ENEM: 1.0000 | ENEMA | Freq: Once | RECTAL | Status: DC | PRN

## 2022-07-07 MED ORDER — MELATONIN 5 MG PO TABS
5.0000 mg | ORAL_TABLET | Freq: Once | ORAL | Status: AC
  Administered 2022-07-07: 5 mg via ORAL

## 2022-07-07 MED ORDER — FREE WATER: 100.0000 mL | Freq: Four times a day (QID) | Status: DC

## 2022-07-07 MED ORDER — PYRIDOSTIGMINE BROMIDE 60 MG PO TABS
60.0000 mg | ORAL_TABLET | Freq: Three times a day (TID) | ORAL | Status: DC
  Administered 2022-07-07 – 2022-07-12 (×15): 60 mg
  Filled 2022-07-07 (×17): qty 1

## 2022-07-07 MED ORDER — INSULIN ASPART 100 UNIT/ML FLEXPEN: PEN_INJECTOR | SUBCUTANEOUS | 0 refills | Status: DC

## 2022-07-07 MED ORDER — PAROXETINE HCL 20 MG PO TABS
20.0000 mg | ORAL_TABLET | Freq: Every day | ORAL | Status: DC
  Administered 2022-07-08 – 2022-07-18 (×11): 20 mg via ORAL
  Filled 2022-07-07 (×11): qty 1

## 2022-07-07 MED ORDER — DIPHENHYDRAMINE HCL 25 MG PO CAPS: 25.0000 mg | ORAL_CAPSULE | Freq: Four times a day (QID) | ORAL | Status: DC | PRN

## 2022-07-07 MED ORDER — PREDNISONE 10 MG PO TABS
10.0000 mg | ORAL_TABLET | Freq: Every day | ORAL | Status: DC
  Administered 2022-07-08 – 2022-07-12 (×5): 10 mg
  Filled 2022-07-07 (×5): qty 1

## 2022-07-07 MED ORDER — MELATONIN 5 MG PO TABS
5.0000 mg | ORAL_TABLET | Freq: Every evening | ORAL | Status: DC | PRN
  Filled 2022-07-07: qty 1

## 2022-07-07 MED ORDER — OSMOLITE 1.5 CAL PO LIQD
1000.0000 mL | ORAL | Status: DC
  Filled 2022-07-07: qty 1000

## 2022-07-07 MED ORDER — PROSOURCE TF20 ENFIT COMPATIBL EN LIQD
60.0000 mL | Freq: Every day | ENTERAL | Status: DC
  Administered 2022-07-07 – 2022-07-12 (×6): 60 mL
  Filled 2022-07-07 (×6): qty 60

## 2022-07-07 MED ORDER — PAROXETINE HCL 20 MG PO TABS
20.0000 mg | ORAL_TABLET | Freq: Every day | ORAL | Status: DC
  Administered 2022-07-07: 20 mg via ORAL
  Filled 2022-07-07: qty 1

## 2022-07-07 MED ORDER — INSULIN ASPART 100 UNIT/ML IJ SOLN
0.0000 [IU] | Freq: Three times a day (TID) | INTRAMUSCULAR | Status: DC
  Administered 2022-07-07: 2 [IU] via SUBCUTANEOUS
  Administered 2022-07-08: 5 [IU] via SUBCUTANEOUS
  Administered 2022-07-08: 1 [IU] via SUBCUTANEOUS
  Administered 2022-07-08 – 2022-07-09 (×2): 3 [IU] via SUBCUTANEOUS
  Administered 2022-07-09: 1 [IU] via SUBCUTANEOUS
  Administered 2022-07-09: 2 [IU] via SUBCUTANEOUS
  Administered 2022-07-10: 1 [IU] via SUBCUTANEOUS
  Administered 2022-07-10 (×2): 3 [IU] via SUBCUTANEOUS
  Administered 2022-07-11: 1 [IU] via SUBCUTANEOUS
  Administered 2022-07-11: 2 [IU] via SUBCUTANEOUS
  Administered 2022-07-11: 3 [IU] via SUBCUTANEOUS
  Administered 2022-07-12: 2 [IU] via SUBCUTANEOUS
  Administered 2022-07-12: 1 [IU] via SUBCUTANEOUS
  Administered 2022-07-13 (×2): 3 [IU] via SUBCUTANEOUS
  Administered 2022-07-14: 1 [IU] via SUBCUTANEOUS
  Administered 2022-07-14: 3 [IU] via SUBCUTANEOUS
  Administered 2022-07-15: 1 [IU] via SUBCUTANEOUS
  Administered 2022-07-15 (×2): 3 [IU] via SUBCUTANEOUS
  Administered 2022-07-16: 1 [IU] via SUBCUTANEOUS
  Administered 2022-07-16 – 2022-07-17 (×2): 2 [IU] via SUBCUTANEOUS
  Administered 2022-07-17: 1 [IU] via SUBCUTANEOUS
  Administered 2022-07-18: 5 [IU] via SUBCUTANEOUS

## 2022-07-07 MED ORDER — PANTOPRAZOLE SODIUM 40 MG PO TBEC
40.0000 mg | DELAYED_RELEASE_TABLET | Freq: Two times a day (BID) | ORAL | Status: DC
  Administered 2022-07-07 – 2022-07-18 (×22): 40 mg via ORAL
  Filled 2022-07-07 (×22): qty 1

## 2022-07-07 MED ORDER — CAMPHOR-MENTHOL 0.5-0.5 % EX LOTN
TOPICAL_LOTION | CUTANEOUS | Status: DC | PRN
  Filled 2022-07-07: qty 222

## 2022-07-07 MED ORDER — ALPRAZOLAM 0.5 MG PO TABS: 0.5000 mg | ORAL_TABLET | Freq: Three times a day (TID) | ORAL | 0 refills | Status: DC | PRN

## 2022-07-07 MED ORDER — MAGIC MOUTHWASH W/LIDOCAINE
5.0000 mL | Freq: Four times a day (QID) | ORAL | Status: DC
  Administered 2022-07-07 – 2022-07-18 (×40): 5 mL via ORAL
  Filled 2022-07-07 (×45): qty 5

## 2022-07-07 MED ORDER — INSULIN ASPART 100 UNIT/ML IJ SOLN
0.0000 [IU] | Freq: Every day | INTRAMUSCULAR | Status: DC
  Administered 2022-07-07: 3 [IU] via SUBCUTANEOUS
  Administered 2022-07-10 – 2022-07-11 (×2): 2 [IU] via SUBCUTANEOUS

## 2022-07-07 MED ORDER — INSULIN ASPART 100 UNIT/ML IJ SOLN: 0.0000 [IU] | INTRAMUSCULAR | Status: DC

## 2022-07-07 MED ORDER — ENOXAPARIN SODIUM 40 MG/0.4ML IJ SOSY: 40.0000 mg | PREFILLED_SYRINGE | INTRAMUSCULAR | Status: DC

## 2022-07-07 MED ORDER — OSMOLITE 1.5 CAL PO LIQD
600.0000 mL | ORAL | Status: DC
  Administered 2022-07-07 – 2022-07-11 (×5): 600 mL
  Filled 2022-07-07 (×5): qty 1000

## 2022-07-07 MED ORDER — GUAIFENESIN-DM 100-10 MG/5ML PO SYRP: 5.0000 mL | ORAL_SOLUTION | Freq: Four times a day (QID) | ORAL | Status: DC | PRN

## 2022-07-07 MED ORDER — ALUM & MAG HYDROXIDE-SIMETH 200-200-20 MG/5ML PO SUSP: 30.0000 mL | ORAL | Status: DC | PRN

## 2022-07-07 MED ORDER — DILTIAZEM HCL 60 MG PO TABS: 60.0000 mg | ORAL_TABLET | Freq: Three times a day (TID) | ORAL | Status: DC

## 2022-07-07 MED ORDER — ALPRAZOLAM 0.5 MG PO TABS
0.5000 mg | ORAL_TABLET | Freq: Three times a day (TID) | ORAL | Status: DC | PRN
  Administered 2022-07-07 – 2022-07-17 (×14): 0.5 mg via ORAL
  Filled 2022-07-07 (×14): qty 1

## 2022-07-07 MED ORDER — PROCHLORPERAZINE MALEATE 5 MG PO TABS: 5.0000 mg | ORAL_TABLET | Freq: Four times a day (QID) | ORAL | Status: DC | PRN

## 2022-07-07 MED ORDER — BISACODYL 10 MG RE SUPP: 10.0000 mg | Freq: Every day | RECTAL | Status: DC | PRN

## 2022-07-07 MED ORDER — ENSURE ENLIVE PO LIQD: 237.0000 mL | Freq: Three times a day (TID) | ORAL | 12 refills | Status: DC

## 2022-07-07 MED ORDER — PROCHLORPERAZINE 25 MG RE SUPP: 12.5000 mg | Freq: Four times a day (QID) | RECTAL | Status: DC | PRN

## 2022-07-07 MED ORDER — PROCHLORPERAZINE EDISYLATE 10 MG/2ML IJ SOLN: 5.0000 mg | Freq: Four times a day (QID) | INTRAMUSCULAR | Status: DC | PRN

## 2022-07-07 MED ORDER — ENOXAPARIN SODIUM 40 MG/0.4ML IJ SOSY
40.0000 mg | PREFILLED_SYRINGE | INTRAMUSCULAR | Status: DC
  Administered 2022-07-07 – 2022-07-08 (×2): 40 mg via SUBCUTANEOUS
  Filled 2022-07-07 (×2): qty 0.4

## 2022-07-07 MED ORDER — INSULIN ASPART 100 UNIT/ML IJ SOLN: 0.0000 [IU] | Freq: Three times a day (TID) | INTRAMUSCULAR | Status: DC

## 2022-07-07 MED ORDER — ORAL CARE MOUTH RINSE
15.0000 mL | OROMUCOSAL | Status: DC
  Administered 2022-07-07 – 2022-07-18 (×31): 15 mL via OROMUCOSAL

## 2022-07-07 MED ORDER — CLONAZEPAM 0.5 MG PO TABS
1.0000 mg | ORAL_TABLET | Freq: Two times a day (BID) | ORAL | Status: DC
  Administered 2022-07-07 – 2022-07-08 (×2): 1 mg via ORAL
  Filled 2022-07-07 (×2): qty 2

## 2022-07-07 MED ORDER — ENSURE ENLIVE PO LIQD: 237.0000 mL | Freq: Three times a day (TID) | ORAL | Status: DC

## 2022-07-07 MED ORDER — PANTOPRAZOLE SODIUM 40 MG PO TBEC: 40.0000 mg | DELAYED_RELEASE_TABLET | Freq: Every day | ORAL | Status: DC

## 2022-07-07 MED ORDER — VITAMIN D 25 MCG (1000 UNIT) PO TABS
2000.0000 [IU] | ORAL_TABLET | Freq: Every day | ORAL | Status: DC
  Administered 2022-07-08 – 2022-07-18 (×11): 2000 [IU] via ORAL
  Filled 2022-07-07 (×11): qty 2

## 2022-07-07 MED ORDER — DILTIAZEM HCL 60 MG PO TABS
60.0000 mg | ORAL_TABLET | Freq: Three times a day (TID) | ORAL | Status: DC
  Administered 2022-07-07: 60 mg via NASOGASTRIC
  Filled 2022-07-07: qty 1

## 2022-07-07 MED ORDER — DILTIAZEM HCL 30 MG PO TABS
60.0000 mg | ORAL_TABLET | Freq: Three times a day (TID) | ORAL | Status: DC
  Administered 2022-07-07 – 2022-07-12 (×15): 60 mg via NASOGASTRIC
  Filled 2022-07-07 (×16): qty 2

## 2022-07-07 MED ORDER — PAROXETINE HCL 20 MG PO TABS: 20.0000 mg | ORAL_TABLET | Freq: Every day | ORAL | Status: DC

## 2022-07-07 MED ORDER — LORAZEPAM 2 MG/ML IJ SOLN
0.5000 mg | INTRAMUSCULAR | Status: AC
  Administered 2022-07-07: 0.5 mg via INTRAVENOUS
  Filled 2022-07-07: qty 1

## 2022-07-07 MED ORDER — EMTRICITABINE-TENOFOVIR AF 200-25 MG PO TABS
1.0000 | ORAL_TABLET | Freq: Every day | ORAL | Status: DC
  Administered 2022-07-08 – 2022-07-18 (×11): 1 via ORAL
  Filled 2022-07-07 (×11): qty 1

## 2022-07-07 MED ORDER — CLONAZEPAM 1 MG PO TABS: 1.0000 mg | ORAL_TABLET | Freq: Two times a day (BID) | ORAL | 0 refills | Status: DC

## 2022-07-07 NOTE — Progress Notes (Signed)
Inpatient Rehabilitation Admission Medication Review by a Pharmacist  A complete drug regimen review was completed for this patient to identify any potential clinically significant medication issues.  High Risk Drug Classes Is patient taking? Indication by Medication  Antipsychotic Yes Compazine - nausea  Anticoagulant Yes Lovenox - VTE prophylaxis  Antibiotic No   Opioid No   Antiplatelet No   Hypoglycemics/insulin Yes SSI - DM  Vasoactive Medication Yes Diltiazem - hypertension and tachycardia  Chemotherapy No   Other Yes Xanax, ativan, Paxil - anxiety Descovy - Hep B Hx Protonix - GERD Prednisone, Pyridostigmine - Myasthenia Gravis Apap - pain Benadryl - itching Dulcolax, fleet - constipation Mylanta - indigestion Robitussin - cough      Type of Medication Issue Identified Description of Issue Recommendation(s)  Drug Interaction(s) (clinically significant)     Duplicate Therapy     Allergy     No Medication Administration End Date     Incorrect Dose     Additional Drug Therapy Needed     Significant med changes from prior encounter (inform family/care partners about these prior to discharge).  Communicate medication changes with patient/family at discharge  Other  Using Descovy in place of Entecavir d/t availability     Clinically significant medication issues were identified that warrant physician communication and completion of prescribed/recommended actions by midnight of the next day:  No  Name of provider notified for urgent issues identified:    Provider Method of Notification:    Pharmacist comments:   Time spent performing this drug regimen review (minutes): 30   Thank you for allowing pharmacy to be a part of this patient's care.   Signe Colt, PharmD 07/07/2022 3:14 PM  **Pharmacist phone directory can be found on [email or web address] listed under [facility]**

## 2022-07-07 NOTE — Discharge Instructions (Signed)
Follow with Primary MD [place], [name], MD in 7 days   Get CBC, CMP, 2 view Chest X ray -  checked next visit with your primary MD or CIR MD    Activity: As tolerated with Full fall precautions use walker/cane & assistance as needed  Disposition CIR  Diet: Dysphagia 3 diet with feeding assistance and aspiration precautions, continue core track tube feeds.  Special Instructions: If you have smoked or chewed Tobacco  in the last 2 yrs please stop smoking, stop any regular Alcohol  and or any Recreational drug use.  On your next visit with your primary care physician please Get Medicines reviewed and adjusted.  Please request your Prim.MD to go over all Hospital Tests and Procedure/Radiological results at the follow up, please get all Hospital records sent to your Prim MD by signing hospital release before you go home.  If you experience worsening of your admission symptoms, develop shortness of breath, life threatening emergency, suicidal or homicidal thoughts you must seek medical attention immediately by calling 911 or calling your MD immediately  if symptoms less severe.  You Must read complete instructions/literature along with all the possible adverse reactions/side effects for all the Medicines you take and that have been prescribed to you. Take any new Medicines after you have completely understood and accpet all the possible adverse reactions/side effects.

## 2022-07-07 NOTE — TOC Transition Note (Signed)
Transition of Care [facility]) - CM/SW Discharge Note   Patient Details  Name: [name] MRN: [id number] Date of Birth: 07/15/1955  [facility] CM/SW Contact:  Mearl Latin, LCSW Phone Number: 07/07/2022, 10:25 AM   Clinical Narrative:    Patient discharging to CIR today. No further TOC needs identified.   Final next level of care: IP Rehab Facility Barriers to Discharge: Barriers Resolved   Patient Goals and CMS Choice      Discharge Placement                         Discharge Plan and Services Additional resources added to the After Visit Summary for       Post Acute Care Choice: IP Rehab                               Social Determinants of Health (SDOH) Interventions SDOH Screenings   Food Insecurity: Patient Unable To Answer (06/22/2022)  Recent Concern: Food Insecurity - Food Insecurity Present (06/16/2022)  Housing: Patient Unable To Answer (06/22/2022)  Transportation Needs: Patient Unable To Answer (06/22/2022)  Utilities: Patient Unable To Answer (06/22/2022)  Depression (PHQ2-9): High Risk (06/21/2022)  Tobacco Use: Low Risk  (07/06/2022)     Readmission Risk Interventions     No data to display

## 2022-07-07 NOTE — H&P (Signed)
Physical Medicine and Rehabilitation Admission H&P        Chief Complaint  Patient presents with   MG flare      HPI: [name] is a 67 year old [name] male with history of  T2DM, hepatitis B, HTN, anxiety d/o,  malignant thymoma s/p resection, MG with recent flare with RUE weakness, difficulty swallowing, SOB, and head droop treated with IVIG X  3 doses with improvement in NIF from -20, VC 2.9L to NIF -30, VC 3.7L and d/c to home on 06/19/22.  He was readmitted on 06/21/22 with difficulty swallowing, SOB and difficulty walking due to DOE. RR 34 at admission with NIF -19. He was intubated in ED, treated with IV Mg with no plans initially to repeat IVIG or PLEX. CTA chest 05/22 was negative for PE or acute findings and showed 40 mm dilatation of ascending aorta (yearly imaging recommended). He was extubated on 05/24 but developed tachycardia with elevated BP,  started on Cardizem drip (BB contraindicated in setting of MG), failed trial of BIPAP with mild hypercarbia requiring reintubation. 2D echo done revealing EF 60-65% with mild concentric LVH and He was started on IVIG X 5 days . Hospital course significant for fever T-101 on 05/25 and BC X 2 negative.  He tolerated extubation on 06/26/22 but later that evening developed SVT with HR in 150-160, SBP 240's and difficulty coughing up excessive secretions. Chest PT added, started on nicardipine drip and required re-intubation. Drop in BP treated with Neo and Klonopin added to help manage anxiety. neo reintubated.    He developed RUE weakness on 05/28 and MRI brain performed which was negative for acute or subacute infarct. Levophed d/c on 05/29 due to RUE edema from infiltration of IV which was treated with local measures. He was extubated to  4 L oxygen by 05/30 and weaned off. He continued to have issues with tachycardia and copious secretion but had high levels of anxiety with chest PT as well as issues with confusion. MBS showed mild to  moderate oropharyngeal dysphagia with moderate residue (worse with nectar), one trace instance of frank penetration but ability to protect airway. He was started on D3, thins but intake is poor due to reports of sore throat as well as feeling of being choked.  He required IV Cardizem for tachycardia on 06/01 and reports panicked feeling with this causing SOB-->klonopin increased to 0.5 mg bid.     Due to concerns of exacerbating MG, Cardizem was discontinued on 06/04 due to concerns of exacerbating MG but patient developed tachypnea with tachycardia and HR in 120's on 06/05 with question of anxiety v/s PE.  He was treated with IVF, Klonopin titrated to 1 mg and Paxil added in addition to prn use of ativan. CXR negative and CTA chest negative for PE.BLE dopplers negative for DVTMentation has improved but intake remains poor with tube feed ongoing at nights. NIF -40, VC-1.2 L this am. Therapy has been working with patient who continues to be limited by weakness with balance deficits, poor safety awareness and gait disorder. CIR recommended due to functional decline.       Review of Systems  Constitutional:  Negative for chills and fever.  HENT:  Positive for sore throat (due to intubation/Food getting stuck).   Eyes:  Positive for blurred vision (at times).  Respiratory:  Positive for cough and shortness of breath.   Cardiovascular:  Positive for palpitations (at times lead to SOB).  Gastrointestinal:  Negative for constipation and heartburn.  Genitourinary:  Positive for frequency.  Musculoskeletal:  Negative for myalgias.  Neurological:  Positive for weakness. Negative for dizziness.  Psychiatric/Behavioral:  The patient is nervous/anxious and has insomnia.           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 No pertinent family history.     Social History:  Married. Used to work for [employer] --disabled since [employer] diagnosis. He  reports that he has never smoked. He has never used smokeless tobacco. He reports that he does not drink alcohol and does not use drugs.        Allergies  Allergen Reactions   Azithromycin Other (See Comments)      REACTION: exac of his mg   Beta Adrenergic Blockers Other (See Comments)      REACTION: exac of mg   Calcium Channel Blockers Other (See Comments)      Medications that may worsen or trigger MG exacerbation: Class IA antiarrhythmics, magnesium, flouroquinolones, macrolides, aminoglycosides, penicillamine, curare, interferon alpha, botox, quinine. Use with caution: calcium channel blockers, beta blockers and statins.    Macrolides And Ketolides Other (See Comments)      Medications that may worsen or trigger MG exacerbation: Class IA antiarrhythmics, magnesium, flouroquinolones, macrolides, aminoglycosides, penicillamine, curare, interferon alpha, botox, quinine. Use with caution: calcium channel blockers, beta blockers and statins.    Statins Other (See Comments)      Medications that may worsen or trigger MG exacerbation: Class IA antiarrhythmics, magnesium, flouroquinolones, macrolides, aminoglycosides, penicillamine, curare, interferon alpha, botox, quinine. Use with caution: calcium channel blockers, beta blockers and statins.           Medications Prior to Admission  Medication Sig Dispense Refill    ALPRAZolam (XANAX) 0.5 MG tablet Take 1 tablet (0.5 mg total) by mouth 3 (three) times daily as needed for anxiety.   0   calcium carbonate (OS-CAL) 600 MG TABS Take 600 mg by mouth 3 (three) times daily with meals.       Cholecalciferol (VITAMIN D) 2000 UNITS tablet Take 2,000 Units by mouth daily.       clonazePAM (KLONOPIN) 1 MG tablet Take 1 tablet (1 mg total) by mouth 2 (two) times daily. 30 tablet 0   colesevelam (WELCHOL) 625 MG tablet Take 3 tablets (1,875 mg total) by mouth daily. 270 tablet 0   desonide (DESONATE) 0.05 % gel Apply 1 application topically 2 (two) times daily as needed.        diltiazem (CARDIZEM) 60 MG tablet 1 tablet (60 mg total) by Per NG tube route every 8 (eight) hours.       entecavir (BARACLUDE) 1 MG tablet Take  1 tablet (1 mg total) by mouth daily. 30 tablet 6   feeding supplement (ENSURE ENLIVE / ENSURE PLUS) LIQD Take 237 mLs by mouth 3 (three) times daily between meals. 237 mL 12   glucose blood (ONETOUCH ULTRA) test strip USE TO TEST BLOOD SUGAR 3 TIMES DAILY 100 strip 11   insulin aspart (NOVOLOG) 100 UNIT/ML FlexPen Before each meal 3 times a day, 140-199 - 2 units, 200-250 - 4 units, 251-299 - 6 units,  300-349 - 8 units,  350 or above 10 units. 15 mL 0   Lancets (ONETOUCH ULTRASOFT) lancets USE AS INSTRUCTED TO CHECK BLOOD SUGARS THREE TIMES A DAY 200 each 3   pantoprazole (PROTONIX) 40 MG tablet Take 1 tablet (40 mg total) by mouth daily.       [START ON 07/08/2022] PARoxetine (PAXIL) 20 MG tablet Take 1 tablet (20 mg total) by mouth daily.       pimecrolimus (ELIDEL) 1 % cream Apply 1 application topically 2 (two) times daily as needed.        predniSONE (DELTASONE) 5 MG tablet Take 10 mg by mouth as directed.       pyridostigmine (MESTINON) 60 MG tablet Take 60 mg by mouth 3 (three) times daily.       Water For Irrigation, Sterile (FREE WATER) SOLN Place 100 mLs into feeding tube every 6 (six) hours.              Home: Home Living Family/patient  expects to be discharged to:: Private residence Living Arrangements: Spouse/significant other Available Help at Discharge: Family Type of Home: House Home Access: Level entry Entrance Stairs-Number of Steps: 0 Entrance Stairs-Rails: None Home Layout: One level Bathroom Shower/Tub: Associate Professor: Yes Home Equipment: None  Lives With: Spouse   Functional History: Prior Function Prior Level of Function : Independent/Modified Independent Mobility Comments: Retired ADLs Comments: Could complete even with most recent exacerbation   Functional Status:  Mobility: Bed Mobility Overal bed mobility: Needs Assistance Bed Mobility: Supine to Sit, Sit to Supine Supine to sit: Min assist Sit to supine: Mod assist General bed mobility comments: min A to elevate trunk Transfers Overall transfer level: Needs assistance Equipment used: Rolling walker (2 wheels) Transfers: Sit to/from Stand Sit to Stand: Min assist, Min guard Bed to/from chair/wheelchair/BSC transfer type:: Step pivot Squat pivot transfers: Total assist Step pivot transfers: Min assist, +2 physical assistance General transfer comment: min A to rise initially with pt demonstrating good carryover for hand placement, min guard from chair x3 Ambulation/Gait Ambulation/Gait assistance: Min assist Gait Distance (Feet): 30 Feet Assistive device: Rolling walker (2 wheels) Gait Pattern/deviations: Step-through pattern, Decreased stride length General Gait Details: slowed step-through gait with min A to steady pt with improved RW management this session, pt limtied by fatigue this session, needing x1 seated rest Gait velocity: decr   ADL: ADL Overall ADL's : Needs assistance/impaired Eating/Feeding: Minimal assistance, Set up, Bed level Eating/Feeding Details (indicate cue type and reason): RN clearing for ice chips as pt no longer NPO. Requirin max positioning in bed and lowering  of table to self feed ice chips with spoon. Grooming: Bed level, Wash/dry face, Wash/dry hands Grooming Details (indicate cue type and reason): upright chair position in bed, pt washing face with setup given increased time Upper Body Bathing: Minimal assistance, Sitting Lower Body Bathing: Minimal assistance, Bed level, Sitting/lateral leans Lower Body Bathing Details (indicate cue type and reason): upright in bed, applying lotion to BLEs with  increased time Upper Body Dressing : Minimal assistance, Sitting Upper Body Dressing Details (indicate cue type and reason): new gown Lower Body Dressing: Maximal assistance, Sit to/from stand, +2 for physical assistance, +2 for safety/equipment Toilet Transfer: Minimal assistance Toilet Transfer Details (indicate cue type and reason): on bed pan when OT entered room Toileting- Clothing Manipulation and Hygiene: Total assistance, Bed level Toileting - Clothing Manipulation Details (indicate cue type and reason): for posterior pericare after BM. Male purewick on throughout session Functional mobility during ADLs: Minimal assistance, Moderate assistance, Rolling walker (2 wheels) General ADL Comments: cues for safety. Poor activity tolerance.   Cognition: Cognition Overall Cognitive Status: Impaired/Different from baseline Orientation Level: Oriented X4 Cognition Arousal/Alertness: Awake/alert Behavior During Therapy: Flat affect Overall Cognitive Status: Impaired/Different from baseline Area of Impairment: Safety/judgement, Awareness, Problem solving Safety/Judgement: Decreased awareness of safety Awareness: Emergent, Anticipatory Problem Solving: Slow processing General Comments: following commands today, limited session due to departure to [place] Difficult to assess due to: Non-English speaking (but also speaks [place]. [name] is native language)   Physical Exam: Blood pressure 94/61, pulse (!) 102, temperature 97.7 F (36.5 C), temperature  source Oral, resp. rate 20, height 5\' 5"  (1.651 m), weight 53.8 kg, SpO2 99 %. Physical Exam  Constitutional: No apparent distress. Cachectic.  HENT: No JVD. Neck Supple. Trachea midline. Atraumatic, normocephalic. +Cortrak Eyes: PERRLA. EOMI.  Cardiovascular: Tachycardic 100s, regular rhythm, no murmurs/rub/gallops. No Edema. Peripheral pulses 2+  Respiratory: CTAB. No rales, rhonchi, or wheezing. On RA.  Abdomen: + bowel sounds, normoactive. No distention or tenderness.  GU: Not examined. + condom cath, draining clear urine.  Skin:Large ecchymotic area LLQ and multiple small bruises from heparin injection.   MSK:      No apparent deformity.      Strength:                RUE: 4/5 throughout                LUE: 4/5 throughout                RLE: 3/5 throughout                LLE:  3/5 throughout  Neurologic exam:  Cognition: AAO to person, place, time and event.  Language: Fluent, No substitutions or neoglisms. + mild dysarthria. Names 3/3 objects correctly.  Memory: Recalls 3/3 objects at 5 minutes. Cannot add change. Cannot say months of the year in reverse.  Insight: Good insight into current condition.  Mood: Anxious mood.  Sensation: To light touch intact in BL UEs and LEs  Reflexes: 1+ in BL UE and LEs. Negative Hoffman's and babinski signs bilaterally.  CN: + R ptosis. + mild R facial droop Coordination: No apparent tremors. No ataxia on FTN, HTS bilaterally.   Spasticity: MAS 0 in all extremities.    Lab Results Last 48 Hours        Results for orders placed or performed during the hospital encounter of 06/21/22 (from the past 48 hour(s))  Glucose, capillary     Status: Abnormal    Collection Time: 07/05/22  7:38 PM  Result Value Ref Range    Glucose-Capillary 203 (H) 70 - 99 mg/dL      Comment: Glucose reference range applies only to samples taken after fasting for at least 8 hours.  Glucose, capillary     Status: Abnormal    Collection Time: 07/05/22 11:02 PM   Result Value Ref Range    Glucose-Capillary  221 (H) 70 - 99 mg/dL      Comment: Glucose reference range applies only to samples taken after fasting for at least 8 hours.  Blood gas, arterial     Status: Abnormal    Collection Time: 07/05/22 11:16 PM  Result Value Ref Range    pH, Arterial 7.45 7.35 - 7.45    pCO2 arterial 41 32 - 48 mmHg    pO2, Arterial 82 (L) 83 - 108 mmHg    Bicarbonate 28.5 (H) 20.0 - 28.0 mmol/L    Acid-Base Excess 4.1 (H) 0.0 - 2.0 mmol/L    O2 Saturation 97.1 %    Patient temperature 36.8      Collection site LEFT RADIAL      Allens test (pass/fail) PASS PASS      Comment: Performed at [facility], [address], [place], [place] [address]  Vitamin B12     Status: None    Collection Time: 07/06/22 12:40 AM  Result Value Ref Range    Vitamin B-12 488 180 - 914 pg/mL      Comment: (NOTE) This assay is not validated for testing neonatal or myeloproliferative syndrome specimens for Vitamin B12 levels. Performed at [facility], [address] [place], [place] [address]    CBC with Differential/Platelet     Status: Abnormal    Collection Time: 07/06/22 12:46 AM  Result Value Ref Range    WBC 8.4 4.0 - 10.5 K/uL    RBC 2.79 (L) 4.22 - 5.81 MIL/uL    Hemoglobin 7.9 (L) 13.0 - 17.0 g/dL    HCT 09.8 (L) 11.9 - 52.0 %    MCV 88.2 80.0 - 100.0 fL    MCH 28.3 26.0 - 34.0 pg    MCHC 32.1 30.0 - 36.0 g/dL    RDW 14.7 82.9 - 56.2 %    Platelets 314 150 - 400 K/uL    nRBC 1.0 (H) 0.0 - 0.2 %    Neutrophils Relative % 60 %    Neutro Abs 5.1 1.7 - 7.7 K/uL    Lymphocytes Relative 30 %    Lymphs Abs 2.5 0.7 - 4.0 K/uL    Monocytes Relative 6 %    Monocytes Absolute 0.5 0.1 - 1.0 K/uL    Eosinophils Relative 2 %    Eosinophils Absolute 0.2 0.0 - 0.5 K/uL    Basophils Relative 1 %    Basophils Absolute 0.0 0.0 - 0.1 K/uL    Immature Granulocytes 1 %    Abs Immature Granulocytes 0.10 (H) 0.00 - 0.07 K/uL      Comment: Performed at [facility], [address], [place], [place] [address]  Brain natriuretic peptide     Status: None    Collection Time: 07/06/22 12:46 AM  Result Value Ref Range    B Natriuretic Peptide 55.8 0.0 - 100.0 pg/mL      Comment: Performed at [facility], [address] [place], [place] [address]  Basic metabolic panel     Status: Abnormal    Collection Time: 07/06/22 12:46 AM  Result Value Ref Range    Sodium 136 135 - 145 mmol/L    Potassium 3.6 3.5 - 5.1 mmol/L    Chloride 104 98 - 111 mmol/L    CO2 24 22 - 32 mmol/L    Glucose, Bld 151 (H) 70 - 99 mg/dL      Comment: Glucose reference range applies only to samples  taken after fasting for at least 8 hours.    BUN 30 (H) 8 - 23 mg/dL    Creatinine, Ser 1.61 0.61 - 1.24 mg/dL    Calcium 8.2 (L) 8.9 - 10.3 mg/dL    GFR, Estimated >09 >60 mL/min      Comment: (NOTE) Calculated using the CKD-EPI Creatinine Equation (2021)      Anion gap 8 5 - 15      Comment: Performed at [facility], [address] [place], [place] [address]  Magnesium     Status: None    Collection Time: 07/06/22 12:46 AM  Result Value Ref Range    Magnesium 2.1 1.7 - 2.4 mg/dL      Comment: Performed at [facility], [address], [place], [place] [address]  C-reactive protein     Status: None    Collection Time: 07/06/22 12:46 AM  Result Value Ref Range    CRP 0.6 <1.0 mg/dL      Comment: Performed at [facility], [address], [place], [place] [address]  Procalcitonin     Status: None    Collection Time: 07/06/22 12:46 AM  Result Value Ref Range    Procalcitonin 0.38 ng/mL      Comment:        Interpretation: PCT (Procalcitonin) <= 0.5 ng/mL: Systemic infection (sepsis) is not likely. Local bacterial infection is possible. (NOTE)       Sepsis PCT Algorithm           Lower Respiratory Tract                                      Infection PCT Algorithm    ----------------------------     ----------------------------         PCT <  0.25 ng/mL                PCT < 0.10 ng/mL           Strongly encourage             Strongly discourage   discontinuation of antibiotics    initiation of antibiotics    ----------------------------     -----------------------------       PCT 0.25 - 0.50 ng/mL            PCT 0.10 - 0.25 ng/mL               OR       >80% decrease in PCT            Discourage initiation of                                            antibiotics      Encourage discontinuation           of antibiotics    ----------------------------     -----------------------------         PCT >= 0.50 ng/mL              PCT 0.26 - 0.50 ng/mL               AND        <80% decrease in PCT  Encourage initiation of                                             antibiotics       Encourage continuation           of antibiotics    ----------------------------     -----------------------------        PCT >= 0.50 ng/mL                  PCT > 0.50 ng/mL               AND         increase in PCT                  Strongly encourage                                      initiation of antibiotics    Strongly encourage escalation           of antibiotics                                     -----------------------------                                           PCT <= 0.25 ng/mL                                                 OR                                        > 80% decrease in PCT                                       Discontinue / Do not initiate                                             antibiotics   Performed at [facility], [address] [place], [place] [address]    D-dimer, quantitative     Status: Abnormal    Collection Time: 07/06/22 12:46 AM  Result Value Ref Range    D-Dimer, Quant 2.14 (H) 0.00 - 0.50 ug/mL-FEU      Comment: (NOTE) At the manufacturer cut-off value of 0.5 g/mL FEU, this assay has a negative predictive value of 95-100%.This assay is intended for use in conjunction with a  clinical pretest probability (PTP) assessment model to exclude pulmonary embolism (PE) and deep venous thrombosis (DVT) in outpatients suspected of PE or DVT. Results should be correlated with clinical presentation. Performed at [facility], [address], [place], [place] [address]    Glucose, capillary     Status: Abnormal    Collection Time: 07/06/22  1:23 AM  Result Value Ref Range    Glucose-Capillary 147 (H) 70 - 99 mg/dL      Comment: Glucose reference range applies only to samples taken after fasting for at least 8 hours.  Glucose, capillary     Status: Abnormal    Collection Time: 07/06/22  4:48 AM  Result Value Ref Range    Glucose-Capillary 235 (H) 70 - 99 mg/dL      Comment: Glucose reference range applies only to samples taken after fasting for at least 8 hours.  Glucose, capillary     Status: Abnormal    Collection Time: 07/06/22  7:47 AM  Result Value Ref Range    Glucose-Capillary 188 (H) 70 - 99 mg/dL      Comment: Glucose reference range applies only to samples taken after fasting for at least 8 hours.  Glucose, capillary     Status: Abnormal    Collection Time: 07/06/22  1:27 PM  Result Value Ref Range    Glucose-Capillary 199 (H) 70 - 99 mg/dL      Comment: Glucose reference range applies only to samples taken after fasting for at least 8 hours.  Glucose, capillary     Status: Abnormal    Collection Time: 07/06/22  3:05 PM  Result Value Ref Range    Glucose-Capillary 186 (H) 70 - 99 mg/dL      Comment: Glucose reference range applies only to samples taken after fasting for at least 8 hours.  Glucose, capillary     Status: Abnormal    Collection Time: 07/06/22  7:50 PM  Result Value Ref Range    Glucose-Capillary 234 (H) 70 - 99 mg/dL      Comment: Glucose reference range applies only to samples taken after fasting for at least 8 hours.  Glucose, capillary     Status: Abnormal    Collection Time: 07/07/22 12:42 AM  Result Value Ref Range     Glucose-Capillary 316 (H) 70 - 99 mg/dL      Comment: Glucose reference range applies only to samples taken after fasting for at least 8 hours.  Blood gas, arterial     Status: None    Collection Time: 07/07/22  4:14 AM  Result Value Ref Range    pH, Arterial 7.4 7.35 - 7.45    pCO2 arterial 39 32 - 48 mmHg    pO2, Arterial 94 83 - 108 mmHg    Bicarbonate 24.8 20.0 - 28.0 mmol/L    Acid-Base Excess 0.0 0.0 - 2.0 mmol/L    O2 Saturation 98.6 %    Patient temperature 36.7      Collection site RIGHT RADIAL      Drawn by RACHELLE SISON,RRT      Allens test (pass/fail) PASS PASS      Comment: Performed at [facility], [address] [place], [place] [address]  Glucose, capillary     Status: Abnormal    Collection Time: 07/07/22  4:30 AM  Result Value Ref Range    Glucose-Capillary 270 (H) 70 - 99 mg/dL      Comment: Glucose reference range applies only to samples taken after fasting for at least 8 hours.  CBC with Differential/Platelet     Status: Abnormal    Collection Time: 07/07/22  5:40 AM  Result Value Ref Range    WBC 7.0 4.0 -  10.5 K/uL    RBC 2.50 (L) 4.22 - 5.81 MIL/uL    Hemoglobin 7.3 (L) 13.0 - 17.0 g/dL    HCT 16.1 (L) 09.6 - 52.0 %    MCV 88.4 80.0 - 100.0 fL    MCH 29.2 26.0 - 34.0 pg    MCHC 33.0 30.0 - 36.0 g/dL    RDW 04.5 (H) 40.9 - 15.5 %    Platelets 305 150 - 400 K/uL    nRBC 0.6 (H) 0.0 - 0.2 %    Neutrophils Relative % 63 %    Neutro Abs 4.4 1.7 - 7.7 K/uL    Lymphocytes Relative 28 %    Lymphs Abs 2.0 0.7 - 4.0 K/uL    Monocytes Relative 6 %    Monocytes Absolute 0.5 0.1 - 1.0 K/uL    Eosinophils Relative 2 %    Eosinophils Absolute 0.2 0.0 - 0.5 K/uL    Basophils Relative 0 %    Basophils Absolute 0.0 0.0 - 0.1 K/uL    Immature Granulocytes 1 %    Abs Immature Granulocytes 0.05 0.00 - 0.07 K/uL      Comment: Performed at [facility], [address], [place], [place] [address]  Brain natriuretic peptide     Status: None    Collection  Time: 07/07/22  5:40 AM  Result Value Ref Range    B Natriuretic Peptide 87.5 0.0 - 100.0 pg/mL      Comment: Performed at [facility], [address] [place], [place] [address]  Basic metabolic panel     Status: Abnormal    Collection Time: 07/07/22  5:40 AM  Result Value Ref Range    Sodium 134 (L) 135 - 145 mmol/L    Potassium 4.0 3.5 - 5.1 mmol/L    Chloride 103 98 - 111 mmol/L    CO2 23 22 - 32 mmol/L    Glucose, Bld 272 (H) 70 - 99 mg/dL      Comment: Glucose reference range applies only to samples taken after fasting for at least 8 hours.    BUN 20 8 - 23 mg/dL    Creatinine, Ser 5.62 0.61 - 1.24 mg/dL    Calcium 8.0 (L) 8.9 - 10.3 mg/dL    GFR, Estimated >13 >08 mL/min      Comment: (NOTE) Calculated using the CKD-EPI Creatinine Equation (2021)      Anion gap 8 5 - 15      Comment: Performed at [facility], [address], [place], [place] [address]  Magnesium     Status: None    Collection Time: 07/07/22  5:40 AM  Result Value Ref Range    Magnesium 2.2 1.7 - 2.4 mg/dL      Comment: Performed at [facility], [address] [place], [place] [address]  C-reactive protein     Status: Abnormal    Collection Time: 07/07/22  5:40 AM  Result Value Ref Range    CRP 1.2 (H) <1.0 mg/dL      Comment: Performed at [facility], [address] [place], [place] [address]  Procalcitonin     Status: None    Collection Time: 07/07/22  5:40 AM  Result Value Ref Range    Procalcitonin 0.27 ng/mL      Comment:        Interpretation: PCT (Procalcitonin) <= 0.5 ng/mL: Systemic infection (sepsis) is not likely. Local bacterial infection is possible. (NOTE)  Sepsis PCT Algorithm           Lower Respiratory Tract                                      Infection PCT Algorithm    ----------------------------     ----------------------------         PCT < 0.25 ng/mL                PCT < 0.10 ng/mL           Strongly encourage             Strongly  discourage   discontinuation of antibiotics    initiation of antibiotics    ----------------------------     -----------------------------       PCT 0.25 - 0.50 ng/mL            PCT 0.10 - 0.25 ng/mL               OR       >80% decrease in PCT            Discourage initiation of                                            antibiotics      Encourage discontinuation           of antibiotics    ----------------------------     -----------------------------         PCT >= 0.50 ng/mL              PCT 0.26 - 0.50 ng/mL               AND        <80% decrease in PCT             Encourage initiation of                                             antibiotics       Encourage continuation           of antibiotics    ----------------------------     -----------------------------        PCT >= 0.50 ng/mL                  PCT > 0.50 ng/mL               AND         increase in PCT                  Strongly encourage                                      initiation of antibiotics    Strongly encourage escalation           of antibiotics                                     -----------------------------  PCT <= 0.25 ng/mL                                                 OR                                        > 80% decrease in PCT                                       Discontinue / Do not initiate                                             antibiotics   Performed at [facility], [address], [place], [place] [address]    Glucose, capillary     Status: Abnormal    Collection Time: 07/07/22  7:49 AM  Result Value Ref Range    Glucose-Capillary 207 (H) 70 - 99 mg/dL      Comment: Glucose reference range applies only to samples taken after fasting for at least 8 hours.  Glucose, capillary     Status: Abnormal    Collection Time: 07/07/22 11:38 AM  Result Value Ref Range    Glucose-Capillary 139 (H) 70 - 99 mg/dL      Comment: Glucose reference  range applies only to samples taken after fasting for at least 8 hours.  Type and screen     Status: None    Collection Time: 07/07/22 12:32 PM  Result Value Ref Range    ABO/RH(D) B POS      Antibody Screen NEG      Sample Expiration          07/10/2022,2359 Performed at [facility], [address], [place], [place] [address]    Folate     Status: None    Collection Time: 07/07/22 12:37 PM  Result Value Ref Range    Folate 9.6 >5.9 ng/mL      Comment: Performed at [facility], [address] [place], [place] [address]  Reticulocytes     Status: Abnormal    Collection Time: 07/07/22 12:37 PM  Result Value Ref Range    Retic Ct Pct 8.2 (H) 0.4 - 3.1 %    RBC. 2.38 (L) 4.22 - 5.81 MIL/uL    Retic Count, Absolute 195.4 (H) 19.0 - 186.0 K/uL    Immature Retic Fract 28.7 (H) 2.3 - 15.9 %      Comment: Performed at [facility], [address], [place], [place] [address]       Imaging Results (Last 48 hours)  VAS [place] LOWER EXTREMITY VENOUS (DVT)   Result Date: 07/06/2022  Lower Venous DVT Study Patient Name:  NADAV SWINDELL  Date of Exam:   07/06/2022 Medical Rec #: [id number]       Accession #:    [id number] Date of Birth: 1955/09/20       Patient Gender: M Patient Age:   67 years Exam Location:  [facility] Procedure:      VAS  [place] LOWER EXTREMITY VENOUS (DVT) Referring Phys: Bess Harvest [facility] --------------------------------------------------------------------------------  Indications: D-dimer 2.14, suspicion for PE.  Comparison Study: CTA performed today negative for PE Performing Technologist: Jean Rosenthal RDMS, RVT  Examination Guidelines: A complete evaluation includes B-mode imaging, spectral Doppler, color Doppler, and power Doppler as needed of all accessible portions of each vessel. Bilateral testing is considered an integral part of a complete examination. Limited examinations for reoccurring indications may be performed as noted. The reflux portion of the exam is  performed with the patient in reverse Trendelenburg.  +---------+---------------+---------+-----------+----------+--------------+ RIGHT    CompressibilityPhasicitySpontaneityPropertiesThrombus Aging +---------+---------------+---------+-----------+----------+--------------+ CFV      Full           Yes      Yes                                 +---------+---------------+---------+-----------+----------+--------------+ SFJ      Full                                                        +---------+---------------+---------+-----------+----------+--------------+ FV Prox  Full                                                        +---------+---------------+---------+-----------+----------+--------------+ FV Mid   Full                                                        +---------+---------------+---------+-----------+----------+--------------+ FV DistalFull                                                        +---------+---------------+---------+-----------+----------+--------------+ PFV      Full                                                        +---------+---------------+---------+-----------+----------+--------------+ POP      Full           Yes      Yes                                 +---------+---------------+---------+-----------+----------+--------------+ PTV      Full                                                        +---------+---------------+---------+-----------+----------+--------------+ PERO     Full                                                        +---------+---------------+---------+-----------+----------+--------------+   +---------+---------------+---------+-----------+----------+--------------+  LEFT     CompressibilityPhasicitySpontaneityPropertiesThrombus Aging +---------+---------------+---------+-----------+----------+--------------+ CFV      Full           Yes      Yes                                  +---------+---------------+---------+-----------+----------+--------------+ SFJ      Full                                                        +---------+---------------+---------+-----------+----------+--------------+ FV Prox  Full                                                        +---------+---------------+---------+-----------+----------+--------------+ FV Mid   Full                                                        +---------+---------------+---------+-----------+----------+--------------+ FV DistalFull                                                        +---------+---------------+---------+-----------+----------+--------------+ PFV      Full                                                        +---------+---------------+---------+-----------+----------+--------------+ POP      Full           Yes      Yes                                 +---------+---------------+---------+-----------+----------+--------------+ PTV      Full                                                        +---------+---------------+---------+-----------+----------+--------------+ PERO     Full                                                        +---------+---------------+---------+-----------+----------+--------------+     Summary: RIGHT: - There is no evidence of deep vein thrombosis in the lower extremity.  - No cystic structure found in the popliteal fossa.  LEFT: - There is no evidence of deep vein thrombosis in the lower extremity.  - No   cystic structure found in the popliteal fossa.  *See table(s) above for measurements and observations. Electronically signed by Sherald Hess MD on 07/06/2022 at 1:44:50 PM.    Final     CT Angio Chest Pulmonary Embolism (PE) W or WO Contrast   Result Date: 07/06/2022 CLINICAL DATA:  Pulmonary embolism (PE) suspected, high prob EXAM: CT ANGIOGRAPHY CHEST WITH CONTRAST TECHNIQUE: Multidetector CT imaging of the  chest was performed using the standard protocol during bolus administration of intravenous contrast. Multiplanar CT image reconstructions and MIPs were obtained to evaluate the vascular anatomy. RADIATION DOSE REDUCTION: This exam was performed according to the departmental dose-optimization program which includes automated exposure control, adjustment of the mA and/or kV according to patient size and/or use of iterative reconstruction technique. CONTRAST:  75mL OMNIPAQUE IOHEXOL 350 MG/ML SOLN COMPARISON:  CT chest Jun 21, 2022 FINDINGS: Cardiovascular: Satisfactory opacification of the pulmonary arteries to the segmental level. No evidence of pulmonary embolism. The thoracic aorta is normal in caliber. Normal heart size. No pericardial effusion. Mediastinum/Nodes: Enteric tube courses below the GE junction with the distal tip not visualized. No enlarged mediastinal, hilar, or axillary lymph nodes. The thyroid gland appears normal. Lungs/Pleura: No pleural effusion. No pneumothorax. Subsegmental atelectasis of the left lower lobe dependent lung. No suspicious pulmonary nodules. Musculoskeletal: No aggressive osseous lesions. Status post median sternotomy. Upper abdomen: The visualized upper abdomen is unremarkable. Review of the MIP images confirms the above findings. IMPRESSION: 1. No findings of pulmonary embolism. 2. There is subsegmental atelectasis of the dependent left lower lobe. Otherwise, no acute findings in the chest. Electronically Signed   By: Olive Bass M.D.   On: 07/06/2022 09:26    DG Chest Port 1 View   Result Date: 07/05/2022 CLINICAL DATA:  Acute respiratory distress. EXAM: PORTABLE CHEST 1 VIEW COMPARISON:  07/04/2022. FINDINGS: The heart size and mediastinal contours are stable. There is atherosclerotic calcification of the aorta. Lung volumes are low with mild atelectasis at the lung bases. There is a small left pleural effusion. No pneumothorax. An enteric tube courses over the stomach  and out of the field of view. Sternotomy wires are present over the midline. IMPRESSION: 1. Low lung volumes with atelectasis at the lung bases. 2. Small left pleural effusion. Electronically Signed   By: Thornell Sartorius M.D.   On: 07/05/2022 22:13           Blood pressure 94/61, pulse (!) 102, temperature 97.7 F (36.5 C), temperature source Oral, resp. rate 20, height 5\' 5"  (1.651 m), weight 53.8 kg, SpO2 99 %.   Medical Problem List and Plan: 1. Functional deficits secondary to myasthenia gravis flare             -patient may shower             -ELOS/Goals: 18-21 days, Min A PT/OT/SLP  -Appreciate hospitalist discharge notation: Medications that may worsen or trigger MG exacerbation: Class IA antiarrhythmics, magnesium, flouroquinolones, macrolides, aminoglycosides, penicillamine, curare, interferon alpha, botox, quinine. Use with caution: calcium channel blockers, beta blockers and statins.   2.  Antithrombotics: -DVT/anticoagulation:  Pharmaceutical: Lovenox             -antiplatelet therapy: N/A  3. Pain Management: Tylenol prn.   4. Mood/Behavior/Sleep: LCSW to follow for evaluation and support. Team to provide ego support.              -antipsychotic agents: N/A             --Melatonin prn  for insomnia.   5. Neuropsych/cognition: This patient is capable of making decisions on his own behalf. 6. Skin/Wound Care: Routine pressure relief measures 7. Fluids/Electrolytes/Nutrition: Monitor I/O. Change tube feed to 60  cc/hr X 10 hours w/H2O qid.  --start Calorie count tomorrow. Wife encouraged to bring soft foods from home.               --downgrade diet to D2--reports feeling food sticking in throat/throat discomfort from intubations             --MMW added.   8. MG flare w/ respiratory failure: Has completed course of IVIG.  --Continue to monitor NIF/VC--> NIF -40 VC-1.2 L this am.              --Continue home regimen of Mestinon and Prednisone.   9. Anxiety: Continue Klonopin,  Paxil and prn Xanax. Continue to provide ego support.  --Patient frustrated/expressing disappointment that he has not rebounded as in the past. Continue to encourage/provide support.  -- Would benefit from neuropsych evaluation  10. T2DM: Hgb A1c--5.7 and well controlled. Will monitor BS ac/hs and use SSI for elevated BS             --continue to hold Metformin and Januvia (discussed need to increase intake w/patient)  11. Hep B: On Descovy as substitute-->wife to bring Entecavir from home.   12. Hypotension/Tachycardia: Ongoing for weeks. Inpatient workup including CTA chest and lower extremity venous duplex negative  - Monitor for any signs of decline in his NIF or vital capacity, if any worsening discontinue Cardizem.    Jacquelynn Cree, PA-C 07/07/2022  I have examined the patient independently and edited the note for HPI, ROS, exam, assessment, and plan as appropriate. I am in agreement with the above recommendations.   Angelina Sheriff, DO 07/07/2022

## 2022-07-07 NOTE — Progress Notes (Signed)
Inpatient Rehab Admissions Coordinator:    I have a CIR bed for this pt. Today. MD to place d/c order.   Pt. And wife in agreement to d/c to CIR to participate in an intensive rehab program for an estimated 7-10 days, with the goal of reaching supervision level and returning home with his wife for 24/7 supervision and support.   Megan Salon, MS, CCC-SLP Rehab Admissions Coordinator  [phone] (celll) [phone] (office)

## 2022-07-07 NOTE — Progress Notes (Signed)
INPATIENT REHABILITATION ADMISSION NOTE   Arrival Method: Bed     Mental Orientation: Alert and Oriented X4   Assessment: done   Skin: assessed   IV'S: peft arm X2   Pain: none   Tubes and Drains: Cortrak in place   Safety Measures: reviewed with pt and family   Vital Signs: done   Height and Weight: done   Rehab Orientation: done   Family: at bedside    Notes:  Marylu Lund, RN

## 2022-07-07 NOTE — TOC Progression Note (Signed)
Transition of Care [facility]) - Progression Note    Patient Details  Name: Everest Brod MRN: [id number] Date of Birth: 1955-03-10  [facility]) CM/SW Contact  Mearl Latin, LCSW Phone Number: 07/07/2022, 9:33 AM  Clinical Narrative:    TOC continuing to follow for needs.   Expected Discharge Plan: IP Rehab Facility Barriers to Discharge: Continued Medical Work up  Expected Discharge Plan and Services     Post Acute Care Choice: IP Rehab Living arrangements for the past 2 months: Single Family Home                                       Social Determinants of Health (SDOH) Interventions SDOH Screenings   Food Insecurity: Patient Unable To Answer (06/22/2022)  Recent Concern: Food Insecurity - Food Insecurity Present (06/16/2022)  Housing: Patient Unable To Answer (06/22/2022)  Transportation Needs: Patient Unable To Answer (06/22/2022)  Utilities: Patient Unable To Answer (06/22/2022)  Depression (PHQ2-9): High Risk (06/21/2022)  Tobacco Use: Low Risk  (07/06/2022)    Readmission Risk Interventions     No data to display

## 2022-07-07 NOTE — Progress Notes (Signed)
NIF/VC not done, pt sleeping.

## 2022-07-07 NOTE — Progress Notes (Signed)
NIF and VC performed with good effort. NIF -40. VC 1.2L.

## 2022-07-07 NOTE — Progress Notes (Addendum)
PROGRESS NOTE                                                                                                                                                                                                             Patient Demographics:    [name], is a 67 y.o. male, DOB - [birth year], ZOX:[id number]  Outpatient Primary MD for the patient is [place], [name], MD    LOS - 16  Admit date - 06/21/2022    Chief Complaint  Patient presents with   Shortness of Breath       Brief Narrative (HPI from H&P)    67 year old man w/ hx of MG, malignant thymoma s/p resection, DM type II, hypertension who was recently admitted to the hospital for myasthenia crisis and discharged home on 06/19/2022, presented again to the hospital few days later on 06/21/2022 for shortness of breath again due to myasthenia crisis this time needed to be intubated and admitted to ICU.    He was seen by neurology, and ICU team, has finished his course of IVIG treatment, was intubated extubated twice this admission, still quite weak, feeding via core track, weak cough reflex, transferred to hospitalist service on 07/03/2022 and under my care on 07/04/2022 on day 13 of hospital stay.  Significant Hospital Events:   5/22 admit 5/23 given Mg for replenishment, which exacerbated his MG symptoms. Extubation postponed. 5/24 pt extubate, reintubated. NIFs were -10 to -15 before reintubation. 5/27 extubated and reintubated 9h later due to ineffective cough.  5/28 MRI negative for stroke.  5/30 extubated. 6/1 back on IV diltiazem for hypertension and tachycardia.  Gradually improving strength.   Subjective:   Patient in bed, appears comfortable, denies any headache, no fever, no chest pain or pressure, no shortness of breath , no abdominal pain. No new focal weakness.    Assessment  & Plan :    Medications that may worsen or trigger MG exacerbation: Class IA  antiarrhythmics, magnesium, flouroquinolones, macrolides, aminoglycosides, penicillamine, curare, interferon alpha, botox, quinine. Use with caution: calcium channel blockers, beta blockers and statins.    Acute hypoxemic and hypercarbic respiratory failure due to recurrent MG crisis with unclear trigger.  Now required 2nd reintubation.  Patient has refused tracheostomy and PICC placement. Anxiety/PTSD component as well.  History of thymoma & past history of resection.  -  Seen by neurology and PCCM, intubated extubated x 2 this admission, finished IVIG treatment  x 5, now on prednisone and Mestinon, still quite weak, monitor NIF and vital capacity, advance activity, PT OT speech.  Neurology has signed off, PCCM signed off, may require SNF/CIR.  Still quite weak overall but definite gradual improvement, if oral intake has improved will try and remove core track on 07/05/2022.  07/07/22  NIF -40. VC 1.2L.   Normocytic anemia.  Check anemia panel and monitor.  Type screen.    Weakness and dysphagia.  Due to #1 above.  Feeding via core track tube, speech following, advance activity, encouraged to sit in chair in the daytime, monitor oral diet and calorie intake.    Extreme anxiety.  Placed on Klonopin, Paxil and Xanax.    Sinus tachycardia/hypertension due to exertion/anxiety  - likely should have autonomic dysfunction from myasthenia gravis along with extreme anxiety, TSH is stable, he is otherwise symptom-free, CTA chest and lower extremity venous duplex negative for any clots.  Recent echocardiogram with preserved EF and no acute findings, will check with neurology if low-dose Cardizem can be safely tried with his underlying myasthenia gravis since we have no other options for his tachycardia which is persistent and present for several weeks now.  Note he was on Cardizem for several days in ICU as well prior to moving to the floor.   DM2 without hyperglycemia  - Sliding Scale insulin   CBG (last 3)   Recent Labs    07/07/22 0042 07/07/22 0430 07/07/22 0749  GLUCAP 316* 270* 207*         Condition - Extremely Guarded  Family Communication  :  wife bedside 07/04/22, 07/05/2022, 07/06/2022, 07/07/2022  Code Status :  Full  Consults  :  Neuro, PCCM  PUD Prophylaxis : PPI   Procedures  :     CTA.  No PE, some atelectasis  Lower extremity venous duplex.  No DVT in bilateral lower extremities      Disposition Plan  :    Status is: Inpatient   DVT Prophylaxis  :    heparin injection 5,000 Units Start: 06/22/22 0600 SCDs Start: 06/21/22 1841    Lab Results  Component Value Date   PLT 305 07/07/2022    Diet :  Diet Order             DIET DYS 3 Room service appropriate? No; Fluid consistency: Thin  Diet effective now                    Inpatient Medications  Scheduled Meds:  cholecalciferol  2,000 Units Oral Daily   clonazePAM  1 mg Oral BID   emtricitabine-tenofovir AF  1 tablet Oral Daily   feeding supplement  237 mL Oral TID BM   feeding supplement (OSMOLITE 1.5 CAL)  1,000 mL Per Tube Q24H   free water  100 mL Per Tube Q6H   heparin  5,000 Units Subcutaneous Q8H   insulin aspart  0-20 Units Subcutaneous Q4H   mouth rinse  15 mL Mouth Rinse 4 times per day   pantoprazole  40 mg Oral BID AC   PARoxetine  20 mg Oral Daily   predniSONE  10 mg Per Tube Q breakfast   pyridostigmine  60 mg Per Tube Q8H   Continuous Infusions:   PRN Meds:.acetaminophen, ALPRAZolam, haloperidol, hydrALAZINE, mouth rinse     Objective:   Vitals:   07/06/22 1951 07/07/22 0010 07/07/22 0431 07/07/22  0751  BP: (!) 100/58 99/71 (!) 144/97   Pulse: (!) 105 (!) 103 (!) 117   Resp: (!) 25 (!) 22 (!) 25   Temp: 98.2 F (36.8 C) 98 F (36.7 C) 98.3 F (36.8 C)   TempSrc: Oral Oral Oral Oral  SpO2: 100% 99%    Weight:   53.8 kg   Height:        Wt Readings from Last 3 Encounters:  07/07/22 53.8 kg  06/21/22 51.4 kg  06/16/22 53 kg     Intake/Output Summary  (Last 24 hours) at 07/07/2022 0912 Last data filed at 07/07/2022 0800 Gross per 24 hour  Intake 1336 ml  Output 998 ml  Net 338 ml     Physical Exam  Awake Alert, No new F.N deficits, diffuse generalized weakness, improved cough reflex, NG tube, anxious ++ Dewy Rose.AT,PERRAL Supple Neck, No JVD,   Symmetrical Chest wall movement, Good air movement bilaterally, CTAB RRR,No Gallops,Rubs or new Murmurs,  +ve B.Sounds, Abd Soft, No tenderness,   No Cyanosis, Clubbing or edema      Data Review:    Recent Labs  Lab 07/01/22 0201 07/04/22 0616 07/05/22 0607 07/06/22 0046 07/07/22 0540  WBC 13.2* 11.8* 8.5 8.4 7.0  HGB 10.5* 9.5* 8.0* 7.9* 7.3*  HCT 31.3* 29.3* 24.5* 24.6* 22.1*  PLT 380 396 337 314 305  MCV 85.3 88.5 88.1 88.2 88.4  MCH 28.6 28.7 28.8 28.3 29.2  MCHC 33.5 32.4 32.7 32.1 33.0  RDW 14.8 15.3 15.4 15.5 16.2*  LYMPHSABS 3.5  --  2.9 2.5 2.0  MONOABS 1.1*  --  0.5 0.5 0.5  EOSABS 0.0  --  0.2 0.2 0.2  BASOSABS 0.1  --  0.0 0.0 0.0    Recent Labs  Lab 07/01/22 0201 07/04/22 0616 07/05/22 0607 07/06/22 0046 07/07/22 0540  NA 136 143 139 136 134*  K 3.8 3.4* 3.6 3.6 4.0  CL 102 103 104 104 103  CO2 25 30 26 24 23   ANIONGAP 9 10 9 8 8   GLUCOSE 248* 214* 205* 151* 272*  BUN 31* 41* 41* 30* 20  CREATININE 0.84 1.00 0.96 0.85 0.81  AST 38  --   --   --   --   ALT 35  --   --   --   --   ALKPHOS 73  --   --   --   --   BILITOT 0.4  --   --   --   --   ALBUMIN 2.6*  --   --   --   --   CRP  --  0.6 0.7 0.6 1.2*  DDIMER  --   --   --  2.14*  --   PROCALCITON  --  0.58 0.50 0.38 0.27  TSH  --  0.411  --   --   --   BNP  --  33.2 34.6 55.8 87.5  MG  --  2.5* 2.2 2.1 2.2  CALCIUM 9.1 9.7 8.7* 8.2* 8.0*      Recent Labs  Lab 07/01/22 0201 07/04/22 0616 07/05/22 0607 07/06/22 0046 07/07/22 0540  CRP  --  0.6 0.7 0.6 1.2*  DDIMER  --   --   --  2.14*  --   PROCALCITON  --  0.58 0.50 0.38 0.27  TSH  --  0.411  --   --   --   BNP  --  33.2 34.6 55.8 87.5   MG  --  2.5* 2.2 2.1 2.2  CALCIUM 9.1 9.7 8.7* 8.2* 8.0*     No results for input(s): "TSH", "T4TOTAL", "T3FREE", "THYROIDAB" in the last 72 hours.  Invalid input(s): "FREET3"  Radiology Reports VAS [place] LOWER EXTREMITY VENOUS (DVT)  Result Date: 07/06/2022  Lower Venous DVT Study Patient Name:  [name]  Date of Exam:   07/06/2022 Medical Rec #: [id number]       Accession #:    [id number] Date of Birth: [birth year]       Patient Gender: M Patient Age:   12 years Exam Location:  [facility] Procedure:      VAS [place] LOWER EXTREMITY VENOUS (DVT) Referring Phys: Bess Harvest [facility] --------------------------------------------------------------------------------  Indications: D-dimer 2.14, suspicion for PE.  Comparison Study: CTA performed today negative for PE Performing Technologist: Jean Rosenthal RDMS, RVT  Examination Guidelines: A complete evaluation includes B-mode imaging, spectral Doppler, color Doppler, and power Doppler as needed of all accessible portions of each vessel. Bilateral testing is considered an integral part of a complete examination. Limited examinations for reoccurring indications may be performed as noted. The reflux portion of the exam is performed with the patient in reverse Trendelenburg.  +---------+---------------+---------+-----------+----------+--------------+ RIGHT    CompressibilityPhasicitySpontaneityPropertiesThrombus Aging +---------+---------------+---------+-----------+----------+--------------+ CFV      Full           Yes      Yes                                 +---------+---------------+---------+-----------+----------+--------------+ SFJ      Full                                                        +---------+---------------+---------+-----------+----------+--------------+ FV Prox  Full                                                        +---------+---------------+---------+-----------+----------+--------------+ FV Mid   Full                                                         +---------+---------------+---------+-----------+----------+--------------+ FV DistalFull                                                        +---------+---------------+---------+-----------+----------+--------------+ PFV      Full                                                        +---------+---------------+---------+-----------+----------+--------------+ POP      Full           Yes      Yes                                 +---------+---------------+---------+-----------+----------+--------------+  PTV      Full                                                        +---------+---------------+---------+-----------+----------+--------------+ PERO     Full                                                        +---------+---------------+---------+-----------+----------+--------------+   +---------+---------------+---------+-----------+----------+--------------+ LEFT     CompressibilityPhasicitySpontaneityPropertiesThrombus Aging +---------+---------------+---------+-----------+----------+--------------+ CFV      Full           Yes      Yes                                 +---------+---------------+---------+-----------+----------+--------------+ SFJ      Full                                                        +---------+---------------+---------+-----------+----------+--------------+ FV Prox  Full                                                        +---------+---------------+---------+-----------+----------+--------------+ FV Mid   Full                                                        +---------+---------------+---------+-----------+----------+--------------+ FV DistalFull                                                        +---------+---------------+---------+-----------+----------+--------------+ PFV      Full                                                         +---------+---------------+---------+-----------+----------+--------------+ POP      Full           Yes      Yes                                 +---------+---------------+---------+-----------+----------+--------------+ PTV      Full                                                        +---------+---------------+---------+-----------+----------+--------------+  PERO     Full                                                        +---------+---------------+---------+-----------+----------+--------------+     Summary: RIGHT: - There is no evidence of deep vein thrombosis in the lower extremity.  - No cystic structure found in the popliteal fossa.  LEFT: - There is no evidence of deep vein thrombosis in the lower extremity.  - No cystic structure found in the popliteal fossa.  *See table(s) above for measurements and observations. Electronically signed by Sherald Hess MD on 07/06/2022 at 1:44:50 PM.    Final    CT Angio Chest Pulmonary Embolism (PE) W or WO Contrast  Result Date: 07/06/2022 CLINICAL DATA:  Pulmonary embolism (PE) suspected, high prob EXAM: CT ANGIOGRAPHY CHEST WITH CONTRAST TECHNIQUE: Multidetector CT imaging of the chest was performed using the standard protocol during bolus administration of intravenous contrast. Multiplanar CT image reconstructions and MIPs were obtained to evaluate the vascular anatomy. RADIATION DOSE REDUCTION: This exam was performed according to the departmental dose-optimization program which includes automated exposure control, adjustment of the mA and/or kV according to patient size and/or use of iterative reconstruction technique. CONTRAST:  75mL OMNIPAQUE IOHEXOL 350 MG/ML SOLN COMPARISON:  CT chest Jun 21, 2022 FINDINGS: Cardiovascular: Satisfactory opacification of the pulmonary arteries to the segmental level. No evidence of pulmonary embolism. The thoracic aorta is normal in caliber. Normal heart size. No pericardial effusion.  Mediastinum/Nodes: Enteric tube courses below the GE junction with the distal tip not visualized. No enlarged mediastinal, hilar, or axillary lymph nodes. The thyroid gland appears normal. Lungs/Pleura: No pleural effusion. No pneumothorax. Subsegmental atelectasis of the left lower lobe dependent lung. No suspicious pulmonary nodules. Musculoskeletal: No aggressive osseous lesions. Status post median sternotomy. Upper abdomen: The visualized upper abdomen is unremarkable. Review of the MIP images confirms the above findings. IMPRESSION: 1. No findings of pulmonary embolism. 2. There is subsegmental atelectasis of the dependent left lower lobe. Otherwise, no acute findings in the chest. Electronically Signed   By: Olive Bass M.D.   On: 07/06/2022 09:26   DG Chest Port 1 View  Result Date: 07/05/2022 CLINICAL DATA:  Acute respiratory distress. EXAM: PORTABLE CHEST 1 VIEW COMPARISON:  07/04/2022. FINDINGS: The heart size and mediastinal contours are stable. There is atherosclerotic calcification of the aorta. Lung volumes are low with mild atelectasis at the lung bases. There is a small left pleural effusion. No pneumothorax. An enteric tube courses over the stomach and out of the field of view. Sternotomy wires are present over the midline. IMPRESSION: 1. Low lung volumes with atelectasis at the lung bases. 2. Small left pleural effusion. Electronically Signed   By: Thornell Sartorius M.D.   On: 07/05/2022 22:13   DG Swallowing Func-Speech Pathology  Result Date: 07/04/2022 Table formatting from the original result was not included. Modified Barium Swallow Study Patient Details Name: Sonam Yazzie MRN: [id number] Date of Birth: [birth year] Today's Date: 07/04/2022 HPI/PMH: HPI: 66/M, hx seropositive myasthenia gravis recently admitted for exacerbation. He improved on IVIG and was discharged to home on 5/20, at which point his respiratory function was vastly improved although he did have some residual ptosis. He then  re-presented on 5/22, 3 days after discharge, with respiratory distress and NIF -19. He was reintubated  in the ED due to concerns for increasing hypercarbia on his blood gases. He was started on a repeat course of IVIG after weighing IVIG versus PLEX.  5/22 admit, intubated, 5/23 given Mg for replenishment, which exacerbated his MG symptoms. Extubation postponed. 5/24 pt extubate, reintubated. NIFs were -10 to -15 before reintubation. 5/27 extubated and reintubated 9h later due to ineffective cough. 5/28 MRI negative for stroke. Extubated 5/30 Clinical Impression: Clinical Impression: Pt presents with a mild to moderate oropharyngeal dysphagia with sluggish and repetitive lingual function resulting in prolonged oral phase and mild lingual and floor of mouth residue post swallow. Timing of swallowing initiation relatively good; though there are instances of thin liquids arriving just barely to pyriforms prior to hyoid burst. The primary pharyngeal impairment is mild weakness resulting in decreased epiglottic deflection; pt appears to not be able to overcome presence of NG tube. This leads to moderate vallecular and high lateral channel residue, more with solids and nectar thick liquids than thin liquids. Pt is sensate to one very trace instance of frank penetration. Protected airway throughout. He does appear to have ongoing generalized weakness and waxing and waning function. Pt can initiate thin liquids and mechanical soft solids, following bites with sips. Can attempt meds orally with thin liquids. Would not remove NG tube yet until he's consistently accepting PO. Ok for family to bring food from home. Would benefit from up to chair for meals or chair position in bed. Factors that may increase risk of adverse event in presence of aspiration Rubye Oaks & Clearance Coots 2021): Factors that may increase risk of adverse event in presence of aspiration Rubye Oaks & Clearance Coots 2021): Weak cough; Inadequate oral hygiene; Frail or  deconditioned; Limited mobility; Reduced cognitive function; Poor general health and/or compromised immunity Recommendations/Plan: Swallowing Evaluation Recommendations Swallowing Evaluation Recommendations Liquid Administration via: Cup; Straw Medication Administration: Whole meds with liquid Supervision: Staff to assist with self-feeding; Full assist for feeding Swallowing strategies  : Slow rate; Small bites/sips; Follow solids with liquids Postural changes: Position pt fully upright for meals; Stay upright 30-60 min after meals Treatment Plan Treatment Plan Treatment recommendations: Therapy as outlined in treatment plan below Follow-up recommendations: Acute inpatient rehab (3 hours/day) Functional status assessment: Patient has had a recent decline in their functional status and demonstrates the ability to make significant improvements in function in a reasonable and predictable amount of time. Treatment frequency: Min 2x/week Treatment duration: 2 weeks Interventions: Aspiration precaution training; Compensatory techniques; Respiratory muscle strength training; Diet toleration management by SLP; Patient/family education Recommendations Recommendations for follow up therapy are one component of a multi-disciplinary discharge planning process, led by the attending physician.  Recommendations may be updated based on patient status, additional functional criteria and insurance authorization. Assessment: Orofacial Exam: Orofacial Exam Oral Cavity: Oral Hygiene: Xerostomia Oral Cavity - Dentition: Adequate natural dentition Orofacial Anatomy: WFL Anatomy: No data recorded Boluses Administered: Boluses Administered Boluses Administered: Thin liquids (Level 0); Mildly thick liquids (Level 2, nectar thick); Puree; Solid  Oral Impairment Domain: Oral Impairment Domain Lip Closure: No labial escape Tongue control during bolus hold: Escape to lateral buccal cavity/floor of mouth Bolus preparation/mastication: Slow  prolonged chewing/mashing with complete recollection Bolus transport/lingual motion: Slow tongue motion Oral residue: Residue collection on oral structures Location of oral residue : Floor of mouth; Tongue Initiation of pharyngeal swallow : Pyriform sinuses  Pharyngeal Impairment Domain: Pharyngeal Impairment Domain Soft palate elevation: No bolus between soft palate (SP)/pharyngeal wall (PW) Laryngeal elevation: Complete superior movement of thyroid cartilage with complete  approximation of arytenoids to epiglottic petiole Anterior hyoid excursion: Complete anterior movement Epiglottic movement: Partial inversion Laryngeal vestibule closure: Complete, no air/contrast in laryngeal vestibule Pharyngeal stripping wave : Present - complete Pharyngeal contraction (A/P view only): N/A Pharyngoesophageal segment opening: Complete distension and complete duration, no obstruction of flow Tongue base retraction: Trace column of contrast or air between tongue base and PPW Pharyngeal residue: Collection of residue within or on pharyngeal structures Location of pharyngeal residue: Valleculae; Aryepiglottic folds  Esophageal Impairment Domain: No data recorded Pill: No data recorded Penetration/Aspiration Scale Score: Penetration/Aspiration Scale Score 1.  Material does not enter airway: Thin liquids (Level 0); Mildly thick liquids (Level 2, nectar thick); Puree; Solid 4.  Material enters airway, CONTACTS cords then ejected out: Thin liquids (Level 0) Compensatory Strategies: Compensatory Strategies Compensatory strategies: No   General Information: Caregiver present: No  Diet Prior to this Study: NPO; Cortrak/Small bore NG tube   Temperature : Normal   Respiratory Status: WFL   Supplemental O2: None (Room air)   History of Recent Intubation: Yes  Behavior/Cognition: Lethargic/Drowsy; Requires cueing; Cooperative Self-Feeding Abilities: Needs hand-over-hand assist for feeding Baseline vocal quality/speech: Hypophonia/low volume  Volitional Cough: Able to elicit Volitional Swallow: Able to elicit Exam Limitations: Fatigue Goal Planning: Prognosis for improved oropharyngeal function: Good No data recorded No data recorded No data recorded Consulted and agree with results and recommendations: Pt unable/family or caregiver not available Pain: Pain Assessment Pain Assessment: Faces Faces Pain Scale: 2 Breathing: 1 Negative Vocalization: 0 Facial Expression: 1 Body Language: 0 Consolability: 1 PAINAD Score: 3 Facial Expression: 0 Body Movements: 2 Muscle Tension: 0 Compliance with ventilator (intubated pts.): N/A Vocalization (extubated pts.): 0 CPOT Total: 2 Pain Intervention(s): Monitored during session End of Session: Start Time:SLP Start Time (ACUTE ONLY): 0930 Stop Time: SLP Stop Time (ACUTE ONLY): 0950 Time Calculation:SLP Time Calculation (min) (ACUTE ONLY): 20 min Charges: SLP Evaluations $ SLP Speech Visit: 1 Visit SLP Evaluations $MBS Swallow: 1 Procedure $FEES Swallow: 1 Procedure $Swallowing Treatment: 1 Procedure SLP visit diagnosis: SLP Visit Diagnosis: Dysphagia, oropharyngeal phase (R13.12) Past Medical History: Past Medical History: Diagnosis Date  Anxiety   Colon polyp   Depressive disorder, not elsewhere classified   External hemorrhoid   Insomnia, unspecified   Internal hemorrhoids   Myasthenia gravis without exacerbation (HCC)   Other and unspecified hyperlipidemia   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)   Type II or unspecified type diabetes mellitus without mention of complication, not stated as uncontrolled   Viral hepatitis B without mention of hepatic coma, chronic, without mention of hepatitis delta  Past Surgical History: Past Surgical History: Procedure Laterality Date  THYMECTOMY   Harlon Ditty, MA CCC-SLP Acute [facility] Services Secure Chat Preferred Office [phone] Claudine Mouton 07/04/2022, 10:20 AM  DG Chest Port 1 View  Result Date: 07/04/2022 CLINICAL DATA:  Shortness of  breath EXAM: PORTABLE CHEST 1 VIEW COMPARISON:  06/26/2022 FINDINGS: A feeding tube at least reaches the stomach. Artifact from EKG leads. Normal heart size and mediastinal contours. No acute infiltrate or edema. No effusion or pneumothorax. No acute osseous findings. IMPRESSION: No evidence of active disease. Electronically Signed   By: Tiburcio Pea M.D.   On: 07/04/2022 07:28      Signature  -   Susa Raring M.D on 07/07/2022 at 9:12 AM   -  To page go to [email or web address]

## 2022-07-07 NOTE — Progress Notes (Signed)
PMR Admission Coordinator Pre-Admission Assessment  Patient: [name] is an 67 y.o., male MRN: [id number] DOB: 06/07/1955 Height: 5' 5" (165.1 cm) Weight: 48.2 kg  Insurance Information HMO:     PPO:      PCP:      IPA:      80/20: yes     OTHER:  PRIMARY: Medicare A and B       Policy#: [id number] Subscriber: Pt. Phone#: Verified online    Fax#:  Pre-Cert#:       Employer:  Benefits:  Phone #:      Name:  Eff. Date: Parts A effective  03/30/2004 and B effective 01/30/2005  Deduct: $1632      Out of Pocket Max:  None      Life Max: N/A  CIR: 100%      SNF: 100 days Outpatient: 80%     Co-Pay: 20% Home Health58: 100%      Co-Pay: none DME: 80%     Co-Pay: 20% Providers: patient's choice SECONDARY:       Policy#:      Phone#:   Financial Counselor:       Phone#:   The "Data Collection Information Summary" for patients in Inpatient Rehabilitation Facilities with attached "Privacy Act Statement-Health Care Records" was provided and verbally reviewed with: Patient and Family  Emergency Contact Information Contact Information     Name Relation Home Work Mobile   [name] Spouse [phone]  [phone]   [name] Son   [phone]       Current Medical History  Patient Admitting Diagnosis: Myesthenia Gravis crisis, Rsiratory failure.  History of Present Illness: [name], a 67 y/o with history of myasthenia gravis, hepatitis B, T2DM, hypertension presented to [facility] ED 06/21/22 with with extreme tachypnea  Intubated on arrival.  Pt. With recent admit 5/17-5/22 for MG flare.  Pt. Had onset of  MG 2003 s/p thymectomy for malignant thymoma. Treated with progressive doses of prednisone through  2016. He had a plasma exchange in 2017 with good results. In 2018 with recurrent symptoms prednisone was gradually increased to 30 mg daily by 2019. In 2019 he had infusion of IVIG which helped ameliorate his symptoms of swallow difficulty and SOB. He saw his  neurologist at [facility] for same on 06/08/22 and they suspected that the patients PTSD and anxiety was a component of his recurrent symptoms.  Pt. Was extubated and reintubated 06/23/22. Declined trach and PICC placement. Pt. With altered mental status but MRI 06/27/22 negative for stroke. Pt extubated to room air 5/30. Pt. Very weak following extubation, with coretrak for nutrition. Pt. Seen by PT/OT/SLP and they recommend CIR to assist return to PLOF.   Patient's medical record from [facility]  has been reviewed by the rehabilitation admission coordinator and physician.  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 No    Family History   family history is not on file.  Current Medications  Current Facility-Administered Medications:    0.9 %  sodium chloride infusion, 250 mL, Intravenous, Continuous, Sood, Vineet, MD, Stopped at 06/30/22 0905   acetaminophen (TYLENOL) 160 MG/5ML solution 650 mg, 650 mg, Per Tube, Q6H PRN, Meier, Nathaniel M, MD, 650 mg at 07/03/22 2103   cholecalciferol (VITAMIN D3) 25 MCG (1000 UNIT) tablet 2,000 Units, 2,000 Units, Oral, Daily, Singh, Prashant K, MD, 2,000 Units at 07/04/22 1203   clonazePAM (KLONOPIN) disintegrating tablet 0.5 mg, 0.5 mg, Per Tube, BID, Agarwala, Ravi, MD, 0.5 mg at 07/04/22 1015   emtricitabine-tenofovir AF (DESCOVY) 200-25 MG per tablet 1 tablet, 1 tablet, Oral, Daily,  Pham, Minh Sandoval, RPH-CPP, 1 tablet at 07/04/22 1203   feeding supplement (VITAL AF 1.2 CAL) liquid 1,000 mL, 1,000 mL, Per Tube, Continuous, Agarwala, Ravi, MD, Last Rate: 55 mL/hr at 07/04/22 0507, 1,000 mL at 07/04/22 0507   FLUoxetine (PROZAC) capsule 20 mg, 20 mg, Per Tube, Daily, Agarwala, Ravi, MD, 20 mg at 07/04/22 1015   haloperidol (HALDOL) tablet 1 mg, 1 mg, Per Tube, Q6H PRN, Agarwala, Ravi, MD   heparin injection 5,000 Units, 5,000 Units, Subcutaneous, Q8H, Reese, Stephanie M, PA-C, 5,000 Units at 07/04/22 0507   hydrALAZINE (APRESOLINE) injection 10 mg, 10 mg, Intravenous, Q6H PRN, Harris, Whitney D, NP, 10 mg at 06/30/22 1146   insulin aspart (novoLOG) injection 0-20 Units, 0-20 Units, Subcutaneous, Q4H, Meier, Nathaniel M, MD, 7 Units at 07/04/22 1202   LORazepam (ATIVAN) injection 1 mg, 1 mg, Intravenous, Q4H PRN, Agarwala, Ravi, MD, 1 mg at 07/04/22 0541   Oral care mouth rinse, 15 mL, Mouth Rinse, 4 times per day, Agarwala, Ravi, MD, 15 mL at 07/04/22 1203   Oral care mouth rinse, 15 mL, Mouth Rinse, PRN, Agarwala, Ravi, MD   pantoprazole (PROTONIX) EC tablet 40 mg, 40 mg, Oral, Daily, Singh, Prashant K, MD, 40 mg at 07/04/22 1203   predniSONE (DELTASONE) tablet 10 mg, 10 mg, Per Tube, Sandoval breakfast, Meier, Nathaniel M, MD, 10 mg at 07/04/22 1015   pyridostigmine (MESTINON) tablet 60 mg, 60 mg, Per Tube, Q8H, Agarwala, Ravi, MD, 60 mg at 07/04/22 1203  Patients Current Diet:  Diet Order             DIET DYS 3 Room service appropriate? No; Fluid consistency: Thin  Diet effective now                   Precautions / Restrictions Precautions Precautions: Fall Precaution Comments: watch RR and BP Restrictions Weight Bearing Restrictions: No   Has the patient had 2 or more falls or a fall with injury in the past year? No  Prior Activity Level Community (5-7x/wk): pt. active in the community PTA  Prior Functional Level Self Care: Did the patient need help bathing,  dressing, using the toilet or eating? Independent  Indoor Mobility: Did the patient need assistance with walking from room to room (with or without device)? Independent  Stairs: Did the patient need assistance with internal or external stairs (with or without device)? Independent  Functional Cognition: Did the patient need help planning regular tasks such as shopping or remembering to take medications? Independent  Patient Information Are you of Hispanic, Latino/a,or Spanish origin?: A. No, not of Hispanic, Latino/a, or Spanish origin (proxy) What is your race?: J. Other Asian Do you need or want an interpreter to communicate with a doctor or health care staff?: 0. No    Patient's Response To:  Health Literacy and Transportation Is the patient able to respond to health literacy and transportation needs?: No (proxy) Health Literacy - How often do you need to have someone help you when you read instructions, pamphlets, or other written material from your doctor or pharmacy?: Patient unable to respond In the past 12 months, has lack of transportation kept you from medical appointments or from getting medications?: No In the past 12 months, has lack of transportation kept you from meetings, work, or from getting things needed for daily living?: No  Home Assistive Devices / Equipment Home Assistive Devices/Equipment: None Home Equipment: None  Prior Device Use: Indicate devices/aids used by the patient prior to current illness, exacerbation or injury? None of the above  Current Functional Level Cognition  Overall Cognitive Status: Difficult to assess Difficult to assess due to: Intubated Orientation Level: Oriented X4 General Comments: pt is very lethargic, does not verbalize during session, able to follow commands that are not limited by increased fatigue    Extremity Assessment (includes Sensation/Coordination)  Upper Extremity Assessment: Generalized weakness RUE Deficits / Details:  very close to full strength LUE Deficits / Details: very close to full strength  Lower Extremity Assessment: Defer to PT evaluation    ADLs  Overall ADL's : Needs assistance/impaired Eating/Feeding: NPO Grooming: Bed level, Wash/dry face Grooming Details (indicate cue type and reason): upright chair position in bed, pt washing face with setup given increased time Upper Body Bathing: Minimal assistance, Sitting Lower Body Bathing: Minimal assistance, Bed level, Sitting/lateral leans Lower Body Bathing Details (indicate cue type and reason): upright in bed, applying lotion to BLEs with increased time Upper Body Dressing : Minimal assistance, Sitting Upper Body Dressing Details (indicate cue type and reason): new gown Lower Body Dressing: Maximal assistance, Sit to/from stand, +2 for physical assistance, +2 for safety/equipment Toilet Transfer: Minimal assistance, +2 for physical assistance, +2 for safety/equipment Toilet Transfer Details (indicate cue type and reason): simulated side stepping to HOB Toileting- Clothing Manipulation and Hygiene: Total assistance, Bed level Toileting - Clothing Manipulation Details (indicate cue type and reason): Pt with condom cath Functional mobility during ADLs: Minimal assistance, Moderate assistance, +2 for physical assistance, +2 for safety/equipment General ADL Comments: pt limited by anxiety, cueing for PLB to decrease RR    Mobility  Overal bed mobility: Needs Assistance Bed Mobility: Sit to Supine Supine to sit: Mod assist, +2 for safety/equipment Sit to supine: Total assist General bed mobility comments: requires trunk and LE support to return to bed    Transfers  Overall transfer level: Needs assistance Equipment used: 1 person hand held assist Transfers: Bed to chair/wheelchair/BSC Sit to Stand: Min assist, +2 physical assistance, +2 safety/equipment Bed to/from chair/wheelchair/BSC transfer type:: Squat pivot Squat pivot transfers: Total  assist Step pivot transfers: Min assist, +2 safety/equipment General transfer comment: recliner to bed, increased fatigue necessitating total A for pivoting from chair back to bed    Ambulation / Gait / Stairs / Wheelchair Mobility  Ambulation/Gait Ambulation/Gait assistance: Min assist, +2 safety/equipment (+3 for vent and chair follow) Gait Distance (Feet): 50 Feet Assistive device: Rolling walker (2 wheels) Gait Pattern/deviations: Step-through pattern, Decreased stride length General Gait Details: unable    Posture / Balance Dynamic Sitting Balance Sitting balance - Comments: requires increased assistance to sit at edge of recliner to pivot back to bed Balance Overall balance assessment: Needs assistance Sitting-balance support: No upper extremity supported Sitting balance-Leahy Scale: Zero Sitting balance - Comments: requires increased assistance to   sit at edge of recliner to pivot back to bed Standing balance support: Bilateral upper extremity supported, During functional activity Standing balance-Leahy Scale: Poor Standing balance comment: relies on BUE support    Special needs/care consideration Skin intact and Special service needs none    Previous Home Environment (from acute therapy documentation) Living Arrangements: Spouse/significant other  Lives With: Spouse Available Help at Discharge: Family Type of Home: House Home Layout: One level Home Access: Level entry Entrance Stairs-Rails: None Entrance Stairs-Number of Steps: 0 Bathroom Shower/Tub: Tub/shower unit Bathroom Toilet: Standard Bathroom Accessibility: Yes How Accessible: Accessible via walker Home Care Services: No  Discharge Living Setting Plans for Discharge Living Setting: House Type of Home at Discharge: House Discharge Home Layout: One level Discharge Home Access: Stairs to enter Entrance Stairs-Rails: None Entrance Stairs-Number of Steps: 6 Discharge Bathroom Shower/Tub: Tub/shower  unit Discharge Bathroom Toilet: Standard Discharge Bathroom Accessibility: Yes How Accessible: Accessible via walker Does the patient have any problems obtaining your medications?: No  Social/Family/Support Systems Patient Roles: Spouse Contact Information: [phone] Anticipated Caregiver: Sopheap Takeda Caregiver Availability: 24/7 Discharge Plan Discussed with Primary Caregiver: Yes Is Caregiver In Agreement with Plan?: No Does Caregiver/Family have Issues with Lodging/Transportation while Pt is in Rehab?: No  Goals Patient/Family Goal for Rehab: PT/OT/SLP Min A Expected length of stay: 18-21 days Pt/Family Agrees to Admission and willing to participate: Yes Program Orientation Provided & Reviewed with Pt/Caregiver Including Roles  & Responsibilities: Yes  Decrease burden of Care through IP rehab admission: not anticipated  Possible need for SNF placement upon discharge: not anticipated  Patient Condition: I have reviewed medical records from [facility] , spoken with CM, and patient and spouse. I met with patient at the bedside for inpatient rehabilitation assessment.  Patient will benefit from ongoing PT, OT, and SLP, can actively participate in 3 hours of therapy a day 5 days of the week, and can make measurable gains during the admission.  Patient will also benefit from the coordinated team approach during an Inpatient Acute Rehabilitation admission.  The patient will receive intensive therapy as well as Rehabilitation physician, nursing, social worker, and care management interventions.  Due to bladder management, bowel management, safety, skin/wound care, disease management, medication administration, pain management, and patient education the patient requires 24 hour a day rehabilitation nursing.  The patient is currently min A- min G  with mobility and basic ADLs.  Discharge setting and therapy post discharge at home with home health is anticipated.  Patient has  agreed to participate in the Acute Inpatient Rehabilitation Program and will admit today.  Preadmission Screen Completed By:  Demichael Traum B Saron Vanorman, 07/04/2022 2:33 PM ______________________________________________________________________   Discussed status with Dr. Engler on 07/07/22 at 1014 and received approval for admission today.  Admission Coordinator:  Japhet Morgenthaler B Weronika Birch, CCC-SLP, time 1014 /Date 07/07/22   Assessment/Plan: Diagnosis: Does the need for close, 24 hr/day Medical supervision in concert with the patient's rehab needs make it unreasonable for this patient to be served in a less intensive setting? Yes Co-Morbidities requiring supervision/potential complications: Mysesthenia gravis crisis s/p IVIG, respiratory failure, dysphagia s/p cortrak, hypertension, tachycardia, anxiety, insomnia, Dm2, anemia, and anxiety Due to bladder management, bowel management, safety, skin/wound care, disease management, medication administration, pain management, and patient education, does the patient require 24 hr/day rehab nursing? Yes Does the patient require coordinated care of a physician, rehab nurse, PT, OT, and SLP to address physical and functional deficits in the context of the above medical diagnosis(es)? Yes   Addressing deficits in the following areas: balance, endurance, locomotion, strength, transferring, bowel/bladder control, bathing, dressing, feeding, grooming, toileting, cognition, swallowing, and psychosocial support Can the patient actively participate in an intensive therapy program of at least 3 hrs of therapy 5 days a week? Yes The potential for patient to make measurable gains while on inpatient rehab is good Anticipated functional outcomes upon discharge from inpatient rehab: min assist PT, min assist OT, min assist SLP Estimated rehab length of stay to reach the above functional goals is: 18-21 days Anticipated discharge destination: Home 10. Overall Rehab/Functional Prognosis: good   MD  Signature:  Morgan C Engler, DO 07/07/2022  

## 2022-07-07 NOTE — Discharge Summary (Addendum)
[name] NFA:[id number] DOB: 08-29-1955 DOA: 06/21/2022  PCP: [place], [name], MD  Admit date: 06/21/2022  Discharge date: 07/07/2022  Admitted From: Home   Disposition:  CIR   Recommendations for Outpatient Follow-up:   Follow up with PCP in 1-2 weeks  PCP Please obtain BMP/CBC, 2 view CXR in 1week,  (see Discharge instructions)   PCP Please follow up on the following pending results:    Home Health: None   Equipment/Devices: None  Consultations: Neuro, PCCM Discharge Condition: Stable    CODE STATUS: Full    Diet Recommendation: Dysphagia 3 diet with feeding assistance and aspiration precautions   Chief Complaint  Patient presents with   Shortness of Breath     Brief history of present illness from the day of admission and additional interim summary    67 year old man w/ hx of MG, malignant thymoma s/p resection, DM type II, hypertension who was recently admitted to the hospital for myasthenia crisis and discharged home on 06/19/2022, presented again to the hospital few days later on 06/21/2022 for shortness of breath again due to myasthenia crisis this time needed to be intubated and admitted to ICU.     He was seen by neurology, and ICU team, has finished his course of IVIG treatment, was intubated extubated twice this admission, still quite weak, feeding via core track, weak cough reflex, transferred to hospitalist service on 07/03/2022 and under my care on 07/04/2022 on day 13 of hospital stay.   Significant Hospital Events:    5/22 admit 5/23 given Mg for replenishment, which exacerbated his MG symptoms. Extubation postponed. 5/24 pt extubate, reintubated. NIFs were -10 to -15 before reintubation. 5/27 extubated and reintubated 9h later due to ineffective cough.  5/28 MRI negative for stroke.  5/30  extubated. 6/1 back on IV diltiazem for hypertension and tachycardia.  Gradually improving strength. CTA.  No PE, some atelectasis Lower extremity venous duplex.  No DVT in bilateral lower extremities                                                                 Hospital Course   Medications that may worsen or trigger MG exacerbation: Class IA antiarrhythmics, magnesium, flouroquinolones, macrolides, aminoglycosides, penicillamine, curare, interferon alpha, botox, quinine. Use with caution: calcium channel blockers, beta blockers and statins.      Acute hypoxemic and hypercarbic respiratory failure due to recurrent MG crisis with unclear trigger.  Now required 2nd reintubation.  Patient has refused tracheostomy and PICC placement. Anxiety/PTSD component as well.  History of thymoma & past history of resection.  -Seen by neurology and PCCM, intubated extubated x 2 this admission, finished IVIG treatment  x 5, now on prednisone and Mestinon, still quite weak, monitor NIF and vital capacity, advance activity, PT OT speech.  Neurology  has signed off, PCCM signed off, may require SNF/CIR.  Still quite weak overall but definite gradual improvement, currently on dysphagia 3 diet by mouth along with core track tube feeds.  Continue, require extensive PT OT speech therapy, transfer to CIR.   07/07/22   NIF -40. VC 1.2L.     Normocytic anemia.  Check anemia panel and monitor.  Type screen done, monitor at CIR.   Weakness and dysphagia.  Due to #1 above.  Feeding via core track tube, speech following, advance activity, encouraged to sit in chair in the daytime, monitor oral diet and calorie intake.     Extreme anxiety.  Placed on Klonopin, Paxil and Xanax.  Anxiety has improved.   Sinus tachycardia/hypertension due to exertion/anxiety  - likely should have autonomic dysfunction from myasthenia gravis along with extreme anxiety, TSH is stable, he is otherwise symptom-free, CTA chest and lower extremity  venous duplex negative for any clots.  Recent echocardiogram with preserved EF and no acute findings, I checked with neurology Dr. Darlina Rumpf on 07/07/2022, will try low-dose Cardizem can be safely tried with his underlying myasthenia gravis since we have no other options for his tachycardia which is persistent and present for several weeks now.  Note he was on Cardizem for several days in ICU as well prior to moving to the floor.  Monitor for any signs of decline in his NIF or vital capacity, if any worsening discontinue Cardizem.     DM2 without hyperglycemia  - Sliding Scale insulin    Discharge diagnosis     Principal Problem:   Myasthenic crisis [facility]) Active Problems:   Compromised airway   Acute respiratory failure (HCC)   Protein-calorie malnutrition, severe    Discharge instructions    Discharge Instructions     Discharge instructions   Complete by: As directed    Follow with Primary MD [place], [name], MD in 7 days   Get CBC, CMP, 2 view Chest X ray -  checked next visit with your primary MD or CIR MD    Activity: As tolerated with Full fall precautions use walker/cane & assistance as needed  Disposition CIR  Diet: Dysphagia 3 diet with feeding assistance and aspiration precautions, continue core track tube feeds.  Special Instructions: If you have smoked or chewed Tobacco  in the last 2 yrs please stop smoking, stop any regular Alcohol  and or any Recreational drug use.  On your next visit with your primary care physician please Get Medicines reviewed and adjusted.  Please request your Prim.MD to go over all Hospital Tests and Procedure/Radiological results at the follow up, please get all Hospital records sent to your Prim MD by signing hospital release before you go home.  If you experience worsening of your admission symptoms, develop shortness of breath, life threatening emergency, suicidal or homicidal thoughts you must seek medical attention immediately by calling  [phone] or calling your MD immediately  if symptoms less severe.  You Must read complete instructions/literature along with all the possible adverse reactions/side effects for all the Medicines you take and that have been prescribed to you. Take any new Medicines after you have completely understood and accpet all the possible adverse reactions/side effects.   Increase activity slowly   Complete by: As directed        Discharge Medications   Allergies as of 07/07/2022       Reactions   Azithromycin Other (See Comments)   REACTION: exac of his mg   Beta  Adrenergic Blockers Other (See Comments)   REACTION: exac of mg        Medication List     STOP taking these medications    fluocinonide ointment 0.05 % Commonly known as: LIDEX   FLUoxetine 20 MG capsule Commonly known as: PROZAC   ibuprofen 200 MG tablet Commonly known as: ADVIL   metFORMIN 500 MG tablet Commonly known as: GLUCOPHAGE   olmesartan 20 MG tablet Commonly known as: BENICAR   omeprazole 20 MG capsule Commonly known as: PRILOSEC   repaglinide 0.5 MG tablet Commonly known as: PRANDIN   sitaGLIPtin 100 MG tablet Commonly known as: Januvia   zolpidem 10 MG tablet Commonly known as: AMBIEN       TAKE these medications    ALPRAZolam 0.5 MG tablet Commonly known as: XANAX Take 1 tablet (0.5 mg total) by mouth 3 (three) times daily as needed for anxiety.   calcium carbonate 600 MG Tabs tablet Commonly known as: OS-CAL Take 600 mg by mouth 3 (three) times daily with meals.   clonazePAM 1 MG tablet Commonly known as: KLONOPIN Take 1 tablet (1 mg total) by mouth 2 (two) times daily.   colesevelam 625 MG tablet Commonly known as: WELCHOL Take 3 tablets (1,875 mg total) by mouth daily.   desonide 0.05 % gel Commonly known as: DESONATE Apply 1 application topically 2 (two) times daily as needed.   diltiazem 60 MG tablet Commonly known as: CARDIZEM 1 tablet (60 mg total) by Per NG tube route  every 8 (eight) hours.   entecavir 1 MG tablet Commonly known as: BARACLUDE Take 1 tablet (1 mg total) by mouth daily.   feeding supplement Liqd Take 237 mLs by mouth 3 (three) times daily between meals.   free water Soln Place 100 mLs into feeding tube every 6 (six) hours.   insulin aspart 100 UNIT/ML FlexPen Commonly known as: NOVOLOG Before each meal 3 times a day, 140-199 - 2 units, 200-250 - 4 units, 251-299 - 6 units,  300-349 - 8 units,  350 or above 10 units.   OneTouch Ultra test strip Generic drug: glucose blood USE TO TEST BLOOD SUGAR 3 TIMES DAILY   onetouch ultrasoft lancets USE AS INSTRUCTED TO CHECK BLOOD SUGARS THREE TIMES A DAY   pantoprazole 40 MG tablet Commonly known as: PROTONIX Take 1 tablet (40 mg total) by mouth daily.   PARoxetine 20 MG tablet Commonly known as: PAXIL Take 1 tablet (20 mg total) by mouth daily. Start taking on: July 08, 2022   pimecrolimus 1 % cream Commonly known as: ELIDEL Apply 1 application topically 2 (two) times daily as needed.   predniSONE 5 MG tablet Commonly known as: DELTASONE Take 10 mg by mouth as directed.   pyridostigmine 60 MG tablet Commonly known as: MESTINON Take 60 mg by mouth 3 (three) times daily.   Vitamin D 50 MCG (2000 UT) tablet Take 2,000 Units by mouth daily.         Follow-up Information     [facility]. Schedule an appointment as soon as possible for a visit in 1 week(s).   Contact information: [address]     [address] [place] [address] [phone]        [place], [name], MD. Schedule an appointment as soon as possible for a visit in 1 week(s).   Specialty: Family Medicine Contact information: [address] [place] [address] [phone]  Major procedures and Radiology Reports - PLEASE review detailed and final reports thoroughly  -      VAS [place] LOWER EXTREMITY VENOUS (DVT)  Result Date:  07/06/2022  Lower Venous DVT Study Patient Name:  [name]  Date of Exam:   07/06/2022 Medical Rec #: [id number]       Accession #:    [id number] Date of Birth: 1955/11/14       Patient Gender: M Patient Age:   65 years Exam Location:  [facility] Procedure:      VAS [place] LOWER EXTREMITY VENOUS (DVT) Referring Phys: Bess Harvest [facility] --------------------------------------------------------------------------------  Indications: D-dimer 2.14, suspicion for PE.  Comparison Study: CTA performed today negative for PE Performing Technologist: Jean Rosenthal RDMS, RVT  Examination Guidelines: A complete evaluation includes B-mode imaging, spectral Doppler, color Doppler, and power Doppler as needed of all accessible portions of each vessel. Bilateral testing is considered an integral part of a complete examination. Limited examinations for reoccurring indications may be performed as noted. The reflux portion of the exam is performed with the patient in reverse Trendelenburg.  +---------+---------------+---------+-----------+----------+--------------+ RIGHT    CompressibilityPhasicitySpontaneityPropertiesThrombus Aging +---------+---------------+---------+-----------+----------+--------------+ CFV      Full           Yes      Yes                                 +---------+---------------+---------+-----------+----------+--------------+ SFJ      Full                                                        +---------+---------------+---------+-----------+----------+--------------+ FV Prox  Full                                                        +---------+---------------+---------+-----------+----------+--------------+ FV Mid   Full                                                        +---------+---------------+---------+-----------+----------+--------------+ FV DistalFull                                                         +---------+---------------+---------+-----------+----------+--------------+ PFV      Full                                                        +---------+---------------+---------+-----------+----------+--------------+ POP      Full           Yes      Yes                                 +---------+---------------+---------+-----------+----------+--------------+  PTV      Full                                                        +---------+---------------+---------+-----------+----------+--------------+ PERO     Full                                                        +---------+---------------+---------+-----------+----------+--------------+   +---------+---------------+---------+-----------+----------+--------------+ LEFT     CompressibilityPhasicitySpontaneityPropertiesThrombus Aging +---------+---------------+---------+-----------+----------+--------------+ CFV      Full           Yes      Yes                                 +---------+---------------+---------+-----------+----------+--------------+ SFJ      Full                                                        +---------+---------------+---------+-----------+----------+--------------+ FV Prox  Full                                                        +---------+---------------+---------+-----------+----------+--------------+ FV Mid   Full                                                        +---------+---------------+---------+-----------+----------+--------------+ FV DistalFull                                                        +---------+---------------+---------+-----------+----------+--------------+ PFV      Full                                                        +---------+---------------+---------+-----------+----------+--------------+ POP      Full           Yes      Yes                                  +---------+---------------+---------+-----------+----------+--------------+ PTV      Full                                                        +---------+---------------+---------+-----------+----------+--------------+  PERO     Full                                                        +---------+---------------+---------+-----------+----------+--------------+     Summary: RIGHT: - There is no evidence of deep vein thrombosis in the lower extremity.  - No cystic structure found in the popliteal fossa.  LEFT: - There is no evidence of deep vein thrombosis in the lower extremity.  - No cystic structure found in the popliteal fossa.  *See table(s) above for measurements and observations. Electronically signed by Matthew Hess MD on 07/06/2022 at 1:44:50 PM.    Final    CT Angio Chest Pulmonary Embolism (PE) W or WO Contrast  Result Date: 07/06/2022 CLINICAL DATA:  Pulmonary embolism (PE) suspected, high prob EXAM: CT ANGIOGRAPHY CHEST WITH CONTRAST TECHNIQUE: Multidetector CT imaging of the chest was performed using the standard protocol during bolus administration of intravenous contrast. Multiplanar CT image reconstructions and MIPs were obtained to evaluate the vascular anatomy. RADIATION DOSE REDUCTION: This exam was performed according to the departmental dose-optimization program which includes automated exposure control, adjustment of the mA and/or kV according to patient size and/or use of iterative reconstruction technique. CONTRAST:  75mL OMNIPAQUE IOHEXOL 350 MG/ML SOLN COMPARISON:  CT chest Jun 21, 2022 FINDINGS: Cardiovascular: Satisfactory opacification of the pulmonary arteries to the segmental level. No evidence of pulmonary embolism. The thoracic aorta is normal in caliber. Normal heart size. No pericardial effusion. Mediastinum/Nodes: Enteric tube courses below the GE junction with the distal tip not visualized. No enlarged mediastinal, hilar, or axillary lymph nodes. The thyroid  gland appears normal. Lungs/Pleura: No pleural effusion. No pneumothorax. Subsegmental atelectasis of the left lower lobe dependent lung. No suspicious pulmonary nodules. Musculoskeletal: No aggressive osseous lesions. Status post median sternotomy. Upper abdomen: The visualized upper abdomen is unremarkable. Review of the MIP images confirms the above findings. IMPRESSION: 1. No findings of pulmonary embolism. 2. There is subsegmental atelectasis of the dependent left lower lobe. Otherwise, no acute findings in the chest. Electronically Signed   By: [name] M.D.   On: 07/06/2022 09:26   DG Chest Port 1 View  Result Date: 07/05/2022 CLINICAL DATA:  Acute respiratory distress. EXAM: PORTABLE CHEST 1 VIEW COMPARISON:  07/04/2022. FINDINGS: The heart size and mediastinal contours are stable. There is atherosclerotic calcification of the aorta. Lung volumes are low with mild atelectasis at the lung bases. There is a small left pleural effusion. No pneumothorax. An enteric tube courses over the stomach and out of the field of view. Sternotomy wires are present over the midline. IMPRESSION: 1. Low lung volumes with atelectasis at the lung bases. 2. Small left pleural effusion. Electronically Signed   By: [name] M.D.   On: 07/05/2022 22:13   DG Swallowing Func-Speech Pathology  Result Date: 07/04/2022 Table formatting from the original result was not included. Modified Barium Swallow Study Patient Details Name: [name] MRN: [id number] Date of Birth: 1955-11-24 Today's Date: 07/04/2022 HPI/PMH: HPI: 66/M, hx seropositive myasthenia gravis recently admitted for exacerbation. He improved on IVIG and was discharged to home on 5/20, at which point his respiratory function was vastly improved although he did have some residual ptosis. He then re-presented on 5/22, 3 days after discharge, with respiratory distress and NIF -19. He was reintubated in  the ED due to concerns for increasing hypercarbia on his  blood gases. He was started on a repeat course of IVIG after weighing IVIG versus PLEX.  5/22 admit, intubated, 5/23 given Mg for replenishment, which exacerbated his MG symptoms. Extubation postponed. 5/24 pt extubate, reintubated. NIFs were -10 to -15 before reintubation. 5/27 extubated and reintubated 9h later due to ineffective cough. 5/28 MRI negative for stroke. Extubated 5/30 Clinical Impression: Clinical Impression: Pt presents with a mild to moderate oropharyngeal dysphagia with sluggish and repetitive lingual function resulting in prolonged oral phase and mild lingual and floor of mouth residue post swallow. Timing of swallowing initiation relatively good; though there are instances of thin liquids arriving just barely to pyriforms prior to hyoid burst. The primary pharyngeal impairment is mild weakness resulting in decreased epiglottic deflection; pt appears to not be able to overcome presence of NG tube. This leads to moderate vallecular and high lateral channel residue, more with solids and nectar thick liquids than thin liquids. Pt is sensate to one very trace instance of frank penetration. Protected airway throughout. He does appear to have ongoing generalized weakness and waxing and waning function. Pt can initiate thin liquids and mechanical soft solids, following bites with sips. Can attempt meds orally with thin liquids. Would not remove NG tube yet until he's consistently accepting PO. Ok for family to bring food from home. Would benefit from up to chair for meals or chair position in bed. Factors that may increase risk of adverse event in presence of aspiration [name] 2021): Factors that may increase risk of adverse event in presence of aspiration [name] 2021): Weak cough; Inadequate oral hygiene; Frail or deconditioned; Limited mobility; Reduced cognitive function; Poor general health and/or compromised immunity Recommendations/Plan: Swallowing Evaluation Recommendations  Swallowing Evaluation Recommendations Liquid Administration via: Cup; Straw Medication Administration: Whole meds with liquid Supervision: Staff to assist with self-feeding; Full assist for feeding Swallowing strategies  : Slow rate; Small bites/sips; Follow solids with liquids Postural changes: Position pt fully upright for meals; Stay upright 30-60 min after meals Treatment Plan Treatment Plan Treatment recommendations: Therapy as outlined in treatment plan below Follow-up recommendations: Acute inpatient rehab (3 hours/day) Functional status assessment: Patient has had a recent decline in their functional status and demonstrates the ability to make significant improvements in function in a reasonable and predictable amount of time. Treatment frequency: Min 2x/week Treatment duration: 2 weeks Interventions: Aspiration precaution training; Compensatory techniques; Respiratory muscle strength training; Diet toleration management by SLP; Patient/family education Recommendations Recommendations for follow up therapy are one component of a multi-disciplinary discharge planning process, led by the attending physician.  Recommendations may be updated based on patient status, additional functional criteria and insurance authorization. Assessment: Orofacial Exam: Orofacial Exam Oral Cavity: Oral Hygiene: Xerostomia Oral Cavity - Dentition: Adequate natural dentition Orofacial Anatomy: WFL Anatomy: No data recorded Boluses Administered: Boluses Administered Boluses Administered: Thin liquids (Level 0); Mildly thick liquids (Level 2, nectar thick); Puree; Solid  Oral Impairment Domain: Oral Impairment Domain Lip Closure: No labial escape Tongue control during bolus hold: Escape to lateral buccal cavity/floor of mouth Bolus preparation/mastication: Slow prolonged chewing/mashing with complete recollection Bolus transport/lingual motion: Slow tongue motion Oral residue: Residue collection on oral structures Location of oral  residue : Floor of mouth; Tongue Initiation of pharyngeal swallow : Pyriform sinuses  Pharyngeal Impairment Domain: Pharyngeal Impairment Domain Soft palate elevation: No bolus between soft palate (SP)/pharyngeal wall (PW) Laryngeal elevation: Complete superior movement of thyroid cartilage with complete  approximation of arytenoids to epiglottic petiole Anterior hyoid excursion: Complete anterior movement Epiglottic movement: Partial inversion Laryngeal vestibule closure: Complete, no air/contrast in laryngeal vestibule Pharyngeal stripping wave : Present - complete Pharyngeal contraction (A/P view only): N/A Pharyngoesophageal segment opening: Complete distension and complete duration, no obstruction of flow Tongue base retraction: Trace column of contrast or air between tongue base and PPW Pharyngeal residue: Collection of residue within or on pharyngeal structures Location of pharyngeal residue: Valleculae; Aryepiglottic folds  Esophageal Impairment Domain: No data recorded Pill: No data recorded Penetration/Aspiration Scale Score: Penetration/Aspiration Scale Score 1.  Material does not enter airway: Thin liquids (Level 0); Mildly thick liquids (Level 2, nectar thick); Puree; Solid 4.  Material enters airway, CONTACTS cords then ejected out: Thin liquids (Level 0) Compensatory Strategies: Compensatory Strategies Compensatory strategies: No   General Information: Caregiver present: No  Diet Prior to this Study: NPO; Cortrak/Small bore NG tube   Temperature : Normal   Respiratory Status: WFL   Supplemental O2: None (Room air)   History of Recent Intubation: Yes  Behavior/Cognition: Lethargic/Drowsy; Requires cueing; Cooperative Self-Feeding Abilities: Needs hand-over-hand assist for feeding Baseline vocal quality/speech: Hypophonia/low volume Volitional Cough: Able to elicit Volitional Swallow: Able to elicit Exam Limitations: Fatigue Goal Planning: Prognosis for improved oropharyngeal function: Good No data  recorded No data recorded No data recorded Consulted and agree with results and recommendations: Pt unable/family or caregiver not available Pain: Pain Assessment Pain Assessment: Faces Faces Pain Scale: 2 Breathing: 1 Negative Vocalization: 0 Facial Expression: 1 Body Language: 0 Consolability: 1 PAINAD Score: 3 Facial Expression: 0 Body Movements: 2 Muscle Tension: 0 Compliance with ventilator (intubated pts.): N/A Vocalization (extubated pts.): 0 CPOT Total: 2 Pain Intervention(s): Monitored during session End of Session: Start Time:SLP Start Time (ACUTE ONLY): 0930 Stop Time: SLP Stop Time (ACUTE ONLY): 0950 Time Calculation:SLP Time Calculation (min) (ACUTE ONLY): 20 min Charges: SLP Evaluations $ SLP Speech Visit: 1 Visit SLP Evaluations $MBS Swallow: 1 Procedure $FEES Swallow: 1 Procedure $Swallowing Treatment: 1 Procedure SLP visit diagnosis: SLP Visit Diagnosis: Dysphagia, oropharyngeal phase (R13.12) Past Medical History: Past Medical History: Diagnosis Date  Anxiety   Colon polyp   Depressive disorder, not elsewhere classified   External hemorrhoid   Insomnia, unspecified   Internal hemorrhoids   Myasthenia gravis without exacerbation (HCC)   Other and unspecified hyperlipidemia   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)   Type II or unspecified type diabetes mellitus without mention of complication, not stated as uncontrolled   Viral hepatitis B without mention of hepatic coma, chronic, without mention of hepatitis delta  Past Surgical History: Past Surgical History: Procedure Laterality Date  THYMECTOMY   Matthew Ditty, MA CCC-SLP [facility] Secure Chat Preferred Office [phone] [name] 07/04/2022, 10:20 AM  DG Chest Port 1 View  Result Date: 07/04/2022 CLINICAL DATA:  Shortness of breath EXAM: PORTABLE CHEST 1 VIEW COMPARISON:  06/26/2022 FINDINGS: A feeding tube at least reaches the stomach. Artifact from EKG leads. Normal heart size and mediastinal  contours. No acute infiltrate or edema. No effusion or pneumothorax. No acute osseous findings. IMPRESSION: No evidence of active disease. Electronically Signed   By: Tiburcio Pea M.D.   On: 07/04/2022 07:28   MR BRAIN WO CONTRAST  Result Date: 06/27/2022 CLINICAL DATA:  Stroke suspected EXAM: MRI HEAD WITHOUT CONTRAST TECHNIQUE: Multiplanar, multiecho pulse sequences of the brain and surrounding structures were obtained without intravenous contrast. COMPARISON:  No prior MRI available, correlation made with CT head  09/17/2017 FINDINGS: Brain: No restricted diffusion to suggest acute or subacute infarct. No acute hemorrhage, mass, mass effect, or midline shift. No hydrocephalus or extra-axial collection. Normal pituitary and craniocervical junction. No hemosiderin deposition to suggest remote hemorrhage. Scattered T2 hyperintense signal in the periventricular white matter, likely the sequela of mild chronic small vessel ischemic disease. Vascular: Normal arterial flow voids. Skull and upper cervical spine: Normal marrow signal. Sinuses/Orbits: Mucous retention cysts in the maxillary sinuses. Otherwise clear paranasal sinuses. Air-fluid level in the nasopharynx, likely secondary to intubation. Status post bilateral lens replacements. Other: Trace fluid in the mastoid air cells. IMPRESSION: No acute intracranial process. No evidence of acute or subacute infarct. Electronically Signed   By: [name] M.D.   On: 06/27/2022 22:21   Portable Chest x-ray  Result Date: 06/26/2022 CLINICAL DATA:  ET tube EXAM: PORTABLE CHEST 1 VIEW COMPARISON:  06/24/2022 FINDINGS: Endotracheal tube and feeding tube are in stable position. Increasing right infrahilar airspace opacity. Left lung clear. No effusions. Heart and mediastinal contours within normal limits. Prior CABG. IMPRESSION: Support devices stable. Increasing right infrahilar atelectasis or infiltrate. Electronically Signed   By: Charlett Nose M.D.   On:  06/26/2022 21:11   [place] EKG SITE RITE  Result Date: 06/25/2022 If Site Rite image not attached, placement could not be confirmed due to current cardiac rhythm.  DG CHEST PORT 1 VIEW  Result Date: 06/24/2022 CLINICAL DATA:  Fever. EXAM: PORTABLE CHEST 1 VIEW COMPARISON:  Radiograph yesterday. CT 06/21/2022 FINDINGS: The endotracheal tube tip is 3.8 cm from the carina. Weighted enteric tube tip below the diaphragm not included in the field of view. No evidence of focal airspace disease. Stable heart size and mediastinal contours. No significant pleural effusion or pneumothorax. Normal pulmonary vasculature. Stable osseous structures. IMPRESSION: 1. Endotracheal tube tip 3.8 cm from the carina. 2. No focal airspace disease or explanation for fever. Electronically Signed   By: Narda Rutherford M.D.   On: 06/24/2022 19:24   DG CHEST PORT 1 VIEW  Result Date: 06/23/2022 CLINICAL DATA:  [id number] Endotracheally intubated [id number] EXAM: PORTABLE CHEST 1 VIEW COMPARISON:  Radiograph 06/22/2022 FINDINGS: Endotracheal tube overlies the trachea above the thoracic inlet. Unchanged cardiomediastinal silhouette. Intact sternotomy wires. Surgical clips overlie the mediastinum. There is no focal airspace consolidation. There is no pleural effusion or evidence of pneumothorax. Bones are unchanged. IMPRESSION: Endotracheal tube has retracted, tip overlies the trachea above the thoracic inlet, could advance by 5.0-6.0 cm. No new airspace disease. Electronically Signed   By: Caprice Renshaw M.D.   On: 06/23/2022 16:17   DG Abd 1 View  Result Date: 06/23/2022 CLINICAL DATA:  [id number] Endotracheally intubated [id number] EXAM: ABDOMEN - 1 VIEW COMPARISON:  06/23/2022 FINDINGS: Feeding tube tip overlies the distal stomach. Nonobstructive bowel gas pattern. IMPRESSION: Feeding tube tip overlies the distal stomach. Nonobstructive bowel gas pattern. Electronically Signed   By: Caprice Renshaw M.D.   On: 06/23/2022 16:13   DG Abd Portable  1V  Result Date: 06/23/2022 CLINICAL DATA:  Feeding tube placement. EXAM: PORTABLE ABDOMEN - 1 VIEW COMPARISON:  January 27, 2007. FINDINGS: Distal tip of feeding tube is seen in expected position of distal stomach. No abnormal bowel dilatation. IMPRESSION: Distal tip of feeding tube seen in expected position of distal stomach. Electronically Signed   By: Lupita Raider M.D.   On: 06/23/2022 12:32   ECHOCARDIOGRAM COMPLETE  Result Date: 06/22/2022    ECHOCARDIOGRAM REPORT   Patient Name:   [name] Date  of Exam: 06/22/2022 Medical Rec #:  [id number]      Height:       65.0 in Accession #:    [id number]     Weight:       115.7 lb Date of Birth:  14-Jul-1955      BSA:          1.568 m Patient Age:    66 years       BP:           105/79 mmHg Patient Gender: M              HR:           82 bpm. Exam Location:  Inpatient Procedure: 2D Echo, Cardiac Doppler and Color Doppler Indications:    Ascending Aorta Aneurysm I71  History:        Patient has no prior history of Echocardiogram examinations.                 Risk Factors:Hypertension, Diabetes and Dyslipidemia.  Sonographer:    Lucendia Herrlich Referring Phys: Val Riles SOOD IMPRESSIONS  1. Left ventricular ejection fraction, by estimation, is 60 to 65%. The left ventricle has normal function. The left ventricle has no regional wall motion abnormalities. There is mild concentric left ventricular hypertrophy. Left ventricular diastolic parameters are consistent with Grade I diastolic dysfunction (impaired relaxation).  2. Right ventricular systolic function is normal. The right ventricular size is normal.  3. The mitral valve is normal in structure. No evidence of mitral valve regurgitation. No evidence of mitral stenosis.  4. The aortic valve is normal in structure. Aortic valve regurgitation is trivial. No aortic stenosis is present.  5. Aortic dilatation noted. There is dilatation of the aortic root, measuring 39 mm. There is dilatation of the ascending  aorta, measuring 41 mm.  6. The inferior vena cava is dilated in size with <50% respiratory variability, suggesting right atrial pressure of 15 mmHg. FINDINGS  Left Ventricle: Left ventricular ejection fraction, by estimation, is 60 to 65%. The left ventricle has normal function. The left ventricle has no regional wall motion abnormalities. The left ventricular internal cavity size was normal in size. There is  mild concentric left ventricular hypertrophy. Left ventricular diastolic parameters are consistent with Grade I diastolic dysfunction (impaired relaxation). Right Ventricle: The right ventricular size is normal. No increase in right ventricular wall thickness. Right ventricular systolic function is normal. Left Atrium: Left atrial size was normal in size. Right Atrium: Right atrial size was normal in size. Pericardium: There is no evidence of pericardial effusion. Mitral Valve: The mitral valve is normal in structure. No evidence of mitral valve regurgitation. No evidence of mitral valve stenosis. Tricuspid Valve: The tricuspid valve is normal in structure. Tricuspid valve regurgitation is not demonstrated. No evidence of tricuspid stenosis. Aortic Valve: The aortic valve is normal in structure. Aortic valve regurgitation is trivial. No aortic stenosis is present. Aortic valve peak gradient measures 3.5 mmHg. Pulmonic Valve: The pulmonic valve was normal in structure. Pulmonic valve regurgitation is not visualized. No evidence of pulmonic stenosis. Aorta: The ascending aorta was not well visualized and aortic dilatation noted. There is dilatation of the aortic root, measuring 39 mm. There is dilatation of the ascending aorta, measuring 41 mm. Venous: The inferior vena cava is dilated in size with less than 50% respiratory variability, suggesting right atrial pressure of 15 mmHg. IAS/Shunts: No atrial level shunt detected by color flow Doppler.  LEFT VENTRICLE PLAX 2D LVIDd:  3.40 cm   Diastology LVIDs:          1.90 cm   LV e' medial:    5.44 cm/s LV PW:         1.10 cm   LV E/e' medial:  8.5 LV IVS:        1.20 cm   LV e' lateral:   7.51 cm/s LVOT diam:     2.20 cm   LV E/e' lateral: 6.2 LV SV:         34 LV SV Index:   22 LVOT Area:     3.80 cm  RIGHT VENTRICLE             IVC RV S prime:     15.30 cm/s  IVC diam: 2.90 cm TAPSE (M-mode): 1.5 cm LEFT ATRIUM         Index       RIGHT ATRIUM           Index LA diam:    2.40 cm 1.53 cm/m  RA Area:     10.70 cm                                 RA Volume:   22.90 ml  14.61 ml/m  AORTIC VALVE AV Area (Vmax): 2.36 cm AV Vmax:        93.70 cm/s AV Peak Grad:   3.5 mmHg LVOT Vmax:      58.25 cm/s LVOT Vmean:     37.275 cm/s LVOT VTI:       0.091 m  AORTA Ao Root diam: 3.90 cm Ao Asc diam:  4.10 cm MITRAL VALVE               TRICUSPID VALVE MV Area (PHT): 5.02 cm    TR Peak grad:   21.7 mmHg MV Decel Time: 151 msec    TR Vmax:        233.00 cm/s MV E velocity: 46.30 cm/s MV A velocity: 57.00 cm/s  SHUNTS MV E/A ratio:  0.81        Systemic VTI:  0.09 m                            Systemic Diam: 2.20 cm [name] Electronically signed by Dorthula Nettles Signature Date/Time: 06/22/2022/4:19:43 PM    Final    DG Chest Port 1 View  Result Date: 06/22/2022 CLINICAL DATA:  Ventilator support EXAM: PORTABLE CHEST 1 VIEW COMPARISON:  06/21/2022 FINDINGS: Endotracheal tube tip 1 cm above the carina. Orogastric or nasogastric tube enters the stomach. Previous sternotomy. Surgical clips in the upper mediastinum. The lungs are clear presently. The vascularity is normal. No acute bone finding. IMPRESSION: Endotracheal tube tip 1 cm above the carina. Orogastric or nasogastric tube enters the stomach. Lungs clear. Electronically Signed   By: Paulina Fusi M.D.   On: 06/22/2022 08:23   DG Chest Port 1 View  Result Date: 06/21/2022 CLINICAL DATA:  Post intubation EXAM: PORTABLE CHEST 1 VIEW COMPARISON:  Chest x-ray 06/21/2022 FINDINGS: Post sternotomy changes. Interval  intubation, tip of the endotracheal tube is just above the carina. Esophageal tube tip at the gastric fundus. No focal airspace opacity. Stable cardiomediastinal silhouette. Possible tiny left effusion. IMPRESSION: Endotracheal tube tip just above the carina. Esophageal tube tip at the gastric fundus. Possible tiny left effusion. Electronically Signed   By:  Jasmine Pang M.D.   On: 06/21/2022 19:37   CT Angio Chest PE W and/or Wo Contrast  Result Date: 06/21/2022 CLINICAL DATA:  Shortness of breath EXAM: CT ANGIOGRAPHY CHEST WITH CONTRAST TECHNIQUE: Multidetector CT imaging of the chest was performed using the standard protocol during bolus administration of intravenous contrast. Multiplanar CT image reconstructions and MIPs were obtained to evaluate the vascular anatomy. RADIATION DOSE REDUCTION: This exam was performed according to the departmental dose-optimization program which includes automated exposure control, adjustment of the mA and/or kV according to patient size and/or use of iterative reconstruction technique. CONTRAST:  60mL OMNIPAQUE IOHEXOL 350 MG/ML SOLN COMPARISON:  CTA chest 03/24/2010 and chest radiograph 06/21/2022 FINDINGS: Cardiovascular: Sternotomy and CABG. Normal heart size. Mild dilation of the ascending aorta measuring 40 mm in diameter. Aortic atherosclerotic calcification. No pericardial effusion. Satisfactory opacification of the pulmonary arteries to the segmental level. No pulmonary embolism. Mediastinum/Nodes: Unremarkable trachea and esophagus. No thoracic adenopathy. Lungs/Pleura: Left lower lobe and lingula scarring/atelectasis. No focal consolidation, pleural effusion, or pneumothorax. 4 mm nodule in the left lower lobe (6/94) is stable since 03/04/2010 compatible with benign etiology. No follow-up recommended Upper Abdomen: No acute abnormality. Musculoskeletal: No acute abnormality. Review of the MIP images confirms the above findings. IMPRESSION: 1. No evidence of acute  pulmonary embolism or other acute findings in the chest. 2. Mild dilation of the ascending aorta measuring 40 mm in diameter. Recommend annual imaging followup by CTA or MRA. This recommendation follows 2010 ACCF/AHA/AATS/ACR/ASA/SCA/SCAI/SIR/STS/SVM Guidelines for the Diagnosis and Management of Patients with Thoracic Aortic Disease. Circulation. 2010; 121: Z610-R604. Aortic aneurysm NOS (ICD10-I71.9) Aortic Atherosclerosis (ICD10-I70.0). Electronically Signed   By: Minerva Fester M.D.   On: 06/21/2022 17:48   DG Chest 2 View  Result Date: 06/21/2022 CLINICAL DATA:  Shortness of breath. EXAM: CHEST - 2 VIEW COMPARISON:  Jun 16, 2022. FINDINGS: The heart size and mediastinal contours are within normal limits. Both lungs are clear. The visualized skeletal structures are unremarkable. Sternotomy wires are noted. IMPRESSION: No active cardiopulmonary disease. Electronically Signed   By: Lupita Raider M.D.   On: 06/21/2022 16:25   DG Chest 2 View  Result Date: 06/16/2022 CLINICAL DATA:  shortness of breath EXAM: CHEST - 2 VIEW COMPARISON:  Chest XR, 12/02/2007.  CT chest, 223 2012. FINDINGS: Cardiomediastinal silhouette is within normal limits. Surgical clips at the superior mediastinum. Lungs are well inflated. No focal consolidation or mass. No pleural effusion or pneumothorax. Median sternotomy. No acute displaced fracture. IMPRESSION: Normal chest. Electronically Signed   By: Roanna Banning M.D.   On: 06/16/2022 08:25    Micro Results     No results found for this or any previous visit (from the past 240 hour(s)).  Today   Subjective    [name] today has no headache,no chest abdominal pain,no new weakness tingling or numbness, feels much better wants to go home today.     Objective   Blood pressure (!) 144/97, pulse (!) 117, temperature 98.3 F (36.8 C), temperature source Oral, resp. rate (!) 25, height 5\' 5"  (1.651 m), weight 53.8 kg, SpO2 99 %.   Intake/Output Summary (Last 24  hours) at 07/07/2022 1013 Last data filed at 07/07/2022 0800 Gross per 24 hour  Intake 1216 ml  Output 998 ml  Net 218 ml    Exam  Awake Alert, No new F.N deficits, diffuse generalized weakness, improved cough reflex, NG tube, anxious ++  [place].AT,PERRAL Supple Neck,   Symmetrical Chest wall movement,  Good air movement bilaterally, CTAB RRR,No Gallops,   +ve B.Sounds, Abd Soft, Non tender,  No Cyanosis, Clubbing or edema    Data Review   Recent Labs  Lab 07/01/22 0201 07/04/22 0616 07/05/22 0607 07/06/22 0046 07/07/22 0540  WBC 13.2* 11.8* 8.5 8.4 7.0  HGB 10.5* 9.5* 8.0* 7.9* 7.3*  HCT 31.3* 29.3* 24.5* 24.6* 22.1*  PLT 380 396 337 314 305  MCV 85.3 88.5 88.1 88.2 88.4  MCH 28.6 28.7 28.8 28.3 29.2  MCHC 33.5 32.4 32.7 32.1 33.0  RDW 14.8 15.3 15.4 15.5 16.2*  LYMPHSABS 3.5  --  2.9 2.5 2.0  MONOABS 1.1*  --  0.5 0.5 0.5  EOSABS 0.0  --  0.2 0.2 0.2  BASOSABS 0.1  --  0.0 0.0 0.0    Recent Labs  Lab 07/01/22 0201 07/04/22 0616 07/05/22 0607 07/06/22 0046 07/07/22 0540  NA 136 143 139 136 134*  K 3.8 3.4* 3.6 3.6 4.0  CL 102 103 104 104 103  CO2 25 30 26 24 23   ANIONGAP 9 10 9 8 8   GLUCOSE 248* 214* 205* 151* 272*  BUN 31* 41* 41* 30* 20  CREATININE 0.84 1.00 0.96 0.85 0.81  AST 38  --   --   --   --   ALT 35  --   --   --   --   ALKPHOS 73  --   --   --   --   BILITOT 0.4  --   --   --   --   ALBUMIN 2.6*  --   --   --   --   CRP  --  0.6 0.7 0.6 1.2*  DDIMER  --   --   --  2.14*  --   PROCALCITON  --  0.58 0.50 0.38 0.27  TSH  --  0.411  --   --   --   BNP  --  33.2 34.6 55.8 87.5  MG  --  2.5* 2.2 2.1 2.2  CALCIUM 9.1 9.7 8.7* 8.2* 8.0*    Total Time in preparing paper work, data evaluation and todays exam - 35 minutes  Signature  -    Susa Raring M.D on 07/07/2022 at 10:13 AM   -  To page go to [email or web address]

## 2022-07-08 LAB — COMPREHENSIVE METABOLIC PANEL
ALT: 64 U/L — ABNORMAL HIGH (ref 0–44)
AST: 52 U/L — ABNORMAL HIGH (ref 15–41)
Albumin: 2.3 g/dL — ABNORMAL LOW (ref 3.5–5.0)
Alkaline Phosphatase: 48 U/L (ref 38–126)
Anion gap: 10 (ref 5–15)
BUN: 23 mg/dL (ref 8–23)
CO2: 24 mmol/L (ref 22–32)
Calcium: 8 mg/dL — ABNORMAL LOW (ref 8.9–10.3)
Chloride: 101 mmol/L (ref 98–111)
Creatinine, Ser: 0.97 mg/dL (ref 0.61–1.24)
GFR, Estimated: >60 mL/min (ref >60)
Glucose, Bld: 185 mg/dL — ABNORMAL HIGH (ref 70–99)
Potassium: 3.9 mmol/L (ref 3.5–5.1)
Sodium: 135 mmol/L (ref 135–145)
Total Bilirubin: 0.4 mg/dL (ref 0.3–1.2)
Total Protein: 5.5 g/dL — ABNORMAL LOW (ref 6.5–8.1)

## 2022-07-08 LAB — CBC WITH DIFFERENTIAL/PLATELET
Abs Immature Granulocytes: 0.05 10*3/uL (ref 0.00–0.07)
Basophils Absolute: 0 10*3/uL (ref 0.0–0.1)
Basophils Relative: 0 %
Eosinophils Absolute: 0.2 10*3/uL (ref 0.0–0.5)
Eosinophils Relative: 3 %
HCT: 21.4 % — ABNORMAL LOW (ref 39.0–52.0)
Hemoglobin: 7 g/dL — ABNORMAL LOW (ref 13.0–17.0)
Immature Granulocytes: 1 %
Lymphocytes Relative: 33 %
Lymphs Abs: 2.6 10*3/uL (ref 0.7–4.0)
MCH: 28.6 pg (ref 26.0–34.0)
MCHC: 32.7 g/dL (ref 30.0–36.0)
MCV: 87.3 fL (ref 80.0–100.0)
Monocytes Absolute: 0.5 10*3/uL (ref 0.1–1.0)
Monocytes Relative: 7 %
Neutro Abs: 4.4 10*3/uL (ref 1.7–7.7)
Neutrophils Relative %: 56 %
Platelets: 319 10*3/uL (ref 150–400)
RBC: 2.45 MIL/uL — ABNORMAL LOW (ref 4.22–5.81)
RDW: 16.5 % — ABNORMAL HIGH (ref 11.5–15.5)
WBC: 7.7 10*3/uL (ref 4.0–10.5)
nRBC: 0.3 % — ABNORMAL HIGH (ref 0.0–0.2)

## 2022-07-08 LAB — MAGNESIUM: Magnesium: 2.2 mg/dL (ref 1.7–2.4)

## 2022-07-08 LAB — GLUCOSE, CAPILLARY
Glucose-Capillary: 127 mg/dL — ABNORMAL HIGH (ref 70–99)
Glucose-Capillary: 151 mg/dL — ABNORMAL HIGH (ref 70–99)
Glucose-Capillary: 163 mg/dL — ABNORMAL HIGH (ref 70–99)
Glucose-Capillary: 211 mg/dL — ABNORMAL HIGH (ref 70–99)
Glucose-Capillary: 222 mg/dL — ABNORMAL HIGH (ref 70–99)
Glucose-Capillary: 267 mg/dL — ABNORMAL HIGH (ref 70–99)

## 2022-07-08 LAB — BRAIN NATRIURETIC PEPTIDE: B Natriuretic Peptide: 68.9 pg/mL (ref 0.0–100.0)

## 2022-07-08 LAB — PROCALCITONIN: Procalcitonin: 0.26 ng/mL

## 2022-07-08 LAB — C-REACTIVE PROTEIN: CRP: 1 mg/dL — ABNORMAL HIGH (ref <1.0)

## 2022-07-08 MED ORDER — BUSPIRONE HCL 10 MG PO TABS
5.0000 mg | ORAL_TABLET | Freq: Two times a day (BID) | ORAL | Status: DC
  Administered 2022-07-08 – 2022-07-17 (×18): 5 mg via ORAL
  Filled 2022-07-08 (×18): qty 1

## 2022-07-08 MED ORDER — CLONAZEPAM 0.5 MG PO TABS
0.5000 mg | ORAL_TABLET | Freq: Two times a day (BID) | ORAL | Status: DC
  Administered 2022-07-08 – 2022-07-18 (×20): 0.5 mg via ORAL
  Filled 2022-07-08 (×20): qty 1

## 2022-07-08 NOTE — Progress Notes (Signed)
PROGRESS NOTE   Subjective/Complaints:  Vitalls stable overnight. 2x xanax Prn used.  Family concerned regarding ongoing lethargy, but states he is overall stable and doing well.  On exam, patient requesting to get out of bed for toileting.  No concerns. Small BM overnight. No b/b incontinence.  RRT did not perform NIF this AM.  Repeat this afternoon -40, VC 1.0  ROS: Denies fevers, chills, N/V, abdominal pain, constipation, diarrhea, SOB, cough, chest pain, new weakness or paraesthesias.    Objective:   VAS [place] LOWER EXTREMITY VENOUS (DVT)  Result Date: 07/06/2022  Lower Venous DVT Study Patient Name:  [name]  Date of Exam:   07/06/2022 Medical Rec #: [id number]       Accession #:    [id number] Date of Birth: [birth year]       Patient Gender: M Patient Age:   67 years Exam Location:  [facility] Procedure:      VAS [place] LOWER EXTREMITY VENOUS (DVT) Referring Phys: [facility] --------------------------------------------------------------------------------  Indications: D-dimer 2.14, suspicion for PE.  Comparison Study: CTA performed today negative for PE Performing Technologist: Jean Rosenthal RDMS, RVT  Examination Guidelines: A complete evaluation includes B-mode imaging, spectral Doppler, color Doppler, and power Doppler as needed of all accessible portions of each vessel. Bilateral testing is considered an integral part of a complete examination. Limited examinations for reoccurring indications may be performed as noted. The reflux portion of the exam is performed with the patient in reverse Trendelenburg.  +---------+---------------+---------+-----------+----------+--------------+ RIGHT    CompressibilityPhasicitySpontaneityPropertiesThrombus Aging +---------+---------------+---------+-----------+----------+--------------+ CFV      Full           Yes      Yes                                  +---------+---------------+---------+-----------+----------+--------------+ SFJ      Full                                                        +---------+---------------+---------+-----------+----------+--------------+ FV Prox  Full                                                        +---------+---------------+---------+-----------+----------+--------------+ FV Mid   Full                                                        +---------+---------------+---------+-----------+----------+--------------+ FV DistalFull                                                        +---------+---------------+---------+-----------+----------+--------------+  PFV      Full                                                        +---------+---------------+---------+-----------+----------+--------------+ POP      Full           Yes      Yes                                 +---------+---------------+---------+-----------+----------+--------------+ PTV      Full                                                        +---------+---------------+---------+-----------+----------+--------------+ PERO     Full                                                        +---------+---------------+---------+-----------+----------+--------------+   +---------+---------------+---------+-----------+----------+--------------+ LEFT     CompressibilityPhasicitySpontaneityPropertiesThrombus Aging +---------+---------------+---------+-----------+----------+--------------+ CFV      Full           Yes      Yes                                 +---------+---------------+---------+-----------+----------+--------------+ SFJ      Full                                                        +---------+---------------+---------+-----------+----------+--------------+ FV Prox  Full                                                         +---------+---------------+---------+-----------+----------+--------------+ FV Mid   Full                                                        +---------+---------------+---------+-----------+----------+--------------+ FV DistalFull                                                        +---------+---------------+---------+-----------+----------+--------------+ PFV      Full                                                        +---------+---------------+---------+-----------+----------+--------------+  POP      Full           Yes      Yes                                 +---------+---------------+---------+-----------+----------+--------------+ PTV      Full                                                        +---------+---------------+---------+-----------+----------+--------------+ PERO     Full                                                        +---------+---------------+---------+-----------+----------+--------------+     Summary: RIGHT: - There is no evidence of deep vein thrombosis in the lower extremity.  - No cystic structure found in the popliteal fossa.  LEFT: - There is no evidence of deep vein thrombosis in the lower extremity.  - No cystic structure found in the popliteal fossa.  *See table(s) above for measurements and observations. Electronically signed by Sherald Hess MD on 07/06/2022 at 1:44:50 PM.    Final    CT Angio Chest Pulmonary Embolism (PE) W or WO Contrast  Result Date: 07/06/2022 CLINICAL DATA:  Pulmonary embolism (PE) suspected, high prob EXAM: CT ANGIOGRAPHY CHEST WITH CONTRAST TECHNIQUE: Multidetector CT imaging of the chest was performed using the standard protocol during bolus administration of intravenous contrast. Multiplanar CT image reconstructions and MIPs were obtained to evaluate the vascular anatomy. RADIATION DOSE REDUCTION: This exam was performed according to the departmental dose-optimization program which includes  automated exposure control, adjustment of the mA and/or kV according to patient size and/or use of iterative reconstruction technique. CONTRAST:  75mL OMNIPAQUE IOHEXOL 350 MG/ML SOLN COMPARISON:  CT chest Jun 21, 2022 FINDINGS: Cardiovascular: Satisfactory opacification of the pulmonary arteries to the segmental level. No evidence of pulmonary embolism. The thoracic aorta is normal in caliber. Normal heart size. No pericardial effusion. Mediastinum/Nodes: Enteric tube courses below the GE junction with the distal tip not visualized. No enlarged mediastinal, hilar, or axillary lymph nodes. The thyroid gland appears normal. Lungs/Pleura: No pleural effusion. No pneumothorax. Subsegmental atelectasis of the left lower lobe dependent lung. No suspicious pulmonary nodules. Musculoskeletal: No aggressive osseous lesions. Status post median sternotomy. Upper abdomen: The visualized upper abdomen is unremarkable. Review of the MIP images confirms the above findings. IMPRESSION: 1. No findings of pulmonary embolism. 2. There is subsegmental atelectasis of the dependent left lower lobe. Otherwise, no acute findings in the chest. Electronically Signed   By: Olive Bass M.D.   On: 07/06/2022 09:26   Recent Labs    07/07/22 0540 07/08/22 0453  WBC 7.0 7.7  HGB 7.3* 7.0*  HCT 22.1* 21.4*  PLT 305 319   Recent Labs    07/07/22 1601 07/08/22 0453  NA 137 135  K 4.5 3.9  CL 107 101  CO2 20* 24  GLUCOSE 180* 185*  BUN 19 23  CREATININE 0.85 0.97  CALCIUM 8.3* 8.0*    Intake/Output Summary (Last 24 hours) at 07/08/2022 1610 Last data filed at 07/08/2022  0305 Gross per 24 hour  Intake 60 ml  Output 600 ml  Net -540 ml        Physical Exam: Vital Signs Blood pressure 113/61, pulse 99, temperature 97.6 F (36.4 C), temperature source Oral, resp. rate 18, height 5\' 5"  (1.651 m), weight 48 kg, SpO2 98 %.    Constitutional: No apparent distress.  Sitting up at bedside. HENT: No JVD. Neck Supple.  Trachea midline. Atraumatic, normocephalic. +Cortrak Eyes: PERRLA. EOMI.  Cardiovascular: Regular rate, regular rhythm, no murmurs/rub/gallops. No Edema. Peripheral pulses 2+  Respiratory: CTAB. No rales, rhonchi, or wheezing. On RA.  Abdomen: + bowel sounds, normoactive. No distention or tenderness.  GU: Not examined. + condom cath, draining clear urine.  Skin: Large ecchymotic area LLQ and multiple small bruises from heparin injection.   MSK:      No apparent deformity.      Strength: Ambulates with rolling walker at a contact-guard level to and from the toilet, with prolonged break to sit at rest from fatigue.                RUE: 4/5 throughout                LUE: 4/5 throughout                RLE: 3/5 throughout                LLE:  3/5 throughout   Neurologic exam:  Cognition: AAO to person, place, time= Memory: Recalls 3/3 objects at 5 minutes. Cannot add change. Cannot say months of the year in reverse.  Insight: Good insight into current condition.  Mood: Anxious mood.  Appropriate affect.   Sensation: To light touch intact in BL UEs and LEs  Reflexes: 1+ in BL UE and LEs. Negative Hoffman's and babinski signs bilaterally.  CN: + R ptosis. + mild R facial droop-unchanged Coordination: No apparent tremors. No ataxia on FTN, HTS bilaterally.   Spasticity: MAS 0 in all extremities.     Assessment/Plan: 1. Functional deficits which require 3+ hours per day of interdisciplinary therapy in a comprehensive inpatient rehab setting. Physiatrist is providing close team supervision and 24 hour management of active medical problems listed below. Physiatrist and rehab team continue to assess barriers to discharge/monitor patient progress toward functional and medical goals  Care Tool:  Bathing              Bathing assist       Upper Body Dressing/Undressing Upper body dressing        Upper body assist      Lower Body Dressing/Undressing Lower body dressing             Lower body assist       Toileting Toileting    Toileting assist       Transfers Chair/bed transfer  Transfers assist           Locomotion Ambulation   Ambulation assist              Walk 10 feet activity   Assist           Walk 50 feet activity   Assist           Walk 150 feet activity   Assist           Walk 10 feet on uneven surface  activity   Assist           Wheelchair  Assist               Wheelchair 50 feet with 2 turns activity    Assist            Wheelchair 150 feet activity     Assist          Blood pressure 113/61, pulse 99, temperature 97.6 F (36.4 C), temperature source Oral, resp. rate 18, height 5\' 5"  (1.651 m), weight 48 kg, SpO2 98 %.  Medical Problem List and Plan: 1. Functional deficits secondary to myasthenia gravis flare             -patient may shower             -ELOS/Goals: 18-21 days, Min A PT/OT/SLP             -Appreciate hospitalist discharge notation: Medications that may worsen or trigger MG exacerbation: Class IA antiarrhythmics, magnesium, flouroquinolones, macrolides, aminoglycosides, penicillamine, curare, interferon alpha, botox, quinine. Use with caution: calcium channel blockers, beta blockers and statins.    2.  Antithrombotics: -DVT/anticoagulation:  Pharmaceutical: Lovenox             -antiplatelet therapy: N/A   3. Pain Management: Tylenol prn.    4. Mood/Behavior/Sleep: Uses Ambien 10 mg as needed at home. LCSW to follow for evaluation and support. Team to provide ego support.              -antipsychotic agents: N/A             --Melatonin prn for insomnia.    5. Neuropsych/cognition: This patient is capable of making decisions on his own behalf. 6. Skin/Wound Care: Routine pressure relief measures 7. Fluids/Electrolytes/Nutrition: Monitor I/O. Change tube feed to 60  cc/hr X 10 hours w/H2O qid.  --start Calorie count tomorrow. Wife encouraged  to bring soft foods from home.               --downgrade diet to D2--reports feeling food sticking in throat/throat discomfort from intubations             --MMW added.    8. MG flare w/ respiratory failure: Has completed course of IVIG.  --Continue to monitor NIF/VC--> NIF -40 VC-1.2 L this am.              --Continue home regimen of Mestinon and Prednisone.    9. Anxiety: Continue Klonopin, Paxil and prn Xanax. Continue to provide ego support.  --Patient frustrated/expressing disappointment that he has not rebounded as in the past. Continue to encourage/provide support.  -- Would benefit from neuropsych evaluation --6-8: Encouraged family regarding patient's low energy, attributable to current MG flare in addition to medications required for mood control.  Will add BuSpar 5 mg twice daily, wean Klonopin to 0.5 mg twice daily if tolerated.   10. T2DM: Hgb A1c--5.7 and well controlled. Will monitor BS ac/hs and use SSI for elevated BS             --continue to hold Metformin and Januvia (discussed need to increase intake w/patient)  -- 6-8: Elevated, eating 25 to 50% of meals.  Continue monitoring, consider addition of Semglee or resuming metformin when appropriate.  Recent Labs    07/08/22 0408 07/08/22 0839 07/08/22 1145  GLUCAP 222* 163* 267*       11. Hep B: On Descovy as substitute-->wife to bring Entecavir from home.    12. Hypotension/Tachycardia: Ongoing for weeks. Inpatient workup including CTA chest and lower extremity venous duplex negative  -  Monitor for any signs of decline in his NIF or vital capacity, if any worsening discontinue Cardizem.   LOS: 1 days A FACE TO FACE EVALUATION WAS PERFORMED  Angelina Sheriff 07/08/2022, 8:39 AM

## 2022-07-08 NOTE — Plan of Care (Signed)
  Problem: RH Balance Goal: LTG Patient will maintain dynamic standing with ADLs (OT) Description: LTG:  Patient will maintain dynamic standing balance with assist during activities of daily living (OT)  Flowsheets (Taken 07/08/2022 1220) LTG: Pt will maintain dynamic standing balance during ADLs with: Independent with assistive device   Problem: RH Bathing Goal: LTG Patient will bathe all body parts with assist levels (OT) Description: LTG: Patient will bathe all body parts with assist levels (OT) Flowsheets (Taken 07/08/2022 1220) LTG: Pt will perform bathing with assistance level/cueing: Independent with assistive device    Problem: RH Dressing Goal: LTG Patient will perform lower body dressing w/assist (OT) Description: LTG: Patient will perform lower body dressing with assist, with/without cues in positioning using equipment (OT) Flowsheets (Taken 07/08/2022 1220) LTG: Pt will perform lower body dressing with assistance level of: Independent with assistive device   Problem: RH Toileting Goal: LTG Patient will perform toileting task (3/3 steps) with assistance level (OT) Description: LTG: Patient will perform toileting task (3/3 steps) with assistance level (OT)  Flowsheets (Taken 07/08/2022 1220) LTG: Pt will perform toileting task (3/3 steps) with assistance level: Independent with assistive device   Problem: RH Toilet Transfers Goal: LTG Patient will perform toilet transfers w/assist (OT) Description: LTG: Patient will perform toilet transfers with assist, with/without cues using equipment (OT) Flowsheets (Taken 07/08/2022 1220) LTG: Pt will perform toilet transfers with assistance level of: Independent with assistive device   Problem: RH Tub/Shower Transfers Goal: LTG Patient will perform tub/shower transfers w/assist (OT) Description: LTG: Patient will perform tub/shower transfers with assist, with/without cues using equipment (OT) Flowsheets (Taken 07/08/2022 1220) LTG: Pt will  perform tub/shower stall transfers with assistance level of: Independent with assistive device

## 2022-07-08 NOTE — Plan of Care (Signed)
  Problem: RH Swallowing Goal: LTG Patient will consume least restrictive diet using compensatory strategies with assistance (SLP) Description: LTG:  Patient will consume least restrictive diet using compensatory strategies with assistance (SLP) Flowsheets (Taken 07/08/2022 1528) LTG: Pt Patient will consume least restrictive diet using compensatory strategies with assistance of (SLP): Supervision Goal: LTG Patient will participate in dysphagia therapy to increase swallow function with assistance (SLP) Description: LTG:  Patient will participate in dysphagia therapy to increase swallow function with assistance (SLP) Flowsheets (Taken 07/08/2022 1528) LTG: Pt will participate in dysphagia therapy to increase swallow function with assistance of (SLP): Supervision Goal: LTG Pt will demonstrate functional change in swallow as evidenced by bedside/clinical objective assessment (SLP) Description: LTG: Patient will demonstrate functional change in swallow as evidenced by bedside/clinical objective assessment (SLP) Flowsheets (Taken 07/08/2022 1528) LTG: Patient will demonstrate functional change in swallow as evidenced by bedside/clinical objective assessment: Oral swallow   Problem: RH Expression Communication Goal: LTG Patient will increase speech intelligibility (SLP) Description: LTG: Patient will increase speech intelligibility at word/phrase/conversation level with cues, % of the time (SLP) Flowsheets (Taken 07/08/2022 1528) LTG: Patient will increase speech intelligibility (SLP): Supervision   Problem: RH Problem Solving Goal: LTG Patient will demonstrate problem solving for (SLP) Description: LTG:  Patient will demonstrate problem solving for basic/complex daily situations with cues  (SLP) Flowsheets (Taken 07/08/2022 1528) LTG: Patient will demonstrate problem solving for (SLP): Basic daily situations LTG Patient will demonstrate problem solving for: Supervision   Problem: RH Attention Goal: LTG  Patient will demonstrate this level of attention during functional activites (SLP) Description: LTG:  Patient will will demonstrate this level of attention during functional activites (SLP) Flowsheets (Taken 07/08/2022 1528) Patient will demonstrate during cognitive/linguistic activities the attention type of: Selective LTG: Patient will demonstrate this level of attention during cognitive/linguistic activities with assistance of (SLP): Supervision

## 2022-07-08 NOTE — Progress Notes (Signed)
Patient performed NIF and VC with good effort.  NIF -40 VC 1.0L.

## 2022-07-08 NOTE — Evaluation (Signed)
Speech Language Pathology Assessment and Plan  Patient Details  Name: [name] MRN: [id number] Date of Birth: 11/01/1955  SLP Diagnosis: Cognitive Impairments;Dysarthria;Dysphagia  Rehab Potential: Good ELOS: 7-10 days    Today's Date: 07/08/2022 SLP Individual Time: 1000-1045 SLP Individual Time Calculation (min): 45 min and Today's Date: 07/08/2022 SLP Missed Time: 15 Minutes Missed Time Reason: Patient fatigue   Hospital Problem: Principal Problem:   Myasthenia exacerbation (HCC)  Past Medical History:  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)    Spongiotic dermatitis    Type II or unspecified type diabetes mellitus without mention of complication, not stated as uncontrolled    Viral hepatitis B without mention of hepatic coma, chronic, without mention of hepatitis delta    Past Surgical History:  Past Surgical History:  Procedure Laterality Date   THYMECTOMY      Assessment / Plan / Recommendation Clinical Impression Patient is a 67 year old [name] male with history of  T2DM, hepatitis B, HTN, anxiety d/o,  malignant thymoma s/p resection, MG with recent flare with RUE weakness, difficulty swallowing, SOB, and head droop treated with IVIG X  3 doses with improvement in NIF from -20, VC 2.9L to NIF -30, VC 3.7L and d/c to home on 06/19/22.  He was readmitted on 06/21/22 with difficulty swallowing, SOB and difficulty walking.  He was intubated in ED, treated with IV Mg with no plans initially to repeat IVIG or PLEX. CTA chest 05/22 was negative for PE or acute findings and showed 40 mm dilatation of ascending aorta (yearly imaging recommended). He was extubated on 05/24 but developed  tachycardia with elevated BP,  started on Cardizem drip (BB contraindicated in setting of MG), failed trial of BIPAP with mild hypercarbia requiring reintubation.  He tolerated extubation on 06/26/22 but later that evening developed SVT with HR in 150-160, SBP 240's and difficulty coughing up excessive secretions. Chest PT added, started on nicardipine drip and required re-intubation. He was extubated to  4 L oxygen by 05/30 and weaned off. He continued to have issues with tachycardia and copious secretion but had high levels of anxiety with chest PT as well as issues with confusion. MBS showed mild to moderate oropharyngeal dysphagia with moderate residue (worse with nectar), one trace instance of frank penetration but ability to protect airway. He was started on D3, thins but intake is poor due to reports of sore throat.  Therapy evaluations completed with recommendations for CIR. Patient admitted 07/07/22.    Upon arrival, patient was sitting upright in the wheelchair and attempting to eat his breakfast of Dys. 2 textures with thin liquids. Patient extremely fatigued from previous OT evaluation and requesting to return to bed. Family also requesting to allow patient to rest if able. Patient agreeable to a few bites off his tray. Patient consumed thin liquids via cup and straw without mild anterior spillage noted but o overt s/s of aspiration. Patient with prolonged mastication of solid textures with eventual expectoration and reports of everything "moving slow." Patient consumed pureed textures without difficulty. Spent extensive time talking to the patient and his family regarding patient's lethargy and poor endurance. Recommend downgrade to Dys. 1 textures at  this time to maximize ease of oral transit in hopes of improving patient's overall PO intake. Also hopeful patient's solids can be upgraded quickly as his overall endurance improves. Patient and family verbalized understanding. During informal discussion,  patient was ~50% intelligible at the phrase level due to a low vocal intensity and imprecise consonants. Patient transferred back to bed with Min A but required verbal cues for sequencing with task. Due to fatigue, a formal cognitive evaluation was not completed but deficits in arousal, attention, and safety observed.  Of note, nursing made aware of patient's extreme lethargy. BP was taken and WNL. Patient transferred back to bed and missed remaining 15 minutes of session.  Patient would benefit from skilled SLP intervention to maximize his swallowing and cognitive functioning and speech intelligibility prior to discharge.    Skilled Therapeutic Interventions          Administered a BSE and cognitive-linguistic evaluation, please see above for details.   SLP Assessment  Patient will need skilled Speech Lanaguage Pathology Services during CIR admission    Recommendations  SLP Diet Recommendations: Dysphagia 1 (Puree);Thin Liquid Administration via: Cup;Straw Medication Administration: Whole meds with puree (or crushed if lethargic) Supervision: Patient able to self feed;Full supervision/cueing for compensatory strategies Compensations: Minimize environmental distractions;Slow rate;Small sips/bites Postural Changes and/or Swallow Maneuvers: Seated upright 90 degrees Oral Care Recommendations: Oral care BID Recommendations for Other Services: Neuropsych consult Patient destination: Home Follow up Recommendations: 24 hour supervision/assistance;Home Health SLP;Outpatient SLP Equipment Recommended: None recommended by SLP    SLP Frequency 1 to 3 out of 7 days   SLP Duration  SLP Intensity  SLP Treatment/Interventions 7-10 days  Minumum of 1-2 x/day, 30 to 90 minutes  Cognitive remediation/compensation;Cueing hierarchy;Dysphagia/aspiration precaution training;Environmental controls;Functional tasks;Patient/family education;Therapeutic Activities    Pain Pain Assessment Pain Scale:  0-10 Pain Score: 0-No pain  Prior Functioning Type of Home: House  Lives With: Spouse;Family Available Help at Discharge: Family;Available 24 hours/day  SLP Evaluation Cognition Overall Cognitive Status: History of cognitive impairments - at baseline Arousal/Alertness: Lethargic Orientation Level: Oriented X4 Memory: Impaired Memory Impairment: Decreased recall of new information Awareness: Impaired Awareness Impairment: Emergent impairment Problem Solving: Impaired Problem Solving Impairment: Functional basic Safety/Judgment: Impaired  Comprehension Auditory Comprehension Overall Auditory Comprehension: Appears within functional limits for tasks assessed Expression Expression Primary Mode of Expression: Verbal Verbal Expression Overall Verbal Expression: Impaired Initiation: Impaired Repetition: No impairment Naming: No impairment Pragmatics: Impairment Impairments: Abnormal affect;Eye contact;Turn Taking Interfering Components: Attention Written Expression Dominant Hand: Right Written Expression: Not tested Oral Motor Oral Motor/Sensory Function Overall Oral Motor/Sensory Function: Generalized oral weakness Motor Speech Overall Motor Speech: Impaired Respiration: Impaired Level of Impairment: Word Resonance: Within functional limits Articulation: Within functional limitis Intelligibility: Intelligibility reduced Word: 75-100% accurate Phrase: 50-74% accurate Effective Techniques: Increased vocal intensity  Care Tool Care Tool Cognition Ability to hear (with hearing aid or hearing appliances if normally used Ability to hear (with hearing aid or hearing appliances if normally used): 0. Adequate - no difficulty in normal conservation, social interaction, listening to TV   Expression of Ideas and Wants Expression of Ideas and Wants: 2. Frequent difficulty - frequently exhibits difficulty with expressing needs and ideas   Understanding Verbal and Non-Verbal  Content Understanding Verbal and Non-Verbal Content: 3. Usually understands - understands most conversations, but misses some part/intent of message. Requires cues at times to understand  Memory/Recall Ability Memory/Recall Ability : Current season;That he or she is in a hospital/hospital unit   Bedside Swallowing Assessment General  Previous Swallow Assessment: MBS 6/4 Diet Prior to this Study: Dysphagia 2 (finely chopped);Thin liquids (Level 0) Temperature Spikes Noted: No Respiratory Status: Room air History of Recent Intubation: Yes Total duration of intubation (days): 9 days Date extubated: 06/29/22 Behavior/Cognition: Lethargic/Drowsy;Requires cueing Oral Cavity - Dentition: Adequate natural dentition Self-Feeding Abilities: Able to feed self Patient Positioning: Upright in chair/Tumbleform Baseline Vocal Quality: Low vocal intensity Volitional Cough: Weak Volitional Swallow: Able to elicit  Oral Care Assessment   Ice Chips Ice chips: Not tested Thin Liquid Thin Liquid: Impaired Presentation: Cup;Self Fed;Straw Oral Phase Impairments: Reduced labial seal Nectar Thick Nectar Thick Liquid: Not tested Honey Thick Honey Thick Liquid: Not tested Puree Puree: Within functional limits Presentation: Self Fed;Spoon Solid Solid: Impaired Oral Phase Impairments: Impaired mastication Oral Phase Functional Implications: Prolonged oral transit;Impaired mastication BSE Assessment Risk for Aspiration Impact on safety and function: Moderate aspiration risk;Risk for inadequate nutrition/hydration Other Related Risk Factors: Deconditioning;Lethargy  Short Term Goals: Week 1: SLP Short Term Goal 1 (Week 1): STGs=LTGs due to ELOS  Refer to Care Plan for Long Term Goals  Recommendations for other services: Neuropsych  Discharge Criteria: Patient will be discharged from SLP if patient refuses treatment 3 consecutive times without medical reason, if treatment goals not met, if there is  a change in medical status, if patient makes no progress towards goals or if patient is discharged from hospital.  The above assessment, treatment plan, treatment alternatives and goals were discussed and mutually agreed upon: by patient and by family  Paysley Poplar 07/08/2022, 3:02 PM

## 2022-07-08 NOTE — Evaluation (Signed)
Physical Therapy Assessment and Plan  Patient Details  Name: [name] MRN: [id number] Date of Birth: [birth year]  PT Diagnosis: Difficulty walking and Muscle weakness Rehab Potential: Good ELOS: 7-10 days   Today's Date: 07/08/2022 PT Individual Time: 1300-1415 PT Individual Time Calculation (min): 75 min    Hospital Problem: Principal Problem:   Myasthenia exacerbation (HCC)   Past Medical History:  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)    Spongiotic dermatitis    Type II or unspecified type diabetes mellitus without mention of complication, not stated as uncontrolled    Viral hepatitis B without mention of hepatic coma, chronic, without mention of hepatitis delta    Past Surgical History:  Past Surgical History:  Procedure Laterality Date   THYMECTOMY      Assessment & Plan Clinical Impression: Patient is a 67 year old [name] male with history of  T2DM, hepatitis B, HTN, anxiety d/o,  malignant thymoma s/p resection, MG with recent flare with RUE weakness, difficulty swallowing, SOB, and head droop treated with IVIG X  3 doses with improvement in NIF from -20, VC 2.9L to NIF -30, VC 3.7L and d/c to home on 06/19/22.  He was readmitted on 06/21/22 with difficulty swallowing, SOB and difficulty walking due to DOE. RR 34 at admission with NIF -19. He was intubated in ED, treated with IV Mg with no plans initially to repeat IVIG or PLEX. CTA chest 05/22 was negative for PE or acute findings and showed 40 mm dilatation of ascending aorta (yearly imaging recommended). He was extubated on 05/24 but developed tachycardia with elevated BP,  started on Cardizem drip (BB contraindicated in setting  of MG), failed trial of BIPAP with mild hypercarbia requiring reintubation. 2D echo done revealing EF 60-65% with mild concentric LVH and He was started on IVIG X 5 days . Hospital course significant for fever T-101 on 05/25 and BC X 2 negative.  He tolerated extubation on 06/26/22 but later that evening developed SVT with HR in 150-160, SBP 240's and difficulty coughing up excessive secretions. Chest PT added, started on nicardipine drip and required re-intubation. Drop in BP treated with Neo and Klonopin added to help manage anxiety. neo reintubated.    He developed RUE weakness on 05/28 and MRI brain performed which was negative for acute or subacute infarct. Levophed d/c on 05/29 due to RUE edema from infiltration of IV which was treated with local measures. He was extubated to  4 L oxygen by 05/30 and weaned off. He continued to have issues with tachycardia and copious secretion but had high levels of anxiety with chest PT as well as issues with confusion. MBS showed mild to moderate oropharyngeal dysphagia with moderate residue (worse with nectar), one trace instance of frank penetration but ability to protect airway. He was started on D3, thins but intake is poor due to reports of sore throat as well as feeling of being choked.  He required IV Cardizem for tachycardia on 06/01 and reports panicked feeling with this causing SOB-->klonopin increased to 0.5 mg bid.     Due to concerns of exacerbating MG, Cardizem was discontinued on 06/04 due to concerns of exacerbating MG  but patient developed tachypnea with tachycardia and HR in 120's on 06/05 with question of anxiety v/s PE.  He was treated with IVF, Klonopin titrated to 1 mg and Paxil added in addition to prn use of ativan. CXR negative and CTA chest negative for PE.BLE dopplers negative for DVTMentation has improved but intake remains poor with tube feed ongoing at nights. NIF -40, VC-1.2 L this am. Therapy has been working with patient who continues to  be limited by weakness with balance deficits, poor safety awareness and gait disorder. CIR recommended due to functional decline.    Patient transferred to CIR on 07/07/2022 .   Patient currently requires min with mobility secondary to muscle weakness.  Prior to hospitalization, patient was independent  with mobility and lived with Spouse, Family in a House home.  Home access is 1Stairs to enter.  Patient will benefit from skilled PT intervention to maximize safe functional mobility, minimize fall risk, and decrease caregiver burden for planned discharge home with 24 hour supervision.  Anticipate patient will benefit from follow up OP at discharge.  PT - End of Session Activity Tolerance: Tolerates 30+ min activity with multiple rests Endurance Deficit: Yes Endurance Deficit Description: limited activity tolerance PT Assessment Rehab Potential (ACUTE/IP ONLY): Good PT Barriers to Discharge: Nutrition means PT Patient demonstrates impairments in the following area(s): Balance;Endurance;Motor;Nutrition;Pain;Safety PT Transfers Functional Problem(s): Bed Mobility;Bed to Chair;Car PT Locomotion Functional Problem(s): Ambulation;Stairs PT Plan PT Intensity: Minimum of 1-2 x/day ,45 to 90 minutes PT Frequency: 5 out of 7 days PT Duration Estimated Length of Stay: 7-10 days PT Treatment/Interventions: Ambulation/gait training;Balance/vestibular training;Community reintegration;Discharge planning;Disease management/prevention;DME/adaptive equipment instruction;Functional mobility training;Pain management;Patient/family education;Stair training;Therapeutic Activities;Therapeutic Exercise;UE/LE Strength taining/ROM;UE/LE Coordination activities PT Transfers Anticipated Outcome(s): modI PT Locomotion Anticipated Outcome(s): modI PT Recommendation Follow Up Recommendations: Outpatient PT Patient destination: Home Equipment Recommended: To be determined   PT  Evaluation Precautions/Restrictions Precautions Precautions: Fall;Other (comment) Precaution Comments: Watch BP and RR; Aspiration Restrictions Weight Bearing Restrictions: No General Chart Reviewed: Yes Family/Caregiver Present: No Vital SignsTherapy Vitals BP: 103/65 Patient Position (if appropriate): Sitting Pain Pain Assessment Pain Scale: 0-10 Pain Score: 0-No pain Pain Interference Pain Interference Pain Effect on Sleep: 0. Does not apply - I have not had any pain or hurting in the past 5 days Pain Interference with Therapy Activities: 0. Does not apply - I have not received rehabilitationtherapy in the past 5 days Pain Interference with Day-to-Day Activities: 1. Rarely or not at all Home Living/Prior Functioning Home Living Available Help at Discharge: Family;Available 24 hours/day Type of Home: House Home Access: Stairs to enter [employer] of Steps: 1 Entrance Stairs-Rails: None Home Layout: One level Bathroom Shower/Tub: Engineer, manufacturing systems: Standard Bathroom Accessibility: Yes  Lives With: Spouse;Family Vision/Perception  Vision - History Ability to See in Adequate Light: 0 Adequate  Cognition Overall Cognitive Status: Within Functional Limits for tasks assessed Arousal/Alertness: Awake/alert Orientation Level: Oriented X4 Sensation Sensation Light Touch: Appears Intact Hot/Cold: Appears Intact Proprioception: Appears Intact Motor  Motor Motor - Skilled Clinical Observations: Increased R-sided weakness due to MG diagnosis   Trunk/Postural Assessment  Cervical Assessment Cervical Assessment: Within Functional Limits Thoracic Assessment Thoracic Assessment: Within Functional Limits Lumbar Assessment Lumbar Assessment: Within Functional Limits Postural Control Postural Control: Deficits on evaluation  Balance Balance Balance Assessed: Yes Static Sitting Balance Static Sitting - Balance Support: Feet supported;Feet  unsupported Static Sitting - Level of Assistance: 5: Stand by assistance Dynamic Sitting Balance Dynamic Sitting - Balance Support: During functional activity Dynamic  Sitting - Level of Assistance: 5: Stand by assistance Dynamic Sitting - Balance Activities: Lateral lean/weight shifting;Forward lean/weight shifting;Reaching for objects Extremity Assessment      RLE Assessment RLE Assessment: Exceptions to [facility] General Strength Comments: gross 4/5 LLE Assessment LLE Assessment: Within Functional Limits  Care Tool Care Tool Bed Mobility Roll left and right activity   Roll left and right assist level: Minimal Assistance - Patient > 75%    Sit to lying activity   Sit to lying assist level: Minimal Assistance - Patient > 75%    Lying to sitting on side of bed activity   Lying to sitting on side of bed assist level: the ability to move from lying on the back to sitting on the side of the bed with no back support.: Minimal Assistance - Patient > 75%     Care Tool Transfers Sit to stand transfer   Sit to stand assist level: Minimal Assistance - Patient > 75%    Chair/bed transfer   Chair/bed transfer assist level: Minimal Assistance - Patient > 75%     Toilet transfer   Assist Level: Minimal Assistance - Patient > 75%    Car transfer   Car transfer assist level: Minimal Assistance - Patient > 75%      Care Tool Locomotion Ambulation   Assist level: Minimal Assistance - Patient > 75% Assistive device: Walker-rolling Max distance: 50  Walk 10 feet activity   Assist level: Minimal Assistance - Patient > 75% Assistive device: Walker-rolling   Walk 50 feet with 2 turns activity   Assist level: Minimal Assistance - Patient > 75% Assistive device: Walker-rolling  Walk 150 feet activity Walk 150 feet activity did not occur: Safety/medical concerns (2/2 fatigue)      Walk 10 feet on uneven surfaces activity   Assist level: Minimal Assistance - Patient > 75% Assistive device:  Walker-rolling  Stairs   Assist level: Minimal Assistance - Patient > 75% Stairs assistive device: 2 hand rails Max number of stairs: 4  Walk up/down 1 step activity   Walk up/down 1 step (curb) assist level: Minimal Assistance - Patient > 75% Walk up/down 1 step or curb assistive device: 2 hand rails  Walk up/down 4 steps activity   Walk up/down 4 steps assist level: Minimal Assistance - Patient > 75% Walk up/down 4 steps assistive device: 2 hand rails  Walk up/down 12 steps activity Walk up/down 12 steps activity did not occur: Safety/medical concerns (2/2 fatigue)      Pick up small objects from floor   Pick up small object from the floor assist level: Minimal Assistance - Patient > 75% Pick up small object from the floor assistive device: RW  Wheelchair Is the patient using a wheelchair?: No   Wheelchair activity did not occur: N/A      Wheel 50 feet with 2 turns activity Wheelchair 50 feet with 2 turns activity did not occur: N/A    Wheel 150 feet activity Wheelchair 150 feet activity did not occur: N/A      Refer to Care Plan for Long Term Goals  SHORT TERM GOAL WEEK 1 PT Short Term Goal 1 (Week 1): STG=LTG 2/2 LOS  Recommendations for other services: None   Skilled Therapeutic Intervention Mobility Transfers Transfers: Sit to Stand;Stand to Sit;Transfer Sit to Stand: Minimal Assistance - Patient > 75% Stand to Sit: Minimal Assistance - Patient > 75% Transfer (Assistive device): Rolling walker Locomotion  Gait Ambulation: Yes Gait Assistance: Minimal Assistance - Patient >  75% Gait Distance (Feet): 50 Feet Assistive device: Rolling walker Gait Gait: Yes Gait Pattern: Decreased stride length Gait velocity: decr Stairs / Additional Locomotion Stairs: Yes Stairs Assistance: Minimal Assistance - Patient > 75% Stair Management Technique: Two rails Number of Stairs: 4 Height of Stairs: 4 Ramp: Minimal Assistance - Patient >75% Curb: Minimal Assistance -  Patient >75% Wheelchair Mobility Wheelchair Mobility: No  Session Note: Chart reviewed and pt agreeable to therapy. Pt received seated in WC with no c/o pain. Session focused on evaluation, functional transfers, and locomtion to promote safe home access. Pt initiated session with evaluation as described above. Pt BP also assessed for OH, BP in sitting = 103/30mmHg and BP in standing = 93/22mmHg not indicating OH. Pt initiated session with evaluation as described above. Pt then completed amb of 45ft using CGA + RW with VC. Pt then completed ramp navigation with continued MinA + RW. In room, pt amb to toilet with CGA + Rw and required MinA for peri-care. Pt returned to bed with CGA + RW. Session education emphasized role of PT, therapy goals, CIR policies, and early d/c planning. At end of session, pt was left semi-reclined in bed with alarm engaged, nurse call bell and all needs in reach.    Discharge Criteria: Patient will be discharged from PT if patient refuses treatment 3 consecutive times without medical reason, if treatment goals not met, if there is a change in medical status, if patient makes no progress towards goals or if patient is discharged from hospital.  The above assessment, treatment plan, treatment alternatives and goals were discussed and mutually agreed upon: by patient  Dionne Milo, PT, DPT 07/08/2022, 2:30 PM

## 2022-07-08 NOTE — Evaluation (Signed)
Occupational Therapy Assessment and Plan  Patient Details  Name: [name] MRN: [id number] Date of Birth: 11-20-1955  OT Diagnosis: muscle weakness (generalized) and decreased activity tolerance Rehab Potential: Rehab Potential (ACUTE ONLY): Good ELOS: 7-10 days   Today's Date: 07/08/2022 OT Individual Time: 4098-1191 OT Individual Time Calculation (min): 71 min     Hospital Problem: Principal Problem:   Myasthenia exacerbation (HCC)   Past Medical History:  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)    Spongiotic dermatitis    Type II or unspecified type diabetes mellitus without mention of complication, not stated as uncontrolled    Viral hepatitis B without mention of hepatic coma, chronic, without mention of hepatitis delta    Past Surgical History:  Past Surgical History:  Procedure Laterality Date   THYMECTOMY      Assessment & Plan Clinical Impression: Patient is a 67 year old [name] male with history of  T2DM, hepatitis B, HTN, anxiety d/o,  malignant thymoma s/p resection, MG with recent flare with RUE weakness, difficulty swallowing, SOB, and head droop treated with IVIG X  3 doses with improvement in NIF from -20, VC 2.9L to NIF -30, VC 3.7L and d/c to home on 06/19/22.  He was readmitted on 06/21/22 with difficulty swallowing, SOB and difficulty walking due to DOE. RR 34 at admission with NIF -19. He was intubated in ED, treated with IV Mg with no plans initially to repeat IVIG or PLEX. CTA chest 05/22 was negative for PE or acute findings and showed 40 mm dilatation of ascending aorta (yearly imaging recommended). He was extubated on 05/24 but developed tachycardia with elevated  BP,  started on Cardizem drip (BB contraindicated in setting of MG), failed trial of BIPAP with mild hypercarbia requiring reintubation. 2D echo done revealing EF 60-65% with mild concentric LVH and He was started on IVIG X 5 days . Hospital course significant for fever T-101 on 05/25 and BC X 2 negative.  He tolerated extubation on 06/26/22 but later that evening developed SVT with HR in 150-160, SBP 240's and difficulty coughing up excessive secretions. Chest PT added, started on nicardipine drip and required re-intubation. Drop in BP treated with Neo and Klonopin added to help manage anxiety. neo reintubated.    He developed RUE weakness on 05/28 and MRI brain performed which was negative for acute or subacute infarct. Levophed d/c on 05/29 due to RUE edema from infiltration of IV which was treated with local measures. He was extubated to  4 L oxygen by 05/30 and weaned off. He continued to have issues with tachycardia and copious secretion but had high levels of anxiety with chest PT as well as issues with confusion. MBS showed mild to moderate oropharyngeal dysphagia with moderate residue (worse with nectar), one trace instance of frank penetration but ability to protect airway. He was started on D3, thins but intake is poor due to reports of sore throat as well as feeling of being choked.  He required IV Cardizem for tachycardia on 06/01 and reports panicked feeling with this causing SOB-->klonopin increased to 0.5 mg bid.     Due to concerns of exacerbating MG, Cardizem was discontinued on  06/04 due to concerns of exacerbating MG but patient developed tachypnea with tachycardia and HR in 120's on 06/05 with question of anxiety v/s PE.  He was treated with IVF, Klonopin titrated to 1 mg and Paxil added in addition to prn use of ativan. CXR negative and CTA chest negative for PE.BLE dopplers negative for DVTMentation has improved but intake remains poor with tube feed ongoing at nights. NIF -40, VC-1.2 L  this am. Patient transferred to CIR on 07/07/2022 .    Patient currently requires  CGA-Min A  with basic self-care skills secondary to muscle weakness, decreased cardiorespiratoy endurance, and decreased sitting balance, decreased standing balance, and decreased balance strategies.  Prior to hospitalization, patient could complete BADLs independently.   Patient will benefit from skilled intervention to increase independence with basic self-care skills prior to discharge home with care partner.  Anticipate patient will require 24 hour supervision and follow up outpatient.  OT - End of Session Activity Tolerance: Tolerates 10 - 20 min activity with multiple rests Endurance Deficit: Yes OT Assessment Rehab Potential (ACUTE ONLY): Good OT Barriers to Discharge: Nutrition means OT Patient demonstrates impairments in the following area(s): Balance;Endurance;Nutrition;Safety OT Basic ADL's Functional Problem(s): Bathing;Dressing;Toileting OT Transfers Functional Problem(s): Toilet;Tub/Shower OT Plan OT Intensity: Minimum of 1-2 x/day, 45 to 90 minutes OT Frequency: 5 out of 7 days OT Duration/Estimated Length of Stay: 7-10 days OT Treatment/Interventions: Balance/vestibular training;Community reintegration;Discharge planning;Disease mangement/prevention;DME/adaptive equipment instruction;Functional mobility training;Patient/family education;Self Care/advanced ADL retraining;Therapeutic Activities;Therapeutic Exercise;UE/LE Strength taining/ROM OT Basic Self-Care Anticipated Outcome(s): Mod I OT Toileting Anticipated Outcome(s): Mod I OT Bathroom Transfers Anticipated Outcome(s): Mod I OT Recommendation Patient destination: Home Follow Up Recommendations: Outpatient OT Equipment Recommended: To be determined   OT Evaluation Precautions/Restrictions  Precautions Precautions: Fall;Other (comment) Precaution Comments: Watch BP and RR; Aspiration General Chart Reviewed: Yes Family/Caregiver  Present: Yes (Spouse) Vital Signs Therapy Vitals Pulse Rate: 94 BP: 102/66 Oxygen Therapy SpO2: 100 % O2 Device: Room Air Pain Pain Assessment Pain Scale: 0-10 Pain Score: 0-No pain Home Living/Prior Functioning Home Living Available Help at Discharge: Family, Available 24 hours/day Type of Home: House Home Access: Stairs to enter Secretary/administrator of Steps: 1 Entrance Stairs-Rails: None Home Layout: One level Bathroom Shower/Tub: Tub/shower unit (Has long-handled shower head.) Firefighter: Pharmacist, community: Yes  Lives With: Spouse, Family IADL History Homemaking Responsibilities: No Current License: Yes Occupation: Retired Leisure and Hobbies: Gardening Prior Function Level of Independence: Independent with basic ADLs, Independent with homemaking with ambulation, Independent with gait Vision Baseline Vision/History: 1 Wears glasses Ability to See in Adequate Light: 0 Adequate Patient Visual Report: No change from baseline Additional Comments: R eye ptosis Perception  Perception: Within Functional Limits Praxis Praxis: Intact Cognition Cognition Overall Cognitive Status: Within Functional Limits for tasks assessed Arousal/Alertness: Awake/alert Orientation Level: Place;Person;Situation Memory: Impaired Memory Impairment: Decreased recall of new information Awareness: Impaired Problem Solving: Appears intact Safety/Judgment: Impaired Comments: Wife reports patients attempting to stand without staff present. Brief Interview for Mental Status (BIMS) Repetition of Three Words (First Attempt): 3 Temporal Orientation: Year: Correct Temporal Orientation: Month: Accurate within 5 days Temporal Orientation: Day: Correct Recall: "Sock": No, could not recall Recall: "Blue": No, could not recall Recall: "Bed": No, could not recall BIMS Summary Score: 9 Sensation Sensation Light Touch: Appears Intact Hot/Cold: Appears Intact Proprioception:  Appears Intact Coordination Gross Motor Movements are Fluid and Coordinated: No Fine Motor Movements are Fluid and Coordinated: No Coordination and Movement Description: Decreased due to generalized weakness and fatigue. Motor  Motor Motor - Skilled Clinical Observations: Increased R-sided weakness due to MG diagnosis  Trunk/Postural Assessment  Cervical Assessment Cervical Assessment: Within Functional Limits Thoracic Assessment Thoracic Assessment: Within Functional Limits Lumbar Assessment Lumbar Assessment: Within Functional Limits Postural Control Postural Control: Deficits on evaluation  Balance Balance Balance Assessed: Yes Static Sitting Balance Static Sitting - Balance Support: Feet supported;Feet unsupported Static Sitting - Level of Assistance: 5: Stand by assistance Dynamic Sitting Balance Dynamic Sitting - Balance Support: During functional activity Dynamic Sitting - Level of Assistance: 5: Stand by assistance Dynamic Sitting - Balance Activities: Lateral lean/weight shifting;Forward lean/weight shifting;Reaching for Higher education careers adviser Standing - Balance Support: Bilateral upper extremity supported Static Standing - Level of Assistance: 5: Stand by assistance (CGA) Dynamic Standing Balance Dynamic Standing - Balance Support: During functional activity Dynamic Standing - Level of Assistance: 4: Min assist (CGA) Dynamic Standing - Balance Activities: Lateral lean/weight shifting Extremity/Trunk Assessment RUE Assessment RUE Assessment: Exceptions to [facility] General Strength Comments: 3-/5 LUE Assessment LUE Assessment: Exceptions to [facility] General Strength Comments: 3+/5  Care Tool Care Tool Self Care Eating   Eating Assist Level: Set up assist    Oral Care    Oral Care Assist Level: Set up assist    Bathing   Body parts bathed by patient: Right arm;Left arm;Chest;Abdomen;Front perineal area;Buttocks;Right upper leg;Left upper leg;Right lower  leg;Left lower leg;Face     Assist Level: Contact Guard/Touching assist    Upper Body Dressing(including orthotics)   What is the patient wearing?: Pull over shirt   Assist Level: Set up assist    Lower Body Dressing (excluding footwear)   What is the patient wearing?: Pants Assist for lower body dressing: Minimal Assistance - Patient > 75%    Putting on/Taking off footwear   What is the patient wearing?: Non-skid slipper socks Assist for footwear: Contact Guard/Touching assist       Care Tool Toileting Toileting activity Toileting Activity did not occur (Clothing management and hygiene only): N/A (no void or bm)       Care Tool Bed Mobility Roll left and right activity    Defer to PT Eval     Sit to lying activity    Defer to PT Eval     Lying to sitting on side of bed activity    Defer to PT Eval      Care Tool Transfers Sit to stand transfer   Sit to stand assist level: Minimal Assistance - Patient > 75%    Chair/bed transfer   Chair/bed transfer assist level: Minimal Assistance - Patient > 75%     Toilet transfer   Assist Level: Minimal Assistance - Patient > 75%     Care Tool Cognition  Expression of Ideas and Wants Expression of Ideas and Wants: 3. Some difficulty - exhibits some difficulty with expressing needs and ideas (e.g, some words or finishing thoughts) or speech is not clear  Understanding Verbal and Non-Verbal Content Understanding Verbal and Non-Verbal Content: 4. Understands (complex and basic) - clear comprehension without cues or repetitions   Memory/Recall Ability Memory/Recall Ability : Current season;That he or she is in a hospital/hospital unit   Refer to Care Plan for Long Term Goals  SHORT TERM GOAL WEEK 1 OT Short Term Goal 1 (Week 1): STGs=LTGS due to patient's length of stay.  Recommendations for other services: None    Skilled Therapeutic Intervention Pt received resting in bed for skilled OT session with focus on  comprehensive OT evaluation. Pt  agreeable to interventions, demonstrating overall pleasant mood. Pt with no reports of pain, stating ". . . Just tired." OT offering intermediate rest breaks and positioning suggestions throughout session to address pain/fatigue and maximize participation/safety in session.   Session began with introduction to OT role, OT POC, and general orientation to rehab unit/schedule. Pt then completes full-body sponge-bathing with assist levels noted below, requiring CGA+ cuing for sitting balance at EOB. Pt provided with full-supervision of breakfast, requiring cuing for small bites/sips and alternating between food/drink to prevent coughing. Pt increasingly lethargic at end of session, advised to call nursing staff for assistance in returning to bed.   Pt remained sitting in [facility] with all immediate needs met at end of session. Pt continues to be appropriate for skilled OT intervention to promote further functional independence.   ADL ADL Eating: Supervision/safety Where Assessed-Eating: Wheelchair Grooming: Supervision/safety Where Assessed-Grooming: Wheelchair Upper Body Bathing: Setup Where Assessed-Upper Body Bathing: Edge of bed Lower Body Bathing: Contact guard Where Assessed-Lower Body Bathing: Edge of bed Upper Body Dressing: Setup Where Assessed-Upper Body Dressing: Edge of bed Lower Body Dressing: Contact guard Where Assessed-Lower Body Dressing: Edge of bed Toileting: Contact guard Where Assessed-Toileting: Teacher, adult education: Furniture conservator/restorer Method: Proofreader: Psychiatric nurse: Not assessed Mobility  Transfers Sit to Stand: Minimal Assistance - Patient > 75% Stand to Sit: Contact Guard/Touching assist   Discharge Criteria: Patient will be discharged from OT if patient refuses treatment 3 consecutive times without medical reason, if treatment goals not met, if there is a change in  medical status, if patient makes no progress towards goals or if patient is discharged from hospital.  The above assessment, treatment plan, treatment alternatives and goals were discussed and mutually agreed upon: by patient  Lou Cal, OTR/L, MSOT  07/08/2022, 9:37 AM

## 2022-07-09 LAB — GLUCOSE, CAPILLARY
Glucose-Capillary: 143 mg/dL — ABNORMAL HIGH (ref 70–99)
Glucose-Capillary: 203 mg/dL — ABNORMAL HIGH (ref 70–99)
Glucose-Capillary: 187 mg/dL — ABNORMAL HIGH (ref 70–99)
Glucose-Capillary: 191 mg/dL — ABNORMAL HIGH (ref 70–99)

## 2022-07-09 LAB — HEMOGLOBIN AND HEMATOCRIT, BLOOD
HCT: 22.8 % — ABNORMAL LOW (ref 39.0–52.0)
Hemoglobin: 7.4 g/dL — ABNORMAL LOW (ref 13.0–17.0)

## 2022-07-09 MED ORDER — ADULT MULTIVITAMIN W/MINERALS CH
1.0000 | ORAL_TABLET | Freq: Every day | ORAL | Status: DC
  Administered 2022-07-09 – 2022-07-18 (×10): 1 via ORAL
  Filled 2022-07-09 (×10): qty 1

## 2022-07-09 MED ORDER — MENTHOL 3 MG MT LOZG
1.0000 | LOZENGE | OROMUCOSAL | Status: DC | PRN
  Administered 2022-07-09 – 2022-07-11 (×5): 3 mg via ORAL
  Filled 2022-07-09 (×2): qty 9

## 2022-07-09 MED ORDER — ENSURE ENLIVE PO LIQD
237.0000 mL | Freq: Two times a day (BID) | ORAL | Status: DC
  Administered 2022-07-09 – 2022-07-16 (×12): 237 mL via ORAL

## 2022-07-09 NOTE — Progress Notes (Signed)
NIF -40 VC device unavailable.

## 2022-07-09 NOTE — Progress Notes (Addendum)
PROGRESS NOTE   Subjective/Complaints:  No PRN xanax overnight. NIF -40. Vitals stable.  Patient states he is doing well today, getting a little bit stronger every day.  States anxiety is doing a little bit better.  Still having some soreness in his throat from being intubated, but is working on improving p.o. intakes to remove cortrack.  Sleeping well.  ROS: Denies fevers, chills, N/V, abdominal pain, constipation, diarrhea, SOB, cough, chest pain, new weakness or paraesthesias.    Objective:   No results found. Recent Labs    07/07/22 0540 07/08/22 0453  WBC 7.0 7.7  HGB 7.3* 7.0*  HCT 22.1* 21.4*  PLT 305 319    Recent Labs    07/07/22 1601 07/08/22 0453  NA 137 135  K 4.5 3.9  CL 107 101  CO2 20* 24  GLUCOSE 180* 185*  BUN 19 23  CREATININE 0.85 0.97  CALCIUM 8.3* 8.0*     Intake/Output Summary (Last 24 hours) at 07/09/2022 1006 Last data filed at 07/09/2022 0842 Gross per 24 hour  Intake 538 ml  Output 1100 ml  Net -562 ml         Physical Exam: Vital Signs Blood pressure 106/68, pulse 93, temperature 98.4 F (36.9 C), temperature source Oral, resp. rate 16, height 5\' 5"  (1.651 m), weight 48 kg, SpO2 100 %.    Constitutional: No apparent distress.  Laying in bed. HENT: No JVD. Neck Supple. Trachea midline. Atraumatic, normocephalic. +Cortrak Eyes: PERRLA. EOMI.  Cardiovascular: Regular rate, regular rhythm, no murmurs/rub/gallops. No Edema. Peripheral pulses 2+  Respiratory: CTAB. No rales, rhonchi, or wheezing. On RA.  Abdomen: + bowel sounds, normoactive. No distention or tenderness.  GU: Not examined. + condom cath, draining clear urine.  Skin: ecchymotic area LLQ and multiple small bruises from heparin injection.    MSK:      No apparent deformity.      Strength: Ambulates with rolling walker at a contact-guard level to and from the toilet, with prolonged break to sit at rest from  fatigue.                RUE: 4/5 throughout                LUE: 5-/5 throughout-improved                RLE: 5-/5 throughout-improved                LLE:  5-/5 throughout-improved   Neurologic exam:  Cognition: AAO to person, place, time.  No apparent cognitive deficits, is complicated by underlying anxiety. Insight: Good insight into current condition.  Mood: Anxious mood.  Appropriate affect.  Smiling today, appears in better spirits. Sensation: To light touch intact in BL UEs and LEs  Reflexes: 1+ in BL UE and LEs. Negative Hoffman's and babinski signs bilaterally.  CN: + R ptosis. + mild R facial droop-unchanged Coordination: Bilateral upper extremity tremors with antigravity range of motion  spasticity: MAS 0 in all extremities.     Assessment/Plan: 1. Functional deficits which require 3+ hours per day of interdisciplinary therapy in a comprehensive inpatient rehab setting. Physiatrist is providing close team supervision  and 24 hour management of active medical problems listed below. Physiatrist and rehab team continue to assess barriers to discharge/monitor patient progress toward functional and medical goals  Care Tool:  Bathing    Body parts bathed by patient: Right arm, Left arm, Chest, Abdomen, Front perineal area, Buttocks, Right upper leg, Left upper leg, Right lower leg, Left lower leg, Face         Bathing assist Assist Level: Contact Guard/Touching assist     Upper Body Dressing/Undressing Upper body dressing   What is the patient wearing?: Pull over shirt    Upper body assist Assist Level: Set up assist    Lower Body Dressing/Undressing Lower body dressing      What is the patient wearing?: Pants     Lower body assist Assist for lower body dressing: Minimal Assistance - Patient > 75%     Toileting Toileting Toileting Activity did not occur (Clothing management and hygiene only): N/A (no void or bm)  Toileting assist       Transfers Chair/bed  transfer  Transfers assist     Chair/bed transfer assist level: Minimal Assistance - Patient > 75%     Locomotion Ambulation   Ambulation assist      Assist level: Minimal Assistance - Patient > 75% Assistive device: Walker-rolling Max distance: 50   Walk 10 feet activity   Assist     Assist level: Minimal Assistance - Patient > 75% Assistive device: Walker-rolling   Walk 50 feet activity   Assist    Assist level: Minimal Assistance - Patient > 75% Assistive device: Walker-rolling    Walk 150 feet activity   Assist Walk 150 feet activity did not occur: Safety/medical concerns (2/2 fatigue)         Walk 10 feet on uneven surface  activity   Assist     Assist level: Minimal Assistance - Patient > 75% Assistive device: Walker-rolling   Wheelchair     Assist Is the patient using a wheelchair?: No   Wheelchair activity did not occur: N/A         Wheelchair 50 feet with 2 turns activity    Assist    Wheelchair 50 feet with 2 turns activity did not occur: N/A       Wheelchair 150 feet activity     Assist  Wheelchair 150 feet activity did not occur: N/A       Blood pressure 106/68, pulse 93, temperature 98.4 F (36.9 C), temperature source Oral, resp. rate 16, height 5\' 5"  (1.651 m), weight 48 kg, SpO2 100 %.  Medical Problem List and Plan: 1. Functional deficits secondary to myasthenia gravis flare             -patient may shower             -ELOS/Goals: 18-21 days, Min A PT/OT/SLP             -Appreciate hospitalist discharge notation: Medications that may worsen or trigger MG exacerbation: Class IA antiarrhythmics, magnesium, flouroquinolones, macrolides, aminoglycosides, penicillamine, curare, interferon alpha, botox, quinine. Use with caution: calcium channel blockers, beta blockers and statins.    2.  Antithrombotics: -DVT/anticoagulation:  Pharmaceutical: Lovenox-> SCDs 6-9, see below             -antiplatelet  therapy: N/A   3. Pain Management: Tylenol prn.    4. Mood/Behavior/Sleep: Uses Ambien 10 mg as needed at home. LCSW to follow for evaluation and support. Team to provide ego support.              -  antipsychotic agents: N/A             --Melatonin prn for insomnia.    5. Neuropsych/cognition: This patient is capable of making decisions on his own behalf. 6. Skin/Wound Care: Routine pressure relief measures 7. Fluids/Electrolytes/Nutrition/dysphagia: Monitor I/O. Change tube feed to 60  cc/hr X 10 hours w/H2O qid.  --start Calorie count tomorrow. Wife encouraged to bring soft foods from home.               --downgrade diet to D2--reports feeling food sticking in throat/throat discomfort from intubations             --MMW added.   -6/9: He is gradually improving, however remain inadequate.  Wait for nutrition This week, should be able to remove core track of p.o.'s improvement next few days.  Added Cepacol lozenge for throat discomfort.   8. MG flare w/ respiratory failure: Has completed course of IVIG.  --Continue to monitor NIF/VC--> NIF -40 VC-1.2 L this am.              --Continue home regimen of Mestinon and Prednisone.    9. Anxiety: Continue Klonopin, Paxil and prn Xanax. Continue to provide ego support.  --Patient frustrated/expressing disappointment that he has not rebounded as in the past. Continue to encourage/provide support.  -- Would benefit from neuropsych evaluation --6-8: Encouraged family regarding patient's low energy, attributable to current MG flare in addition to medications required for mood control.  Will add BuSpar 5 mg twice daily, wean Klonopin to 0.5 mg twice daily if tolerated. -6/9: Anxiety improving, has not required as needed Xanax the last 2 days.  Monitor tonight, consider DC scheduled Klonopin in the a.m. if he remains stable   10. T2DM: Hgb A1c--5.7 and well controlled. Will monitor BS ac/hs and use SSI for elevated BS             --continue to hold  Metformin and Januvia (discussed need to increase intake w/patient)  -- 6-8: Elevated, eating 25 to 50% of meals.  Continue monitoring, consider addition of Semglee or resuming metformin when appropriate.  -6/9: Relatively stable, monitor  Recent Labs    07/08/22 1747 07/08/22 2103 07/09/22 0607  GLUCAP 127* 151* 143*        11. Hep B: On Descovy as substitute-->wife to bring Entecavir from home.    12. Hypotension/Tachycardia: Ongoing for weeks. Inpatient workup including CTA chest and lower extremity venous duplex negative  - Monitor for any signs of decline in his NIF or vital capacity, if any worsening discontinue Cardizem.   13. Anemia - HgB 7.9 -> 7.3 -> 7.0 6/8. Stool card pending. Recheck h/h 6/9, hold lovenox, start SCDs. if below 7 will transfuse.  6/9: H&H 7.4; monitor with a.m. labs.  Only obvious cause at this time is Lovenox so we will continue to hold with SCDs; mobility is improving   LOS: 2 days A FACE TO FACE EVALUATION WAS PERFORMED  Angelina Sheriff 07/09/2022, 10:06 AM

## 2022-07-09 NOTE — Progress Notes (Signed)
RT NOTE:  NIF: greater than -40 VC device unavailable.

## 2022-07-09 NOTE — Progress Notes (Signed)
NIF -40 VC not available

## 2022-07-09 NOTE — Progress Notes (Signed)
Initial Nutrition Assessment  DOCUMENTATION CODES:   Severe malnutrition in context of acute illness/injury, Underweight  INTERVENTION:  Ensure Plus High Protein po BID, each supplement provides 350 kcal and 20 grams of protein.  MVI  Continue to offer assistance and encourage po intake at meal times   Advance diet per SLP recs   Continue TF's via cortrak  Osmolite 1.5 @60  ml/hr x 10hrs per day (600 mL) Prosource TF20 daily -provides 900 kcal, 58 g protein, 457 ml fluid   100 ml q6 hrs free water   D/c juven- no pressure wounds noted in chart   NUTRITION DIAGNOSIS:   Severe Malnutrition related to acute illness as evidenced by moderate fat depletion, moderate muscle depletion, percent weight loss.   GOAL:   Patient will meet greater than or equal to 90% of their needs   MONITOR:   PO intake, Labs, I & O's, Weight trends, Diet advancement, Supplement acceptance, TF tolerance, Skin  REASON FOR ASSESSMENT:   Consult Assessment of nutrition requirement/status  ASSESSMENT:   67 y.o. male with PMHx including MG, T2DM, anxiety, and HTN who is now admitted to CIR as he continues to recover from MG flare  Patient's main nutrition source has been TF's via Cortrak- tolerating Osmolite 1.5 @60  ml/hr x 10 hours at night and is also on po pureed diet. Patient has been reported to be lethargic and needs copious assistance at mealtimes. Calorie count was ongoing before admission to CIR and patient was eating 10-50% of meals and minimal Ensure.   Labs: Glu 185, AST 52, ALT 64 Meds: Vitamin D3, klonopin, insulin, juven BID, Osmolite 1.5 @60  ml/hr + Prosource TF20 daily, protonix, deltasone Wt: 7.8 kg (14%) wt loss x 5 months  07/09/22 48 kg  07/07/22 53.8 kg  06/21/22 51.4 kg  06/16/22 53 kg  02/03/22 55.3 kg  PO: 65% meal intake at breakfast this morning  I/O's: -1.1 L     NUTRITION - FOCUSED PHYSICAL EXAM:  RD working remotely- patient was noted to have severe acute  malnutrition due to moderate muscle and fat wasting PTA to CIR   Diet Order:   Diet Order             DIET - DYS 1 Room service appropriate? Yes; Fluid consistency: Thin  Diet effective now                   EDUCATION NEEDS:   Education needs have been addressed  Skin:  Skin Assessment: Reviewed RN Assessment  Last BM:  6/8 type 4  Height:   Ht Readings from Last 1 Encounters:  07/07/22 5\' 5"  (1.651 m)    Weight:   Wt Readings from Last 1 Encounters:  07/09/22 48 kg    Ideal Body Weight:     BMI:  Body mass index is 17.61 kg/m.  Estimated Nutritional Needs:   Kcal:  1500-1700  Protein:  80-100 g  Fluid:  > 1.7 L    Leodis Rains, RDN, LDN  Clinical Nutrition

## 2022-07-10 ENCOUNTER — Encounter (HOSPITAL_COMMUNITY): Payer: Self-pay | Admitting: Physical Medicine and Rehabilitation

## 2022-07-10 DIAGNOSIS — F411 Generalized anxiety disorder: Secondary | ICD-10-CM

## 2022-07-10 DIAGNOSIS — D649 Anemia, unspecified: Secondary | ICD-10-CM

## 2022-07-10 DIAGNOSIS — R1310 Dysphagia, unspecified: Secondary | ICD-10-CM

## 2022-07-10 DIAGNOSIS — Z794 Long term (current) use of insulin: Secondary | ICD-10-CM

## 2022-07-10 DIAGNOSIS — E119 Type 2 diabetes mellitus without complications: Secondary | ICD-10-CM

## 2022-07-10 LAB — CBC
HCT: 24.4 % — ABNORMAL LOW (ref 39.0–52.0)
Hemoglobin: 7.8 g/dL — ABNORMAL LOW (ref 13.0–17.0)
MCH: 28.6 pg (ref 26.0–34.0)
MCHC: 32 g/dL (ref 30.0–36.0)
MCV: 89.4 fL (ref 80.0–100.0)
Platelets: 422 10*3/uL — ABNORMAL HIGH (ref 150–400)
RBC: 2.73 MIL/uL — ABNORMAL LOW (ref 4.22–5.81)
RDW: 16.2 % — ABNORMAL HIGH (ref 11.5–15.5)
WBC: 7.5 10*3/uL (ref 4.0–10.5)
nRBC: 0 % (ref 0.0–0.2)

## 2022-07-10 LAB — GLUCOSE, CAPILLARY
Glucose-Capillary: 151 mg/dL — ABNORMAL HIGH (ref 70–99)
Glucose-Capillary: 210 mg/dL — ABNORMAL HIGH (ref 70–99)
Glucose-Capillary: 223 mg/dL — ABNORMAL HIGH (ref 70–99)
Glucose-Capillary: 250 mg/dL — ABNORMAL HIGH (ref 70–99)

## 2022-07-10 LAB — BRAIN NATRIURETIC PEPTIDE: B Natriuretic Peptide: 47.9 pg/mL (ref 0.0–100.0)

## 2022-07-10 NOTE — Progress Notes (Signed)
Pt unavailable this AM for NIF & VC. RT will attempt again this afternoon.

## 2022-07-10 NOTE — IPOC Note (Signed)
Overall Plan of Care [facility]) Patient Details Name: [name] MRN: [id number] DOB: 03-03-1955  Admitting Diagnosis: Myasthenia exacerbation [facility])  Hospital Problems: Principal Problem:   Myasthenia exacerbation (HCC)     Functional Problem List: Nursing Safety, Bladder, Bowel, Sensory, Endurance, Medication Management, Motor, Pain  PT Balance, Endurance, Motor, Nutrition, Pain, Safety  OT Balance, Endurance, Nutrition, Safety  SLP Cognition, Nutrition, Behavior, Linguistic  TR         Basic ADL's: OT Bathing, Dressing, Toileting     Advanced  ADL's: OT       Transfers: PT Bed Mobility, Bed to Chair, Customer service manager, Tub/Shower     Locomotion: PT Ambulation, Stairs     Additional Impairments: OT    SLP Swallowing, Communication, Social Cognition expression Memory, Problem Solving  TR      Anticipated Outcomes Item Anticipated Outcome  Self Feeding    Swallowing  Supervision   Basic self-care  Mod I  Toileting  Mod I   Bathroom Transfers Mod I  Bowel/Bladder  continent B/B  Transfers  modI  Economist  Cognition  Supervision  Pain  less than 4  Safety/Judgment  remain fall free   Therapy Plan: PT Intensity: Minimum of 1-2 x/day ,45 to 90 minutes PT Frequency: 5 out of 7 days PT Duration Estimated Length of Stay: 7-10 days OT Intensity: Minimum of 1-2 x/day, 45 to 90 minutes OT Frequency: 5 out of 7 days OT Duration/Estimated Length of Stay: 7-10 days SLP Intensity: Minumum of 1-2 x/day, 30 to 90 minutes SLP Frequency: 1 to 3 out of 7 days SLP Duration/Estimated Length of Stay: 7-10 days   Team Interventions: Nursing Interventions Patient/Family Education, Medication Management, Psychosocial Support, Bladder Management, Bowel Management, Disease Management/Prevention, Dysphagia/Aspiration Precaution Training, Pain Management, Discharge Planning  PT interventions Ambulation/gait training, Balance/vestibular  training, Community reintegration, Discharge planning, Disease management/prevention, DME/adaptive equipment instruction, Functional mobility training, Pain management, Patient/family education, Stair training, Therapeutic Activities, Therapeutic Exercise, UE/LE Strength taining/ROM, UE/LE Coordination activities  OT Interventions Warden/ranger, Firefighter, Discharge planning, Disease mangement/prevention, Fish farm manager, Functional mobility training, Patient/family education, Self Care/advanced ADL retraining, Therapeutic Activities, Therapeutic Exercise, UE/LE Strength taining/ROM  SLP Interventions Cognitive remediation/compensation, Cueing hierarchy, Dysphagia/aspiration precaution training, Environmental controls, Functional tasks, Patient/family education, Therapeutic Activities  TR Interventions    SW/CM Interventions Discharge Planning, Psychosocial Support, Patient/Family Education   Barriers to Discharge MD  Medical stability  Nursing Decreased caregiver support, Home environment access/layout, Incontinence, Insurance for SNF coverage home with spouse to 1 level 6 ste entry no rail  PT Nutrition means    OT Nutrition means    SLP Nutrition means    SW       Team Discharge Planning: Destination: PT-Home ,OT- Home , SLP-Home Projected Follow-up: PT-Outpatient PT, OT-  Outpatient OT, SLP-24 hour supervision/assistance, Home Health SLP, Outpatient SLP Projected Equipment Needs: PT-To be determined, OT- To be determined, SLP-None recommended by SLP Equipment Details: PT- , OT-  Patient/family involved in discharge planning: PT- Patient,  OT-Patient, Family member/caregiver, SLP-Patient  MD ELOS: 18-21 Medical Rehab Prognosis:  Good Assessment: The patient has been admitted for CIR therapies with the diagnosis of myasthenia gravis flare. The team will be addressing functional mobility, strength, stamina, balance, safety, adaptive  techniques and equipment, self-care, bowel and bladder mgt, patient and caregiver education. Goals have been set at Min A. Anticipated discharge destination is home.        See Team Conference Notes for  weekly updates to the plan of care

## 2022-07-10 NOTE — Progress Notes (Signed)
Occupational Therapy Session Note  Patient Details  Name: [name] MRN: [id number] Date of Birth: 10/09/1955  Today's Date: 07/10/2022 OT Individual Time: 4098-1191 OT Individual Time Calculation (min): 32 min  and Today's Date: 07/10/2022 OT Missed Time: 40 Minutes Missed Time Reason: Patient fatigue;Other (comment) (Low BP)   Short Term Goals: Week 1:  OT Short Term Goal 1 (Week 1): STGs=LTGS due to patient's length of stay.  Skilled Therapeutic Interventions/Progress Updates:  Pt received sitting in [facility] for skilled OT session with initial focus on BADL retraining. Pt agreeable to interventions, demonstrating overall pleasant mood. Pt with un-rated headache pain, provided rest as needed.   OT retrieves all items for shower and discusses recent vitals with RN, as patient was orthostatic on evaluation. OT then performs orthostatic vitals, noted below:  Sitting: BP=93/59 HR=97 O2=100  Standing: BP=79/55 HR=101 O2=100  Pt then returned to supine due to increased fatigue/alertness. Pt performs all transfers with CGA + RW. Pt placed in trendelenburg, vitals retaken: BP=89/62 HR=85 O2=100  Communicated findings to treatment team, pt missed ~40 mins of skilled therapy to be made up as appropriate.   Pt remained resting in bed with all immediate needs met at end of session. Pt continues to be appropriate for skilled OT intervention to promote further functional independence.   Therapy Documentation Precautions:  Precautions Precautions: Fall, Other (comment) Precaution Comments: Watch BP and RR; Aspiration Restrictions Weight Bearing Restrictions: No   Therapy/Group: Individual Therapy  Lou Cal, OTR/L, MSOT  07/10/2022, 6:20 AM

## 2022-07-10 NOTE — Progress Notes (Signed)
Not wanting to use the SCDs due to being uncomfortable. Given PRN medication earlier in the night for restlessness and anxiety. Continues to be restless throughout the night. Difficulty sleeping. Providing emotional support and assisting as needed with physical needs. Providing emotional support through the night.

## 2022-07-10 NOTE — Progress Notes (Signed)
NIF= -30 with good pt effort. VC unavailable.

## 2022-07-10 NOTE — Progress Notes (Signed)
Speech Language Pathology Daily Session Note  Patient Details  Name: [name] MRN: [id number] Date of Birth: 06/14/1955  Today's Date: 07/10/2022 SLP Individual Time: 1100-1203 SLP Individual Time Calculation (min): 63 min  Short Term Goals: Week 1: SLP Short Term Goal 1 (Week 1): STGs=LTGs due to ELOS  Skilled Therapeutic Interventions:  Pt was seen in late am to address cognitive re- training. Pt was easily alerted with verbal stimulation upon SLP arrival however, required additional time before achieving full alertness. Pt endorsed fatigue however, he was agreeable for ST session. Pt requested to go to the bathroom. He ambulated to the bathroom with min to mod A for safety (I.e. navigating walker and waiting for assist.). Pt was continent of both bowel and bladder. He ambulated back to [facility] for remainder of session. Pt completed oral hygiene with use of oral suction. He was presented with D3 trials. Pt observed to administer larger boluses with prolonged manipulation, mastication, and min lingual residue. He stated, "This is kind of hard to chew." SLP instructed pt in amount modifications ~ 1/2 tsp, slow rate, and frequent liquids washes. He consumed thin liquid via straw without s/sx pen/asp. In additional minutes of session, SLP challenged pt in functional communication. Pt with decreased vocal intensity requiring cues for repetition with inconsistent return. SLP instructed pt in use of breath support and over articulation strategies to improve speech intelligibility. With use of strategies pt's speech intelligibility improved from 60% to 75%. Pt requested transition back to bed at conclusion of session. Pt was left at bedside with bed alarm active and call button within reach. SLP to continue POC.   Pain Pain Assessment Pain Scale: 0-10 Pain Score: 0-No pain  Therapy/Group: Individual Therapy  Renaee Munda 07/10/2022, 12:46 PM

## 2022-07-10 NOTE — Progress Notes (Signed)
Inpatient Rehabilitation  Patient information reviewed and entered into eRehab system by Helena Sardo M. Jaden Batchelder, M.A., CCC/SLP, PPS Coordinator.  Information including medical coding, functional ability and quality indicators will be reviewed and updated through discharge.    

## 2022-07-10 NOTE — Progress Notes (Signed)
PROGRESS NOTE   Subjective/Complaints:  No new concerns this AM. Reports sleeping well. Denies pain. Reports he feels stronger every day.   Sleeping well.  ROS: Denies fevers, chills, cough,  N/V, abdominal pain, constipation, diarrhea, SOB, cough, chest pain, new weakness or paraesthesias.    Objective:   No results found. Recent Labs    07/08/22 0453 07/09/22 1036 07/10/22 0603  WBC 7.7  --  7.5  HGB 7.0* 7.4* 7.8*  HCT 21.4* 22.8* 24.4*  PLT 319  --  422*    Recent Labs    07/07/22 1601 07/08/22 0453  NA 137 135  K 4.5 3.9  CL 107 101  CO2 20* 24  GLUCOSE 180* 185*  BUN 19 23  CREATININE 0.85 0.97  CALCIUM 8.3* 8.0*     Intake/Output Summary (Last 24 hours) at 07/10/2022 0815 Last data filed at 07/09/2022 1800 Gross per 24 hour  Intake 598 ml  Output 325 ml  Net 273 ml         Physical Exam: Vital Signs Blood pressure (!) 86/50, pulse 83, temperature 98.3 F (36.8 C), temperature source Oral, resp. rate 20, height 5\' 5"  (1.651 m), weight 52.7 kg, SpO2 100 %.    Constitutional: No apparent distress.  Laying in bed. Appears comfortable HENT: No JVD. Neck Supple. Trachea midline. Atraumatic, normocephalic. +Cortrak Cardiovascular: RRR,   Respiratory: CTAB. No rales, rhonchi, or wheezing. On RA.  Abdomen: + bowel sounds, normoactive. No distention or tenderness.  GU: Not examined. + condom cath, draining clear urine.  Skin: ecchymotic area LLQ and multiple small bruises from heparin injection.    MSK:      No apparent deformity.                RUE: 4/5 throughout                LUE: 5-/5 throughout-improved                RLE: 5-/5 throughout-improved                LLE:  5-/5 throughout-improved   Neurologic exam:  Cognition: AAO to person, place, time.  No apparent cognitive deficits Insight: Good insight into current condition.  Mood: Anxious mood.  Appropriate affect.  Appears in good  spirits today Sensation: To light touch intact in BL UEs and LEs  Reflexes: 1+ in BL UE and LEs. Negative Hoffman's and babinski signs bilaterally.  CN: + R ptosis. + mild R facial droop-unchanged Coordination: Bilateral upper extremity tremors with antigravity range of motion  spasticity: MAS 0 in all extremities.     Assessment/Plan: 1. Functional deficits which require 3+ hours per day of interdisciplinary therapy in a comprehensive inpatient rehab setting. Physiatrist is providing close team supervision and 24 hour management of active medical problems listed below. Physiatrist and rehab team continue to assess barriers to discharge/monitor patient progress toward functional and medical goals  Care Tool:  Bathing    Body parts bathed by patient: Right arm, Left arm, Chest, Abdomen, Front perineal area, Buttocks, Right upper leg, Left upper leg, Right lower leg, Left lower leg, Face  Bathing assist Assist Level: Contact Guard/Touching assist     Upper Body Dressing/Undressing Upper body dressing   What is the patient wearing?: Pull over shirt    Upper body assist Assist Level: Set up assist    Lower Body Dressing/Undressing Lower body dressing      What is the patient wearing?: Pants     Lower body assist Assist for lower body dressing: Minimal Assistance - Patient > 75%     Toileting Toileting Toileting Activity did not occur (Clothing management and hygiene only): N/A (no void or bm)  Toileting assist Assist for toileting: Contact Guard/Touching assist     Transfers Chair/bed transfer  Transfers assist     Chair/bed transfer assist level: Minimal Assistance - Patient > 75%     Locomotion Ambulation   Ambulation assist      Assist level: Minimal Assistance - Patient > 75% Assistive device: Walker-rolling Max distance: 50   Walk 10 feet activity   Assist     Assist level: Minimal Assistance - Patient > 75% Assistive device:  Walker-rolling   Walk 50 feet activity   Assist    Assist level: Minimal Assistance - Patient > 75% Assistive device: Walker-rolling    Walk 150 feet activity   Assist Walk 150 feet activity did not occur: Safety/medical concerns (2/2 fatigue)         Walk 10 feet on uneven surface  activity   Assist     Assist level: Minimal Assistance - Patient > 75% Assistive device: Walker-rolling   Wheelchair     Assist Is the patient using a wheelchair?: No   Wheelchair activity did not occur: N/A         Wheelchair 50 feet with 2 turns activity    Assist    Wheelchair 50 feet with 2 turns activity did not occur: N/A       Wheelchair 150 feet activity     Assist  Wheelchair 150 feet activity did not occur: N/A       Blood pressure (!) 86/50, pulse 83, temperature 98.3 F (36.8 C), temperature source Oral, resp. rate 20, height 5\' 5"  (1.651 m), weight 52.7 kg, SpO2 100 %.  Medical Problem List and Plan: 1. Functional deficits secondary to myasthenia gravis flare             -patient may shower             -ELOS/Goals: 18-21 days, Min A PT/OT/SLP             -Appreciate hospitalist discharge notation: Medications that may worsen or trigger MG exacerbation: Class IA antiarrhythmics, magnesium, flouroquinolones, macrolides, aminoglycosides, penicillamine, curare, interferon alpha, botox, quinine. Use with caution: calcium channel blockers, beta blockers and statins.   -Team conference tomorrow   2.  Antithrombotics: -DVT/anticoagulation:  Pharmaceutical: Lovenox-> SCDs 6-9, see below             -antiplatelet therapy: N/A   3. Pain Management: Tylenol prn.    4. Mood/Behavior/Sleep: Uses Ambien 10 mg as needed at home. LCSW to follow for evaluation and support. Team to provide ego support.              -antipsychotic agents: N/A             --Melatonin prn for insomnia.    5. Neuropsych/cognition: This patient is capable of making decisions on his  own behalf. 6. Skin/Wound Care: Routine pressure relief measures 7. Fluids/Electrolytes/Nutrition/dysphagia: Monitor I/O. Change tube feed to 60  cc/hr X 10 hours w/H2O qid.  --start Calorie count tomorrow. Wife encouraged to bring soft foods from home.               --downgrade diet to D2--reports feeling food sticking in throat/throat discomfort from intubations             --MMW added.   -6/9: He is gradually improving, however remain inadequate.  Wait for nutrition This week, should be able to remove core track of p.o.'s improvement next few days.  Added Cepacol lozenge for throat discomfort.  -6/10 appears to be eating more, consider remove cor track in new few days   8. MG flare w/ respiratory failure: Has completed course of IVIG.  --Continue to monitor NIF/VC--> NIF -40 VC-1.2 L this am.              --Continue home regimen of Mestinon and Prednisone.    9. Anxiety: Continue Klonopin, Paxil and prn Xanax. Continue to provide ego support.  --Patient frustrated/expressing disappointment that he has not rebounded as in the past. Continue to encourage/provide support.  -- Would benefit from neuropsych evaluation --6-8: Encouraged family regarding patient's low energy, attributable to current MG flare in addition to medications required for mood control.  Will add BuSpar 5 mg twice daily, wean Klonopin to 0.5 mg twice daily if tolerated. -6/9: Anxiety improving, has not required as needed Xanax the last 2 days.  Monitor tonight, consider DC scheduled Klonopin in the a.m. if he remains stable 6/10 one use of xanax last night, overall anxiety improved   10. T2DM: Hgb A1c--5.7 and well controlled. Will monitor BS ac/hs and use SSI for elevated BS             --continue to hold Metformin and Januvia (discussed need to increase intake w/patient)  -- 6-8: Elevated, eating 25 to 50% of meals.  Continue monitoring, consider addition of Semglee or resuming metformin when appropriate.  -6/10 fair  control overall, continue current regimen  Recent Labs    07/09/22 1703 07/09/22 2158 07/10/22 0712  GLUCAP 187* 191* 151*        11. Hep B: On Descovy as substitute-->wife to bring Entecavir from home.    12. Hypotension/Tachycardia: Ongoing for weeks. Inpatient workup including CTA chest and lower extremity venous duplex negative  - Monitor for any signs of decline in his NIF or vital capacity, if any worsening discontinue Cardizem.   13. Anemia - HgB 7.9 -> 7.3 -> 7.0 6/8. Stool card pending. Recheck h/h 6/9, hold lovenox, start SCDs. if below 7 will transfuse.  6/9: H&H 7.4; monitor with a.m. labs.  Only obvious cause at this time is Lovenox so we will continue to hold with SCDs; mobility is improving  6/10 HGB up to 7.8, continue to monitor    LOS: 3 days A FACE TO FACE EVALUATION WAS PERFORMED  Fanny Dance 07/10/2022, 8:15 AM

## 2022-07-10 NOTE — Progress Notes (Signed)
Physical Therapy Session Note  Patient Details  Name: [name] MRN: [id number] Date of Birth: [birth year]  Today's Date: 07/10/2022 PT Individual Time: 6962-9528 PT Individual Time Calculation (min): 74 min   Short Term Goals: Week 1:  PT Short Term Goal 1 (Week 1): STG=LTG 2/2 LOS  Skilled Therapeutic Interventions/Progress Updates:    Pt presents in room in bed, asleep but awakens easily, motivated to participate with PT. Pt denies symptoms at this time but does report BP was low this AM. Pt denies pain. Session focused on gait training for tolerance to upright mobility, education on energy conservation throughout, and therex focused on interval training to promote improved tolerance to activity.  Pt completes supine to long sitting with supervision in bed with use of hospital bed rails. Therapist dons TED hose and socks total assist for time management. Pt dons undershirt, shirt, and pants with supervision for managing NG tubing in supine/long sitting. Pt completes supine to sit with supervision. Pt completes ambulatory transfer with RW CGA from bed to WC.  Pt transported dependently in WC to main gym for time management. Pt ambulates 110' and self selected distance 2x105' with extended seated rest break between gait trials. Pt demonstrates slow gait speed for straightaways 0.23 m/s. During rest breaks pt educated on energy conservation to improve global endurance.  Pt then transported to day room dependently in [facility] for time management. Pt completes transfer WC>nustep with CGA RW. Pt completes interval training on nustep with BUE/BLE 30 sec work/30 sec rest for total on Workload L2. Pt maintains SPM 52-56 with each interval. Pt completes squat pivot transfer no RW with min assist.  Pt returned to room dependently in [facility] for time management, completes ambulatory transfer with RW CGA to bed. Pt completes sit to supine with supervision and requests to change back into nightgown. Pt doffs  shirt and undershirt with supervision for NG tube management, pt doffs pants with min assist for managing over ankles with nonslip socks. Therapist doffs socks and TED hose total assist for time management. Pt remains supine with all four rails up per pt request, bed alarm activated, all needs within reach and call light in place at end of session.  Therapy Documentation Precautions:  Precautions Precautions: Fall, Other (comment) Precaution Comments: Watch BP and RR; Aspiration Restrictions Weight Bearing Restrictions: No General:   Vital Signs: Supine prior to activity: BP 120/71, HR 106bpm Seated following walking: BP 117/72, HR 115bpm Seated following interval training on Nustep: BP 113/66, HR 109bpm    Therapy/Group: Individual Therapy  Edwin Cap PT, DPT 07/10/2022, 3:44 PM

## 2022-07-10 NOTE — Progress Notes (Signed)
Nutrition Quick Note:   Calorie count was ordered on 6/8. Calorie count got missed. RD will start a new calorie count today for 48 hrs.   Bethann Humble, RD, LDN, CNSC.

## 2022-07-10 NOTE — Progress Notes (Signed)
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 He has never used smokeless tobacco. He reports that he does not drink alcohol and does not use drugs.  Family / Support Systems Marital Status: Married Patient Roles: Spouse, Parent Spouse/Significant Other: [place] [phone] Children: Chris-son [phone]  Daughter local also Other Supports: Church members Anticipated Caregiver: wife and children Ability/Limitations of Caregiver: Children work but International aid/development worker able to assist Caregiver Availability: 24/7 Family Dynamics: Close knit family all three are rotating being here with pt. All feel he will have what he needs at home upon discharge. Happy he is here on rehab  Social History Preferred language: English Religion: Christian Cultural Background: [place] does speak english and does not need an Engineer, technical sales while here Education: some school Primary school teacher - How often do you need to have someone help you when you read instructions, pamphlets, or other written material from your  doctor or pharmacy?: Never Writes: Yes (native language) Employment Status: Retired Marine scientist Issues: No issues Guardian/Conservator: None-according to MD pt is capable of making his own decisions while here.  Family member plans to be here daily   Abuse/Neglect Abuse/Neglect Assessment Can Be Completed: Yes Physical Abuse: Denies Verbal Abuse: Denies Sexual Abuse: Denies Exploitation of patient/patient's resources: Denies Self-Neglect: Denies  Patient response to: Social Isolation - How often do you feel lonely or isolated from those around you?: Never  Emotional Status Pt's affect, behavior and adjustment status: Pt hopes to get stronger here and get back to his independent level. He is trying to eat more so as to get the NG tube out. He is motivated to be here and hopes to do well. Daughter is present and supportive. Recent Psychosocial Issues: other health issues Psychiatric History: No history seems to be optimistic and feels good about his progress and ability to do well here Substance Abuse History: No issues  Patient / Family Perceptions, Expectations & Goals Pt/Family understanding of illness & functional limitations: Pt and daughter can explain his exaccerbation of his myathenic gravis. All talk with the MD and feel their questions and concerns are being addressed. Premorbid pt/family roles/activities: husband, father, retiree, neighbor, church member, etc Anticipated changes in roles/activities/participation: resume Pt/family expectations/goals: Pt states: " I hope to do well here." Daughter states: " I know he will work hard and do his best he always has."  [employer]: None Premorbid Home Care/DME Agencies: None Transportation available at discharge: family pt did not drive PTA Is the patient able to respond to transportation needs?: Yes In the past 12 months,  has lack of transportation kept you from medical appointments or  from getting medications?: No In the past 12 months, has lack of transportation kept you from meetings, work, or from getting things needed for daily living?: No  Discharge Planning Living Arrangements: Spouse/significant other Support Systems: Spouse/significant other, Children, Friends/neighbors, Church/faith community Type of Residence: Private residence Insurance Resources: [employer] Financial Resources: Social Security, Family Support Financial Screen Referred: No Living Expenses: Own Money Management: Patient, Spouse Does the patient have any problems obtaining your medications?: No Home Management: wife Patient/Family Preliminary Plans: Return home with wife who will be his primary caregiver. Pt hopes to be mod/i by the time he goes home from here. His two children will assist also when not working. Aware 7-10 day ELOS here and does well with moving main issue is his swallowing Care Coordinator Anticipated Follow Up Needs: HH/OP  Clinical Impression Pleasant gentleman of little words, his daughter is here and answers the questions this worker asks. Both hope he will do well and get stronger and be able to increase his oral intake. Does well physically goals of independent with device and aware team conference tomorrow.   Lucy Chris 07/10/2022, 11:21 AM

## 2022-07-10 NOTE — Progress Notes (Signed)
Inpatient Rehabilitation Center Individual Statement of Services  Patient Name:  [name]  Date:  07/10/2022  Welcome to the Inpatient Rehabilitation Center.  Our goal is to provide you with an individualized program based on your diagnosis and situation, designed to meet your specific needs.  With this comprehensive rehabilitation program, you will be expected to participate in at least 3 hours of rehabilitation therapies Monday-Friday, with modified therapy programming on the weekends.  Your rehabilitation program will include the following services:  Physical Therapy (PT), Occupational Therapy (OT), Speech Therapy (ST), 24 hour per day rehabilitation nursing, Therapeutic Recreaction (TR), Care Coordinator, Rehabilitation Medicine, Nutrition Services, and Pharmacy Services  Weekly team conferences will be held on Tuesday to discuss your progress.  Your Inpatient Rehabilitation Care Coordinator will talk with you frequently to get your input and to update you on team discussions.  Team conferences with you and your family in attendance may also be held.  Expected length of stay: 7-10 days  Overall anticipated outcome: Independent with device  Depending on your progress and recovery, your program may change. Your Inpatient Rehabilitation Care Coordinator will coordinate services and will keep you informed of any changes. Your Inpatient Rehabilitation Care Coordinator's name and contact numbers are listed  below.  The following services may also be recommended but are not provided by the Inpatient Rehabilitation Center:   Home Health Rehabiltiation Services Outpatient Rehabilitation Services   Arrangements will be made to provide these services after discharge if needed.  Arrangements include referral to agencies that provide these services.  Your insurance has been verified to be:  medicare A & B Your primary doctor is:  Betty [place]  Pertinent information will be shared with your  doctor and your insurance company.  Inpatient Rehabilitation Care Coordinator:  Dossie Der, [place] [phone] or Luna [place]  Information discussed with and copy given to patient by: Lucy Chris, 07/10/2022, 9:42 AM

## 2022-07-10 NOTE — Progress Notes (Signed)
NIF: -40 with good pt effort. VC unavailable

## 2022-07-11 DIAGNOSIS — R000 Tachycardia, unspecified: Secondary | ICD-10-CM

## 2022-07-11 LAB — GLUCOSE, CAPILLARY
Glucose-Capillary: 143 mg/dL — ABNORMAL HIGH (ref 70–99)
Glucose-Capillary: 234 mg/dL — ABNORMAL HIGH (ref 70–99)
Glucose-Capillary: 178 mg/dL — ABNORMAL HIGH (ref 70–99)
Glucose-Capillary: 207 mg/dL — ABNORMAL HIGH (ref 70–99)

## 2022-07-11 MED ORDER — METFORMIN HCL 500 MG PO TABS
500.0000 mg | ORAL_TABLET | Freq: Two times a day (BID) | ORAL | Status: DC
  Administered 2022-07-11 – 2022-07-18 (×14): 500 mg via ORAL
  Filled 2022-07-11 (×14): qty 1

## 2022-07-11 NOTE — Progress Notes (Signed)
Occupational Therapy Session Note  Patient Details  Name: [name] MRN: [id number] Date of Birth: [birth year]  Today's Date: 07/11/2022 OT Individual Time: 4010-2725 OT Individual Time Calculation (min): 57 min   Today's Date: 07/11/2022 OT Individual Time: 3664-4034 OT Individual Time Calculation (min): 38 min   Short Term Goals: Week 1:  OT Short Term Goal 1 (Week 1): STGs=LTGS due to patient's length of stay.  Skilled Therapeutic Interventions/Progress Updates:   Session 1: Pt received resting in bed for skilled OT session with focus on BADL participation. Pt agreeable to interventions, demonstrating overall pleasant mood. Pt with no reports of pain, present with much improved/alert affect from previous session with this therapist. OT offering intermediate rest breaks and positioning suggestions throughout session to address potential pain/fatigue and maximize participation/safety in session.   Pt ambulates into/out of bathroom with close supervision + RW, performing all functional transfers with the same level of assistance. Pt completes full-shower with overall supervision + use of grab bar for standing balance to perform peri-cleaning. Pt dresses at EOB, requiring no more than setup A for UB, pt returns to supine prior to dressing LB. Pt's vitals taken, noted below: BP=101/63 HR=88 O2=100  Pt dependent for donning TEDs, close supervision for LB management provided. Pt with one instance of posterior LOB while threading pants, corrected with CGA. Pt then returns to bed, expressing increased fatigue.   Pt remained resting in bed with all immediate needs met at end of session. Pt continues to be appropriate for skilled OT intervention to promote further functional independence.   Session 2: Pt received resting in bed for skilled OT session with focus on toileting, functional mobility/cognition, and activity tolerance. Pt agreeable to interventions, demonstrating overall pleasant  mood. Pt with no reports of pain, stating "I don't know why I'm so tired." OT offering intermediate rest breaks and positioning suggestions throughout session to address pain/fatigue and maximize participation/safety in session.   Pt requires increased time for motivation to participate in session due to increased fatigue. Pt performs all functional transfers with supervision + rollator. Pt with urgency, performing 3/3 toileting with lateral leans and supervision + rollator. Pt ambulates from room<>ortho gym. In ortho gym, pt tasked with completing the Trail Making Part 1/2 assessments on the BITS modality. Pt completes Part 1 within ~1.46 mins, making 4 four mistakes, patient is unable to complete Part 2, reaching the number 7 before stating "I give up. . . I got confused." Pt tolerated ~ 5 mins of standing at that point in the session, vitals measured: BP=110/70 HR=118 O2=99  Pt wearing TEDs throughout session, dependent for management. Pt returns to bed with supervision.  Pt remained resting in bed with all immediate needs met at end of session. Pt continues to be appropriate for skilled OT intervention to promote further functional independence.   Therapy Documentation Precautions:  Precautions Precautions: Fall, Other (comment) Precaution Comments: Watch BP and RR; Aspiration Restrictions Weight Bearing Restrictions: No   Therapy/Group: Individual Therapy  Lou Cal, OTR/L, MSOT  07/11/2022, 6:12 AM

## 2022-07-11 NOTE — Progress Notes (Signed)
Speech Language Pathology Daily Session Note  Patient Details  Name: [name] MRN: [id number] Date of Birth: [birth year]  Today's Date: 07/11/2022 SLP Individual Time: 1000-1100 SLP Individual Time Calculation (min): 60 min  Short Term Goals: Week 1: SLP Short Term Goal 1 (Week 1): STGs=LTGs due to ELOS  Skilled Therapeutic Interventions:  Pt was seen in am to address cognitive re- training, speech intelligibility, and dysphagia management. Pt with MD upon SLP arrival who reported pt concern regarding swallowing this date. After discussion with MD, pt was assisted to [facility] for remainder of session. Pt was presented with D3 trials. Instruction to pt for small single boluses, slow rate, and frequent liquid washes. Pt presented with mild to moderately prolonged mastication, decreased bolus formation and piece meal swallow. Oral phase efficiency improved with decreased distractions and only mildly prolonged mastication. Pt with no oral residue or bolus pocketing. He consumed thin liquids intermittently throughout snack with no overt s/sx pen/asp. In additional minutes of session, SLP administered SLUMS. Pt scored a 24/30 indicative of a mild neurocognitive impairment. Pt presented with strengths in orientation, language, and immediate recall. Deficits noted in executive function, mental manipulation, and delayed recall. In final minutes of session, SLP challenged pt in speech intelligibility. Pt presented with improved articulation of consonants however continued to present with decreased intensity. Given reduced distractions, pt ~ 80% intelligible with cues for amplifying and breath support. Pt's friend entering at conclusion of session. SLP assisted pt in transfer back to bed where call button was placed within reach, bed alarm activated, and friend present. SLP to continue POC.  Pain Pain Assessment Pain Scale: 0-10 Pain Score: 0-No pain  Therapy/Group: Individual Therapy  Renaee Munda 07/11/2022, 2:28 PM

## 2022-07-11 NOTE — Progress Notes (Signed)
PROGRESS NOTE   Subjective/Complaints:  Pt reports his swallow felt a little more difficult today. Discussed with SLP who is in room to start a session. No additional concerns or complaints this AM.    ROS: Denies N/V, abdominal pain, constipation, diarrhea, SOB, cough, chest pain, HA new weakness or paraesthesias.    Objective:   No results found. Recent Labs    07/09/22 1036 07/10/22 0603  WBC  --  7.5  HGB 7.4* 7.8*  HCT 22.8* 24.4*  PLT  --  422*    No results for input(s): "NA", "K", "CL", "CO2", "GLUCOSE", "BUN", "CREATININE", "CALCIUM" in the last 72 hours.   Intake/Output Summary (Last 24 hours) at 07/11/2022 1014 Last data filed at 07/10/2022 2110 Gross per 24 hour  Intake 597 ml  Output 625 ml  Net -28 ml         Physical Exam: Vital Signs Blood pressure 110/70, pulse (!) 101, temperature 97.9 F (36.6 C), temperature source Oral, resp. rate 16, height 5\' 5"  (1.651 m), weight 52.9 kg, SpO2 99 %.    Constitutional: No apparent distress.  Laying in bed. Appears comfortable HENT: No JVD. Neck Supple. Trachea midline. Atraumatic, normocephalic. +Cortrak Cardiovascular: RRR Respiratory: CTAB. No rales, rhonchi, or wheezing. On RA.  Abdomen: + bowel sounds, normoactive. No distention or tenderness.  GU: Not examined. + condom cath, draining clear urine.  Skin: Warm and dry, ecchymotic area LLQ and multiple small bruises from heparin injection.    MSK:      No apparent deformity.                RUE: 4/5 throughout                LUE: 5-/5 throughout-improved                RLE: 5-/5 throughout-improved                LLE:  5-/5 throughout-improved   Neurologic exam:  Cognition: AAO to person, place, time.  No apparent cognitive deficits Insight: Good insight into current condition.  Mood: Pleasant.  Appropriate affect.  Sensation: To light touch intact in BL UEs and LEs  Reflexes: 1+ in BL UE and  LEs. Negative Hoffman's and babinski signs bilaterally.  CN: + R ptosis. + mild R facial droop-unchanged Coordination: Bilateral upper extremity tremors with antigravity range of motion  spasticity: MAS 0 in all extremities.     Assessment/Plan: 1. Functional deficits which require 3+ hours per day of interdisciplinary therapy in a comprehensive inpatient rehab setting. Physiatrist is providing close team supervision and 24 hour management of active medical problems listed below. Physiatrist and rehab team continue to assess barriers to discharge/monitor patient progress toward functional and medical goals  Care Tool:  Bathing    Body parts bathed by patient: Right arm, Left arm, Chest, Abdomen, Front perineal area, Buttocks, Right upper leg, Left upper leg, Right lower leg, Left lower leg, Face         Bathing assist Assist Level: Contact Guard/Touching assist     Upper Body Dressing/Undressing Upper body dressing   What is the patient wearing?: Pull over shirt  Upper body assist Assist Level: Set up assist    Lower Body Dressing/Undressing Lower body dressing      What is the patient wearing?: Pants     Lower body assist Assist for lower body dressing: Minimal Assistance - Patient > 75%     Toileting Toileting Toileting Activity did not occur (Clothing management and hygiene only): N/A (no void or bm)  Toileting assist Assist for toileting: Contact Guard/Touching assist     Transfers Chair/bed transfer  Transfers assist     Chair/bed transfer assist level: Minimal Assistance - Patient > 75%     Locomotion Ambulation   Ambulation assist      Assist level: Minimal Assistance - Patient > 75% Assistive device: Walker-rolling Max distance: 50   Walk 10 feet activity   Assist     Assist level: Minimal Assistance - Patient > 75% Assistive device: Walker-rolling   Walk 50 feet activity   Assist    Assist level: Minimal Assistance - Patient >  75% Assistive device: Walker-rolling    Walk 150 feet activity   Assist Walk 150 feet activity did not occur: Safety/medical concerns (2/2 fatigue)         Walk 10 feet on uneven surface  activity   Assist     Assist level: Minimal Assistance - Patient > 75% Assistive device: Walker-rolling   Wheelchair     Assist Is the patient using a wheelchair?: No   Wheelchair activity did not occur: N/A         Wheelchair 50 feet with 2 turns activity    Assist    Wheelchair 50 feet with 2 turns activity did not occur: N/A       Wheelchair 150 feet activity     Assist  Wheelchair 150 feet activity did not occur: N/A       Blood pressure 110/70, pulse (!) 101, temperature 97.9 F (36.6 C), temperature source Oral, resp. rate 16, height 5\' 5"  (1.651 m), weight 52.9 kg, SpO2 99 %.  Medical Problem List and Plan: 1. Functional deficits secondary to myasthenia gravis flare             -patient may shower             -ELOS/Goals: 18-21 days, Min A PT/OT/SLP             -Appreciate hospitalist discharge notation: Medications that may worsen or trigger MG exacerbation: Class IA antiarrhythmics, magnesium, flouroquinolones, macrolides, aminoglycosides, penicillamine, curare, interferon alpha, botox, quinine. Use with caution: calcium channel blockers, beta blockers and statins.   -Team conference today please see physician documentation under team conference tab, met with team  to discuss problems,progress, and goals. Formulized individual treatment plan based on medical history, underlying problem and comorbidities.      2.  Antithrombotics: -DVT/anticoagulation:  Pharmaceutical: Lovenox-> SCDs 6-9, see below             -antiplatelet therapy: N/A   3. Pain Management: Tylenol prn.    4. Mood/Behavior/Sleep: Uses Ambien 10 mg as needed at home. LCSW to follow for evaluation and support. Team to provide ego support.              -antipsychotic agents: N/A              --Melatonin prn for insomnia.    5. Neuropsych/cognition: This patient is capable of making decisions on his own behalf. 6. Skin/Wound Care: Routine pressure relief measures 7. Fluids/Electrolytes/Nutrition/dysphagia: Monitor I/O. Change tube feed  to 60  cc/hr X 10 hours w/H2O qid.  --start Calorie count tomorrow. Wife encouraged to bring soft foods from home.               --downgrade diet to D2--reports feeling food sticking in throat/throat discomfort from intubations             --MMW added.   -6/9: He is gradually improving, however remain inadequate.  Wait for nutrition This week, should be able to remove core track of p.o.'s improvement next few days.  Added Cepacol lozenge for throat discomfort.  -6/10 appears to be eating more, consider remove cor track in new few days  6/11 50-65 % of meals eaten, pt to work with SLP today, continue cor track now now given pt reports of some concerns about swallow this AM, SLP to eval this AM   8. MG flare w/ respiratory failure: Has completed course of IVIG.  --Continue to monitor NIF/VC--> NIF -40 VC-1.2 L this am.              --Continue home regimen of Mestinon and Prednisone.    9. Anxiety: Continue Klonopin, Paxil and prn Xanax. Continue to provide ego support.  --Patient frustrated/expressing disappointment that he has not rebounded as in the past. Continue to encourage/provide support.  -- Would benefit from neuropsych evaluation --6-8: Encouraged family regarding patient's low energy, attributable to current MG flare in addition to medications required for mood control.  Will add BuSpar 5 mg twice daily, wean Klonopin to 0.5 mg twice daily if tolerated. -6/9: Anxiety improving, has not required as needed Xanax the last 2 days.  Monitor tonight, consider DC scheduled Klonopin in the a.m. if he remains stable 6/10 one use of xanax last night, overall anxiety improved 6/11 no use of xanax in last 24h, improved overall    10. T2DM: Hgb  A1c--5.7 and well controlled. Will monitor BS ac/hs and use SSI for elevated BS             --continue to hold Metformin and Januvia (discussed need to increase intake w/patient)  -- 6-8: Elevated, eating 25 to 50% of meals.  Continue monitoring, consider addition of Semglee or resuming metformin when appropriate.  -6/11 restart metformin 500mg  BID (home dose 1000mg  BID)  Recent Labs    07/10/22 1640 07/10/22 2055 07/11/22 0603  GLUCAP 223* 250* 143*        11. Hep B: On Descovy as substitute-->wife to bring Entecavir from home.    12. Hypotension/Tachycardia: Ongoing for weeks. Inpatient workup including CTA chest and lower extremity venous duplex negative  - Monitor for any signs of decline in his NIF or vital capacity, if any worsening discontinue Cardizem.   -NIF -40 6/11, cont to monitor  -BP/HR appear stable overall    07/11/2022    6:09 AM 07/11/2022    4:27 AM 07/10/2022    7:38 PM  Vitals with BMI  Weight 116 lbs 10 oz    BMI 19.41    Systolic  110 113  Diastolic  70 65  Pulse  101 104    13. Anemia - HgB 7.9 -> 7.3 -> 7.0 6/8. Stool card pending. Recheck h/h 6/9, hold lovenox, start SCDs. if below 7 will transfuse.  6/9: H&H 7.4; monitor with a.m. labs.  Only obvious cause at this time is Lovenox so we will continue to hold with SCDs; mobility is improving  6/10 HGB up to 7.8, continue to monitor    LOS:  4 days A FACE TO FACE EVALUATION WAS PERFORMED  Fanny Dance 07/11/2022, 10:14 AM

## 2022-07-11 NOTE — Progress Notes (Signed)
NIF -20 FVC : 1.07  With good pt effort

## 2022-07-11 NOTE — Progress Notes (Signed)
Physical Therapy Session Note  Patient Details  Name: [name] MRN: [id number] Date of Birth: Jan 17, 1956  Today's Date: 07/11/2022 PT Individual Time: 1300-1356 PT Individual Time Calculation (min): 56 min  and Today's Date: 07/11/2022 PT Missed Time: 19 Minutes Missed Time Reason: Patient fatigue  Short Term Goals: Week 1:  PT Short Term Goal 1 (Week 1): STG=LTG 2/2 LOS  Skilled Therapeutic Interventions/Progress Updates:      Therapy Documentation Precautions:  Precautions Precautions: Fall, Other (comment) Precaution Comments: Watch BP and RR; Aspiration Restrictions Weight Bearing Restrictions: No  Pt received semi-reclined in bed, agreeable to PT and declines pain. Pt largely limited due to fatigue in session and required frequent extended rest breaks. Pt (S) with supine to sit with use of bed features and requires min left HHA for gait ~120 ft to main gym. Pt provided with seated rest break and therapist trialed different AD's for gait training. Pt requires mod A for gait ~10 ft with SPC with max mutlimodal cues for sequencing therefore pt transitioned to gait training ~185 ft with rollator. Pt educated on safety and how to unlock/lock brakes with AD. Pt performed Berg balance assessment. Patient demonstrates increased fall risk as noted by score of  16 /56 on Berg Balance Scale.  (<36= high risk for falls, close to 100%; 37-45 significant >80%; 46-51 moderate >50%; 52-55 lower >25%). Pt unable to complete assessment secondary to fatigue and PT retrieved pt's w/c and dependently transported pt to room for time management and energy conservation. Pt min A with stand pivot to bed and supervision with sit to lying. Pt min A for dressing as he requested to switch into hospital gown. Pt missed total of 19 minutes of skilled PT due to fatigue.     07/11/22 1332  Standardized Balance Assessment  Standardized Balance Assessment Berg Balance Test  Berg Balance Test  Sit to Stand 4   Standing Unsupported 2  Sitting with Back Unsupported but Feet Supported on Floor or Stool 4  Stand to Sit 3  Transfers 3  Standing Unsupported with Eyes Closed 0  Standing Ubsupported with Feet Together 0  From Standing, Reach Forward with Outstretched Arm 0  From Standing Position, Pick up Object from Floor 0  From Standing Position, Turn to Look Behind Over each Shoulder 0  Turn 360 Degrees 0  Standing Unsupported, Alternately Place Feet on Step/Stool 0  Standing Unsupported, One Foot in Front 0  Standing on One Leg 0  Total Score 16      Therapy/Group: Individual Therapy  Truitt Leep Truitt Leep PT, DPT  07/11/2022, 7:29 AM

## 2022-07-11 NOTE — Progress Notes (Signed)
NIF -40 VC attempted but device didn't work.   Pt performed with great effort.

## 2022-07-11 NOTE — Progress Notes (Addendum)
Patient ID: [name], male   DOB: October 05, 1955, 67 y.o.   MRN: [id number]  This SW covering for primary SW Becky Dupree.  1616-SW left message for pt wife to provide update on d/c date 6/17 and encouraged follow-up to provide more details.  *SW went by pt room to provide updates but pt sleeping and no family in room.   1707-SW returned phone call to pt wife Sopheap to discuss above. Family edu scheduled for Thursday 9am-12pm. She asked if someone could call her son. SW shared will follow-up.   1716- SW left message for pt son Thayer Ohm detailing progress made in rehab with d/c date and informing on family edu. SW encouraged him to follow-up with primary SW tomorrow if questions.   Cecile Sheerer, MSW, LCSWA Office: [phone] Cell: [phone] Fax: [phone]

## 2022-07-11 NOTE — Progress Notes (Signed)
Calorie Count Note  48 hour calorie count ordered. Day 1   Diet: Dys 1 Supplements: Ensure BID  RD collected calorie count tickets. However, item line percentages not noted. Per record, pt has eaten 65-100% of his meals over the past 24 hours. Pt is likely meeting his needs. MD would like to monitor for one more day due to pt report of issues with swallowing this am.   RD will continue to monitor PO intakes and POC.   Bethann Humble, RD, LDN, CNSC.

## 2022-07-11 NOTE — Patient Care Conference (Signed)
Inpatient RehabilitationTeam Conference and Plan of Care Update Date: 07/11/2022   Time: 11:31 AM    Patient Name: [name]      Medical Record Number: [id number]  Date of Birth: 08-Dec-1955 Sex: Male         Room/Bed: 4M02C/4M02C-01 Payor Info: Payor: MEDICARE / Plan: MEDICARE PART A AND B / Product Type: *No Product type* /    Admit Date/Time:  07/07/2022  2:51 PM  Primary Diagnosis:  Myasthenia exacerbation [facility])  Hospital Problems: Principal Problem:   Myasthenia exacerbation [facility])    Expected Discharge Date: Expected Discharge Date: 07/17/22  Team Members Present: Physician leading conference: Dr. Fanny Dance Social Worker Present: Cecile Sheerer, LCSWA Nurse Present: Vedia Pereyra, RN PT Present: Truitt Leep, PT OT Present: Lou Cal, OT SLP Present: Feliberto Gottron, SLP     Current Status/Progress Goal Weekly Team Focus  Bowel/Bladder   pt continent of b/b. LBM 6/10   Remain continent   Assist with tolieting qshift and prn    Swallow/Nutrition/ Hydration   Dys 1 textures with thin liquids with full supervision . PO intake is limited by lethargy and poor endurance   supervision  ongoing education with family and patient regarding fatigue and impact on swallowing safety, trials of upgraded textures    ADL's   CGA-Min A for all ADLS and functional transfers   Mod I   Activity tolerance with focus on hemodynamic stability    Mobility   CGA - MinA overal + RW   at least modI with transfers and locomotion, may get to no AD, TBD  dynamic standing balance and AD selection    Communication   50- 60% intelligible with decreased vocal intensity and imprecise consonants   supervision   implementation and utilization of speech intelligibility strategies at the phrase and sentence level    Safety/Cognition/ Behavioral Observations  Decreased endurance resulting in poor arousal and attention limiting participation   supervision   improved  alertness for ongoing diagnostic treatment of cognitive functioning    Pain   no c/o pain   Remain pain free   Assess qshift and prn    Skin   Skin intact   Maintain skin integrity  Assess qshift and prn      Discharge Planning:  Home with wife who will be his primary caregiver and two children are involved also. Pt hopeful to do well here and regain his independece and increase his oral intake   Team Discussion: Myasthenia exacerbation. Limited PO intake d/t lethargy and poor endurance. D1/thin. Tube feeding via Cortrak from 6p-4a. ACHS. Daily weight. Patient reported increase in difficulty to swallow. Hypotension noted with therapy.  Patient on target to meet rehab goals: Working towards goals by discharge of 07/17/22  *See Care Plan and progress notes for long and short-term goals.   Revisions to Treatment Plan:  Monitor sleep/wake cycles. Restarted Metformin. Calorie ct continued. Monitor labs/VS Teaching Needs: Medications, safety, self care, gait/transfer training, etc   Current Barriers to Discharge: Decreased caregiver support and Nutritional means  Possible Resolutions to Barriers: Family education, adequate nutritional intake, order recommended DME     Medical Summary Current Status: myesthemia gravis, DM2, Dysphagia,  anxiety, tachycardia, hypotension  Barriers to Discharge: Hypotension;Medical stability;Inadequate Nutritional Intake  Barriers to Discharge Comments: myesthemia gravis, DM2, Dysphagia, anxiety, tachycardia, hypotension, poor endurance Possible Resolutions to [employer] Focus: consider DC cortrak, metformin started, monitor labs   Continued Need for Acute Rehabilitation Level of Care: The patient requires daily medical management  by a physician with specialized training in physical medicine and rehabilitation for the following reasons: Direction of a multidisciplinary physical rehabilitation program to maximize functional independence :  Yes Medical management of patient stability for increased activity during participation in an intensive rehabilitation regime.: Yes Analysis of laboratory values and/or radiology reports with any subsequent need for medication adjustment and/or medical intervention. : Yes   I attest that I was present, lead the team conference, and concur with the assessment and plan of the team.   Jearld Adjutant 07/11/2022, 5:03 PM

## 2022-07-12 LAB — GLUCOSE, CAPILLARY
Glucose-Capillary: 116 mg/dL — ABNORMAL HIGH (ref 70–99)
Glucose-Capillary: 185 mg/dL — ABNORMAL HIGH (ref 70–99)
Glucose-Capillary: 123 mg/dL — ABNORMAL HIGH (ref 70–99)
Glucose-Capillary: 91 mg/dL (ref 70–99)

## 2022-07-12 MED ORDER — DILTIAZEM HCL 30 MG PO TABS
60.0000 mg | ORAL_TABLET | Freq: Three times a day (TID) | ORAL | Status: DC
  Administered 2022-07-12 – 2022-07-17 (×14): 60 mg via ORAL
  Filled 2022-07-12 (×14): qty 2

## 2022-07-12 MED ORDER — PREDNISONE 10 MG PO TABS
10.0000 mg | ORAL_TABLET | Freq: Every day | ORAL | Status: DC
  Administered 2022-07-13: 10 mg via ORAL
  Filled 2022-07-12: qty 1

## 2022-07-12 MED ORDER — PYRIDOSTIGMINE BROMIDE 60 MG PO TABS
60.0000 mg | ORAL_TABLET | Freq: Three times a day (TID) | ORAL | Status: DC
  Administered 2022-07-12 – 2022-07-18 (×17): 60 mg via ORAL
  Filled 2022-07-12 (×19): qty 1

## 2022-07-12 NOTE — Progress Notes (Signed)
Reached out to RT to repeat NIF--patient was post therapy on last reading and repeat after rest improved.

## 2022-07-12 NOTE — Progress Notes (Signed)
Occupational Therapy Session Note  Patient Details  Name: [name] MRN: [id number] Date of Birth: 06-24-1955  Today's Date: 07/12/2022 OT Individual Time: 1105-1200 OT Individual Time Calculation (min): 55 min    Short Term Goals: Week 1:  OT Short Term Goal 1 (Week 1): STGs=LTGS due to patient's length of stay.  Skilled Therapeutic Interventions/Progress Updates:  Pt received resting in bed for skilled OT session with focus on functional mobility & cognition, safe rollator management, and activity tolerance . Pt agreeable to interventions, demonstrating overall pleasant mood. Pt with no reports of pain. OT offering intermediate rest breaks and positioning suggestions throughout session to address pain/fatigue and maximize participation/safety in session.  Pt completes full-body dressing with item-retrieval at bed-level. Pt ambulates from room<>main therapy gym with close supervision + rollator. In main therapy gym, pt tasked with locating/retrieving colored bean bags scattered around gym. For dual task challenge, pt asked to retrieve bean bags in a specific order of 3 & 5. Pt requires heavy cuing to remember order, unable to recall past 2 colors without cuing. Vitals monitored throughout, noted below:  BP=114/73 HR=96 O2=100  BP=107/78 HR=103 O2=100  Pt remained resting in bed with all immediate needs met at end of session. Pt continues to be appropriate for skilled OT intervention to promote further functional independence.   Therapy Documentation Precautions:  Precautions Precautions: Fall, Other (comment) Precaution Comments: Watch BP and RR; Aspiration Restrictions Weight Bearing Restrictions: No   Therapy/Group: Individual Therapy  Lou Cal, OTR/L, MSOT  07/12/2022, 6:15 AM

## 2022-07-12 NOTE — Progress Notes (Signed)
Patient performed NIF and VC with good effort.  NIF -24  VC 2.1L

## 2022-07-12 NOTE — Progress Notes (Signed)
Speech Language Pathology Daily Session Note  Patient Details  Name: [name] MRN: [id number] Date of Birth: July 09, 1955  Today's Date: 07/12/2022 SLP Individual Time: 0800-0853 SLP Individual Time Calculation (min): 53 min  Short Term Goals: Week 1: SLP Short Term Goal 1 (Week 1): STGs=LTGs due to ELOS  Skilled Therapeutic Interventions:  Pt was seen in am to address dysphagia management and speech intelligibility. Pt was alert and seated upright at bedside upon SLP arrival. Pt's son was present and participated intermittently throughout session. Assigned RN in and out during session administering meds. Pt consumed meds whole in puree given additional time and no further impairment. SLP assisted pt to [facility] for breakfast meal. SLP provided set up A for oral hygiene with pt completing thorough oral hygiene indep. He was presented with breakfast meal consisting of Dys 1 and Dys 3 textures. He administered all trials. Dys 1 trials and thin liquids were unremarkable. SLP instructed pt in compensatory strategies of small single boluses, slow rate, and alternating liquids and solids. Pt administered all trials with good utilization of amount. He presented with mildly prolonged but functional mastication and improved bolus formation with no observed instances of piece meal swallow. Pt with no s/sx pen/asp throughout session. SLP recommended upgrade to Dys 2 diet and thin liquids with recommendations to continue advanced solid trials. Pt with improved vocal intensity this date with ~90% intelligibility in conversation. Toward end of session pt inquired about receiving his anxiety meds. Pt appeared with change of emotion to a more somber personality. SLP reviewed recent medical administration and encouraged pt to use call button to ask for assistance. Pt was responsive to SLP cues for breath support and amplifying voice to speak into call button.Pt able to communicate needs effectively. Pt was left seated  upright in WC with call button within reach and son present. SLP to continue POC.   Pain Pain Assessment Pain Scale: 0-10 Pain Score: 0-No pain  Therapy/Group: Individual Therapy  Renaee Munda 07/12/2022, 10:35 AM

## 2022-07-12 NOTE — Progress Notes (Signed)
PROGRESS NOTE   Subjective/Complaints:  Increased R eye ptosis this AM, reports this has happens intermittently on chronic basis.  Reports he feels strength overall stable.  He is eating more food PO, will remove feeding tube today.   ROS: Denies N/V, abdominal pain, constipation, diarrhea, SOB, cough, chest pain, HA new weakness or paraesthesias.   + R ptosis  Objective:   No results found. Recent Labs    07/09/22 1036 07/10/22 0603  WBC  --  7.5  HGB 7.4* 7.8*  HCT 22.8* 24.4*  PLT  --  422*    No results for input(s): "NA", "K", "CL", "CO2", "GLUCOSE", "BUN", "CREATININE", "CALCIUM" in the last 72 hours.   Intake/Output Summary (Last 24 hours) at 07/12/2022 0850 Last data filed at 07/12/2022 0021 Gross per 24 hour  Intake 476 ml  Output 300 ml  Net 176 ml         Physical Exam: Vital Signs Blood pressure 109/70, pulse 92, temperature 98.6 F (37 C), temperature source Oral, resp. rate 16, height 5\' 5"  (1.651 m), weight 52.9 kg, SpO2 99 %.    Constitutional: No apparent distress. Working in gym  HENT: No JVD. Neck Supple. Trachea midline. Atraumatic, normocephalic. +Cortrak + R eye ptosis Cardiovascular: RRR Respiratory: CTAB. No rales, rhonchi, or wheezing. On RA.  Abdomen: + bowel sounds, normoactive. No distention or tenderness.  GU: Not examined. + condom cath, draining clear urine.  Skin: Warm and dry, ecchymotic area LLQ and multiple small bruises from heparin injection.    MSK:      No apparent deformity.                RUE: 4/5 throughout                LUE: 5-/5 throughout-improved                RLE: 5-/5 throughout-improved                LLE:  5-/5 throughout-improved   Neurologic exam:  Cognition: AAO to person, place, time.  No apparent cognitive deficits Insight: Good insight into current condition.  Mood: Pleasant.  Appropriate affect.  Sensation: To light touch intact in BL UEs and  LEs  Reflexes: 1+ in BL UE and LEs. Negative Hoffman's and babinski signs bilaterally.  CN: + R ptosis-increased tdoay. + mild R facial droop-unchanged Coordination: Bilateral upper extremity tremors with antigravity range of motion  spasticity: MAS 0 in all extremities.     Assessment/Plan: 1. Functional deficits which require 3+ hours per day of interdisciplinary therapy in a comprehensive inpatient rehab setting. Physiatrist is providing close team supervision and 24 hour management of active medical problems listed below. Physiatrist and rehab team continue to assess barriers to discharge/monitor patient progress toward functional and medical goals  Care Tool:  Bathing    Body parts bathed by patient: Right arm, Left arm, Chest, Abdomen, Front perineal area, Buttocks, Right upper leg, Left upper leg, Right lower leg, Left lower leg, Face         Bathing assist Assist Level: Contact Guard/Touching assist     Upper Body Dressing/Undressing Upper body dressing  What is the patient wearing?: Pull over shirt    Upper body assist Assist Level: Set up assist    Lower Body Dressing/Undressing Lower body dressing      What is the patient wearing?: Pants     Lower body assist Assist for lower body dressing: Minimal Assistance - Patient > 75%     Toileting Toileting Toileting Activity did not occur (Clothing management and hygiene only): N/A (no void or bm)  Toileting assist Assist for toileting: Contact Guard/Touching assist     Transfers Chair/bed transfer  Transfers assist     Chair/bed transfer assist level: Minimal Assistance - Patient > 75%     Locomotion Ambulation   Ambulation assist      Assist level: Minimal Assistance - Patient > 75% Assistive device: Walker-rolling Max distance: 50   Walk 10 feet activity   Assist     Assist level: Minimal Assistance - Patient > 75% Assistive device: Walker-rolling   Walk 50 feet activity   Assist     Assist level: Minimal Assistance - Patient > 75% Assistive device: Walker-rolling    Walk 150 feet activity   Assist Walk 150 feet activity did not occur: Safety/medical concerns (2/2 fatigue)         Walk 10 feet on uneven surface  activity   Assist     Assist level: Minimal Assistance - Patient > 75% Assistive device: Walker-rolling   Wheelchair     Assist Is the patient using a wheelchair?: No   Wheelchair activity did not occur: N/A         Wheelchair 50 feet with 2 turns activity    Assist    Wheelchair 50 feet with 2 turns activity did not occur: N/A       Wheelchair 150 feet activity     Assist  Wheelchair 150 feet activity did not occur: N/A       Blood pressure 109/70, pulse 92, temperature 98.6 F (37 C), temperature source Oral, resp. rate 16, height 5\' 5"  (1.651 m), weight 52.9 kg, SpO2 99 %.  Medical Problem List and Plan: 1. Functional deficits secondary to myasthenia gravis flare             -patient may shower             -ELOS/Goals: 18-21 days, Min A PT/OT/SLP             -Appreciate hospitalist discharge notation: Medications that may worsen or trigger MG exacerbation: Class IA antiarrhythmics, magnesium, flouroquinolones, macrolides, aminoglycosides, penicillamine, curare, interferon alpha, botox, quinine. Use with caution: calcium channel blockers, beta blockers and statins.   -Est discharge 6/17     2.  Antithrombotics: -DVT/anticoagulation:  Pharmaceutical: Lovenox-> SCDs 6-9, see below             -antiplatelet therapy: N/A   3. Pain Management: Tylenol prn.    4. Mood/Behavior/Sleep: Uses Ambien 10 mg as needed at home. LCSW to follow for evaluation and support. Team to provide ego support.              -antipsychotic agents: N/A             --Melatonin prn for insomnia.    5. Neuropsych/cognition: This patient is capable of making decisions on his own behalf. 6. Skin/Wound Care: Routine pressure relief  measures 7. Fluids/Electrolytes/Nutrition/dysphagia: Monitor I/O. Change tube feed to 60  cc/hr X 10 hours w/H2O qid.  --start Calorie count tomorrow. Wife encouraged to bring soft foods  from home.               --downgrade diet to D2--reports feeling food sticking in throat/throat discomfort from intubations             --MMW added.   -6/9: He is gradually improving, however remain inadequate.  Wait for nutrition This week, should be able to remove core track of p.o.'s improvement next few days.  Added Cepacol lozenge for throat discomfort.  -6/10 appears to be eating more, consider remove cor track in new few days  6/11 50-65 % of meals eaten, pt to work with SLP today, continue cor track now now given pt reports of some concerns about swallow this AM, SLP to eval this AM  6/12 remove cor trak today   8. MG flare w/ respiratory failure: Has completed course of IVIG.  --Continue to monitor NIF/VC--> NIF -40 VC-1.2 L this am.              --Continue home regimen of Mestinon and Prednisone.   -6/12 NIF appears decreased today, will ask respiratory to recheck, if continues to be low will contract neurology    9. Anxiety: Continue Klonopin, Paxil and prn Xanax. Continue to provide ego support.  --Patient frustrated/expressing disappointment that he has not rebounded as in the past. Continue to encourage/provide support.  -- Would benefit from neuropsych evaluation --6-8: Encouraged family regarding patient's low energy, attributable to current MG flare in addition to medications required for mood control.  Will add BuSpar 5 mg twice daily, wean Klonopin to 0.5 mg twice daily if tolerated. -6/9: Anxiety improving, has not required as needed Xanax the last 2 days.  Monitor tonight, consider DC scheduled Klonopin in the a.m. if he remains stable 6/10 one use of xanax last night, overall anxiety improved 6/12 one dose xanax use, overall stable, continue to monitor    10. T2DM: Hgb A1c--5.7 and well  controlled. Will monitor BS ac/hs and use SSI for elevated BS             --continue to hold Metformin and Januvia (discussed need to increase intake w/patient)  -- 6-8: Elevated, eating 25 to 50% of meals.  Continue monitoring, consider addition of Semglee or resuming metformin when appropriate.  -6/11 restart metformin 500mg  BID (home dose 1000mg  BID)  -6/12 CBGs appears a little improved, monitor response to medication change  Recent Labs    07/11/22 1649 07/11/22 2104 07/12/22 0629  GLUCAP 178* 207* 116*        11. Hep B: On Descovy as substitute-->wife to bring Entecavir from home.    12. Hypotension/Tachycardia: Ongoing for weeks. Inpatient workup including CTA chest and lower extremity venous duplex negative  - Monitor for any signs of decline in his NIF or vital capacity, if any worsening discontinue Cardizem.   -BP/HR stable overall 6/12     07/12/2022    3:56 AM 07/11/2022    7:54 PM 07/11/2022    2:23 PM  Vitals with BMI  Systolic 109 114 161  Diastolic 70 70 72  Pulse 92 99 107    13. Anemia - HgB 7.9 -> 7.3 -> 7.0 6/8. Stool card pending. Recheck h/h 6/9, hold lovenox, start SCDs. if below 7 will transfuse.  6/9: H&H 7.4; monitor with a.m. labs.  Only obvious cause at this time is Lovenox so we will continue to hold with SCDs; mobility is improving  6/10 HGB up to 7.8, continue to monitor    LOS: 5  days A FACE TO FACE EVALUATION WAS PERFORMED  Fanny Dance 07/12/2022, 8:50 AM

## 2022-07-12 NOTE — Progress Notes (Signed)
NIF = -15 cmH20 VC = 2.6 liters  Good effort by patient.

## 2022-07-12 NOTE — Progress Notes (Signed)
Physical Therapy Session Note  Patient Details  Name: [name] MRN: [id number] Date of Birth: 1955-04-25  Today's Date: 07/12/2022 PT Individual Time: 6578-4696, 2952-8413  PT Individual Time Calculation (min): 55 min , 41 min   Short Term Goals: Week 1:  PT Short Term Goal 1 (Week 1): STG=LTG 2/2 LOS  Skilled Therapeutic Interventions/Progress Updates:      Therapy Documentation Precautions:  Precautions Precautions: Fall, Other (comment) Precaution Comments: Watch BP and RR; Aspiration Restrictions Weight Bearing Restrictions: No  Treatment Session 1:   Pt received seated in w/c at bedside with nurse present for anxiety medication administration as pt reports racing heart rate and thoughts. Pt without reports of pain in session.   Pt agreeable to continue PT session and dependently transported by w/c for time management and energy conservation to ortho gym.   Pt propelled Nu Step 2 x 5 minutes at level 2 to address LE strength and activity tolerance deficits, pulse 100-102 with activity.   Pt transitioned to boxing and performed contralateral punches to alternating targets 4 x 30 seconds with 30 second rest in between each bout to address activity tolerance deficits.   Pt transported to room and left semi-reclined in bed with all needs in reach and alarm on.    Treatment Session 2:   Pt received semi-reclined in bed, agreeable to PT and without reports of pain in session.   Pt largely requires CGA and intermittent min A for balance with gait ~75 ft, no AD to main gym.   Pt particpated in circuit training to address strength, balance, and coordination deficits consisting of x 3:    -standing contralateral punches x 1 min (Supervision)  -standing marches with bilateral 1# ankle weights x 8 (min A)  -squats x 5 with 1 kg weighted ball and ankle weights x 5 (min A)   Pt ambulated CGA/min A to room, no AD and left semi-reclined in bed with all needs in reach and alarm  on.   Therapy/Group: Individual Therapy  Truitt Leep Truitt Leep PT, DPT  07/12/2022, 7:29 AM

## 2022-07-12 NOTE — Progress Notes (Signed)
NIF = -40 cmH20    once repeated.    Better effort and understanding by patient this time.

## 2022-07-12 NOTE — Progress Notes (Signed)
Nutrition Follow-up  DOCUMENTATION CODES:   Severe malnutrition in context of acute illness/injury, Underweight  INTERVENTION:  - Continue Ensure Enlive po BID, each supplement provides 350 kcal and 20 grams of protein.  NUTRITION DIAGNOSIS:   Severe Malnutrition related to acute illness as evidenced by moderate fat depletion, moderate muscle depletion, percent weight loss.  GOAL:   Patient will meet greater than or equal to 90% of their needs - Improving.   MONITOR:   PO intake, Labs, I & O's, Weight trends, Diet advancement, Supplement acceptance, TF tolerance, Skin  REASON FOR ASSESSMENT:   Consult Assessment of nutrition requirement/status  ASSESSMENT:   67 y.o. male with PMHx including MG, T2DM, anxiety, and HTN who is now admitted to CIR as he continues to recover from MG flare  Meds reviewed: Vit D3, sliding scale insulin, metformin, MVI, prednisone. Labs reviewed. POC BG 143-234 mg/dL.   The pt had cortrak in place this am. RD reached out to MD about discontinuing cortrak and calorie count. Cortrak has been removed and tube feed orders discontinued. Pt's diet got advanced to Dys 2 diet today. Pt ate 70% of his breakfast this am. The pt has eaten 50-100% of his meals over the past 3 days. The pt is meeting his needs at this time. RD will continue to monitor PO intakes.   Diet Order:   Diet Order             DIET DYS 2 Room service appropriate? Yes; Fluid consistency: Thin  Diet effective now                   EDUCATION NEEDS:   Education needs have been addressed  Skin:  Skin Assessment: Reviewed RN Assessment  Last BM:  6/12 - type 5  Height:   Ht Readings from Last 1 Encounters:  07/07/22 5\' 5"  (1.651 m)    Weight:   Wt Readings from Last 1 Encounters:  07/12/22 53.1 kg    Ideal Body Weight:     BMI:  Body mass index is 19.48 kg/m.  Estimated Nutritional Needs:   Kcal:  1500-1700  Protein:  80-100 g  Fluid:  > 1.7 L  Bethann Humble, RD, LDN, CNSC.

## 2022-07-13 LAB — GLUCOSE, CAPILLARY
Glucose-Capillary: 101 mg/dL — ABNORMAL HIGH (ref 70–99)
Glucose-Capillary: 205 mg/dL — ABNORMAL HIGH (ref 70–99)
Glucose-Capillary: 207 mg/dL — ABNORMAL HIGH (ref 70–99)
Glucose-Capillary: 138 mg/dL — ABNORMAL HIGH (ref 70–99)

## 2022-07-13 NOTE — Progress Notes (Signed)
Patient performed NIF & VC with great effort NIF -25 VC 1.8L

## 2022-07-13 NOTE — Progress Notes (Signed)
Pt Vital Capacity and Negative insp. Force as follows.  NIF- >-40 cmH2O VC- 2.68L Pt perfomed with great effort.

## 2022-07-13 NOTE — Progress Notes (Signed)
Physical Therapy Session Note  Patient Details  Name: [name] MRN: [id number] Date of Birth: [birth year]  Today's Date: 07/13/2022 PT Individual Time: 4098-1191, 1415- 1510  PT Individual Time Calculation (min): 50 min , 55 min   Short Term Goals: Week 1:  PT Short Term Goal 1 (Week 1): STG=LTG 2/2 LOS  Skilled Therapeutic Interventions/Progress Updates:      Therapy Documentation Precautions:  Precautions Precautions: Fall, Other (comment) Precaution Comments: Watch BP and RR; Aspiration Restrictions Weight Bearing Restrictions: No  Treatment Session 1:   Pt received semi-reclined in bed with spouse present for family education. PT informed pt and spouse regarding DME recommendation of rollator and transport chair. Pt requires supervision for bed mobility, transfers including car, and gait x 150 ft with rollator. Pt requires min HHA with navigation of single 6 inch step with and pt's spouse performed mobility with patient throughout session and demonstrates competency with providing necessary supervision. Pt left semi-reclined in bed with all needs in reach and alarm on. Pt without reports of pain in session.    Treatment Session 2:   Pt received semi-reclined in bed sleeping and agreeable to PT session reporting he wants to get out of bed. Pt without reports of pain in session and requires (S) for bed mobility, STS with rollator and gait ~150 ft to main gym with rollator.   Pt performed following exercises to address UE strength and activity tolerance:   -chest press with tidal tank 3 x 10   - shoulder flexion with tidal tank 3 x 10   -CHOPS with 3.3# bilaterally 3 x 5   Pt requires close (S) for reactive dynamic standing balance while tossing and catching ball from trampoline rebounder ~1 minute.   Pt requires (S) for safety for dynamic gait while performing dual tasking (gait and dribbling basketball) ~30 ft without an AD.   Pt ambulated to room and left  semi-reclined in bed with all needs in reach and alarm on.     Therapy/Group: Individual Therapy  Truitt Leep Truitt Leep PT, DPT  07/13/2022, 7:27 AM

## 2022-07-13 NOTE — Consult Note (Signed)
Neurology Consultation  Reason for Consult: Worsening weakness in patient with myasthenia gravis Referring Physician: Dr. Berline Chough  CC: None  History is obtained from: Patient and chart  HPI: [name] is a 67 y.o. male with history of diabetes, hepatitis B, hypertension, anxiety, malignant thymoma status postresection, myasthenia gravis with recent flare who was admitted to rehabilitation on 6/7 after hospital stay for myasthenia gravis flare in which he was intubated and received treatment with IVIG.  Patient reports that he has no difficulties breathing or speaking, that he has some difficulty swallowing due to sore throat and that he feels fatigued after working with physical therapy for a long time but that he does not feel fatigued if he paces his activities.   ROS: A complete ROS was performed and is negative except as noted in the HPI.   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 No pertinent family history.   Social History:   reports that he has never smoked. He has never used smokeless tobacco. He reports that he does not drink alcohol and does not use drugs.  Medications  Current Facility-Administered Medications:    acetaminophen (TYLENOL) tablet 325-650 mg, 325-650 mg, Oral, Q4H PRN, Love, Pamela S, PA-C, 650 mg at 07/10/22 1337   ALPRAZolam (XANAX) tablet 0.5 mg, 0.5 mg, Oral, TID PRN, Love, Pamela S, PA-C, 0.5 mg at 07/12/22  2128   alum & mag hydroxide-simeth (MAALOX/MYLANTA) 200-200-20 MG/5ML suspension 30 mL, 30 mL, Oral, Q4H PRN, Love, Pamela S, PA-C   bisacodyl (DULCOLAX) suppository 10 mg, 10 mg, Rectal, Daily PRN, Love, Pamela S, PA-C   busPIRone (BUSPAR) tablet 5 mg, 5 mg, Oral, BID, Elijah Birk C, DO, 5 mg at 07/13/22 1610   camphor-menthol (SARNA) lotion, , Topical, PRN, Love, Pamela S, PA-C, Given at 07/07/22 1827   cholecalciferol (VITAMIN D3) 25 MCG (1000 UNIT) tablet 2,000 Units, 2,000 Units, Oral, Daily, Love, Pamela S, PA-C, 2,000 Units at 07/13/22 0844   clonazePAM (KLONOPIN) tablet 0.5 mg, 0.5 mg, Oral, BID, Elijah Birk C, DO, 0.5 mg at 07/13/22 0844   diltiazem (CARDIZEM) tablet 60 mg, 60 mg, Oral, Q8H, Lovorn, Megan, MD, 60 mg at 07/13/22 1511   diphenhydrAMINE (BENADRYL) capsule 25 mg, 25 mg, Oral, Q6H PRN, Love, Pamela S, PA-C   emtricitabine-tenofovir AF (DESCOVY) 200-25 MG per tablet 1 tablet, 1 tablet, Oral, Daily, Love, Pamela S, PA-C, 1 tablet at 07/13/22 0844   feeding supplement (ENSURE ENLIVE / ENSURE PLUS) liquid 237 mL, 237 mL, Oral, BID BM, Lovorn, Megan, MD, 237 mL at 07/13/22 1515   guaiFENesin-dextromethorphan (ROBITUSSIN DM) 100-10 MG/5ML syrup 5-10 mL, 5-10 mL, Oral, Q6H PRN, Love, Pamela S, PA-C   insulin aspart (novoLOG) injection 0-5 Units, 0-5 Units, Subcutaneous, QHS, Love, Pamela S, PA-C, 2 Units at 07/11/22 2112   insulin aspart (novoLOG) injection 0-9 Units, 0-9 Units, Subcutaneous, TID WC, Alphia Moh D, RPH, 3 Units at  07/13/22 1717   magic mouthwash w/lidocaine, 5 mL, Oral, QID, Love, Pamela S, PA-C, 5 mL at 07/13/22 1512   menthol-cetylpyridinium (CEPACOL) lozenge 3 mg, 1 lozenge, Oral, PRN, Elijah Birk C, DO, 3 mg at 07/11/22 2106   metFORMIN (GLUCOPHAGE) tablet 500 mg, 500 mg, Oral, BID WC, Fanny Dance, MD, 500 mg at 07/13/22 1717   multivitamin with minerals tablet 1 tablet, 1 tablet, Oral, Daily, Lovorn, Megan, MD, 1 tablet at 07/13/22 4098   Oral care  mouth rinse, 15 mL, Mouth Rinse, 4 times per day, Love, Pamela S, PA-C, 15 mL at 07/13/22 1515   pantoprazole (PROTONIX) EC tablet 40 mg, 40 mg, Oral, BID AC, Love, Pamela S, PA-C, 40 mg at 07/13/22 1717   PARoxetine (PAXIL) tablet 20 mg, 20 mg, Oral, Daily, Love, Pamela S, PA-C, 20 mg at 07/13/22 0843   predniSONE (DELTASONE) tablet 10 mg, 10 mg, Oral, Q breakfast, Lovorn, Megan, MD, 10 mg at 07/13/22 0843   prochlorperazine (COMPAZINE) tablet 5-10 mg, 5-10 mg, Oral, Q6H PRN **OR** prochlorperazine (COMPAZINE) suppository 12.5 mg, 12.5 mg, Rectal, Q6H PRN **OR** prochlorperazine (COMPAZINE) injection 5-10 mg, 5-10 mg, Intramuscular, Q6H PRN, Love, Pamela S, PA-C   pyridostigmine (MESTINON) tablet 60 mg, 60 mg, Oral, Q8H, Lovorn, Megan, MD, 60 mg at 07/13/22 1511   sodium phosphate (FLEET) 7-19 GM/118ML enema 1 enema, 1 enema, Rectal, Once PRN, Love, Pamela S, PA-C   Exam: Current vital signs: BP 102/66 (BP Location: Right Arm)   Pulse (!) 104   Temp 97.9 F (36.6 C) (Oral)   Resp 16   Ht 5\' 5"  (1.651 m)   Wt 49.7 kg   SpO2 99%   BMI 18.23 kg/m  Vital signs in last 24 hours: Temp:  [97.9 F (36.6 C)-98.6 F (37 C)] 97.9 F (36.6 C) (06/13 1321) Pulse Rate:  [88-104] 104 (06/13 1321) Resp:  [16-20] 16 (06/13 1321) BP: (102-113)/(65-76) 102/66 (06/13 1321) SpO2:  [99 %-100 %] 99 % (06/13 1321) Weight:  [49.7 kg] 49.7 kg (06/13 1321)  GENERAL: Awake, alert, in no acute distress Psych: Affect appropriate for situation, patient is calm and cooperative with examination Head: Normocephalic and atraumatic, without obvious abnormality EENT: Normal conjunctivae, dry mucous membranes, no OP obstruction LUNGS: Normal respiratory effort. Non-labored breathing on room air Extremities: warm, well perfused, without obvious deformity  NEURO:  Mental Status: Awake, alert, and oriented to person, place, time, and situation. He is able to provide a clear and coherent history of present  illness. Speech/Language: speech is clear and fluent.   No neglect is noted Cranial Nerves:  II: PERRL III, IV, VI: EOMI. right-sided ptosis which patient says has been present since before admission and no diplopia with sustained upgaze V: Sensation is intact to light touch and symmetrical to face.  VII: Face is symmetric resting and smiling.  IX, X: Phonation normal.  XI: Normal sternocleidomastoid and trapezius muscle strength XII: Tongue protrudes midline without fasciculations.   Motor: 5/5 strength to bilateral upper extremities, proximal and distal, 4+/5 strength to bilateral lower extremities, proximal and distal, 5/5 strength neck flexion and extension Tone is normal. Bulk is normal.  Sensation: Intact to light touch bilaterally in all four extremities.  Coordination: FTN intact bilaterally. No pronator drift.  Gait: Deferred  Able to count to 20 on a single exhalation NIF -40  Labs I have reviewed labs in epic and the results pertinent to this consultation are:   CBC    Component Value Date/Time  WBC 7.5 07/10/2022 0603   RBC 2.73 (L) 07/10/2022 0603   HGB 7.8 (L) 07/10/2022 0603   HCT 24.4 (L) 07/10/2022 0603   PLT 422 (H) 07/10/2022 0603   MCV 89.4 07/10/2022 0603   MCH 28.6 07/10/2022 0603   MCHC 32.0 07/10/2022 0603   RDW 16.2 (H) 07/10/2022 0603   LYMPHSABS 2.6 07/08/2022 0453   MONOABS 0.5 07/08/2022 0453   EOSABS 0.2 07/08/2022 0453   BASOSABS 0.0 07/08/2022 0453    CMP     Component Value Date/Time   NA 135 07/08/2022 0453   K 3.9 07/08/2022 0453   CL 101 07/08/2022 0453   CO2 24 07/08/2022 0453   GLUCOSE 185 (H) 07/08/2022 0453   BUN 23 07/08/2022 0453   CREATININE 0.97 07/08/2022 0453   CALCIUM 8.0 (L) 07/08/2022 0453   CALCIUM 10.3 11/23/2008 2224   PROT 5.5 (L) 07/08/2022 0453   ALBUMIN 2.3 (L) 07/08/2022 0453   AST 52 (H) 07/08/2022 0453   ALT 64 (H) 07/08/2022 0453   ALKPHOS 48 07/08/2022 0453   BILITOT 0.4 07/08/2022 0453    GFRNONAA >60 07/08/2022 0453   GFRAA >60 09/22/2017 0434    Lipid Panel     Component Value Date/Time   CHOL 162 03/04/2021 0830   TRIG 109 06/27/2022 0602   TRIG 133 02/09/2006 0800   HDL 47.00 03/04/2021 0830   CHOLHDL 3 03/04/2021 0830   VLDL 41.6 (H) 03/04/2021 0830   LDLCALC 68 07/23/2008 1547   LDLDIRECT 95.0 03/04/2021 0830     Imaging I have reviewed the images obtained:  No relevant imaging this admission  Assessment: 67 year old patient with history of diabetes, hepatitis B, hypertension, anxiety, malignant thymoma status postresection, myasthenia gravis with recent flare who was admitted to rehabilitation on 6/7 after hospital stay for myasthenia gravis flare in which he was intubated and received treatment with IVIG.  Patient reports no dyspnea, no diplopia, and only difficulty with swallowing due to sore throat and some fatigue after working with therapy for long periods of time.  He states that he does not have too much fatigue if he paces his activity and allows rest periods.  NIF is -40, and patient is able to count to 20 on a single exhalation, so do not feel that he is in danger of respiratory compromise at this time.  Impression: Patient recovering from myasthenia gravis flare without acute worsening of condition  Recommendations: -Continue NIF and vital capacity every 12 hours -Continue Mestinon  -Increase prednisone to 15 mg daily -Neurology will follow as needed   Pt seen by NP/Neuro and later by MD. Note/plan to be edited by MD as needed.  Cortney E Ernestina Columbia , MSN, AGACNP-BC [facility] See Amion for schedule and pager information 07/13/2022 6:16 PM   I have seen the patient and reviewed the above note.  He has good neck flexion strength, but he does still have significant right ptosis.  He states that at baseline, he does not have as much ptosis.  His negative inspiratory forces have been low throughout his hospitalization, and quite in  fact discordant from his vital capacity and reliably so.  I would favor using vital capacity to measure his respiratory status as opposed to going by negative inspiratory force.  Given his persistent ptosis, it is not unreasonable to increase his prednisone very slightly, I would not increase to the point of crisis treatment, as this can actually worsen myasthenic symptoms, but slow increases are unlikely  to cause problems.  I will increase prednisone to 15 mg daily and avoid continue this regimen until he follows up with his outpatient neurologist.  Neurology will continue to be available as needed, please call with questions or concerns.  Ritta Slot, MD [facility] [phone]  If 7pm- 7am, please page neurology on call as listed in AMION.

## 2022-07-13 NOTE — Progress Notes (Signed)
Patient ID: [name], male   DOB: [birth year], 67 y.o.   MRN: [id number] Met with pt and wife who is here for family education. Asked about the equipment needs ie tub bench and rollator. Wife is aware of the private pay for the tub bench but wants worker to order one for pt. Have made referral to [employer] for the rollator and tub bench. Working on home health follow up also.

## 2022-07-13 NOTE — Progress Notes (Signed)
Occupational Therapy Session Note  Patient Details  Name: [name] MRN: [id number] Date of Birth: Apr 04, 1955  Today's Date: 07/13/2022 OT Individual Time: 0900-1000 OT Individual Time Calculation (min): 60 min    Short Term Goals: Week 1:  OT Short Term Goal 1 (Week 1): STGs=LTGS due to patient's length of stay.  Skilled Therapeutic Interventions/Progress Updates:   Pt seen for skilled OT session with focus on Family Education with wife. Pt requesting shower retraining. 1st OT worked with both wife and pt on safe mobility assisting and use of rollator for energy conservation and balance- emphasis on gait belt consistency and where to hold for max support. Pt amb with rollator with wife assisting pt sufficiently to and from room to demo apt space for TTB education and rec. Wife able to support pt in and out with education for water mngt as well as grab bar rec or use of suction cup grab bar- both still deciding. Back in room, OT worked with wife and pt to complete full shower, toileting and dressing skills training. Safety, task simplification and fall prevention trng conducted with + teach back. TTB and rollator for energy conservation and balance. Wife in agreement as well as pt. MSW and family determining who will provide DME. Transfer trng for use of rollator to bed for rest with wife assisting with close S. Left supine with bed exit engaged, needs and nurse call button in reach with wife bedside.    Therapy Documentation Precautions:  Precautions Precautions: Fall, Other (comment) Precaution Comments: Watch BP and RR; Aspiration Restrictions Weight Bearing Restrictions: No    Therapy/Group: Individual Therapy  Vicenta Dunning 07/13/2022, 8:29 AM

## 2022-07-13 NOTE — Progress Notes (Addendum)
PROGRESS NOTE   Subjective/Complaints:  No new concerns this AM. Denies shortness of breath.  ROS: Denies N/V, abdominal pain, constipation, diarrhea, SOB, cough, chest pain, HA , vision changes, new weakness or paraesthesias.   + R ptosis- unchanged   Objective:   No results found. No results for input(s): "WBC", "HGB", "HCT", "PLT" in the last 72 hours.  No results for input(s): "NA", "K", "CL", "CO2", "GLUCOSE", "BUN", "CREATININE", "CALCIUM" in the last 72 hours.   Intake/Output Summary (Last 24 hours) at 07/13/2022 0845 Last data filed at 07/12/2022 1813 Gross per 24 hour  Intake 1318 ml  Output 325 ml  Net 993 ml         Physical Exam: Vital Signs Blood pressure 110/65, pulse 88, temperature 98.6 F (37 C), temperature source Oral, resp. rate 16, height 5\' 5"  (1.651 m), weight 53.1 kg, SpO2 100 %.    Constitutional: No apparent distress. Working in gym  HENT: No JVD. Neck Supple. Trachea midline. Atraumatic, normocephalic. Cortrak has been removed + R eye ptosis Cardiovascular: RRR Respiratory: CTAB. No rales, rhonchi, or wheezing. On RA.  Abdomen: + bowel sounds, normoactive. No distention or tenderness.  GU: Not examined. + condom cath, draining clear urine.  Skin: Warm and dry, ecchymotic area LLQ and multiple small bruises from heparin injection.    MSK:      No apparent deformity.                RUE: 4/5 throughout                LUE: 5-/5 throughout-improved                RLE: 5-/5 throughout-improved                LLE:  5-/5 throughout-improved   Neurologic exam:  Cognition: AAO to person, place, time.  No apparent cognitive deficits Insight: Good insight into current condition.  Mood: Pleasant.  Appropriate affect.  Sensation: To light touch intact in BL UEs and LEs  Reflexes: 1+ in BL UE and LEs. Negative Hoffman's and babinski signs bilaterally.  CN: + R ptosis stable. + mild R facial  droop-unchanged Coordination: Bilateral upper extremity tremors with antigravity range of motion  spasticity: MAS 0 in all extremities.     Assessment/Plan: 1. Functional deficits which require 3+ hours per day of interdisciplinary therapy in a comprehensive inpatient rehab setting. Physiatrist is providing close team supervision and 24 hour management of active medical problems listed below. Physiatrist and rehab team continue to assess barriers to discharge/monitor patient progress toward functional and medical goals  Care Tool:  Bathing    Body parts bathed by patient: Right arm, Left arm, Chest, Abdomen, Front perineal area, Buttocks, Right upper leg, Left upper leg, Right lower leg, Left lower leg, Face         Bathing assist Assist Level: Contact Guard/Touching assist     Upper Body Dressing/Undressing Upper body dressing   What is the patient wearing?: Pull over shirt    Upper body assist Assist Level: Set up assist    Lower Body Dressing/Undressing Lower body dressing      What  is the patient wearing?: Pants     Lower body assist Assist for lower body dressing: Minimal Assistance - Patient > 75%     Toileting Toileting Toileting Activity did not occur Press photographer and hygiene only): N/A (no void or bm)  Toileting assist Assist for toileting: Contact Guard/Touching assist     Transfers Chair/bed transfer  Transfers assist     Chair/bed transfer assist level: Minimal Assistance - Patient > 75%     Locomotion Ambulation   Ambulation assist      Assist level: Minimal Assistance - Patient > 75% Assistive device: Walker-rolling Max distance: 50   Walk 10 feet activity   Assist     Assist level: Minimal Assistance - Patient > 75% Assistive device: Walker-rolling   Walk 50 feet activity   Assist    Assist level: Minimal Assistance - Patient > 75% Assistive device: Walker-rolling    Walk 150 feet activity   Assist Walk 150  feet activity did not occur: Safety/medical concerns (2/2 fatigue)         Walk 10 feet on uneven surface  activity   Assist     Assist level: Minimal Assistance - Patient > 75% Assistive device: Walker-rolling   Wheelchair     Assist Is the patient using a wheelchair?: No   Wheelchair activity did not occur: N/A         Wheelchair 50 feet with 2 turns activity    Assist    Wheelchair 50 feet with 2 turns activity did not occur: N/A       Wheelchair 150 feet activity     Assist  Wheelchair 150 feet activity did not occur: N/A       Blood pressure 110/65, pulse 88, temperature 98.6 F (37 C), temperature source Oral, resp. rate 16, height 5\' 5"  (1.651 m), weight 53.1 kg, SpO2 100 %.  Medical Problem List and Plan: 1. Functional deficits secondary to myasthenia gravis flare             -patient may shower             -ELOS/Goals: 18-21 days, Min A PT/OT/SLP             -Appreciate hospitalist discharge notation: Medications that may worsen or trigger MG exacerbation: Class IA antiarrhythmics, magnesium, flouroquinolones, macrolides, aminoglycosides, penicillamine, curare, interferon alpha, botox, quinine. Use with caution: calcium channel blockers, beta blockers and statins.   -Est discharge 6/17  -Limit session duration, do no work to exhaustion      2.  Antithrombotics: -DVT/anticoagulation:  Pharmaceutical: Lovenox-> SCDs 6-9, see below             -antiplatelet therapy: N/A   3. Pain Management: Tylenol prn.    4. Mood/Behavior/Sleep: Uses Ambien 10 mg as needed at home. LCSW to follow for evaluation and support. Team to provide ego support.              -antipsychotic agents: N/A             --Melatonin prn for insomnia.    5. Neuropsych/cognition: This patient is capable of making decisions on his own behalf. 6. Skin/Wound Care: Routine pressure relief measures 7. Fluids/Electrolytes/Nutrition/dysphagia: Monitor I/O. Change tube feed to  60  cc/hr X 10 hours w/H2O qid.  --start Calorie count tomorrow. Wife encouraged to bring soft foods from home.               --downgrade diet to D2--reports feeling food sticking in  throat/throat discomfort from intubations             --MMW added.   -6/9: He is gradually improving, however remain inadequate.  Wait for nutrition This week, should be able to remove core track of p.o.'s improvement next few days.  Added Cepacol lozenge for throat discomfort.  -6/10 appears to be eating more, consider remove cor track in new few days  6/11 50-65 % of meals eaten, pt to work with SLP today, continue cor track now now given pt reports of some concerns about swallow this AM, SLP to eval this AM  6/12 remove cor trak today  6/13 continue to monitor fluid intake, stable   8. MG flare w/ respiratory failure: Has completed course of IVIG.  --Continue to monitor NIF/VC--> NIF -40 VC-1.2 L this am.              --Continue home regimen of Mestinon and Prednisone.   -6/12 NIF appears decreased today, will ask respiratory to recheck, if continues to be low will contract neurology   -6/13 NIF went up to -40 yesterday, 25 this AM, continue to monitor, contact neurology   9. Anxiety: Continue Klonopin, Paxil and prn Xanax. Continue to provide ego support.  --Patient frustrated/expressing disappointment that he has not rebounded as in the past. Continue to encourage/provide support.  -- Would benefit from neuropsych evaluation --6-8: Encouraged family regarding patient's low energy, attributable to current MG flare in addition to medications required for mood control.  Will add BuSpar 5 mg twice daily, wean Klonopin to 0.5 mg twice daily if tolerated. -6/9: Anxiety improving, has not required as needed Xanax the last 2 days.  Monitor tonight, consider DC scheduled Klonopin in the a.m. if he remains stable 6/10 one use of xanax last night, overall anxiety improved 6/12 one dose xanax use, overall stable, continue  to monitor   6/13 continues to have intermittent anxiety, will need to be careful to distinguish episodes from respiratory worsening   10. T2DM: Hgb A1c--5.7 and well controlled. Will monitor BS ac/hs and use SSI for elevated BS             --continue to hold Metformin and Januvia (discussed need to increase intake w/patient)  -- 6-8: Elevated, eating 25 to 50% of meals.  Continue monitoring, consider addition of Semglee or resuming metformin when appropriate.  -6/11 restart metformin 500mg  BID (home dose 1000mg  BID)  -6/13 well controlled this am  Recent Labs    07/12/22 1650 07/12/22 2057 07/13/22 0617  GLUCAP 123* 91 101*        11. Hep B: On Descovy as substitute-->wife to bring Entecavir from home.    12. Hypotension/Tachycardia: Ongoing for weeks. Inpatient workup including CTA chest and lower extremity venous duplex negative  - Monitor for any signs of decline in his NIF or vital capacity, if any worsening discontinue Cardizem.   -BP/HR stable 6/13    07/13/2022    6:21 AM 07/12/2022    8:14 PM 07/12/2022    1:03 PM  Vitals with BMI  Systolic 110 113 409  Diastolic 65 76 73  Pulse 88 95     13. Anemia - HgB 7.9 -> 7.3 -> 7.0 6/8. Stool card pending. Recheck h/h 6/9, hold lovenox, start SCDs. if below 7 will transfuse.  6/9: H&H 7.4; monitor with a.m. labs.  Only obvious cause at this time is Lovenox so we will continue to hold with SCDs; mobility is improving  6/10 HGB up  to 7.8, continue to monitor    LOS: 6 days A FACE TO FACE EVALUATION WAS PERFORMED  Fanny Dance 07/13/2022, 8:45 AM

## 2022-07-14 LAB — GLUCOSE, CAPILLARY
Glucose-Capillary: 113 mg/dL — ABNORMAL HIGH (ref 70–99)
Glucose-Capillary: 232 mg/dL — ABNORMAL HIGH (ref 70–99)
Glucose-Capillary: 137 mg/dL — ABNORMAL HIGH (ref 70–99)
Glucose-Capillary: 104 mg/dL — ABNORMAL HIGH (ref 70–99)

## 2022-07-14 LAB — BASIC METABOLIC PANEL
Anion gap: 9 (ref 5–15)
BUN: 18 mg/dL (ref 8–23)
CO2: 23 mmol/L (ref 22–32)
Calcium: 8.9 mg/dL (ref 8.9–10.3)
Chloride: 99 mmol/L (ref 98–111)
Creatinine, Ser: 1.03 mg/dL (ref 0.61–1.24)
GFR, Estimated: >60 mL/min (ref >60)
Glucose, Bld: 132 mg/dL — ABNORMAL HIGH (ref 70–99)
Potassium: 4 mmol/L (ref 3.5–5.1)
Sodium: 131 mmol/L — ABNORMAL LOW (ref 135–145)

## 2022-07-14 MED ORDER — PREDNISONE 10 MG PO TABS
15.0000 mg | ORAL_TABLET | Freq: Every day | ORAL | Status: DC
  Administered 2022-07-14 – 2022-07-18 (×5): 15 mg via ORAL
  Filled 2022-07-14 (×5): qty 2

## 2022-07-14 NOTE — Progress Notes (Signed)
Met with patient. Reports no issue with swallowing. Feels good about going home. Reports no pain. Continent B/B. Is concerned about anxiety once he returns home. Wants to continue he Xanax and Klonopin. Reports that is working well. Educated on discharge process.  Will discharge with medications that taking here but will have to discuss with PCP in reference to the above most likely.  Patient reports that seems he was taking a lot more medications at home than here and that he will compare when getting back home. Advised to only take the medications he is discharged home with and when going to PCP or other follow ups they can change if need to. Informed him that he is taking Descovy here but has a different medication at home for his hepatitis B. He would return to his original medication for that. Informed again to only take the medications listed on the discharge paperwork until he goes to his follow ups. Advised to keep record of blood pressure and blood sugar. Takes his blood sugar at home along with antidiabetic medications. Going home with spouse to a 1 level and using 1 ste entrance with no rail as an alternative to the 6 steps. All needs met, call bell in reach.

## 2022-07-14 NOTE — Progress Notes (Signed)
PROGRESS NOTE   Subjective/Complaints:  Lying in bed this AM, appears comfortable. No new concerns this AM. Ptosis improved.   ROS: Denies N/V, abdominal pain, constipation, diarrhea, SOB, cough, chest pain, HA , vision changes, new weakness or paraesthesias.   + R ptosis- improved  Objective:   No results found. No results for input(s): "WBC", "HGB", "HCT", "PLT" in the last 72 hours.  No results for input(s): "NA", "K", "CL", "CO2", "GLUCOSE", "BUN", "CREATININE", "CALCIUM" in the last 72 hours.   Intake/Output Summary (Last 24 hours) at 07/14/2022 0821 Last data filed at 07/14/2022 0003 Gross per 24 hour  Intake 900 ml  Output 1100 ml  Net -200 ml         Physical Exam: Vital Signs Blood pressure 106/68, pulse 96, temperature 98.6 F (37 C), temperature source Oral, resp. rate 17, height 5\' 5"  (1.651 m), weight 50.6 kg, SpO2 100 %.    Constitutional: No apparent distress. Working in gym  HENT: No JVD. Neck Supple. Trachea midline. Atraumatic, normocephalic. Cortrak has been removed + R eye ptosis Cardiovascular: RRR Respiratory: CTAB. No rales, rhonchi, or wheezing. On RA.  Abdomen: + bowel sounds, normoactive. No distention or tenderness.  GU: Not examined. + condom cath, draining clear urine.  Skin: Warm and dry, ecchymotic area LLQ and multiple small bruises from heparin injection.    MSK:      No apparent deformity.                RUE: 5-/5 throughout                LUE: 5-/5 throughout-improved                RLE: 4+/5 throughout-improved                LLE:  4+/5 throughout-improved   Neurologic exam:  Cognition: AAO to person, place, time.  No apparent cognitive deficits Insight: Good insight into current condition.  Mood: Pleasant.  Appropriate affect.  Sensation: To light touch intact in BL UEs and LEs  Reflexes: 1+ in BL UE and LEs. Negative Hoffman's and babinski signs bilaterally.  CN: + R  ptosis improved. + mild R facial droop-unchanged Coordination: Bilateral upper extremity tremors with antigravity range of motion  spasticity: MAS 0 in all extremities.     Assessment/Plan: 1. Functional deficits which require 3+ hours per day of interdisciplinary therapy in a comprehensive inpatient rehab setting. Physiatrist is providing close team supervision and 24 hour management of active medical problems listed below. Physiatrist and rehab team continue to assess barriers to discharge/monitor patient progress toward functional and medical goals  Care Tool:  Bathing    Body parts bathed by patient: Right arm, Left arm, Chest, Abdomen, Front perineal area, Buttocks, Right upper leg, Left upper leg, Right lower leg, Left lower leg, Face         Bathing assist Assist Level: Contact Guard/Touching assist     Upper Body Dressing/Undressing Upper body dressing   What is the patient wearing?: Pull over shirt    Upper body assist Assist Level: Set up assist    Lower Body Dressing/Undressing Lower body dressing  What is the patient wearing?: Pants     Lower body assist Assist for lower body dressing: Minimal Assistance - Patient > 75%     Toileting Toileting Toileting Activity did not occur (Clothing management and hygiene only): N/A (no void or bm)  Toileting assist Assist for toileting: Contact Guard/Touching assist     Transfers Chair/bed transfer  Transfers assist     Chair/bed transfer assist level: Minimal Assistance - Patient > 75%     Locomotion Ambulation   Ambulation assist      Assist level: Minimal Assistance - Patient > 75% Assistive device: Walker-rolling Max distance: 50   Walk 10 feet activity   Assist     Assist level: Minimal Assistance - Patient > 75% Assistive device: Walker-rolling   Walk 50 feet activity   Assist    Assist level: Minimal Assistance - Patient > 75% Assistive device: Walker-rolling    Walk 150  feet activity   Assist Walk 150 feet activity did not occur: Safety/medical concerns (2/2 fatigue)         Walk 10 feet on uneven surface  activity   Assist     Assist level: Minimal Assistance - Patient > 75% Assistive device: Walker-rolling   Wheelchair     Assist Is the patient using a wheelchair?: No   Wheelchair activity did not occur: N/A         Wheelchair 50 feet with 2 turns activity    Assist    Wheelchair 50 feet with 2 turns activity did not occur: N/A       Wheelchair 150 feet activity     Assist  Wheelchair 150 feet activity did not occur: N/A       Blood pressure 106/68, pulse 96, temperature 98.6 F (37 C), temperature source Oral, resp. rate 17, height 5\' 5"  (1.651 m), weight 50.6 kg, SpO2 100 %.  Medical Problem List and Plan: 1. Functional deficits secondary to myasthenia gravis flare             -patient may shower             -ELOS/Goals: 18-21 days, Min A PT/OT/SLP             -Appreciate hospitalist discharge notation: Medications that may worsen or trigger MG exacerbation: Class IA antiarrhythmics, magnesium, flouroquinolones, macrolides, aminoglycosides, penicillamine, curare, interferon alpha, botox, quinine. Use with caution: calcium channel blockers, beta blockers and statins.   -Est discharge 6/17  -Limit session duration, do no work to exhaustion, asked therapy to schedule shorter sessions with more rest breaks     2.  Antithrombotics: -DVT/anticoagulation:  Pharmaceutical: Lovenox-> SCDs 6-9, see below             -antiplatelet therapy: N/A   3. Pain Management: Tylenol prn.    4. Mood/Behavior/Sleep: Uses Ambien 10 mg as needed at home. LCSW to follow for evaluation and support. Team to provide ego support.              -antipsychotic agents: N/A             --Melatonin prn for insomnia.    5. Neuropsych/cognition: This patient is capable of making decisions on his own behalf. 6. Skin/Wound Care: Routine  pressure relief measures 7. Fluids/Electrolytes/Nutrition/dysphagia: Monitor I/O. Change tube feed to 60  cc/hr X 10 hours w/H2O qid.  --start Calorie count tomorrow. Wife encouraged to bring soft foods from home.               --  downgrade diet to D2--reports feeling food sticking in throat/throat discomfort from intubations             --MMW added.   -6/9: He is gradually improving, however remain inadequate.  Wait for nutrition This week, should be able to remove core track of p.o.'s improvement next few days.  Added Cepacol lozenge for throat discomfort.  -6/10 appears to be eating more, consider remove cor track in new few days  6/11 50-65 % of meals eaten, pt to work with SLP today, continue cor track now now given pt reports of some concerns about swallow this AM, SLP to eval this AM  6/12 remove cor trak today  6/14 eating about 75% of meals, continue to monitor   8. MG flare w/ respiratory failure: Has completed course of IVIG.  --Continue to monitor NIF/VC--> NIF -40 VC-1.2 L this am.              --Continue home regimen of Mestinon and Prednisone.   -6/12 NIF appears decreased today, will ask respiratory to recheck, if continues to be low will contract neurology   -6/13 NIF went up to -40 yesterday, 25 this AM, continue to monitor, contact neurology  -6/14, Seen by neurology yesterday, prednisone increased to 15mg  daily, appreciate assistance   9. Anxiety: Continue Klonopin, Paxil and prn Xanax. Continue to provide ego support.  --Patient frustrated/expressing disappointment that he has not rebounded as in the past. Continue to encourage/provide support.  -- Would benefit from neuropsych evaluation --6-8: Encouraged family regarding patient's low energy, attributable to current MG flare in addition to medications required for mood control.  Will add BuSpar 5 mg twice daily, wean Klonopin to 0.5 mg twice daily if tolerated. -6/9: Anxiety improving, has not required as needed Xanax the  last 2 days.  Monitor tonight, consider DC scheduled Klonopin in the a.m. if he remains stable 6/10 one use of xanax last night, overall anxiety improved 6/12 one dose xanax use, overall stable, continue to monitor   6/13 continues to have intermittent anxiety, will need to be careful to distinguish episodes from respiratory worsening  6/14) Occasional use of Xanax PRN, continue current   10. T2DM: Hgb A1c--5.7 and well controlled. Will monitor BS ac/hs and use SSI for elevated BS             --continue to hold Metformin and Januvia (discussed need to increase intake w/patient)  -- 6-8: Elevated, eating 25 to 50% of meals.  Continue monitoring, consider addition of Semglee or resuming metformin when appropriate.  -6/11 restart metformin 500mg  BID (home dose 1000mg  BID)  -6/14 overall fairly controlled, may need to increase metformin if PO intake increases   Recent Labs    07/13/22 1645 07/13/22 2056 07/14/22 0605  GLUCAP 207* 138* 113*        11. Hep B: On Descovy as substitute-->wife to bring Entecavir from home.    12. Hypotension/Tachycardia: Ongoing for weeks. Inpatient workup including CTA chest and lower extremity venous duplex negative  - Monitor for any signs of decline in his NIF or vital capacity, if any worsening discontinue Cardizem.   -BP/HR stable 6/13    07/14/2022    5:05 AM 07/13/2022    7:40 PM 07/13/2022    1:21 PM  Vitals with BMI  Weight 111 lbs 9 oz  109 lbs 9 oz  BMI 18.56  18.23  Systolic 106 108 161  Diastolic 68 68 66  Pulse 96 106 104  13. Anemia - HgB 7.9 -> 7.3 -> 7.0 6/8. Stool card pending. Recheck h/h 6/9, hold lovenox, start SCDs. if below 7 will transfuse.  6/9: H&H 7.4; monitor with a.m. labs.  Only obvious cause at this time is Lovenox so we will continue to hold with SCDs; mobility is improving  6/10 HGB up to 7.8, continue to monitor    LOS: 7 days A FACE TO FACE EVALUATION WAS PERFORMED  Fanny Dance 07/14/2022, 8:21 AM

## 2022-07-14 NOTE — Patient Instructions (Signed)

## 2022-07-14 NOTE — Progress Notes (Signed)
Occupational Therapy Session Note  Patient Details  Name: [name] MRN: [id number] Date of Birth: [birth year]  Today's Date: 07/14/2022 OT Individual Time: 1005-1045 OT Individual Time Calculation (min): 40 min    Short Term Goals: Week 1:  OT Short Term Goal 1 (Week 1): STGs=LTGS due to patient's length of stay.  Skilled Therapeutic Interventions/Progress Updates:  Skilled OT intervention completed with focus on ambulatory endurance and IADL meal prep/kitchen management education. Pt received seated in w/c with daughter present, agreeable to session. No pain reported.  Pt expressed fatigue, also desiring to use RW, however agreeable to session and with use of rollator for purpose of kitchen tasks. Completed all sit > stands and ambulatory transfers using rollator during session with only 1 instance of LOB with backwards stepping with door management, but able to recover without external assist.   Ambulated to ADL kitchen. In kitchen, pt demonstrated ability to reach into various cabinet heights, gather items including midly heavy pans/items, open/close fridge door, all at the supervision level without LOB while using rollator. Pt was able to prep a simple stove top meal via meal cards, with accurate sequencing, safety awareness and higher level dual tasking. Discussed cutting and microwave safety with modified strategies for fatigue. Able to wash full set of dishes and return items back to cabinets with supervision without AD but use of counter for balance. Pt did have fatigue, but was able to self-initiate use of rollator seat for seated rest breaks without difficulty during session.  Ambulated back to room, then remained seated in w/c, with belt alarm on/activated, and with all needs in reach at end of session.   Therapy Documentation Precautions:  Precautions Precautions: Fall, Other (comment) Precaution Comments: Watch BP and RR; Aspiration Restrictions Weight Bearing  Restrictions: No    Therapy/Group: Individual Therapy  Melvyn Novas, MS, OTR/L  07/14/2022, 12:11 PM

## 2022-07-14 NOTE — Progress Notes (Signed)
Rt attempted to obtain an NIF and VC, pt was not available. RT will attempt again if schedule permits.

## 2022-07-14 NOTE — Progress Notes (Signed)
Patient performed NIF and VC with good effort  NIF -40  VC 2.1L

## 2022-07-14 NOTE — Progress Notes (Signed)
Physical Therapy Session Note  Patient Details  Name: [name] MRN: [id number] Date of Birth: 04-20-1955  Today's Date: 07/14/2022 PT Individual Time: 4098-1191 + 4782-9562 PT Individual Time Calculation (min): 40 min  +  Short Term Goals: Week 1:  PT Short Term Goal 1 (Week 1): STG=LTG 2/2 LOS  Skilled Therapeutic Interventions/Progress Updates:     SESSION 1: Pt presents in room in bed, agreeable to PT. Pt denies pain. Session focused on gait training and NMR for BUE/BLE coordination and dynamic standing balance. Pt completes transfers with CGA/min assist with and without device this session.  Pt ambulates from room to main gym with rollator CGA/close supervision.  Pt completes NMR for BUE/BLE coordination, dynamic standing balance, and standing tolerance including: Self ball toss seated x30 seconds Self ball toss standing 2x30 seconds Standing marches x26 with verbal cues for "soft" feet Standing marches holding 1# med ball x30 Standing marches with palloff press 1# med ball x20 Standing marches with self ball toss x20 1# med ball  Pt ambulates 95' without device CGA/light min assist to promote improved dynamic postural stability needed for functional transfers. Pt demonstrates slow gait speed and increased postural sway requiring CGA/light min assist to correct.  Pt ambulates back to room with rollator close supervision, verbal cues for directional cues. Pt remains seated in WC with all needs within reach, call light in place, and chair alarm donned and activated at end of session. Pt daughter in room at end of session.  SESSION 2: Pt presents in room in bed, asleep but awakens easily, motivated to participate with PT. Pt HR monitored throughout session with HR 105-110 at rest to 125bpm with activity. Session focused on therapeutic exercises to promote activity tolerance and BLE strengthening as well as gait training with and without rollator to promote dynamic balance  with functional mobility. Pt completes transfers with CGA/supervision throughout session.  Pt completes bed mobility distant supervision, completes sit to stand transfer with rollator and ambulates from room to main gym with rollator 150' with supervision. Pt sits to mat for seated rest break.  Pt then completes forward/backward ambulation with rollator 3x12' without device 3x12', completed to promote multidirectional stepping stability.  Pt completes up/down 8 steps with BHRs CGA, step to gait pattern ascending with LLE, descending with RLE. Pt completes step ups x10 BLE no UE support  Pt completes interval training in standing no UE support for BLE strengthening, activity tolerance, and dynamic standing balance 6 minutes total cycling through 3 exercises 30 sec work/30 sec rest including: Mini squats 2 sets Marching 2 sets Heel raise 2 sets  Pt ambulates to ortho gym with rollator with supervision and completes stand to sit on nustep. Pt completes 2x70min with BUE/BLE, maintains 45 SPM pace. Completed to promote improved BUE/BLE strengthening, activity tolerance and muscular endurance needed for functional mobility. Pt requires 3.48min rest break between intervals for energy conservation.  Pt ambulates with rollator for total with supervision, maintaining conversation throughout, completed to promote improved tolerance to upright and cardiopulmonary endurance to return to PLOF. Pt ambulates back to room and returns to sitting on EOB, pt locks brakes on rollator prior to sitting without verbal cueing. Pt returns to supine and remains supine with all needs within reach, call light in place, and bed alarm activated at end of session.   Therapy Documentation Precautions:  Precautions Precautions: Fall, Other (comment) Precaution Comments: Watch BP and RR; Aspiration Restrictions Weight Bearing Restrictions: No    Therapy/Group: Individual  Therapy  Edwin Cap PT,  DPT 07/14/2022, 9:47 AM

## 2022-07-14 NOTE — Progress Notes (Signed)
Patient ID: [name], male   DOB: January 08, 1956, 67 y.o.   MRN: [id number]  Have reached out to son-Chris to give him the information regarding the co-pays for the rollator and tub bench. Left a message of the co-pay and number to call to pay them so pt can receive his equipment prior to discharge on Monday. Wife was somewhat confused regarding this.

## 2022-07-14 NOTE — Progress Notes (Signed)
Occupational Therapy Session Note  Patient Details  Name: [name] MRN: [id number] Date of Birth: 04/11/1955  Today's Date: 07/14/2022 OT Individual Time: 7829-5621 OT Individual Time Calculation (min): 72 min      Short Term Goals: Week 1:  OT Short Term Goal 1 (Week 1): STGs=LTGS due to patient's length of stay.  Skilled Therapeutic Interventions/Progress Updates:     Patient agreeable to participate in OT session. Reports 0/10 pain level.   Patient participated in skilled OT session focusing on ADL re-training, functional transfers, functional mobility, UE strength, endurance, and activity tolerance.  Toileting: SBA on regular toilet,  Functional mobility completed within pt's room, bathroom, and hallways with SBA utilizing Rolator. No rest breaks required. Grooming: Stood at sink to complete hand washing and oral care with SBA. Therapist educated patient on proper hand placement during Rolator management and during all functional transfers and sit to stand transitions. Pt required VC for proper hand placement 50% or more of the session. Pt was able to manage brakes on Rolator with SBA and did not require VC to lock. Pt educated on UE strengthening HEP using Red t-band with handout provided for reference. Rest breaks required for fatigue.  Strengthening: - Seated, BUE, shoulder, horizontal abduction/adduction, PNF 1 & 2 pattern, IR/er, adduction, 10X, 1 set. VC and visual demonstration provided for proper form and technique. Rest breaks taken as needed. Set-up assist provided for proper set up using resistance band.  - A/ROM: Seated, shoulder rolls (forward/backward) 5X, 1 set. Scapular retraction/protraction, 10X, 1 set. VC and visual demonstration provided for proper form and technique.     Therapy Documentation Precautions:  Precautions Precautions: Fall, Other (comment) Precaution Comments: Watch BP and RR; Aspiration Restrictions Weight Bearing Restrictions:  No  Therapy/Group: Individual Therapy  Limmie Patricia, OTR/L,CBIS  Supplemental OT - [facility] and [facility] Secure Chat Preferred   07/14/2022, 7:59 AM

## 2022-07-14 NOTE — Progress Notes (Signed)
NIF- -10cmH2o x 1, -40cmH2o x2 VC- 2.0 x3

## 2022-07-14 NOTE — Progress Notes (Shared)
Speech Language Pathology Discharge Summary  Patient Details  Name: [name] MRN: [id number] Date of Birth: 1955-05-20  Date of Discharge from SLP service:July 15, 2022  {chl ip rehab slp time calculations:[id number]}   Skilled Therapeutic Interventions:  ***   Patient has met 6 of 6 long term goals.  Patient to discharge at overall Supervision level.   Reasons goals not met: N/A   Clinical Impression/Discharge Summary: Patient has made functional gains and has met 6 of 6 LTGs this admission. Currently, patient is consuming Dys. 2 textures with thin liquids with minimal overt s/s of aspiration and overall supervision level verbal cues for use of swallowing compensatory strategies. Patient's overall PO intake and swallowing function are impacted by decreased endurance at this time. Patient demonstrates improved speech intelligibility and is ~90% intelligible at the conversation level with overall supervision level verbal cues for use of compensatory strategies. Supervision level verbal cues are also needed for functional problem solving and selective attention with basic and familiar tasks. Patient and family education is complete and patient will discharge home with 24 hour supervision from family. Patient would benefit from follow-up SLP services to maximize his swallowing and cognitive functioning as well as his overall functional communication in order to reduce caregiver burden.   Care Partner:  Caregiver Able to Provide Assistance: Yes  Type of Caregiver Assistance: Physical;Cognitive  Recommendation:  24 hour supervision/assistance;Outpatient SLP  Rationale for SLP Follow Up: Maximize functional communication;Maximize cognitive function and independence;Maximize swallowing safety;Reduce caregiver burden   Equipment: N/A   Reasons for discharge: Treatment goals met   Patient/Family Agrees with Progress Made and Goals Achieved: Yes    Santos Hardwick 07/14/2022, 3:26 PM

## 2022-07-14 NOTE — Progress Notes (Signed)
NIF >40 cmH20 VC 2.2L  Pt performed with great effort.

## 2022-07-15 DIAGNOSIS — E871 Hypo-osmolality and hyponatremia: Secondary | ICD-10-CM

## 2022-07-15 LAB — CBC
HCT: 25.8 % — ABNORMAL LOW (ref 39.0–52.0)
Hemoglobin: 8.3 g/dL — ABNORMAL LOW (ref 13.0–17.0)
MCH: 28.5 pg (ref 26.0–34.0)
MCHC: 32.2 g/dL (ref 30.0–36.0)
MCV: 88.7 fL (ref 80.0–100.0)
Platelets: 399 10*3/uL (ref 150–400)
RBC: 2.91 MIL/uL — ABNORMAL LOW (ref 4.22–5.81)
RDW: 15.1 % (ref 11.5–15.5)
WBC: 6.6 10*3/uL (ref 4.0–10.5)
nRBC: 0 % (ref 0.0–0.2)

## 2022-07-15 LAB — GLUCOSE, CAPILLARY
Glucose-Capillary: 130 mg/dL — ABNORMAL HIGH (ref 70–99)
Glucose-Capillary: 224 mg/dL — ABNORMAL HIGH (ref 70–99)
Glucose-Capillary: 233 mg/dL — ABNORMAL HIGH (ref 70–99)
Glucose-Capillary: 76 mg/dL (ref 70–99)

## 2022-07-15 LAB — BASIC METABOLIC PANEL
Anion gap: 9 (ref 5–15)
BUN: 14 mg/dL (ref 8–23)
CO2: 25 mmol/L (ref 22–32)
Calcium: 8.6 mg/dL — ABNORMAL LOW (ref 8.9–10.3)
Chloride: 98 mmol/L (ref 98–111)
Creatinine, Ser: 1.06 mg/dL (ref 0.61–1.24)
GFR, Estimated: >60 mL/min (ref >60)
Glucose, Bld: 126 mg/dL — ABNORMAL HIGH (ref 70–99)
Potassium: 3.6 mmol/L (ref 3.5–5.1)
Sodium: 132 mmol/L — ABNORMAL LOW (ref 135–145)

## 2022-07-15 NOTE — Plan of Care (Signed)
NIF -40 VC 2L Patient alert and oriented with no signs of respiratory distress. Good effort exerted with NIF and VC.

## 2022-07-15 NOTE — Progress Notes (Signed)
NIF.-40 VC 2.1L Performed with good effort.

## 2022-07-15 NOTE — Progress Notes (Signed)
Occupational Therapy Session Note  Patient Details  Name: [name] MRN: [id number] Date of Birth: 05/30/1955  Today's Date: 07/15/2022 OT Individual Time: 1302-1401 OT Individual Time Calculation (min): 59 min    Short Term Goals: Week 1:  OT Short Term Goal 1 (Week 1): STGs=LTGS due to patient's length of stay.  Skilled Therapeutic Interventions/Progress Updates:  Pt received sitting in [facility] for skilled OT session with focus on functional transfers, discharge planning, and general conditioning. Pt agreeable to interventions, demonstrating overall pleasant mood. Pt with no reports of pain, continuing to be limited by pain. OT offering intermediate rest breaks and positioning suggestions throughout session to address pain/fatigue and maximize participation/safety in session.   Pt ambulates to all therapy locations with supervision + rollator. In ADL apartment, pt performs TTB transfer, stepping over tub-edge, all with CGA + use of tub edge for balance. Pt sits on shower chair, educated on place to purchase. Pt and OT discuss continuation of care, HHOT vs OPOT, pt stating "I don't I need it, but I'll talk to my family. . . ".   In main therapy gym, pt participates in ball-toss activity with use of 2# dowel bar (x2 rounds, 1 min each). Vitals noted below,   BP=112/75 HR=117 O2=100  BP=108/66 HR=122 O2=100  Pt then performs household level functional mobility around unit for continued endurance challenge. Pt able to carry biographical conversation with OT for dual-task challenge. Post walk vitals noted below: BP=102/70 HR=115 O2=100   Pt remained sitting in WC with all immediate needs met at end of session. Pt continues to be appropriate for skilled OT intervention to promote further functional independence.   Therapy Documentation Precautions:  Precautions Precautions: Fall, Other (comment) Precaution Comments: Watch BP and RR; Aspiration Restrictions Weight Bearing  Restrictions: No   Therapy/Group: Individual Therapy  Lou Cal, OTR/L, MSOT  07/15/2022, 5:40 AM

## 2022-07-15 NOTE — Progress Notes (Addendum)
PROGRESS NOTE   Subjective/Complaints:  No new concerns or complaints this morning.  Denies shortness of breath.  ROS: Denies fever, N/V, abdominal pain, constipation, diarrhea, SOB, chest pain, HA , vision changes, new weakness or paraesthesias.   + R ptosis- improved  Objective:   No results found. No results for input(s): "WBC", "HGB", "HCT", "PLT" in the last 72 hours.  Recent Labs    07/14/22 1647  NA 131*  K 4.0  CL 99  CO2 23  GLUCOSE 132*  BUN 18  CREATININE 1.03  CALCIUM 8.9     Intake/Output Summary (Last 24 hours) at 07/15/2022 0959 Last data filed at 07/15/2022 0700 Gross per 24 hour  Intake 1191 ml  Output 900 ml  Net 291 ml         Physical Exam: Vital Signs Blood pressure 107/63, pulse 76, temperature 98.7 F (37.1 C), temperature source Oral, resp. rate 18, height 5\' 5"  (1.651 m), weight 50.4 kg, SpO2 100 %.    Constitutional: No apparent distress. Working in gym  HENT: No JVD. Neck Supple. Trachea midline. Atraumatic, normocephalic.  + R eye ptosis-improved Cardiovascular: RRR Respiratory: CTAB.Good air movement Abdomen: + bowel sounds, normoactive. No distention or tenderness.  GU: Not examined. + condom cath, draining clear urine.  Skin: Warm and dry, ecchymotic area LLQ and multiple small bruises from heparin injection.    MSK:      No apparent deformity.                RUE: 5-/5 throughout                LUE: 5-/5 throughout-improved                RLE: 4+/5 throughout-improved                LLE:  4+/5 throughout-improved   Neurologic exam:  Cognition: AAO to person, place, time.  No apparent cognitive deficits Insight: Good insight into current condition.  Mood: Pleasant.  Appropriate affect.  Sensation: To light touch intact in BL UEs and LEs  Reflexes: 1+ in BL UE and LEs. Negative Hoffman's and babinski signs bilaterally.  CN: + R ptosis improved. + mild R facial  droop-unchanged Coordination: Bilateral upper extremity tremors with antigravity range of motion  spasticity: MAS 0 in all extremities.     Assessment/Plan: 1. Functional deficits which require 3+ hours per day of interdisciplinary therapy in a comprehensive inpatient rehab setting. Physiatrist is providing close team supervision and 24 hour management of active medical problems listed below. Physiatrist and rehab team continue to assess barriers to discharge/monitor patient progress toward functional and medical goals  Care Tool:  Bathing    Body parts bathed by patient: Right arm, Left arm, Chest, Abdomen, Front perineal area, Buttocks, Right upper leg, Left upper leg, Right lower leg, Left lower leg, Face         Bathing assist Assist Level: Contact Guard/Touching assist     Upper Body Dressing/Undressing Upper body dressing   What is the patient wearing?: Pull over shirt    Upper body assist Assist Level: Set up assist    Lower Body Dressing/Undressing  Lower body dressing      What is the patient wearing?: Pants     Lower body assist Assist for lower body dressing: Minimal Assistance - Patient > 75%     Toileting Toileting Toileting Activity did not occur Press photographer and hygiene only): N/A (no void or bm)  Toileting assist Assist for toileting: Contact Guard/Touching assist     Transfers Chair/bed transfer  Transfers assist     Chair/bed transfer assist level: Minimal Assistance - Patient > 75%     Locomotion Ambulation   Ambulation assist      Assist level: Minimal Assistance - Patient > 75% Assistive device: Walker-rolling Max distance: 50   Walk 10 feet activity   Assist     Assist level: Minimal Assistance - Patient > 75% Assistive device: Walker-rolling   Walk 50 feet activity   Assist    Assist level: Minimal Assistance - Patient > 75% Assistive device: Walker-rolling    Walk 150 feet activity   Assist Walk 150  feet activity did not occur: Safety/medical concerns (2/2 fatigue)         Walk 10 feet on uneven surface  activity   Assist     Assist level: Minimal Assistance - Patient > 75% Assistive device: Walker-rolling   Wheelchair     Assist Is the patient using a wheelchair?: No   Wheelchair activity did not occur: N/A         Wheelchair 50 feet with 2 turns activity    Assist    Wheelchair 50 feet with 2 turns activity did not occur: N/A       Wheelchair 150 feet activity     Assist  Wheelchair 150 feet activity did not occur: N/A       Blood pressure 107/63, pulse 76, temperature 98.7 F (37.1 C), temperature source Oral, resp. rate 18, height 5\' 5"  (1.651 m), weight 50.4 kg, SpO2 100 %.  Medical Problem List and Plan: 1. Functional deficits secondary to myasthenia gravis flare             -patient may shower             -ELOS/Goals: 18-21 days, Min A PT/OT/SLP             -Appreciate hospitalist discharge notation: Medications that may worsen or trigger MG exacerbation: Class IA antiarrhythmics, magnesium, flouroquinolones, macrolides, aminoglycosides, penicillamine, curare, interferon alpha, botox, quinine. Use with caution: calcium channel blockers, beta blockers and statins.   -Est discharge 6/17  -Limit session duration, do no work to exhaustion, asked therapy to schedule shorter sessions with more rest breaks     2.  Antithrombotics: -DVT/anticoagulation:  Pharmaceutical: Lovenox-> SCDs 6-9, see below             -antiplatelet therapy: N/A   3. Pain Management: Tylenol prn.    4. Mood/Behavior/Sleep: Uses Ambien 10 mg as needed at home. LCSW to follow for evaluation and support. Team to provide ego support.              -antipsychotic agents: N/A             --Melatonin prn for insomnia.    5. Neuropsych/cognition: This patient is capable of making decisions on his own behalf. 6. Skin/Wound Care: Routine pressure relief measures 7.  Fluids/Electrolytes/Nutrition/dysphagia: Monitor I/O. Change tube feed to 60  cc/hr X 10 hours w/H2O qid.  --start Calorie count tomorrow. Wife encouraged to bring soft foods from home.               --  downgrade diet to D2--reports feeling food sticking in throat/throat discomfort from intubations             --MMW added.   -6/9: He is gradually improving, however remain inadequate.  Wait for nutrition This week, should be able to remove core track of p.o.'s improvement next few days.  Added Cepacol lozenge for throat discomfort.  -6/10 appears to be eating more, consider remove cor track in new few days  6/11 50-65 % of meals eaten, pt to work with SLP today, continue cor track now now given pt reports of some concerns about swallow this AM, SLP to eval this AM  6/12 remove cor trak today  6/15 eating 75 to 100% of his meals, improved   8. MG flare w/ respiratory failure: Has completed course of IVIG.  --Continue to monitor NIF/VC--> NIF -40 VC-1.2 L this am.              --Continue home regimen of Mestinon and Prednisone.   -6/12 NIF appears decreased today, will ask respiratory to recheck, if continues to be low will contract neurology   -6/13 NIF went up to -40 yesterday, 25 this AM, continue to monitor, contact neurology  -6/14, Seen by neurology yesterday, prednisone increased to 15mg  daily, appreciate assistance  -6/15 patient reports improvement with increasing prednisone, NIF has been stable   9. Anxiety: Continue Klonopin, Paxil and prn Xanax. Continue to provide ego support.  --Patient frustrated/expressing disappointment that he has not rebounded as in the past. Continue to encourage/provide support.  -- Would benefit from neuropsych evaluation --6-8: Encouraged family regarding patient's low energy, attributable to current MG flare in addition to medications required for mood control.  Will add BuSpar 5 mg twice daily, wean Klonopin to 0.5 mg twice daily if tolerated. -6/9: Anxiety  improving, has not required as needed Xanax the last 2 days.  Monitor tonight, consider DC scheduled Klonopin in the a.m. if he remains stable 6/10 one use of xanax last night, overall anxiety improved 6/12 one dose xanax use, overall stable, continue to monitor   6/13 continues to have intermittent anxiety, will need to be careful to distinguish episodes from respiratory worsening  6/15 using Xanax about daily, stable continue to monitor   10. T2DM: Hgb A1c--5.7 and well controlled. Will monitor BS ac/hs and use SSI for elevated BS             --continue to hold Metformin and Januvia (discussed need to increase intake w/patient)  -- 6-8: Elevated, eating 25 to 50% of meals.  Continue monitoring, consider addition of Semglee or resuming metformin when appropriate.  -6/11 restart metformin 500mg  BID (home dose 1000mg  BID)  -6/15 well-controlled continue current regimen for now  Recent Labs    07/14/22 1646 07/14/22 2115 07/15/22 0605  GLUCAP 137* 104* 130*        11. Hep B: On Descovy as substitute-->wife to bring Entecavir from home.    12. Hypotension/Tachycardia: Ongoing for weeks. Inpatient workup including CTA chest and lower extremity venous duplex negative  - Monitor for any signs of decline in his NIF or vital capacity, if any worsening discontinue Cardizem.   -6/15 heart rate and BP stable    07/15/2022    6:18 AM 07/15/2022    4:03 AM 07/14/2022    8:08 PM  Vitals with BMI  Weight 111 lbs 2 oz    BMI 18.49    Systolic  107 106  Diastolic  63 66  Pulse  76 86    13. Anemia - HgB 7.9 -> 7.3 -> 7.0 6/8. Stool card pending. Recheck h/h 6/9, hold lovenox, start SCDs. if below 7 will transfuse.  6/9: H&H 7.4; monitor with a.m. labs.  Only obvious cause at this time is Lovenox so we will continue to hold with SCDs; mobility is improving  6/10 HGB up to 7.8, continue to monitor   -Recheck today  14.  Hyponatremia  -Mild, recheck BMP  LOS: 8 days A FACE TO FACE  EVALUATION WAS PERFORMED  Fanny Dance 07/15/2022, 9:59 AM

## 2022-07-16 DIAGNOSIS — I959 Hypotension, unspecified: Secondary | ICD-10-CM

## 2022-07-16 LAB — GLUCOSE, CAPILLARY
Glucose-Capillary: 119 mg/dL — ABNORMAL HIGH (ref 70–99)
Glucose-Capillary: 177 mg/dL — ABNORMAL HIGH (ref 70–99)
Glucose-Capillary: 126 mg/dL — ABNORMAL HIGH (ref 70–99)
Glucose-Capillary: 108 mg/dL — ABNORMAL HIGH (ref 70–99)

## 2022-07-16 MED ORDER — ALPRAZOLAM 0.5 MG PO TABS: 0.5000 mg | ORAL_TABLET | Freq: Three times a day (TID) | ORAL | 0 refills | Status: DC | PRN

## 2022-07-16 MED ORDER — ALPRAZOLAM 0.5 MG PO TABS
0.5000 mg | ORAL_TABLET | Freq: Three times a day (TID) | ORAL | 0 refills | Status: DC | PRN
  Filled 2022-07-16: qty 60, 20d supply, fill #0

## 2022-07-16 MED ORDER — BUSPIRONE HCL 5 MG PO TABS
5.0000 mg | ORAL_TABLET | Freq: Two times a day (BID) | ORAL | 0 refills | Status: DC
  Filled 2022-07-16: qty 60, 30d supply, fill #0

## 2022-07-16 MED ORDER — DILTIAZEM HCL 60 MG PO TABS
60.0000 mg | ORAL_TABLET | Freq: Three times a day (TID) | ORAL | 0 refills | Status: DC
  Filled 2022-07-16: qty 90, 30d supply, fill #0

## 2022-07-16 MED ORDER — PAROXETINE HCL 20 MG PO TABS
20.0000 mg | ORAL_TABLET | Freq: Every day | ORAL | 0 refills | Status: DC
  Filled 2022-07-16: qty 30, 30d supply, fill #0

## 2022-07-16 MED ORDER — PANTOPRAZOLE SODIUM 40 MG PO TBEC
40.0000 mg | DELAYED_RELEASE_TABLET | Freq: Two times a day (BID) | ORAL | 0 refills | Status: DC
  Filled 2022-07-16: qty 60, 30d supply, fill #0

## 2022-07-16 MED ORDER — MAGIC MOUTHWASH W/LIDOCAINE
5.0000 mL | Freq: Four times a day (QID) | ORAL | 0 refills | Status: DC
  Filled 2022-07-16: qty 320, 16d supply, fill #0

## 2022-07-16 MED ORDER — METFORMIN HCL 500 MG PO TABS
500.0000 mg | ORAL_TABLET | Freq: Two times a day (BID) | ORAL | 0 refills | Status: DC
  Filled 2022-07-16: qty 60, 30d supply, fill #0

## 2022-07-16 MED ORDER — CLONAZEPAM 1 MG PO TABS
1.0000 mg | ORAL_TABLET | Freq: Two times a day (BID) | ORAL | 0 refills | Status: DC
  Filled 2022-07-16: qty 30, 15d supply, fill #0

## 2022-07-16 MED ORDER — PREDNISONE 10 MG PO TABS
15.0000 mg | ORAL_TABLET | Freq: Every day | ORAL | 0 refills | Status: DC
  Filled 2022-07-16: qty 90, 60d supply, fill #0

## 2022-07-16 NOTE — Progress Notes (Signed)
Occupational Therapy Discharge Summary  Patient Details  Name: [name] MRN: [id number] Date of Birth: 1955/03/06  Date of Discharge from OT service:July 16, 2022  Today's Date: 07/16/2022 OT Individual Time: 0909-1001 OT Individual Time Calculation (min): 52 min    Patient has met 6 of 6 long term goals due to improved activity tolerance, improved balance, postural control, and improved awareness.  Patient to discharge at overall Modified Independent level, with supervision encouraged for tub/shower transfers due to fluctuating fatigue levels. Patient's care partner is independent to provide the necessary physical and cognitive assistance at discharge.    Recommendation:  Patient will benefit from ongoing skilled OT services in outpatient setting to continue to advance functional skills in the area of iADL, Vocation, and Reduce care partner burden.  Equipment: Suction grab bar and shower chair   Reasons for discharge: discharge from hospital  Patient/family agrees with progress made and goals achieved:   OT Discharge Precautions/Restrictions  Precautions Precautions: Fall;Other (comment) Precaution Comments: Watch BP and RR; Aspiration Restrictions Weight Bearing Restrictions: No Pain Pain Assessment Pain Scale: 0-10 Pain Score: 0-No pain ADL ADL Eating: Modified independent Where Assessed-Eating: Wheelchair Grooming: Modified independent Where Assessed-Grooming: Standing at sink Upper Body Bathing: Modified independent Where Assessed-Upper Body Bathing: Shower Lower Body Bathing: Modified independent Where Assessed-Lower Body Bathing: Shower Upper Body Dressing: Modified independent (Device) Where Assessed-Upper Body Dressing: Edge of bed Lower Body Dressing: Modified independent Where Assessed-Lower Body Dressing: Edge of bed Toileting: Modified independent Where Assessed-Toileting: Teacher, adult education: Engineer, agricultural Method:  Proofreader: Engineer, technical sales: Modified independent Web designer Method: Ship broker: Information systems manager without back Horticulturist, commercial Method: Designer, industrial/product: Sales promotion account executive Baseline Vision/History: 1 Wears glasses Patient Visual Report: No change from baseline Additional Comments: R eye ptosis Perception  Perception: Within Functional Limits Praxis Praxis: Intact Cognition Cognition Overall Cognitive Status: History of cognitive impairments - at baseline Arousal/Alertness: Lethargic Memory: Impaired Memory Impairment: Decreased recall of new information Awareness: Impaired Problem Solving: Impaired Safety/Judgment: Impaired Brief Interview for Mental Status (BIMS) Repetition of Three Words (First Attempt): 3 Temporal Orientation: Year: Correct Temporal Orientation: Month: Accurate within 5 days Temporal Orientation: Day: Incorrect Recall: "Sock": Yes, no cue required Recall: "Blue": Yes, no cue required Recall: "Bed": Yes, after cueing ("a piece of furniture") BIMS Summary Score: 13 Sensation Sensation Light Touch: Appears Intact Hot/Cold: Appears Intact Proprioception: Appears Intact Coordination Gross Motor Movements are Fluid and Coordinated: No Fine Motor Movements are Fluid and Coordinated: No Coordination and Movement Description: Decreased due to MG diagnosis. Motor  Motor Motor: Other (comment) Motor - Skilled Clinical Observations: Increased R-sided weakness due to MG diagnosis Mobility  Transfers Sit to Stand: Independent with assistive device Stand to Sit: Independent with assistive device  Trunk/Postural Assessment  Cervical Assessment Cervical Assessment: Within Functional Limits Thoracic Assessment Thoracic Assessment: Within Functional Limits Lumbar Assessment Lumbar Assessment: Within Functional  Limits Postural Control Postural Control: Within Functional Limits  Balance Balance Balance Assessed: Yes Static Sitting Balance Static Sitting - Balance Support: Feet supported Static Sitting - Level of Assistance: 6: Modified independent (Device/Increase time) Dynamic Sitting Balance Dynamic Sitting - Balance Support: During functional activity Dynamic Sitting - Level of Assistance: 6: Modified independent (Device/Increase time) Dynamic Sitting - Balance Activities: Lateral lean/weight shifting;Forward lean/weight shifting;Reaching for objects Static Standing Balance Static Standing - Balance Support: Bilateral upper extremity supported Static Standing - Level of Assistance: 6: Modified independent (Device/Increase  time) Dynamic Standing Balance Dynamic Standing - Balance Support: During functional activity Dynamic Standing - Level of Assistance: 6: Modified independent (Device/Increase time) Dynamic Standing - Balance Activities: Lateral lean/weight shifting;Forward lean/weight shifting Extremity/Trunk Assessment RUE Assessment RUE Assessment: Exceptions to [facility] General Strength Comments: 3-/5 LUE Assessment LUE Assessment: Exceptions to [facility] General Strength Comments: 3+/5  Pt received resting in bed for skilled OT session with focus on BADL participation, functional transfers, and discharge planning. Pt agreeable to interventions, demonstrating overall pleasant mood. Pt with no reports of pain. OT offering intermediate rest breaks and positioning suggestions throughout session to address potential pain/fatigue and maximize participation/safety in session.   Pt performs full-shower and dressing with levels of assistance noted above. Pt ambulates from room<>ADL apartment, for demonstration of tub-transfer with use of shower seat. Pt performs with overall supervision + BUE on seat and side of tub. Pt, pt's wife, and OT discuss energy conservation in terms of bathing/shower with both  receptive conversation. Pt provided with hand-out for shower seat and suction-cup grab bar.   Pt remained resting in bed with all immediate needs met at end of session. Pt continues to be appropriate for skilled OT intervention to promote further functional independence.   Lou Cal, OTR/L, MSOT  07/16/2022, 8:01 AM

## 2022-07-16 NOTE — Plan of Care (Signed)
  Problem: RH Balance Goal: LTG Patient will maintain dynamic standing with ADLs (OT) Description: LTG:  Patient will maintain dynamic standing balance with assist during activities of daily living (OT)  Outcome: Completed/Met   Problem: RH Bathing Goal: LTG Patient will bathe all body parts with assist levels (OT) Description: LTG: Patient will bathe all body parts with assist levels (OT) Outcome: Completed/Met   Problem: RH Dressing Goal: LTG Patient will perform lower body dressing w/assist (OT) Description: LTG: Patient will perform lower body dressing with assist, with/without cues in positioning using equipment (OT) Outcome: Completed/Met   Problem: RH Toileting Goal: LTG Patient will perform toileting task (3/3 steps) with assistance level (OT) Description: LTG: Patient will perform toileting task (3/3 steps) with assistance level (OT)  Outcome: Completed/Met   Problem: RH Toilet Transfers Goal: LTG Patient will perform toilet transfers w/assist (OT) Description: LTG: Patient will perform toilet transfers with assist, with/without cues using equipment (OT) Outcome: Completed/Met   Problem: RH Tub/Shower Transfers Goal: LTG Patient will perform tub/shower transfers w/assist (OT) Description: LTG: Patient will perform tub/shower transfers with assist, with/without cues using equipment (OT) Outcome: Completed/Met   

## 2022-07-16 NOTE — Progress Notes (Signed)
Pt. Performed >-40 on the NIF and 1.96L on the vital capacity. Good effort.

## 2022-07-16 NOTE — Progress Notes (Addendum)
NIF >-40 cmH20 VC 2.1L  Pt performed with great effort.

## 2022-07-16 NOTE — Progress Notes (Signed)
Physical Therapy Discharge Summary  Patient Details  Name: [name] MRN: [id number] Date of Birth: Nov 24, 1955  Date of Discharge from PT service:July 16, 2022  Today's Date: 07/16/2022 PT Individual Time: 1100-1151 PT Individual Time Calculation (min): 51 min    Patient has met 5 of 6 long term goals due to improved activity tolerance, improved balance, increased strength, increased range of motion, and improved coordination.  Patient to discharge at an ambulatory level Modified Independent.   Patient's spouse participated in family education and demonstrates competency providing necessary assistance.   Reasons goals not met: Unable to meet stair goal as pt continues to require min A for stair navigation without rails.    Equipment: Rollator, Transport chair   Reasons for discharge: treatment goals met and discharge from hospital  Patient/family agrees with progress made and goals achieved: Yes  PT Discharge Pain Interference Pain Interference Pain Effect on Sleep: 0. Does not apply - I have not had any pain or hurting in the past 5 days Pain Interference with Therapy Activities: 0. Does not apply - I have not received rehabilitationtherapy in the past 5 days Pain Interference with Day-to-Day Activities: 1. Rarely or not at all Cognition Overall Cognitive Status: History of cognitive impairments - at baseline Arousal/Alertness: Lethargic Orientation Level: Oriented X4 Memory: Impaired Memory Impairment: Decreased recall of new information Awareness: Impaired Problem Solving: Impaired Safety/Judgment: Impaired Sensation Sensation Light Touch: Appears Intact Hot/Cold: Appears Intact Proprioception: Appears Intact Coordination Gross Motor Movements are Fluid and Coordinated: Yes Fine Motor Movements are Fluid and Coordinated: Yes Coordination and Movement Description: grossly coordinated Finger Nose Finger Test: [facility] Heel Shin Test: [facility] Motor  Motor Motor: Within  Functional Limits Motor - Skilled Clinical Observations: Grossly coordinated and decreased activity tolerance due to MG diagnosis  Mobility Bed Mobility Bed Mobility: Rolling Right;Rolling Left;Supine to Sit;Sit to Supine Rolling Right: Independent Rolling Left: Independent Supine to Sit: Independent Sit to Supine: Independent Transfers Transfers: Sit to Stand;Stand to Sit Sit to Stand: Independent with assistive device Stand to Sit: Independent with assistive device Transfer (Assistive device): Rollator Locomotion  Gait Ambulation: Yes Gait Assistance: Independent with assistive device Gait Distance (Feet): 150 Feet Assistive device: None;4-wheeled walker Gait Gait: Yes Gait Pattern: Impaired Gait Pattern: Decreased stride length;Shuffle Gait velocity: decreased Stairs / Additional Locomotion Stairs: Yes Stairs Assistance: Minimal Assistance - Patient > 75% Stair Management Technique: No rails Number of Stairs: 4 Height of Stairs: 6 Ramp: Independent with assistive device Curb: Minimal Assistance - Patient >75% Wheelchair Mobility Wheelchair Mobility: No  Trunk/Postural Assessment  Cervical Assessment Cervical Assessment: Exceptions to [facility] (forward head) Thoracic Assessment Thoracic Assessment: Exceptions to [facility] (rounded shoulders) Lumbar Assessment Lumbar Assessment: Within Functional Limits Postural Control Postural Control: Within Functional Limits  Balance Balance Balance Assessed: Yes Static Sitting Balance Static Sitting - Balance Support: Feet supported Static Sitting - Level of Assistance: 6: Modified independent (Device/Increase time) Dynamic Sitting Balance Dynamic Sitting - Balance Support: During functional activity Dynamic Sitting - Level of Assistance: 6: Modified independent (Device/Increase time) Dynamic Sitting - Balance Activities: Lateral lean/weight shifting;Forward lean/weight shifting;Reaching for Higher education careers adviser  Standing - Balance Support: Bilateral upper extremity supported Static Standing - Level of Assistance: 6: Modified independent (Device/Increase time) Dynamic Standing Balance Dynamic Standing - Balance Support: During functional activity Dynamic Standing - Level of Assistance: 6: Modified independent (Device/Increase time) Dynamic Standing - Balance Activities: Lateral lean/weight shifting;Forward lean/weight shifting Extremity Assessment  RUE Assessment RUE Assessment: Exceptions to [facility] General Strength Comments:  3-/5 LUE Assessment LUE Assessment: Exceptions to [facility] General Strength Comments: 3+/5 RLE Assessment RLE Assessment: Within Functional Limits General Strength Comments: Grossly 4+/5 LLE Assessment LLE Assessment: Within Functional Limits General Strength Comments: Grossly 4+/5   Daily PT Session:   Pt received semi-reclined in bed with spouse and daughter present. Pt agreeable to PT session and without reports of pain in session. Pt mod I with ambulation, no AD ~150 ft from room>ortho gym>main gym. Pt with decreased step length and shuffle gait and unable to correct with verbal cues. PT assessed pain interference, sensation, coordination, strength and balance in preparation for discharge. Pt performed following exercises to improve activity tolerance, strength and coordination:   -squats with OH press with 1.1# weighted ball 3 x 5   -alternating marches while holding 1.1 # weighted ball 3 x 5  Pt ambulated to room mod I and left seated at bedside with all needs in reach and alarm on.   Truitt Leep Truitt Leep PT, DPT  07/16/2022, 11:21 AM

## 2022-07-16 NOTE — Progress Notes (Signed)
PROGRESS NOTE   Subjective/Complaints:  Patient lying in bed this morning.  No new concerns or complaints ordered.  ROS: Denies fever, N/V, abdominal pain, constipation, diarrhea, SOB, chest pain, HA , vision changes, new weakness or paraesthesias.   + R ptosis-fluctuating  Objective:   No results found. Recent Labs    07/15/22 1022  WBC 6.6  HGB 8.3*  HCT 25.8*  PLT 399    Recent Labs    07/14/22 1647 07/15/22 1022  NA 131* 132*  K 4.0 3.6  CL 99 98  CO2 23 25  GLUCOSE 132* 126*  BUN 18 14  CREATININE 1.03 1.06  CALCIUM 8.9 8.6*     Intake/Output Summary (Last 24 hours) at 07/16/2022 0956 Last data filed at 07/16/2022 0600 Gross per 24 hour  Intake 400 ml  Output 975 ml  Net -575 ml         Physical Exam: Vital Signs Blood pressure 101/62, pulse 81, temperature 97.9 F (36.6 C), temperature source Oral, resp. rate 18, height 5\' 5"  (1.651 m), weight 47.9 kg, SpO2 99 %.    Constitutional: No apparent distress.  Lying in bed, appears comfortable HENT: No JVD. Neck Supple. Trachea midline. Atraumatic, normocephalic.  + R eye ptosis Cardiovascular: RRR Respiratory: CTAB.Good air movement Abdomen: + bowel sounds, normoactive. No distention or tenderness.  GU: Not examined Skin: Warm and dry, ecchymotic area LLQ and multiple small bruises from heparin injection.    MSK:      No apparent deformity.                RUE: 5-/5 throughout                LUE: 5-/5 throughout-improved                RLE: 4+/5 throughout-improved                LLE:  4+/5 throughout-improved   Neurologic exam:  Cognition: AAO to person, place, time.  No apparent cognitive deficits Insight: Good insight into current condition.  Mood: Pleasant.  Appropriate affect.  Sensation: To light touch intact in BL UEs and LEs  Reflexes: 1+ in BL UE and LEs. Negative Hoffman's and babinski signs bilaterally.  CN: + R ptosis  improved. + mild R facial droop-unchanged Coordination: Bilateral upper extremity tremors with antigravity range of motion  spasticity: MAS 0 in all extremities.     Assessment/Plan: 1. Functional deficits which require 3+ hours per day of interdisciplinary therapy in a comprehensive inpatient rehab setting. Physiatrist is providing close team supervision and 24 hour management of active medical problems listed below. Physiatrist and rehab team continue to assess barriers to discharge/monitor patient progress toward functional and medical goals  Care Tool:  Bathing    Body parts bathed by patient: Right arm, Left arm, Chest, Abdomen, Front perineal area, Buttocks, Right upper leg, Left upper leg, Right lower leg, Left lower leg, Face         Bathing assist Assist Level: Contact Guard/Touching assist     Upper Body Dressing/Undressing Upper body dressing   What is the patient wearing?: Pull over shirt    Upper  body assist Assist Level: Set up assist    Lower Body Dressing/Undressing Lower body dressing      What is the patient wearing?: Pants     Lower body assist Assist for lower body dressing: Minimal Assistance - Patient > 75%     Toileting Toileting Toileting Activity did not occur (Clothing management and hygiene only): N/A (no void or bm)  Toileting assist Assist for toileting: Contact Guard/Touching assist     Transfers Chair/bed transfer  Transfers assist     Chair/bed transfer assist level: Minimal Assistance - Patient > 75%     Locomotion Ambulation   Ambulation assist      Assist level: Minimal Assistance - Patient > 75% Assistive device: Walker-rolling Max distance: 50   Walk 10 feet activity   Assist     Assist level: Minimal Assistance - Patient > 75% Assistive device: Walker-rolling   Walk 50 feet activity   Assist    Assist level: Minimal Assistance - Patient > 75% Assistive device: Walker-rolling    Walk 150 feet  activity   Assist Walk 150 feet activity did not occur: Safety/medical concerns (2/2 fatigue)         Walk 10 feet on uneven surface  activity   Assist     Assist level: Minimal Assistance - Patient > 75% Assistive device: Walker-rolling   Wheelchair     Assist Is the patient using a wheelchair?: No   Wheelchair activity did not occur: N/A         Wheelchair 50 feet with 2 turns activity    Assist    Wheelchair 50 feet with 2 turns activity did not occur: N/A       Wheelchair 150 feet activity     Assist  Wheelchair 150 feet activity did not occur: N/A       Blood pressure 101/62, pulse 81, temperature 97.9 F (36.6 C), temperature source Oral, resp. rate 18, height 5\' 5"  (1.651 m), weight 47.9 kg, SpO2 99 %.  Medical Problem List and Plan: 1. Functional deficits secondary to myasthenia gravis flare             -patient may shower             -ELOS/Goals: 18-21 days, Min A PT/OT/SLP             -Appreciate hospitalist discharge notation: Medications that may worsen or trigger MG exacerbation: Class IA antiarrhythmics, magnesium, flouroquinolones, macrolides, aminoglycosides, penicillamine, curare, interferon alpha, botox, quinine. Use with caution: calcium channel blockers, beta blockers and statins.   -Est discharge 6/17  -Limit session duration, do no work to exhaustion, asked therapy to schedule shorter sessions with more rest breaks     2.  Antithrombotics: -DVT/anticoagulation:  Pharmaceutical: Lovenox-> SCDs 6-9, see below             -antiplatelet therapy: N/A   3. Pain Management: Tylenol prn.    4. Mood/Behavior/Sleep: Uses Ambien 10 mg as needed at home. LCSW to follow for evaluation and support. Team to provide ego support.              -antipsychotic agents: N/A             --Melatonin prn for insomnia.    5. Neuropsych/cognition: This patient is capable of making decisions on his own behalf. 6. Skin/Wound Care: Routine  pressure relief measures 7. Fluids/Electrolytes/Nutrition/dysphagia: Monitor I/O. Change tube feed to 60  cc/hr X 10 hours w/H2O qid.  --start Calorie count  tomorrow. Wife encouraged to bring soft foods from home.               --downgrade diet to D2--reports feeling food sticking in throat/throat discomfort from intubations             --MMW added.   -6/9: He is gradually improving, however remain inadequate.  Wait for nutrition This week, should be able to remove core track of p.o.'s improvement next few days.  Added Cepacol lozenge for throat discomfort.  -6/10 appears to be eating more, consider remove cor track in new few days  6/11 50-65 % of meals eaten, pt to work with SLP today, continue cor track now now given pt reports of some concerns about swallow this AM, SLP to eval this AM  6/12 remove cor trak today  6/16 p.o. intake appears fair, 25 to 95%, continue Ensure   8. MG flare w/ respiratory failure: Has completed course of IVIG.  --Continue to monitor NIF/VC--> NIF -40 VC-1.2 L this am.              --Continue home regimen of Mestinon and Prednisone.   -6/12 NIF appears decreased today, will ask respiratory to recheck, if continues to be low will contract neurology   -6/13 NIF went up to -40 yesterday, 25 this AM, continue to monitor, contact neurology  -6/14, Seen by neurology yesterday, prednisone increased to 15mg  daily, appreciate assistance  -6/16 labs remained stable, continue to monitor   9. Anxiety: Continue Klonopin, Paxil and prn Xanax. Continue to provide ego support.  --Patient frustrated/expressing disappointment that he has not rebounded as in the past. Continue to encourage/provide support.  -- Would benefit from neuropsych evaluation --6-8: Encouraged family regarding patient's low energy, attributable to current MG flare in addition to medications required for mood control.  Will add BuSpar 5 mg twice daily, wean Klonopin to 0.5 mg twice daily if tolerated. -6/9:  Anxiety improving, has not required as needed Xanax the last 2 days.  Monitor tonight, consider DC scheduled Klonopin in the a.m. if he remains stable 6/10 one use of xanax last night, overall anxiety improved 6/12 one dose xanax use, overall stable, continue to monitor   6/13 continues to have intermittent anxiety, will need to be careful to distinguish episodes from respiratory worsening  6/15 using Xanax about daily, stable continue to monitor   10. T2DM: Hgb A1c--5.7 and well controlled. Will monitor BS ac/hs and use SSI for elevated BS             --continue to hold Metformin and Januvia (discussed need to increase intake w/patient)  -- 6-8: Elevated, eating 25 to 50% of meals.  Continue monitoring, consider addition of Semglee or resuming metformin when appropriate.  -6/11 restart metformin 500mg  BID (home dose 1000mg  BID)  -6/16 controlled overall continue metformin  Recent Labs    07/15/22 1655 07/15/22 2101 07/16/22 0602  GLUCAP 233* 76 119*        11. Hep B: On Descovy as substitute-->wife to bring Entecavir from home.    12. Hypotension/Tachycardia: Ongoing for weeks. Inpatient workup including CTA chest and lower extremity venous duplex negative  - Monitor for any signs of decline in his NIF or vital capacity, if any worsening discontinue Cardizem.   -6/16 heart rate and BP stable, continue monitor    07/16/2022    4:55 AM 07/16/2022    4:28 AM 07/15/2022    7:43 PM  Vitals with BMI  Weight 105 lbs 10  oz    BMI 17.57    Systolic  101 111  Diastolic  62 71  Pulse  81 94    13. Anemia - HgB 7.9 -> 7.3 -> 7.0 6/8. Stool card pending. Recheck h/h 6/9, hold lovenox, start SCDs. if below 7 will transfuse.  6/9: H&H 7.4; monitor with a.m. labs.  Only obvious cause at this time is Lovenox so we will continue to hold with SCDs; mobility is improving  6/10 HGB up to 7.8, continue to monitor   -6/16 Hgb up to 8.3, improved  14.  Hyponatremia  -6/15 NA stable 132  LOS: 9  days A FACE TO FACE EVALUATION WAS PERFORMED  Fanny Dance 07/16/2022, 9:56 AM

## 2022-07-17 ENCOUNTER — Encounter (HOSPITAL_COMMUNITY): Payer: Self-pay | Admitting: Physical Medicine and Rehabilitation

## 2022-07-17 ENCOUNTER — Other Ambulatory Visit (HOSPITAL_COMMUNITY): Payer: Self-pay

## 2022-07-17 LAB — GLUCOSE, CAPILLARY
Glucose-Capillary: 108 mg/dL — ABNORMAL HIGH (ref 70–99)
Glucose-Capillary: 133 mg/dL — ABNORMAL HIGH (ref 70–99)
Glucose-Capillary: 165 mg/dL — ABNORMAL HIGH (ref 70–99)
Glucose-Capillary: 89 mg/dL (ref 70–99)

## 2022-07-17 LAB — CBC
HCT: 23.4 % — ABNORMAL LOW (ref 39.0–52.0)
HCT: 26.1 % — ABNORMAL LOW (ref 39.0–52.0)
Hemoglobin: 7.3 g/dL — ABNORMAL LOW (ref 13.0–17.0)
Hemoglobin: 8.4 g/dL — ABNORMAL LOW (ref 13.0–17.0)
MCH: 26.8 pg (ref 26.0–34.0)
MCH: 28.4 pg (ref 26.0–34.0)
MCHC: 31.2 g/dL (ref 30.0–36.0)
MCHC: 32.2 g/dL (ref 30.0–36.0)
MCV: 86 fL (ref 80.0–100.0)
MCV: 88.2 fL (ref 80.0–100.0)
Platelets: 353 10*3/uL (ref 150–400)
Platelets: 380 10*3/uL (ref 150–400)
RBC: 2.72 MIL/uL — ABNORMAL LOW (ref 4.22–5.81)
RBC: 2.96 MIL/uL — ABNORMAL LOW (ref 4.22–5.81)
RDW: 14.7 % (ref 11.5–15.5)
RDW: 14.6 % (ref 11.5–15.5)
WBC: 5.9 10*3/uL (ref 4.0–10.5)
WBC: 5.1 10*3/uL (ref 4.0–10.5)
nRBC: 0 % (ref 0.0–0.2)
nRBC: 0 % (ref 0.0–0.2)

## 2022-07-17 LAB — OCCULT BLOOD X 1 CARD TO LAB, STOOL
Fecal Occult Bld: NEGATIVE
Fecal Occult Bld: NEGATIVE

## 2022-07-17 MED ORDER — BUSPIRONE HCL 5 MG PO TABS
5.0000 mg | ORAL_TABLET | Freq: Three times a day (TID) | ORAL | 0 refills | Status: DC
  Filled 2022-07-17: qty 90, 30d supply, fill #0

## 2022-07-17 MED ORDER — BUSPIRONE HCL 10 MG PO TABS
5.0000 mg | ORAL_TABLET | Freq: Three times a day (TID) | ORAL | Status: DC
  Administered 2022-07-17 – 2022-07-18 (×4): 5 mg via ORAL
  Filled 2022-07-17 (×4): qty 1

## 2022-07-17 MED ORDER — ADULT MULTIVITAMIN W/MINERALS CH
1.0000 | ORAL_TABLET | Freq: Every day | ORAL | 0 refills | Status: AC
  Filled 2022-07-17: qty 100, 100d supply, fill #0

## 2022-07-17 MED ORDER — ALPRAZOLAM 0.5 MG PO TABS
0.5000 mg | ORAL_TABLET | Freq: Every day | ORAL | 0 refills | Status: DC | PRN
  Filled 2022-07-17: qty 30, 30d supply, fill #0

## 2022-07-17 MED ORDER — DILTIAZEM HCL 90 MG PO TABS
45.0000 mg | ORAL_TABLET | Freq: Three times a day (TID) | ORAL | 0 refills | Status: DC
  Filled 2022-07-17: qty 45, 30d supply, fill #0

## 2022-07-17 MED ORDER — DILTIAZEM HCL 30 MG PO TABS
45.0000 mg | ORAL_TABLET | Freq: Three times a day (TID) | ORAL | Status: DC
  Administered 2022-07-17 – 2022-07-18 (×2): 45 mg via ORAL
  Filled 2022-07-17 (×2): qty 2

## 2022-07-17 MED ORDER — CLONAZEPAM 0.5 MG PO TABS
0.5000 mg | ORAL_TABLET | Freq: Three times a day (TID) | ORAL | 0 refills | Status: DC
  Filled 2022-07-17: qty 90, 30d supply, fill #0

## 2022-07-17 MED ORDER — ALPRAZOLAM 0.5 MG PO TABS
0.5000 mg | ORAL_TABLET | Freq: Every evening | ORAL | Status: DC | PRN
  Administered 2022-07-18: 0.5 mg via ORAL
  Filled 2022-07-17: qty 1

## 2022-07-17 MED ORDER — CLONAZEPAM 0.5 MG PO TABS
0.5000 mg | ORAL_TABLET | Freq: Two times a day (BID) | ORAL | 0 refills | Status: DC
  Filled 2022-07-17: qty 60, 30d supply, fill #0

## 2022-07-17 NOTE — Progress Notes (Addendum)
PROGRESS NOTE   Subjective/Complaints:  Pt lying in bed- reports ptosis fluctuating but has been that way.  LBM last night- urinating OK Denies pain.  ROS:    Pt denies SOB, abd pain, CP, N/V/C/D, and vision changes  Except for HPI  Objective:   No results found. Recent Labs    07/15/22 1022 07/17/22 0550  WBC 6.6 5.9  HGB 8.3* 7.3*  HCT 25.8* 23.4*  PLT 399 353   Recent Labs    07/14/22 1647 07/15/22 1022  NA 131* 132*  K 4.0 3.6  CL 99 98  CO2 23 25  GLUCOSE 132* 126*  BUN 18 14  CREATININE 1.03 1.06  CALCIUM 8.9 8.6*    Intake/Output Summary (Last 24 hours) at 07/17/2022 0826 Last data filed at 07/17/2022 1610 Gross per 24 hour  Intake 558 ml  Output 1525 ml  Net -967 ml        Physical Exam: Vital Signs Blood pressure 99/66, pulse 82, temperature 98.9 F (37.2 C), temperature source Oral, resp. rate 18, height 5\' 5"  (1.651 m), weight 47.9 kg, SpO2 100 %.     General: awake, alert, appropriate, supine in bed; family member in bedside chair, sleeping; NAD HENT: conjugate gaze;  R eye ptosis (+)- notable; oropharynx moist CV: regular rate and rhythm; no JVD Pulmonary: CTA B/L; no W/R/R- good air movement- GI: soft, NT, ND, (+)BS- normoactive Psychiatric: appropriate Neurological: Ox3 .  GU: Not examined Skin: Warm and dry, ecchymotic area LLQ and multiple small bruises from heparin injection.    MSK:      No apparent deformity.                RUE: 5-/5 throughout                LUE: 5-/5 throughout-improved                RLE: 4+/5 throughout-improved                LLE:  4+/5 throughout-improved   Neurologic exam:  Cognition: AAO to person, place, time.  No apparent cognitive deficits Insight: Good insight into current condition.  Mood: Pleasant.  Appropriate affect.  Sensation: To light touch intact in BL UEs and LEs  Reflexes: 1+ in BL UE and LEs. Negative Hoffman's and babinski  signs bilaterally.  CN: + R ptosis improved. + mild R facial droop-unchanged Coordination: Bilateral upper extremity tremors with antigravity range of motion  spasticity: MAS 0 in all extremities.     Assessment/Plan: 1. Functional deficits which require 3+ hours per day of interdisciplinary therapy in a comprehensive inpatient rehab setting. Physiatrist is providing close team supervision and 24 hour management of active medical problems listed below. Physiatrist and rehab team continue to assess barriers to discharge/monitor patient progress toward functional and medical goals  Care Tool:  Bathing    Body parts bathed by patient: Right arm, Left arm, Chest, Abdomen, Front perineal area, Buttocks, Right upper leg, Left upper leg, Right lower leg, Left lower leg, Face         Bathing assist Assist Level: Independent with assistive device     Upper Body  Dressing/Undressing Upper body dressing   What is the patient wearing?: Pull over shirt    Upper body assist Assist Level: Independent with assistive device    Lower Body Dressing/Undressing Lower body dressing      What is the patient wearing?: Pants, Underwear/pull up     Lower body assist Assist for lower body dressing: Independent with assitive device     Toileting Toileting Toileting Activity did not occur (Clothing management and hygiene only): N/A (no void or bm)  Toileting assist Assist for toileting: Independent with assistive device     Transfers Chair/bed transfer  Transfers assist     Chair/bed transfer assist level: Independent with assistive device     Locomotion Ambulation   Ambulation assist      Assist level: Independent with assistive device Assistive device: Rollator Max distance: 150 ft   Walk 10 feet activity   Assist     Assist level: Independent with assistive device Assistive device: Rollator   Walk 50 feet activity   Assist    Assist level: Independent with  assistive device Assistive device: Rollator    Walk 150 feet activity   Assist Walk 150 feet activity did not occur: Safety/medical concerns (2/2 fatigue)  Assist level: Independent with assistive device Assistive device: Rollator    Walk 10 feet on uneven surface  activity   Assist     Assist level: Independent with assistive device Assistive device: Rollator   Wheelchair     Assist Is the patient using a wheelchair?: No   Wheelchair activity did not occur: N/A         Wheelchair 50 feet with 2 turns activity    Assist    Wheelchair 50 feet with 2 turns activity did not occur: N/A       Wheelchair 150 feet activity     Assist  Wheelchair 150 feet activity did not occur: N/A       Blood pressure 99/66, pulse 82, temperature 98.9 F (37.2 C), temperature source Oral, resp. rate 18, height 5\' 5"  (1.651 m), weight 47.9 kg, SpO2 100 %.  Medical Problem List and Plan: 1. Functional deficits secondary to myasthenia gravis flare             -patient may shower             -ELOS/Goals: 18-21 days, Min A PT/OT/SLP             -Appreciate hospitalist discharge notation: Medications that may worsen or trigger MG exacerbation: Class IA antiarrhythmics, magnesium, flouroquinolones, macrolides, aminoglycosides, penicillamine, curare, interferon alpha, botox, quinine. Use with caution: calcium channel blockers, beta blockers and statins.   -Est discharge 6/17  -Limit session duration, do no work to exhaustion, asked therapy to schedule shorter sessions with more rest breaks  Con't CIR PT and OT    2.  Antithrombotics: -DVT/anticoagulation:  Pharmaceutical: Lovenox-> SCDs 6-9, see below 6/17- Hb down to 7.3- will recheck in Am  and see if improves             -antiplatelet therapy: N/A   3. Pain Management: Tylenol prn.    4. Mood/Behavior/Sleep: Uses Ambien 10 mg as needed at home. LCSW to follow for evaluation and support. Team to provide ego support.               -antipsychotic agents: N/A             --Melatonin prn for insomnia.    5. Neuropsych/cognition: This patient is  capable of making decisions on his own behalf. 6. Skin/Wound Care: Routine pressure relief measures 7. Fluids/Electrolytes/Nutrition/dysphagia: Monitor I/O. Change tube feed to 60  cc/hr X 10 hours w/H2O qid.  --start Calorie count tomorrow. Wife encouraged to bring soft foods from home.               --downgrade diet to D2--reports feeling food sticking in throat/throat discomfort from intubations             --MMW added.   -6/9: He is gradually improving, however remain inadequate.  Wait for nutrition This week, should be able to remove core track of p.o.'s improvement next few days.  Added Cepacol lozenge for throat discomfort.  -6/10 appears to be eating more, consider remove cor track in new few days  6/11 50-65 % of meals eaten, pt to work with SLP today, continue cor track now now given pt reports of some concerns about swallow this AM, SLP to eval this AM  6/12 remove cor trak today  6/16 p.o. intake appears fair, 25 to 95%, continue Ensure   8. MG flare w/ respiratory failure: Has completed course of IVIG.  --Continue to monitor NIF/VC--> NIF -40 VC-1.2 L this am.              --Continue home regimen of Mestinon and Prednisone.   -6/12 NIF appears decreased today, will ask respiratory to recheck, if continues to be low will contract neurology   -6/13 NIF went up to -40 yesterday, 25 this AM, continue to monitor, contact neurology  -6/14, Seen by neurology yesterday, prednisone increased to 15mg  daily, appreciate assistance  -6/16 labs remained stable, continue to monitor   6/17- NIF -30 and VC 1.8- VC has gone down slightly this AM, but if still decreasing tomorrow, will call Neuro.  9. Anxiety: Continue Klonopin, Paxil and prn Xanax. Continue to provide ego support.  --Patient frustrated/expressing disappointment that he has not rebounded as in the past.  Continue to encourage/provide support.  -- Would benefit from neuropsych evaluation --6-8: Encouraged family regarding patient's low energy, attributable to current MG flare in addition to medications required for mood control.  Will add BuSpar 5 mg twice daily, wean Klonopin to 0.5 mg twice daily if tolerated. -6/9: Anxiety improving, has not required as needed Xanax the last 2 days.  Monitor tonight, consider DC scheduled Klonopin in the a.m. if he remains stable 6/10 one use of xanax last night, overall anxiety improved 6/12 one dose xanax use, overall stable, continue to monitor   6/13 continues to have intermittent anxiety, will need to be careful to distinguish episodes from respiratory worsening  6/15 using Xanax about daily, stable continue to monitor  6/17- using Xanax daily per pt- will con't to monitor closely to make sure anxiety isn't decline in function 10. T2DM: Hgb A1c--5.7 and well controlled. Will monitor BS ac/hs and use SSI for elevated BS             --continue to hold Metformin and Januvia (discussed need to increase intake w/patient)  -- 6-8: Elevated, eating 25 to 50% of meals.  Continue monitoring, consider addition of Semglee or resuming metformin when appropriate.  -6/11 restart metformin 500mg  BID (home dose 1000mg  BID)  -6/16 controlled overall continue metformin  6/17- CBGs look great- con't current Metformin dose Recent Labs    07/16/22 1712 07/16/22 2106 07/17/22 0608  GLUCAP 126* 108* 108*       11. Hep B: On Descovy as substitute-->wife to  bring Entecavir from home.    12. Hypotension/Tachycardia: Ongoing for weeks. Inpatient workup including CTA chest and lower extremity venous duplex negative  - Monitor for any signs of decline in his NIF or vital capacity, if any worsening discontinue Cardizem.   -6/16 heart rate and BP stable, continue monitor    07/17/2022    5:42 AM 07/17/2022    3:45 AM 07/16/2022    7:12 PM  Vitals with BMI  Weight 105 lbs 10  oz    BMI 17.57    Systolic  99 101  Diastolic  66 58  Pulse  82 83    13. Anemia - HgB 7.9 -> 7.3 -> 7.0 6/8. Stool card pending. Recheck h/h 6/9, hold lovenox, start SCDs. if below 7 will transfuse.  6/9: H&H 7.4; monitor with a.m. labs.  Only obvious cause at this time is Lovenox so we will continue to hold with SCDs; mobility is improving  6/10 HGB up to 7.8, continue to monitor   -6/16 Hgb up to 8.3, improved  6/17- Hb down to 7.3- not clear why- doesn't have any signs of bleeding- will recheck in AM and also check BMP to see if because less dry? 14.  Hyponatremia  -6/15 NA stable 132   /I spent a total of 41   minutes on total care today- >50% coordination of care- due to review of chart, and d/w PA about NIF and VC and low Hb.     LOS: 10 days A FACE TO FACE EVALUATION WAS PERFORMED  Kasidy Gianino 07/17/2022, 8:26 AM

## 2022-07-17 NOTE — Progress Notes (Signed)
Patient with low BP last night and again this am. Reports fatigue which has been ongoing. Discussed blood counts and that anemia could be causing symptoms. Will recheck CBC this afternoon if still in low 7's, patient and family agreeable for one unit PRBC for transfusion. Will also adjust Cardizem dose

## 2022-07-17 NOTE — Progress Notes (Signed)
Inpatient Rehabilitation Discharge Medication Review by a Pharmacist  A complete drug regimen review was completed for this patient to identify any potential clinically significant medication issues.  High Risk Drug Classes Is patient taking? Indication by Medication  Antipsychotic No   Anticoagulant No   Antibiotic No   Opioid No   Antiplatelet No   Hypoglycemics/insulin Yes Metformin- T2DM  Vasoactive Medication Yes Diltiazem- rate control/HTN  Chemotherapy No   Other Yes Mestinon- MG Paxil- MDD / anxiety Baraclude- Hep B Xanax- anxiety Protonix- GERD Prednisone- MG flare Klonopin- anxiety Buspar- anxiety     Type of Medication Issue Identified Description of Issue Recommendation(s)  Drug Interaction(s) (clinically significant)     Duplicate Therapy     Allergy     No Medication Administration End Date     Incorrect Dose     Additional Drug Therapy Needed     Significant med changes from prior encounter (inform family/care partners about these prior to discharge).    Other       Clinically significant medication issues were identified that warrant physician communication and completion of prescribed/recommended actions by midnight of the next day:  No   Time spent performing this drug regimen review (minutes):  30   Joselin Crandell BS, PharmD, BCPS Clinical Pharmacist 07/17/2022 9:26 AM  Contact: [phone] after 3 PM  "Be curious, not judgmental..." -Debbora Dus

## 2022-07-17 NOTE — Progress Notes (Signed)
Updated patient that blood counts are better--likely a lab variation. Will hope to get another stool sample and recheck CBC in am. BP continue to be low -->will decrease Cardizem to 45 mg TID (not d/c) as HR still occasionally up to 106 in the past 24 hours. Can be weaned off after discharge.

## 2022-07-17 NOTE — Progress Notes (Signed)
NiF -25 Poor technique; pt required multiple attempts w/instruction

## 2022-07-17 NOTE — Progress Notes (Signed)
Inpatient Rehabilitation Care Coordinator Discharge Note   Patient Details  Name: [name] MRN: [id number] Date of Birth: Jun 27, 1955   Discharge location: HOME WITH WIFE AND TOW CHILDREN TO ASSIST AWARE WILL NEED 24/7 CARE  Length of Stay: 11 days  Discharge activity level: SUPERVISION-MOD/I LEVEL  Home/community participation: ACTIVE  Patient response [id number] Literacy - How often do you need to have someone help you when you read instructions, pamphlets, or other written material from your doctor or pharmacy?: Never  Patient response [id number] Isolation - How often do you feel lonely or isolated from those around you?: Never  Services provided included: MD, RD, PT, OT, SLP, RN, CM, TR, Pharmacy, SW  Financial Services:  Financial Services Utilized: Medicare    Choices offered to/list presented to: PT AND WIFE  Follow-up services arranged:  Home Health, DME, Patient/Family has no preference for HH/DME agencies Home Health Agency: [facility]  PT  OT  SP RN    DME : [facility] ONCE PAY THE [employer] CAN GET EQUIPMENT PT AND FAMILY PLAN TO GO TO THE RETAIL STORE TO GET.    Patient response to transportation need: Is the patient able to respond to transportation needs?: Yes In the past 12 months, has lack of transportation kept you from medical appointments or from getting medications?: No In the past 12 months, has lack of transportation kept you from meetings, work, or from getting things needed for daily living?: No   Patient/Family verbalized understanding of follow-up arrangements:  Yes  Individual responsible for coordination of the follow-up plan: [facility] [phone]  Confirmed correct DME delivered: Lucy Chris 07/17/2022    Comments (or additional information): wife and two children were here daily and assisted with care. Feel prepared for discharge home today  Summary of Stay    Date/Time Discharge Planning  CSW  07/10/22 1123 Home with wife who will be his primary caregiver and two children are involved also. Pt hopeful to do well here and regain his independece and increase his oral intake RGD       Ikenna Ohms, Lemar Livings

## 2022-07-17 NOTE — Progress Notes (Signed)
Patient ID: [name], male   DOB: [birth year], 67 y.o.   MRN: [id number]  Informed by Pam-PA pt is not discharging today due to hemoglobin issue. Have sent out email regarding this

## 2022-07-17 NOTE — Progress Notes (Signed)
Pt performed A NIF and VC with great effort. NIF -30 VC 1.8L

## 2022-07-17 NOTE — Discharge Summary (Signed)
Physician Discharge Summary  Patient ID: [name] MRN: [id number] DOB/AGE: 66-13-1957 66 y.o.  Admit date: 07/07/2022 Discharge date: 07/18/2022  Discharge Diagnoses:  Principal Problem:   Myasthenia exacerbation (HCC) Active Problems:   Type 2 diabetes mellitus with other specified complication (HCC)   Vitamin D deficiency   Anxiety disorder, unspecified   Major depressive disorder, recurrent episode (HCC)   GERD   Hypertension, essential, benign   Protein-calorie malnutrition, severe   Discharged Condition: good  Significant Diagnostic Studies: DG Chest 2 View  Result Date: 07/18/2022 CLINICAL DATA:  Shortness of breath EXAM: CHEST - 2 VIEW COMPARISON:  Chest radiograph 07/05/2022, CTA chest 07/06/2022 FINDINGS: Median sternotomy wires and mediastinal surgical clips are again noted. The cardiomediastinal silhouette is normal There is no focal consolidation or pulmonary edema. There is no pleural effusion or pneumothorax There is no acute osseous abnormality. IMPRESSION: No radiographic evidence of acute cardiopulmonary process. Electronically Signed   By: Lesia Hausen M.D.   On: 07/18/2022 12:09    Labs:  Basic Metabolic Panel: Recent Labs  Lab 07/14/22 1647 07/15/22 1022 07/18/22 0705  NA 131* 132* 133*  K 4.0 3.6 3.4*  CL 99 98 98  CO2 23 25 25   GLUCOSE 132* 126* 188*  BUN 18 14 11   CREATININE 1.03 1.06 1.04  CALCIUM 8.9 8.6* 8.6*    CBC: Recent Labs  Lab 07/17/22 0550 07/17/22 1345 07/18/22 0705  WBC 5.9 5.1 6.5  NEUTROABS  --   --  2.5  HGB 7.3* 8.4* 8.5*  HCT 23.4* 26.1* 27.0*  MCV 86.0 88.2 86.0  PLT 353 380 365    CBG: Recent Labs  Lab 07/17/22 1125 07/17/22 1625 07/17/22 2104 07/18/22 0631 07/18/22 1212  GLUCAP 133* 165* 89 102* 259*    Brief HPI:   [name] is a 67 y.o. male with history of T2DM, Hep B, HTN, anxiety d/o. MG who developed RUE weakness with difficulty swallowing, head droop and SOB due to MG flare. He was treated  with 3 day course of IVIG and d/c to home  06/19/22 but readmitted on 06/21/22 with difficulty swallowing, SOB and difficulty walking due to DOE. He was tachypnic at admission with RR 34, NIF -19 and was intubated in ED. CTA chest negative for PE but found to have incidental dilatation of aorta. 2 D echo showed EF 60-65% with mild concentric LVH. He was extubated but failed trials of BIPAP and required reintubation. He was treated with 5 day course of IVIG. Hospital course significant for failure of extubation with reintubation X 2, episodes of SVT, hypotension as well as RUE weakness with negative brain MRI.   He was finally extubated to 4 L oxygen on 06/29/22 but continued to have tachycardia with copious secretions. Chest PT added but caused anxiety as well as confusion. Cardizem added for rate control as well as anxiolytics as elevated HR felt to be anxiety mediated. He was placed on D3 diet but had poor intake due to feeling of chocking as well as sore throat therefore tube feeds maintained. CTA chest repeated 06/06 and was negative for PE. Paxil added to Klonopin, buspar and prn Xanax. Patient continued to be limited by weakness with balance deficits, poor safety awareness and anxiety. CIR was recommended due to functional decline.    Hospital Course: [name] was admitted to rehab 07/07/2022 for inpatient therapies to consist of PT, ST and OT at least three hours five days a week. Past admission physiatrist, therapy team and rehab  RN have worked together to provide customized collaborative inpatient rehab. His blood pressures were monitored on TID basis and were noted to be trending down 1-2 days prior to discharge. Heart rate has been in 80-90 range. Cardizem was decreased to 45 mg TID without recurrent tachycardia. He was kept on tube feeds at nights till intake improved. Diet was downgraded to D1 to decrease effort with intake and cortak removed by 06/12. Diet was advanced to D2 prior to  discharge.   His diabetes has been monitored with ac/hs CBG checks and SSI was use prn for tighter BS control. Metformin was resumed on 06/11 as BS started trending upwards and are currently controlled overall. Follow up labs showed H/H to be relatively stable in 7-8 range without signs of bleeding. Check of lytes showed mild hyponatremia with mild hypokalemia on day of discharge. He was supplemented with Kdur 40 meq and advised on intake of high K+ foods. Recommend repeat check BMET in 5-7 days to monitor for stability.   His NIF and VC has been monitored during his stay. He was noted to have  NIF-40 with VC 1.0 at admission and has fluctuated depending on timing of study. Dr. Amada Jupiter was consulted due to concerns of weakness with difficulty swallowing and fatigue. He recommended increasing prednisone to 15 mg daily given his persistent ptosis and to continue on this till follow up with outpatient neurologist.  He did continue to have anxiety especially at nights with reports of dyspnea. Klonopin was decreased to 0.5 mg BID at admission but he was noted to have increase in Xanax use prior to discharge.  Buspar was increased to TID and Xanax was decreased to daily prn at nights to help manage insomnia/anxiety at nights.   Pulmonary was consulted for input prior to discharge and CXR was NAD. He was advised to continue pulmonary toilet and pace himself thorough out the day. Right ptosis has improved but worsens with fatigue. Team has provided ego support during his stay and he has been given resources to follow up with counseling after discharge to help with his anxiety/PTSD symptoms. He made great gains during his stay and is at modified independent to supervision at discharge. He will continue to receive follow up HHPT, HHOT, HHST and HHRN by [facility] after discharge.    Rehab course: During patient's stay in rehab weekly team conferences were held to monitor patient's progress, set goals  and discuss barriers to discharge. At admission, patient required CGA to min assist for ADL tasks and min assist with mobility. Speech therapy evaluation revealed prolonged mastication due to lethargy, fatigue and decreased endurance. Diet was downgraded to D1, thins with aspiration precautions for safety. He  has had improvement in activity tolerance, balance, postural control as well as ability to compensate for deficits.   He is able to complete ADL tasks at modified independent level and requires supervision with showers. He is modified independent for transfers and to ambulate 150' with use of RW. He requires min assist to climb 4 stairs. Family education has been completed. Speech is 90% intelligible and he is tolerating D2 diet with use of compensatory strategies. Intake is impacted by energy levels. He requires supervisory cues for functional problem solving and selective attention with basic and familiar tasks. Family education has been completed.   Disposition: Home  Diet:  Diabetic diet. Chopped foods. Need to take time with meals.   Special Instructions: Note Cardizem was decreased to 45 mg TID prior to d/c  and can be weaned off as HR improves. Recheck CBC and BMET in one week to monitor H/H, potassium and sodium level.   Monitor Blood sugars 2-3 times a day and follow up with PCP for adjustment of medications.    Allergies as of 07/18/2022       Reactions   Azithromycin Other (See Comments)   REACTION: exac of his mg   Beta Adrenergic Blockers Other (See Comments)   REACTION: exac of mg   Calcium Channel Blockers Other (See Comments)   Medications that may worsen or trigger MG exacerbation: Class IA antiarrhythmics, magnesium, flouroquinolones, macrolides, aminoglycosides, penicillamine, curare, interferon alpha, botox, quinine. Use with caution: calcium channel blockers, beta blockers and statins.    Macrolides And Ketolides Other (See Comments)   Medications that may worsen or  trigger MG exacerbation: Class IA antiarrhythmics, magnesium, flouroquinolones, macrolides, aminoglycosides, penicillamine, curare, interferon alpha, botox, quinine. Use with caution: calcium channel blockers, beta blockers and statins.    Statins Other (See Comments)   Medications that may worsen or trigger MG exacerbation: Class IA antiarrhythmics, magnesium, flouroquinolones, macrolides, aminoglycosides, penicillamine, curare, interferon alpha, botox, quinine. Use with caution: calcium channel blockers, beta blockers and statins.         Medication List     STOP taking these medications    calcium carbonate 600 MG Tabs tablet Commonly known as: OS-CAL   colesevelam 625 MG tablet Commonly known as: WELCHOL   feeding supplement Liqd   free water Soln   ibuprofen 400 MG tablet Commonly known as: ADVIL   insulin aspart 100 UNIT/ML FlexPen Commonly known as: NOVOLOG   olmesartan 20 MG tablet Commonly known as: BENICAR   omeprazole 20 MG capsule Commonly known as: PRILOSEC   pimecrolimus 1 % cream Commonly known as: ELIDEL   repaglinide 0.5 MG tablet Commonly known as: PRANDIN   sitaGLIPtin 100 MG tablet Commonly known as: JANUVIA   zolpidem 5 MG tablet Commonly known as: AMBIEN       TAKE these medications    ALPRAZolam 0.5 MG tablet--Rx # 30 pills. Commonly known as: XANAX Take 1 tablet (0.5 mg total) by mouth daily as needed for anxiety. What changed: when to take this   busPIRone 5 MG tablet--Rx # 90 pills  Commonly known as: BUSPAR Take 1 tablet (5 mg total) by mouth 3 (three) times daily.   clonazePAM 0.5 MG tablet--Rx # 60 pills Commonly known as: KLONOPIN Take 1 tablet (0.5 mg total) by mouth 2 (two) times daily. What changed:  medication strength how much to take   desonide 0.05 % gel Commonly known as: DESONATE Apply 1 application topically 2 (two) times daily as needed.   diltiazem 90 MG tablet Commonly known as: CARDIZEM Take 0.5  tablets (45 mg total) by mouth every 8 (eight) hours. What changed:  medication strength how much to take how to take this   entecavir 1 MG tablet Commonly known as: BARACLUDE Take 1 tablet (1 mg total) by mouth daily.   magic mouthwash w/lidocaine Soln Take 5 mLs by mouth 4 (four) times daily, discard after 14 days   metFORMIN 500 MG tablet Commonly known as: GLUCOPHAGE Take 1 tablet (500 mg total) by mouth 2 (two) times daily with a meal.   multivitamin with minerals Tabs tablet Take 1 tablet by mouth daily.   OneTouch Ultra test strip Generic drug: glucose blood USE TO TEST BLOOD SUGAR 3 TIMES DAILY   onetouch ultrasoft lancets USE AS INSTRUCTED TO CHECK BLOOD  SUGARS THREE TIMES A DAY   pantoprazole 40 MG tablet Commonly known as: PROTONIX Take 1 tablet (40 mg total) by mouth 2 (two) times daily. What changed: when to take this Notes to patient: Is stronger than omeprazole--continue this till follow up with Dr. [place].   PARoxetine 20 MG tablet Commonly known as: PAXIL Take 1 tablet (20 mg total) by mouth daily.   predniSONE 10 MG tablet Commonly known as: DELTASONE Take 1.5 tablets (15 mg total) by mouth daily with breakfast. What changed:  medication strength how much to take when to take this   pyridostigmine 60 MG tablet Commonly known as: MESTINON Take 60 mg by mouth 3 (three) times daily.   Vitamin D 50 MCG (2000 UT) tablet Take 2,000 Units by mouth daily.        Follow-up Information     [place], [name], MD Follow up.   Specialty: Family Medicine Why: Call in 1-2 days for post hospital follow up Contact information: [address] [place] [address] [phone]         Genice Rouge, MD Follow up.   Specialty: Physical Medicine and Rehabilitation Why: office will call you with follow up appointment Contact information: [address] [place] [address] [phone]         Evangeline Gula, MD Follow  up.   Specialties: Psychiatry, Neurology Why: Call in 1-2 days for post hospital follow up Contact information: [address] [facility] [address] [place] [address] [phone]                 Signed: Jacquelynn Cree 07/19/2022, 2:31 PM

## 2022-07-18 ENCOUNTER — Inpatient Hospital Stay (HOSPITAL_COMMUNITY): Payer: Medicare Other

## 2022-07-18 ENCOUNTER — Other Ambulatory Visit (HOSPITAL_COMMUNITY): Payer: Self-pay

## 2022-07-18 LAB — BASIC METABOLIC PANEL
Anion gap: 10 (ref 5–15)
BUN: 11 mg/dL (ref 8–23)
CO2: 25 mmol/L (ref 22–32)
Calcium: 8.6 mg/dL — ABNORMAL LOW (ref 8.9–10.3)
Chloride: 98 mmol/L (ref 98–111)
Creatinine, Ser: 1.04 mg/dL (ref 0.61–1.24)
GFR, Estimated: >60 mL/min (ref >60)
Glucose, Bld: 188 mg/dL — ABNORMAL HIGH (ref 70–99)
Potassium: 3.4 mmol/L — ABNORMAL LOW (ref 3.5–5.1)
Sodium: 133 mmol/L — ABNORMAL LOW (ref 135–145)

## 2022-07-18 LAB — CBC WITH DIFFERENTIAL/PLATELET
Abs Immature Granulocytes: 0.02 10*3/uL (ref 0.00–0.07)
Basophils Absolute: 0 10*3/uL (ref 0.0–0.1)
Basophils Relative: 1 %
Eosinophils Absolute: 0.1 10*3/uL (ref 0.0–0.5)
Eosinophils Relative: 2 %
HCT: 27 % — ABNORMAL LOW (ref 39.0–52.0)
Hemoglobin: 8.5 g/dL — ABNORMAL LOW (ref 13.0–17.0)
Immature Granulocytes: 0 %
Lymphocytes Relative: 49 %
Lymphs Abs: 3.2 10*3/uL (ref 0.7–4.0)
MCH: 27.1 pg (ref 26.0–34.0)
MCHC: 31.5 g/dL (ref 30.0–36.0)
MCV: 86 fL (ref 80.0–100.0)
Monocytes Absolute: 0.6 10*3/uL (ref 0.1–1.0)
Monocytes Relative: 9 %
Neutro Abs: 2.5 10*3/uL (ref 1.7–7.7)
Neutrophils Relative %: 39 %
Platelets: 365 10*3/uL (ref 150–400)
RBC: 3.14 MIL/uL — ABNORMAL LOW (ref 4.22–5.81)
RDW: 14.6 % (ref 11.5–15.5)
WBC: 6.5 10*3/uL (ref 4.0–10.5)
nRBC: 0 % (ref 0.0–0.2)

## 2022-07-18 LAB — GLUCOSE, CAPILLARY
Glucose-Capillary: 102 mg/dL — ABNORMAL HIGH (ref 70–99)
Glucose-Capillary: 259 mg/dL — ABNORMAL HIGH (ref 70–99)

## 2022-07-18 MED ORDER — POTASSIUM CHLORIDE CRYS ER 20 MEQ PO TBCR
40.0000 meq | EXTENDED_RELEASE_TABLET | Freq: Once | ORAL | Status: AC
  Administered 2022-07-18: 40 meq via ORAL
  Filled 2022-07-18: qty 2

## 2022-07-18 NOTE — Discharge Instructions (Addendum)
Inpatient Rehab Discharge Instructions  [name] Discharge date and time: 07/18/22   Activities/Precautions/ Functional Status: Activity: no lifting, driving, or strenuous exercise for till cleared by MD Diet: diabetic diet--soft/purred foods.  Wound Care: none needed   Functional status:  ___ No restrictions     ___ Walk up steps independently _X__ 24/7 supervision/assistance   ___ Walk up steps with assistance ___ Intermittent supervision/assistance  ___ Bathe/dress independently ___ Walk with walker     ___ Bathe/dress with assistance ___ Walk Independently    ___ Shower independently ___ Walk with supervision    _X__ Shower with assistance _X__ No alcohol     ___ Return to work/school ________    Special Instructions: Pace yourself. Take a break when you start feeling tired.  Use relaxation techniques to help your breathing/anxiety. Use soothing music. Can use relaxation apps (Insight timer, Calm or another)  [facility] will provide PT, OT, ST and RN Tel #  [phone]   My questions have been answered and I understand these instructions. I will adhere to these goals and the provided educational materials after my discharge from the hospital.  Patient/Caregiver Signature _______________________________ Date __________  Clinician Signature _______________________________________ Date __________  Please bring this form and your medication list with you to all your follow-up doctor's appointments.

## 2022-07-18 NOTE — Progress Notes (Signed)
PROGRESS NOTE   Subjective/Complaints:  Pt reports doing well- ate ~ 30-40% of tray Breathing well and feels good per pt.  Still R eye somewhat droopy per pt.   Got VC 2.2 this Am and NIF -30 this AM   ROS:    Pt denies SOB, abd pain, CP, N/V/C/D, and vision changes   Except for HPI  Objective:   No results found. Recent Labs    07/17/22 1345 07/18/22 0705  WBC 5.1 6.5  HGB 8.4* 8.5*  HCT 26.1* 27.0*  PLT 380 365   Recent Labs    07/15/22 1022 07/18/22 0705  NA 132* 133*  K 3.6 3.4*  CL 98 98  CO2 25 25  GLUCOSE 126* 188*  BUN 14 11  CREATININE 1.06 1.04  CALCIUM 8.6* 8.6*    Intake/Output Summary (Last 24 hours) at 07/18/2022 6578 Last data filed at 07/18/2022 4696 Gross per 24 hour  Intake 640 ml  Output 850 ml  Net -210 ml        Physical Exam: Vital Signs Blood pressure 112/70, pulse 82, temperature 98.6 F (37 C), temperature source Oral, resp. rate 17, height 5\' 5"  (1.651 m), weight 52.3 kg, SpO2 100 %.     General: awake, alert, appropriate, sitting up EOB finished 30-40% of tray; wife at bedside;  NAD HENT: conjugate gaze; oropharynx moist- R eyelid same droopiness this AM/ptosis CV: regular rate and rhythm; no JVD Pulmonary: CTA B/L; no W/R/R- good air movement- sounds good GI: soft, NT, ND, (+)BS Psychiatric: appropriate- interactive Neurological: Ox3 GU: Not examined Skin: Warm and dry, ecchymotic area LLQ and multiple small bruises from heparin injection.    MSK:      No apparent deformity.                RUE: 5-/5 throughout                LUE: 5-/5 throughout-improved                RLE: 4+/5 throughout-improved                LLE:  4+/5 throughout-improved   Neurologic exam:  Cognition: AAO to person, place, time.  No apparent cognitive deficits Insight: Good insight into current condition.  Mood: Pleasant.  Appropriate affect.  Sensation: To light touch intact in  BL UEs and LEs  Reflexes: 1+ in BL UE and LEs. Negative Hoffman's and babinski signs bilaterally.  CN: + R ptosis improved. + mild R facial droop-unchanged Coordination: Bilateral upper extremity tremors with antigravity range of motion  spasticity: MAS 0 in all extremities.     Assessment/Plan: 1. Functional deficits which require 3+ hours per day of interdisciplinary therapy in a comprehensive inpatient rehab setting. Physiatrist is providing close team supervision and 24 hour management of active medical problems listed below. Physiatrist and rehab team continue to assess barriers to discharge/monitor patient progress toward functional and medical goals  Care Tool:  Bathing    Body parts bathed by patient: Right arm, Left arm, Chest, Abdomen, Front perineal area, Buttocks, Right upper leg, Left upper leg, Right lower leg, Left lower leg, Face  Bathing assist Assist Level: Independent with assistive device     Upper Body Dressing/Undressing Upper body dressing   What is the patient wearing?: Pull over shirt    Upper body assist Assist Level: Independent with assistive device    Lower Body Dressing/Undressing Lower body dressing      What is the patient wearing?: Pants, Underwear/pull up     Lower body assist Assist for lower body dressing: Independent with assitive device     Toileting Toileting Toileting Activity did not occur (Clothing management and hygiene only): N/A (no void or bm)  Toileting assist Assist for toileting: Independent with assistive device     Transfers Chair/bed transfer  Transfers assist     Chair/bed transfer assist level: Independent with assistive device     Locomotion Ambulation   Ambulation assist      Assist level: Independent with assistive device Assistive device: Rollator Max distance: 150 ft   Walk 10 feet activity   Assist     Assist level: Independent with assistive device Assistive device: Rollator    Walk 50 feet activity   Assist    Assist level: Independent with assistive device Assistive device: Rollator    Walk 150 feet activity   Assist Walk 150 feet activity did not occur: Safety/medical concerns (2/2 fatigue)  Assist level: Independent with assistive device Assistive device: Rollator    Walk 10 feet on uneven surface  activity   Assist     Assist level: Independent with assistive device Assistive device: Rollator   Wheelchair     Assist Is the patient using a wheelchair?: No   Wheelchair activity did not occur: N/A         Wheelchair 50 feet with 2 turns activity    Assist    Wheelchair 50 feet with 2 turns activity did not occur: N/A       Wheelchair 150 feet activity     Assist  Wheelchair 150 feet activity did not occur: N/A       Blood pressure 112/70, pulse 82, temperature 98.6 F (37 C), temperature source Oral, resp. rate 17, height 5\' 5"  (1.651 m), weight 52.3 kg, SpO2 100 %.  Medical Problem List and Plan: 1. Functional deficits secondary to myasthenia gravis flare             -patient may shower             -ELOS/Goals: 18-21 days, Min A PT/OT/SLP             -Appreciate hospitalist discharge notation: Medications that may worsen or trigger MG exacerbation: Class IA antiarrhythmics, magnesium, flouroquinolones, macrolides, aminoglycosides, penicillamine, curare, interferon alpha, botox, quinine. Use with caution: calcium channel blockers, beta blockers and statins.   -Est discharge 6/17  -Limit session duration, do no work to exhaustion, asked therapy to schedule shorter sessions with more rest breaks  D/c today- went over with pt and wife- would think [facility] would be best f/u-since they probably have more MG patients- but we are happy at [facility] to care for pt if needed.  2.  Antithrombotics: -DVT/anticoagulation:  Pharmaceutical: Lovenox-> SCDs 6-9, see below 6/17- Hb down to 7.3- will recheck in Am  and see if  improves 6/18- Hb back up to 8.5 this AM- fecal occult negative x2- and looks good- will have pt f/u with PCP             -antiplatelet therapy: N/A   3. Pain Management: Tylenol prn.  4. Mood/Behavior/Sleep: Uses Ambien 10 mg as needed at home. LCSW to follow for evaluation and support. Team to provide ego support.              -antipsychotic agents: N/A             --Melatonin prn for insomnia.    5. Neuropsych/cognition: This patient is capable of making decisions on his own behalf. 6. Skin/Wound Care: Routine pressure relief measures 7. Fluids/Electrolytes/Nutrition/dysphagia: Monitor I/O. Change tube feed to 60  cc/hr X 10 hours w/H2O qid.  --start Calorie count tomorrow. Wife encouraged to bring soft foods from home.               --downgrade diet to D2--reports feeling food sticking in throat/throat discomfort from intubations             --MMW added.   -6/9: He is gradually improving, however remain inadequate.  Wait for nutrition This week, should be able to remove core track of p.o.'s improvement next few days.  Added Cepacol lozenge for throat discomfort.  -6/10 appears to be eating more, consider remove cor track in new few days  6/11 50-65 % of meals eaten, pt to work with SLP today, continue cor track now now given pt reports of some concerns about swallow this AM, SLP to eval this AM  6/12 remove cor trak today  6/16 p.o. intake appears fair, 25 to 95%, continue Ensure   8. MG flare w/ respiratory failure: Has completed course of IVIG.  --Continue to monitor NIF/VC--> NIF -40 VC-1.2 L this am.              --Continue home regimen of Mestinon and Prednisone.   -6/12 NIF appears decreased today, will ask respiratory to recheck, if continues to be low will contract neurology   -6/13 NIF went up to -40 yesterday, 25 this AM, continue to monitor, contact neurology  -6/14, Seen by neurology yesterday, prednisone increased to 15mg  daily, appreciate assistance  -6/16 labs  remained stable, continue to monitor   6/17- NIF -30 and VC 1.8- VC has gone down slightly this AM, but if still decreasing tomorrow, will call Neuro.   6/18- NIF back up to -30 and VC up to 2.2 this AM- feeling good and no issues with breathing per pt-  9. Anxiety: Continue Klonopin, Paxil and prn Xanax. Continue to provide ego support.  --Patient frustrated/expressing disappointment that he has not rebounded as in the past. Continue to encourage/provide support.  -- Would benefit from neuropsych evaluation --6-8: Encouraged family regarding patient's low energy, attributable to current MG flare in addition to medications required for mood control.  Will add BuSpar 5 mg twice daily, wean Klonopin to 0.5 mg twice daily if tolerated. -6/9: Anxiety improving, has not required as needed Xanax the last 2 days.  Monitor tonight, consider DC scheduled Klonopin in the a.m. if he remains stable 6/10 one use of xanax last night, overall anxiety improved 6/12 one dose xanax use, overall stable, continue to monitor   6/13 continues to have intermittent anxiety, will need to be careful to distinguish episodes from respiratory worsening  6/15 using Xanax about daily, stable continue to monitor  6/17- using Xanax daily per pt- will con't to monitor closely to make sure anxiety isn't decline in function 10. T2DM: Hgb A1c--5.7 and well controlled. Will monitor BS ac/hs and use SSI for elevated BS             --continue to hold  Metformin and Januvia (discussed need to increase intake w/patient)  -- 6-8: Elevated, eating 25 to 50% of meals.  Continue monitoring, consider addition of Semglee or resuming metformin when appropriate.  -6/11 restart metformin 500mg  BID (home dose 1000mg  BID)  -6/16 controlled overall continue metformin  6/18- continue at hospital dose for now Recent Labs    07/17/22 1625 07/17/22 2104 07/18/22 0631  GLUCAP 165* 89 102*       11. Hep B: On Descovy as substitute-->wife to bring  Entecavir from home.    12. Hypotension/Tachycardia: Ongoing for weeks. Inpatient workup including CTA chest and lower extremity venous duplex negative  - Monitor for any signs of decline in his NIF or vital capacity, if any worsening discontinue Cardizem.   6/18- Vitals table- con't to monitor    07/18/2022    5:20 AM 07/18/2022    5:11 AM 07/17/2022    7:38 PM  Vitals with BMI  Weight 115 lbs 5 oz    BMI 19.19    Systolic  112 118  Diastolic  70 71  Pulse  82 96    13. Anemia - HgB 7.9 -> 7.3 -> 7.0 6/8. Stool card pending. Recheck h/h 6/9, hold lovenox, start SCDs. if below 7 will transfuse.  6/9: H&H 7.4; monitor with a.m. labs.  Only obvious cause at this time is Lovenox so we will continue to hold with SCDs; mobility is improving  6/10 HGB up to 7.8, continue to monitor   -6/16 Hgb up to 8.3, improved  6/17- Hb down to 7.3- not clear why- doesn't have any signs of bleeding- will recheck in AM and also check BMP to see if because less dry?  6/18- Fecal occult negative x2- and Hb up ot 8.5 this AM- 8.4 yesterday afternoon.  14.  Hyponatremia  -6/15 NA stable 132 15. Hypokalemia  6/18- K+ 3.4- will replete 40 mEq x1   I spent a total of 42   minutes on total care today- >50% coordination of care- due to d/w wife after saw pt- also prolonged d/w wife and pt about follow up- see [facility]- but happy to come to [facility] if needs [place]; also d/w them about yesterday afternoon labs- and review of AM labs, NIF and VC- and giving Kcl 40 Meq x1.    LOS: 11 days A FACE TO FACE EVALUATION WAS PERFORMED  Stephnie Parlier 07/18/2022, 9:07 AM

## 2022-07-18 NOTE — Consult Note (Signed)
NAME:  [name], MRN:  [id number], DOB:  April 01, 1955, LOS: 11 ADMISSION DATE:  07/07/2022, CONSULTATION DATE: 07/18/2022 REFERRING MD: Rehab, CHIEF COMPLAINT: Discharge evaluation  History of Present Illness:  67 year old male with a history of myasthenia gravis he has had multiple admissions for shortness of breath.  He has been intubated in the past and currently is in rehab center ready for discharge home.  Due to his anxiety pulmonary was asked to evaluate prior to discharge.  He has no physical reason to prevent him being discharged at this time.  A chest x-ray will be obtained for completeness.  We have recommended that he seek psychological or psychiatric assistance for his traumatic stress disorder as he was in the [place] during the [place].  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 rate 17, height 5\' 5"  (1.651 m), weight 52.3 kg, SpO2 100 %.        Intake/Output Summary (Last 24 hours) at 07/18/2022 1106 Last data filed at 07/18/2022 4098 Gross per 24 hour  Intake 640 ml   Output 850 ml  Net -210 ml   Filed Weights   07/16/22 0455 07/17/22 0542 07/18/22 0520  Weight: 47.9 kg 47.9 kg 52.3 kg    Examination: General: 67 year old male who is in no acute distress HENT: No JVD or lymphadenopathy is appreciated Lungs: Diminished in bases, clear to auscultation with shallow respirations Cardiovascular: Heart sounds are regular Abdomen: Positive bowel sounds Extremities: Thin no edema Neuro: Grossly intact without focal defect but with underlying anxiety   Resolved Hospital Problem list     Assessment & Plan:  Dyspnea in the setting of myasthenia gravis prolonged hospitalization and a large anxiety component.  This elaborative the [place] and when he leaves the safety of the hospital his respiratory status declines.  Currently he is in no acute distress, breathing well.  But he does display signs of anxiety about going home. Posttraumatic stress disorder Myasthenia gravis Major depressive disorder Continue pulmonary toilet Chest x-ray for completeness Would recommend psychiatric/psychology interventions as an outpatient He is already on Xanax, BuSpar, Paxil and your further angiolytics may interfere with his ability to breathe. Will evaluate chest x-ray there is no overt physical reason he cannot be discharged at this time.  Best Practice (right click and "Reselect all SmartList Selections" daily)   Per primary  Labs   CBC: Recent Labs  Lab 07/15/22 1022 07/17/22 0550 07/17/22 1345 07/18/22 0705  WBC 6.6 5.9 5.1 6.5  NEUTROABS  --   --   --  2.5  HGB 8.3* 7.3* 8.4* 8.5*  HCT 25.8* 23.4* 26.1* 27.0*  MCV 88.7 86.0 88.2 86.0  PLT 399 353 380 365    Basic Metabolic Panel: Recent Labs  Lab 07/14/22 1647 07/15/22 1022 07/18/22 0705  NA 131* 132* 133*  K 4.0 3.6 3.4*  CL 99 98 98  CO2 23 25 25   GLUCOSE 132* 126* 188*  BUN 18 14 11   CREATININE 1.03 1.06 1.04  CALCIUM 8.9 8.6* 8.6*   GFR: Estimated Creatinine  Clearance: 52.4 mL/min (by C-G formula based on SCr of 1.04 mg/dL). Recent Labs  Lab 07/15/22 1022 07/17/22 0550 07/17/22 1345 07/18/22 0705  WBC 6.6 5.9 5.1 6.5    Liver Function Tests: No results for input(s): "AST", "ALT", "ALKPHOS", "BILITOT", "PROT", "ALBUMIN" in the last 168 hours. No results for input(s): "LIPASE", "AMYLASE" in the last 168 hours. No results for input(s): "AMMONIA" in the last 168 hours.  ABG    Component Value Date/Time   PHART 7.4 07/07/2022 0414   PCO2ART 39 07/07/2022 0414   PO2ART 94 07/07/2022 0414   HCO3 24.8 07/07/2022 0414   TCO2 32 07/01/2022 0053   ACIDBASEDEF 1.1 06/26/2022 2142   O2SAT 98.6 07/07/2022 0414     Coagulation Profile: No results for input(s): "INR", "PROTIME" in the last 168 hours.  Cardiac Enzymes: No results for input(s): "CKTOTAL", "CKMB", "CKMBINDEX", "TROPONINI" in the last 168 hours.  HbA1C: Hemoglobin A1C  Date/Time Value Ref Range Status  02/03/2022 08:31 AM 5.9 (A) 4.0 - 5.6 % Final  05/02/2021 10:16 AM 5.9 (A) 4.0 - 5.6 % Final   Hgb A1c MFr Bld  Date/Time Value Ref Range Status  06/16/2022 07:22 AM 5.7 (H) 4.8 - 5.6 % Final    Comment:    (NOTE) Pre diabetes:          5.7%-6.4%  Diabetes:              >6.4%  Glycemic control for   <7.0% adults with diabetes   09/17/2017 09:47 PM 5.9 (H) 4.8 - 5.6 % Final    Comment:    (NOTE) Pre diabetes:          5.7%-6.4% Diabetes:              >6.4% Glycemic control for   <7.0% adults with diabetes     CBG: Recent Labs  Lab 07/17/22 0608 07/17/22 1125 07/17/22 1625 07/17/22 2104 07/18/22 0631  GLUCAP 108* 133* 165* 89 102*    Review of Systems:   10 point review of system taken, please see HPI for positives and negatives.   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   Surgical History:   Past Surgical History:  Procedure Laterality Date   THYMECTOMY       Social History:   reports that he has never smoked. He has never used smokeless tobacco. He reports that he does not drink alcohol and does not use drugs.   Family History:  His family history is not on file.   Allergies Allergies  Allergen Reactions  Azithromycin Other (See Comments)    REACTION: exac of his mg   Beta Adrenergic Blockers Other (See Comments)    REACTION: exac of mg   Calcium Channel Blockers Other (See Comments)    Medications that may worsen or trigger MG exacerbation: Class IA antiarrhythmics, magnesium, flouroquinolones, macrolides, aminoglycosides, penicillamine, curare, interferon alpha, botox, quinine. Use with caution: calcium channel blockers, beta blockers and statins.    Macrolides And Ketolides Other (See Comments)    Medications that may worsen or trigger MG exacerbation: Class IA antiarrhythmics, magnesium, flouroquinolones, macrolides, aminoglycosides, penicillamine, curare, interferon alpha, botox, quinine. Use with caution: calcium channel blockers, beta blockers and statins.    Statins Other (See Comments)    Medications that may worsen or trigger MG exacerbation: Class IA antiarrhythmics, magnesium, flouroquinolones, macrolides, aminoglycosides, penicillamine, curare, interferon alpha, botox, quinine. Use with caution: calcium channel blockers, beta blockers and statins.      Home Medications  Prior to Admission medications   Medication Sig Start Date End Date Taking?  Authorizing Provider  ibuprofen (ADVIL) 400 MG tablet Take 400 mg by mouth every 8 (eight) hours as needed for headache or moderate pain. **PTA med NOT restarted inpatient**   Yes [provider]  olmesartan (BENICAR) 20 MG tablet Take 20 mg by mouth daily. **PTA med NOT restarted inpatient**   Yes [provider]  omeprazole (PRILOSEC) 20 MG capsule Take 20 mg by mouth daily. **PTA med NOT restarted inpatient**   Yes [provider]  repaglinide (PRANDIN) 0.5 MG tablet Take 0.5 mg by mouth daily before supper. Taking differently: prn BG > 200 **PTA med NOT restarted inpatient**   Yes [provider]  sitaGLIPtin (JANUVIA) 100 MG tablet Take 100 mg by mouth daily. **PTA med NOT restarted inpatient**   Yes [provider]  zolpidem (AMBIEN) 5 MG tablet Take 5-10 mg by mouth at bedtime as needed for sleep. **PTA med NOT restarted inpatient**   Yes [provider]  ALPRAZolam (XANAX) 0.5 MG tablet Take 1 tablet (0.5 mg total) by mouth daily as needed for anxiety. 07/17/22   Love, Evlyn Kanner, PA-C  busPIRone (BUSPAR) 5 MG tablet Take 1 tablet (5 mg total) by mouth 3 (three) times daily. 07/17/22   Love, Evlyn Kanner, PA-C  calcium carbonate (OS-CAL) 600 MG TABS Take 600 mg by mouth 3 (three) times daily with meals.    [provider]  Cholecalciferol (VITAMIN D) 2000 UNITS tablet Take 2,000 Units by mouth daily.    [provider]  clonazePAM (KLONOPIN) 0.5 MG tablet Take 1 tablet (0.5 mg total) by mouth 2 (two) times daily. 07/17/22   Love, Evlyn Kanner, PA-C  colesevelam (WELCHOL) 625 MG tablet Take 3 tablets (1,875 mg total) by mouth daily. 05/24/22   Shamleffer, Konrad Dolores, MD  desonide (DESONATE) 0.05 % gel Apply 1 application topically 2 (two) times daily as needed.     [provider]  diltiazem (CARDIZEM) 90 MG tablet Take 0.5 tablets (45 mg total) by mouth every 8 (eight) hours. 07/17/22   Love, Evlyn Kanner, PA-C  entecavir  (BARACLUDE) 1 MG tablet Take 1 tablet (1 mg total) by mouth daily. 07/13/11   Brooke Dare, MD  feeding supplement (ENSURE ENLIVE / ENSURE PLUS) LIQD Take 237 mLs by mouth 3 (three) times daily between meals. 07/07/22   Leroy Sea, MD  glucose blood (ONETOUCH ULTRA) test strip USE TO TEST BLOOD SUGAR 3 TIMES DAILY 12/10/20   Romero Belling, MD  insulin aspart (  NOVOLOG) 100 UNIT/ML FlexPen Before each meal 3 times a day, 140-199 - 2 units, 200-250 - 4 units, 251-299 - 6 units,  300-349 - 8 units,  350 or above 10 units. 07/07/22   Leroy Sea, MD  Lancets [facility] ULTRASOFT) lancets USE AS INSTRUCTED TO CHECK BLOOD SUGARS THREE TIMES A DAY 01/13/19   Romero Belling, MD  magic mouthwash w/lidocaine SOLN Take 5 mLs by mouth 4 (four) times daily, discard after 14 days 07/16/22   Love, Evlyn Kanner, PA-C  metFORMIN (GLUCOPHAGE) 500 MG tablet Take 1 tablet (500 mg total) by mouth 2 (two) times daily with a meal. 07/16/22   Love, Evlyn Kanner, PA-C  Multiple Vitamin (MULTIVITAMIN WITH MINERALS) TABS tablet Take 1 tablet by mouth daily. 07/17/22   Love, Evlyn Kanner, PA-C  pantoprazole (PROTONIX) 40 MG tablet Take 1 tablet (40 mg total) by mouth 2 (two) times daily. 07/16/22   Love, Evlyn Kanner, PA-C  PARoxetine (PAXIL) 20 MG tablet Take 1 tablet (20 mg total) by mouth daily. 07/16/22   Love, Evlyn Kanner, PA-C  pimecrolimus (ELIDEL) 1 % cream Apply 1 application topically 2 (two) times daily as needed.  01/01/13   [provider]  predniSONE (DELTASONE) 10 MG tablet Take 1.5 tablets (15 mg total) by mouth daily with breakfast. 07/16/22   Love, Evlyn Kanner, PA-C  pyridostigmine (MESTINON) 60 MG tablet Take 60 mg by mouth 3 (three) times daily.    [provider]  Water For Irrigation, Sterile (FREE WATER) SOLN Place 100 mLs into feeding tube every 6 (six) hours. 07/07/22   Leroy Sea, MD     Critical care time: Elizebeth Brooking Marshell Dilauro ACNP Acute Care Nurse Practitioner [facility] Pulmonary/Critical  Care Please consult Amion 07/18/2022, 11:07 AM

## 2022-07-18 NOTE — Progress Notes (Signed)
Pt performed and NIF and VC with good effort NIF -30 VC 2.2L

## 2022-07-19 ENCOUNTER — Telehealth: Payer: Self-pay

## 2022-07-19 ENCOUNTER — Telehealth: Payer: Self-pay | Admitting: Family Medicine

## 2022-07-19 DIAGNOSIS — J9600 Acute respiratory failure, unspecified whether with hypoxia or hypercapnia: Secondary | ICD-10-CM | POA: Diagnosis not present

## 2022-07-19 DIAGNOSIS — K648 Other hemorrhoids: Secondary | ICD-10-CM | POA: Diagnosis not present

## 2022-07-19 DIAGNOSIS — H538 Other visual disturbances: Secondary | ICD-10-CM | POA: Diagnosis not present

## 2022-07-19 DIAGNOSIS — Z85238 Personal history of other malignant neoplasm of thymus: Secondary | ICD-10-CM | POA: Diagnosis not present

## 2022-07-19 DIAGNOSIS — Z556 Problems related to health literacy: Secondary | ICD-10-CM | POA: Diagnosis not present

## 2022-07-19 DIAGNOSIS — E559 Vitamin D deficiency, unspecified: Secondary | ICD-10-CM | POA: Diagnosis not present

## 2022-07-19 DIAGNOSIS — I10 Essential (primary) hypertension: Secondary | ICD-10-CM | POA: Diagnosis not present

## 2022-07-19 DIAGNOSIS — E119 Type 2 diabetes mellitus without complications: Secondary | ICD-10-CM | POA: Diagnosis not present

## 2022-07-19 DIAGNOSIS — Z7984 Long term (current) use of oral hypoglycemic drugs: Secondary | ICD-10-CM | POA: Diagnosis not present

## 2022-07-19 DIAGNOSIS — F339 Major depressive disorder, recurrent, unspecified: Secondary | ICD-10-CM | POA: Diagnosis not present

## 2022-07-19 DIAGNOSIS — G4700 Insomnia, unspecified: Secondary | ICD-10-CM | POA: Diagnosis not present

## 2022-07-19 DIAGNOSIS — E7849 Other hyperlipidemia: Secondary | ICD-10-CM | POA: Diagnosis not present

## 2022-07-19 DIAGNOSIS — K219 Gastro-esophageal reflux disease without esophagitis: Secondary | ICD-10-CM | POA: Diagnosis not present

## 2022-07-19 DIAGNOSIS — Z7952 Long term (current) use of systemic steroids: Secondary | ICD-10-CM | POA: Diagnosis not present

## 2022-07-19 DIAGNOSIS — F419 Anxiety disorder, unspecified: Secondary | ICD-10-CM | POA: Diagnosis not present

## 2022-07-19 DIAGNOSIS — G7001 Myasthenia gravis with (acute) exacerbation: Secondary | ICD-10-CM | POA: Diagnosis not present

## 2022-07-19 DIAGNOSIS — Z681 Body mass index (BMI) 19 or less, adult: Secondary | ICD-10-CM | POA: Diagnosis not present

## 2022-07-19 DIAGNOSIS — K644 Residual hemorrhoidal skin tags: Secondary | ICD-10-CM | POA: Diagnosis not present

## 2022-07-19 DIAGNOSIS — Z86010 Personal history of colonic polyps: Secondary | ICD-10-CM | POA: Diagnosis not present

## 2022-07-19 DIAGNOSIS — R29810 Facial weakness: Secondary | ICD-10-CM | POA: Diagnosis not present

## 2022-07-19 DIAGNOSIS — E43 Unspecified severe protein-calorie malnutrition: Secondary | ICD-10-CM | POA: Diagnosis not present

## 2022-07-19 DIAGNOSIS — H02401 Unspecified ptosis of right eyelid: Secondary | ICD-10-CM | POA: Diagnosis not present

## 2022-07-19 DIAGNOSIS — B1910 Unspecified viral hepatitis B without hepatic coma: Secondary | ICD-10-CM | POA: Diagnosis not present

## 2022-07-19 NOTE — Progress Notes (Unsigned)
HPI: [name] is a 67 y.o. male, who is here today with his wife to follow on recent hospitalization. Admitted to ICU on 06/21/22 to 07/07/22. Myasthenia crisis that required intubation. Discharged on 07/18/22. TOC call on 07/19/22. He was discharged home on 06/19/2022, readmitted on 06/21/2022 due to worsening symptoms, including difficulty with breathing.  BP on initial evaluation BP was 209/163 and HR 164/min, and found to be tachypneic at 34/minute and low NIF.  CTA chest negative for PE, incidental dilatation of aorta. 2 D echo showed EF 60-65% with mild concentric LVH.  He was extubated but failed trials of BIPAP and required reintubation x 2.  He was treated with 5 day course of IVIG.  He also had episodes of SVT, hypotension as well as RUE weakness with negative brain MRI.   He still has mild throat discomfort and dysphagia as well as right palpebral ptosis but feels a lot of better. Symptoms are worse in the afternoon. + Fatigue. Denies dyspnea, CP,or palpitations. He is on Prednisone 10 mg 1.5 tab daily and Pyridostigmine 60 mg tid. He has not arranged follow up appt with his neurologist, Dr Georgina Pillion.  PT once per week. Appetite has improved.  HTN: He is on Diltiazem 90 mg 1/2 tab tid. BP today at home 112/63. No longer on Benicar.  Lab Results  Component Value Date   CREATININE 1.04 07/18/2022   BUN 11 07/18/2022   NA 133 (L) 07/18/2022   K 3.4 (L) 07/18/2022   CL 98 07/18/2022   CO2 25 07/18/2022   Lab Results  Component Value Date   WBC 6.5 07/18/2022   HGB 8.5 (L) 07/18/2022   HCT 27.0 (L) 07/18/2022   MCV 86.0 07/18/2022   PLT 365 07/18/2022   Elevated transaminases. Negative for abdominal pain,nausea,vomiting,or jaundice. Hep B carrier: He is on Entecavir 1 mg daily and follows with ID.  Lab Results  Component Value Date   ALT 64 (H) 07/08/2022   AST 52 (H) 07/08/2022   ALKPHOS 48 07/08/2022   BILITOT 0.4 07/08/2022   DM II: He was discharged on  Metformin 500 gm bid and recently instructed to go back to 1000 mg bid. BS's low 100's. Lab Results  Component Value Date   HGBA1C 5.7 (H) 06/16/2022   Anemia: Has not noted blood in stool or melena. Iron studies in normal range.  Lab Results  Component Value Date   WBC 6.5 07/18/2022   HGB 8.5 (L) 07/18/2022   HCT 27.0 (L) 07/18/2022   MCV 86.0 07/18/2022   PLT 365 07/18/2022   Lab Results  Component Value Date   VITAMINB12 488 07/06/2022   Anxiety: Fluoxetine was discontented. He is on Paroxetine 20 mg daily and Buspar 5 mg tid. Clonazepam 0.5 mg bid and Xanax 0.5 mg at night prn for sleep. Ambien was discontinued. Reports that sleep has improved. He has not established with psychiatrist and reluctant to do so.  Review of Systems  Constitutional:  Negative for chills and fever.  HENT:  Negative for mouth sores and sore throat.   Respiratory:  Negative for cough and wheezing.   Cardiovascular:  Negative for chest pain, palpitations and leg swelling.  Genitourinary:  Negative for decreased urine volume, dysuria and hematuria.  Musculoskeletal:  Negative for gait problem and myalgias.  Skin:  Negative for rash.  Neurological:  Negative for syncope, weakness and headaches.  Psychiatric/Behavioral:  Negative for confusion and hallucinations.   See other pertinent positives and negatives in HPI.  Current Outpatient Medications on File Prior to Visit  Medication Sig Dispense Refill   ALPRAZolam (XANAX) 0.5 MG tablet Take 1 tablet (0.5 mg total) by mouth daily as needed for anxiety. 30 tablet 0   busPIRone (BUSPAR) 5 MG tablet Take 1 tablet (5 mg total) by mouth 3 (three) times daily. 90 tablet 0   Cholecalciferol (VITAMIN D) 2000 UNITS tablet Take 2,000 Units by mouth daily.     clonazePAM (KLONOPIN) 0.5 MG tablet Take 1 tablet (0.5 mg total) by mouth 2 (two) times daily. 60 tablet 0   desonide (DESONATE) 0.05 % gel Apply 1 application topically 2 (two) times daily as needed.       diltiazem (CARDIZEM) 90 MG tablet Take 0.5 tablets (45 mg total) by mouth every 8 (eight) hours. 135 tablet 0   entecavir (BARACLUDE) 1 MG tablet Take 1 tablet (1 mg total) by mouth daily. 30 tablet 6   glucose blood (ONETOUCH ULTRA) test strip USE TO TEST BLOOD SUGAR 3 TIMES DAILY 100 strip 11   Lancets (ONETOUCH ULTRASOFT) lancets USE AS INSTRUCTED TO CHECK BLOOD SUGARS THREE TIMES A DAY 200 each 3   magic mouthwash w/lidocaine SOLN Take 5 mLs by mouth 4 (four) times daily, discard after 14 days 320 mL 0   metFORMIN (GLUCOPHAGE) 500 MG tablet Take 1 tablet (500 mg total) by mouth 2 (two) times daily with a meal. 60 tablet 0   Multiple Vitamin (MULTIVITAMIN WITH MINERALS) TABS tablet Take 1 tablet by mouth daily. 100 tablet 0   pantoprazole (PROTONIX) 40 MG tablet Take 1 tablet (40 mg total) by mouth 2 (two) times daily. 60 tablet 0   PARoxetine (PAXIL) 20 MG tablet Take 1 tablet (20 mg total) by mouth daily. 30 tablet 0   predniSONE (DELTASONE) 10 MG tablet Take 1.5 tablets (15 mg total) by mouth daily with breakfast. 90 tablet 0   pyridostigmine (MESTINON) 60 MG tablet Take 60 mg by mouth 3 (three) times daily.     No current facility-administered medications on file prior to visit.    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)    Spongiotic dermatitis    Type II or unspecified type diabetes mellitus without mention of complication, not stated as uncontrolled    Viral hepatitis B without mention of hepatic coma, chronic, without mention of hepatitis delta    Allergies  Allergen Reactions   Azithromycin Other (See Comments)    REACTION: exac of his  mg   Beta Adrenergic Blockers Other (See Comments)    REACTION: exac of mg   Calcium Channel Blockers Other (See Comments)    Medications that may worsen or trigger MG exacerbation: Class IA antiarrhythmics, magnesium, flouroquinolones, macrolides, aminoglycosides, penicillamine, curare, interferon alpha, botox, quinine. Use with caution: calcium channel blockers, beta blockers and statins.    Macrolides And Ketolides Other (See Comments)    Medications that may worsen or trigger MG exacerbation: Class IA antiarrhythmics, magnesium, flouroquinolones, macrolides, aminoglycosides, penicillamine, curare, interferon alpha, botox, quinine. Use with caution: calcium channel blockers, beta blockers and statins.    Statins Other (See Comments)    Medications that  may worsen or trigger MG exacerbation: Class IA antiarrhythmics, magnesium, flouroquinolones, macrolides, aminoglycosides, penicillamine, curare, interferon alpha, botox, quinine. Use with caution: calcium channel blockers, beta blockers and statins.     Social History   Socioeconomic History   Marital status: Married    Spouse name: Not on file   Number of children: Not on file   Years of education: Not on file   Highest education level: Not on file  Occupational History   Not on file  Tobacco Use   Smoking status: Never   Smokeless tobacco: Never  Substance and Sexual Activity   Alcohol use: No   Drug use: No   Sexual activity: Not Currently  Other Topics Concern   Not on file  Social History Narrative   Not on file   Social Determinants of Health   Financial Resource Strain: Not on file  Food Insecurity: No Food Insecurity (07/19/2022)   Hunger Vital Sign    Worried About Running Out of Food in the Last Year: Never true    Ran Out of Food in the Last Year: Never true  Recent Concern: Food Insecurity - Food Insecurity Present (06/16/2022)   Hunger Vital Sign    Worried About Running Out of Food in the Last Year: Sometimes  true    Ran Out of Food in the Last Year: Never true  Transportation Needs: No Transportation Needs (07/19/2022)   PRAPARE - Administrator, Civil Service (Medical): No    Lack of Transportation (Non-Medical): No  Physical Activity: Not on file  Stress: Not on file  Social Connections: Not on file   Vitals:   07/21/22 1133  BP: 120/70  Pulse: 100  Resp: 16  Temp: 99 F (37.2 C)  SpO2: 99%   Body mass index is 18.3 kg/m.  Physical Exam Vitals and nursing note reviewed.  Constitutional:      General: He is not in acute distress.    Appearance: He is well-developed.  HENT:     Head: Normocephalic and atraumatic.     Mouth/Throat:     Mouth: Mucous membranes are moist.  Eyes:     Conjunctiva/sclera: Conjunctivae normal.     Comments: Mild right palpebral ptosis.  Cardiovascular:     Rate and Rhythm: Normal rate and regular rhythm.     Pulses:          Dorsalis pedis pulses are 2+ on the right side and 2+ on the left side.     Heart sounds: No murmur heard. Pulmonary:     Effort: Pulmonary effort is normal. No respiratory distress.     Breath sounds: Normal breath sounds.  Abdominal:     Palpations: Abdomen is soft. There is no hepatomegaly or mass.     Tenderness: There is no abdominal tenderness.  Skin:    General: Skin is warm.     Findings: No erythema or rash.  Neurological:     General: No focal deficit present.     Mental Status: He is alert and oriented to person, place, and time.     Cranial Nerves: No cranial nerve deficit.     Comments: Stable gait,not assisted.  Psychiatric:        Mood and Affect: Mood is anxious.   ASSESSMENT AND PLAN:  Mr. Solomon was seen today for hospitalization follow-up.  Diagnoses and all orders for this visit: Lab Results  Component Value Date   WBC 7.4 07/21/2022   HGB 8.7 Repeated and  verified X2. (L) 07/21/2022   HCT 27.0 (L) 07/21/2022   MCV 84.7 07/21/2022   PLT 364.0 07/21/2022   Lab Results   Component Value Date   ALT 12 07/21/2022   AST 15 07/21/2022   ALKPHOS 43 07/21/2022   BILITOT 0.3 07/21/2022   Lab Results  Component Value Date   CREATININE 1.21 07/21/2022   BUN 11 07/21/2022   NA 136 07/21/2022   K 4.3 07/21/2022   CL 103 07/21/2022   CO2 25 07/21/2022   Insomnia, unspecified type Assessment & Plan: He is not longer on Ambien. Continue Xanax 0.5 mg daily at bedtime as needed and good sleep hygiene.  Type 2 diabetes mellitus with other specified complication, without long-term current use of insulin (HCC) Assessment & Plan: HGA1C at goal. Currently on Metformin 500 mg 2 tab bid. Has appt with endocrinologist on 08/09/22.  Hypertension, essential, benign Assessment & Plan: BP adequately controlled. Continue Diltiazem 90 mg 1/2 tab tid and low salt diet. Continue monitoring BP regularly.  Orders: -     Basic metabolic panel; Future  Myasthenia exacerbation (HCC) Assessment & Plan: Recent acute exacerbation that required intubation and 5 days of IVIG. Reporting improvement of symptoms. Speech therapy evaluation and swallowing study  on 07/04/22, recommendations were given. Aspiration precautions discussed. On Prednisone 15 mg daily and Pyridostigmine 60 mg tid. He is to call Dr Rosanne Ashing office to arrange f/u appt.   Elevated transaminase level Hx of hepatitis B carrier. Will re-check today. Further recommendations according to lab result.  -     Hepatic function panel; Future  Anemia, unspecified type Normocytic, iron and B12 studies normal. Colonoscopy 2008 and FOBT negative in 07/2022. Further recommendations according to lab results.  -     CBC; Future  Generalized anxiety disorder Assessment & Plan: Improved. Continue Paroxetine 20 mg daily, Buspar 5 mg tid,and Clonazepam 0.5 mg bid. Xanax 0.5 mg at bedtime as needed. Instructed to call and arrange appt with psychiatrist.  Return in about 3 months (around 10/21/2022) for chronic  problems.  Alka Falwell G. [place], MD  [facility]. [place] office.

## 2022-07-19 NOTE — Transitions of Care (Post Inpatient/ED Visit) (Signed)
07/19/2022  Name: [name] MRN: [id number] DOB: Apr 11, 1955  Today's TOC FU Call Status: Today's TOC FU Call Status:: Successful TOC FU Call Competed TOC FU Call Complete Date: 07/19/22  Transition Care Management Follow-up Telephone Call Date of Discharge: 07/18/22 Discharge Facility: [facility]) Type of Discharge: Inpatient Admission Primary Inpatient Discharge Diagnosis:: "myasthenia crisis" How have you been since you were released from the hospital?: Better (Pt states he rested well last night. Appetite has improved since returning home. He denies any pain at present.) Any questions or concerns?: Yes Patient Questions/Concerns:: Pt wanted to know if he can resume prior to admission dosage of Metformin(1000mg  BID) isntead of current dose 500mg  BID and restart Prandin that was discontinued per d/c instructions-states he is concerned about his blood sugar readings since coming home as they have been "higher than normal"- fasting AM cbg-114, CBG 337 at lunch-rechecked 2hrs later 276 Patient Questions/Concerns Addressed: Notified Provider of Patient Questions/Concerns (RN CM contacted pt's DM MD-Dr. Lonzo Cloud office at [phone] and left message for nurse/medical assistant regarding pt's concerns and requested office follow up with pt)  Items Reviewed: Did you receive and understand the discharge instructions provided?: Yes Medications obtained,verified, and reconciled?: Yes (Medications Reviewed) Any new allergies since your discharge?: No Dietary orders reviewed?: Yes Type of Diet Ordered:: low salt/heart healthy/carb modified Do you have support at home?: Yes People in Home: child(ren), adult, spouse Name of Support/Comfort Primary Source: lives with spouse and son  Medications Reviewed Today: Medications Reviewed Today     Reviewed by Charlyn Minerva, RN (Registered Nurse) on 07/19/22 at 1354  Med List Status: <None>   Medication Order Taking? Sig  Documenting Provider Last Dose Status Informant  ALPRAZolam (XANAX) 0.5 MG tablet [id number] Yes Take 1 tablet (0.5 mg total) by mouth daily as needed for anxiety. Jacquelynn Cree, PA-C Taking Active   busPIRone (BUSPAR) 5 MG tablet [id number] Yes Take 1 tablet (5 mg total) by mouth 3 (three) times daily. Jacquelynn Cree, PA-C Taking Active   Cholecalciferol (VITAMIN D) 2000 UNITS tablet [id number] Yes Take 2,000 Units by mouth daily. [provider] Taking Active Self  clonazePAM (KLONOPIN) 0.5 MG tablet [id number] Yes Take 1 tablet (0.5 mg total) by mouth 2 (two) times daily. Jacquelynn Cree, PA-C Taking Active   desonide (DESONATE) 0.05 % gel [id number] Yes Apply 1 application topically 2 (two) times daily as needed.  [provider] Taking Active Self  diltiazem (CARDIZEM) 90 MG tablet [id number] Yes Take 0.5 tablets (45 mg total) by mouth every 8 (eight) hours. Jacquelynn Cree, PA-C Taking Active   entecavir (BARACLUDE) 1 MG tablet [id number] Yes Take 1 tablet (1 mg total) by mouth daily. Brooke Dare, MD Taking Active Self  glucose blood [facility] ULTRA) test strip [id number] Yes USE TO TEST BLOOD SUGAR 3 TIMES DAILY Romero Belling, MD Taking Active   Lancets [facility] ULTRASOFT) lancets [id number] Yes USE AS INSTRUCTED TO CHECK BLOOD SUGARS THREE TIMES A Candida Peeling, MD Taking Active   magic mouthwash w/lidocaine Criss Rosales [id number] Yes Take 5 mLs by mouth 4 (four) times daily, discard after 14 days Love, Pamela S, PA-C Taking Active   metFORMIN (GLUCOPHAGE) 500 MG tablet [id number] Yes Take 1 tablet (500 mg total) by mouth 2 (two) times daily with a meal. Love, Pamela S, PA-C Taking Active   Multiple Vitamin (MULTIVITAMIN WITH MINERALS) TABS tablet [id number] Yes Take 1 tablet by mouth daily. Jacquelynn Cree, PA-C Taking Active   pantoprazole (PROTONIX)  40 MG tablet [id number] Yes Take 1 tablet (40 mg total) by mouth 2 (two) times daily. Jacquelynn Cree, PA-C Taking Active   PARoxetine (PAXIL)  20 MG tablet [id number] Yes Take 1 tablet (20 mg total) by mouth daily. Jacquelynn Cree, PA-C Taking Active   predniSONE (DELTASONE) 10 MG tablet [id number] Yes Take 1.5 tablets (15 mg total) by mouth daily with breakfast. Love, Evlyn Kanner, PA-C Taking Active   pyridostigmine (MESTINON) 60 MG tablet [id number] Yes Take 60 mg by mouth 3 (three) times daily. [provider] Taking Active             Home Care and Equipment/Supplies: Were Home Health Services Ordered?: Yes Name of Home Health Agency:: [facility] Has Agency set up a time to come to your home?: Yes First Home Health Visit Date: 07/19/22 (Pt voices someone was out to see him earlier today.) Any new equipment or medical supplies ordered?: Yes Name of Medical supply agency?: [employer] Were you able to get the equipment/medical supplies?: No (Pt's family is to go to DME store to pick up DME and will do so tomorrow per pt) Do you have any questions related to the use of the equipment/supplies?: No  Functional Questionnaire: Do you need assistance with bathing/showering or dressing?: No Do you need assistance with meal preparation?: No Do you need assistance with eating?: No Do you have difficulty maintaining continence: No Do you need assistance with getting out of bed/getting out of a chair/moving?: No Do you have difficulty managing or taking your medications?: No  Follow up appointments reviewed: PCP Follow-up appointment confirmed?: Yes Date of PCP follow-up appointment?: 07/21/22 Follow-up Provider: Dr. [place] Specialist [facility] Follow-up appointment confirmed?: Yes Date of Specialist follow-up appointment?: 08/09/22 Follow-Up Specialty Provider:: Dr. Lonzo Cloud Do you need transportation to your follow-up appointment?: No (pt confirms his wife or son will take him to appts) Do you understand care options if your condition(s) worsen?: Yes-patient verbalized understanding  SDOH Interventions Today     Flowsheet Row Most Recent Value  SDOH Interventions   Food Insecurity Interventions Intervention Not Indicated  Transportation Interventions Intervention Not Indicated      TOC Interventions Today    Flowsheet Row Most Recent Value  TOC Interventions   TOC Interventions Discussed/Reviewed TOC Interventions Discussed, Contacted provider for patient needs      Interventions Today    Flowsheet Row Most Recent Value  Chronic Disease   Chronic disease during today's visit Diabetes  General Interventions   General Interventions Discussed/Reviewed General Interventions Discussed, Durable Medical Equipment (DME), Referral to Nurse  [follow up appt scheduled with assigned RN care coordinator]  Durable Medical Equipment (DME) Glucomoter  Education Interventions   Education Provided Provided Education  Provided Verbal Education On Nutrition, Blood Sugar Monitoring, Medication, When to see the doctor  Mental Health Interventions   Mental Health Discussed/Reviewed Mental Health Discussed, Anxiety  Nutrition Interventions   Nutrition Discussed/Reviewed Nutrition Discussed, Adding fruits and vegetables, Increasing proteins, Decreasing salt, Decreasing sugar intake, Carbohydrate meal planning  Pharmacy Interventions   Pharmacy Dicussed/Reviewed Pharmacy Topics Discussed, Medications and their functions  Safety Interventions   Safety Discussed/Reviewed Safety Discussed, Home Safety       [facility]lessandra Grout [facility] Care Management Care Management Community Coordinator Direct Phone: [phone] Toll Free: [phone] Fax: [phone]

## 2022-07-19 NOTE — Telephone Encounter (Signed)
Patient states that his Metformin reduced to 500mg  BID  and his Prandin was stopped while he was in the hospital. Sugars have been running in the 200 to 300s and he would like to know if he can start back talking his regular doses to get sugars under control. They had cut medications in hospital because he was not eating. Please advises

## 2022-07-19 NOTE — Telephone Encounter (Signed)
Kathy PT [facility] is calling and would VO for PT 1x8 and also sn evaluation

## 2022-07-20 NOTE — Telephone Encounter (Signed)
[name], OK to resume previous meds. C

## 2022-07-20 NOTE — Telephone Encounter (Signed)
Pt has been notified and voices understanding.  

## 2022-07-20 NOTE — Telephone Encounter (Signed)
Left a detailed message,   J.Zeynep Fantroy,RMA  

## 2022-07-21 ENCOUNTER — Encounter: Payer: Self-pay | Admitting: Family Medicine

## 2022-07-21 ENCOUNTER — Ambulatory Visit (INDEPENDENT_AMBULATORY_CARE_PROVIDER_SITE_OTHER): Payer: Medicare Other | Admitting: Family Medicine

## 2022-07-21 VITALS — BP 120/70 | HR 100 | Temp 99.0°F | Resp 16 | Ht 65.0 in | Wt 110.0 lb

## 2022-07-21 DIAGNOSIS — F411 Generalized anxiety disorder: Secondary | ICD-10-CM | POA: Diagnosis not present

## 2022-07-21 DIAGNOSIS — G4700 Insomnia, unspecified: Secondary | ICD-10-CM | POA: Diagnosis not present

## 2022-07-21 DIAGNOSIS — R7401 Elevation of levels of liver transaminase levels: Secondary | ICD-10-CM | POA: Diagnosis not present

## 2022-07-21 DIAGNOSIS — D649 Anemia, unspecified: Secondary | ICD-10-CM

## 2022-07-21 DIAGNOSIS — I10 Essential (primary) hypertension: Secondary | ICD-10-CM | POA: Diagnosis not present

## 2022-07-21 DIAGNOSIS — Z7984 Long term (current) use of oral hypoglycemic drugs: Secondary | ICD-10-CM

## 2022-07-21 DIAGNOSIS — E1169 Type 2 diabetes mellitus with other specified complication: Secondary | ICD-10-CM

## 2022-07-21 DIAGNOSIS — G7001 Myasthenia gravis with (acute) exacerbation: Secondary | ICD-10-CM | POA: Diagnosis not present

## 2022-07-21 LAB — BASIC METABOLIC PANEL
BUN: 11 mg/dL (ref 6–23)
CO2: 25 mEq/L (ref 19–32)
Calcium: 8.8 mg/dL (ref 8.4–10.5)
Chloride: 103 mEq/L (ref 96–112)
Creatinine, Ser: 1.21 mg/dL (ref 0.40–1.50)
GFR: 62.62 mL/min (ref >60.00)
Glucose, Bld: 239 mg/dL — ABNORMAL HIGH (ref 70–99)
Potassium: 4.3 mEq/L (ref 3.5–5.1)
Sodium: 136 mEq/L (ref 135–145)

## 2022-07-21 LAB — CBC
HCT: 27 % — ABNORMAL LOW (ref 39.0–52.0)
Hemoglobin: 8.7 g/dL — ABNORMAL LOW (ref 13.0–17.0)
MCHC: 32.4 g/dL (ref 30.0–36.0)
MCV: 84.7 fl (ref 78.0–100.0)
Platelets: 364 10*3/uL (ref 150.0–400.0)
RBC: 3.18 Mil/uL — ABNORMAL LOW (ref 4.22–5.81)
RDW: 15.1 % (ref 11.5–15.5)
WBC: 7.4 10*3/uL (ref 4.0–10.5)

## 2022-07-21 LAB — HEPATIC FUNCTION PANEL
ALT: 12 U/L (ref 0–53)
AST: 15 U/L (ref 0–37)
Albumin: 3.7 g/dL (ref 3.5–5.2)
Alkaline Phosphatase: 43 U/L (ref 39–117)
Bilirubin, Direct: 0.1 mg/dL (ref 0.0–0.3)
Total Bilirubin: 0.3 mg/dL (ref 0.2–1.2)
Total Protein: 7 g/dL (ref 6.0–8.3)

## 2022-07-21 NOTE — Telephone Encounter (Signed)
Verbal given to [place]. [name] wanted to make PCP aware of two drug interactions; prednisone and mestinon, and alprazolam and diltiazem.

## 2022-07-21 NOTE — Patient Instructions (Addendum)
A few things to remember from today's visit:  Myasthenia gravis with acute exacerbation (HCC)  Type 2 diabetes mellitus with other specified complication, without long-term current use of insulin (HCC)  Hypertension, essential, benign - Plan: Basic metabolic panel  Myasthenia exacerbation (HCC)  Elevated transaminase level - Plan: Hepatic function panel  Anemia, unspecified type - Plan: CBC  No changes today. Please call psychiatrist's office. Call your neurologist to arrange follow up appt. Monitor blood pressure.  If you need refills for medications you take chronically, please call your pharmacy. Do not use My Chart to request refills or for acute issues that need immediate attention. If you send a my chart message, it may take a few days to be addressed, specially if I am not in the office.  Please be sure medication list is accurate. If a new problem present, please set up appointment sooner than planned today.

## 2022-07-22 NOTE — Assessment & Plan Note (Addendum)
Improved. Continue Paroxetine 20 mg daily, Buspar 5 mg tid,and Clonazepam 0.5 mg bid. Xanax 0.5 mg at bedtime as needed. Instructed to call and arrange appt with psychiatrist.

## 2022-07-22 NOTE — Assessment & Plan Note (Deleted)
Reporting improvement of symptoms. On Prednisone 15 mg daily and Pyridostigmine 60 mg tid. He is to call Dr Rosanne Ashing office to arrange f/u appt.

## 2022-07-22 NOTE — Assessment & Plan Note (Signed)
Recent acute exacerbation that required intubation and 5 days of IVIG. Reporting improvement of symptoms. On Prednisone 15 mg daily and Pyridostigmine 60 mg tid. He is to call Dr Rosanne Ashing office to arrange f/u appt.

## 2022-07-22 NOTE — Assessment & Plan Note (Signed)
BP adequately controlled. Continue Diltiazem 90 mg 1/2 tab tid and low salt diet. Continue monitoring BP regularly.

## 2022-07-22 NOTE — Assessment & Plan Note (Signed)
He is not longer on Ambien. Continue Xanax 0.5 mg daily at bedtime as needed and good sleep hygiene.

## 2022-07-22 NOTE — Assessment & Plan Note (Signed)
HGA1C at goal. Currently on Metformin 500 mg 2 tab bid. Has appt with endocrinologist on 08/09/22.

## 2022-07-24 ENCOUNTER — Ambulatory Visit: Payer: Self-pay

## 2022-07-24 NOTE — Patient Instructions (Signed)
Visit Information  Thank you for taking time to visit with me today. Please don't hesitate to contact me if I can be of assistance to you.   Following are the goals we discussed today:   Goals Addressed               This Visit's Progress     Get better to live normal (pt-stated)        Patient Goals/Self Care Activities: -Patient/Caregiver will self-administer medications as prescribed as evidenced by self-report/primary caregiver report  -Patient/Caregiver will attend all scheduled provider appointments as evidenced by clinician review of documented attendance to scheduled appointments and patient/caregiver report -Patient/Caregiver will call provider office for new concerns or questions as evidenced by review of documented incoming telephone call notes and patient report  -check blood sugar at prescribed times -check blood sugar if I feel it is too high or too low -record values and write them down take them to all doctor visits    Patient states doing better.  He still has some issues swallowing due to Mysthenia Gravis but he eats soft foods.  Discussed swallow precautions. He verbalized understanding.  Patient has reached out to neurologists at [facility] and waiting for an appointment.    Blood sugars better as he is back on his normal metformin dose.  Last blood sugar 118.  Discussed diabetes management.           Our next appointment is by telephone on 08/07/22 at 1:00 pm  Please call the care guide team at [phone] if you need to cancel or reschedule your appointment.   If you are experiencing a Mental Health or Behavioral Health Crisis or need someone to talk to, please call the Suicide and Crisis Lifeline: 988   The patient verbalized understanding of instructions, educational materials, and care plan provided today and DECLINED offer to receive copy of patient instructions, educational materials, and care plan.   The patient has been provided with contact information  for the care management team and has been advised to call with any health related questions or concerns.   [name], RN, MSN [facility] Care Management Care Management Coordinator Direct Line [phone]

## 2022-07-24 NOTE — Telephone Encounter (Signed)
It is ok to give verbal authorization to requested services. Prednisone and mestinon are being prescribed by his neurologist and needed at this time. Thanks, BJ

## 2022-07-24 NOTE — Patient Outreach (Signed)
  Care Coordination   Initial Visit Note   07/24/2022 Name: [name] MRN: [id number] DOB: Jan 19, 1956  [name] is a 67 y.o. year old male who sees [place], Timoteo Expose, MD for primary care. I spoke with  [name] by phone today.  What matters to the patients health and wellness today?  Getting better to live noramlly    Goals Addressed             This Visit's Progress    Get better-to be normal       Patient Goals/Self Care Activities: -Patient/Caregiver will self-administer medications as prescribed as evidenced by self-report/primary caregiver report  -Patient/Caregiver will attend all scheduled provider appointments as evidenced by clinician review of documented attendance to scheduled appointments and patient/caregiver report -Patient/Caregiver will call provider office for new concerns or questions as evidenced by review of documented incoming telephone call notes and patient report  -check blood sugar at prescribed times -check blood sugar if I feel it is too high or too low -record values and write them down take them to all doctor visits    Patient states doing better.  He still has some issues swallowing due to Mysthenia Gravis but he eats soft foods.  Discussed swallow precautions. He verbalized understanding.  Patient has reached out to neurologists at [facility] and waiting for an appointment.    Blood sugars better as he is back on his normal metformin dose.  Last blood sugar 118.  Discussed diabetes management.           SDOH assessments and interventions completed:  Yes  SDOH Interventions Today    Flowsheet Row Most Recent Value  SDOH Interventions   Food Insecurity Interventions Intervention Not Indicated  Utilities Interventions Intervention Not Indicated        Care Coordination Interventions:  Yes, provided   Follow up plan: Follow up call scheduled for July    Encounter Outcome:  Pt. Visit Completed   [name], RN, MSN [facility] Care  Management Care Management Coordinator Direct Line [phone]

## 2022-07-25 DIAGNOSIS — G7001 Myasthenia gravis with (acute) exacerbation: Secondary | ICD-10-CM | POA: Diagnosis not present

## 2022-07-25 DIAGNOSIS — J9600 Acute respiratory failure, unspecified whether with hypoxia or hypercapnia: Secondary | ICD-10-CM | POA: Diagnosis not present

## 2022-07-25 DIAGNOSIS — E119 Type 2 diabetes mellitus without complications: Secondary | ICD-10-CM | POA: Diagnosis not present

## 2022-07-25 DIAGNOSIS — B1910 Unspecified viral hepatitis B without hepatic coma: Secondary | ICD-10-CM | POA: Diagnosis not present

## 2022-07-25 DIAGNOSIS — I10 Essential (primary) hypertension: Secondary | ICD-10-CM | POA: Diagnosis not present

## 2022-07-25 DIAGNOSIS — H538 Other visual disturbances: Secondary | ICD-10-CM | POA: Diagnosis not present

## 2022-07-26 DIAGNOSIS — I10 Essential (primary) hypertension: Secondary | ICD-10-CM | POA: Diagnosis not present

## 2022-07-26 DIAGNOSIS — B1910 Unspecified viral hepatitis B without hepatic coma: Secondary | ICD-10-CM | POA: Diagnosis not present

## 2022-07-26 DIAGNOSIS — E119 Type 2 diabetes mellitus without complications: Secondary | ICD-10-CM | POA: Diagnosis not present

## 2022-07-26 DIAGNOSIS — G7001 Myasthenia gravis with (acute) exacerbation: Secondary | ICD-10-CM | POA: Diagnosis not present

## 2022-07-26 DIAGNOSIS — H538 Other visual disturbances: Secondary | ICD-10-CM | POA: Diagnosis not present

## 2022-07-26 DIAGNOSIS — J9600 Acute respiratory failure, unspecified whether with hypoxia or hypercapnia: Secondary | ICD-10-CM | POA: Diagnosis not present

## 2022-07-27 DIAGNOSIS — G7000 Myasthenia gravis without (acute) exacerbation: Secondary | ICD-10-CM | POA: Diagnosis not present

## 2022-07-27 DIAGNOSIS — Z79899 Other long term (current) drug therapy: Secondary | ICD-10-CM | POA: Diagnosis not present

## 2022-07-28 DIAGNOSIS — H538 Other visual disturbances: Secondary | ICD-10-CM | POA: Diagnosis not present

## 2022-07-28 DIAGNOSIS — J9600 Acute respiratory failure, unspecified whether with hypoxia or hypercapnia: Secondary | ICD-10-CM | POA: Diagnosis not present

## 2022-07-28 DIAGNOSIS — G7001 Myasthenia gravis with (acute) exacerbation: Secondary | ICD-10-CM | POA: Diagnosis not present

## 2022-07-28 DIAGNOSIS — I10 Essential (primary) hypertension: Secondary | ICD-10-CM | POA: Diagnosis not present

## 2022-07-28 DIAGNOSIS — B1910 Unspecified viral hepatitis B without hepatic coma: Secondary | ICD-10-CM | POA: Diagnosis not present

## 2022-07-28 DIAGNOSIS — E119 Type 2 diabetes mellitus without complications: Secondary | ICD-10-CM | POA: Diagnosis not present

## 2022-08-02 ENCOUNTER — Telehealth: Payer: Self-pay | Admitting: Family Medicine

## 2022-08-02 DIAGNOSIS — E119 Type 2 diabetes mellitus without complications: Secondary | ICD-10-CM | POA: Diagnosis not present

## 2022-08-02 DIAGNOSIS — G7001 Myasthenia gravis with (acute) exacerbation: Secondary | ICD-10-CM | POA: Diagnosis not present

## 2022-08-02 DIAGNOSIS — H538 Other visual disturbances: Secondary | ICD-10-CM | POA: Diagnosis not present

## 2022-08-02 DIAGNOSIS — B1910 Unspecified viral hepatitis B without hepatic coma: Secondary | ICD-10-CM | POA: Diagnosis not present

## 2022-08-02 DIAGNOSIS — I10 Essential (primary) hypertension: Secondary | ICD-10-CM | POA: Diagnosis not present

## 2022-08-02 DIAGNOSIS — J9600 Acute respiratory failure, unspecified whether with hypoxia or hypercapnia: Secondary | ICD-10-CM | POA: Diagnosis not present

## 2022-08-02 NOTE — Telephone Encounter (Signed)
Pt called to confirm phone number for [facility]  [phone]

## 2022-08-02 NOTE — Telephone Encounter (Signed)
I spoke with patient; number given to behavioral health psychiatry side - [phone].

## 2022-08-02 NOTE — Telephone Encounter (Signed)
Pt seeking recommendation for a psychiatrist. Says [place] beh health only has counselors

## 2022-08-07 ENCOUNTER — Ambulatory Visit: Payer: Self-pay

## 2022-08-07 ENCOUNTER — Telehealth (HOSPITAL_COMMUNITY): Payer: Self-pay | Admitting: Professional

## 2022-08-07 ENCOUNTER — Other Ambulatory Visit: Payer: Self-pay | Admitting: Family Medicine

## 2022-08-07 NOTE — Patient Outreach (Signed)
  Care Coordination   Follow Up Visit Note   08/07/2022 Name: [name] MRN: [id number] DOB: January 18, 1956  [name] is a 67 y.o. year old male who sees [place], Timoteo Expose, MD for primary care. I spoke with  [name] by phone today.  What matters to the patients health and wellness today?  Getting better    Goals Addressed               This Visit's Progress     Get better to live normal (pt-stated)        Patient Goals/Self Care Activities: -Patient/Caregiver will self-administer medications as prescribed as evidenced by self-report/primary caregiver report  -Patient/Caregiver will attend all scheduled provider appointments as evidenced by clinician review of documented attendance to scheduled appointments and patient/caregiver report -Patient/Caregiver will call provider office for new concerns or questions as evidenced by review of documented incoming telephone call notes and patient report  -check blood sugar at prescribed times -check blood sugar if I feel it is too high or too low -record values and write them down take them to all doctor visits    Patient states doing better.  He still has some issues swallowing due to Mysthenia Gravis but he eats mainly liquids and soft foods.  Discussed swallow precautions. He verbalized understanding.  Patient has pending psychiatry referral due to PTSD  Blood sugars running between 101-126.  Discussed diabetes management.           SDOH assessments and interventions completed:  Yes     Care Coordination Interventions:  Yes, provided   Follow up plan: Follow up call scheduled for August    Encounter Outcome:  Pt. Visit Completed {THN Tip this will not be part of the note when signed-REQUIRED REPORT FIELD DO NOT DELETE (Optional):[id number]  Matthew Leriche, RN, MSN [facility] Care Management Care Management Coordinator Direct Line [phone]

## 2022-08-07 NOTE — Patient Instructions (Signed)
Visit Information  Thank you for taking time to visit with me today. Please don't hesitate to contact me if I can be of assistance to you.   Following are the goals we discussed today:   Goals Addressed               This Visit's Progress     Get better to live normal (pt-stated)        Patient Goals/Self Care Activities: -Patient/Caregiver will self-administer medications as prescribed as evidenced by self-report/primary caregiver report  -Patient/Caregiver will attend all scheduled provider appointments as evidenced by clinician review of documented attendance to scheduled appointments and patient/caregiver report -Patient/Caregiver will call provider office for new concerns or questions as evidenced by review of documented incoming telephone call notes and patient report  -check blood sugar at prescribed times -check blood sugar if I feel it is too high or too low -record values and write them down take them to all doctor visits    Patient states doing better.  He still has some issues swallowing due to Mysthenia Gravis but he eats mainly liquids and soft foods.  Discussed swallow precautions. He verbalized understanding.  Patient has pending psychiatry referral due to PTSD  Blood sugars running between 101-126.  Discussed diabetes management.           Our next appointment is by telephone on 08/31/22 at 1000 am  Please call the care guide team at [phone] if you need to cancel or reschedule your appointment.   If you are experiencing a Mental Health or Behavioral Health Crisis or need someone to talk to, please call the Suicide and Crisis Lifeline: 988   Patient verbalizes understanding of instructions and care plan provided today and agrees to view in MyChart. Active MyChart status and patient understanding of how to access instructions and care plan via MyChart confirmed with patient.     The patient has been provided with contact information for the care management  team and has been advised to call with any health related questions or concerns.   [name], RN, MSN [facility] Care Management Care Management Coordinator Direct Line [phone]

## 2022-08-07 NOTE — Telephone Encounter (Signed)
Cln rtn call. Pt reports he went through the [facility] in [place] in 1975. Denies therapy hx. Having nightmares. He reports he is looking for meds. He reports he would like for this cln to speak with Dr. Betty [place] (PCP that referred him to IOP) about if he should do the IOP or individual. He gives verbal consent for this therapist to reach out to Dr. [place]. This cln will send EPIC chat. This cln will also send Milind an email at [email or web address] (wife's email per pt) with referrals for individual MALE therapists and psychiatrists that offer in-person visits in [place] and accept Medicare. Cln provides number for Tung to call if has any more questions. He requests that this cln reach out to him after hearing back from Dr. [place]. This cln agrees. No SI/HI

## 2022-08-07 NOTE — Telephone Encounter (Signed)
Prescription Request  08/07/2022  LOV: 07/21/2022  What is the name of the medication or equipment? clonazePAM (KLONOPIN) 0.5 MG tablet   busPIRone (BUSPAR) 5 MG tablet  diltiazem (CARDIZEM) 90 MG tablet ALPRAZolam (XANAX) 0.5 MG tablet  PARoxetine (PAXIL) 20 MG tablet pantoprazole (PROTONIX) 40 MG tablet  Have you contacted your pharmacy to request a refill? No   Which pharmacy would you like this sent to?  [facility] [id number] [place], [place] - [address] [place] [address] Phone: [phone] Fax: [phone]   Patient notified that their request is being sent to the clinical staff for review and that they should receive a response within 2 business days.   Please advise at Mobile [phone] (mobile)

## 2022-08-07 NOTE — Telephone Encounter (Signed)
Last refill for Xanax and Clonazepam 07/17/22. BJ

## 2022-08-08 DIAGNOSIS — H538 Other visual disturbances: Secondary | ICD-10-CM | POA: Diagnosis not present

## 2022-08-08 DIAGNOSIS — B1910 Unspecified viral hepatitis B without hepatic coma: Secondary | ICD-10-CM | POA: Diagnosis not present

## 2022-08-08 DIAGNOSIS — I10 Essential (primary) hypertension: Secondary | ICD-10-CM | POA: Diagnosis not present

## 2022-08-08 DIAGNOSIS — J9600 Acute respiratory failure, unspecified whether with hypoxia or hypercapnia: Secondary | ICD-10-CM | POA: Diagnosis not present

## 2022-08-08 DIAGNOSIS — E119 Type 2 diabetes mellitus without complications: Secondary | ICD-10-CM | POA: Diagnosis not present

## 2022-08-08 DIAGNOSIS — G7001 Myasthenia gravis with (acute) exacerbation: Secondary | ICD-10-CM | POA: Diagnosis not present

## 2022-08-09 ENCOUNTER — Encounter: Payer: Self-pay | Admitting: Internal Medicine

## 2022-08-09 ENCOUNTER — Ambulatory Visit: Payer: Medicare Other | Admitting: Internal Medicine

## 2022-08-09 VITALS — BP 124/80 | HR 90 | Ht 65.0 in | Wt 106.0 lb

## 2022-08-09 DIAGNOSIS — E119 Type 2 diabetes mellitus without complications: Secondary | ICD-10-CM

## 2022-08-09 LAB — POCT GLYCOSYLATED HEMOGLOBIN (HGB A1C): Hemoglobin A1C: 5.8 % — AB (ref 4.0–5.6)

## 2022-08-09 LAB — POCT GLUCOSE (DEVICE FOR HOME USE): POC Glucose: 192 mg/dl — AB (ref 70–99)

## 2022-08-09 MED ORDER — REPAGLINIDE 0.5 MG PO TABS: 0.5000 mg | ORAL_TABLET | Freq: Every day | ORAL | 3 refills | Status: DC

## 2022-08-09 MED ORDER — METFORMIN HCL 500 MG PO TABS: 1000.0000 mg | ORAL_TABLET | Freq: Two times a day (BID) | ORAL | 3 refills | Status: DC

## 2022-08-09 MED ORDER — SITAGLIPTIN PHOSPHATE 100 MG PO TABS: 100.0000 mg | ORAL_TABLET | Freq: Every day | ORAL | 3 refills | Status: DC

## 2022-08-09 NOTE — Progress Notes (Signed)
Name: [name]  Age/ Sex: 67 y.o., male   MRN/ DOB: [id number], Mar 13, 1955     PCP: [place], [name], MD   Reason for Endocrinology Evaluation: Type 2 Diabetes Mellitus  Initial Endocrine Consultative Visit: 03/06/2012    PATIENT IDENTIFIER: [name] is a 67 y.o. male with a past medical history of DM, HTN , Myasthenia Gravis and Hep B chronic infection the patient has followed with Endocrinology clinic since 03/06/2012 for consultative assistance with management of his diabetes.  DIABETIC HISTORY:  [name] was diagnosed with DM 2005. His hemoglobin A1c has ranged from 5.8% in 2022, peaking at 6.5% in 2018.     He was followed by Dr, Everardo All from 2014 until 05/02/2021  SUBJECTIVE:   During the last visit (02/03/2022): A1c 5.9%  Today (08/09/2022): [name] is here for follow-up on diabetes management.  He checks his blood sugars  times daily. The patient has  had hypoglycemic episodes since the last clinic visit, he is symptomatic with these episodes, they occur after supper    Patient follows with [facility] neurology for myasthenia gravis, s/p thymectomy 2004 with malignant thymoma treated with radiation Patient follows with [facility] and transplant for hepatitis B   Patient presented to the ED with myasthenia crisis 05/2022, patient was intubated for airway protection and due to complaints of shortness of breath  Patient is on prednisone through neurology  He eats 3 meals a day   Denies nausea, vomiting Denies constipation  or diarrhea    HOME ENDOCRINE REGIMEN:  Januvia 100 mg daily Metformin 500 mg, 2 tabs twice daily Repaglinide 0.5 mg, 1 tablet before supper Prednisone 15 mg daily per neurology    Statin: No ACE-I/ARB: Yes   METER DOWNLOAD SUMMARY: n/a    DIABETIC COMPLICATIONS: Microvascular complications:   Denies: CKD , retinopathy,neuropathy Last Eye Exam: Completed 2023  Macrovascular complications:   Denies: CAD, CVA,  PVD   HISTORY:  Past Medical History:  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 He has never used smokeless tobacco. He reports that he does not drink alcohol and does not use drugs. Family History: No family history on file.   HOME MEDICATIONS: Allergies as of 08/09/2022       Reactions   Azithromycin Other (See Comments)   REACTION: exac of his mg   Beta Adrenergic Blockers Other (See Comments)   REACTION: exac of mg   Calcium Channel Blockers Other (See Comments)   Medications that may worsen or trigger MG exacerbation: Class IA antiarrhythmics, magnesium, flouroquinolones, macrolides, aminoglycosides, penicillamine, curare, interferon alpha, botox, quinine. Use with caution: calcium channel blockers, beta blockers and statins.    Macrolides And Ketolides Other (See Comments)  Medications that may worsen or trigger MG exacerbation: Class IA antiarrhythmics, magnesium, flouroquinolones, macrolides, aminoglycosides, penicillamine, curare, interferon alpha, botox, quinine. Use with caution: calcium channel blockers, beta blockers and statins.    Statins Other (See Comments)   Medications that may worsen or trigger MG exacerbation: Class IA antiarrhythmics, magnesium,  flouroquinolones, macrolides, aminoglycosides, penicillamine, curare, interferon alpha, botox, quinine. Use with caution: calcium channel blockers, beta blockers and statins.         Medication List        Accurate as of August 09, 2022  8:07 AM. If you have any questions, ask your nurse or doctor.          ALPRAZolam 0.5 MG tablet Commonly known as: XANAX Take 1 tablet (0.5 mg total) by mouth daily as needed for anxiety.   busPIRone 5 MG tablet Commonly known as: BUSPAR Take 1 tablet (5 mg total) by mouth 3 (three) times daily.   clonazePAM 0.5 MG tablet Commonly known as: KLONOPIN Take 1 tablet (0.5 mg total) by mouth 2 (two) times daily.   desonide 0.05 % gel Commonly known as: DESONATE Apply 1 application topically 2 (two) times daily as needed.   diltiazem 90 MG tablet Commonly known as: CARDIZEM Take 0.5 tablets (45 mg total) by mouth every 8 (eight) hours.   entecavir 1 MG tablet Commonly known as: BARACLUDE Take 1 tablet (1 mg total) by mouth daily.   magic mouthwash w/lidocaine Soln Take 5 mLs by mouth 4 (four) times daily, discard after 14 days   metFORMIN 500 MG tablet Commonly known as: GLUCOPHAGE Take 1 tablet (500 mg total) by mouth 2 (two) times daily with a meal.   multivitamin with minerals Tabs tablet Take 1 tablet by mouth daily.   OneTouch Ultra test strip Generic drug: glucose blood USE TO TEST BLOOD SUGAR 3 TIMES DAILY   onetouch ultrasoft lancets USE AS INSTRUCTED TO CHECK BLOOD SUGARS THREE TIMES A DAY   pantoprazole 40 MG tablet Commonly known as: PROTONIX Take 1 tablet (40 mg total) by mouth 2 (two) times daily.   PARoxetine 20 MG tablet Commonly known as: PAXIL Take 1 tablet (20 mg total) by mouth daily.   predniSONE 10 MG tablet Commonly known as: DELTASONE Take 1.5 tablets (15 mg total) by mouth daily with breakfast.   pyridostigmine 60 MG tablet Commonly known as: MESTINON Take 60 mg by mouth 3 (three) times daily.    Vitamin D 50 MCG (2000 UT) tablet Take 2,000 Units by mouth daily.         OBJECTIVE:   Vital Signs: BP 124/80 (BP Location: Left Arm, Patient Position: Sitting, Cuff Size: Small)   Pulse 90   Ht 5\' 5"  (1.651 m)   Wt 106 lb (48.1 kg)   SpO2 99%   BMI 17.64 kg/m   Wt Readings from Last 3 Encounters:  08/09/22 106 lb (48.1 kg)  07/21/22 110 lb (49.9 kg)  07/18/22 115 lb 4.8 oz (52.3 kg)     Exam: General: Pt appears well and is in NAD  Lungs: Clear with good BS bilat   Heart: RRR   Extremities: No pretibial edema.   Neuro: MS is good with appropriate affect, pt is alert and Ox3   DM FOOT EXAM 08/09/2022   The skin of the feet is intact without sores or ulcerations. The pedal pulses are 2+ on right and 2+ on left. The sensation is intact to a screening 5.07, 10 gram monofilament bilaterally   DATA  REVIEWED:  Lab Results  Component Value Date   HGBA1C 5.7 (H) 06/16/2022   HGBA1C 5.9 (A) 02/03/2022   HGBA1C 5.9 (A) 05/02/2021    Latest Reference Range & Units 07/21/22 12:43  Sodium 135 - 145 mEq/L 136  Potassium 3.5 - 5.1 mEq/L 4.3  Chloride 96 - 112 mEq/L 103  CO2 19 - 32 mEq/L 25  Glucose 70 - 99 mg/dL 962 (H)  BUN 6 - 23 mg/dL 11  Creatinine 9.52 - 8.41 mg/dL 3.24  Calcium 8.4 - 40.1 mg/dL 8.8  Alkaline Phosphatase 39 - 117 U/L 43  Albumin 3.5 - 5.2 g/dL 3.7  AST 0 - 37 U/L 15  ALT 0 - 53 U/L 12  Total Protein 6.0 - 8.3 g/dL 7.0  Bilirubin, Direct 0.0 - 0.3 mg/dL 0.1  Total Bilirubin 0.2 - 1.2 mg/dL 0.3  GFR >02.72 mL/min 62.62  (H): Data is abnormally high  Old records , labs and images have been reviewed.   ASSESSMENT / PLAN / RECOMMENDATIONS:   1) Type 2 Diabetes Mellitus, Optimally controlled, Without complications - Most recent A1c of 5.8 %. Goal A1c < 7.0 %.     -Patient on chronic prednisone therapy due to myasthenia gravis -During hospitalization for myasthenia gravis he was discharged on metformin only, colesevelam, Januvia, and  pregnant were discontinued, patient has restarted Januvia -He does endorse hypoglycemia episode last month with BG in the 60s after supper, patient does endorse decreased appetite and is on soft diet -His postprandial BG today 192 mg/DL -Patient will remain off colesevelam -He will continue Januvia before breakfast -He will continue metformin before breakfast and supper -Patient advised to hold repaglinide at this time until he is back to his baseline appetite and regular food -His most recent BMP was normal   MEDICATIONS: Continue metformin 500 mg 2 tabs twice daily Continue Januvia 100 mg daily Take repaglinide 0.5 mg, 1 tab before supper, when on regular diet  EDUCATION / INSTRUCTIONS: BG monitoring instructions: Patient is instructed to check his blood sugars 1 times a day, may alternate before breakfast, lunch, and supper. Call [place] [facility] if: BG persistently < 70  I reviewed the Rule of 15 for the treatment of hypoglycemia in detail with the patient. Literature supplied.    2) Diabetic complications:  Eye: Does not have known diabetic retinopathy.  Neuro/ Feet: Does not have known diabetic peripheral neuropathy .  Renal: Patient does not have known baseline CKD. He   is  on an ACEI/ARB at present.     F/U in 6 months   Signed electronically by: Lyndle Herrlich, MD  [facility]  [facility] [address] [place] [place], [place] [address] Phone: [phone] FAX: [phone]   CC: [place], [name], MD [address] [place] [address] Phone: [phone]  Fax: [phone]  Return to Endocrinology clinic as below: Future Appointments  Date Time Provider Department Center  08/09/2022  8:30 AM Chan Sheahan, Konrad Dolores, MD [facility] None  08/18/2022 11:40 AM Genice Rouge, MD [facility] CPR  08/31/2022 10:00 AM Fleeta Emmer, RN [facility] None

## 2022-08-09 NOTE — Patient Instructions (Addendum)
Continue Januvia 100 mg daily with Breakfast Continue Metformin 500 mg, 2 tablets with Breakfast and 2 tablet with supper Take Repaglinide 0.5 mg , 1 tablet before dinner ( one you start eating regular food)    HOW TO TREAT LOW BLOOD SUGARS (Blood sugar LESS THAN 70 MG/DL) Please follow the RULE OF 15 for the treatment of hypoglycemia treatment (when your (blood sugars are less than 70 mg/dL)   STEP 1: Take 15 grams of carbohydrates when your blood sugar is low, which includes:  3-4 GLUCOSE TABS  OR 3-4 OZ OF JUICE OR REGULAR SODA OR ONE TUBE OF GLUCOSE GEL    STEP 2: RECHECK blood sugar in 15 MINUTES STEP 3: If your blood sugar is still low at the 15 minute recheck --> then, go back to STEP 1 and treat AGAIN with another 15 grams of carbohydrates.

## 2022-08-11 ENCOUNTER — Telehealth (HOSPITAL_COMMUNITY): Payer: Self-pay | Admitting: Psychiatry

## 2022-08-13 MED ORDER — BUSPIRONE HCL 5 MG PO TABS: 5.0000 mg | ORAL_TABLET | Freq: Three times a day (TID) | ORAL | 1 refills | Status: DC

## 2022-08-13 MED ORDER — PANTOPRAZOLE SODIUM 40 MG PO TBEC: 40.0000 mg | DELAYED_RELEASE_TABLET | Freq: Two times a day (BID) | ORAL | 1 refills | Status: DC

## 2022-08-13 MED ORDER — DILTIAZEM HCL 90 MG PO TABS: 45.0000 mg | ORAL_TABLET | Freq: Three times a day (TID) | ORAL | 0 refills | Status: DC

## 2022-08-13 MED ORDER — PAROXETINE HCL 20 MG PO TABS: 20.0000 mg | ORAL_TABLET | Freq: Every day | ORAL | 0 refills | Status: DC

## 2022-08-13 MED ORDER — ALPRAZOLAM 0.5 MG PO TABS: 0.5000 mg | ORAL_TABLET | Freq: Every evening | ORAL | 1 refills | Status: DC | PRN

## 2022-08-13 MED ORDER — CLONAZEPAM 0.5 MG PO TABS: 0.5000 mg | ORAL_TABLET | Freq: Two times a day (BID) | ORAL | 1 refills | Status: DC

## 2022-08-15 DIAGNOSIS — H538 Other visual disturbances: Secondary | ICD-10-CM | POA: Diagnosis not present

## 2022-08-15 DIAGNOSIS — B1910 Unspecified viral hepatitis B without hepatic coma: Secondary | ICD-10-CM | POA: Diagnosis not present

## 2022-08-15 DIAGNOSIS — I10 Essential (primary) hypertension: Secondary | ICD-10-CM | POA: Diagnosis not present

## 2022-08-15 DIAGNOSIS — J9600 Acute respiratory failure, unspecified whether with hypoxia or hypercapnia: Secondary | ICD-10-CM | POA: Diagnosis not present

## 2022-08-15 DIAGNOSIS — G7001 Myasthenia gravis with (acute) exacerbation: Secondary | ICD-10-CM | POA: Diagnosis not present

## 2022-08-15 DIAGNOSIS — E119 Type 2 diabetes mellitus without complications: Secondary | ICD-10-CM | POA: Diagnosis not present

## 2022-08-18 ENCOUNTER — Encounter: Payer: Self-pay | Admitting: Physical Medicine and Rehabilitation

## 2022-08-18 ENCOUNTER — Encounter
Payer: Medicare Other | Attending: Physical Medicine and Rehabilitation | Admitting: Physical Medicine and Rehabilitation

## 2022-08-18 ENCOUNTER — Other Ambulatory Visit: Payer: Self-pay | Admitting: Internal Medicine

## 2022-08-18 VITALS — BP 120/74 | HR 96 | Ht 65.0 in | Wt 105.0 lb

## 2022-08-18 DIAGNOSIS — Z86010 Personal history of colonic polyps: Secondary | ICD-10-CM | POA: Diagnosis not present

## 2022-08-18 DIAGNOSIS — G7001 Myasthenia gravis with (acute) exacerbation: Secondary | ICD-10-CM | POA: Diagnosis not present

## 2022-08-18 DIAGNOSIS — K219 Gastro-esophageal reflux disease without esophagitis: Secondary | ICD-10-CM | POA: Diagnosis not present

## 2022-08-18 DIAGNOSIS — J9600 Acute respiratory failure, unspecified whether with hypoxia or hypercapnia: Secondary | ICD-10-CM | POA: Diagnosis not present

## 2022-08-18 DIAGNOSIS — E43 Unspecified severe protein-calorie malnutrition: Secondary | ICD-10-CM | POA: Diagnosis not present

## 2022-08-18 DIAGNOSIS — K644 Residual hemorrhoidal skin tags: Secondary | ICD-10-CM | POA: Diagnosis not present

## 2022-08-18 DIAGNOSIS — Z7952 Long term (current) use of systemic steroids: Secondary | ICD-10-CM | POA: Diagnosis not present

## 2022-08-18 DIAGNOSIS — H02401 Unspecified ptosis of right eyelid: Secondary | ICD-10-CM | POA: Diagnosis not present

## 2022-08-18 DIAGNOSIS — E559 Vitamin D deficiency, unspecified: Secondary | ICD-10-CM | POA: Diagnosis not present

## 2022-08-18 DIAGNOSIS — Z681 Body mass index (BMI) 19 or less, adult: Secondary | ICD-10-CM | POA: Diagnosis not present

## 2022-08-18 DIAGNOSIS — Z7984 Long term (current) use of oral hypoglycemic drugs: Secondary | ICD-10-CM | POA: Diagnosis not present

## 2022-08-18 DIAGNOSIS — Z556 Problems related to health literacy: Secondary | ICD-10-CM | POA: Diagnosis not present

## 2022-08-18 DIAGNOSIS — H538 Other visual disturbances: Secondary | ICD-10-CM | POA: Diagnosis not present

## 2022-08-18 DIAGNOSIS — I10 Essential (primary) hypertension: Secondary | ICD-10-CM | POA: Diagnosis not present

## 2022-08-18 DIAGNOSIS — E119 Type 2 diabetes mellitus without complications: Secondary | ICD-10-CM | POA: Diagnosis not present

## 2022-08-18 DIAGNOSIS — F339 Major depressive disorder, recurrent, unspecified: Secondary | ICD-10-CM | POA: Diagnosis not present

## 2022-08-18 DIAGNOSIS — E7849 Other hyperlipidemia: Secondary | ICD-10-CM | POA: Diagnosis not present

## 2022-08-18 DIAGNOSIS — R29810 Facial weakness: Secondary | ICD-10-CM | POA: Diagnosis not present

## 2022-08-18 DIAGNOSIS — Z85238 Personal history of other malignant neoplasm of thymus: Secondary | ICD-10-CM | POA: Diagnosis not present

## 2022-08-18 DIAGNOSIS — K648 Other hemorrhoids: Secondary | ICD-10-CM | POA: Diagnosis not present

## 2022-08-18 DIAGNOSIS — G4700 Insomnia, unspecified: Secondary | ICD-10-CM | POA: Diagnosis not present

## 2022-08-18 DIAGNOSIS — F419 Anxiety disorder, unspecified: Secondary | ICD-10-CM | POA: Diagnosis not present

## 2022-08-18 DIAGNOSIS — B1910 Unspecified viral hepatitis B without hepatic coma: Secondary | ICD-10-CM | POA: Diagnosis not present

## 2022-08-18 NOTE — Patient Instructions (Signed)
Pt is a 67 yr old male with hx of Myasthenia gravis- s/p acute exacerbation 07/2022; had resp failure- also has anxiety and DM- A1c 5.7; Hepatitis B; Hypotension- And Anemia;   Here for hospital f/u on myasthenia gravis   Doing great.   2. Asking PCP when can stop more meds!   3. Not taking Klonopin anymore- needed in hospital, not lately. Hasn't needed anymore ,  4. Paxil is new medicine and not anxious anymore- suggest- PCP wean off.    5.  F/U as needed

## 2022-08-18 NOTE — Progress Notes (Signed)
Subjective:    Patient ID: [name], male    DOB: 03-16-1955, 67 y.o.   MRN: [id number]  HPI  Pt is a 67 yr old male with hx of Myasthenia gravis- s/p acute exacerbation 07/2022; had resp failure- also has anxiety and DM- A1c 5.7; Hepatitis B; Hypotension- And Anemia;   Here for hospital f/u on myasthenia gravis    Is getting better lately.  Walking with Rolator.   Can walk 10-20 minutes before takes break.  Needs to sit down and take a break.   No pain.   Has seen Dr [place] as soon as left the hospital. Hb up to 8.7! Doing better. K+ up to 4.3 and Na 136- so both doing better.    Still doing PT- doing H/H- helps him a lot.  No more breathing problems.  Still trauma from being in hospital and thebreathing issues/intubation.   No more meds required for MG except regular meds.    Pain Inventory Average Pain 0 Pain Right Now 0 My pain is  no pain  In the last 24 hours, has pain interfered with the following? General activity 0 Relation with others 0 Enjoyment of life 0 What TIME of day is your pain at its worst? No pain Sleep (in general) Good  Pain is worse with:  no pain Pain improves with:  no pain Relief from Meds:  no pain meds  walk with assistance use a walker how many minutes can you walk? 5-10 ability to climb steps?  yes do you drive?  no Do you have any goals in this area?  yes  retired  trouble walking  6/24  Hospital follow-up    No family history on file. Social History   Socioeconomic History   Marital status: Married    Spouse name: Not on file   Number of children: Not on file   Years of education: Not on file   Highest education level: Not on file  Occupational History   Not on file  Tobacco Use   Smoking status: Never   Smokeless tobacco: Never  Substance and Sexual Activity   Alcohol use: No   Drug use: No   Sexual activity: Not Currently  Other Topics Concern   Not on file  Social History Narrative   Not on  file   Social Determinants of Health   Financial Resource Strain: Not on file  Food Insecurity: No Food Insecurity (07/24/2022)   Hunger Vital Sign    Worried About Running Out of Food in the Last Year: Never true    Ran Out of Food in the Last Year: Never true  Recent Concern: Food Insecurity - Food Insecurity Present (06/16/2022)   Hunger Vital Sign    Worried About Running Out of Food in the Last Year: Sometimes true    Ran Out of Food in the Last Year: Never true  Transportation Needs: No Transportation Needs (07/19/2022)   PRAPARE - Administrator, Civil Service (Medical): No    Lack of Transportation (Non-Medical): No  Physical Activity: Not on file  Stress: Not on file  Social Connections: Not on file   Past Surgical History:  Procedure Laterality Date   THYMECTOMY     Past Medical History:  Diagnosis Date   Anxiety    Chronic post-traumatic stress disorder (PTSD)    Colon polyp    Depressive disorder, not elsewhere classified    External hemorrhoid    Insomnia, unspecified  Internal hemorrhoids    Myasthenia gravis without exacerbation (HCC)    Other and unspecified hyperlipidemia    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  Opioid Risk Score:   Fall Risk Score:  `1  Depression screen [facility] 2/9     07/24/2022    2:29 PM 06/21/2022    9:22 AM 10/04/2021    9:02 AM 03/04/2021    8:13 AM 01/07/2021   11:36 AM 12/27/2019    9:02 PM 12/21/2018    3:33 PM  Depression screen PHQ 2/9  Decreased Interest 0 3 2 2 1  0 0  Down, Depressed, Hopeless 0 3 2 2 1  0 0  PHQ - 2 Score 0 6 4 4 2  0 0  Altered sleeping  3 2 2 1  0   Tired, decreased energy  3 2 2 1  0   Change in appetite   3 1 1 1  0   Feeling bad or failure about yourself   3 2 2 1  0   Trouble concentrating  3 3 3 3 1    Moving slowly or fidgety/restless  3 1 2  0 0   Suicidal thoughts  0 1 1 0 0   PHQ-9 Score  24 16 17 9 1    Difficult doing work/chores  Extremely dIfficult Somewhat difficult Very difficult  Somewhat difficult       Review of Systems  All other systems reviewed and are negative.      Objective:   Physical Exam  Awake, alert, appropriate, accompanied by wife, using Rolator to walk, NAD no signs of anxiety Deltoids 5-/5 and HF 5-/5 otherwise Ue's and LE's 5/5 B/L  Gait- great, rapid/ basically normal gait.       Assessment & Plan:   Pt is a 67 yr old male with hx of Myasthenia gravis- s/p acute exacerbation 07/2022; had resp failure- also has anxiety and DM- A1c 5.7; Hepatitis B; Hypotension- And Anemia;   Here for hospital f/u on myasthenia gravis   Doing great.   2. Asking PCP when can stop more meds!   3. Not taking Klonopin anymore- needed in hospital, not lately. Hasn't needed anymore ,  4. Paxil is new medicine and not anxious anymore- suggest- PCP wean off.    5.  F/U as needed

## 2022-08-23 DIAGNOSIS — E119 Type 2 diabetes mellitus without complications: Secondary | ICD-10-CM | POA: Diagnosis not present

## 2022-08-23 DIAGNOSIS — B1910 Unspecified viral hepatitis B without hepatic coma: Secondary | ICD-10-CM | POA: Diagnosis not present

## 2022-08-23 DIAGNOSIS — G7001 Myasthenia gravis with (acute) exacerbation: Secondary | ICD-10-CM | POA: Diagnosis not present

## 2022-08-23 DIAGNOSIS — H538 Other visual disturbances: Secondary | ICD-10-CM | POA: Diagnosis not present

## 2022-08-23 DIAGNOSIS — J9600 Acute respiratory failure, unspecified whether with hypoxia or hypercapnia: Secondary | ICD-10-CM | POA: Diagnosis not present

## 2022-08-23 DIAGNOSIS — I10 Essential (primary) hypertension: Secondary | ICD-10-CM | POA: Diagnosis not present

## 2022-08-24 ENCOUNTER — Telehealth: Payer: Self-pay

## 2022-08-24 ENCOUNTER — Telehealth: Payer: Self-pay | Admitting: Family Medicine

## 2022-08-24 NOTE — Telephone Encounter (Signed)
Pharmacy states that Diltiazem was prescribed by different provider after it was d/c by you. Would like to know if you want patient to be on medication.

## 2022-08-24 NOTE — Telephone Encounter (Signed)
Joellyn Haff intern  at [facility] [employer] is calling and Dr Lonzo Cloud d/c diltiazem and intern is calling dr [place] refilled medication on 08-13-2022. Please contact [facility]. [facility] is aware md not in office today  [facility] [id number] - [place], [place] - [address] Phone: [phone]  Fax: [phone]

## 2022-08-29 NOTE — Telephone Encounter (Signed)
I called and spoke with pharmacy. [facility] is aware.

## 2022-08-29 NOTE — Telephone Encounter (Signed)
Diltiazem is still on his medication list as an active medication, so continue for now. Keep follow up appt. Thanks, BJ

## 2022-08-30 DIAGNOSIS — B1910 Unspecified viral hepatitis B without hepatic coma: Secondary | ICD-10-CM | POA: Diagnosis not present

## 2022-08-30 DIAGNOSIS — H538 Other visual disturbances: Secondary | ICD-10-CM | POA: Diagnosis not present

## 2022-08-30 DIAGNOSIS — J9600 Acute respiratory failure, unspecified whether with hypoxia or hypercapnia: Secondary | ICD-10-CM | POA: Diagnosis not present

## 2022-08-30 DIAGNOSIS — I10 Essential (primary) hypertension: Secondary | ICD-10-CM | POA: Diagnosis not present

## 2022-08-30 DIAGNOSIS — E119 Type 2 diabetes mellitus without complications: Secondary | ICD-10-CM | POA: Diagnosis not present

## 2022-08-30 DIAGNOSIS — G7001 Myasthenia gravis with (acute) exacerbation: Secondary | ICD-10-CM | POA: Diagnosis not present

## 2022-08-31 ENCOUNTER — Ambulatory Visit: Payer: Self-pay

## 2022-08-31 NOTE — Patient Outreach (Signed)
  Care Coordination   Follow Up Visit Note   08/31/2022 Name: Taher Wilch MRN: [id number] DOB: 05-15-1955  Faizon Gravlin is a 67 y.o. year old male who sees [place], Timoteo Expose, MD for primary care. I spoke with  [name] by phone today.  What matters to the patients health and wellness today?  Getting better    Goals Addressed               This Visit's Progress     Get better to live normal (pt-stated)        Patient Goals/Self Care Activities: -Patient/Caregiver will self-administer medications as prescribed as evidenced by self-report/primary caregiver report  -Patient/Caregiver will attend all scheduled provider appointments as evidenced by clinician review of documented attendance to scheduled appointments and patient/caregiver report -Patient/Caregiver will call provider office for new concerns or questions as evidenced by review of documented incoming telephone call notes and patient report  -check blood sugar at prescribed times -check blood sugar if I feel it is too high or too low -record values and write them down take them to all doctor visits    Patient states doing good.  He still has some issues swallowing due to Mysthenia Gravis but he eats mainly soft foods.  Discussed swallow precautions. He verbalized understanding.  Patient has psychiatry  appointment next month due to PTSD  Blood sugars running between 90-130.  Reiterated diabetes management.           SDOH assessments and interventions completed:  Yes     Care Coordination Interventions:  Yes, provided   Follow up plan: Follow up call scheduled for September    Encounter Outcome:  Pt. Visit Completed   [name], RN, MSN [facility] Care Management Care Management Coordinator Direct Line [phone]

## 2022-08-31 NOTE — Patient Instructions (Signed)
Visit Information  Thank you for taking time to visit with me today. Please don't hesitate to contact me if I can be of assistance to you.   Following are the goals we discussed today:   Goals Addressed               This Visit's Progress     Get better to live normal (pt-stated)        Patient Goals/Self Care Activities: -Patient/Caregiver will self-administer medications as prescribed as evidenced by self-report/primary caregiver report  -Patient/Caregiver will attend all scheduled provider appointments as evidenced by clinician review of documented attendance to scheduled appointments and patient/caregiver report -Patient/Caregiver will call provider office for new concerns or questions as evidenced by review of documented incoming telephone call notes and patient report  -check blood sugar at prescribed times -check blood sugar if I feel it is too high or too low -record values and write them down take them to all doctor visits    Patient states doing good.  He still has some issues swallowing due to Mysthenia Gravis but he eats mainly soft foods.  Discussed swallow precautions. He verbalized understanding.  Patient has psychiatry  appointment next month due to PTSD  Blood sugars running between 90-130.  Reiterated diabetes management.           Our next appointment is by telephone on 10/05/22 at 1100 am  Please call the care guide team at [phone] if you need to cancel or reschedule your appointment.   If you are experiencing a Mental Health or Behavioral Health Crisis or need someone to talk to, please call the Suicide and Crisis Lifeline: 988   The patient verbalized understanding of instructions, educational materials, and care plan provided today and DECLINED offer to receive copy of patient instructions, educational materials, and care plan.   The patient has been provided with contact information for the care management team and has been advised to call with any  health related questions or concerns.   [name], RN, MSN [facility] Care Management Care Management Coordinator Direct Line [phone]

## 2022-09-06 DIAGNOSIS — G7001 Myasthenia gravis with (acute) exacerbation: Secondary | ICD-10-CM | POA: Diagnosis not present

## 2022-09-06 DIAGNOSIS — B1910 Unspecified viral hepatitis B without hepatic coma: Secondary | ICD-10-CM | POA: Diagnosis not present

## 2022-09-06 DIAGNOSIS — H538 Other visual disturbances: Secondary | ICD-10-CM | POA: Diagnosis not present

## 2022-09-06 DIAGNOSIS — J9600 Acute respiratory failure, unspecified whether with hypoxia or hypercapnia: Secondary | ICD-10-CM | POA: Diagnosis not present

## 2022-09-06 DIAGNOSIS — I10 Essential (primary) hypertension: Secondary | ICD-10-CM | POA: Diagnosis not present

## 2022-09-06 DIAGNOSIS — E119 Type 2 diabetes mellitus without complications: Secondary | ICD-10-CM | POA: Diagnosis not present

## 2022-09-12 DIAGNOSIS — I10 Essential (primary) hypertension: Secondary | ICD-10-CM | POA: Diagnosis not present

## 2022-09-12 DIAGNOSIS — H538 Other visual disturbances: Secondary | ICD-10-CM | POA: Diagnosis not present

## 2022-09-12 DIAGNOSIS — B1910 Unspecified viral hepatitis B without hepatic coma: Secondary | ICD-10-CM | POA: Diagnosis not present

## 2022-09-12 DIAGNOSIS — E119 Type 2 diabetes mellitus without complications: Secondary | ICD-10-CM | POA: Diagnosis not present

## 2022-09-12 DIAGNOSIS — J9600 Acute respiratory failure, unspecified whether with hypoxia or hypercapnia: Secondary | ICD-10-CM | POA: Diagnosis not present

## 2022-09-12 DIAGNOSIS — G7001 Myasthenia gravis with (acute) exacerbation: Secondary | ICD-10-CM | POA: Diagnosis not present

## 2022-09-20 NOTE — Progress Notes (Unsigned)
HPI: [name] is a 67 y.o. male with PMHx significant for HTN, DM II,sinus tach,myasthenia gravis,depression, anxiety, PTSD,chronic hep B infection, and HLD here today with his wife for follow-up. He was last seen on 07/21/2022 after hospital discharge.  No new problems since his last visit. Since his last visit he has followed with neurology, 08/18/22. Myasthenia gravis with symptoms of worsening ptosis and morning weakness, managed with Pyridostigmine and prednisone, states that the latter one was decreased from 15 mg to 10 mg.   Hypertension and sinus tachycardia:He is on Diltiazem 90 mg 1/2 tab tid, which has helped to control HR. Home BP readings "good." HR's at home < 100/min. Experiencing itching, unsure if medication related. He has had this problem in the past, attributed to other medications. Hx of atopic dermatitis and eczema, he follows with dermatologist.  He denies unusual headache, visual changes, CP, dyspnea, orthopnea, PND, new focal weakness, or lower extremity edema. Lab Results  Component Value Date   NA 136 07/21/2022   CL 103 07/21/2022   K 4.3 07/21/2022   CO2 25 07/21/2022   BUN 11 07/21/2022   CREATININE 1.21 07/21/2022   GFR 62.62 07/21/2022   CALCIUM 8.8 07/21/2022   PHOS 3.4 06/26/2022   ALBUMIN 3.7 07/21/2022   GLUCOSE 239 (H) 07/21/2022   PTSD, anxiety, and depression: Since his last visit he has discontinue paroxetine. According to pt, he was told by his neurologist that he did not need to take Paroxetine or see psychiatrist.  He has an appt with psychiatrist in 10/2022, considering canceling it. Prior to lat hospitalization he was on Prozac, anxiety and PTSD were not well controlled.  Insomnia has improved, sleeping about 6 to 7 hours at night and taking naps during the day. He is still takes Xanax0.5 mg as needed if he cannot sleep. Clonazepam 0.5 mg bid and Buspar 5 mg tid. Denies depressed mood. His wife reports persistent anxiety  aggravated by memories of [place] ward.  Reports hemorrhoid issues, "swell up" and protrude through rectum with walking/lifting,squatting,and also with defecation. He attributes symptoms to hemorrhoids. Negative for constipation. He has not noted rectal bleed or dyschezia. He has not had a colonoscopy in years, does not want to repeat.  Colonoscopy 2008 and FOBT negative in 07/2022  Wears diaper for occasional stool/urine incontinence.  He is also inquiring if he can change Pantoprazole to Omeprazole and decrease dose. He has not noted dysphagia,heartburn,N/V,or abdominal pain.  Review of Systems  Constitutional:  Positive for fatigue. Negative for chills and fever.  HENT:  Negative for mouth sores and sore throat.   Respiratory:  Negative for cough and wheezing.   Endocrine: Negative for cold intolerance and heat intolerance.  Genitourinary:  Negative for decreased urine volume, dysuria and hematuria.  Neurological:  Negative for seizures and syncope.  Psychiatric/Behavioral:  Negative for confusion. The patient is nervous/anxious.   See other pertinent positives and negatives in HPI.  Current Outpatient Medications on File Prior to Visit  Medication Sig Dispense Refill   ALPRAZolam (XANAX) 0.5 MG tablet Take 1 tablet (0.5 mg total) by mouth at bedtime as needed for anxiety. 30 tablet 1   busPIRone (BUSPAR) 5 MG tablet Take 1 tablet (5 mg total) by mouth 3 (three) times daily. 270 tablet 1   Cholecalciferol (VITAMIN D) 2000 UNITS tablet Take 2,000 Units by mouth daily.     clonazePAM (KLONOPIN) 0.5 MG tablet Take 1 tablet (0.5 mg total) by mouth 2 (two) times daily. 60 tablet  1   desonide (DESONATE) 0.05 % gel Apply 1 application topically 2 (two) times daily as needed.      diltiazem (CARDIZEM) 90 MG tablet Take 0.5 tablets (45 mg total) by mouth every 8 (eight) hours. 135 tablet 0   entecavir (BARACLUDE) 1 MG tablet Take 1 tablet (1 mg total) by mouth daily. 30 tablet 6   glucose  blood (ONETOUCH ULTRA) test strip USE TO TEST BLOOD SUGAR 3 TIMES DAILY 100 strip 11   Lancets (ONETOUCH ULTRASOFT) lancets USE AS INSTRUCTED TO CHECK BLOOD SUGARS THREE TIMES A DAY 200 each 3   magic mouthwash w/lidocaine SOLN Take 5 mLs by mouth 4 (four) times daily, discard after 14 days 320 mL 0   metFORMIN (GLUCOPHAGE) 500 MG tablet Take 2 tablets (1,000 mg total) by mouth 2 (two) times daily with a meal. 360 tablet 3   Multiple Vitamin (MULTIVITAMIN WITH MINERALS) TABS tablet Take 1 tablet by mouth daily. 100 tablet 0   pantoprazole (PROTONIX) 40 MG tablet Take 1 tablet (40 mg total) by mouth 2 (two) times daily before a meal. 60 tablet 1   PARoxetine (PAXIL) 20 MG tablet Take 1 tablet (20 mg total) by mouth daily. 90 tablet 0   predniSONE (DELTASONE) 10 MG tablet Take 1.5 tablets (15 mg total) by mouth daily with breakfast. 90 tablet 0   pyridostigmine (MESTINON) 60 MG tablet Take 60 mg by mouth 3 (three) times daily.     repaglinide (PRANDIN) 0.5 MG tablet Take 1 tablet (0.5 mg total) by mouth daily before supper. 90 tablet 3   sitaGLIPtin (JANUVIA) 100 MG tablet Take 1 tablet (100 mg total) by mouth daily. 90 tablet 3   No current facility-administered medications on file prior to visit.    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)    Spongiotic dermatitis    Type II or unspecified type diabetes mellitus without mention of complication, not stated as uncontrolled    Viral hepatitis B without mention of hepatic coma, chronic, without mention of hepatitis delta    Allergies  Allergen Reactions   Azithromycin Other (See Comments)    REACTION: exac of  his mg   Beta Adrenergic Blockers Other (See Comments)    REACTION: exac of mg   Calcium Channel Blockers Other (See Comments)    Medications that may worsen or trigger MG exacerbation: Class IA antiarrhythmics, magnesium, flouroquinolones, macrolides, aminoglycosides, penicillamine, curare, interferon alpha, botox, quinine. Use with caution: calcium channel blockers, beta blockers and statins.    Macrolides And Ketolides Other (See Comments)    Medications that may worsen or trigger MG exacerbation: Class IA antiarrhythmics, magnesium, flouroquinolones, macrolides, aminoglycosides, penicillamine, curare, interferon alpha, botox, quinine. Use with caution: calcium channel blockers, beta blockers and statins.    Statins Other (See Comments)    Medications that may worsen or trigger MG exacerbation: Class IA antiarrhythmics, magnesium, flouroquinolones, macrolides, aminoglycosides, penicillamine, curare, interferon alpha, botox, quinine. Use with caution: calcium channel blockers, beta blockers and statins.     Social History   Socioeconomic History   Marital status: Married    Spouse name: Not on file  Number of children: Not on file   Years of education: Not on file   Highest education level: Not on file  Occupational History   Not on file  Tobacco Use   Smoking status: Never   Smokeless tobacco: Never  Substance and Sexual Activity   Alcohol use: No   Drug use: No   Sexual activity: Not Currently  Other Topics Concern   Not on file  Social History Narrative   Not on file   Social Determinants of Health   Financial Resource Strain: Not on file  Food Insecurity: No Food Insecurity (07/24/2022)   Hunger Vital Sign    Worried About Running Out of Food in the Last Year: Never true    Ran Out of Food in the Last Year: Never true  Recent Concern: Food Insecurity - Food Insecurity Present (06/16/2022)   Hunger Vital Sign    Worried About Running Out of Food in the Last Year:  Sometimes true    Ran Out of Food in the Last Year: Never true  Transportation Needs: No Transportation Needs (07/19/2022)   PRAPARE - Administrator, Civil Service (Medical): No    Lack of Transportation (Non-Medical): No  Physical Activity: Not on file  Stress: Not on file  Social Connections: Not on file    Vitals:   09/22/22 1238  BP: 120/76  Pulse: 100  Temp: 98.8 F (37.1 C)  SpO2: 99%   Body mass index is 16.87 kg/m.  Physical Exam Vitals and nursing note reviewed. Exam conducted with a chaperone present.  Constitutional:      General: He is not in acute distress.    Appearance: He is well-developed.  HENT:     Head: Normocephalic and atraumatic.     Mouth/Throat:     Mouth: Mucous membranes are moist.  Eyes:     Conjunctiva/sclera: Conjunctivae normal.  Cardiovascular:     Rate and Rhythm: Normal rate and regular rhythm.     Pulses:          Dorsalis pedis pulses are 2+ on the right side and 2+ on the left side.     Heart sounds: No murmur heard. Pulmonary:     Effort: Pulmonary effort is normal. No respiratory distress.     Breath sounds: Normal breath sounds.  Abdominal:     Palpations: Abdomen is soft. There is no hepatomegaly or mass.     Tenderness: There is no abdominal tenderness.  Genitourinary:    Rectum: No mass, tenderness or external hemorrhoid. Normal anal tone.  Musculoskeletal:     Right lower leg: No edema.     Left lower leg: No edema.  Lymphadenopathy:     Cervical: No cervical adenopathy.  Skin:    General: Skin is warm.     Findings: No erythema or rash.  Neurological:     General: No focal deficit present.     Mental Status: He is alert and oriented to person, place, and time.     Gait: Gait normal.     Comments: Right side palpebral ptosis.  Psychiatric:        Mood and Affect: Affect normal. Mood is anxious.   ASSESSMENT AND PLAN:  Mr. Bryian was seen today for medical management of chronic issues.  Diagnoses  and all orders for this visit: Insomnia, unspecified type Assessment & Plan: Sleep has improved. Continue Xanax 0.5 mg at bedtime as needed. Good sleep hygiene also recommended. Keep appointment with psychiatrist.  External hemorrhoids Assessment & Plan: I do not appreciate any on examination today. History suggest possibility of mild rectal prolapse but not visible today with Valsalva. He is not interested in GI consultation. Continue adequate fiber and fluid intake to avoid constipation.   Hypertension, essential, benign Assessment & Plan: BP adequately controlled. For now I recommend continuing diltiazem 90 mg 1/2 tablet 3 times daily, which she is also helping with sinus tachycardia. In the past she had reported a skin pruritus and medication have been changed due to this problem, I am not sure if diltiazem is causing symptoms, recommend arranging appointment with his dermatologist if persistent. Continue monitoring BP at home regularly as well as HR. Follow-up in 6 months, before if needed.   Gastroesophageal reflux disease, unspecified whether esophagitis present Assessment & Plan: Asymptomatic. States that omeprazole and Pantoprazole have same co-pay, so continue with the later one. Asymptomatic, so Pantoprazole 40 mg decreased from twice daily to once daily, 30 minutes before breakfast. Continue GERD precautions. Monitor for recurrence of symptoms.   Generalized anxiety disorder Assessment & Plan: Associated PTSD. He discontinue paroxetine. Strongly recommend keeping appointment with psychiatrist. For now no changes in clonazepam or Xanax dose. Continue BuSpar 5 mg 3 times daily.   Sinus tachycardia Assessment & Plan: Problem has been adequate controlled since started on Diltiazem 90 mg, continue 1/2 tab tid. Continue monitoring HR at home.   Pruritus Assessment & Plan: This has been a chronic problem. No associated rash at this time. Recommend following  with dermatologist of problem gets worse.   I spent a total of 43 minutes in both face to face and non face to face activities for this visit on the date of this encounter. During this time history was obtained and documented, examination was performed, prior labs reviewed, and assessment/plan discussed.  Return in about 6 months (around 03/25/2023) for chronic problems.  Rox Mcgriff G. [place], MD  [facility] [place] office.

## 2022-09-22 ENCOUNTER — Ambulatory Visit: Payer: Medicare Other | Admitting: Family Medicine

## 2022-09-22 ENCOUNTER — Encounter: Payer: Self-pay | Admitting: Family Medicine

## 2022-09-22 VITALS — BP 120/76 | HR 100 | Temp 98.8°F | Ht 66.0 in | Wt 104.5 lb

## 2022-09-22 DIAGNOSIS — R000 Tachycardia, unspecified: Secondary | ICD-10-CM | POA: Diagnosis not present

## 2022-09-22 DIAGNOSIS — K219 Gastro-esophageal reflux disease without esophagitis: Secondary | ICD-10-CM

## 2022-09-22 DIAGNOSIS — L299 Pruritus, unspecified: Secondary | ICD-10-CM

## 2022-09-22 DIAGNOSIS — K644 Residual hemorrhoidal skin tags: Secondary | ICD-10-CM

## 2022-09-22 DIAGNOSIS — G4700 Insomnia, unspecified: Secondary | ICD-10-CM | POA: Diagnosis not present

## 2022-09-22 DIAGNOSIS — I10 Essential (primary) hypertension: Secondary | ICD-10-CM

## 2022-09-22 DIAGNOSIS — F411 Generalized anxiety disorder: Secondary | ICD-10-CM | POA: Diagnosis not present

## 2022-09-22 NOTE — Assessment & Plan Note (Signed)
Asymptomatic. States that omeprazole and Pantoprazole have same co-pay, so continue with the later one. Asymptomatic, so Pantoprazole 40 mg decreased from twice daily to once daily, 30 minutes before breakfast. Continue GERD precautions. Monitor for recurrence of symptoms.

## 2022-09-22 NOTE — Assessment & Plan Note (Signed)
BP adequately controlled. For now I recommend continuing diltiazem 90 mg 1/2 tablet 3 times daily, which she is also helping with sinus tachycardia. In the past she had reported a skin pruritus and medication have been changed due to this problem, I am not sure if diltiazem is causing symptoms, recommend arranging appointment with his dermatologist if persistent. Continue monitoring BP at home regularly as well as HR. Follow-up in 6 months, before if needed.

## 2022-09-22 NOTE — Patient Instructions (Addendum)
A few things to remember from today's visit:  External hemorrhoids  Hypertension, essential, benign  Gastroesophageal reflux disease, unspecified whether esophagitis present  Generalized anxiety disorder  Insomnia, unspecified type Keep appt with psychiatrist. Decrease pantoprazole to 1 tab daily. Rest no changes.  If you need refills for medications you take chronically, please call your pharmacy. Do not use My Chart to request refills or for acute issues that need immediate attention. If you send a my chart message, it may take a few days to be addressed, specially if I am not in the office.  Please be sure medication list is accurate. If a new problem present, please set up appointment sooner than planned today.

## 2022-09-22 NOTE — Assessment & Plan Note (Signed)
They were not appreciated on examination today. History suggest possibility of mild rectal prolapse but not visible today with Valsalva. He is not interested in GI consultation.

## 2022-09-22 NOTE — Assessment & Plan Note (Signed)
He discontinue paroxetine. Strongly recommend keeping appointment with psychiatrist. For now no changes in clonazepam or Xanax dose. Continue BuSpar 5 mg 3 times daily.

## 2022-09-22 NOTE — Assessment & Plan Note (Signed)
Sleep has improved. Continue Xanax 0.5 mg at bedtime as needed. Good sleep hygiene also recommended. Keep appointment with psychiatrist.

## 2022-09-23 NOTE — Assessment & Plan Note (Signed)
Problem has been adequate controlled since started on Diltiazem 90 mg, continue 1/2 tab tid. Continue monitoring HR at home.

## 2022-09-23 NOTE — Assessment & Plan Note (Signed)
This has been a chronic problem. No associated rash at this time. Recommend following with dermatologist of problem gets worse.

## 2022-09-28 ENCOUNTER — Telehealth: Payer: Self-pay | Admitting: Family Medicine

## 2022-09-28 NOTE — Telephone Encounter (Signed)
[name] with triage nurse called and stated pt was not wanting to be triaged. He was sent over due to stating he had a dropping eye, however she said he did not want to answer any of her questions. He said he wanted a message sent back to Dr. Elvis Coil team letting them know he is experiencing dropping eye and the "same symptoms he went to the hospital for." Pt did not want to schedule an appointment.

## 2022-09-29 NOTE — Telephone Encounter (Signed)
Called pt to see what symptoms he was having, no answer, left VM to call office back.

## 2022-10-05 ENCOUNTER — Ambulatory Visit: Payer: Self-pay

## 2022-10-05 NOTE — Patient Instructions (Signed)
Visit Information  Thank you for taking time to visit with me today. Please don't hesitate to contact me if I can be of assistance to you.   Following are the goals we discussed today:   Goals Addressed               This Visit's Progress     Get better to live normal (pt-stated)        Patient Goals/Self Care Activities: -Patient/Caregiver will self-administer medications as prescribed as evidenced by self-report/primary caregiver report  -Patient/Caregiver will attend all scheduled provider appointments as evidenced by clinician review of documented attendance to scheduled appointments and patient/caregiver report -Patient/Caregiver will call provider office for new concerns or questions as evidenced by review of documented incoming telephone call notes and patient report  -check blood sugar at prescribed times -check blood sugar if I feel it is too high or too low -record values and write them down take them to all doctor visits    Patient states doing good.  He still has some issues swallowing due to Mysthenia Gravis but he eats mainly soft foods.  Discussed swallow precautions and being mindful of his swallowing ability daily.  He verbalized understanding.  Patient has psychiatry  appointment 10/17/22 due to PTSD. Encouraged to keep the appointment  Blood sugars running between 96-126.  Reiterated diabetes management.           Our next appointment is by telephone on 11/02/22 at 1100 am  Please call the care guide team at [phone] if you need to cancel or reschedule your appointment.   If you are experiencing a Mental Health or Behavioral Health Crisis or need someone to talk to, please call the Suicide and Crisis Lifeline: 988   The patient verbalized understanding of instructions, educational materials, and care plan provided today and DECLINED offer to receive copy of patient instructions, educational materials, and care plan.   The patient has been provided with  contact information for the care management team and has been advised to call with any health related questions or concerns.   [name], RN, MSN [facility], [facility] Management Community Coordinator Direct Dial: [phone]  Fax: [phone] Website: [email or web address]

## 2022-10-05 NOTE — Patient Outreach (Signed)
  Care Coordination   Follow Up Visit Note   10/05/2022 Name: [name] MRN: [id number] DOB: [birth year]  [name] is a 67 y.o. year old male who sees [name], Matthew Expose, MD for primary care. I spoke with  [name] by phone today.  What matters to the patients health and wellness today?  Get better     Goals Addressed               This Visit's Progress     Get better to live normal (pt-stated)        Patient Goals/Self Care Activities: -Patient/Caregiver will self-administer medications as prescribed as evidenced by self-report/primary caregiver report  -Patient/Caregiver will attend all scheduled provider appointments as evidenced by clinician review of documented attendance to scheduled appointments and patient/caregiver report -Patient/Caregiver will call provider office for new concerns or questions as evidenced by review of documented incoming telephone call notes and patient report  -check blood sugar at prescribed times -check blood sugar if I feel it is too high or too low -record values and write them down take them to all doctor visits    Patient states doing good.  He still has some issues swallowing due to Mysthenia Gravis but he eats mainly soft foods.  Discussed swallow precautions and being mindful of his swallowing ability daily.  He verbalized understanding.  Patient has psychiatry  appointment 10/17/22 due to PTSD. Encouraged to keep the appointment  Blood sugars running between 96-126.  Reiterated diabetes management.           SDOH assessments and interventions completed:  Yes     Care Coordination Interventions:  Yes, provided   Follow up plan: Follow up call scheduled for October    Encounter Outcome:  Patient Visit Completed   Matthew Leriche, RN, MSN [place]  [facility], [facility] Management Community Coordinator Direct Dial: [phone]  Fax: [phone] Website: [email or web address]

## 2022-10-10 ENCOUNTER — Other Ambulatory Visit: Payer: Self-pay | Admitting: Family Medicine
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
## 2022-10-17 ENCOUNTER — Other Ambulatory Visit: Payer: Self-pay

## 2022-10-17 ENCOUNTER — Ambulatory Visit (HOSPITAL_BASED_OUTPATIENT_CLINIC_OR_DEPARTMENT_OTHER): Payer: Medicare Other | Admitting: Psychiatry

## 2022-10-17 ENCOUNTER — Encounter (HOSPITAL_COMMUNITY): Payer: Self-pay | Admitting: Psychiatry

## 2022-10-17 VITALS — BP 147/83 | HR 98 | Ht 65.0 in | Wt 109.0 lb

## 2022-10-17 DIAGNOSIS — F4310 Post-traumatic stress disorder, unspecified: Secondary | ICD-10-CM | POA: Diagnosis not present

## 2022-10-24 ENCOUNTER — Ambulatory Visit (INDEPENDENT_AMBULATORY_CARE_PROVIDER_SITE_OTHER): Payer: Medicare Other | Admitting: Licensed Clinical Social Worker

## 2022-10-24 DIAGNOSIS — F4323 Adjustment disorder with mixed anxiety and depressed mood: Secondary | ICD-10-CM
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
## 2022-11-02 ENCOUNTER — Ambulatory Visit: Payer: Self-pay

## 2022-11-02 NOTE — Patient Outreach (Signed)
  Care Coordination   11/02/2022 Name: [name] MRN: [id number] DOB: 1955-04-08   Care Coordination Outreach Attempts:  An unsuccessful telephone outreach was attempted today to offer the patient information about available care coordination services.  Follow Up Plan:  Additional outreach attempts will be made to offer the patient care coordination information and services.   Encounter Outcome:  No Answer   Care Coordination Interventions:  No, not indicated    [name], RN, MSN [facility]  [facility], [facility] Management Community Coordinator Direct Dial: [phone]  Fax: [phone] Website: [email or web address]

## 2022-11-06 DIAGNOSIS — L219 Seborrheic dermatitis, unspecified: Secondary | ICD-10-CM | POA: Diagnosis not present

## 2022-11-07 ENCOUNTER — Other Ambulatory Visit: Payer: Self-pay | Admitting: Family Medicine

## 2022-11-07 ENCOUNTER — Encounter: Payer: Self-pay | Admitting: Family Medicine

## 2022-11-07 ENCOUNTER — Ambulatory Visit (INDEPENDENT_AMBULATORY_CARE_PROVIDER_SITE_OTHER): Payer: Medicare Other | Admitting: Family Medicine

## 2022-11-07 ENCOUNTER — Telehealth: Payer: Self-pay

## 2022-11-07 VITALS — BP 138/70 | HR 100 | Temp 98.0°F | Resp 16 | Ht 65.0 in | Wt 105.0 lb

## 2022-11-07 DIAGNOSIS — Z23 Encounter for immunization: Secondary | ICD-10-CM | POA: Diagnosis not present

## 2022-11-07 DIAGNOSIS — I10 Essential (primary) hypertension: Secondary | ICD-10-CM

## 2022-11-07 DIAGNOSIS — K219 Gastro-esophageal reflux disease without esophagitis: Secondary | ICD-10-CM | POA: Diagnosis not present

## 2022-11-07 DIAGNOSIS — R630 Anorexia: Secondary | ICD-10-CM | POA: Diagnosis not present

## 2022-11-07 DIAGNOSIS — K047 Periapical abscess without sinus: Secondary | ICD-10-CM

## 2022-11-07 MED ORDER — PANTOPRAZOLE SODIUM 40 MG PO TBEC
40.0000 mg | DELAYED_RELEASE_TABLET | Freq: Every day | ORAL | Status: DC
Start: 2022-11-07 — End: 2022-12-08

## 2022-11-07 NOTE — Progress Notes (Signed)
ACUTE VISIT Chief Complaint  Patient presents with   no appetite   weak in the mornings   HPI: [name] is a 67 y.o. male with a PMHx significant for HTN, DM II, sinus tachycardia, myasthenia gravis, anxiety/depression, PTSD, chronic hepatitis B infection, and HLD, who is here today complaining of loss of appetite and weakness.   Last seen on 09/22/2022.  HPI  Patient complains of intermittent appetite loss for the last several months. He was seen at the dentist for pain on his last lower right molar. He says the dentist smoothed the tooth, but he still has some jaw pain.  He also says his stomach often makes noise when he sleeps.  He denies any heartburn, acid reflux, difficulty swallowing, change in his bowel habits, blood in his stool, or black stool.  He reports some associated recent weight loss. He has been trying to drink protein shakes.   Myasthenia gravis:  He is currently taking prednisone 10 mg daily.  He is also taking mestinon 60 mg 3x per day, which he said helps his eye droop and weakness.  Hypertension:  He has been checking his BP at home. He says his readings have been 130s/80s He is taking dilitiazem 45 mg every 8 hours.   He has established with a psychiatrist and a psychotherapist.  Review of Systems See other pertinent positives and negatives in HPI.  Current Outpatient Medications on File Prior to Visit  Medication Sig Dispense Refill   ALPRAZolam (XANAX) 0.5 MG tablet Take 1 tablet (0.5 mg total) by mouth at bedtime as needed for anxiety. 30 tablet 1   busPIRone (BUSPAR) 5 MG tablet Take 1 tablet (5 mg total) by mouth 3 (three) times daily. 270 tablet 1   Cholecalciferol (VITAMIN D) 2000 UNITS tablet Take 2,000 Units by mouth daily.     clonazePAM (KLONOPIN) 0.5 MG tablet Take 1 tablet (0.5 mg total) by mouth 2 (two) times daily. 60 tablet 1   desonide (DESONATE) 0.05 % gel Apply 1 application topically 2 (two) times daily as needed.       diltiazem (CARDIZEM) 90 MG tablet Take 0.5 tablets (45 mg total) by mouth every 8 (eight) hours. 135 tablet 0   entecavir (BARACLUDE) 1 MG tablet Take 1 tablet (1 mg total) by mouth daily. 30 tablet 6   glucose blood (ONETOUCH ULTRA) test strip USE TO TEST BLOOD SUGAR 3 TIMES DAILY 100 strip 11   Lancets (ONETOUCH ULTRASOFT) lancets USE AS INSTRUCTED TO CHECK BLOOD SUGARS THREE TIMES A DAY 200 each 3   magic mouthwash w/lidocaine SOLN Take 5 mLs by mouth 4 (four) times daily, discard after 14 days 320 mL 0   metFORMIN (GLUCOPHAGE) 500 MG tablet Take 2 tablets (1,000 mg total) by mouth 2 (two) times daily with a meal. 360 tablet 3   Multiple Vitamin (MULTIVITAMIN WITH MINERALS) TABS tablet Take 1 tablet by mouth daily. 100 tablet 0   pantoprazole (PROTONIX) 40 MG tablet TAKE 1 TABLET BY MOUTH 2 TIMES A DAY BEFORE A MEAL 60 tablet 1   predniSONE (DELTASONE) 10 MG tablet Take 1.5 tablets (15 mg total) by mouth daily with breakfast. 90 tablet 0   pyridostigmine (MESTINON) 60 MG tablet Take 60 mg by mouth 3 (three) times daily.     repaglinide (PRANDIN) 0.5 MG tablet Take 1 tablet (0.5 mg total) by mouth daily before supper. 90 tablet 3   sitaGLIPtin (JANUVIA) 100 MG tablet Take 1 tablet (100 mg total) by  mouth daily. 90 tablet 3   No current facility-administered medications on file prior to visit.    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)    Spongiotic dermatitis    Type II or unspecified type diabetes mellitus without mention of complication, not stated as uncontrolled    Viral hepatitis B without mention of hepatic coma, chronic, without mention of hepatitis  delta    Allergies  Allergen Reactions   Azithromycin Other (See Comments)    REACTION: exac of his mg   Beta Adrenergic Blockers Other (See Comments)    REACTION: exac of mg   Calcium Channel Blockers Other (See Comments)    Medications that may worsen or trigger MG exacerbation: Class IA antiarrhythmics, magnesium, flouroquinolones, macrolides, aminoglycosides, penicillamine, curare, interferon alpha, botox, quinine. Use with caution: calcium channel blockers, beta blockers and statins.    Macrolides And Ketolides Other (See Comments)    Medications that may worsen or trigger MG exacerbation: Class IA antiarrhythmics, magnesium, flouroquinolones, macrolides, aminoglycosides, penicillamine, curare, interferon alpha, botox, quinine. Use with caution: calcium channel blockers, beta blockers and statins.    Statins Other (See Comments)    Medications that may worsen or trigger MG exacerbation: Class IA antiarrhythmics, magnesium, flouroquinolones, macrolides, aminoglycosides, penicillamine, curare, interferon alpha, botox, quinine. Use with caution: calcium channel blockers, beta blockers and statins.     Social History   Socioeconomic History   Marital status: Married    Spouse name: Not on file   Number of children: Not on file   Years of education: Not on file   Highest education level: Not on file  Occupational History   Not on file  Tobacco Use   Smoking status: Never   Smokeless tobacco: Never  Substance and Sexual Activity   Alcohol use: No   Drug use: No   Sexual activity: Not Currently  Other Topics Concern   Not on file  Social History Narrative   Not on file   Social Determinants of Health   Financial Resource Strain: Not on file  Food Insecurity: No Food Insecurity (07/24/2022)   Hunger Vital Sign    Worried About Running Out of Food in the Last Year: Never true    Ran Out of Food in the Last Year: Never true  Recent Concern: Food Insecurity - Food Insecurity  Present (06/16/2022)   Hunger Vital Sign    Worried About Running Out of Food in the Last Year: Sometimes true    Ran Out of Food in the Last Year: Never true  Transportation Needs: No Transportation Needs (07/19/2022)   PRAPARE - Administrator, Civil Service (Medical): No    Lack of Transportation (Non-Medical): No  Physical Activity: Not on file  Stress: Not on file  Social Connections: Not on file    Vitals:   11/07/22 1450  BP: 138/70  Pulse: 100  Temp: 98 F (36.7 C)  SpO2: 99%   Body mass index is 17.47 kg/m.  Physical Exam Vitals and nursing note reviewed.  Constitutional:      General: He is not in acute distress.    Appearance: He  is well-developed.  HENT:     Head: Normocephalic and atraumatic.     Mouth/Throat:     Mouth: Mucous membranes are moist.     Comments: *** Eyes:     Conjunctiva/sclera: Conjunctivae normal.  Cardiovascular:     Rate and Rhythm: Normal rate and regular rhythm.     Pulses:          Dorsalis pedis pulses are 2+ on the right side and 2+ on the left side.     Heart sounds: No murmur heard. Pulmonary:     Effort: Pulmonary effort is normal. No respiratory distress.     Breath sounds: Normal breath sounds.  Abdominal:     Palpations: Abdomen is soft. There is no hepatomegaly or mass.     Tenderness: There is no abdominal tenderness.  Lymphadenopathy:     Cervical: No cervical adenopathy.  Skin:    General: Skin is warm.     Findings: No erythema or rash.  Neurological:     Mental Status: He is alert and oriented to person, place, and time.     Cranial Nerves: No cranial nerve deficit.     Gait: Gait normal.  Psychiatric:     Comments: Well groomed, good eye contact.     ASSESSMENT AND PLAN:  Mr. Wilhelmsen was seen today for loss of appetite and weakness.   There are no diagnoses linked to this encounter.  No follow-ups on file.  I, Suanne Marker, acting as a scribe for Burhanuddin Kohlmann [place], MD., have documented all  relevant documentation on the behalf of Angie Piercey [place], MD, as directed by  Jaid Quirion [place], MD while in the presence of Kaylana Fenstermacher [place], MD.   I, Suanne Marker, have reviewed all documentation for this visit. The documentation on 11/07/22 for the exam, diagnosis, procedures, and orders are all accurate and complete.  Jennife Zaucha G. [place], MD  [facility]. [place] office.  Discharge Instructions   None

## 2022-11-07 NOTE — Patient Instructions (Signed)
Visit Information  Thank you for taking time to visit with me today. Please don't hesitate to contact me if I can be of assistance to you.   Following are the goals we discussed today:   Goals Addressed               This Visit's Progress     Get better to live normal (pt-stated)        Patient Goals/Self Care Activities: -Patient/Caregiver will self-administer medications as prescribed as evidenced by self-report/primary caregiver report  -Patient/Caregiver will attend all scheduled provider appointments as evidenced by clinician review of documented attendance to scheduled appointments and patient/caregiver report -Patient/Caregiver will call provider office for new concerns or questions as evidenced by review of documented incoming telephone call notes and patient report  -check blood sugar at prescribed times -check blood sugar if I feel it is too high or too low -record values and write them down take them to all doctor visits    Patient states doing good.  He still has some issues swallowing due to Mysthenia Gravis but he eats mainly soft foods.  Reviewed swallow precautions and being mindful of his swallowing ability daily.  He verbalized understanding.  Patient to see PCP this afternoon.  He reports some problems with his stomach such as excessive noise and inability to eat.    Blood sugar this AM 96.  Reviewed diabetes management.           Our next appointment is by telephone on 11/17/22 at 1130 am  Please call the care guide team at [phone] if you need to cancel or reschedule your appointment.   If you are experiencing a Mental Health or Behavioral Health Crisis or need someone to talk to, please call the Suicide and Crisis Lifeline: 988   The patient verbalized understanding of instructions, educational materials, and care plan provided today and DECLINED offer to receive copy of patient instructions, educational materials, and care plan.   The patient has been  provided with contact information for the care management team and has been advised to call with any health related questions or concerns.   [name], RN, MSN [facility], [facility] Management Community Coordinator Direct Dial: [phone]  Fax: [phone] Website: [email or web address]

## 2022-11-07 NOTE — Patient Instructions (Addendum)
A few things to remember from today's visit:  Decreased appetite  Dental abscess  Hypertension, essential, benign  Gastroesophageal reflux disease, unspecified whether esophagitis present  Need for influenza vaccination - Plan: Flu Vaccine Trivalent High Dose (Fluad)  Small meals at the time. Protein shakes 1-2 daily, Ensure is one example. No changes today. I do not see any contraindication to have your tooth removed. Continue monitoring blood pressure, goal 130/80 or less.  Wt Readings from Last 3 Encounters:  11/07/22 105 lb (47.6 kg)  09/22/22 104 lb 8 oz (47.4 kg)  08/18/22 105 lb (47.6 kg)    If you need refills for medications you take chronically, please call your pharmacy. Do not use My Chart to request refills or for acute issues that need immediate attention. If you send a my chart message, it may take a few days to be addressed, specially if I am not in the office.  Please be sure medication list is accurate. If a new problem present, please set up appointment sooner than planned today.

## 2022-11-07 NOTE — Patient Outreach (Signed)
  Care Coordination   Follow Up Visit Note   11/07/2022 Name: Jabez Molner MRN: [id number] DOB: 15-Mar-1955  Regino Zimmers is a 67 y.o. year old male who sees [place], Timoteo Expose, MD for primary care. I spoke with  [name] by phone today.  What matters to the patients health and wellness today?  Stomach issues    Goals Addressed               This Visit's Progress     Get better to live normal (pt-stated)        Patient Goals/Self Care Activities: -Patient/Caregiver will self-administer medications as prescribed as evidenced by self-report/primary caregiver report  -Patient/Caregiver will attend all scheduled provider appointments as evidenced by clinician review of documented attendance to scheduled appointments and patient/caregiver report -Patient/Caregiver will call provider office for new concerns or questions as evidenced by review of documented incoming telephone call notes and patient report  -check blood sugar at prescribed times -check blood sugar if I feel it is too high or too low -record values and write them down take them to all doctor visits    Patient states doing good.  He still has some issues swallowing due to Mysthenia Gravis but he eats mainly soft foods.  Reviewed swallow precautions and being mindful of his swallowing ability daily.  He verbalized understanding.  Patient to see PCP this afternoon.  He reports some problems with his stomach such as excessive noise and inability to eat.    Blood sugar this AM 96.  Reviewed diabetes management.           SDOH assessments and interventions completed:  Yes     Care Coordination Interventions:  Yes, provided   Follow up plan: Follow up call scheduled for October    Encounter Outcome:  Patient Visit Completed   [name], RN, MSN [place]  [facility], [facility] Management Community Coordinator Direct Dial: [phone]  Fax: [phone] Website:  [email or web address]

## 2022-11-09 ENCOUNTER — Other Ambulatory Visit: Payer: Self-pay | Admitting: Family Medicine

## 2022-11-15 ENCOUNTER — Other Ambulatory Visit: Payer: Self-pay

## 2022-11-15 ENCOUNTER — Other Ambulatory Visit: Payer: Self-pay | Admitting: Family Medicine

## 2022-11-17 ENCOUNTER — Ambulatory Visit: Payer: Self-pay

## 2022-11-17 NOTE — Patient Outreach (Signed)
  Care Coordination   11/17/2022 Name: [name] MRN: [id number] DOB: 1955/11/23   Care Coordination Outreach Attempts:  An unsuccessful telephone outreach was attempted today to offer the patient information about available care coordination services.  Follow Up Plan:  Additional outreach attempts will be made to offer the patient care coordination information and services.   Encounter Outcome:  No Answer   Care Coordination Interventions:  No, not indicated    [name], RN, MSN [facility]  [facility], [facility] Management Community Coordinator Direct Dial: [phone]  Fax: [phone] Website: [email or web address]

## 2022-11-24 ENCOUNTER — Telehealth: Payer: Self-pay | Admitting: Internal Medicine

## 2022-11-24 DIAGNOSIS — E119 Type 2 diabetes mellitus without complications: Secondary | ICD-10-CM

## 2022-11-24 MED ORDER — ONETOUCH ULTRA VI STRP
ORAL_STRIP | 11 refills | Status: AC
Start: 2022-11-24 — End: ?

## 2022-11-24 MED ORDER — DILTIAZEM HCL 90 MG PO TABS: ORAL_TABLET | ORAL | 2 refills | Status: DC

## 2022-11-24 MED ORDER — ONETOUCH ULTRASOFT LANCETS MISC: 3 refills | Status: DC

## 2022-11-24 NOTE — Addendum Note (Signed)
Addended by: Kathreen Devoid on: 11/24/2022 09:54 AM   Modules accepted: Orders

## 2022-11-24 NOTE — Telephone Encounter (Signed)
Pt is out of med

## 2022-11-24 NOTE — Telephone Encounter (Signed)
MEDICATION:  1)  OneTouch Ultra glucose blood (ONETOUCH ULTRA) test strip  2)  onetouch ultrasoft Lancets (ONETOUCH ULTRASOFT) lancets  PHARMACY:    [facility] [id number] [place], [place] - [address] (Ph: [phone])    HAS THE PATIENT CONTACTED THEIR PHARMACY?  Yes  IS THIS A 90 DAY SUPPLY : Yes  IS PATIENT OUT OF MEDICATION: Yes  IF NOT; HOW MUCH IS LEFT:   LAST APPOINTMENT DATE: @7 /10/2022  NEXT APPOINTMENT DATE:@1 /13/2025  DO WE HAVE YOUR PERMISSION TO LEAVE A DETAILED MESSAGE?: Yes  OTHER COMMENTS:    **Let patient know to contact pharmacy at the end of the day to make sure medication is ready. **  ** Please notify patient to allow 48-72 hours to process**  **Encourage patient to contact the pharmacy for refills or they can request refills through [facility]**

## 2022-11-27 ENCOUNTER — Telehealth: Payer: Self-pay

## 2022-11-27 NOTE — Patient Instructions (Signed)
Visit Information  Thank you for taking time to visit with me today. Please don't hesitate to contact me if I can be of assistance to you.   Following are the goals we discussed today:   Goals Addressed               This Visit's Progress     COMPLETED: Get better to live normal (pt-stated)        Patient Goals/Self Care Activities: -Patient/Caregiver will self-administer medications as prescribed as evidenced by self-report/primary caregiver report  -Patient/Caregiver will attend all scheduled provider appointments as evidenced by clinician review of documented attendance to scheduled appointments and patient/caregiver report -Patient/Caregiver will call provider office for new concerns or questions as evidenced by review of documented incoming telephone call notes and patient report  -check blood sugar at prescribed times -check blood sugar if I feel it is too high or too low -record values and write them down take them to all doctor visits    Patient states doing good.  He swallowing is better.  He is eating soft foods and regular foods now.   He reports blood sugars 85/110.  Reviewed diabetes management.  He declines further follow up from CM at this time.  No concerns.            If you are experiencing a Mental Health or Behavioral Health Crisis or need someone to talk to, please call the Suicide and Crisis Lifeline: 988   The patient verbalized understanding of instructions, educational materials, and care plan provided today and DECLINED offer to receive copy of patient instructions, educational materials, and care plan.   The patient has been provided with contact information for the care management team and has been advised to call with any health related questions or concerns.   [name], RN, MSN [facility], [facility] Management Community Coordinator Direct Dial: [phone]  Fax: [phone] Website:  [email or web address]

## 2022-11-27 NOTE — Patient Outreach (Signed)
  Care Coordination   Follow Up Visit Note   11/27/2022 Name: [name] MRN: [id number] DOB: 1955-04-20  [name] is a 67 y.o. year old male who sees [place], Timoteo Expose, MD for primary care. I spoke with  [name] by phone today.  What matters to the patients health and wellness today?  Maintain health    Goals Addressed               This Visit's Progress     COMPLETED: Get better to live normal (pt-stated)        Patient Goals/Self Care Activities: -Patient/Caregiver will self-administer medications as prescribed as evidenced by self-report/primary caregiver report  -Patient/Caregiver will attend all scheduled provider appointments as evidenced by clinician review of documented attendance to scheduled appointments and patient/caregiver report -Patient/Caregiver will call provider office for new concerns or questions as evidenced by review of documented incoming telephone call notes and patient report  -check blood sugar at prescribed times -check blood sugar if I feel it is too high or too low -record values and write them down take them to all doctor visits    Patient states doing good.  He swallowing is better.  He is eating soft foods and regular foods now.   He reports blood sugars 85/110.  Reviewed diabetes management.  He declines further follow up from CM at this time.  No concerns.          SDOH assessments and interventions completed:  Yes     Care Coordination Interventions:  Yes, provided   Follow up plan: No further intervention required.   Encounter Outcome:  Patient Visit Completed   [name], RN, MSN [facility]  [facility], [facility] Management Community Coordinator Direct Dial: [phone]  Fax: [phone] Website: [email or web address]

## 2022-11-30 ENCOUNTER — Ambulatory Visit (HOSPITAL_COMMUNITY): Payer: Medicare Other | Admitting: Psychiatry

## 2022-12-04 DIAGNOSIS — Z79899 Other long term (current) drug therapy: Secondary | ICD-10-CM | POA: Diagnosis not present

## 2022-12-04 DIAGNOSIS — G7000 Myasthenia gravis without (acute) exacerbation: Secondary | ICD-10-CM | POA: Diagnosis not present

## 2022-12-04 DIAGNOSIS — F4310 Post-traumatic stress disorder, unspecified: Secondary | ICD-10-CM | POA: Diagnosis not present

## 2022-12-04 DIAGNOSIS — E119 Type 2 diabetes mellitus without complications: Secondary | ICD-10-CM | POA: Diagnosis not present

## 2022-12-04 DIAGNOSIS — R000 Tachycardia, unspecified: Secondary | ICD-10-CM | POA: Diagnosis not present
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
## 2022-12-05 ENCOUNTER — Ambulatory Visit (HOSPITAL_BASED_OUTPATIENT_CLINIC_OR_DEPARTMENT_OTHER): Payer: Medicare Other | Admitting: Psychiatry

## 2022-12-05 ENCOUNTER — Other Ambulatory Visit: Payer: Self-pay

## 2022-12-05 ENCOUNTER — Encounter (HOSPITAL_COMMUNITY): Payer: Self-pay | Admitting: Psychiatry

## 2022-12-05 VITALS — BP 132/88 | HR 106 | Ht 65.0 in | Wt 102.0 lb

## 2022-12-05 DIAGNOSIS — F4323 Adjustment disorder with mixed anxiety and depressed mood: Secondary | ICD-10-CM | POA: Diagnosis not present

## 2022-12-05 DIAGNOSIS — F4310 Post-traumatic stress disorder, unspecified: Secondary | ICD-10-CM | POA: Diagnosis not present

## 2022-12-05 MED ORDER — BUSPIRONE HCL 5 MG PO TABS
ORAL_TABLET | ORAL | 1 refills | Status: AC
Start: 2022-12-05 — End: 2023-01-04

## 2022-12-06 DIAGNOSIS — E119 Type 2 diabetes mellitus without complications: Secondary | ICD-10-CM | POA: Diagnosis not present

## 2022-12-07 ENCOUNTER — Other Ambulatory Visit: Payer: Self-pay | Admitting: Family Medicine

## 2022-12-07 DIAGNOSIS — K219 Gastro-esophageal reflux disease without esophagitis: Secondary | ICD-10-CM

## 2022-12-12 LAB — HM DIABETES EYE EXAM

## 2022-12-13 ENCOUNTER — Encounter: Payer: Self-pay | Admitting: Internal Medicine

## 2022-12-16 ENCOUNTER — Other Ambulatory Visit: Payer: Self-pay | Admitting: Internal Medicine

## 2022-12-16 DIAGNOSIS — I10 Essential (primary) hypertension: Secondary | ICD-10-CM

## 2022-12-22 ENCOUNTER — Other Ambulatory Visit: Payer: Self-pay | Admitting: Internal Medicine

## 2022-12-22 DIAGNOSIS — I10 Essential (primary) hypertension: Secondary | ICD-10-CM

## 2022-12-29 ENCOUNTER — Encounter (HOSPITAL_COMMUNITY): Payer: Self-pay | Admitting: *Deleted

## 2022-12-29 ENCOUNTER — Ambulatory Visit (HOSPITAL_COMMUNITY)
Admission: EM | Admit: 2022-12-29 | Discharge: 2022-12-29 | Disposition: A | Discharge status: Home / Self Care | Payer: Medicare Other

## 2022-12-29 NOTE — ED Notes (Signed)
Patient is being discharged from the Urgent Care and sent to the Emergency Department via private vehicle with spouse . Per L. Lequita Halt, [place], patient is in need of higher level of care due to extremity weakness, parasthesia, hx myasthenia gravis. Patient is aware and verbalizes understanding of plan of care.  Vitals:   12/29/22 1131  BP: 138/81  Pulse: 94  Resp: 18  Temp: 98.4 F (36.9 C)  SpO2: 100%

## 2022-12-29 NOTE — ED Triage Notes (Signed)
Pt c/o RUE weakness onset 5 days ago, states noticeable to him when he tries to put on belt; also c/o "tightness" to upper lip, and describes that he dribbles water when he tries to drink and I feel like "I talk weird"; c/o transient sensation of "trouble breathing when I eat". Denies any weakness in legs. Ambulates without difficulty. Denies any HA or vision changes.

## 2023-01-01 ENCOUNTER — Other Ambulatory Visit: Payer: Self-pay | Admitting: Internal Medicine

## 2023-01-01 ENCOUNTER — Encounter: Payer: Self-pay | Admitting: Family Medicine

## 2023-01-01 ENCOUNTER — Ambulatory Visit (INDEPENDENT_AMBULATORY_CARE_PROVIDER_SITE_OTHER): Payer: Medicare Other | Admitting: Family Medicine

## 2023-01-01 VITALS — BP 118/70 | HR 100 | Temp 98.7°F | Resp 16 | Ht 65.0 in | Wt 101.4 lb

## 2023-01-01 DIAGNOSIS — G7000 Myasthenia gravis without (acute) exacerbation: Secondary | ICD-10-CM | POA: Diagnosis not present

## 2023-01-01 DIAGNOSIS — D649 Anemia, unspecified: Secondary | ICD-10-CM

## 2023-01-01 DIAGNOSIS — I10 Essential (primary) hypertension: Secondary | ICD-10-CM | POA: Diagnosis not present

## 2023-01-01 DIAGNOSIS — R636 Underweight: Secondary | ICD-10-CM | POA: Diagnosis not present

## 2023-01-01 DIAGNOSIS — Z681 Body mass index (BMI) 19 or less, adult: Secondary | ICD-10-CM | POA: Diagnosis not present

## 2023-01-01 NOTE — Assessment & Plan Note (Signed)
BP adequately controlled, he is reporting BP's 120-130/70-80 at home. Continue diltiazem 90 mg 1/2 tablet 3 times daily as well as low salt diet.

## 2023-01-01 NOTE — Assessment & Plan Note (Signed)
Symptoms he is reporting today he has had for a while and similar to those he had when he was first Dx'ed with this problem and has had it intermittent since then. Reports having similar symptoms before and after hospitalization in 07/2022.  ? Bulbar MG presentation (dysarthria, chewing fatigability),he denies dysphagia. Symptoms seem to improved after taking his Prednisone and Pyridostigmine but reoccur later during the day. Recommend discussing these symptoms with his neurologist and to discussed other treatment options. Clearly instructed about warning signs.

## 2023-01-01 NOTE — Patient Instructions (Addendum)
A few things to remember from today's visit:  Hypertension, essential, benign  Myasthenia gravis (HCC)  Underweight (BMI < 18.5)  No changes today. Monitor for acute changes. Please arrange appt with neurologist.  If you need refills for medications you take chronically, please call your pharmacy. Do not use My Chart to request refills or for acute issues that need immediate attention. If you send a my chart message, it may take a few days to be addressed, specially if I am not in the office.  Please be sure medication list is accurate. If a new problem present, please set up appointment sooner than planned today.

## 2023-01-01 NOTE — Progress Notes (Signed)
ACUTE VISIT Chief Complaint  Patient presents with   lip tightness   HPI: [name] is a 67 y.o. male with a PMHx significant for HTN, DM II, sinus tachycardia, myasthenia gravis, anxiety/depression, PTSD, chronic hepatitis B infection, and HLD, who is here today complaining of  lip tightness and RUE intermittently weakness  He states that he called c/o lip tightness and some difficulty breathing on 12/29/22, he was instructed to go the the ED, states that he did not go. States that every time he goes to the ER for these symptoms he ends up having an extensive work up and "nothing" is found. He reports having intermittent RUE weakness, lip tightness sensation, and head heaviness since 05/2022 before hospitalization. Brain MRI on 06/27/22: No acute intracranial process. No evidence of acute or subacute infarct. Lab Results  Component Value Date   NA 136 07/21/2022   CL 103 07/21/2022   K 4.3 07/21/2022   CO2 25 07/21/2022   BUN 11 07/21/2022   CREATININE 1.21 07/21/2022   GFR 62.62 07/21/2022   CALCIUM 8.8 07/21/2022   PHOS 3.4 06/26/2022   ALBUMIN 3.7 07/21/2022   GLUCOSE 239 (H) 07/21/2022   Lab Results  Component Value Date   ALT 12 07/21/2022   AST 15 07/21/2022   ALKPHOS 43 07/21/2022   BILITOT 0.3 07/21/2022   He says his lips are feeling like they are pulled together, which is worse when he is talking and chewing. He endorses some difficulty chewing food, gets tired, but denies swallowing difficulty. He also endorses some associated difficulty breathing, particularly with inhalation but denies having SOB with exertion, wheezing, or stridor.   He further complains of weakness in his right arm in the morning and at night. For instance, he needs to support his right arm with his left while brushing his teeth,combing, or washing his face. He says this weakness improves for a few hours when he takes his prednisone 10 mg daily and pyridostigmine 60 mg. He is worried he  cannot increase his prednisone dose because it may raise his blood sugar.  DM II, follows with endocrinologist and currently on  metformin 1000 mg bid, Prandin 0.5 mg daily, and Januvia 100 mg daily.  He denies neck pain, numbness, or tingling.   Right upper eyelid ptosis has improved some. His most recent appointment with neurology was on 11/4.   He is also concerned about wt loss. States that he is drinking daily Ensure because he has difficulty chewing. Negative for fever, chills, or changes in appetite.  -Anemia during last hospitalization. He has not noted blood in stool or melena. Lab Results  Component Value Date   WBC 7.4 07/21/2022   HGB 8.7 Repeated and verified X2. (L) 07/21/2022   HCT 27.0 (L) 07/21/2022   MCV 84.7 07/21/2022   PLT 364.0 07/21/2022   Lab Results  Component Value Date   VITAMINB12 488 07/06/2022   12/04/22 TSH 0.31 and free T4 normal at 0.85.  Hypertension:  Medications: Currently on diltiazem 45 mg every 8 hours.  He has been checking his BP regularly at home, and says his readings have normally been under 140.   Anxiety/depression:  He says he is feeling better in general.  Currently on Buspar 5 mg bid. He is following with psychiatry.   Review of Systems  Constitutional:  Positive for fatigue. Negative for activity change, chills and diaphoresis.  HENT:  Negative for mouth sores, nosebleeds and sore throat.   Respiratory:  Negative  for cough and chest tightness.   Gastrointestinal:  Negative for abdominal pain, nausea and vomiting.  Endocrine: Negative for cold intolerance and heat intolerance.  Genitourinary:  Negative for decreased urine volume, dysuria and hematuria.  Musculoskeletal:  Negative for gait problem and neck stiffness.  Skin:  Negative for rash.  Neurological:  Positive for weakness. Negative for syncope and facial asymmetry.  Psychiatric/Behavioral:  Negative for confusion and hallucinations.   See other pertinent positives  and negatives in HPI.  Current Outpatient Medications on File Prior to Visit  Medication Sig Dispense Refill   busPIRone (BUSPAR) 5 MG tablet Take 1 tablet (5 mg total) by mouth 2 (two) times daily for 14 days, THEN 1 tablet (5 mg total) daily for 16 days. 44 tablet 1   Cholecalciferol (VITAMIN D) 2000 UNITS tablet Take 2,000 Units by mouth daily.     desonide (DESONATE) 0.05 % gel Apply 1 application topically 2 (two) times daily as needed.      diltiazem (CARDIZEM) 90 MG tablet Take 1/2 tablet by mouth every 8 hours. 135 tablet 2   entecavir (BARACLUDE) 1 MG tablet Take 1 tablet (1 mg total) by mouth daily. 30 tablet 6   glucose blood (ONETOUCH ULTRA) test strip USE TO TEST BLOOD SUGAR 3 TIMES DAILY 100 strip 11   Lancets (ONETOUCH ULTRASOFT) lancets USE AS INSTRUCTED TO CHECK BLOOD SUGARS THREE TIMES A DAY 200 each 3   metFORMIN (GLUCOPHAGE) 500 MG tablet Take 2 tablets (1,000 mg total) by mouth 2 (two) times daily with a meal. 360 tablet 3   Multiple Vitamin (MULTIVITAMIN WITH MINERALS) TABS tablet Take 1 tablet by mouth daily. 100 tablet 0   pantoprazole (PROTONIX) 40 MG tablet TAKE 1 TABLET BY MOUTH 2 TIMES A DAY BEFORE A MEAL 60 tablet 5   predniSONE (DELTASONE) 10 MG tablet Take 10 mg by mouth daily with breakfast.     pyridostigmine (MESTINON) 60 MG tablet Take 60 mg by mouth 3 (three) times daily.     repaglinide (PRANDIN) 0.5 MG tablet Take 1 tablet (0.5 mg total) by mouth daily before supper. 90 tablet 3   sitaGLIPtin (JANUVIA) 100 MG tablet Take 1 tablet (100 mg total) by mouth daily. 90 tablet 3   No current facility-administered medications on file prior to visit.    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)    Spongiotic dermatitis    Type II or unspecified type diabetes mellitus without mention of complication, not stated as uncontrolled    Viral hepatitis B without mention of hepatic coma, chronic, without mention of hepatitis delta    Allergies  Allergen Reactions   Azithromycin Other (See Comments)    REACTION: exac of his mg   Beta Adrenergic Blockers Other (See Comments)    REACTION: exac of mg   Calcium Channel Blockers Other (See Comments)    Medications that may worsen or trigger MG exacerbation: Class IA antiarrhythmics, magnesium, flouroquinolones, macrolides, aminoglycosides, penicillamine, curare, interferon alpha, botox, quinine. Use with caution: calcium channel blockers, beta blockers and statins.    Macrolides And Ketolides Other (See Comments)    Medications  that may worsen or trigger MG exacerbation: Class IA antiarrhythmics, magnesium, flouroquinolones, macrolides, aminoglycosides, penicillamine, curare, interferon alpha, botox, quinine. Use with caution: calcium channel blockers, beta blockers and statins.    Statins Other (See Comments)    Medications that may worsen or trigger MG exacerbation: Class IA antiarrhythmics, magnesium, flouroquinolones, macrolides, aminoglycosides, penicillamine, curare, interferon alpha, botox, quinine. Use with caution: calcium channel blockers, beta blockers and statins.     Social History   Socioeconomic History   Marital status: Married    Spouse name: Not on file   Number of children: Not on file   Years of education: Not on file   Highest education level: Not on file  Occupational History   Not on file  Tobacco Use   Smoking status: Never   Smokeless tobacco: Never  Substance and Sexual Activity   Alcohol use: No   Drug use: No   Sexual activity: Not on file  Other Topics Concern   Not on file  Social History Narrative   Not on file   Social  Determinants of Health   Financial Resource Strain: Not on file  Food Insecurity: No Food Insecurity (07/24/2022)   Hunger Vital Sign    Worried About Running Out of Food in the Last Year: Never true    Ran Out of Food in the Last Year: Never true  Recent Concern: Food Insecurity - Food Insecurity Present (06/16/2022)   Hunger Vital Sign    Worried About Running Out of Food in the Last Year: Sometimes true    Ran Out of Food in the Last Year: Never true  Transportation Needs: No Transportation Needs (07/19/2022)   PRAPARE - Administrator, Civil Service (Medical): No    Lack of Transportation (Non-Medical): No  Physical Activity: Not on file  Stress: Not on file  Social Connections: Not on file    Vitals:   01/01/23 1414  BP: 118/70  Pulse: 100  Resp: 16  Temp: 98.7 F (37.1 C)  SpO2: 99%   Wt Readings from Last 3 Encounters:  01/01/23 101 lb 6 oz (46 kg)  11/07/22 105 lb (47.6 kg)  09/22/22 104 lb 8 oz (47.4 kg)   Body mass index is 16.87 kg/m.  Physical Exam Vitals and nursing note reviewed.  Constitutional:      General: He is not in acute distress.    Appearance: He is well-developed.  HENT:     Head: Normocephalic and atraumatic.     Mouth/Throat:     Mouth: Mucous membranes are moist.  Eyes:     Conjunctiva/sclera: Conjunctivae normal.  Cardiovascular:     Rate and Rhythm: Normal rate and regular rhythm.     Pulses:          Posterior tibial pulses are 2+ on the right side and 2+ on the left side.     Heart sounds: No murmur heard. Pulmonary:     Effort: Pulmonary effort is normal. No respiratory distress.     Breath sounds: Normal breath sounds.  Abdominal:     Palpations: Abdomen is soft. There is no hepatomegaly or mass.     Tenderness: There is no abdominal tenderness.  Musculoskeletal:     Right lower leg: No edema.     Left lower leg: No edema.  Lymphadenopathy:     Cervical: No cervical adenopathy.  Skin:    General: Skin is warm.      Findings: No erythema or rash.  Neurological:  General: No focal deficit present.     Mental Status: He is alert and oriented to person, place, and time.     Cranial Nerves: No cranial nerve deficit.     Motor: No weakness or tremor.     Gait: Gait normal.     Deep Tendon Reflexes:     Reflex Scores:      Bicep reflexes are 2+ on the right side and 2+ on the left side.      Patellar reflexes are 2+ on the right side and 2+ on the left side.    Comments: Minimal right upper eye lid ptosis.   Psychiatric:        Mood and Affect: Affect normal. Mood is anxious.   ASSESSMENT AND PLAN:  Mr. Toole was seen today for lip tightness and extremity weakness.   Underweight (BMI < 18.5) 11/07/22 he was 105 Lb. He reports a good appetite but has perioral fatigue with chewing. Recommend small bites of food every 3 hours, specially protein source. We discussed the importance of adequate protein intake. TSH mildly abnormal at 0.3 on 12/04/22.  Myasthenia gravis [facility]) Assessment & Plan: Symptoms he is reporting today he has had for a while and similar to those he had when he was first Dx'ed with this problem and has had it intermittent since then. Reports having similar symptoms before and after hospitalization in 07/2022.  ? Bulbar MG presentation (dysarthria, chewing fatigability),he denies dysphagia. Symptoms seem to improved after taking his Prednisone and Pyridostigmine but reoccur later during the day. Recommend discussing these symptoms with his neurologist and to discussed other treatment options. Clearly instructed about warning signs.   Hypertension, essential, benign Assessment & Plan: BP adequately controlled, he is reporting BP's 120-130/70-80 at home. Continue diltiazem 90 mg 1/2 tablet 3 times daily as well as low salt diet.  Anemia, unspecified type Last colonoscopy in 2008. Negative FOBT in 07/2022. Will arrange labs for CBC and further recommendations will be given  according to lab results.  -     CBC with Differential/Platelet; Future  I spent a total of 44 minutes in both face to face and non face to face activities for this visit on the date of this encounter. During this time history was obtained and documented, examination was performed, prior labs/imaging reviewed, and assessment/plan discussed.  Return if symptoms worsen or fail to improve.  I, Rolla Etienne Wierda, acting as a scribe for Deshaun Weisinger [place], MD., have documented all relevant documentation on the behalf of Natajah Derderian [place], MD, as directed by  Terez Montee [place], MD while in the presence of Imran Nuon [place], MD.   I, Mirjana Tarleton [place], MD, have reviewed all documentation for this visit. The documentation on 01/01/23 for the exam, diagnosis, procedures, and orders are all accurate and complete.  Emory Gallentine G. [place], MD  [facility]. [place] office.

## 2023-01-02 ENCOUNTER — Telehealth: Payer: Self-pay

## 2023-01-02 NOTE — Telephone Encounter (Signed)
I talked with patient, he will call back to schedule lab appt. Orders are in.

## 2023-01-02 NOTE — Telephone Encounter (Signed)
-----   Message from Betty [place] sent at 01/01/2023  9:07 PM EST ----- Regarding: Lab I was reviewing lab and noted anemia during last hospitalization. I added lab, can you please call pt and ask his to some for labs. Thanks, BJ

## 2023-01-04 DIAGNOSIS — G7000 Myasthenia gravis without (acute) exacerbation: Secondary | ICD-10-CM | POA: Diagnosis not present

## 2023-01-04 DIAGNOSIS — G7001 Myasthenia gravis with (acute) exacerbation: Secondary | ICD-10-CM | POA: Diagnosis not present

## 2023-01-09 ENCOUNTER — Ambulatory Visit (HOSPITAL_COMMUNITY): Payer: Medicare Other | Admitting: Psychiatry

## 2023-01-11 DIAGNOSIS — G7000 Myasthenia gravis without (acute) exacerbation: Secondary | ICD-10-CM | POA: Diagnosis not present

## 2023-01-16 DIAGNOSIS — G7000 Myasthenia gravis without (acute) exacerbation: Secondary | ICD-10-CM | POA: Diagnosis not present

## 2023-01-18 DIAGNOSIS — G7000 Myasthenia gravis without (acute) exacerbation: Secondary | ICD-10-CM | POA: Diagnosis not present

## 2023-01-19 DIAGNOSIS — G7000 Myasthenia gravis without (acute) exacerbation: Secondary | ICD-10-CM | POA: Diagnosis not present

## 2023-01-22 DIAGNOSIS — G7000 Myasthenia gravis without (acute) exacerbation: Secondary | ICD-10-CM | POA: Diagnosis not present

## 2023-01-22 DIAGNOSIS — Z452 Encounter for adjustment and management of vascular access device: Secondary | ICD-10-CM | POA: Diagnosis not present

## 2023-01-22 DIAGNOSIS — G4700 Insomnia, unspecified: Secondary | ICD-10-CM | POA: Diagnosis not present

## 2023-02-04 ENCOUNTER — Other Ambulatory Visit: Payer: Self-pay | Admitting: Family Medicine

## 2023-02-05 ENCOUNTER — Other Ambulatory Visit: Payer: Self-pay | Admitting: Internal Medicine

## 2023-02-05 DIAGNOSIS — I10 Essential (primary) hypertension: Secondary | ICD-10-CM

## 2023-02-07 ENCOUNTER — Other Ambulatory Visit: Payer: Self-pay | Admitting: Family Medicine

## 2023-02-12 ENCOUNTER — Ambulatory Visit: Payer: Medicare Other | Admitting: Internal Medicine

## 2023-02-12 ENCOUNTER — Encounter: Payer: Self-pay | Admitting: Internal Medicine

## 2023-02-12 VITALS — BP 120/80 | HR 106 | Ht 65.0 in | Wt 100.4 lb

## 2023-02-12 DIAGNOSIS — R809 Proteinuria, unspecified: Secondary | ICD-10-CM

## 2023-02-12 DIAGNOSIS — Z7984 Long term (current) use of oral hypoglycemic drugs: Secondary | ICD-10-CM

## 2023-02-12 DIAGNOSIS — E119 Type 2 diabetes mellitus without complications: Secondary | ICD-10-CM | POA: Diagnosis not present

## 2023-02-12 DIAGNOSIS — E1129 Type 2 diabetes mellitus with other diabetic kidney complication: Secondary | ICD-10-CM | POA: Diagnosis not present

## 2023-02-12 DIAGNOSIS — E0590 Thyrotoxicosis, unspecified without thyrotoxic crisis or storm: Secondary | ICD-10-CM | POA: Diagnosis not present

## 2023-02-12 LAB — POCT GLYCOSYLATED HEMOGLOBIN (HGB A1C): Hemoglobin A1C: 5.9 % — AB (ref 4.0–5.6)

## 2023-02-12 MED ORDER — REPAGLINIDE 0.5 MG PO TABS: 0.5000 mg | ORAL_TABLET | Freq: Every day | ORAL | 3 refills | Status: DC

## 2023-02-12 MED ORDER — SITAGLIPTIN PHOSPHATE 100 MG PO TABS: 100.0000 mg | ORAL_TABLET | Freq: Every day | ORAL | 3 refills | Status: AC

## 2023-02-12 MED ORDER — METFORMIN HCL 500 MG PO TABS: 1000.0000 mg | ORAL_TABLET | Freq: Two times a day (BID) | ORAL | 3 refills | Status: AC

## 2023-02-12 NOTE — Patient Instructions (Signed)
 Continue Januvia  100 mg daily with Breakfast Continue Metformin  500 mg, 2 tablets with Breakfast and 2 tablet with supper   HOW TO TREAT LOW BLOOD SUGARS (Blood sugar LESS THAN 70 MG/DL) Please follow the RULE OF 15 for the treatment of hypoglycemia treatment (when your (blood sugars are less than 70 mg/dL)   STEP 1: Take 15 grams of carbohydrates when your blood sugar is low, which includes:  3-4 GLUCOSE TABS  OR 3-4 OZ OF JUICE OR REGULAR SODA OR ONE TUBE OF GLUCOSE GEL    STEP 2: RECHECK blood sugar in 15 MINUTES STEP 3: If your blood sugar is still low at the 15 minute recheck --> then, go back to STEP 1 and treat AGAIN with another 15 grams of carbohydrates.
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
## 2023-02-12 NOTE — Progress Notes (Signed)
 Name: [name]  Age/ Sex: 68 y.o., male   MRN/ DOB: [id number], [birth year]     PCP: Jordan, [name], MD   Reason for Endocrinology Evaluation: Type 2 Diabetes Mellitus  Initial Endocrine Consultative Visit: 03/06/2012    PATIENT IDENTIFIER: [name] is a 68 y.o. male with a past medical history of DM, HTN , Myasthenia Gravis and Hep B chronic infection the patient has followed with Endocrinology clinic since 03/06/2012 for consultative assistance with management of his diabetes.  DIABETIC HISTORY:  [name] was diagnosed with DM 2005. His hemoglobin A1c has ranged from 5.8% in 2022, peaking at 6.5% in 2018.     He was followed by Dr, Kassie from 2014 until 05/02/2021  SUBJECTIVE:   During the last visit (08/09/2022): A1c 5.8%    Today (02/12/2023): [name] is here for follow-up on diabetes management.  He checks his blood sugars  1x daily. The patient has not had hypoglycemic episodes since the last clinic visit.   Patient follows with [facility] neurology for myasthenia gravis, s/p thymectomy 2004 with malignant thymoma treated with radiation .  He is undergoing apheresis through [facility] neurology 12/2022   Patient follows with [facility] for hepatitis B   Patient is on prednisone  through neurology  Denies nausea, vomiting Denies constipation  or diarrhea  Denies palpitations    HOME ENDOCRINE REGIMEN:  Januvia  100 mg daily Metformin  500 mg, 2 tabs twice daily Repaglinide  0.5 mg, 1 tablet before supper- not taking  Prednisone  10 mg daily per neurology    Statin: No ACE-I/ARB: Yes   METER DOWNLOAD SUMMARY:   74-196 mg/dl     DIABETIC COMPLICATIONS: Microvascular complications:   Denies: CKD , retinopathy,neuropathy Last Eye Exam: Completed 12/06/2022  Macrovascular complications:   Denies: CAD, CVA, PVD   HISTORY:  Past Medical History:  Past Medical History:  Diagnosis Date   Anxiety    Chronic post-traumatic  stress disorder (PTSD)    Colon polyp    Depressive disorder, not elsewhere classified    External hemorrhoid    Insomnia, unspecified    Internal hemorrhoids    Myasthenia gravis without exacerbation (HCC)    Other and unspecified hyperlipidemia    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 He has never used smokeless tobacco. He reports that he does not drink alcohol  and does not use drugs. Family History: No family history on file.   HOME MEDICATIONS: Allergies as of 02/12/2023       Reactions   Azithromycin Other (See Comments)   REACTION: exac of his mg   Beta Adrenergic Blockers Other (See Comments)   REACTION: exac of mg   Calcium  Channel Blockers Other (See Comments)   Medications that may worsen or trigger MG exacerbation: Class IA antiarrhythmics, magnesium , flouroquinolones, macrolides, aminoglycosides, penicillamine, curare, interferon alpha, botox, quinine. Use with caution: calcium  channel blockers, beta blockers and statins.    Macrolides And Ketolides Other (See Comments)   Medications that may worsen or trigger MG exacerbation: Class IA antiarrhythmics, magnesium , flouroquinolones, macrolides, aminoglycosides, penicillamine, curare, interferon alpha,  botox, quinine. Use with caution: calcium  channel blockers, beta blockers and statins.    Statins Other (See Comments)   Medications that may worsen or trigger MG exacerbation: Class IA antiarrhythmics, magnesium , flouroquinolones, macrolides, aminoglycosides, penicillamine, curare, interferon alpha, botox, quinine. Use with caution:  calcium  channel blockers, beta blockers and statins.         Medication List        Accurate as of February 12, 2023  2:10 PM. If you have any questions, ask your nurse or doctor.          STOP taking these medications    colesevelam  625 MG tablet Commonly known as: WELCHOL  Stopped by: [name]       TAKE these medications    desonide 0.05 % gel Commonly known as: DESONATE Apply 1 application topically 2 (two) times daily as needed.   diltiazem  90 MG tablet Commonly known as: CARDIZEM  Take 1/2 tablet by mouth every 8 hours.   entecavir  1 MG tablet Commonly known as: BARACLUDE  Take 1 tablet (1 mg total) by mouth daily.   metFORMIN  500 MG tablet Commonly known as: GLUCOPHAGE  Take 2 tablets (1,000 mg total) by mouth 2 (two) times daily with a meal.   multivitamin with minerals Tabs tablet Take 1 tablet by mouth daily.   OneTouch Ultra test strip Generic drug: glucose blood USE TO TEST BLOOD SUGAR 3 TIMES DAILY   onetouch ultrasoft lancets USE AS INSTRUCTED TO CHECK BLOOD SUGARS THREE TIMES A DAY   pantoprazole  40 MG tablet Commonly known as: PROTONIX  TAKE 1 TABLET BY MOUTH 2 TIMES A DAY BEFORE A MEAL   predniSONE  10 MG tablet Commonly known as: DELTASONE  Take 10 mg by mouth daily with breakfast.   pyridostigmine  60 MG tablet Commonly known as: MESTINON  Take 60 mg by mouth 3 (three) times daily.   repaglinide  0.5 MG tablet Commonly known as: PRANDIN  Take 1 tablet (0.5 mg total) by mouth daily before supper.   sitaGLIPtin  100 MG tablet Commonly known as: Januvia  Take 1 tablet (100 mg total) by mouth daily.   triamcinolone  cream 0.1 % Commonly known as: KENALOG  SMARTSIG:1 Application Topical 2-3 Times Daily   Vitamin D  50 MCG (2000 UT) tablet Take 2,000 Units by mouth daily.         OBJECTIVE:   Vital Signs: BP 120/80 (BP Location: Left Arm, Patient Position: Sitting, Cuff Size: Small)   Pulse (!) 106   Ht 5' 5 (1.651 m)    Wt 100 lb 6.4 oz (45.5 kg)   SpO2 99%   BMI 16.71 kg/m   Wt Readings from Last 3 Encounters:  02/12/23 100 lb 6.4 oz (45.5 kg)  01/01/23 101 lb 6 oz (46 kg)  11/07/22 105 lb (47.6 kg)     Exam: General: Pt appears well and is in NAD  Lungs: Clear with good BS bilat   Heart: RRR   Extremities: No pretibial edema.   Neuro: MS is good with appropriate affect, pt is alert and Ox3   DM FOOT EXAM 08/09/2022   The skin of the feet is intact without sores or ulcerations. The pedal pulses are 2+ on right and 2+ on left. The sensation is intact to a screening 5.07, 10 gram monofilament bilaterally   DATA REVIEWED:  Lab Results  Component Value Date   HGBA1C 5.9 (A) 02/12/2023   HGBA1C 5.8 (A) 08/09/2022   HGBA1C 5.7 (H) 06/16/2022    Latest Reference Range & Units 02/12/23 14:39  Sodium  135 - 146 mmol/L 137  Potassium 3.5 - 5.3 mmol/L 4.3  Chloride 98 - 110 mmol/L 97 (L)  CO2 20 - 32 mmol/L 28  Glucose 65 - 99 mg/dL 852 (H)  BUN 7 - 25 mg/dL 14  Creatinine 9.29 - 8.64 mg/dL 9.12  Calcium  8.6 - 10.3 mg/dL 9.8  BUN/Creatinine Ratio 6 - 22 (calc) SEE NOTE:  MICROALB/CREAT RATIO <30 mg/g creat 33 (H)    Latest Reference Range & Units 02/12/23 14:39  TSH 0.40 - 4.50 mIU/L 0.36 (L)  T4,Free(Direct) 0.8 - 1.8 ng/dL 1.2    Latest Reference Range & Units 02/12/23 14:39  Microalb, Ur mg/dL 2.7  MICROALB/CREAT RATIO <30 mg/g creat 33 (H)  Creatinine, Urine 20 - 320 mg/dL 82     88/05/7973 TSH 9.68 uIU/mL FT4 0.85   Old records , labs and images have been reviewed.   ASSESSMENT / PLAN / RECOMMENDATIONS:   1) Type 2 Diabetes Mellitus, Optimally controlled, Without complications - Most recent A1c of 5.9 %. Goal A1c < 7.0 %.     -Patient on chronic prednisone  therapy due to myasthenia gravis -His A1c is skewed due to plasmapheresis -His fasting BG's have been optimal, he has not been taking repaglinide  as his appetite continues to be low but he would like to continue to  keep repaglinide  on his list -No changes at this time  MEDICATIONS: Continue metformin  500 mg 2 tabs twice daily Continue Januvia  100 mg daily Take repaglinide  0.5 mg, 1 tab before supper, when on regular diet  EDUCATION / INSTRUCTIONS: BG monitoring instructions: Patient is instructed to check his blood sugars 1 times a day, may alternate before breakfast, lunch, and supper. Call [facility] if: BG persistently < 70  I reviewed the Rule of 15 for the treatment of hypoglycemia in detail with the patient. Literature supplied.    2) Diabetic complications:  Eye: Does not have known diabetic retinopathy.  Neuro/ Feet: Does not have known diabetic peripheral neuropathy .  Renal: Patient does not have known baseline CKD. He   is  on an ACEI/ARB at present.     3. Subclinical Hyperthyroidism:  -Patient is clinically euthyroid -TSH has increased from 0.31 u IU/mL in November, 2024 to 0.36 u IU/mL on today's labs -No intervention and will continue to monitor   4.  Microalbuminuria:   -This is new -Patient is not on ACEi nor an ARB -We will continue to monitor as BP is borderline and if this persists or worsens we will consider treatment   F/U in 4 months   Attempted to contact the patient 02/15/2023.  Left a voicemail, a letter was sent  Signed electronically by: Matthew Redgie Butts, MD  [facility] Endocrinology  [facility] [address] [place], [place] [address] Phone: [phone] FAX: [phone]   CC: Jordan, [name], MD [address] [place] [address] Phone: [phone]  Fax: [phone]  Return to Endocrinology clinic as below: Future Appointments  Date Time Provider Department Center  02/13/2023  4:30 PM Carvin Arvella HERO, MD [facility] None  06/12/2023 11:10 AM Kandy Towery, Matthew Redgie, MD [facility] None

## 2023-02-13 ENCOUNTER — Ambulatory Visit (HOSPITAL_BASED_OUTPATIENT_CLINIC_OR_DEPARTMENT_OTHER): Payer: Medicare Other | Admitting: Psychiatry

## 2023-02-13 VITALS — BP 149/78 | HR 109 | Ht 65.0 in | Wt 101.0 lb

## 2023-02-13 DIAGNOSIS — F4310 Post-traumatic stress disorder, unspecified: Secondary | ICD-10-CM

## 2023-02-13 DIAGNOSIS — F4323 Adjustment disorder with mixed anxiety and depressed mood: Secondary | ICD-10-CM

## 2023-02-13 LAB — BASIC METABOLIC PANEL
BUN: 14 mg/dL (ref 7–25)
CO2: 28 mmol/L (ref 20–32)
Calcium: 9.8 mg/dL (ref 8.6–10.3)
Chloride: 97 mmol/L — ABNORMAL LOW (ref 98–110)
Creat: 0.87 mg/dL (ref 0.70–1.35)
Glucose, Bld: 147 mg/dL — ABNORMAL HIGH (ref 65–99)
Potassium: 4.3 mmol/L (ref 3.5–5.3)
Sodium: 137 mmol/L (ref 135–146)

## 2023-02-13 LAB — T4, FREE: Free T4: 1.2 ng/dL (ref 0.8–1.8)

## 2023-02-13 LAB — MICROALBUMIN / CREATININE URINE RATIO
Creatinine, Urine: 82 mg/dL (ref 20–320)
Microalb Creat Ratio: 33 mg/g{creat} — ABNORMAL HIGH (ref <30)
Microalb, Ur: 2.7 mg/dL

## 2023-02-13 LAB — TSH: TSH: 0.36 m[IU]/L — ABNORMAL LOW (ref 0.40–4.50)

## 2023-02-14 ENCOUNTER — Encounter (HOSPITAL_COMMUNITY): Payer: Self-pay | Admitting: Psychiatry

## 2023-02-15 ENCOUNTER — Encounter: Payer: Self-pay | Admitting: Internal Medicine

## 2023-02-15 ENCOUNTER — Telehealth: Payer: Self-pay | Admitting: Internal Medicine

## 2023-02-15 DIAGNOSIS — E1129 Type 2 diabetes mellitus with other diabetic kidney complication: Secondary | ICD-10-CM | POA: Insufficient documentation

## 2023-02-15 DIAGNOSIS — E0590 Thyrotoxicosis, unspecified without thyrotoxic crisis or storm: Secondary | ICD-10-CM | POA: Insufficient documentation

## 2023-02-15 DIAGNOSIS — E119 Type 2 diabetes mellitus without complications: Secondary | ICD-10-CM | POA: Diagnosis not present

## 2023-02-15 DIAGNOSIS — G7000 Myasthenia gravis without (acute) exacerbation: Secondary | ICD-10-CM | POA: Diagnosis not present

## 2023-02-15 NOTE — Telephone Encounter (Signed)
Attempted to contact the pt on Thursday 02/15/2023 . Left a voice mail and a call back  To discuss low TSH and elevated MA/Cr ratio

## 2023-03-04 ENCOUNTER — Emergency Department (HOSPITAL_COMMUNITY): Payer: Medicare Other

## 2023-03-04 ENCOUNTER — Observation Stay (HOSPITAL_COMMUNITY): Payer: Medicare Other

## 2023-03-04 ENCOUNTER — Other Ambulatory Visit: Payer: Self-pay

## 2023-03-04 ENCOUNTER — Inpatient Hospital Stay (HOSPITAL_COMMUNITY)
Admission: EM | Admit: 2023-03-04 | Discharge: 2023-03-09 | DRG: 057 | Disposition: A | Discharge status: Home / Self Care | Payer: Medicare Other | Attending: Internal Medicine | Admitting: Internal Medicine

## 2023-03-04 ENCOUNTER — Encounter (HOSPITAL_COMMUNITY): Payer: Self-pay

## 2023-03-04 DIAGNOSIS — B181 Chronic viral hepatitis B without delta-agent: Secondary | ICD-10-CM | POA: Diagnosis not present

## 2023-03-04 DIAGNOSIS — Z681 Body mass index (BMI) 19 or less, adult: Secondary | ICD-10-CM | POA: Diagnosis not present

## 2023-03-04 DIAGNOSIS — I10 Essential (primary) hypertension: Secondary | ICD-10-CM

## 2023-03-04 DIAGNOSIS — Z951 Presence of aortocoronary bypass graft: Secondary | ICD-10-CM | POA: Diagnosis not present

## 2023-03-04 DIAGNOSIS — R809 Proteinuria, unspecified: Secondary | ICD-10-CM

## 2023-03-04 DIAGNOSIS — K219 Gastro-esophageal reflux disease without esophagitis: Secondary | ICD-10-CM | POA: Diagnosis not present

## 2023-03-04 DIAGNOSIS — Z923 Personal history of irradiation: Secondary | ICD-10-CM

## 2023-03-04 DIAGNOSIS — F339 Major depressive disorder, recurrent, unspecified: Secondary | ICD-10-CM | POA: Diagnosis not present

## 2023-03-04 DIAGNOSIS — G7001 Myasthenia gravis with (acute) exacerbation: Principal | ICD-10-CM | POA: Diagnosis present

## 2023-03-04 DIAGNOSIS — Z860100 Personal history of colon polyps, unspecified: Secondary | ICD-10-CM | POA: Diagnosis not present

## 2023-03-04 DIAGNOSIS — F419 Anxiety disorder, unspecified: Secondary | ICD-10-CM | POA: Diagnosis present

## 2023-03-04 DIAGNOSIS — E1129 Type 2 diabetes mellitus with other diabetic kidney complication: Secondary | ICD-10-CM | POA: Diagnosis not present

## 2023-03-04 DIAGNOSIS — R000 Tachycardia, unspecified: Secondary | ICD-10-CM | POA: Diagnosis not present

## 2023-03-04 DIAGNOSIS — Z4682 Encounter for fitting and adjustment of non-vascular catheter: Secondary | ICD-10-CM | POA: Diagnosis not present

## 2023-03-04 DIAGNOSIS — F411 Generalized anxiety disorder: Secondary | ICD-10-CM | POA: Diagnosis not present

## 2023-03-04 DIAGNOSIS — E119 Type 2 diabetes mellitus without complications: Secondary | ICD-10-CM | POA: Diagnosis not present

## 2023-03-04 DIAGNOSIS — Z7952 Long term (current) use of systemic steroids: Secondary | ICD-10-CM | POA: Diagnosis not present

## 2023-03-04 DIAGNOSIS — Z85238 Personal history of other malignant neoplasm of thymus: Secondary | ICD-10-CM

## 2023-03-04 DIAGNOSIS — Z79899 Other long term (current) drug therapy: Secondary | ICD-10-CM

## 2023-03-04 DIAGNOSIS — R1310 Dysphagia, unspecified: Secondary | ICD-10-CM | POA: Diagnosis not present

## 2023-03-04 DIAGNOSIS — G7000 Myasthenia gravis without (acute) exacerbation: Secondary | ICD-10-CM

## 2023-03-04 DIAGNOSIS — F332 Major depressive disorder, recurrent severe without psychotic features: Secondary | ICD-10-CM

## 2023-03-04 DIAGNOSIS — F4312 Post-traumatic stress disorder, chronic: Secondary | ICD-10-CM | POA: Diagnosis present

## 2023-03-04 DIAGNOSIS — R29810 Facial weakness: Secondary | ICD-10-CM | POA: Diagnosis present

## 2023-03-04 DIAGNOSIS — E1169 Type 2 diabetes mellitus with other specified complication: Secondary | ICD-10-CM

## 2023-03-04 DIAGNOSIS — E785 Hyperlipidemia, unspecified: Secondary | ICD-10-CM | POA: Diagnosis not present

## 2023-03-04 DIAGNOSIS — R4702 Dysphasia: Secondary | ICD-10-CM | POA: Diagnosis not present

## 2023-03-04 DIAGNOSIS — Z7984 Long term (current) use of oral hypoglycemic drugs: Secondary | ICD-10-CM

## 2023-03-04 DIAGNOSIS — Z888 Allergy status to other drugs, medicaments and biological substances status: Secondary | ICD-10-CM | POA: Diagnosis not present

## 2023-03-04 DIAGNOSIS — E0590 Thyrotoxicosis, unspecified without thyrotoxic crisis or storm: Secondary | ICD-10-CM | POA: Diagnosis present

## 2023-03-04 DIAGNOSIS — Z881 Allergy status to other antibiotic agents status: Secondary | ICD-10-CM

## 2023-03-04 DIAGNOSIS — R636 Underweight: Secondary | ICD-10-CM | POA: Diagnosis present

## 2023-03-04 DIAGNOSIS — N179 Acute kidney failure, unspecified: Secondary | ICD-10-CM | POA: Diagnosis not present

## 2023-03-04 LAB — CBC WITH DIFFERENTIAL/PLATELET
Abs Immature Granulocytes: 0 10*3/uL (ref 0.00–0.07)
Basophils Absolute: 0 10*3/uL (ref 0.0–0.1)
Basophils Relative: 0 %
Eosinophils Absolute: 0 10*3/uL (ref 0.0–0.5)
Eosinophils Relative: 0 %
HCT: 33.4 % — ABNORMAL LOW (ref 39.0–52.0)
Hemoglobin: 9.6 g/dL — ABNORMAL LOW (ref 13.0–17.0)
Lymphocytes Relative: 8 %
Lymphs Abs: 0.8 10*3/uL (ref 0.7–4.0)
MCH: 19.8 pg — ABNORMAL LOW (ref 26.0–34.0)
MCHC: 28.7 g/dL — ABNORMAL LOW (ref 30.0–36.0)
MCV: 69 fL — ABNORMAL LOW (ref 80.0–100.0)
Monocytes Absolute: 0.2 10*3/uL (ref 0.1–1.0)
Monocytes Relative: 2 %
Neutro Abs: 8.6 10*3/uL — ABNORMAL HIGH (ref 1.7–7.7)
Neutrophils Relative %: 90 %
Platelets: 389 10*3/uL (ref 150–400)
RBC: 4.84 MIL/uL (ref 4.22–5.81)
RDW: 17.7 % — ABNORMAL HIGH (ref 11.5–15.5)
WBC: 9.5 10*3/uL (ref 4.0–10.5)
nRBC: 0 % (ref 0.0–0.2)
nRBC: 0 /100{WBCs}

## 2023-03-04 LAB — CBG MONITORING, ED
Glucose-Capillary: 98 mg/dL (ref 70–99)
Glucose-Capillary: 101 mg/dL — ABNORMAL HIGH (ref 70–99)
Glucose-Capillary: 86 mg/dL (ref 70–99)

## 2023-03-04 LAB — BASIC METABOLIC PANEL
Anion gap: 11 (ref 5–15)
BUN: 8 mg/dL (ref 8–23)
CO2: 28 mmol/L (ref 22–32)
Calcium: 9.3 mg/dL (ref 8.9–10.3)
Chloride: 96 mmol/L — ABNORMAL LOW (ref 98–111)
Creatinine, Ser: 1.22 mg/dL (ref 0.61–1.24)
GFR, Estimated: >60 mL/min (ref >60)
Glucose, Bld: 141 mg/dL — ABNORMAL HIGH (ref 70–99)
Potassium: 3.8 mmol/L (ref 3.5–5.1)
Sodium: 135 mmol/L (ref 135–145)

## 2023-03-04 MED ORDER — SODIUM CHLORIDE 0.9% FLUSH
3.0000 mL | Freq: Two times a day (BID) | INTRAVENOUS | Status: DC
  Administered 2023-03-04 – 2023-03-09 (×10): 3 mL via INTRAVENOUS

## 2023-03-04 MED ORDER — DEXTROSE 50 % IV SOLN: 1.0000 | INTRAVENOUS | Status: DC | PRN

## 2023-03-04 MED ORDER — ACETAMINOPHEN 650 MG RE SUPP: 650.0000 mg | Freq: Four times a day (QID) | RECTAL | Status: DC | PRN

## 2023-03-04 MED ORDER — ACETAMINOPHEN 325 MG PO TABS
650.0000 mg | ORAL_TABLET | Freq: Four times a day (QID) | ORAL | Status: DC | PRN
  Administered 2023-03-06: 650 mg via ORAL
  Filled 2023-03-04 (×2): qty 2

## 2023-03-04 MED ORDER — IMMUNE GLOBULIN (HUMAN) 10 GM/100ML IV SOLN
400.0000 mg/kg | INTRAVENOUS | Status: AC
  Administered 2023-03-04 – 2023-03-08 (×5): 20 g via INTRAVENOUS
  Filled 2023-03-04 (×7): qty 200

## 2023-03-04 MED ORDER — INSULIN ASPART 100 UNIT/ML IJ SOLN: 0.0000 [IU] | INTRAMUSCULAR | Status: DC

## 2023-03-04 MED ORDER — LACTATED RINGERS IV SOLN
INTRAVENOUS | Status: AC
Start: 2023-03-05 — End: 2023-03-05

## 2023-03-04 MED ORDER — ENOXAPARIN SODIUM 40 MG/0.4ML IJ SOSY
40.0000 mg | PREFILLED_SYRINGE | INTRAMUSCULAR | Status: DC
  Administered 2023-03-04 – 2023-03-06 (×3): 40 mg via SUBCUTANEOUS
  Filled 2023-03-04 (×3): qty 0.4

## 2023-03-04 MED ORDER — HYDRALAZINE HCL 20 MG/ML IJ SOLN: 10.0000 mg | Freq: Four times a day (QID) | INTRAMUSCULAR | Status: DC | PRN

## 2023-03-04 MED ORDER — PANTOPRAZOLE SODIUM 40 MG PO TBEC: 40.0000 mg | DELAYED_RELEASE_TABLET | Freq: Every day | ORAL | Status: DC

## 2023-03-04 MED ORDER — PYRIDOSTIGMINE BROMIDE 60 MG/5ML PO SOLN
60.0000 mg | Freq: Three times a day (TID) | ORAL | Status: DC
  Administered 2023-03-04 (×2): 60 mg
  Filled 2023-03-04 (×4): qty 5

## 2023-03-04 MED ORDER — SODIUM CHLORIDE 0.9 % IV BOLUS
1000.0000 mL | Freq: Once | INTRAVENOUS | Status: AC
  Administered 2023-03-04: 1000 mL via INTRAVENOUS

## 2023-03-04 NOTE — Evaluation (Signed)
Clinical/Bedside Swallow Evaluation Patient Details  Name: Friend Dorfman MRN: [id number] Date of Birth: 04-12-1955  Today's Date: 03/04/2023 Time: SLP Start Time (ACUTE ONLY): 1613 SLP Stop Time (ACUTE ONLY): 1626 SLP Time Calculation (min) (ACUTE ONLY): 13 min  Past Medical History:  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 male presented to the ED with complaint of difficulty with swallowing.  Patient has a history of myasthenia gravis and follows with [facility] neurology for this issue. Per chart he is on daily prednisone and Mestinon TID. Per chart patient says he is really not able to do much more than take a few sips of water about, because food is getting stuck in his throat and difficulty swallowing his pills. He thymoma status post radiation and resection. Per [facility] neurology is recommending medical admission and IVIG therapy. Pt had FEES 06/30/22, trace penetration from diffuse pharyngeal residue and loosened secretions. No aspiration. Recommend initiate ice chips to rehydrate mucosa and start utilizing swallow mechanism. Plan for repeat FEES or MBS in next week to determine tolerance for next steps.    Assessment / Plan / Recommendation  Clinical  Impression  Pt using Yankeurs intermittently for saliva management and reports liquid stops in his throat then comes back up into the left side of oral cavity. His vocal intensity is low with dysarthric speech with clear vocal quality and attempts at volitional cough resembles weak vocalization. He was unable to initiate swallow of his saliva and has right facial/labial weakness upon retraction. He could not protrude lips. There was mild palpable elevation of larynx with ice chips and delayed cough with oral expectoration. He did take one small cup sip of water without significant movement of larynx on palpation. Effortful and multiple swallows were present. No other consistencies were attempted. MD note states per neurology plan is for IVIG therapy. Recommend pt have ice chips to continue using swallowing musculature and ST will follow up likely with instrumental study to determine baseline swallow ability. SLP Visit Diagnosis: Dysphagia, unspecified (R13.10)    Aspiration Risk  Moderate aspiration risk;Risk for inadequate nutrition/hydration;Severe aspiration risk    Diet Recommendation Ice chips PRN after oral care    Supervision: Patient able to self feed Postural Changes: Seated upright at 90 degrees    Other  Recommendations Oral Care Recommendations: Oral care QID    Recommendations for follow up therapy are one component of a multi-disciplinary discharge planning process, led by the attending physician.  Recommendations may be updated based on patient status, additional functional criteria and insurance authorization.  Follow up Recommendations  (TBD)      Assistance Recommended at Discharge    Functional Status Assessment Patient has had a recent decline in  their functional status and demonstrates the ability to make significant improvements in function in a reasonable and predictable amount of time.  Frequency and Duration min 2x/week  2 weeks       Prognosis Prognosis for  improved oropharyngeal function: Fair      Swallow Study   General Date of Onset: 03/04/23 HPI: Duston Haughey is a 68 y.o. male presented to the ED with complaint of difficulty with swallowing.  Patient has a history of myasthenia gravis and follows with [facility] neurology for this issue. Per chart he is on daily prednisone and Mestinon TID. Per chart patient says he is really not able to do much more than take a few sips of water about, because food is getting stuck in his throat and difficulty swallowing his pills. He thymoma status post radiation and resection. Per [facility] neurology is recommending medical admission and IVIG therapy. Pt had FEES 06/30/22, trace penetration from diffuse pharyngeal residue and loosened secretions. No aspiration. Recommend initiate ice chips to rehydrate mucosa and start utilizing swallow mechanism. Plan for repeat FEES or MBS in next week to determine tolerance for next steps. Type of Study: Bedside Swallow Evaluation Previous Swallow Assessment:  (se HPI) Diet Prior to this Study: NPO Temperature Spikes Noted: No Respiratory Status: Room air History of Recent Intubation: No Behavior/Cognition: Alert;Cooperative;Pleasant mood Oral Cavity Assessment: Within Functional Limits Oral Care Completed by SLP: No Oral Cavity - Dentition:  (will need to assess further) Vision: Functional for self-feeding (has right eye ptosis) Self-Feeding Abilities: Able to feed self Patient Positioning: Upright in bed Baseline Vocal Quality: Low vocal intensity Volitional Cough: Weak (sllight vocalization) Volitional Swallow: Unable to elicit    Oral/Motor/Sensory Function Overall Oral Motor/Sensory Function: Mild impairment Facial ROM: Reduced right Facial Symmetry: Abnormal symmetry right Lingual ROM: Within Functional Limits Lingual Symmetry: Within Functional Limits   Ice Chips Ice chips: Impaired Presentation: Spoon Pharyngeal Phase Impairments: Multiple swallows;Cough -  Delayed   Thin Liquid Thin Liquid: Impaired Presentation: Cup Pharyngeal  Phase Impairments: Multiple swallows    Nectar Thick Nectar Thick Liquid: Not tested   Honey Thick Honey Thick Liquid: Not tested   Puree Puree: Not tested   Solid     Solid: Not tested      Royce Macadamia 03/04/2023,5:06 PM

## 2023-03-04 NOTE — Consult Note (Signed)
NEUROLOGY CONSULT NOTE   Date of service: March 04, 2023 Patient Name: [name] MRN:  [id number] DOB:  Nov 07, 1955 Chief Complaint: "Myasthenia gravis flare" Requesting Provider: Terald Sleeper, MD  History of Present Illness  [name] is a 68 y.o. male with hx of myasthenia gravis, diabetes, PTSD, hypertension and chronic hep B infection who presents with increasing dysphagia and difficulty moving his mouth as well as bilateral shoulder weakness.  Patient states that his myasthenia gravis symptoms have been getting worse in the past few days and that since yesterday, he has been unable to swallow any food any medications (including his Mestinon) or his own secretions.  He denies any difficulty breathing or diplopia and states that he is also had some weakness of the right shoulder.  He sees a neurologist at [facility] for his myasthenia gravis and received a Plex treatment in December 2024.   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 No pertinent family history.  Social History  reports that he has never smoked. He has never used smokeless tobacco. He reports that he does not  drink alcohol and does not use drugs.  Allergies  Allergen Reactions   Azithromycin Other (See Comments)    REACTION: exac of his mg   Beta Adrenergic Blockers Other (See Comments)    REACTION: exac of mg   Calcium Channel Blockers Other (See Comments)    Medications that may worsen or trigger MG exacerbation: Class IA antiarrhythmics, magnesium, flouroquinolones, macrolides, aminoglycosides, penicillamine, curare, interferon alpha, botox, quinine. Use with caution: calcium channel blockers, beta blockers and statins.    Macrolides And Ketolides Other (See Comments)    Medications that may worsen or trigger MG exacerbation: Class IA antiarrhythmics, magnesium, flouroquinolones, macrolides, aminoglycosides, penicillamine, curare, interferon alpha, botox, quinine. Use with caution: calcium channel blockers, beta blockers and statins.    Statins Other (See Comments)    Medications that may worsen or trigger MG exacerbation: Class IA antiarrhythmics, magnesium, flouroquinolones, macrolides, aminoglycosides, penicillamine, curare, interferon alpha, botox, quinine. Use with caution: calcium channel blockers, beta blockers and statins.     Medications  No current facility-administered medications for this encounter.  Current Outpatient Medications:    Cholecalciferol (VITAMIN D) 2000 UNITS tablet, Take 2,000 Units by mouth daily., Disp: , Rfl:    desonide (DESONATE) 0.05 % gel, Apply 1 application topically 2 (two) times daily as needed. , Disp: , Rfl:    diltiazem (CARDIZEM) 90 MG tablet, Take 1/2 tablet by mouth every 8 hours., Disp: 135 tablet, Rfl: 2   entecavir (BARACLUDE) 1 MG tablet, Take 1 tablet (1 mg total) by mouth daily., Disp: 30  tablet, Rfl: 6   glucose blood (ONETOUCH ULTRA) test strip, USE TO TEST BLOOD SUGAR 3 TIMES DAILY, Disp: 100 strip, Rfl: 11   Lancets (ONETOUCH ULTRASOFT) lancets, USE AS INSTRUCTED TO CHECK BLOOD SUGARS THREE TIMES A DAY, Disp: 200 each, Rfl: 3   metFORMIN  (GLUCOPHAGE) 500 MG tablet, Take 2 tablets (1,000 mg total) by mouth 2 (two) times daily with a meal., Disp: 360 tablet, Rfl: 3   Multiple Vitamin (MULTIVITAMIN WITH MINERALS) TABS tablet, Take 1 tablet by mouth daily., Disp: 100 tablet, Rfl: 0   pantoprazole (PROTONIX) 40 MG tablet, TAKE 1 TABLET BY MOUTH 2 TIMES A DAY BEFORE A MEAL, Disp: 60 tablet, Rfl: 5   predniSONE (DELTASONE) 10 MG tablet, Take 10 mg by mouth daily with breakfast., Disp: , Rfl:    pyridostigmine (MESTINON) 60 MG tablet, Take 60 mg by mouth 3 (three) times daily., Disp: , Rfl:    repaglinide (PRANDIN) 0.5 MG tablet, Take 1 tablet (0.5 mg total) by mouth daily before supper., Disp: 90 tablet, Rfl: 3   sitaGLIPtin (JANUVIA) 100 MG tablet, Take 1 tablet (100 mg total) by mouth daily., Disp: 90 tablet, Rfl: 3   triamcinolone cream (KENALOG) 0.1 %, SMARTSIG:1 Application Topical 2-3 Times Daily, Disp: , Rfl:   Vitals   Vitals:   03-31-23 1147 2023-03-31 1148 03-31-2023 1449  BP: (!) 167/112  (!) 160/110  Pulse: (!) 133  (!) 118  Resp: 18  17  Temp: 98.2 F (36.8 C)    TempSrc: Oral    SpO2: 97%  100%  Weight:  45.5 kg   Height:  5\' 5"  (1.651 m)     Body mass index is 16.69 kg/m.  Physical Exam   Constitutional: Thin appearing elderly patient in no acute distress Psych: Affect appropriate to situation.  Eyes: No scleral injection.  Drooping of the right eyelid HENT: No OP obstruction.  Head: Normocephalic.  Cardiovascular: Normal rate and regular rhythm.  Respiratory: Effort normal, non-labored breathing.  Able to count to 10 on a single breath Skin: WDI.   Neurologic Examination    NEURO:  Mental Status: Patient is alert and oriented to person place time and situation, able to give clear and coherent history of present illness Speech/Language: moderate dysarthria  No diplopia with sustained upgaze, but bilateral upper eyelids fatigue and droop after 30 seconds  Cranial Nerves:  II: PERRL.  III, IV, VI:  EOMI, R >> L eye ptosis V: Sensation is intact to light touch and symmetrical to face.  VII: Smile is symmetrical. Marked bulbar weakness on cheek puff and tongue protrusion VIII: hearing intact to voice. IX, X: Phonation is normal.  AO:[id number] shrug 4/5 XII: tongue is midline without fasciculations. Motor: 4-/5 strength bilateral shoulder abduction, 4/5 strength bilateral elbow flexion, elbow extension, hand grip and finger spread, 4/5 strength bilateral hip flexion, knee flexion, knee extension, 4+/5 plantarflexion and dorsiflexion Tone: is normal and bulk is normal Sensation- Intact to light touch bilaterally.  Coordination: FTN intact bilaterally Gait- deferred  Labs/Imaging/Neurodiagnostic studies   CBC:  Recent Labs  Lab 03-31-2023 1255  WBC 9.5  NEUTROABS 8.6*  HGB 9.6*  HCT 33.4*  MCV 69.0*  PLT 389   Basic Metabolic Panel:  Lab Results  Component Value Date   NA 135 2023/03/31   K 3.8 31-Mar-2023   CO2 28 03/31/2023   GLUCOSE 141 (H) 2023/03/31   BUN 8 March 31, 2023   CREATININE 1.22 03-31-23   CALCIUM 9.3 March 31, 2023   GFRNONAA >60  03/04/2023   GFRAA >60 09/22/2017   Lipid Panel:  Lab Results  Component Value Date   LDLCALC 68 07/23/2008   HgbA1c:  Lab Results  Component Value Date   HGBA1C 5.9 (A) 02/12/2023   Urine Drug Screen: No results found for: "LABOPIA", "COCAINSCRNUR", "LABBENZ", "AMPHETMU", "THCU", "LABBARB"  Alcohol Level No results found for: "ETH" INR  Lab Results  Component Value Date   INR 0.92 09/17/2017     ASSESSMENT   [name] is a 68 y.o. male with history of myasthenia gravis, diabetes, PTSD, hyperlipidemia and chronic hepatitis B infection who presents with increasing dysphagia and weakness in bilateral shoulders, likely due to myasthenia gravis flare.  He states that he typically has difficulty swallowing and only eat soft food or soups, but as of yesterday was unable to take anything by mouth, could not swallow his own  saliva and could not take any of his medications.  He is able to count to 10 on a single breath and denies dyspnea.  Will need close monitoring of NIF throughout hospital stay.  As patient is unable to take medications by mouth, he has not been able to get his Mestinon for the last day.  Will need to use NG tube to ensure that patient can get Mestinon and other p.o. medications, he is agreeable with this plan.  As he has recently received Plex, we will start 5-day course of IVIG to treat MG exacerbation.  RECOMMENDATIONS  -IVIG 400 mg/kg daily x 5 days - NIF and vital capacity every 6 hours - PT/OT/SLP - Mestinon 60 mg 3 times daily while awake, may use NG tube to ensure patient is able to take this medication - NPO until swallow eval - Recommend elective intubation for respiratory compromise if VC falls below 15 to 20 mL/kg and or NIFs falls below -20cm/H2O. If poor effort and not sure, can always get ABG to assess for CO2 retention. Elective intubation for airway compromise if he has difficulty clearing secretions. Oxygen saturation should not be used to make decision regarding intubation. - Medications that may worsen or trigger MG exacerbation: Class IA antiarrhythmics, magnesium, flouroquinolones, macrolides, aminoglycosides, penicillamine, curare, interferon alpha, botox, quinine. Use with caution: calcium channel blocker, beta blockers and statins. ______________________________________________________________________  Patient seen by NP and then by MD, MD to edit note as needed  Signed, Cortney E Ernestina Columbia, NP [facility]    Attending Neurohospitalist Addendum Patient seen and examined with APP/Resident. Agree with the history and physical as documented above. Agree with the plan as documented, which I helped formulate. I have edited the note above to reflect my full findings and recommendations. I have independently reviewed the chart, obtained history, review of systems  and examined the patient.I have personally reviewed pertinent head/neck/spine imaging (CT/MRI). Please feel free to call with any questions.  I last saw this patient 9 months ago with a similar presentation. He was intubated at that time for acute respiratory failure in the setting of MG flare and responded well to IVIG. He last received PLEX in December, got some benefit for approx 2 weeks per patient. Will give IVIG this admission given his previous good response. There was question of psychiatric overlay at recent presentations likely 2/2 underlying PTSD but his bulbar strength today is much weaker than that documented in Dr. Rosanne Ashing outpatient note 2 weeks ago.  -- Bing Neighbors, MD [facility] [phone]  If 7pm- 7am, please page neurology on call as listed in [facility]ION.

## 2023-03-04 NOTE — ED Notes (Signed)
Pt unable to manage secretions. Pt is Aox4 and was given Yankauer to suction own secretions. Pt demonstrated use appropriately.

## 2023-03-04 NOTE — ED Notes (Signed)
ECG acquired 03/04/23 12:23 is not for this patient. Portable ECG machine malfunction. The first ECG is this patient.

## 2023-03-04 NOTE — ED Provider Notes (Signed)
[place] EMERGENCY DEPARTMENT AT [facility] Provider Note   CSN: [id number] Arrival date & time: 03/04/23  1055     History  Chief Complaint  Patient presents with   Dysphagia    [name] is a 68 y.o. male presented to the ED with complaint of difficulty with swallowing.  Patient has a history of myasthenia gravis and follows with [facility] neurology for this issue.  He is on daily prednisone and Mestinon TID.  He was seen by his neurologist probably 2 weeks ago in the office with complaint of difficulty with food and dysphagia.  Per my review of that external record and office note the patient has history of "bulbar weakness" from his myasthenia gravis and has had ongoing chronic issues with chewing and swallowing.  He has had IVIG infusions in the past but sometimes seem to help with symptoms, but there is also concern of potential superimposed PTSD or psychiatric stress related condition for which she sees a psychiatrist.  Today the patient says he is really not able to do much more than take a few sips of water about, because food is getting stuck in his throat.  He is having difficulty swallowing his pills.  He last took his medications yesterday but not today.  He says his mouth feels dry and he feels dehydrated.  He also reports that his right arm is feeling weak which is typically a condition with his myasthenia gravis flareup.  He has noted to have chronic ptosis, right side of the eye greater than the left.  He also has a history of thymoma status post radiation and resection.  02/15/23 Neuro office CN evaluation at  [facility]  There is right fatigable mild to moderately severe R ptosis but no L ptosis today. He has no diplopia to the right with red lens testing. He no longer drops his head to his chest nor closes his eyes during the interview. Eye closure is mild to moderately weak (but is effort dependent-but clearly better than at last visit. cheek puff is minimally weak vs  effort. He can pucker tightly but not whistle). Lip function appears normal while speaking. No dysarthria. Neck flexors and extensors are strong. At rest no facial weakness is noted and has full animation without fatigue. There is some inconsistency. On formal exam he has minimal or no weakness of cheek puff and fatigable eye closure, but without a Bell's phenomena. He has no dysarthria. He can articulate consonants without dysarthria. He is again depressed in appearance as at previous visit and rarely smiles. Tongue is strong with protrusion into the cheek. Palate rises symmetrically and full. No dysarthria No masseter weakness or tongue weakness. Motor: full strength including neck flexion and extension and normal muscle bulk and tone and strength in all limbs. No SOB with muscle testing Deep tendon reflexes: 2+ throughout the BUEs and BLEs including Achilles; reflexes are symmetric today with less guarding and more Relaxation.   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 Authorizing Provider  Cholecalciferol (VITAMIN D) 2000 UNITS tablet Take 2,000 Units by mouth daily.    [provider]  desonide (DESONATE) 0.05 % gel Apply 1 application topically 2 (two) times daily as needed.  [provider]  diltiazem (CARDIZEM) 90 MG tablet Take 1/2 tablet by mouth every 8 hours. 11/24/22   [place], [name], MD  entecavir (BARACLUDE) 1 MG tablet Take 1 tablet (1 mg total) by mouth daily. 07/13/11   Brooke Dare, MD  glucose blood [facility] ULTRA) test strip USE TO TEST BLOOD SUGAR 3 TIMES DAILY 11/24/22   Shamleffer, Konrad Dolores, MD  Lancets [facility] ULTRASOFT) lancets USE AS INSTRUCTED TO CHECK BLOOD SUGARS THREE TIMES A DAY 11/24/22    Shamleffer, Konrad Dolores, MD  metFORMIN (GLUCOPHAGE) 500 MG tablet Take 2 tablets (1,000 mg total) by mouth 2 (two) times daily with a meal. 02/12/23   Shamleffer, Konrad Dolores, MD  Multiple Vitamin (MULTIVITAMIN WITH MINERALS) TABS tablet Take 1 tablet by mouth daily. 07/17/22   Love, Evlyn Kanner, PA-C  pantoprazole (PROTONIX) 40 MG tablet TAKE 1 TABLET BY MOUTH 2 TIMES A DAY BEFORE A MEAL 12/08/22   [place], [name], MD  predniSONE (DELTASONE) 10 MG tablet Take 10 mg by mouth daily with breakfast.    [provider]  pyridostigmine (MESTINON) 60 MG tablet Take 60 mg by mouth 3 (three) times daily.    [provider]  repaglinide (PRANDIN) 0.5 MG tablet Take 1 tablet (0.5 mg total) by mouth daily before supper. 02/12/23   Shamleffer, Konrad Dolores, MD  sitaGLIPtin (JANUVIA) 100 MG tablet Take 1 tablet (100 mg total) by mouth daily. 02/12/23   Shamleffer, Konrad Dolores, MD  triamcinolone cream (KENALOG) 0.1 % SMARTSIG:1 Application Topical 2-3 Times Daily 09/11/22   [provider]      Allergies    Azithromycin, Beta adrenergic blockers, Calcium channel blockers, Macrolides and ketolides, and Statins    Review of Systems   Review of Systems  Physical Exam Updated Vital Signs BP (!) 160/110   Pulse (!) 118   Temp 98.2 F (36.8 C) (Oral)   Resp 17   Ht 5\' 5"  (1.651 m)   Wt 45.5 kg   SpO2 100%   BMI 16.69 kg/m  Physical Exam Constitutional:      General: He is not in acute distress. HENT:     Head: Normocephalic and atraumatic.     Comments: Tacky mucous membranes,  Eyes:     Conjunctiva/sclera: Conjunctivae normal.     Pupils: Pupils are equal, round, and reactive to light.  Cardiovascular:     Rate and Rhythm: Regular rhythm. Tachycardia present.  Pulmonary:     Effort: Pulmonary effort is normal. No respiratory distress.  Abdominal:     General: There is no distension.     Tenderness: There is no abdominal tenderness.  Skin:    General: Skin  is warm and dry.  Neurological:     Mental Status: He is alert.     Comments: Patient has a thick voice and slow speech, perhaps mild dysarthria, appears to have 5 out of 5 grip strength in the bilateral upper extremities.  4 out of 5 right upper arm abduction strength (deltoid/ bicep) Ptosis noted right eye greater than left Lower extremity strength testing and normal     ED Results / Procedures / Treatments   Labs (all labs ordered are listed, but only abnormal results are displayed) Labs Reviewed  CBC WITH DIFFERENTIAL/PLATELET - Abnormal; Notable for the following components:      Result Value   Hemoglobin 9.6 (*)    HCT 33.4 (*)    MCV 69.0 (*)    MCH 19.8 (*)  MCHC 28.7 (*)    RDW 17.7 (*)    Neutro Abs 8.6 (*)    All other components within normal limits  BASIC METABOLIC PANEL - Abnormal; Notable for the following components:   Chloride 96 (*)    Glucose, Bld 141 (*)    All other components within normal limits    EKG EKG Interpretation Date/Time:  Sunday March 04 2023 12:17:55 EST Ventricular Rate:  130 PR Interval:  144 QRS Duration:  60 QT Interval:  296 QTC Calculation: 435 R Axis:   90  Text Interpretation: Sinus tachycardia Possible Left atrial enlargement When compared with ECG of 06-Jul-2022 06:20, PREVIOUS ECG IS PRESENT Confirmed by Alvester Chou [phone]) on 03/04/2023 12:23:04 PM  Radiology DG Chest 2 View Result Date: 03/04/2023 CLINICAL DATA:  Dysphasia. EXAM: CHEST - 2 VIEW COMPARISON:  07/18/2022 FINDINGS: Sternotomy wires and mediastinal surgical clips are again noted. The heart size appears normal. There is no pleural fluid, interstitial edema or consolidative change. No signs of pneumothorax. IMPRESSION: 1. No acute cardiopulmonary disease. 2. Previous CABG. Electronically Signed   By: Signa Kell M.D.   On: 03/04/2023 14:12    Procedures Procedures    Medications Ordered in ED Medications  sodium chloride 0.9 % bolus 1,000 mL (1,000  mLs Intravenous New Bag/Given 03/04/23 1340)    ED Course/ Medical Decision Making/ A&P Clinical Course as of 03/04/23 1513  Sun Mar 04, 2023  1332 Neurologist Dr Selina Cooley consulted - to see patient [MT]  1502 Dr Selina Cooley neurology recommending medical admission for IVIG [MT]    Clinical Course User Index [MT] Renaye Rakers Kermit Balo, MD                                 Medical Decision Making Amount and/or Complexity of Data Reviewed Labs: ordered.  Risk Decision regarding hospitalization.   Patient is presenting with dysphagia, reporting inability to handle secretions, drink water or take medications.  Differential at this point include myasthenia gravis most likely  I reviewed his external records as noted above including his most recent neurology evaluation at [facility] -bulbar component to his myasthenia gravis, maintenance medications  I reviewed his labs today.  Labs appear to be near baseline levels.  No acute kidney injury.  Patient is given IV fluids for tachycardia and potential dehydration.  Supplemental history is provided by the patient's wife at bedside.  I consulted with neurology is recommending medical admission and IVIG therapy.  Okay for hospitalist telemetry admission.  At this point the patient does not appear to be in acute respiratory failure, although he does have a prior history of requiring intubation.  Per my discussion with the neurologist, he is unlikely to perform well on NIF testing due to difficulty forming a seal and weakness of the lips.  But he is not requiring intubation at this time.  Disposition, plan for medical admission.        Final Clinical Impression(s) / ED Diagnoses Final diagnoses:  Dysphagia, unspecified type    Rx / DC Orders ED Discharge Orders     None         Terald Sleeper, MD 03/04/23 [phone]

## 2023-03-04 NOTE — Progress Notes (Addendum)
Patient is n.p.o. and NG tube has been placed for management of oral medications.  Pending SLP evaluation.  Admitted for myasthenia gravis crisis.   RN reported that patient blood glucose continuing to trending down and patient is n.p.o.  Discontinued scheduled insulin.  Continue to check POC blood glucose and D50 amp as needed for hypoglycemia. -Patient also has evidence of AKI starting maintenance fluid LR 75 cc/h.  Tereasa Coop, MD [facility] 03/04/2023, 11:56 PM

## 2023-03-04 NOTE — ED Provider Triage Note (Signed)
Emergency Medicine Provider Triage Evaluation Note  [name] , a 68 y.o. male  was evaluated in triage.  Pt complains of dysphagia.  Feels like he is having a flare of his myasthenia gravis which he has a history of.  He is unable to swallow his home medication.  Of note, patient was here back in the summer and was intubated from an MG flare, denies any similar symptoms today.  He is not short of breath.  Review of Systems  Positive:  Negative:   Physical Exam  BP (!) 167/112   Pulse (!) 133   Temp 98.2 F (36.8 C) (Oral)   Resp 18   Ht 5\' 5"  (1.651 m)   Wt 45.5 kg   SpO2 97%   BMI 16.69 kg/m  Gen:   Awake, no distress   Resp:  Normal effort  MSK:   Moves extremities without difficulty  Other:    Medical Decision Making  Medically screening exam initiated at 12:05 PM.  Appropriate orders placed.  Sho Kadlec was informed that the remainder of the evaluation will be completed by another provider, this initial triage assessment does not replace that evaluation, and the importance of remaining in the ED until their evaluation is complete.     Silva Bandy, PA-C 03/04/23 1206

## 2023-03-04 NOTE — H&P (Signed)
History and Physical   [name] ZOX:[id number] DOB: 1955/09/22 DOA: 03/04/2023  PCP: [place], [name], MD   Patient coming from: Home  Chief Complaint: Dysphagia  HPI: [name] is a 68 y.o. male with medical history significant of hypertension, diabetes, GERD, subclinical hypothyroidism, thymectomy, myasthenia gravis, anxiety, depression, chronic hepatitis B, PTSD presenting with dysphagia and right extremity weakness.  Patient has known history of myasthenia gravis.  He is followed by neurology at [facility].  Currently on Mestinon and prednisone.  Has been experiencing progressive dysphagia for the past couple of weeks, unable to swallow at all today.  Did have neurology visit 2 weeks ago.  At that time it was noted that patient does have history of worsening myasthenia gravis and some bulbar symptoms and had undergone plasma exchange x 5 with some improvement also had recent improvement with IVIG and having his psychiatric medications adjusted to buspirone.  His presentation has been complicated by psychiatric overlay in the setting of PTSD from experiences in [place].  He did have some dysphagia symptoms at that time as wee; but these have significantly worsened since.  He also is experiencing some right arm weakness which is typical of previous EMG flares.  Denies fevers, chills, chest pain, shortness of breath, abdominal pain, constipation, diarrhea, nausea, vomiting.  ED Course: Vital signs in the ED notable for heart rate in the 100s to 130s, blood pressure in the 160s systolic.  Lab workup included BMP with chloride 96, glucose 141.  CBC with hemoglobin stable at 9.6.  Chest x-ray showed no acute normality and the patient was status post CABG.  Review of Systems: As per HPI otherwise all other systems reviewed and are negative.  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 He has never used smokeless tobacco. He reports that he does not drink alcohol and does not use drugs.  Allergies  Allergen Reactions   Azithromycin Other (See Comments)    REACTION: exac of his mg   Beta Adrenergic Blockers Other (See Comments)    REACTION: exac of mg   Calcium Channel Blockers Other (See Comments)    Medications that may worsen or trigger MG exacerbation: Class IA antiarrhythmics, magnesium, flouroquinolones, macrolides, aminoglycosides, penicillamine, curare, interferon alpha, botox, quinine. Use with caution: calcium channel blockers, beta blockers and statins.    Macrolides And Ketolides Other (See Comments)    Medications that may worsen or trigger MG exacerbation: Class IA antiarrhythmics, magnesium, flouroquinolones, macrolides, aminoglycosides, penicillamine, curare, interferon alpha, botox, quinine. Use with caution: calcium channel blockers, beta blockers and statins.    Statins Other (See Comments)    Medications that may worsen or trigger MG exacerbation: Class IA antiarrhythmics, magnesium, flouroquinolones, macrolides, aminoglycosides,  penicillamine, curare, interferon alpha, botox, quinine. Use with caution: calcium channel blockers, beta blockers and statins.     History reviewed. No pertinent family history.   Prior to  Admission medications   Medication Sig Start Date End Date Taking? Authorizing Provider  Cholecalciferol (VITAMIN D) 2000 UNITS tablet Take 2,000 Units by mouth daily.    [provider]  desonide (DESONATE) 0.05 % gel Apply 1 application topically 2 (two) times daily as needed.     [provider]  diltiazem (CARDIZEM) 90 MG tablet Take 1/2 tablet by mouth every 8 hours. 11/24/22   [place], [name], MD  entecavir (BARACLUDE) 1 MG tablet Take 1 tablet (1 mg total) by mouth daily. 07/13/11   Brooke Dare, MD  glucose blood [facility] ULTRA) test strip USE TO TEST BLOOD SUGAR 3 TIMES DAILY 11/24/22   Shamleffer, Konrad Dolores, MD  Lancets [facility] ULTRASOFT) lancets USE AS INSTRUCTED TO CHECK BLOOD SUGARS THREE TIMES A DAY 11/24/22   Shamleffer, Konrad Dolores, MD  metFORMIN (GLUCOPHAGE) 500 MG tablet Take 2 tablets (1,000 mg total) by mouth 2 (two) times daily with a meal. 02/12/23   Shamleffer, Konrad Dolores, MD  Multiple Vitamin (MULTIVITAMIN WITH MINERALS) TABS tablet Take 1 tablet by mouth daily. 07/17/22   Love, Evlyn Kanner, PA-C  pantoprazole (PROTONIX) 40 MG tablet TAKE 1 TABLET BY MOUTH 2 TIMES A DAY BEFORE A MEAL 12/08/22   [place], [name], MD  predniSONE (DELTASONE) 10 MG tablet Take 10 mg by mouth daily with breakfast.    [provider]  pyridostigmine (MESTINON) 60 MG tablet Take 60 mg by mouth 3 (three) times daily.    [provider]  repaglinide (PRANDIN) 0.5 MG tablet Take 1 tablet (0.5 mg total) by mouth daily before supper. 02/12/23   Shamleffer, Konrad Dolores, MD  sitaGLIPtin (JANUVIA) 100 MG tablet Take 1 tablet (100 mg total) by mouth daily. 02/12/23   Shamleffer, Konrad Dolores, MD  triamcinolone cream (KENALOG) 0.1 % SMARTSIG:1 Application Topical 2-3 Times Daily 09/11/22   [provider]    Physical Exam: Vitals:   03/04/23 1147 03/04/23 1148 03/04/23 1449  BP: (!) 167/112  (!) 160/110  Pulse: (!) 133  (!) 118  Resp: 18  17   Temp: 98.2 F (36.8 C)    TempSrc: Oral    SpO2: 97%  100%  Weight:  45.5 kg   Height:  5\' 5"  (1.651 m)     Physical Exam Constitutional:      General: He is not in acute distress.    Appearance: Normal appearance.  HENT:     Head: Normocephalic and atraumatic.     Mouth/Throat:     Mouth: Mucous membranes are moist.     Pharynx: Oropharynx is clear.  Eyes:     Extraocular Movements: Extraocular movements intact.     Pupils: Pupils are equal, round, and reactive to light.  Cardiovascular:     Rate and Rhythm: Normal rate and regular rhythm.     Pulses: Normal pulses.     Heart sounds: Normal heart sounds.  Pulmonary:     Effort: Pulmonary effort is normal. No respiratory distress.     Breath sounds: Normal breath sounds.  Abdominal:     General: Bowel sounds are normal. There is no distension.     Palpations: Abdomen is soft.     Tenderness: There is no abdominal tenderness.  Musculoskeletal:        General: No swelling or deformity.  Skin:  General: Skin is warm and dry.  Neurological:     Comments: Mental Status: Patient is awake, alert, oriented Ptosis on Right Some pooling of secretions Good effort thorughout, at Least 4/5 RUE 5/5 LUE, 5/5 bilateral lower extremitiy     Labs on Admission: I have personally reviewed following labs and imaging studies  CBC: Recent Labs  Lab 03/04/23 1255  WBC 9.5  NEUTROABS 8.6*  HGB 9.6*  HCT 33.4*  MCV 69.0*  PLT 389    Basic Metabolic Panel: Recent Labs  Lab 03/04/23 1255  NA 135  K 3.8  CL 96*  CO2 28  GLUCOSE 141*  BUN 8  CREATININE 1.22  CALCIUM 9.3    GFR: Estimated Creatinine Clearance: 38.3 mL/min (by C-G formula based on SCr of 1.22 mg/dL).  Liver Function Tests: No results for input(s): "AST", "ALT", "ALKPHOS", "BILITOT", "PROT", "ALBUMIN" in the last 168 hours.  Urine analysis:    Component Value Date/Time   COLORURINE YELLOW 06/24/2022 1106   APPEARANCEUR CLEAR 06/24/2022 1106    LABSPEC 1.014 06/24/2022 1106   PHURINE 6.0 06/24/2022 1106   GLUCOSEU >=500 (A) 06/24/2022 1106   GLUCOSEU NEGATIVE 12/25/2016 1056   HGBUR NEGATIVE 06/24/2022 1106   BILIRUBINUR NEGATIVE 06/24/2022 1106   KETONESUR 20 (A) 06/24/2022 1106   PROTEINUR 30 (A) 06/24/2022 1106   UROBILINOGEN 0.2 12/25/2016 1056   NITRITE NEGATIVE 06/24/2022 1106   LEUKOCYTESUR NEGATIVE 06/24/2022 1106    Radiological Exams on Admission: DG Chest 2 View Result Date: 03/04/2023 CLINICAL DATA:  Dysphasia. EXAM: CHEST - 2 VIEW COMPARISON:  07/18/2022 FINDINGS: Sternotomy wires and mediastinal surgical clips are again noted. The heart size appears normal. There is no pleural fluid, interstitial edema or consolidative change. No signs of pneumothorax. IMPRESSION: 1. No acute cardiopulmonary disease. 2. Previous CABG. Electronically Signed   By: Signa Kell M.D.   On: 03/04/2023 14:12   EKG: Independently reviewed.  Sinus tachycardia to 124 bpm.  Nonspecific T wave changes.  Assessment/Plan Active Problems:   Myasthenia gravis (HCC)   Anxiety disorder, unspecified   Major depressive disorder, recurrent episode (HCC)   GERD   Hypertension, essential, benign   Type 2 diabetes mellitus with diabetic microalbuminuria, without long-term current use of insulin (HCC)   Subclinical hyperthyroidism   Myasthenic crisis (HCC)   Myasthenic crisis > Known history of myasthenia gravis.  History of prior crises.  This time presenting with dysphagia and some right arm weakness which he has had before on presentation. > Is tachycardic but his currently stable from a respiratory standpoint.  Has required intubation in the past. > Neurology has seen the patient and feel he is stable for floor bed at this time. - Monitor on progressive unit overnight - Plan is for IVIG per neurology - NG tube per neurology - Will wait neurology consultation on when to resume Mestinon and prednisone - Scheduled  NIF/VC - PT/OT/SLP    Hypertension - Continue home diltiazem  Diabetes - SSI  GERD - Continue home PPI  Anxiety Depression PTSD > Has had good response to transition to BuSpar per chart review, working to confirm his medication. - Resume BuSpar when confirmed  Chronic hepatitis B - Continue home Entecavir  History of thymectomy > Has some records indicating possible history of CABG based on imaging.  Chart review shows patient had partial sternotomy and thymectomy in 2003 but no history of CABG.  DVT prophylaxis: Lovenox Code Status:   Full Family Communication:  Updated at Bedside  Disposition Plan:   Patient is from:  Home  Anticipated DC to:  Home  Anticipated DC date:  1 to 5 days  Anticipated DC barriers: None  Consults called:  Neurology Admission status:  Observation, progressive  Severity of Illness: The appropriate patient status for this patient is OBSERVATION. Observation status is judged to be reasonable and necessary in order to provide the required intensity of service to ensure the patient's safety. The patient's presenting symptoms, physical exam findings, and initial radiographic and laboratory data in the context of their medical condition is felt to place them at decreased risk for further clinical deterioration. Furthermore, it is anticipated that the patient will be medically stable for discharge from the hospital within 2 midnights of admission.    Synetta Fail MD [facility]  How to contact the [facility] Attending or Consulting provider 7A - 7P or covering provider during after hours 7P -7A, for this patient?   Check the care team in [facility] and look for a) attending/consulting [facility] provider listed and b) the [facility] team listed Log into [email or web address] and use [facility]'s universal password to access. If you do not have the password, please contact the hospital operator. Locate the [facility] provider you are looking for under [facility] and page to a number that you can  be directly reached. If you still have difficulty reaching the provider, please page the [facility] (Director on Call) for the Hospitalists listed on amion for assistance.  03/04/2023, 3:36 PM

## 2023-03-04 NOTE — ED Triage Notes (Signed)
Pt has hx of MG. Pt c.o dysphagia that has been ongoing since December but worse this morning, states he was unable to swallow any of his morning meds and even trying to swallow his saliva is difficult. Pt denies sob, airway patent, pt c.o dry mouth

## 2023-03-04 NOTE — ED Notes (Signed)
Patient states he is feeling much better after pyridostigmine administration through NG tube. Patient states he can now open his eyes, swallow better and speak better.

## 2023-03-04 NOTE — ED Notes (Signed)
 Patient given ice chips.

## 2023-03-05 ENCOUNTER — Observation Stay (HOSPITAL_COMMUNITY): Payer: Medicare Other

## 2023-03-05 DIAGNOSIS — G7001 Myasthenia gravis with (acute) exacerbation: Secondary | ICD-10-CM | POA: Diagnosis present

## 2023-03-05 DIAGNOSIS — N179 Acute kidney failure, unspecified: Secondary | ICD-10-CM | POA: Diagnosis present

## 2023-03-05 DIAGNOSIS — R809 Proteinuria, unspecified: Secondary | ICD-10-CM | POA: Diagnosis not present

## 2023-03-05 DIAGNOSIS — E119 Type 2 diabetes mellitus without complications: Secondary | ICD-10-CM | POA: Diagnosis present

## 2023-03-05 DIAGNOSIS — Z7984 Long term (current) use of oral hypoglycemic drugs: Secondary | ICD-10-CM | POA: Diagnosis not present

## 2023-03-05 DIAGNOSIS — I10 Essential (primary) hypertension: Secondary | ICD-10-CM | POA: Diagnosis present

## 2023-03-05 DIAGNOSIS — B181 Chronic viral hepatitis B without delta-agent: Secondary | ICD-10-CM | POA: Diagnosis present

## 2023-03-05 DIAGNOSIS — Z923 Personal history of irradiation: Secondary | ICD-10-CM | POA: Diagnosis not present

## 2023-03-05 DIAGNOSIS — E785 Hyperlipidemia, unspecified: Secondary | ICD-10-CM | POA: Diagnosis present

## 2023-03-05 DIAGNOSIS — F339 Major depressive disorder, recurrent, unspecified: Secondary | ICD-10-CM | POA: Diagnosis present

## 2023-03-05 DIAGNOSIS — E1169 Type 2 diabetes mellitus with other specified complication: Secondary | ICD-10-CM | POA: Diagnosis not present

## 2023-03-05 DIAGNOSIS — G7000 Myasthenia gravis without (acute) exacerbation: Secondary | ICD-10-CM | POA: Diagnosis not present

## 2023-03-05 DIAGNOSIS — Z951 Presence of aortocoronary bypass graft: Secondary | ICD-10-CM | POA: Diagnosis not present

## 2023-03-05 DIAGNOSIS — R1310 Dysphagia, unspecified: Secondary | ICD-10-CM | POA: Diagnosis present

## 2023-03-05 DIAGNOSIS — F411 Generalized anxiety disorder: Secondary | ICD-10-CM | POA: Diagnosis not present

## 2023-03-05 DIAGNOSIS — Z888 Allergy status to other drugs, medicaments and biological substances status: Secondary | ICD-10-CM | POA: Diagnosis not present

## 2023-03-05 DIAGNOSIS — K219 Gastro-esophageal reflux disease without esophagitis: Secondary | ICD-10-CM | POA: Diagnosis present

## 2023-03-05 DIAGNOSIS — F4312 Post-traumatic stress disorder, chronic: Secondary | ICD-10-CM | POA: Diagnosis present

## 2023-03-05 DIAGNOSIS — E1129 Type 2 diabetes mellitus with other diabetic kidney complication: Secondary | ICD-10-CM | POA: Diagnosis not present

## 2023-03-05 DIAGNOSIS — Z85238 Personal history of other malignant neoplasm of thymus: Secondary | ICD-10-CM | POA: Diagnosis not present

## 2023-03-05 DIAGNOSIS — Z7952 Long term (current) use of systemic steroids: Secondary | ICD-10-CM | POA: Diagnosis not present

## 2023-03-05 DIAGNOSIS — Z79899 Other long term (current) drug therapy: Secondary | ICD-10-CM | POA: Diagnosis not present

## 2023-03-05 DIAGNOSIS — R636 Underweight: Secondary | ICD-10-CM | POA: Diagnosis present

## 2023-03-05 DIAGNOSIS — Z881 Allergy status to other antibiotic agents status: Secondary | ICD-10-CM | POA: Diagnosis not present

## 2023-03-05 DIAGNOSIS — Z681 Body mass index (BMI) 19 or less, adult: Secondary | ICD-10-CM | POA: Diagnosis not present

## 2023-03-05 DIAGNOSIS — Z860100 Personal history of colon polyps, unspecified: Secondary | ICD-10-CM | POA: Diagnosis not present

## 2023-03-05 DIAGNOSIS — R29810 Facial weakness: Secondary | ICD-10-CM | POA: Diagnosis present

## 2023-03-05 DIAGNOSIS — F419 Anxiety disorder, unspecified: Secondary | ICD-10-CM | POA: Diagnosis present

## 2023-03-05 LAB — CBC
HCT: 29.8 % — ABNORMAL LOW (ref 39.0–52.0)
Hemoglobin: 8.7 g/dL — ABNORMAL LOW (ref 13.0–17.0)
MCH: 20 pg — ABNORMAL LOW (ref 26.0–34.0)
MCHC: 29.2 g/dL — ABNORMAL LOW (ref 30.0–36.0)
MCV: 68.7 fL — ABNORMAL LOW (ref 80.0–100.0)
Platelets: 377 10*3/uL (ref 150–400)
RBC: 4.34 MIL/uL (ref 4.22–5.81)
RDW: 17.4 % — ABNORMAL HIGH (ref 11.5–15.5)
WBC: 9.7 10*3/uL (ref 4.0–10.5)
nRBC: 0 % (ref 0.0–0.2)

## 2023-03-05 LAB — COMPREHENSIVE METABOLIC PANEL
ALT: 11 U/L (ref 0–44)
AST: 19 U/L (ref 15–41)
Albumin: 3.8 g/dL (ref 3.5–5.0)
Alkaline Phosphatase: 35 U/L — ABNORMAL LOW (ref 38–126)
Anion gap: 13 (ref 5–15)
BUN: 13 mg/dL (ref 8–23)
CO2: 23 mmol/L (ref 22–32)
Calcium: 9 mg/dL (ref 8.9–10.3)
Chloride: 98 mmol/L (ref 98–111)
Creatinine, Ser: 0.77 mg/dL (ref 0.61–1.24)
GFR, Estimated: >60 mL/min (ref >60)
Glucose, Bld: 86 mg/dL (ref 70–99)
Potassium: 3.2 mmol/L — ABNORMAL LOW (ref 3.5–5.1)
Sodium: 134 mmol/L — ABNORMAL LOW (ref 135–145)
Total Bilirubin: 0.6 mg/dL (ref 0.0–1.2)
Total Protein: 7.2 g/dL (ref 6.5–8.1)

## 2023-03-05 LAB — GLUCOSE, CAPILLARY
Glucose-Capillary: 109 mg/dL — ABNORMAL HIGH (ref 70–99)
Glucose-Capillary: 85 mg/dL (ref 70–99)
Glucose-Capillary: 93 mg/dL (ref 70–99)

## 2023-03-05 LAB — CBG MONITORING, ED
Glucose-Capillary: 79 mg/dL (ref 70–99)
Glucose-Capillary: 73 mg/dL (ref 70–99)
Glucose-Capillary: 177 mg/dL — ABNORMAL HIGH (ref 70–99)

## 2023-03-05 MED ORDER — PYRIDOSTIGMINE BROMIDE 60 MG PO TABS
60.0000 mg | ORAL_TABLET | Freq: Three times a day (TID) | ORAL | Status: DC
Start: 2023-03-06 — End: 2023-03-08
  Administered 2023-03-06 – 2023-03-08 (×8): 60 mg via ORAL
  Filled 2023-03-05 (×8): qty 1

## 2023-03-05 MED ORDER — PYRIDOSTIGMINE BROMIDE 60 MG PO TABS
60.0000 mg | ORAL_TABLET | Freq: Three times a day (TID) | ORAL | Status: DC
  Administered 2023-03-05 (×3): 60 mg
  Filled 2023-03-05 (×5): qty 1

## 2023-03-05 MED ORDER — POTASSIUM CHLORIDE 10 MEQ/100ML IV SOLN
10.0000 meq | INTRAVENOUS | Status: AC
  Administered 2023-03-05 (×4): 10 meq via INTRAVENOUS
  Filled 2023-03-05 (×4): qty 100

## 2023-03-05 NOTE — Evaluation (Signed)
Modified Barium Swallow Study  Patient Details  Name: [name] MRN: [id number] Date of Birth: 1955-03-29  Today's Date: 03/05/2023  Modified Barium Swallow completed.  Full report located under Chart Review in the Imaging Section.  History of Present Illness [name] is a 68 y.o. male presented to the ED with complaint of difficulty with swallowing.  Patient has a history of myasthenia gravis and follows with [facility] neurology for this issue. Per chart he is on daily prednisone and Mestinon TID. Per chart patient says he is really not able to do much more than take a few sips of water about, because food is getting stuck in his throat and difficulty swallowing his pills. He thymoma status post radiation and resection. Per [facility] neurology is recommending medical admission and IVIG therapy. Pt had FEES 06/30/22, trace penetration from diffuse pharyngeal residue and loosened secretions. No aspiration. Recommend initiate ice chips to rehydrate mucosa and start utilizing swallow mechanism. Plan for repeat FEES or MBS in next week to determine tolerance for next steps.   Clinical Impression Pt presents with mild oropharyngeal dysphagia (DIGEST Score: 1). Compared to BSE on 02/02, pt exhibits singificant improvements. Pt notes that after receving medication in evening of 02/02, he could swallow and speak better again.   Mild pharyngeal residue was observed during nectar, honey, and puree trials. Solid trial displayed prolonged chewing and multiple swallows to complete oral clearance. Pt stated that he is aware of prolonged chewing and tries to follow bites with sips. Recommend pt to start a dysphagia 3 diet with thin liquids due to concerns about fatigue during given activity. Pt noted that he has experience with MG crisis and symptoms, as well as verbalizes understanding about strategies to use in them. Factors that may increase risk of adverse event in presence of aspiration Rubye Oaks & Clearance Coots  2021): Poor general health and/or compromised immunity  Swallow Evaluation Recommendations Recommendations: PO diet PO Diet Recommendation: Dysphagia 3 (Mechanical soft);Thin liquids (Level 0) Liquid Administration via: Cup;Straw Medication Administration: Whole meds with liquid Supervision: Patient able to self-feed Swallowing strategies  : Follow solids with liquids Oral care recommendations: Oral care BID (2x/day)      Rowe Robert 03/05/2023,11:23 AM

## 2023-03-05 NOTE — ED Notes (Signed)
Pt stated arm hurt with potassium infusion, rate changed to 75 ml

## 2023-03-05 NOTE — Progress Notes (Signed)
NEUROLOGY CONSULT FOLLOW UP NOTE   Date of service: March 05, 2023 Patient Name: [name] MRN:  [id number] DOB:  1955-04-04  Brief Review of HPI: [name] is a 68 y.o. male with hx of myasthenia gravis, diabetes, PTSD, hypertension and chronic hep B infection who presents with increasing dysphagia and difficulty moving his mouth as well as bilateral shoulder weakness. Patient stated that his myasthenia gravis symptoms had been getting worse in the past few days and that since Saturday, he had been unable to swallow any food or any medications (including his Mestinon) or his own secretions.  He denied any difficulty breathing or diplopia and stated that he also had some weakness of the right shoulder.  He sees a neurologist at [facility] for his myasthenia gravis and received a PLEX treatment in December 2024.   Interval Hx/subjective  States that his breathing and muscle strength are significantly improved since receiving his first dose of IVIG.   Vitals   Vitals:   03/05/23 0625 03/05/23 0700 03/05/23 0800 03/05/23 0845  BP:  135/89 (!) 149/81 (!) 167/107  Pulse:  84 90 93  Resp:  20 18 19   Temp: 98.2 F (36.8 C)     TempSrc: Oral     SpO2:  99% 100% 100%  Weight:      Height:         Body mass index is 16.69 kg/m.  Physical Exam   HEENT: [place]/AT Lungs: Respirations unlabored Ext: No edema  Neurologic Examination   Mental Status: Patient is alert and oriented to person place time and situation, able to give clear and coherent history of present illness Speech/Language: Fluent with intact comprehension. Dysarthria now resolved Cranial Nerves:  II: PERRL.  III, IV, VI: EOMI, R >> L eye ptosis. No diplopia with sustained upgaze, but bilateral upper eyelids fatigue and droop within 30 seconds V: Sensation is intact to light touch and symmetrical to face.  VII: Smile is symmetrical. Improved bulbar weakness today VIII: Hearing intact to voice. IX, X: Phonation is  normal.  XI: Shoulder shrug 4/5 XII: Tongue is midline without fasciculations. Motor: 4-/5 strength bilateral shoulder abduction, 4/5 strength bilateral elbow flexion, elbow extension, hand grip. 4/5 strength bilateral hip flexion, knee flexion, knee extension, 4+/5 dorsiflexion Sensation: Intact to light touch bilaterally.  Coordination: FTN intact bilaterally Gait: Deferred  Medications  Current Facility-Administered Medications:    acetaminophen (TYLENOL) tablet 650 mg, 650 mg, Oral, Q6H PRN **OR** acetaminophen (TYLENOL) suppository 650 mg, 650 mg, Rectal, Q6H PRN, Synetta Fail, MD   dextrose 50 % solution 50 mL, 1 ampule, Intravenous, PRN, Janalyn Shy, Subrina, MD   enoxaparin (LOVENOX) injection 40 mg, 40 mg, Subcutaneous, Q24H, Synetta Fail, MD, 40 mg at 03/04/23 1804   hydrALAZINE (APRESOLINE) injection 10 mg, 10 mg, Intravenous, Q6H PRN, Sundil, Subrina, MD   Immune Globulin 10% (PRIVIGEN) IV infusion 20 g, 400 mg/kg, Intravenous, Q24 Hr x 5, [place], [place] E, NP, Stopped at 03/04/23 2032   lactated ringers infusion, , Intravenous, Continuous, Sundil, Subrina, MD, Last Rate: 75 mL/hr at 03/05/23 0913, Rate Verify at 03/05/23 0913   potassium chloride 10 mEq in 100 mL IVPB, 10 mEq, Intravenous, Q1 Hr x 4, [place], Kingsley P, MD, Last Rate: 75 mL/hr at 03/05/23 1038, 10 mEq at 03/05/23 1038   pyridostigmine (MESTINON) tablet 60 mg, 60 mg, Per Tube, Q8H, Synetta Fail, MD, 60 mg at 03/05/23 7846   sodium chloride flush (NS) 0.9 % injection 3  mL, 3 mL, Intravenous, Q12H, Synetta Fail, MD, 3 mL at 03/05/23 0911  Current Outpatient Medications:    Cholecalciferol (VITAMIN D) 2000 UNITS tablet, Take 2,000 Units by mouth daily., Disp: , Rfl:    diltiazem (CARDIZEM) 90 MG tablet, Take 1/2 tablet by mouth every 8 hours., Disp: 135 tablet, Rfl: 2   entecavir (BARACLUDE) 1 MG tablet, Take 1 tablet (1 mg total) by mouth daily., Disp: 30 tablet, Rfl: 6   metFORMIN  (GLUCOPHAGE) 500 MG tablet, Take 2 tablets (1,000 mg total) by mouth 2 (two) times daily with a meal., Disp: 360 tablet, Rfl: 3   Multiple Vitamin (MULTIVITAMIN WITH MINERALS) TABS tablet, Take 1 tablet by mouth daily., Disp: 100 tablet, Rfl: 0   pantoprazole (PROTONIX) 40 MG tablet, TAKE 1 TABLET BY MOUTH 2 TIMES A DAY BEFORE A MEAL, Disp: 60 tablet, Rfl: 5   predniSONE (DELTASONE) 10 MG tablet, Take 10 mg by mouth daily with breakfast., Disp: , Rfl:    pyridostigmine (MESTINON) 60 MG tablet, Take 60 mg by mouth 3 (three) times daily., Disp: , Rfl:    sitaGLIPtin (JANUVIA) 100 MG tablet, Take 1 tablet (100 mg total) by mouth daily., Disp: 90 tablet, Rfl: 3   desonide (DESONATE) 0.05 % gel, Apply 1 application topically 2 (two) times daily as needed. , Disp: , Rfl:    glucose blood (ONETOUCH ULTRA) test strip, USE TO TEST BLOOD SUGAR 3 TIMES DAILY, Disp: 100 strip, Rfl: 11   Lancets (ONETOUCH ULTRASOFT) lancets, USE AS INSTRUCTED TO CHECK BLOOD SUGARS THREE TIMES A DAY, Disp: 200 each, Rfl: 3   repaglinide (PRANDIN) 0.5 MG tablet, Take 1 tablet (0.5 mg total) by mouth daily before supper. (Patient not taking: Reported on 03/05/2023), Disp: 90 tablet, Rfl: 3  Labs and Diagnostic Imaging   CBC:  Recent Labs  Lab 03/04/23 1255 03/05/23 0449  WBC 9.5 9.7  NEUTROABS 8.6*  --   HGB 9.6* 8.7*  HCT 33.4* 29.8*  MCV 69.0* 68.7*  PLT 389 377    Basic Metabolic Panel:  Lab Results  Component Value Date   NA 134 (L) 03/05/2023   K 3.2 (L) 03/05/2023   CO2 23 03/05/2023   GLUCOSE 86 03/05/2023   BUN 13 03/05/2023   CREATININE 0.77 03/05/2023   CALCIUM 9.0 03/05/2023   GFRNONAA >60 03/05/2023   GFRAA >60 09/22/2017   Lipid Panel:  Lab Results  Component Value Date   LDLCALC 68 07/23/2008   HgbA1c:  Lab Results  Component Value Date   HGBA1C 5.9 (A) 02/12/2023   Urine Drug Screen: No results found for: "LABOPIA", "COCAINSCRNUR", "LABBENZ", "AMPHETMU", "THCU", "LABBARB"  Alcohol Level  No results found for: "ETH" INR  Lab Results  Component Value Date   INR 0.92 09/17/2017   APTT No results found for: "APTT" AED levels: No results found for: "PHENYTOIN", "ZONISAMIDE", "LAMOTRIGINE", "LEVETIRACETA"   Assessment  68 y.o. male with history of myasthenia gravis, diabetes, PTSD, hyperlipidemia and chronic hepatitis B infection who presented on Sunday morning with increasing dysphagia and weakness in bilateral shoulders, likely due to myasthenia gravis flare.  He states that he typically has difficulty swallowing and can only eat soft food or soups at baseline, but as of Saturday was unable to take anything by mouth, could not swallow his own saliva and could not take any of his medications. As patient was unable to take medications by mouth, he had not been able to get his Mestinon for the last day PTA.   -  Exam with bulbar and limb weakness, objectively about the same as yesterday, but patient states that subjectively his breathing and muscle strength are significantly improved since receiving his first dose of IVIG yesterday - Will need to use NG tube to ensure that patient can get Mestinon and other p.o. medications.  - As he has recently received Plex, we have started a 5-day course of IVIG to treat his MG exacerbation.  - Will need close monitoring of NIF throughout hospital stay.    Recommendations  - IVIG 400 mg/kg daily x 5 days. Day 2/5 will be today.  - NIF and vital capacity every 6 hours - PT/OT/SLP - Mestinon 60 mg 3 times daily while awake, may use NG tube to ensure patient is able to take this medication - NPO until swallow eval - Recommend elective intubation for respiratory compromise if VC falls below 15 to 20 mL/kg and or NIFs falls below -20cm/H2O. If poor effort and not sure, can always get ABG to assess for CO2 retention. Elective intubation for airway compromise if he has difficulty clearing secretions. Oxygen saturation should not be used to make decision  regarding intubation. - Medications that may worsen or trigger MG exacerbation: Class IA antiarrhythmics, magnesium, flouroquinolones, macrolides, aminoglycosides, penicillamine, curare, interferon alpha, botox, quinine. Use with caution: calcium channel blocker, beta blockers and statins. ______________________________________________________________________   Dessa Phi, Oreatha Fabry, MD [facility]

## 2023-03-05 NOTE — ED Notes (Signed)
Called floor, PT cleared to go upstairs

## 2023-03-05 NOTE — Progress Notes (Signed)
NIF -20, best out of 3 attempts. Patient gave good effort, no respiratory distress noted at this time.

## 2023-03-05 NOTE — Progress Notes (Signed)
NIF-30 with great effort.

## 2023-03-05 NOTE — Progress Notes (Signed)
Transition of Care [facility]) - Inpatient Brief Assessment   Patient Details  Name: [name] MRN: [id number] Date of Birth: 04/04/1955  [facility] of [facility]) CM/SW Contact:    Oletta Cohn, RN Phone Number: 03/05/2023, 2:30 PM   Clinical Narrative: RNCM met with pt at bedside regarding disposition and needs. At this time pt declines restarting home health services but may be open to it at discharge should he need it.  HE has had [facility] in the past. RNCM following for discharge needs.   Danecia Underdown J. Lucretia Roers, RN, BSN, [place] [phone]    Transition of Care Asessment: Insurance and Status: (P) Insurance coverage has been reviewed Patient has primary care physician: (P) Yes Home environment has been reviewed: (P) home with spouse Prior level of function:: (P) minimal assistance from spouse; has rollator and 3n1 Prior/Current Home Services: (P) No current home services Social Drivers of Health Review: (P) SDOH reviewed no interventions necessary Readmission risk has been reviewed: (P) Yes Transition of care needs: (P) transition of care needs identified, TOC will continue to follow

## 2023-03-05 NOTE — Care Management Obs Status (Signed)
MEDICARE OBSERVATION STATUS NOTIFICATION   Patient Details  Name: [name] MRN: [id number] Date of Birth: [birth year]   Medicare Observation Status Notification Given:  Yes    Oletta Cohn, RN 03/05/2023, 2:26 PM

## 2023-03-05 NOTE — Progress Notes (Signed)
NIF -35 performed with great effort from the patient.

## 2023-03-05 NOTE — Evaluation (Signed)
Physical Therapy Evaluation and DIscharge Patient Details Name: [name] MRN: [id number] DOB: July 05, 1955 Today's Date: 03/05/2023  History of Present Illness  Pt is a 68 y.o. M who presents 03/04/23 with complaints of myasthenia gravis flare, specifically with inability to swallow. PMH: myasthenia gravis, hepatitis B, T2DM, HTN.  Clinical Impression  Patient evaluated by Physical Therapy with no further acute PT needs identified. All education has been completed and the patient has no further questions. Since receiving meds pt is doing much better. Is mobilizing independently. Has MBS later this morning. No mobility deficits at this time.  See below for any follow-up Physical Therapy or equipment needs. PT is signing off. Thank you for this referral.         If plan is discharge home, recommend the following:     Can travel by private vehicle        Equipment Recommendations None recommended by PT  Recommendations for Other Services       Functional Status Assessment Patient has not had a recent decline in their functional status     Precautions / Restrictions Precautions Precautions: None Restrictions Weight Bearing Restrictions Per Provider Order: No      Mobility  Bed Mobility Overal bed mobility: Independent                  Transfers Overall transfer level: Independent Equipment used: None                    Ambulation/Gait Ambulation/Gait assistance: Independent Gait Distance (Feet): 150 Feet Assistive device: None Gait Pattern/deviations: WFL(Within Functional Limits) Gait velocity: WFL Gait velocity interpretation: >4.37 ft/sec, indicative of normal walking speed   General Gait Details: pt ambulates safely with good pace, no AD, no LOB  Stairs            Wheelchair Mobility     Tilt Bed    Modified Rankin (Stroke Patients Only)       Balance Overall balance assessment: Independent                                            Pertinent Vitals/Pain Pain Assessment Pain Assessment: No/denies pain Faces Pain Scale: No hurt    Home Living Family/patient expects to be discharged to:: Private residence Living Arrangements: Spouse/significant other Available Help at Discharge: Family;Available 24 hours/day Type of Home: House Home Access: Stairs to enter Entrance Stairs-Rails: None Entrance Stairs-Number of Steps: 1   Home Layout: One level Home Equipment: Rollator (4 wheels);Shower seat      Prior Function Prior Level of Function : Independent/Modified Independent             Mobility Comments: Retired ADLs Comments: independent     Extremity/Trunk Assessment   Upper Extremity Assessment Upper Extremity Assessment: Overall WFL for tasks assessed (has returned to baseline since receiving dose of meds)    Lower Extremity Assessment Lower Extremity Assessment: Overall WFL for tasks assessed    Cervical / Trunk Assessment Cervical / Trunk Assessment: Normal  Communication   Communication Communication: No apparent difficulties  Cognition Arousal: Alert Behavior During Therapy: WFL for tasks assessed/performed Overall Cognitive Status: Within Functional Limits for tasks assessed  General Comments General comments (skin integrity, edema, etc.): BP 167/101 (pt has not had meds this AM, RN notified), SPO2 97% on RA, HR 89 bpm    Exercises     Assessment/Plan    PT Assessment Patient does not need any further PT services  PT Problem List         PT Treatment Interventions      PT Goals (Current goals can be found in the Care Plan section)  Acute Rehab PT Goals Patient Stated Goal: return home, be able to swallow PT Goal Formulation: All assessment and education complete, DC therapy    Frequency       Co-evaluation               AM-PAC PT "6 Clicks" Mobility  Outcome Measure Help needed  turning from your back to your side while in a flat bed without using bedrails?: None Help needed moving from lying on your back to sitting on the side of a flat bed without using bedrails?: None Help needed moving to and from a bed to a chair (including a wheelchair)?: None Help needed standing up from a chair using your arms (e.g., wheelchair or bedside chair)?: None Help needed to walk in hospital room?: None Help needed climbing 3-5 steps with a railing? : None 6 Click Score: 24    End of Session   Activity Tolerance: Patient tolerated treatment well Patient left: in bed;with call bell/phone within reach;with family/visitor present Nurse Communication: Mobility status;Other (comment) (BP) PT Visit Diagnosis: Difficulty in walking, not elsewhere classified (R26.2)    Time: 9562-1308 PT Time Calculation (min) (ACUTE ONLY): 19 min   Charges:   PT Evaluation $PT Eval Low Complexity: 1 Low   PT General Charges $$ ACUTE PT VISIT: 1 Visit         Lyanne Co, PT  Acute Rehab Services Secure chat preferred Office [phone]   Lawana Chambers Alixandrea Milleson 03/05/2023, 9:29 AM

## 2023-03-05 NOTE — Progress Notes (Signed)
OT Cancellation Note  Patient Details Name: [name] MRN: [id number] DOB: [birth year]   Cancelled Treatment:    Reason Eval/Treat Not Completed: OT screened, no needs identified, will sign off.  Evaluating PT informed OT of no significant acute rehab needs, and that patient is close to baseline with mobility and ADL completion.    Rana Hochstein D Rosalea Withrow 03/05/2023, 3:26 PM 03/05/2023  RP, OTR/L  Acute Rehabilitation Services  Office:  [phone]

## 2023-03-05 NOTE — ED Notes (Signed)
Checked patient blood sugar it was 177

## 2023-03-05 NOTE — Progress Notes (Signed)
Patient received from ED at 1835.Patient oriented to the unit.Vitals monitored and recorded.IVF running at 46ml/hr.No any pain verbalized for now.Call bell on reach.

## 2023-03-05 NOTE — Progress Notes (Signed)
  Progress Note   Patient: [name] ZOX:[id number] DOB: [birth year] DOA: 03/04/2023     0 DOS: the patient was seen and examined on 03/05/2023   Brief hospital course: [name] is a 68 y.o. male with history of myasthenia gravis, diabetes, PTSD, hyperlipidemia and chronic hepatitis B infection who presents with increasing dysphagia and weakness in bilateral shoulders, likely due to myasthenia gravis flare.  He states that he typically has difficulty swallowing and only eat soft food or soups, but as of yesterday was unable to take anything by mouth, could not swallow his own saliva and could not take any of his medications.  Patient has been admitted for myasthenia gravis and pain initiated on IVIG neurology.  Assessment and Plan: Myasthenic crisis > Known history of myasthenia gravis.  History of prior crises.  This time presenting with dysphagia and some right arm weakness which he has had before on presentation. > Is tachycardic but his currently stable from a respiratory standpoint.  Has required intubation in the past. > Neurology has seen the patient and feel he is stable for floor bed at this time. - Monitor on progressive unit overnight - Plan is for IVIG per neurology for 5 days, s/p 1 dose, mild improvement in overall weakness - NG tube per neurology -Per neurology Mestinon has been reinitiated. - Scheduled  NIF/VC - PT/OT/SLP   Hypertension - Continue home diltiazem   Diabetes - SSI   GERD - Continue home PPI   Anxiety Depression PTSD > Has had good response to transition to BuSpar per chart review, working to confirm his medication. - Resume BuSpar when confirmed   Chronic hepatitis B - Continue home Entecavir   History of thymectomy > Has some records indicating possible history of CABG based on imaging.  Chart review shows patient had partial sternotomy and thymectomy in 2003 but no history of CABG.   DVT prophylaxis:      Lovenox Code Status:               Full      Subjective: Patient seen and examined at bedside today.  Patient reports mild improvement in overall weakness.  Patient has been initiated IVIG yesterday and has received 1 dose.  Total of 5 dose per neurology.  Patient denies having any shortness of breath or trouble breathing.  Physical Exam: Vitals:   03/05/23 0700 03/05/23 0800 03/05/23 0845 03/05/23 0900  BP: 135/89 (!) 149/81 (!) 167/107 (!) 148/102  Pulse: 84 90 93 92  Resp: 20 18 19 18   Temp:      TempSrc:      SpO2: 99% 100% 100% 100%  Weight:      Height:       Constitutional: Thin appearing elderly patient in no acute distress Psych: Affect appropriate to situation.  Eyes: No scleral injection.  Drooping of the right eyelid HENT: No OP obstruction.  Head: Normocephalic.  Cardiovascular: Normal rate and regular rhythm.  Respiratory: Effort normal, non-labored breathing.  Able to count to 10 on a single breath Skin: WDI.  Data Reviewed:  There are no new results to review at this time.  Family Communication: Patient is alert and oriented  Disposition: Status is: Observation The patient remains OBS appropriate and will d/c before 2 midnights.  Planned Discharge Destination: Home with Home Health    Time spent: 35 minutes  Author: Harold Hedge, MD 03/05/2023 12:45 PM  For on call review [email or web address].

## 2023-03-05 NOTE — ED Notes (Signed)
Checked patient cbg it was 64 notified RN of blood sugar patient is resting with call bell in reach and family at bedside

## 2023-03-06 DIAGNOSIS — R1310 Dysphagia, unspecified: Secondary | ICD-10-CM | POA: Diagnosis not present

## 2023-03-06 DIAGNOSIS — E1169 Type 2 diabetes mellitus with other specified complication: Secondary | ICD-10-CM | POA: Diagnosis not present

## 2023-03-06 DIAGNOSIS — G7000 Myasthenia gravis without (acute) exacerbation: Secondary | ICD-10-CM | POA: Diagnosis not present

## 2023-03-06 DIAGNOSIS — G7001 Myasthenia gravis with (acute) exacerbation: Secondary | ICD-10-CM | POA: Diagnosis not present

## 2023-03-06 DIAGNOSIS — E1129 Type 2 diabetes mellitus with other diabetic kidney complication: Secondary | ICD-10-CM | POA: Diagnosis not present

## 2023-03-06 LAB — BASIC METABOLIC PANEL
Anion gap: 12 (ref 5–15)
BUN: 12 mg/dL (ref 8–23)
CO2: 23 mmol/L (ref 22–32)
Calcium: 8.8 mg/dL — ABNORMAL LOW (ref 8.9–10.3)
Chloride: 98 mmol/L (ref 98–111)
Creatinine, Ser: 0.83 mg/dL (ref 0.61–1.24)
GFR, Estimated: >60 mL/min (ref >60)
Glucose, Bld: 118 mg/dL — ABNORMAL HIGH (ref 70–99)
Potassium: 3.1 mmol/L — ABNORMAL LOW (ref 3.5–5.1)
Sodium: 133 mmol/L — ABNORMAL LOW (ref 135–145)

## 2023-03-06 LAB — CBC
HCT: 29.3 % — ABNORMAL LOW (ref 39.0–52.0)
Hemoglobin: 8.7 g/dL — ABNORMAL LOW (ref 13.0–17.0)
MCH: 20 pg — ABNORMAL LOW (ref 26.0–34.0)
MCHC: 29.7 g/dL — ABNORMAL LOW (ref 30.0–36.0)
MCV: 67.2 fL — ABNORMAL LOW (ref 80.0–100.0)
Platelets: 365 10*3/uL (ref 150–400)
RBC: 4.36 MIL/uL (ref 4.22–5.81)
RDW: 17.2 % — ABNORMAL HIGH (ref 11.5–15.5)
WBC: 8.5 10*3/uL (ref 4.0–10.5)
nRBC: 0 % (ref 0.0–0.2)

## 2023-03-06 LAB — GLUCOSE, CAPILLARY
Glucose-Capillary: 106 mg/dL — ABNORMAL HIGH (ref 70–99)
Glucose-Capillary: 109 mg/dL — ABNORMAL HIGH (ref 70–99)
Glucose-Capillary: 114 mg/dL — ABNORMAL HIGH (ref 70–99)
Glucose-Capillary: 118 mg/dL — ABNORMAL HIGH (ref 70–99)
Glucose-Capillary: 292 mg/dL — ABNORMAL HIGH (ref 70–99)
Glucose-Capillary: 335 mg/dL — ABNORMAL HIGH (ref 70–99)

## 2023-03-06 MED ORDER — DILTIAZEM HCL 30 MG PO TABS
30.0000 mg | ORAL_TABLET | Freq: Three times a day (TID) | ORAL | Status: DC
  Administered 2023-03-06 – 2023-03-09 (×9): 30 mg via ORAL
  Filled 2023-03-06 (×9): qty 1

## 2023-03-06 MED ORDER — ENTECAVIR 0.5 MG PO TABS
1.0000 mg | ORAL_TABLET | Freq: Every day | ORAL | Status: DC
  Administered 2023-03-07 – 2023-03-09 (×3): 1 mg via ORAL

## 2023-03-06 MED ORDER — DICLOFENAC SODIUM 1 % EX GEL
2.0000 g | Freq: Four times a day (QID) | CUTANEOUS | Status: DC
  Administered 2023-03-06 – 2023-03-09 (×9): 2 g via TOPICAL
  Filled 2023-03-06: qty 100

## 2023-03-06 MED ORDER — POTASSIUM CHLORIDE 20 MEQ PO PACK
40.0000 meq | PACK | Freq: Once | ORAL | Status: AC
  Administered 2023-03-06: 40 meq via ORAL
  Filled 2023-03-06: qty 2

## 2023-03-06 MED ORDER — PANTOPRAZOLE SODIUM 40 MG PO TBEC
40.0000 mg | DELAYED_RELEASE_TABLET | Freq: Every day | ORAL | Status: DC
  Administered 2023-03-06 – 2023-03-09 (×4): 40 mg via ORAL
  Filled 2023-03-06 (×4): qty 1

## 2023-03-06 MED ORDER — PREDNISONE 5 MG PO TABS
10.0000 mg | ORAL_TABLET | Freq: Every day | ORAL | Status: DC
  Administered 2023-03-06 – 2023-03-09 (×4): 10 mg via ORAL
  Filled 2023-03-06 (×4): qty 2

## 2023-03-06 MED ORDER — CYCLOBENZAPRINE HCL 5 MG PO TABS
7.5000 mg | ORAL_TABLET | ORAL | Status: AC
  Administered 2023-03-06: 7.5 mg via ORAL
  Filled 2023-03-06: qty 2

## 2023-03-06 MED ORDER — ENSURE ENLIVE PO LIQD
237.0000 mL | Freq: Two times a day (BID) | ORAL | Status: DC
  Administered 2023-03-06 – 2023-03-09 (×7): 237 mL via ORAL

## 2023-03-06 MED ORDER — CYCLOBENZAPRINE HCL 5 MG PO TABS
5.0000 mg | ORAL_TABLET | Freq: Once | ORAL | Status: DC
Start: 2023-03-06 — End: 2023-03-06

## 2023-03-06 NOTE — Progress Notes (Signed)
 NEUROLOGY CONSULT FOLLOW UP NOTE   Date of service: March 06, 2023 Patient Name: [name] MRN:  [id number] DOB:  Feb 15, 1955  Interval Hx/subjective  The patient feels that he is continuing to improve. Swallowing significantly improved. Right eye still ptotic.   Vitals   Vitals:   03/05/23 2000 03/06/23 0000 03/06/23 0543 03/06/23 0700  BP: (!) 150/96 (!) 149/92 (!) 143/104 (!) 143/99  Pulse: 97 94 (!) 112 (!) 101  Resp: 19 14 17  (!) 24  Temp:   98.6 F (37 C) 98.6 F (37 C)  TempSrc:   Oral Oral  SpO2: 99% 100% 98% 98%  Weight:      Height:         Body mass index is 16.69 kg/m.  Physical Exam   HEENT: [place]/AT Lungs: Respirations unlabored Ext: No edema   Neurologic Examination    Mental Status: Patient is alert and oriented. Speech is fluent with intact comprehension. No dysarthria.  Cranial Nerves:  II: PERRL.  III, IV, VI: EOMI without nystagmus. Right ptosis is noted. Can sustain upgaze for 30 seconds with only slightly worsened right ptosis during this period.  V: Sensation is intact to light touch and symmetrical to face.  VII: Smile is symmetrical.  VIII: Hearing intact to voice. IX, X: Phonation is normal.  XI: Shoulder shrug 4/5 XII: Tongue is midline without fasciculations. Motor: 4+/5 strength bilateral shoulder abduction, 4/5 strength bilateral elbow flexion, elbow extension, hand grip. 4+/5 strength bilateral hip flexion, knee flexion, knee extension and ankle dorsiflexion Sensation: Intact to light touch bilaterally.  Coordination: FTN intact bilaterally Gait: Deferred  Medications  Current Facility-Administered Medications:    acetaminophen  (TYLENOL ) tablet 650 mg, 650 mg, Oral, Q6H PRN, 650 mg at 03/06/23 0002 **OR** acetaminophen  (TYLENOL ) suppository 650 mg, 650 mg, Rectal, Q6H PRN, Melvin, Alexander B, MD   dextrose  50 % solution 50 mL, 1 ampule, Intravenous, PRN, Sundil, Subrina, MD   diclofenac  Sodium (VOLTAREN ) 1 % topical gel 2 g, 2  g, Topical, QID, Sundil, Subrina, MD, 2 g at 03/06/23 0809   diltiazem  (CARDIZEM ) tablet 30 mg, 30 mg, Oral, Q8H, Ghimire, Donalda HERO, MD   enoxaparin  (LOVENOX ) injection 40 mg, 40 mg, Subcutaneous, Q24H, Melvin, Alexander B, MD, 40 mg at 03/05/23 1635   entecavir  (BARACLUDE ) tablet 1 mg, 1 mg, Oral, Daily, Ghimire, Donalda HERO, MD   feeding supplement (ENSURE ENLIVE / ENSURE PLUS) liquid 237 mL, 237 mL, Oral, BID BM, [place], Ellouise SQUIBB, MD, 237 mL at 03/06/23 0809   hydrALAZINE  (APRESOLINE ) injection 10 mg, 10 mg, Intravenous, Q6H PRN, Sundil, Subrina, MD   Immune Globulin  10% (PRIVIGEN ) IV infusion 20 g, 400 mg/kg, Intravenous, Q24 Hr x 5, de Clint Kill, [place] E, NP, Last Rate: 30 mL/hr at 03/06/23 0518, Infusion Verify at 03/06/23 0518   predniSONE  (DELTASONE ) tablet 10 mg, 10 mg, Oral, Q breakfast, Ghimire, Shanker M, MD, 10 mg at 03/06/23 0809   pyridostigmine  (MESTINON ) tablet 60 mg, 60 mg, Oral, Q8H, Melvin, Alexander B, MD, 60 mg at 03/06/23 0544   sodium chloride  flush (NS) 0.9 % injection 3 mL, 3 mL, Intravenous, Q12H, Seena Marsa NOVAK, MD, 3 mL at 03/06/23 0840  Labs and Diagnostic Imaging   CBC:  Recent Labs  Lab 03/04/23 1255 03/05/23 0449 03/06/23 0741  WBC 9.5 9.7 8.5  NEUTROABS 8.6*  --   --   HGB 9.6* 8.7* 8.7*  HCT 33.4* 29.8* 29.3*  MCV 69.0* 68.7* 67.2*  PLT 389 377 365  Basic Metabolic Panel:  Lab Results  Component Value Date   NA 133 (L) 03/06/2023   K 3.1 (L) 03/06/2023   CO2 23 03/06/2023   GLUCOSE 118 (H) 03/06/2023   BUN 12 03/06/2023   CREATININE 0.83 03/06/2023   CALCIUM  8.8 (L) 03/06/2023   GFRNONAA >60 03/06/2023   GFRAA >60 09/22/2017   Lipid Panel:  Lab Results  Component Value Date   LDLCALC 68 07/23/2008   HgbA1c:  Lab Results  Component Value Date   HGBA1C 5.9 (A) 02/12/2023   Urine Drug Screen: No results found for: LABOPIA, COCAINSCRNUR, LABBENZ, AMPHETMU, THCU, LABBARB  Alcohol  Level No results found for:  [facility] INR  Lab Results  Component Value Date   INR 0.92 09/17/2017     Assessment  68 y.o. male with history of myasthenia gravis, diabetes, PTSD, hyperlipidemia and chronic hepatitis B infection who presented on Sunday morning with increasing dysphagia and weakness in bilateral shoulders, likely due to myasthenia gravis flare.  He states that he typically has difficulty swallowing and can only eat soft food or soups at baseline, but as of Saturday was unable to take anything by mouth, could not swallow his own saliva and could not take any of his medications. As patient was unable to take medications by mouth, he had not been able to get his Mestinon  for the last day PTA.   - Exam is significantly improved today. Still with right ptosis.  - As he has recently received PLEX, we have started a 5-day course of IVIG to treat his MG exacerbation.  - Will need close monitoring of NIF throughout hospital stay.    Recommendations  -- IVIG 400 mg/kg daily x 5 days. Day 3/5 will be today.  - NIF and vital capacity every 6 hours - PT/OT/SLP - Mestinon  60 mg 3 times daily while awake, may use NG tube to ensure patient is able to take this medication - NPO until swallow eval - Recommend elective intubation for respiratory compromise if VC falls below 15 to 20 mL/kg and or NIFs falls below -20cm/H2O. If poor effort and not sure, can always get ABG to assess for CO2 retention. Elective intubation for airway compromise if he has difficulty clearing secretions. Oxygen  saturation should not be used to make decision regarding intubation. - Medications that may worsen or trigger MG exacerbation: Class IA antiarrhythmics, magnesium , flouroquinolones, macrolides, aminoglycosides, penicillamine, curare, interferon alpha, botox, quinine. Use with caution: calcium  channel blocker, beta blockers and statins. ______________________________________________________________________   Bonney SHARK, Shamaya Kauer, MD [facility]

## 2023-03-06 NOTE — Progress Notes (Signed)
Patient asleep at this time. Unable to perform NIF.

## 2023-03-06 NOTE — Progress Notes (Signed)
 PROGRESS NOTE        PATIENT DETAILS Name: [name] Age: 68 y.o. Sex: male Date of Birth: [birth year] Admit Date: 03/04/2023 Admitting Physician Ellouise SHAUNNA Ra, MD ERE:Gnmijw, [name] [name], MD  Brief Summary: Patient is a 68 y.o.  male with a history of myasthenia gravis, HTN, DM-2-who presented with worsening dysphagia/facial weakness/bilateral shoulder weakness-he was thought to have myasthenia gravis flare-admitted to [facility]-and started on IVIG.  Significant events: 2/2>> admit to [facility]-dysphagia/facial weakness-NG tube placed for oral medications. 2/4>> NGT removed-tolerating diet/oral medications.  Significant studies: 2/2>> CXR: No pneumonia  Significant microbiology data: None  Procedures: None  Consults: Neurology  Subjective: Lying comfortably in bed-denies any chest pain or shortness of breath.  Feels much better-tolerating diet without any issues-no issues with swallowing.  Asking NG tube to be removed.  Objective: Vitals: Blood pressure (!) 143/99, pulse (!) 101, temperature 98.6 F (37 C), temperature source Oral, resp. rate (!) 24, height 5' 5 (1.651 m), weight 45.5 kg, SpO2 98%.   Exam: Gen Exam:Alert awake-not in any distress HEENT:atraumatic, normocephalic Chest: B/L clear to auscultation anteriorly CVS:S1S2 regular Abdomen:soft non tender, non distended Extremities:no edema Neurology: Non focal Skin: no rash  Pertinent Labs/Radiology:    Latest Ref Rng & Units 03/06/2023    7:41 AM 03/05/2023    4:49 AM 03/04/2023   12:55 PM  CBC  WBC 4.0 - 10.5 K/uL 8.5  9.7  9.5   Hemoglobin 13.0 - 17.0 g/dL 8.7  8.7  9.6   Hematocrit 39.0 - 52.0 % 29.3  29.8  33.4   Platelets 150 - 400 K/uL 365  377  389     Lab Results  Component Value Date   NA 133 (L) 03/06/2023   K 3.1 (L) 03/06/2023   CL 98 03/06/2023   CO2 23 03/06/2023      Assessment/Plan: Myasthenia Gravis flare Clinically:-Dysphagia/facial weakness/bilateral  shoulder weakness has resolved IVIG x 5 days total plan Continue Mestinon  Resume prednisone  Monitor NIF/VF Mobilize with PT/OT Remove NG today  Microcytic anemia Close to usual baseline Chronic issue-further workup deferred to the outpatient setting-no active bleeding noted.  HTN BP slowly creeping up Resume diltiazem   DM-2 (A1c 5.9 on 1/13) CBG stable on SSI Resume oral hypoglycemics on discharge  Chronic hepatitis B Resume Entecavir   Malignant thymoma-s/p radiation/resection 2003  Underweight: Estimated body mass index is 16.69 kg/m as calculated from the following:   Height as of this encounter: 5' 5 (1.651 m).   Weight as of this encounter: 45.5 kg.   Code status:   Code Status: Full Code   DVT Prophylaxis: enoxaparin  (LOVENOX ) injection 40 mg Start: 03/04/23 1600   Family Communication: None at bedside   Disposition Plan: Status is: Inpatient Remains inpatient appropriate because: Severity of illness   Planned Discharge Destination:Home health   Diet: Diet Order             DIET DYS 3 Fluid consistency: Thin  Diet effective now                     Antimicrobial agents: Anti-infectives (From admission, onward)    None        MEDICATIONS: Scheduled Meds:  diclofenac  Sodium  2 g Topical QID   enoxaparin  (LOVENOX ) injection  40 mg Subcutaneous Q24H   feeding supplement  237 mL Oral BID BM  predniSONE   10 mg Oral Q breakfast   pyridostigmine   60 mg Oral Q8H   sodium chloride  flush  3 mL Intravenous Q12H   Continuous Infusions:  Immune Globulin  10% 30 mL/hr at 03/06/23 0518   PRN Meds:.acetaminophen  **OR** acetaminophen , dextrose , hydrALAZINE    I have personally reviewed following labs and imaging studies  LABORATORY DATA: CBC: Recent Labs  Lab 03/04/23 1255 03/05/23 0449 03/06/23 0741  WBC 9.5 9.7 8.5  NEUTROABS 8.6*  --   --   HGB 9.6* 8.7* 8.7*  HCT 33.4* 29.8* 29.3*  MCV 69.0* 68.7* 67.2*  PLT 389 377 365     Basic Metabolic Panel: Recent Labs  Lab 03/04/23 1255 03/05/23 0449 03/06/23 0741  NA 135 134* 133*  K 3.8 3.2* 3.1*  CL 96* 98 98  CO2 28 23 23   GLUCOSE 141* 86 118*  BUN 8 13 12   CREATININE 1.22 0.77 0.83  CALCIUM  9.3 9.0 8.8*    GFR: Estimated Creatinine Clearance: 56.3 mL/min (by C-G formula based on SCr of 0.83 mg/dL).  Liver Function Tests: Recent Labs  Lab 03/05/23 0449  AST 19  ALT 11  ALKPHOS 35*  BILITOT 0.6  PROT 7.2  ALBUMIN 3.8   No results for input(s): LIPASE, AMYLASE in the last 168 hours. No results for input(s): AMMONIA in the last 168 hours.  Coagulation Profile: No results for input(s): INR, PROTIME in the last 168 hours.  Cardiac Enzymes: No results for input(s): CKTOTAL, CKMB, CKMBINDEX, TROPONINI in the last 168 hours.  BNP (last 3 results) No results for input(s): PROBNP in the last 8760 hours.  Lipid Profile: No results for input(s): CHOL, HDL, LDLCALC, TRIG, CHOLHDL, LDLDIRECT in the last 72 hours.  Thyroid  Function Tests: No results for input(s): TSH, T4TOTAL, FREET4, T3FREE, THYROIDAB in the last 72 hours.  Anemia Panel: No results for input(s): VITAMINB12, FOLATE, FERRITIN, TIBC, IRON, RETICCTPCT in the last 72 hours.  Urine analysis:    Component Value Date/Time   COLORURINE YELLOW 06/24/2022 1106   APPEARANCEUR CLEAR 06/24/2022 1106   LABSPEC 1.014 06/24/2022 1106   PHURINE 6.0 06/24/2022 1106   GLUCOSEU >=500 (A) 06/24/2022 1106   GLUCOSEU NEGATIVE 12/25/2016 1056   HGBUR NEGATIVE 06/24/2022 1106   BILIRUBINUR NEGATIVE 06/24/2022 1106   KETONESUR 20 (A) 06/24/2022 1106   PROTEINUR 30 (A) 06/24/2022 1106   UROBILINOGEN 0.2 12/25/2016 1056   NITRITE NEGATIVE 06/24/2022 1106   LEUKOCYTESUR NEGATIVE 06/24/2022 1106    Sepsis Labs: Lactic Acid, Venous No results found for: LATICACIDVEN  MICROBIOLOGY: No results found for this or any previous visit (from  the past 240 hours).  RADIOLOGY STUDIES/RESULTS: DG Abd Portable 1V Result Date: 03/04/2023 CLINICAL DATA:  Nasogastric tube present EXAM: PORTABLE ABDOMEN - 1 VIEW COMPARISON:  06/23/2022 FINDINGS: Limited field of view for tube placement verification. An enteric tube is present with tip projecting over the left upper quadrant consistent with location in the upper stomach. Image technique limits evaluation of lung bases and soft tissue components. IMPRESSION: Enteric tube tip projects over the left upper quadrant consistent with location in the upper stomach. Electronically Signed   By: Elsie Gravely M.D.   On: 03/04/2023 19:16   DG Chest 2 View Result Date: 03/04/2023 CLINICAL DATA:  Dysphasia. EXAM: CHEST - 2 VIEW COMPARISON:  07/18/2022 FINDINGS: Sternotomy wires and mediastinal surgical clips are again noted. The heart size appears normal. There is no pleural fluid, interstitial edema or consolidative change. No signs of pneumothorax. IMPRESSION: 1. No acute  cardiopulmonary disease. 2. Previous CABG. Electronically Signed   By: Waddell Calk M.D.   On: 03/04/2023 14:12     LOS: 1 day   Donalda Applebaum, MD  [facility]    To contact the attending provider between 7A-7P or the covering provider during after hours 7P-7A, please log into the web site [email or web address] and access using universal [place] password for that web site. If you do not have the password, please call the hospital operator.  03/06/2023, 9:43 AM

## 2023-03-06 NOTE — Progress Notes (Signed)
NIF -25 with good effort. Best of 3 attempts.

## 2023-03-06 NOTE — Progress Notes (Signed)
 Speech Language Pathology Treatment: Dysphagia  Patient Details Name: [name] MRN: [id number] DOB: 05/22/1955 Today's Date: 03/06/2023 Time: 8665-8657 SLP Time Calculation (min) (ACUTE ONLY): 8 min  Assessment / Plan / Recommendation Clinical Impression  Pt stated he is presently having difficulty swallowing and unable to eat his lunch and feels this is due to not yet having his Mestonin dose this afternoon (scheduled at 2:00). After having meds this morning he reported he was able to eat okay. He did take one small sip water  with several sub swallows noted and reports that it is coming back into his mouth. Pt's efficiency with swallow appears tied to timing of his meds and pt has good awareness of when he is able or unable to eat. His NGT is out and it is hopeful he will be able to take his pill with water  and eat lunch. ST will continue to follow.     HPI HPI: [name] is a 68 y.o. male presented to the ED with complaint of difficulty with swallowing.  Patient has a history of myasthenia gravis and follows with [facility] neurology for this issue. Per chart he is on daily prednisone  and Mestinon  TID. Per chart patient says he is really not able to do much more than take a few sips of water  about, because food is getting stuck in his throat and difficulty swallowing his pills. He thymoma status post radiation and resection. Per [facility] neurology is recommending medical admission and IVIG therapy. Pt had FEES 06/30/22, trace penetration from diffuse pharyngeal residue and loosened secretions. No aspiration. Recommend initiate ice chips to rehydrate mucosa and start utilizing swallow mechanism. Plan for repeat FEES or MBS in next week to determine tolerance for next steps.      SLP Plan  Continue with current plan of care      Recommendations for follow up therapy are one component of a multi-disciplinary discharge planning process, led by the attending physician.  Recommendations may be  updated based on patient status, additional functional criteria and insurance authorization.    Recommendations  Diet recommendations: Dysphagia 3 (mechanical soft);Thin liquid Liquids provided via: Straw;Cup Medication Administration: Whole meds with liquid Supervision: Patient able to self feed Compensations: Slow rate;Small sips/bites Postural Changes and/or Swallow Maneuvers: Seated upright 90 degrees                  Oral care BID   None Dysphagia, unspecified (R13.10)     Continue with current plan of care     [name]  03/06/2023, 2:00 PM

## 2023-03-06 NOTE — Progress Notes (Addendum)
Pt achieved at NIF of -25 and a VC of 1.01 with great pt effort on all attempts. MD notified.

## 2023-03-06 NOTE — Inpatient Diabetes Management (Signed)
Inpatient Diabetes Program Recommendations  AACE/ADA: New Consensus Statement on Inpatient Glycemic Control (2015)  Target Ranges:  Prepandial:   less than 140 mg/dL      Peak postprandial:   less than 180 mg/dL (1-2 hours)      Critically ill patients:  140 - 180 mg/dL   Lab Results  Component Value Date   GLUCAP 335 (H) 03/06/2023   HGBA1C 5.9 (A) 02/12/2023    Review of Glycemic Control  Latest Reference Range & Units 03/05/23 23:27 03/05/23 23:56 03/06/23 03:53 03/06/23 07:32 03/06/23 11:04  Glucose-Capillary 70 - 99 mg/dL 85 93 161 (H) 096 (H) 045 (H)  (H): Data is abnormally high Diabetes history: Type 2 DM Outpatient Diabetes medications: none Current orders for Inpatient glycemic control: None Prednisone 10 mg QD  Inpatient Diabetes Program Recommendations:    Consider adding Novolog 0-6 units Q4H and changing supplement if appropriate.   Thanks, Lujean Rave, MSN, RNC-OB Diabetes Coordinator [phone] (8a-5p)

## 2023-03-07 DIAGNOSIS — G7000 Myasthenia gravis without (acute) exacerbation: Secondary | ICD-10-CM | POA: Diagnosis not present

## 2023-03-07 DIAGNOSIS — R1310 Dysphagia, unspecified: Secondary | ICD-10-CM | POA: Diagnosis not present

## 2023-03-07 LAB — GLUCOSE, CAPILLARY
Glucose-Capillary: 119 mg/dL — ABNORMAL HIGH (ref 70–99)
Glucose-Capillary: 134 mg/dL — ABNORMAL HIGH (ref 70–99)
Glucose-Capillary: 236 mg/dL — ABNORMAL HIGH (ref 70–99)
Glucose-Capillary: 168 mg/dL — ABNORMAL HIGH (ref 70–99)
Glucose-Capillary: 102 mg/dL — ABNORMAL HIGH (ref 70–99)

## 2023-03-07 MED ORDER — LINAGLIPTIN 5 MG PO TABS
5.0000 mg | ORAL_TABLET | Freq: Every day | ORAL | Status: DC
  Administered 2023-03-07 – 2023-03-09 (×3): 5 mg via ORAL
  Filled 2023-03-07 (×3): qty 1

## 2023-03-07 MED ORDER — ENOXAPARIN SODIUM 30 MG/0.3ML IJ SOSY
30.0000 mg | PREFILLED_SYRINGE | INTRAMUSCULAR | Status: DC
  Filled 2023-03-07: qty 0.3

## 2023-03-07 MED ORDER — INSULIN ASPART 100 UNIT/ML IJ SOLN
0.0000 [IU] | Freq: Three times a day (TID) | INTRAMUSCULAR | Status: DC
  Administered 2023-03-07: 3 [IU] via SUBCUTANEOUS
  Administered 2023-03-07: 1 [IU] via SUBCUTANEOUS
  Administered 2023-03-07: 2 [IU] via SUBCUTANEOUS
  Administered 2023-03-08: 7 [IU] via SUBCUTANEOUS
  Administered 2023-03-08 – 2023-03-09 (×2): 1 [IU] via SUBCUTANEOUS

## 2023-03-07 MED ORDER — ADULT MULTIVITAMIN W/MINERALS CH
1.0000 | ORAL_TABLET | Freq: Every day | ORAL | Status: DC
  Filled 2023-03-07 (×2): qty 1

## 2023-03-07 NOTE — Discharge Instructions (Signed)

## 2023-03-07 NOTE — Progress Notes (Signed)
 PROGRESS NOTE        PATIENT DETAILS Name: [name] Age: 68 y.o. Sex: male Date of Birth: 1955-06-28 Admit Date: 03/04/2023 Admitting Physician Ellouise SHAUNNA Ra, MD ERE:Gnmijw, [name] [name], MD  Brief Summary: Patient is a 68 y.o.  male with a history of myasthenia gravis, HTN, DM-2-who presented with worsening dysphagia/facial weakness/bilateral shoulder weakness-he was thought to have myasthenia gravis flare-admitted to [facility]-and started on IVIG.  Significant events: 2/2>> admit to [facility]-dysphagia/facial weakness-NG tube placed for oral medications. 2/4>> NGT removed-tolerating diet/oral medications.  Significant studies: 2/2>> CXR: No pneumonia  Significant microbiology data: None  Procedures: None  Consults: Neurology  Subjective: No complaints-denies any difficulty swallowing.  Objective: Vitals: Blood pressure 120/88, pulse 100, temperature 98.8 F (37.1 C), temperature source Oral, resp. rate 18, height 5' 5 (1.651 m), weight 45.5 kg, SpO2 96%.   Exam: Gen Exam:Alert awake-not in any distress HEENT:atraumatic, normocephalic Chest: B/L clear to auscultation anteriorly CVS:S1S2 regular Abdomen:soft non tender, non distended Extremities:no edema Neurology: Non focal Skin: no rash  Pertinent Labs/Radiology:    Latest Ref Rng & Units 03/06/2023    7:41 AM 03/05/2023    4:49 AM 03/04/2023   12:55 PM  CBC  WBC 4.0 - 10.5 K/uL 8.5  9.7  9.5   Hemoglobin 13.0 - 17.0 g/dL 8.7  8.7  9.6   Hematocrit 39.0 - 52.0 % 29.3  29.8  33.4   Platelets 150 - 400 K/uL 365  377  389     Lab Results  Component Value Date   NA 133 (L) 03/06/2023   K 3.1 (L) 03/06/2023   CL 98 03/06/2023   CO2 23 03/06/2023      Assessment/Plan: Myasthenia Gravis flare Dysphagia/facial weakness/bilateral shoulder weakness has resolved IVIG x 5 days total plan Continue Mestinon  Resume prednisone  Monitor NIF/VF Mobilize with PT/OT NGT removed on 2/4-tolerating  diet well without any major issues.  Microcytic anemia Close to usual baseline Chronic issue-further workup deferred to the outpatient setting-no active bleeding noted.  HTN BP stable-continue diltiazem .  DM-2 (A1c 5.9 on 1/13) CBG stable on SSI Resume oral hypoglycemics on discharge  Chronic hepatitis B Resume Entecavir   Malignant thymoma-s/p radiation/resection 2003  Underweight: Estimated body mass index is 16.69 kg/m as calculated from the following:   Height as of this encounter: 5' 5 (1.651 m).   Weight as of this encounter: 45.5 kg.   Code status:   Code Status: Full Code   DVT Prophylaxis: enoxaparin  (LOVENOX ) injection 30 mg Start: 03/07/23 1600   Family Communication: Son at bedside   Disposition Plan: Status is: Inpatient Remains inpatient appropriate because: Severity of illness   Planned Discharge Destination:Home health   Diet: Diet Order             Diet regular Room service appropriate? Yes with Assist; Fluid consistency: Thin  Diet effective now                     Antimicrobial agents: Anti-infectives (From admission, onward)    Start     Dose/Rate Route Frequency Ordered Stop   03/06/23 1045  Entecavir  (Baraclude ) tablet 1 mg - patient's own supply (at bedside)        1 mg Oral Daily 03/06/23 0948          MEDICATIONS: Scheduled Meds:  diclofenac  Sodium  2 g  Topical QID   diltiazem   30 mg Oral Q8H   enoxaparin  (LOVENOX ) injection  30 mg Subcutaneous Q24H   entecavir   1 mg Oral Daily   feeding supplement  237 mL Oral BID BM   insulin  aspart  0-9 Units Subcutaneous TID WC   linagliptin   5 mg Oral Daily   multivitamin with minerals  1 tablet Oral Daily   pantoprazole   40 mg Oral Daily   predniSONE   10 mg Oral Q breakfast   pyridostigmine   60 mg Oral Q8H   sodium chloride  flush  3 mL Intravenous Q12H   Continuous Infusions:  Immune Globulin  10% 109 mL/hr at 03/06/23 1921   PRN Meds:.acetaminophen  **OR** acetaminophen ,  dextrose , hydrALAZINE    I have personally reviewed following labs and imaging studies  LABORATORY DATA: CBC: Recent Labs  Lab 03/04/23 1255 03/05/23 0449 03/06/23 0741  WBC 9.5 9.7 8.5  NEUTROABS 8.6*  --   --   HGB 9.6* 8.7* 8.7*  HCT 33.4* 29.8* 29.3*  MCV 69.0* 68.7* 67.2*  PLT 389 377 365    Basic Metabolic Panel: Recent Labs  Lab 03/04/23 1255 03/05/23 0449 03/06/23 0741  NA 135 134* 133*  K 3.8 3.2* 3.1*  CL 96* 98 98  CO2 28 23 23   GLUCOSE 141* 86 118*  BUN 8 13 12   CREATININE 1.22 0.77 0.83  CALCIUM  9.3 9.0 8.8*    GFR: Estimated Creatinine Clearance: 56.3 mL/min (by C-G formula based on SCr of 0.83 mg/dL).  Liver Function Tests: Recent Labs  Lab 03/05/23 0449  AST 19  ALT 11  ALKPHOS 35*  BILITOT 0.6  PROT 7.2  ALBUMIN 3.8   No results for input(s): LIPASE, AMYLASE in the last 168 hours. No results for input(s): AMMONIA in the last 168 hours.  Coagulation Profile: No results for input(s): INR, PROTIME in the last 168 hours.  Cardiac Enzymes: No results for input(s): CKTOTAL, CKMB, CKMBINDEX, TROPONINI in the last 168 hours.  BNP (last 3 results) No results for input(s): PROBNP in the last 8760 hours.  Lipid Profile: No results for input(s): CHOL, HDL, LDLCALC, TRIG, CHOLHDL, LDLDIRECT in the last 72 hours.  Thyroid  Function Tests: No results for input(s): TSH, T4TOTAL, FREET4, T3FREE, THYROIDAB in the last 72 hours.  Anemia Panel: No results for input(s): VITAMINB12, FOLATE, FERRITIN, TIBC, IRON, RETICCTPCT in the last 72 hours.  Urine analysis:    Component Value Date/Time   COLORURINE YELLOW 06/24/2022 1106   APPEARANCEUR CLEAR 06/24/2022 1106   LABSPEC 1.014 06/24/2022 1106   PHURINE 6.0 06/24/2022 1106   GLUCOSEU >=500 (A) 06/24/2022 1106   GLUCOSEU NEGATIVE 12/25/2016 1056   HGBUR NEGATIVE 06/24/2022 1106   BILIRUBINUR NEGATIVE 06/24/2022 1106   KETONESUR 20 (A)  06/24/2022 1106   PROTEINUR 30 (A) 06/24/2022 1106   UROBILINOGEN 0.2 12/25/2016 1056   NITRITE NEGATIVE 06/24/2022 1106   LEUKOCYTESUR NEGATIVE 06/24/2022 1106    Sepsis Labs: Lactic Acid, Venous No results found for: LATICACIDVEN  MICROBIOLOGY: No results found for this or any previous visit (from the past 240 hours).  RADIOLOGY STUDIES/RESULTS: No results found.    LOS: 2 days   Donalda Applebaum, MD  [facility]    To contact the attending provider between 7A-7P or the covering provider during after hours 7P-7A, please log into the web site [email or web address] and access using universal [place] password for that web site. If you do not have the password, please call the hospital operator.  03/07/2023, 11:41 AM

## 2023-03-07 NOTE — Progress Notes (Signed)
 FVC 2.8 NIF -28

## 2023-03-07 NOTE — Progress Notes (Signed)
   03/07/23 1453  Mobility  Activity Ambulated independently in hallway  Level of Assistance Standby assist, set-up cues, supervision of patient - no hands on  Assistive Device None  Distance Ambulated (ft) 500 ft  Activity Response Tolerated fair  Mobility Referral Yes  Mobility visit 1 Mobility  Mobility Specialist Start Time (ACUTE ONLY) 1440  Mobility Specialist Stop Time (ACUTE ONLY) 1453  Mobility Specialist Time Calculation (min) (ACUTE ONLY) 13 min   Mobility Specialist: Progress Note  RN requested MS to visit pt. Pt agreeable to mobility session - received in chair. Pt eagar to mobilize. Pt was asymptomatic throughout session with no complaints. Returned to bed with all needs met - call bell within reach. Wife present  Virgle Boards, BS Mobility Specialist Please contact via SecureChat or  Rehab office at [phone].

## 2023-03-07 NOTE — Progress Notes (Signed)
 Pt achieved a NIF of >-40 and VC of 1.2, with great pt effort on all attempts. MD notified RT will monitor as needed.

## 2023-03-07 NOTE — Progress Notes (Signed)
 Speech Language Pathology Treatment: Dysphagia  Patient Details Name: [name] MRN: [id number] DOB: 06/25/1955 Today's Date: 03/07/2023 Time: 9091-9083 SLP Time Calculation (min) (ACUTE ONLY): 8 min  Assessment / Plan / Recommendation Clinical Impression  Pt exhibiting significant improvement from yesterday. He was alert, speech clear and had just completed 100% of breakfast meal and stated his swallowing was good and he had no difficulty after receiving his meds this am at 6.  Observed with solid texture and thin liquids via straw without s/s aspiration, no reports of globus sensation and pt stated po's cleared his pharynx. Pt's swallow ability is highly dependent on his medications and when he receives them and pt safe and efficient with swallowing when he has medication. Had MBS He reported MD changed so he gets every 6 hours like he does at home. Will upgrade consistency to regular and no further ST needed at this time.    HPI HPI: [name] is a 68 y.o. male presented to the ED with complaint of difficulty with swallowing.  Patient has a history of myasthenia gravis and follows with [facility] neurology for this issue. Per chart he is on daily prednisone  and Mestinon  TID. Per chart patient says he is really not able to do much more than take a few sips of water  about, because food is getting stuck in his throat and difficulty swallowing his pills. He thymoma status post radiation and resection. Per [facility] neurology is recommending medical admission and IVIG therapy. Pt had FEES 06/30/22, trace penetration from diffuse pharyngeal residue and loosened secretions. No aspiration. Recommend initiate ice chips to rehydrate mucosa and start utilizing swallow mechanism. Plan for repeat FEES or MBS in next week to determine tolerance for next steps.      SLP Plan  All goals met;Discharge SLP treatment due to (comment)      Recommendations for follow up therapy are one component of a  multi-disciplinary discharge planning process, led by the attending physician.  Recommendations may be updated based on patient status, additional functional criteria and insurance authorization.    Recommendations  Diet recommendations: Regular;Thin liquid Liquids provided via: Straw;Cup Medication Administration: Whole meds with liquid Supervision: Patient able to self feed Compensations: Slow rate;Small sips/bites Postural Changes and/or Swallow Maneuvers: Seated upright 90 degrees                  Oral care BID   None Dysphagia, unspecified (R13.10)     All goals met;Discharge SLP treatment due to (comment)     [name]  03/07/2023, 9:24 AM

## 2023-03-08 DIAGNOSIS — G7000 Myasthenia gravis without (acute) exacerbation: Secondary | ICD-10-CM | POA: Diagnosis not present

## 2023-03-08 DIAGNOSIS — R1310 Dysphagia, unspecified: Secondary | ICD-10-CM | POA: Diagnosis not present

## 2023-03-08 LAB — BASIC METABOLIC PANEL
Anion gap: 9 (ref 5–15)
BUN: 18 mg/dL (ref 8–23)
CO2: 26 mmol/L (ref 22–32)
Calcium: 9 mg/dL (ref 8.9–10.3)
Chloride: 98 mmol/L (ref 98–111)
Creatinine, Ser: 0.82 mg/dL (ref 0.61–1.24)
GFR, Estimated: >60 mL/min (ref >60)
Glucose, Bld: 78 mg/dL (ref 70–99)
Potassium: 3.7 mmol/L (ref 3.5–5.1)
Sodium: 133 mmol/L — ABNORMAL LOW (ref 135–145)

## 2023-03-08 LAB — GLUCOSE, CAPILLARY
Glucose-Capillary: 308 mg/dL — ABNORMAL HIGH (ref 70–99)
Glucose-Capillary: 140 mg/dL — ABNORMAL HIGH (ref 70–99)
Glucose-Capillary: 93 mg/dL (ref 70–99)
Glucose-Capillary: 98 mg/dL (ref 70–99)

## 2023-03-08 LAB — CBC
HCT: 28.1 % — ABNORMAL LOW (ref 39.0–52.0)
Hemoglobin: 8.4 g/dL — ABNORMAL LOW (ref 13.0–17.0)
MCH: 20 pg — ABNORMAL LOW (ref 26.0–34.0)
MCHC: 29.9 g/dL — ABNORMAL LOW (ref 30.0–36.0)
MCV: 66.7 fL — ABNORMAL LOW (ref 80.0–100.0)
Platelets: 312 10*3/uL (ref 150–400)
RBC: 4.21 MIL/uL — ABNORMAL LOW (ref 4.22–5.81)
RDW: 17.8 % — ABNORMAL HIGH (ref 11.5–15.5)
WBC: 5.6 10*3/uL (ref 4.0–10.5)
nRBC: 0 % (ref 0.0–0.2)

## 2023-03-08 MED ORDER — PYRIDOSTIGMINE BROMIDE 60 MG PO TABS: 60.0000 mg | ORAL_TABLET | Freq: Three times a day (TID) | ORAL | Status: DC

## 2023-03-08 MED ORDER — PYRIDOSTIGMINE BROMIDE 60 MG PO TABS
60.0000 mg | ORAL_TABLET | Freq: Once | ORAL | Status: AC
Start: 2023-03-08 — End: 2023-03-08
  Administered 2023-03-08: 60 mg via ORAL
  Filled 2023-03-08: qty 1

## 2023-03-08 MED ORDER — PYRIDOSTIGMINE BROMIDE 60 MG PO TABS: 60.0000 mg | ORAL_TABLET | ORAL | Status: DC

## 2023-03-08 MED ORDER — PYRIDOSTIGMINE BROMIDE 60 MG PO TABS
60.0000 mg | ORAL_TABLET | Freq: Four times a day (QID) | ORAL | Status: DC
  Administered 2023-03-08 – 2023-03-09 (×3): 60 mg via ORAL
  Filled 2023-03-08 (×3): qty 1

## 2023-03-08 NOTE — TOC Initial Note (Addendum)
 Transition of Care [facility]) - Initial/Assessment Note    Patient Details  Name: [name] MRN: [id number] Date of Birth: 1955/05/02  Transition [facility] CM/SW Contact:    Jeoffrey LITTIE Moose, Student-Social Work Phone Number: 03/08/2023, 9:15 AM  Clinical Narrative:                  Pt admitted from home with spouse. No current TOC needs, please consult as needs arise.    Patient Goals and CMS Choice            Expected Discharge Plan and Services       Living arrangements for the past 2 months: Single Family Home                                      Prior Living Arrangements/Services Living arrangements for the past 2 months: Single Family Home Lives with:: Spouse                   Activities of Daily Living   ADL Screening (condition at time of admission) Independently performs ADLs?: Yes (appropriate for developmental age) Is the patient deaf or have difficulty hearing?: No Does the patient have difficulty seeing, even when wearing glasses/contacts?: No Does the patient have difficulty concentrating, remembering, or making decisions?: No  Permission Sought/Granted                  Emotional Assessment              Admission diagnosis:  Myasthenic crisis (HCC) [G70.01] Dysphagia, unspecified type [R13.10] Patient Active Problem List   Diagnosis Date Noted   Myasthenic crisis (HCC) 03/04/2023   Type 2 diabetes mellitus with diabetic microalbuminuria, without long-term current use of insulin  (HCC) 02/15/2023   Subclinical hyperthyroidism 02/15/2023   Myasthenia exacerbation (HCC) 07/07/2022   Protein-calorie malnutrition, severe 06/23/2022   Compromised airway 06/22/2022   Hepatitis B 06/17/2022   Hyponatremia 06/17/2022   Hypertension, essential, benign 01/31/2019   Myasthenia gravis (HCC) 09/17/2017   Psoriasis 12/25/2016   Pruritus 10/28/2016   Hypercalcemia 03/30/2014   Allergic rhinitis 01/02/2013   Photosensitivity  10/30/2011   Encounter for long-term (current) use of other medications 09/25/2011   Screening examination for infectious disease 09/22/2010   Phlebitis and thrombophlebitis of other sites 02/17/2010   Disorder of thyroid  10/05/2009   Hearing loss 09/06/2009   SHOULDER PAIN, RIGHT 07/07/2009   Vitamin D  deficiency 11/23/2008   Headache 11/23/2008   GERD 07/23/2008   SEBORRHEA 05/12/2008   Osteoporosis 11/25/2007   Sinus tachycardia 03/25/2007   Pruritic rash 03/25/2007   COLONIC POLYPS 03/14/2007   Anxiety disorder, unspecified 03/14/2007   Internal hemorrhoids 03/14/2007   External hemorrhoids 03/14/2007   Chronic hepatitis B virus infection (HCC) 06/29/2006   Major depressive disorder, recurrent episode (HCC) 06/29/2006   INSOMNIA 06/29/2006   PCP:  Jordan, [name], MD Pharmacy:   [facility] [id number] - [place], [place] - [address] [place] [address] Phone: [phone] Fax: [phone]  [facility] [address] [place] [address] Phone: [phone] Fax: [phone]     Social Drivers of Health (SDOH) Social History: SDOH Screenings   Food Insecurity: No Food Insecurity (03/05/2023)  Housing: High Risk (03/05/2023)  Transportation Needs: No Transportation Needs (03/05/2023)  Utilities: Not At Risk (03/05/2023)  Depression (PHQ2-9): Low Risk  (10/24/2022)  Recent Concern: Depression (PHQ2-9) - Medium Risk (09/22/2022)  Social Connections: Moderately Integrated (03/05/2023)  Tobacco Use: Low Risk  (03/04/2023)   SDOH Interventions: Housing Interventions: [facility] Provided, Inpatient TOC   Readmission Risk Interventions     No data to display

## 2023-03-08 NOTE — Progress Notes (Signed)
 NiF-20 VC 2.43L

## 2023-03-08 NOTE — Plan of Care (Signed)
  Problem: Coping: Goal: Ability to adjust to condition or change in health will improve Outcome: Progressing   Problem: Health Behavior/Discharge Planning: Goal: Ability to manage health-related needs will improve Outcome: Progressing   Problem: Nutritional: Goal: Maintenance of adequate nutrition will improve Outcome: Progressing   Problem: Skin Integrity: Goal: Risk for impaired skin integrity will decrease Outcome: Progressing

## 2023-03-08 NOTE — Progress Notes (Signed)
 VC 2.3L x 2 attempts NIF -20 x 2 attempts

## 2023-03-08 NOTE — Progress Notes (Signed)
 I have attempted to obtain VC/NIF x 2 today. Both times pt wants to wait until his MG meds take effect.  RN in room and aware.

## 2023-03-08 NOTE — Progress Notes (Addendum)
 NEUROLOGY CONSULT FOLLOW UP NOTE   Date of service: March 08, 2023 Patient Name: [name] MRN:  [id number] DOB:  1955/03/15  Interval Hx/subjective  The patient feels that he is continuing to improve. Right eye still ptotic. He states that Mestinon  is wearing off too quickly this AM.    Vitals   Vitals:   03/08/23 0500 03/08/23 0818 03/08/23 1203 03/08/23 1331  BP: (!) 136/98 127/83 (!) 143/91 130/84  Pulse: 100 (!) 108 (!) 115 (!) 109  Resp: 18 18 18 18   Temp: 97.7 F (36.5 C) 98.1 F (36.7 C) 98.2 F (36.8 C)   TempSrc: Oral Oral Oral   SpO2: 100%   97%  Weight:      Height:         Body mass index is 16.69 kg/m.  Physical Exam   HEENT: [place]/AT Lungs: Respirations unlabored Ext: No edema  Neurologic Examination   Mental Status: Patient is alert and oriented. Speech is fluent with intact comprehension. No dysarthria.  Cranial Nerves:  II: PERRL.  III, IV, VI: EOMI without nystagmus. Right ptosis is noted that is mild at beginning of exam, worsens towards end of exam.   VII: Smile is symmetrical.  VIII: Hearing intact to voice. IX, X: Phonation is normal.  XII: Tongue is midline without fasciculations. Motor: 4-/5 strength bilateral deltoids, 4/5 strength bilateral elbow flexion, elbow extension, hand grip. 4+/5 strength bilateral hip flexion, knee flexion, knee extension and ankle dorsiflexion Coordination: FTN intact bilaterally Gait: Normal gait and station. Able to rise and stand with own power.   Medications  Current Facility-Administered Medications:    acetaminophen  (TYLENOL ) tablet 650 mg, 650 mg, Oral, Q6H PRN, 650 mg at 03/06/23 0002 **OR** acetaminophen  (TYLENOL ) suppository 650 mg, 650 mg, Rectal, Q6H PRN, Melvin, Alexander B, MD   dextrose  50 % solution 50 mL, 1 ampule, Intravenous, PRN, Sundil, Subrina, MD   diclofenac  Sodium (VOLTAREN ) 1 % topical gel 2 g, 2 g, Topical, QID, Sundil, Subrina, MD, 2 g at 03/07/23 2142   diltiazem  (CARDIZEM )  tablet 30 mg, 30 mg, Oral, Q8H, Ghimire, Donalda HERO, MD, 30 mg at 03/08/23 1221   enoxaparin  (LOVENOX ) injection 30 mg, 30 mg, Subcutaneous, Q24H, Pham, Minh Q, RPH-CPP   Entecavir  (Baraclude ) tablet 1 mg - patient's own supply (at bedside), 1 mg, Oral, Daily, Ghimire, Donalda HERO, MD, 1 mg at 03/08/23 0456   feeding supplement (ENSURE ENLIVE / ENSURE PLUS) liquid 237 mL, 237 mL, Oral, BID BM, [place], Ellouise SQUIBB, MD, 237 mL at 03/08/23 9170   hydrALAZINE  (APRESOLINE ) injection 10 mg, 10 mg, Intravenous, Q6H PRN, Sundil, Subrina, MD   Immune Globulin  10% (PRIVIGEN ) IV infusion 20 g, 400 mg/kg, Intravenous, Q24 Hr x 5, de [place], Melrose E, NP, Last Rate: 109 mL/hr at 03/07/23 2048, Rate Change at 03/07/23 2048   insulin  aspart (novoLOG ) injection 0-9 Units, 0-9 Units, Subcutaneous, TID WC, Ghimire, Donalda HERO, MD, 1 Units at 03/08/23 1223   linagliptin  (TRADJENTA ) tablet 5 mg, 5 mg, Oral, Daily, Ghimire, Donalda HERO, MD, 5 mg at 03/08/23 9171   multivitamin with minerals tablet 1 tablet, 1 tablet, Oral, Daily, Ghimire, Donalda HERO, MD   pantoprazole  (PROTONIX ) EC tablet 40 mg, 40 mg, Oral, Daily, Ghimire, Shanker M, MD, 40 mg at 03/08/23 9171   predniSONE  (DELTASONE ) tablet 10 mg, 10 mg, Oral, Q breakfast, Ghimire, Donalda HERO, MD, 10 mg at 03/08/23 9388   pyridostigmine  (MESTINON ) tablet 60 mg, 60 mg, Oral, QID, Merinda Victorino, MD  sodium chloride  flush (NS) 0.9 % injection 3 mL, 3 mL, Intravenous, Q12H, Melvin, Alexander B, MD, 3 mL at 03/08/23 0830  Labs and Diagnostic Imaging   CBC:  Recent Labs  Lab 03/04/23 1255 03/05/23 0449 03/06/23 0741 03/08/23 0457  WBC 9.5   < > 8.5 5.6  NEUTROABS 8.6*  --   --   --   HGB 9.6*   < > 8.7* 8.4*  HCT 33.4*   < > 29.3* 28.1*  MCV 69.0*   < > 67.2* 66.7*  PLT 389   < > 365 312   < > = values in this interval not displayed.    Basic Metabolic Panel:  Lab Results  Component Value Date   NA 133 (L) 03/08/2023   K 3.7 03/08/2023   CO2 26 03/08/2023    GLUCOSE 78 03/08/2023   BUN 18 03/08/2023   CREATININE 0.82 03/08/2023   CALCIUM  9.0 03/08/2023   GFRNONAA >60 03/08/2023   GFRAA >60 09/22/2017   Lipid Panel:  Lab Results  Component Value Date   LDLCALC 68 07/23/2008   HgbA1c:  Lab Results  Component Value Date   HGBA1C 5.9 (A) 02/12/2023   Urine Drug Screen: No results found for: LABOPIA, COCAINSCRNUR, LABBENZ, AMPHETMU, THCU, LABBARB  Alcohol  Level No results found for: [facility] INR  Lab Results  Component Value Date   INR 0.92 09/17/2017      Assessment  68 y.o. male with history of myasthenia gravis, diabetes, PTSD, hyperlipidemia and chronic hepatitis B infection who presented on Sunday morning with increasing dysphagia and weakness in bilateral shoulders, likely due to myasthenia gravis flare.  He states that he typically has difficulty swallowing and can only eat soft food or soups at baseline, but as of Saturday was unable to take anything by mouth, could not swallow his own saliva and could not take any of his medications. As patient was unable to take medications by mouth, he had not been able to get his Mestinon  for the last day PTA.   - Exam is significantly improved today. Still with right ptosis.  - As he has recently received PLEX, we have started a 5-day course of IVIG to treat his MG exacerbation.  - Respiratory therapy assessment at 2:05 PM today: VC 2.3L x 2 attempts; NIF -20 x 2 attempts - Will need continued close monitoring of NIF throughout hospital stay.    Recommendations  -- IVIG 400 mg/kg daily x 5 days. Day 5/5 will be today.  - NIF and vital capacity every 6 hours - PT/OT/SLP - Currently on Mestinon  60 mg TID. I have changed the frequency of Mestinon  to 4 times daily based on early wearing-off of this drug as endorsed by the patient this morning.  - Medications that may worsen or trigger MG exacerbation: Class IA antiarrhythmics, magnesium , flouroquinolones, macrolides, aminoglycosides,  penicillamine, curare, interferon alpha, botox, quinine. Use with caution: calcium  channel blocker, beta blockers and statins.   ______________________________________________________________________   Bonney SHARK, Tarell Schollmeyer, MD [facility]

## 2023-03-08 NOTE — Progress Notes (Signed)
 PROGRESS NOTE        PATIENT DETAILS Name: [name] Age: 68 y.o. Sex: male Date of Birth: 18-May-1955 Admit Date: 03/04/2023 Admitting Physician Ellouise SHAUNNA Ra, MD ERE:Gnmijw, Matthew [name], MD  Brief Summary: Patient is a 68 y.o.  male with a history of myasthenia gravis, HTN, DM-2-who presented with worsening dysphagia/facial weakness/bilateral shoulder weakness-he was thought to have myasthenia gravis flare-admitted to [facility]-and started on IVIG.  Significant events: 2/2>> admit to [facility]-dysphagia/facial weakness-NG tube placed for oral medications. 2/4>> NGT removed-tolerating diet/oral medications.  Significant studies: 2/2>> CXR: No pneumonia  Significant microbiology data: None  Procedures: None  Consults: Neurology  Subjective: Claims that he had transient left eye droopiness/dysphagia around 6 AM this morning that got better with him taking Mestinon .  When I saw him around 8 AM this morning-he was easily eating breakfast without any dysphagia.  Completes his IVIG later today.  Objective: Vitals: Blood pressure 127/83, pulse (!) 108, temperature 98.1 F (36.7 C), temperature source Oral, resp. rate 18, height 5' 5 (1.651 m), weight 45.5 kg, SpO2 100%.   Exam: Gen Exam:Alert awake-not in any distress HEENT:atraumatic, normocephalic Chest: B/L clear to auscultation anteriorly CVS:S1S2 regular Abdomen:soft non tender, non distended Extremities:no edema Neurology: Non focal Skin: no rash  Pertinent Labs/Radiology:    Latest Ref Rng & Units 03/08/2023    4:57 AM 03/06/2023    7:41 AM 03/05/2023    4:49 AM  CBC  WBC 4.0 - 10.5 K/uL 5.6  8.5  9.7   Hemoglobin 13.0 - 17.0 g/dL 8.4  8.7  8.7   Hematocrit 39.0 - 52.0 % 28.1  29.3  29.8   Platelets 150 - 400 K/uL 312  365  377     Lab Results  Component Value Date   NA 133 (L) 03/08/2023   K 3.7 03/08/2023   CL 98 03/08/2023   CO2 26 03/08/2023      Assessment/Plan: Myasthenia Gravis  flare Overall improved-apparently had transient dysphagia/left eyelid droopiness earlier this morning that resolved after he took Mestinon .  Remains on IVIG x 5 days Discussed with pharmacist-will change timing of Mestinon -in order to see if we can minimize his a.m. symptoms.  Continue prednisone  Monitor NIF/VF Mobilize with PT/OT NGT removed on 2/4-tolerating diet well without any major issues. Hopefully home on 2/7  Microcytic anemia Close to usual baseline Chronic issue-further workup deferred to the outpatient setting-no active bleeding noted.  HTN BP stable-continue diltiazem .  DM-2 (A1c 5.9 on 1/13) CBG stable on SSI Resume oral hypoglycemics on discharge  Recent Labs    03/07/23 1704 03/07/23 2056 03/08/23 0817  GLUCAP 168* 102* 308*     Chronic hepatitis B Resume Entecavir   Malignant thymoma-s/p radiation/resection 2003  Underweight: Estimated body mass index is 16.69 kg/m as calculated from the following:   Height as of this encounter: 5' 5 (1.651 m).   Weight as of this encounter: 45.5 kg.   Code status:   Code Status: Full Code   DVT Prophylaxis: enoxaparin  (LOVENOX ) injection 30 mg Start: 03/07/23 1600   Family Communication: Son at bedside   Disposition Plan: Status is: Inpatient Remains inpatient appropriate because: Severity of illness   Planned Discharge Destination:Home health   Diet: Diet Order             Diet regular Room service appropriate? Yes with Assist; Fluid consistency: Thin  Diet effective now                     Antimicrobial agents: Anti-infectives (From admission, onward)    Start     Dose/Rate Route Frequency Ordered Stop   03/06/23 1045  Entecavir  (Baraclude ) tablet 1 mg - patient's own supply (at bedside)        1 mg Oral Daily 03/06/23 0948          MEDICATIONS: Scheduled Meds:  diclofenac  Sodium  2 g Topical QID   diltiazem   30 mg Oral Q8H   enoxaparin  (LOVENOX ) injection  30 mg Subcutaneous  Q24H   entecavir   1 mg Oral Daily   feeding supplement  237 mL Oral BID BM   insulin  aspart  0-9 Units Subcutaneous TID WC   linagliptin   5 mg Oral Daily   multivitamin with minerals  1 tablet Oral Daily   pantoprazole   40 mg Oral Daily   predniSONE   10 mg Oral Q breakfast   pyridostigmine   60 mg Oral Q8H   sodium chloride  flush  3 mL Intravenous Q12H   Continuous Infusions:  Immune Globulin  10% 109 mL/hr at 03/07/23 2048   PRN Meds:.acetaminophen  **OR** acetaminophen , dextrose , hydrALAZINE    I have personally reviewed following labs and imaging studies  LABORATORY DATA: CBC: Recent Labs  Lab 03/04/23 1255 03/05/23 0449 03/06/23 0741 03/08/23 0457  WBC 9.5 9.7 8.5 5.6  NEUTROABS 8.6*  --   --   --   HGB 9.6* 8.7* 8.7* 8.4*  HCT 33.4* 29.8* 29.3* 28.1*  MCV 69.0* 68.7* 67.2* 66.7*  PLT 389 377 365 312    Basic Metabolic Panel: Recent Labs  Lab 03/04/23 1255 03/05/23 0449 03/06/23 0741 03/08/23 0457  NA 135 134* 133* 133*  K 3.8 3.2* 3.1* 3.7  CL 96* 98 98 98  CO2 28 23 23 26   GLUCOSE 141* 86 118* 78  BUN 8 13 12 18   CREATININE 1.22 0.77 0.83 0.82  CALCIUM  9.3 9.0 8.8* 9.0    GFR: Estimated Creatinine Clearance: 57 mL/min (by C-G formula based on SCr of 0.82 mg/dL).  Liver Function Tests: Recent Labs  Lab 03/05/23 0449  AST 19  ALT 11  ALKPHOS 35*  BILITOT 0.6  PROT 7.2  ALBUMIN 3.8   No results for input(s): LIPASE, AMYLASE in the last 168 hours. No results for input(s): AMMONIA in the last 168 hours.  Coagulation Profile: No results for input(s): INR, PROTIME in the last 168 hours.  Cardiac Enzymes: No results for input(s): CKTOTAL, CKMB, CKMBINDEX, TROPONINI in the last 168 hours.  BNP (last 3 results) No results for input(s): PROBNP in the last 8760 hours.  Lipid Profile: No results for input(s): CHOL, HDL, LDLCALC, TRIG, CHOLHDL, LDLDIRECT in the last 72 hours.  Thyroid  Function Tests: No results for  input(s): TSH, T4TOTAL, FREET4, T3FREE, THYROIDAB in the last 72 hours.  Anemia Panel: No results for input(s): VITAMINB12, FOLATE, FERRITIN, TIBC, IRON, RETICCTPCT in the last 72 hours.  Urine analysis:    Component Value Date/Time   COLORURINE YELLOW 06/24/2022 1106   APPEARANCEUR CLEAR 06/24/2022 1106   LABSPEC 1.014 06/24/2022 1106   PHURINE 6.0 06/24/2022 1106   GLUCOSEU >=500 (A) 06/24/2022 1106   GLUCOSEU NEGATIVE 12/25/2016 1056   HGBUR NEGATIVE 06/24/2022 1106   BILIRUBINUR NEGATIVE 06/24/2022 1106   KETONESUR 20 (A) 06/24/2022 1106   PROTEINUR 30 (A) 06/24/2022 1106   UROBILINOGEN 0.2 12/25/2016 1056   NITRITE NEGATIVE 06/24/2022 1106  LEUKOCYTESUR NEGATIVE 06/24/2022 1106    Sepsis Labs: Lactic Acid, Venous No results found for: LATICACIDVEN  MICROBIOLOGY: No results found for this or any previous visit (from the past 240 hours).  RADIOLOGY STUDIES/RESULTS: No results found.    LOS: 3 days   Donalda Applebaum, MD  [facility]    To contact the attending provider between 7A-7P or the covering provider during after hours 7P-7A, please log into the web site [email or web address] and access using universal [place] password for that web site. If you do not have the password, please call the hospital operator.  03/08/2023, 10:56 AM

## 2023-03-09 DIAGNOSIS — F411 Generalized anxiety disorder: Secondary | ICD-10-CM | POA: Diagnosis not present

## 2023-03-09 DIAGNOSIS — R1310 Dysphagia, unspecified: Secondary | ICD-10-CM | POA: Diagnosis not present

## 2023-03-09 DIAGNOSIS — G7000 Myasthenia gravis without (acute) exacerbation: Secondary | ICD-10-CM | POA: Diagnosis not present

## 2023-03-09 DIAGNOSIS — G7001 Myasthenia gravis with (acute) exacerbation: Secondary | ICD-10-CM | POA: Diagnosis not present

## 2023-03-09 DIAGNOSIS — E1169 Type 2 diabetes mellitus with other specified complication: Secondary | ICD-10-CM | POA: Diagnosis not present

## 2023-03-09 LAB — GLUCOSE, CAPILLARY: Glucose-Capillary: 129 mg/dL — ABNORMAL HIGH (ref 70–99)

## 2023-03-09 MED ORDER — PYRIDOSTIGMINE BROMIDE 60 MG PO TABS: 60.0000 mg | ORAL_TABLET | Freq: Four times a day (QID) | ORAL | Status: AC

## 2023-03-09 NOTE — TOC Transition Note (Addendum)
 Transition of Care [facility]) - Discharge Note   Patient Details  Name: [name] MRN: [id number] Date of Birth: August 27, 1955  [facility] CM/SW Contact:  Roxie KANDICE Stain, RN Phone Number: 03/09/2023, 8:42 AM   Clinical Narrative:    Patient stable to discharge home.  No TOC needs at this time.   Final next level of care: Home/Self Care Barriers to Discharge: Barriers Resolved   Patient Goals and CMS Choice Patient states their goals for this hospitalization and ongoing recovery are:: return home          Discharge Placement                       Discharge Plan and Services Additional resources added to the After Visit Summary for                                       Social Drivers of Health (SDOH) Interventions SDOH Screenings   Food Insecurity: No Food Insecurity (03/05/2023)  Housing: High Risk (03/05/2023)  Transportation Needs: No Transportation Needs (03/05/2023)  Utilities: Not At Risk (03/05/2023)  Depression (PHQ2-9): Low Risk  (10/24/2022)  Recent Concern: Depression (PHQ2-9) - Medium Risk (09/22/2022)  Social Connections: Moderately Integrated (03/05/2023)  Tobacco Use: Low Risk  (03/04/2023)     Readmission Risk Interventions    03/09/2023    8:41 AM  Readmission Risk Prevention Plan  Transportation Screening Complete  PCP or Specialist Appt within 5-7 Days Complete  Home Care Screening Complete  Medication Review (RN CM) Complete

## 2023-03-09 NOTE — Discharge Summary (Signed)
 PATIENT DETAILS Name: [name] Age: 68 y.o. Sex: male Date of Birth: [birth year] MRN: [id number]. Admitting Physician: Ellouise SHAUNNA Ra, MD ERE:Gnmijw, [name] [name], MD  Admit Date: 03/04/2023 Discharge date: 03/09/2023  Recommendations for Outpatient Follow-up:  Follow up with PCP in 1-2 weeks Please obtain CMP/CBC in one week  Admitted From:  Home  Disposition: Home   Discharge Condition: good  CODE STATUS:   Code Status: Full Code   Diet recommendation:  Diet Order             Diet - low sodium heart healthy           Diet Carb Modified           Diet regular Room service appropriate? Yes with Assist; Fluid consistency: Thin  Diet effective now                    Brief Summary: Patient is a 68 y.o.  male with a history of myasthenia gravis, HTN, DM-2-who presented with worsening dysphagia/facial weakness/bilateral shoulder weakness-he was thought to have myasthenia gravis flare-admitted to [facility]-and started on IVIG.   Significant events: 2/2>> admit to [facility]-dysphagia/facial weakness-NG tube placed for oral medications. 2/4>> NGT removed-tolerating diet/oral medications.   Significant studies: 2/2>> CXR: No pneumonia   Significant microbiology data: None   Procedures: None   Consults: Neurology  Brief Hospital Course: Myasthenia Gravis flare Improved-completed IVIG x 5 days Due to currently wearing of effects of Mestinon -dosage increased to 4 times daily Remains on prednisone  Stable for discharge today-no dysphagia-tolerating diet well.  No obvious facial muscle weakness on exam this morning. Outpatient follow-up with his primary neurologist.     Microcytic anemia Close to usual baseline Chronic issue-further workup deferred to the outpatient setting-no active bleeding noted.   HTN BP stable-continue diltiazem .   DM-2 (A1c 5.9 on 1/13) CBG stable on SSI while inpatient Resume oral hypoglycemics on discharge  Chronic hepatitis B Resume  Entecavir    Malignant thymoma-s/p radiation/resection 2003   Underweight: Estimated body mass index is 16.69 kg/m as calculated from the following:   Height as of this encounter: 5' 5 (1.651 m).   Weight as of this encounter: 45.5 kg.    Discharge Diagnoses:  Active Problems:   Myasthenia gravis (HCC)   Anxiety disorder, unspecified   Major depressive disorder, recurrent episode (HCC)   GERD   Hypertension, essential, benign   Type 2 diabetes mellitus with diabetic microalbuminuria, without long-term current use of insulin  (HCC)   Subclinical hyperthyroidism   Myasthenic crisis [facility])   Discharge Instructions:  Activity:  As tolerated with Full fall precautions use walker/cane & assistance as needed   Discharge Instructions     Call MD for:  extreme fatigue   Complete by: As directed    Diet - low sodium heart healthy   Complete by: As directed    Diet Carb Modified   Complete by: As directed    Discharge instructions   Complete by: As directed    Follow with Primary MD  Jordan, [name], MD in 1-2 weeks  Follow-up with your neurologist at [facility] in the next 1-2 weeks.  Please get a complete blood count and chemistry panel checked by your Primary MD at your next visit, and again as instructed by your Primary MD.  Get Medicines reviewed and adjusted: Please take all your medications with you for your next visit with your Primary MD  Laboratory/radiological data: Please request your Primary MD to go over  all hospital tests and procedure/radiological results at the follow up, please ask your Primary MD to get all Hospital records sent to his/her office.  In some cases, they will be blood work, cultures and biopsy results pending at the time of your discharge. Please request that your primary care M.D. follows up on these results.  Also Note the following: If you experience worsening of your admission symptoms, develop shortness of breath, life threatening emergency,  suicidal or homicidal thoughts you must seek medical attention immediately by calling 911 or calling your MD immediately  if symptoms less severe.  You must read complete instructions/literature along with all the possible adverse reactions/side effects for all the Medicines you take and that have been prescribed to you. Take any new Medicines after you have completely understood and accpet all the possible adverse reactions/side effects.   Do not drive when taking Pain medications or sleeping medications (Benzodaizepines)  Do not take more than prescribed Pain, Sleep and Anxiety Medications. It is not advisable to combine anxiety,sleep and pain medications without talking with your primary care practitioner  Special Instructions: If you have smoked or chewed Tobacco  in the last 2 yrs please stop smoking, stop any regular Alcohol   and or any Recreational drug use.  Wear Seat belts while driving.  Please note: You were cared for by a hospitalist during your hospital stay. Once you are discharged, your primary care physician will handle any further medical issues. Please note that NO REFILLS for any discharge medications will be authorized once you are discharged, as it is imperative that you return to your primary care physician (or establish a relationship with a primary care physician if you do not have one) for your post hospital discharge needs so that they can reassess your need for medications and monitor your lab values.   Increase activity slowly   Complete by: As directed       Allergies as of 03/09/2023       Reactions   Azithromycin Other (See Comments)   REACTION: exac of his mg   Beta Adrenergic Blockers Other (See Comments)   REACTION: exac of mg   Calcium  Channel Blockers Other (See Comments)   Medications that may worsen or trigger MG exacerbation: Class IA antiarrhythmics, magnesium , flouroquinolones, macrolides, aminoglycosides, penicillamine, curare, interferon alpha,  botox, quinine. Use with caution: calcium  channel blockers, beta blockers and statins.    Macrolides And Ketolides Other (See Comments)   Medications that may worsen or trigger MG exacerbation: Class IA antiarrhythmics, magnesium , flouroquinolones, macrolides, aminoglycosides, penicillamine, curare, interferon alpha, botox, quinine. Use with caution: calcium  channel blockers, beta blockers and statins.    Statins Other (See Comments)   Medications that may worsen or trigger MG exacerbation: Class IA antiarrhythmics, magnesium , flouroquinolones, macrolides, aminoglycosides, penicillamine, curare, interferon alpha, botox, quinine. Use with caution: calcium  channel blockers, beta blockers and statins.         Medication List     TAKE these medications    desonide 0.05 % gel Commonly known as: DESONATE Apply 1 application topically 2 (two) times daily as needed.   diltiazem  90 MG tablet Commonly known as: CARDIZEM  Take 1/2 tablet by mouth every 8 hours.   entecavir  1 MG tablet Commonly known as: BARACLUDE  Take 1 tablet (1 mg total) by mouth daily.   metFORMIN  500 MG tablet Commonly known as: GLUCOPHAGE  Take 2 tablets (1,000 mg total) by mouth 2 (two) times daily with a meal.   multivitamin with minerals Tabs tablet Take  1 tablet by mouth daily.   OneTouch Ultra test strip Generic drug: glucose blood USE TO TEST BLOOD SUGAR 3 TIMES DAILY   onetouch ultrasoft lancets USE AS INSTRUCTED TO CHECK BLOOD SUGARS THREE TIMES A DAY   pantoprazole  40 MG tablet Commonly known as: PROTONIX  TAKE 1 TABLET BY MOUTH 2 TIMES A DAY BEFORE A MEAL   predniSONE  10 MG tablet Commonly known as: DELTASONE  Take 10 mg by mouth daily with breakfast.   pyridostigmine  60 MG tablet Commonly known as: MESTINON  Take 1 tablet (60 mg total) by mouth 4 (four) times daily. What changed: when to take this   repaglinide  0.5 MG tablet Commonly known as: PRANDIN  Take 1 tablet (0.5 mg total) by mouth daily  before supper.   sitaGLIPtin  100 MG tablet Commonly known as: Januvia  Take 1 tablet (100 mg total) by mouth daily.   Vitamin D  50 MCG (2000 UT) tablet Take 2,000 Units by mouth daily.        Allergies  Allergen Reactions   Azithromycin Other (See Comments)    REACTION: exac of his mg   Beta Adrenergic Blockers Other (See Comments)    REACTION: exac of mg   Calcium  Channel Blockers Other (See Comments)    Medications that may worsen or trigger MG exacerbation: Class IA antiarrhythmics, magnesium , flouroquinolones, macrolides, aminoglycosides, penicillamine, curare, interferon alpha, botox, quinine. Use with caution: calcium  channel blockers, beta blockers and statins.    Macrolides And Ketolides Other (See Comments)    Medications that may worsen or trigger MG exacerbation: Class IA antiarrhythmics, magnesium , flouroquinolones, macrolides, aminoglycosides, penicillamine, curare, interferon alpha, botox, quinine. Use with caution: calcium  channel blockers, beta blockers and statins.    Statins Other (See Comments)    Medications that may worsen or trigger MG exacerbation: Class IA antiarrhythmics, magnesium , flouroquinolones, macrolides, aminoglycosides, penicillamine, curare, interferon alpha, botox, quinine. Use with caution: calcium  channel blockers, beta blockers and statins.      Other Procedures/Studies: DG Swallowing Func-Speech Pathology Result Date: 03/08/2023 Table formatting from the original result was not included.  03/05/23 1000 SLP Visit Information SLP Received On 03/05/23 Pain Assessment Pain Assessment No/denies pain General Information HPI Dennies Sarvis is a 68 y.o. male presented to the ED with complaint of difficulty with swallowing.  Patient has a history of myasthenia gravis and follows with [facility] neurology for this issue. Per chart he is on daily prednisone  and Mestinon  TID. Per chart patient says he is really not able to do much more than take a few sips of water   about, because food is getting stuck in his throat and difficulty swallowing his pills. He thymoma status post radiation and resection. Per [facility] neurology is recommending medical admission and IVIG therapy. Pt had FEES 06/30/22, trace penetration from diffuse pharyngeal residue and loosened secretions. No aspiration. Recommend initiate ice chips to rehydrate mucosa and start utilizing swallow mechanism. Plan for repeat FEES or MBS in next week to determine tolerance for next steps. Caregiver present No Diet Prior to this Study NPO Temperature  Normal Respiratory Status WFL Supplemental O2 None (Room air) History of Recent Intubation No Behavior/Cognition Alert;Cooperative;Pleasant mood Self-Feeding Abilities Able to self-feed Baseline vocal quality/speech Normal Anatomy WFL Orofacial Exam Oral Cavity: Oral Hygiene WFL Oral Cavity - Dentition Missing dentition Orofacial Anatomy WFL Boluses Administered Boluses Administered Thin liquids (Level 0);Mildly thick liquids (Level 2, nectar thick);Moderately thick liquids (Level 3, honey thick);Puree;Solid Oral Impairment Domain Lip Closure No labial escape Tongue control during bolus hold Cohesive bolus between tongue  to palatal seal Bolus preparation/mastication Slow prolonged chewing/mashing with complete recollection Bolus transport/lingual motion Brisk tongue motion Oral residue Residue collection on oral structures Location of oral residue  Tongue Initiation of pharyngeal swallow  Posterior laryngeal surface of the epiglottis Pharyngeal Impairment Domain Soft palate elevation No bolus between soft palate (SP)/pharyngeal wall (PW) Laryngeal elevation Complete superior movement of thyroid  cartilage with complete approximation of arytenoids to epiglottic petiole Anterior hyoid excursion Complete anterior movement Epiglottic movement Complete inversion Laryngeal vestibule closure Complete, no air/contrast in laryngeal vestibule Pharyngeal stripping wave  Present - complete  Pharyngeal contraction (A/P view only) N/A Pharyngoesophageal segment opening Complete distension and complete duration, no obstruction of flow Tongue base retraction No contrast between tongue base and posterior pharyngeal wall (PPW) Pharyngeal residue Collection of residue within or on pharyngeal structures Location of pharyngeal residue Pyriform sinuses;Valleculae Esophageal Impairment Domain Esophageal clearance upright position Complete clearance, esophageal coating Pill Consistency administered Thin liquids (Level 0) Thin liquids (Level 0) WFL Penetration/Aspiration Scale Score 1.  Material does not enter airway Mildly thick liquids (Level 2, nectar thick);Moderately thick liquids (Level 3, honey thick);Puree;Solid;Pill 2.  Material enters airway, remains ABOVE vocal cords then ejected out Thin liquids (Level 0) Compensatory Strategies Compensatory strategies No Clinical Impression Clinical Impression Pt presents with mild oropharyngeal dysphagia (DIGEST Score: 1). Compared to BSE on 02/02, pt exhibits singificant improvements. Pt notes that after receving medication in evening of 02/02, he could swallow and speak better again. Mild pharyngeal residue was observed during nectar, honey, and puree trials. Solid trial displayed prolonged chewing and multiple swallows to complete oral clearance. Pt stated that he is aware of prolonged chewing and tries to follow bites with sips. Recommend pt to start a dysphagia 3 diet with thin liquids due to concerns about fatigue during given activity. Pt noted that he has experience with MG crisis and symptoms, as well as verbalizes understanding about strategies to use in them. SLP Visit Diagnosis Dysphagia, unspecified (R13.10) Factors that may increase risk of adverse event in presence of aspiration Noe & Lianne 2021) Poor general health and/or compromised immunity Exam Limitations No limitations Swallowing Evaluation Recommendations Recommendations PO diet PO Diet  Recommendation Dysphagia 3 (Mechanical soft);Thin liquids (Level 0) Liquid Administration via Cup;Straw Medication Administration Whole meds with liquid Supervision Patient able to self-feed Swallowing strategies   Follow solids with liquids Oral care recommendations Oral care BID (2x/day) Treatment Plan Treatment recommendations Therapy as outlined in treatment plan below Follow-up recommendations Follow physicians's recommendations for discharge plan and follow up therapies Functional status assessment Patient has had a recent decline in their functional status and demonstrates the ability to make significant improvements in function in a reasonable and predictable amount of time. Treatment frequency Min 1x/week Treatment duration 2 weeks Interventions Patient/family education;Compensatory techniques;Aspiration precaution training Goal Planning Prognosis for improved oropharyngeal function Good Consulted and agree with results and recommendations Patient SLP Time Calculation SLP Start Time (ACUTE ONLY) 0940 SLP Stop Time (ACUTE ONLY) 1000 SLP Time Calculation (min) (ACUTE ONLY) 20 min SLP Evaluations $ SLP Speech Visit 1 Visit SLP Evaluations $MBS Swallow 1 Procedure   DG Abd Portable 1V Result Date: 03/04/2023 CLINICAL DATA:  Nasogastric tube present EXAM: PORTABLE ABDOMEN - 1 VIEW COMPARISON:  06/23/2022 FINDINGS: Limited field of view for tube placement verification. An enteric tube is present with tip projecting over the left upper quadrant consistent with location in the upper stomach. Image technique limits evaluation of lung bases and soft tissue components. IMPRESSION: Enteric tube tip projects  over the left upper quadrant consistent with location in the upper stomach. Electronically Signed   By: Elsie Gravely M.D.   On: 03/04/2023 19:16   DG Chest 2 View Result Date: 03/04/2023 CLINICAL DATA:  Dysphasia. EXAM: CHEST - 2 VIEW COMPARISON:  07/18/2022 FINDINGS: Sternotomy wires and mediastinal  surgical clips are again noted. The heart size appears normal. There is no pleural fluid, interstitial edema or consolidative change. No signs of pneumothorax. IMPRESSION: 1. No acute cardiopulmonary disease. 2. Previous CABG. Electronically Signed   By: Waddell Calk M.D.   On: 03/04/2023 14:12     TODAY-DAY OF DISCHARGE:  Subjective:   [place] Hinsley today has no headache,no chest abdominal pain,no new weakness tingling or numbness, feels much better wants to go home today.   Objective:   Blood pressure 132/84, pulse 84, temperature 98.2 F (36.8 C), temperature source Oral, resp. rate 18, height 5' 5 (1.651 m), weight 45.5 kg, SpO2 97%.  Intake/Output Summary (Last 24 hours) at 03/09/2023 0830 Last data filed at 03/08/2023 2021 Gross per 24 hour  Intake 3 ml  Output --  Net 3 ml   Filed Weights   03/04/23 1148  Weight: 45.5 kg    Exam: Awake Alert, Oriented *3, No new F.N deficits, Normal affect .AT,PERRAL Supple Neck,No JVD, No cervical lymphadenopathy appriciated.  Symmetrical Chest wall movement, Good air movement bilaterally, CTAB RRR,No Gallops,Rubs or new Murmurs, No Parasternal Heave +ve B.Sounds, Abd Soft, Non tender, No organomegaly appriciated, No rebound -guarding or rigidity. No Cyanosis, Clubbing or edema, No new Rash or bruise   PERTINENT RADIOLOGIC STUDIES: No results found.   PERTINENT LAB RESULTS: CBC: Recent Labs    03/08/23 0457  WBC 5.6  HGB 8.4*  HCT 28.1*  PLT 312   CMET CMP     Component Value Date/Time   NA 133 (L) 03/08/2023 0457   K 3.7 03/08/2023 0457   CL 98 03/08/2023 0457   CO2 26 03/08/2023 0457   GLUCOSE 78 03/08/2023 0457   BUN 18 03/08/2023 0457   CREATININE 0.82 03/08/2023 0457   CREATININE 0.87 02/12/2023 1439   CALCIUM  9.0 03/08/2023 0457   CALCIUM  10.3 11/23/2008 2224   PROT 7.2 03/05/2023 0449   ALBUMIN 3.8 03/05/2023 0449   AST 19 03/05/2023 0449   ALT 11 03/05/2023 0449   ALKPHOS 35 (L) 03/05/2023 0449    BILITOT 0.6 03/05/2023 0449   GFR 62.62 07/21/2022 1243   GFRNONAA >60 03/08/2023 0457    GFR Estimated Creatinine Clearance: 57 mL/min (by C-G formula based on SCr of 0.82 mg/dL). No results for input(s): LIPASE, AMYLASE in the last 72 hours. No results for input(s): CKTOTAL, CKMB, CKMBINDEX, TROPONINI in the last 72 hours. Invalid input(s): POCBNP No results for input(s): DDIMER in the last 72 hours. No results for input(s): HGBA1C in the last 72 hours. No results for input(s): CHOL, HDL, LDLCALC, TRIG, CHOLHDL, LDLDIRECT in the last 72 hours. No results for input(s): TSH, T4TOTAL, T3FREE, THYROIDAB in the last 72 hours.  Invalid input(s): FREET3 No results for input(s): VITAMINB12, FOLATE, FERRITIN, TIBC, IRON, RETICCTPCT in the last 72 hours. Coags: No results for input(s): INR in the last 72 hours.  Invalid input(s): PT Microbiology: No results found for this or any previous visit (from the past 240 hours).  FURTHER DISCHARGE INSTRUCTIONS:  Get Medicines reviewed and adjusted: Please take all your medications with you for your next visit with your Primary MD  Laboratory/radiological data: Please request your Primary MD  to go over all hospital tests and procedure/radiological results at the follow up, please ask your Primary MD to get all Hospital records sent to his/her office.  In some cases, they will be blood work, cultures and biopsy results pending at the time of your discharge. Please request that your primary care M.D. goes through all the records of your hospital data and follows up on these results.  Also Note the following: If you experience worsening of your admission symptoms, develop shortness of breath, life threatening emergency, suicidal or homicidal thoughts you must seek medical attention immediately by calling 911 or calling your MD immediately  if symptoms less severe.  You must read complete  instructions/literature along with all the possible adverse reactions/side effects for all the Medicines you take and that have been prescribed to you. Take any new Medicines after you have completely understood and accpet all the possible adverse reactions/side effects.   Do not drive when taking Pain medications or sleeping medications (Benzodaizepines)  Do not take more than prescribed Pain, Sleep and Anxiety Medications. It is not advisable to combine anxiety,sleep and pain medications without talking with your primary care practitioner  Special Instructions: If you have smoked or chewed Tobacco  in the last 2 yrs please stop smoking, stop any regular Alcohol   and or any Recreational drug use.  Wear Seat belts while driving.  Please note: You were cared for by a hospitalist during your hospital stay. Once you are discharged, your primary care physician will handle any further medical issues. Please note that NO REFILLS for any discharge medications will be authorized once you are discharged, as it is imperative that you return to your primary care physician (or establish a relationship with a primary care physician if you do not have one) for your post hospital discharge needs so that they can reassess your need for medications and monitor your lab values.  Total Time spent coordinating discharge including counseling, education and face to face time equals greater than 30 minutes.  Signed: Tosh Glaze 03/09/2023 8:30 AM

## 2023-03-09 NOTE — Progress Notes (Addendum)
 NEUROLOGY CONSULT FOLLOW UP NOTE   Date of service: March 09, 2023 Patient Name: [name] MRN:  [id number] DOB:  09-17-1955  Interval Hx/subjective  Feels further improved this morning. Swallowing also improved.   Vitals   Vitals:   03/08/23 1951 03/08/23 2349 03/09/23 0409 03/09/23 0747  BP: 130/80 110/82 132/84 113/76  Pulse: 91 91 84 98  Resp: 18 18 18    Temp: 98.5 F (36.9 C) 98.3 F (36.8 C) 98.2 F (36.8 C) 98.9 F (37.2 C)  TempSrc: Oral Oral Oral Oral  SpO2: 98% 97% 97% 100%  Weight:      Height:         Body mass index is 16.69 kg/m.  Physical Exam   HEENT: [place]/AT Lungs: Respirations unlabored Ext: No edema   Neurologic Examination    Mental Status: Patient is alert and oriented. Speech is fluent with intact comprehension. No dysarthria.  Cranial Nerves:  II: PERRL.  III, IV, VI: EOMI without nystagmus. Right ptosis is noted that waxes and wanes subtly during the exam.   VII: Smile is symmetrical.  VIII: Hearing intact to voice. IX, X: Phonation is normal.  XII: No lingual dysarthria. Motor: 4/5 strength bilateral deltoids, 4+/5 strength bilateral elbow flexion, elbow extension, hand grip. 4+/5 strength bilateral hip flexion and knee extension  Coordination: FTN intact bilaterally Gait: Normal gait and station. Able to rise and stand with own power.  Medications  Current Facility-Administered Medications:    acetaminophen  (TYLENOL ) tablet 650 mg, 650 mg, Oral, Q6H PRN, 650 mg at 03/06/23 0002 **OR** acetaminophen  (TYLENOL ) suppository 650 mg, 650 mg, Rectal, Q6H PRN, Melvin, Alexander B, MD   dextrose  50 % solution 50 mL, 1 ampule, Intravenous, PRN, Sundil, Subrina, MD   diclofenac  Sodium (VOLTAREN ) 1 % topical gel 2 g, 2 g, Topical, QID, Sundil, Subrina, MD, 2 g at 03/09/23 0909   diltiazem  (CARDIZEM ) tablet 30 mg, 30 mg, Oral, Q8H, Ghimire, Donalda HERO, MD, 30 mg at 03/09/23 0535   enoxaparin  (LOVENOX ) injection 30 mg, 30 mg, Subcutaneous,  Q24H, Pham, Minh Q, RPH-CPP   Entecavir  (Baraclude ) tablet 1 mg - patient's own supply (at bedside), 1 mg, Oral, Daily, Ghimire, Donalda HERO, MD, 1 mg at 03/09/23 0400   feeding supplement (ENSURE ENLIVE / ENSURE PLUS) liquid 237 mL, 237 mL, Oral, BID BM, [place], Ellouise SQUIBB, MD, 237 mL at 03/09/23 0911   hydrALAZINE  (APRESOLINE ) injection 10 mg, 10 mg, Intravenous, Q6H PRN, Sundil, Subrina, MD   insulin  aspart (novoLOG ) injection 0-9 Units, 0-9 Units, Subcutaneous, TID WC, Ghimire, Donalda HERO, MD, 1 Units at 03/09/23 0910   linagliptin  (TRADJENTA ) tablet 5 mg, 5 mg, Oral, Daily, Ghimire, Donalda HERO, MD, 5 mg at 03/09/23 0909   multivitamin with minerals tablet 1 tablet, 1 tablet, Oral, Daily, Ghimire, Shanker M, MD   pantoprazole  (PROTONIX ) EC tablet 40 mg, 40 mg, Oral, Daily, Ghimire, Shanker M, MD, 40 mg at 03/09/23 0909   predniSONE  (DELTASONE ) tablet 10 mg, 10 mg, Oral, Q breakfast, Ghimire, Shanker M, MD, 10 mg at 03/09/23 0535   pyridostigmine  (MESTINON ) tablet 60 mg, 60 mg, Oral, QID, Sharifah Champine, MD, 60 mg at 03/09/23 0909   sodium chloride  flush (NS) 0.9 % injection 3 mL, 3 mL, Intravenous, Q12H, Seena Marsa NOVAK, MD, 3 mL at 03/09/23 0909  Labs and Diagnostic Imaging   CBC:  Recent Labs  Lab 03/04/23 1255 03/05/23 0449 03/06/23 0741 03/08/23 0457  WBC 9.5   < > 8.5 5.6  NEUTROABS 8.6*  --   --   --  HGB 9.6*   < > 8.7* 8.4*  HCT 33.4*   < > 29.3* 28.1*  MCV 69.0*   < > 67.2* 66.7*  PLT 389   < > 365 312   < > = values in this interval not displayed.    Basic Metabolic Panel:  Lab Results  Component Value Date   NA 133 (L) 03/08/2023   K 3.7 03/08/2023   CO2 26 03/08/2023   GLUCOSE 78 03/08/2023   BUN 18 03/08/2023   CREATININE 0.82 03/08/2023   CALCIUM  9.0 03/08/2023   GFRNONAA >60 03/08/2023   GFRAA >60 09/22/2017   Lipid Panel:  Lab Results  Component Value Date   LDLCALC 68 07/23/2008   HgbA1c:  Lab Results  Component Value Date   HGBA1C 5.9 (A)  02/12/2023   Urine Drug Screen: No results found for: LABOPIA, COCAINSCRNUR, LABBENZ, AMPHETMU, THCU, LABBARB  Alcohol  Level No results found for: [facility] INR  Lab Results  Component Value Date   INR 0.92 09/17/2017     Assessment  68 y.o. male with history of myasthenia gravis, diabetes, PTSD, hyperlipidemia and chronic hepatitis B infection who presented on Sunday morning with increasing dysphagia and weakness in bilateral shoulders, likely due to myasthenia gravis flare.  He states that he typically has difficulty swallowing and can only eat soft food or soups at baseline, but as of Saturday was unable to take anything by mouth, could not swallow his own saliva and could not take any of his medications. As patient was unable to take medications by mouth, he had not been able to get his Mestinon  for the last day PTA.   - Exam is significantly improved today. He is now at the strongest I have seen throughout this admission. Still with right ptosis and mild bilateral deltoid weakness, mild weakness of neck flexion and extension. Breathing is normal/unlabored.  - He has completed his 5-day course of IVIG to treat his MG exacerbation.  - Overall appears stable for discharge home from a neurological standpoint.     Recommendations  - Continue Mestinon  60 mg four times daily. Changed to QID from TID this admission.   - Outpatient Neurology follow up.   - Neurohospitalist service will sign off. Please call if there are additional questions.   ______________________________________________________________________   Bonney SHARK, Ronell Boldin, MD [facility]

## 2023-03-09 NOTE — Care Management Important Message (Signed)
 Important Message  Patient Details  Name: [name] MRN: [id number] Date of Birth: October 12, 1955   Important Message Given:  Yes - Medicare IM  Patient left prior to IM delivery will mail a copy to the patient home address.    Darvell Monteforte 03/09/2023, 3:57 PM

## 2023-03-09 NOTE — Progress Notes (Signed)
 Reviewed AVS, patient expressed understanding of medications, MD follow up reviewed.  Removed IV, Site clean, dry and intact.  Pt transported to Discharge lounge.

## 2023-03-12 ENCOUNTER — Telehealth: Payer: Self-pay

## 2023-03-12 NOTE — Transitions of Care (Post Inpatient/ED Visit) (Signed)
   03/12/2023  Name: [name] MRN: [id number] DOB: Jul 24, 1955  Today's TOC FU Call Status: Today's TOC FU Call Status:: Unsuccessful Call (2nd Attempt) Unsuccessful Call (2nd Attempt) Date: 03/12/23  Attempted to reach the patient regarding the most recent Inpatient/ED visit.  Follow Up Plan: Additional outreach attempts will be made to reach the patient to complete the Transitions of Care (Post Inpatient/ED visit) call.   Orpha Blade, RN, BSN, CEN [employer]- Transition of Care Team.  [facility] [phone]

## 2023-03-12 NOTE — Transitions of Care (Post Inpatient/ED Visit) (Signed)
   03/12/2023  Name: Eddiel Than MRN: [id number] DOB: [birth year]  Today's TOC FU Call Status: Today's TOC FU Call Status:: Unsuccessful Call (1st Attempt) Unsuccessful Call (1st Attempt) Date: 03/12/23  Attempted to reach the patient regarding the most recent Inpatient/ED visit.  Follow Up Plan: Additional outreach attempts will be made to reach the patient to complete the Transitions of Care (Post Inpatient/ED visit) call.   Orpha Blade, RN, BSN, CEN [employer]- Transition of Care Team.  [facility] [phone]

## 2023-03-13 DIAGNOSIS — B181 Chronic viral hepatitis B without delta-agent: Secondary | ICD-10-CM | POA: Diagnosis not present

## 2023-03-14 ENCOUNTER — Telehealth: Payer: Self-pay

## 2023-03-14 NOTE — Transitions of Care (Post Inpatient/ED Visit) (Signed)
   03/14/2023  Name: [name] MRN: [id number] DOB: 1955/08/28  Today's TOC FU Call Status: Today's TOC FU Call Status:: Unsuccessful Call (3rd Attempt) Unsuccessful Call (3rd Attempt) Date: 03/14/23  Attempted to reach the patient regarding the most recent Inpatient/ED visit.  Follow Up Plan: No further outreach attempts will be made at this time. We have been unable to contact the patient.  Lonia Chimera, RN, BSN, CEN [employer]- Transition of Care Team.  [facility] [phone]

## 2023-03-26 ENCOUNTER — Encounter: Payer: Self-pay | Admitting: Family Medicine

## 2023-03-26 ENCOUNTER — Ambulatory Visit (INDEPENDENT_AMBULATORY_CARE_PROVIDER_SITE_OTHER): Payer: Medicare Other | Admitting: Family Medicine

## 2023-03-26 ENCOUNTER — Ambulatory Visit: Payer: Medicare Other | Admitting: Family Medicine

## 2023-03-26 VITALS — BP 132/77 | HR 110 | Resp 16 | Ht 65.0 in | Wt 97.5 lb

## 2023-03-26 DIAGNOSIS — D509 Iron deficiency anemia, unspecified: Secondary | ICD-10-CM | POA: Diagnosis not present

## 2023-03-26 DIAGNOSIS — B181 Chronic viral hepatitis B without delta-agent: Secondary | ICD-10-CM

## 2023-03-26 DIAGNOSIS — I10 Essential (primary) hypertension: Secondary | ICD-10-CM | POA: Diagnosis not present

## 2023-03-26 DIAGNOSIS — R000 Tachycardia, unspecified: Secondary | ICD-10-CM | POA: Diagnosis not present

## 2023-03-26 DIAGNOSIS — Z23 Encounter for immunization: Secondary | ICD-10-CM

## 2023-03-26 DIAGNOSIS — G7000 Myasthenia gravis without (acute) exacerbation: Secondary | ICD-10-CM

## 2023-03-26 NOTE — Assessment & Plan Note (Addendum)
 BP initially elevated, improved when re-checked and home BP readings at goal. Continue diltiazem 90 mg 1/2 tablet 3 times daily and low salt diet. Continue monitoring BP regularly. Follow-up in a year, before if needed.

## 2023-03-26 NOTE — Patient Instructions (Addendum)
 A few things to remember from today's visit:  Hypertension, essential, benign  Microcytic anemia - Plan: CBC with Differential/Platelet, Iron, Ferritin  Myasthenia gravis (HCC)  No changes today. I will continue seeing you annually as far as health issues are stable.  Do not use My Chart to request refills or for acute issues that need immediate attention. If you send a my chart message, it may take a few days to be addressed, specially if I am not in the office.  Please be sure medication list is accurate. If a new problem present, please set up appointment sooner than planned today.

## 2023-03-26 NOTE — Assessment & Plan Note (Addendum)
 Has had IVIG and most recently plasmapheresis. Currently on Mestinon 60 mg four times daily and prednisone 10 mg daily. Problem has improved slightly but is still symptomatic, specially intermittent dysarthria and chewing fatigability as well as shoulder weakness. On examination today I do not appreciate focal deficit. . Following with neurologist.

## 2023-03-26 NOTE — Assessment & Plan Note (Signed)
 He reports HR at home under 100/min. Currently on diltiazem 90 mg 1/2 tablet 3 times daily. Continue monitoring HR at home.

## 2023-03-26 NOTE — Assessment & Plan Note (Signed)
 Currently on Entecavir 1 mg daily. Follows with hepatologist.

## 2023-03-26 NOTE — Progress Notes (Unsigned)
 HPI: [name] is a 68 y.o. male with a PMHx significant for HTN, DM II, sinus tachycardia, myasthenia gravis, PTSD, chronic hepatitis B, and HLD, who is here today for chronic disease management.  Last seen on 01/01/2023  Patient was hospitalized from 2/2 - 2/7 for a myasthenia gravis flare up. His Mestinon was increased from 3 tablets per day to 4 tablets per day.  Currently on Mestinon 60 mg four times daily and prednisone 10 mg daily. Reports undergoing plasmapheresis in 12/2022, which seemed to help for about 2 weeks but then symptoms returned. He says he is still having shoulder weakness.  Intermittent right palpebral ptosis, dysphagia, and voice changes. Denies stridor, wheezing, or dyspnea. His next appointment with neurology is 3/6.   Hypertension:  Medications: Currently on diltiazem 90 mg 1/2 tab 3 times daily.  BP readings at home: He has been checking his BP at home and his readings have been 120s/70s-80s.   Sinus tachycardia: He says his HR is usually less than 100, but occasionally over 100.  Negative for unusual or severe headache, visual changes, exertional chest pain, DOE,or edema.  Negative for palpitations or tremor.  Lab Results  Component Value Date   CREATININE 0.82 03/08/2023   BUN 18 03/08/2023   NA 133 (L) 03/08/2023   K 3.7 03/08/2023   CL 98 03/08/2023   CO2 26 03/08/2023   Diabetes Mellitus II:  Currently on metformin 1000 mg twice daily, Januvia 100 mg daily, and Prandin  0.5 mg daily.  - He sees endocrinologist once per year.   Lab Results  Component Value Date   HGBA1C 5.9 (A) 02/12/2023   Lab Results  Component Value Date   MICROALBUR 2.7 02/12/2023   Anxiety/PTSD:  No longer following with psychiatry.  He is not longer on pharmacologic treatment. He says he is able to sleep through the night, and takes naps when he needs to rest during the day.   Anemia:  He has not noted blood in the stool, melena, or gross hematuria.       Component Value Date/Time   WBC 5.6 03/08/2023 0457   RBC 4.21 (L) 03/08/2023 0457   HGB 8.4 (L) 03/08/2023 0457   HCT 28.1 (L) 03/08/2023 0457   PLT 312 03/08/2023 0457   MCV 66.7 (L) 03/08/2023 0457   MCH 20.0 (L) 03/08/2023 0457   MCHC 29.9 (L) 03/08/2023 0457   RDW 17.8 (H) 03/08/2023 0457   LYMPHSABS 0.8 03/04/2023 1255   MONOABS 0.2 03/04/2023 1255   EOSABS 0.0 03/04/2023 1255   BASOSABS 0.0 03/04/2023 1255      Latest Ref Rng & Units 03/08/2023    4:57 AM 03/06/2023    7:41 AM 03/05/2023    4:49 AM  CBC  WBC 4.0 - 10.5 K/uL 5.6  8.5  9.7   Hemoglobin 13.0 - 17.0 g/dL 8.4  8.7  8.7   Hematocrit 39.0 - 52.0 % 28.1  29.3  29.8   Platelets 150 - 400 K/uL 312  365  377    He has not had colonoscopy in the past and not interested in doing so.  Chronic hepatitis B: Following with hepatology once per year.  He is on Entecavir 1 mg daily. Component Ref Range & Units 13 d ago  Hepatitis B Quantitation IU/mL HBV DNA not detected   AFP-Tumor Marker 0.0 - 8.4 ng/mL <1.8   Review of Systems  Constitutional:  Positive for fatigue. Negative for activity change, appetite change and fever.  HENT:  Negative for sore throat and trouble swallowing.   Respiratory:  Negative for cough and wheezing.   Gastrointestinal:  Negative for abdominal pain, nausea and vomiting.  Genitourinary:  Negative for decreased urine volume and dysuria.  Skin:  Negative for rash.  Neurological:  Negative for syncope and facial asymmetry.  Psychiatric/Behavioral:  Negative for confusion and hallucinations.   See other pertinent positives and negatives in HPI.  Current Outpatient Medications on File Prior to Visit  Medication Sig Dispense Refill   Cholecalciferol (VITAMIN D) 2000 UNITS tablet Take 2,000 Units by mouth daily.     desonide (DESONATE) 0.05 % gel Apply 1 application topically 2 (two) times daily as needed.      diltiazem (CARDIZEM) 90 MG tablet Take 1/2 tablet by mouth every 8 hours. 135  tablet 2   entecavir (BARACLUDE) 1 MG tablet Take 1 tablet (1 mg total) by mouth daily. 30 tablet 6   glucose blood (ONETOUCH ULTRA) test strip USE TO TEST BLOOD SUGAR 3 TIMES DAILY 100 strip 11   Lancets (ONETOUCH ULTRASOFT) lancets USE AS INSTRUCTED TO CHECK BLOOD SUGARS THREE TIMES A DAY 200 each 3   metFORMIN (GLUCOPHAGE) 500 MG tablet Take 2 tablets (1,000 mg total) by mouth 2 (two) times daily with a meal. 360 tablet 3   Multiple Vitamin (MULTIVITAMIN WITH MINERALS) TABS tablet Take 1 tablet by mouth daily. 100 tablet 0   pantoprazole (PROTONIX) 40 MG tablet TAKE 1 TABLET BY MOUTH 2 TIMES A DAY BEFORE A MEAL 60 tablet 5   predniSONE (DELTASONE) 10 MG tablet Take 10 mg by mouth daily with breakfast.     pyridostigmine (MESTINON) 60 MG tablet Take 1 tablet (60 mg total) by mouth 4 (four) times daily.     repaglinide (PRANDIN) 0.5 MG tablet Take 1 tablet (0.5 mg total) by mouth daily before supper. 90 tablet 3   sitaGLIPtin (JANUVIA) 100 MG tablet Take 1 tablet (100 mg total) by mouth daily. 90 tablet 3   No current facility-administered medications on file prior to visit.    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)    Spongiotic dermatitis    Type II or unspecified type diabetes mellitus without mention of complication, not stated as uncontrolled    Viral hepatitis B without mention of hepatic coma, chronic, without mention of hepatitis delta    Allergies  Allergen Reactions   Azithromycin Other (See Comments)    REACTION: exac of his mg   Beta Adrenergic Blockers Other (See Comments)    REACTION: exac of mg    Calcium Channel Blockers Other (See Comments)    Medications that may worsen or trigger MG exacerbation: Class IA antiarrhythmics, magnesium, flouroquinolones, macrolides, aminoglycosides, penicillamine, curare, interferon alpha, botox, quinine. Use with caution: calcium channel blockers, beta blockers and statins.    Macrolides And Ketolides Other (See Comments)    Medications that may worsen or trigger MG exacerbation: Class IA antiarrhythmics, magnesium, flouroquinolones, macrolides, aminoglycosides, penicillamine, curare, interferon alpha, botox, quinine. Use with caution: calcium channel blockers, beta blockers and statins.    Statins Other (See  Comments)    Medications that may worsen or trigger MG exacerbation: Class IA antiarrhythmics, magnesium, flouroquinolones, macrolides, aminoglycosides, penicillamine, curare, interferon alpha, botox, quinine. Use with caution: calcium channel blockers, beta blockers and statins.     Social History   Socioeconomic History   Marital status: Married    Spouse name: Not on file   Number of children: Not on file   Years of education: Not on file   Highest education level: Not on file  Occupational History   Not on file  Tobacco Use   Smoking status: Never   Smokeless tobacco: Never  Substance and Sexual Activity   Alcohol use: No   Drug use: No   Sexual activity: Not on file  Other Topics Concern   Not on file  Social History Narrative   Not on file   Social Drivers of Health   Financial Resource Strain: Not on file  Food Insecurity: No Food Insecurity (03/05/2023)   Hunger Vital Sign    Worried About Running Out of Food in the Last Year: Never true    Ran Out of Food in the Last Year: Never true  Transportation Needs: No Transportation Needs (03/05/2023)   PRAPARE - Administrator, Civil Service (Medical): No    Lack of Transportation (Non-Medical): No  Physical Activity: Not on file  Stress: Not on file  Social  Connections: Moderately Integrated (03/05/2023)   Social Connection and Isolation Panel [NHANES]    Frequency of Communication with Friends and Family: More than three times a week    Frequency of Social Gatherings with Friends and Family: More than three times a week    Attends Religious Services: More than 4 times per year    Active Member of Clubs or Organizations: No    Attends Banker Meetings: Never    Marital Status: Married    Vitals:   03/26/23 1450 03/26/23 1821  BP: (!) 148/80 132/77  Pulse: (!) 110   Resp: 16   SpO2: 99%    Body mass index is 16.22 kg/m.  Physical Exam Vitals and nursing note reviewed.  Constitutional:      General: He is not in acute distress.    Appearance: He is well-developed.  HENT:     Head: Normocephalic and atraumatic.     Mouth/Throat:     Mouth: Mucous membranes are moist.  Eyes:     Conjunctiva/sclera: Conjunctivae normal.  Cardiovascular:     Rate and Rhythm: Regular rhythm. Tachycardia present.     Pulses:          Dorsalis pedis pulses are 2+ on the right side and 2+ on the left side.     Heart sounds: No murmur heard. Pulmonary:     Effort: Pulmonary effort is normal. No respiratory distress.     Breath sounds: Normal breath sounds.  Abdominal:     Palpations: Abdomen is soft. There is no hepatomegaly or mass.     Tenderness: There is no abdominal tenderness.  Musculoskeletal:     Right shoulder: No tenderness. Normal range of motion.     Left shoulder: No tenderness. Normal range of motion.     Right lower leg: No edema.     Left lower leg: No edema.  Lymphadenopathy:     Cervical: No cervical adenopathy.  Skin:    General: Skin is warm.     Findings: No erythema or rash.  Neurological:     General: No focal deficit  present.     Mental Status: He is alert and oriented to person, place, and time.     Cranial Nerves: No cranial nerve deficit.     Gait: Gait normal.  Psychiatric:        Mood and Affect:  Mood is anxious. Affect is labile (when describing his experiences during [place] war.).     Comments: Well groomed, good eye contact.    ASSESSMENT AND PLAN:  Mr. Alicea was seen today for chronic disease management.   Orders Placed This Encounter  Procedures   Pneumococcal conjugate vaccine 20-valent (Prevnar 20)   CBC with Differential/Platelet   Iron   Ferritin   Lab Results  Component Value Date   WBC 8.3 03/26/2023   HGB 8.8 Repeated and verified X2. (L) 03/26/2023   HCT 29.4 (L) 03/26/2023   MCV 67.6 Repeated and verified X2. (L) 03/26/2023   PLT 413.0 (H) 03/26/2023   Microcytic anemia Mild until 05/2022, since then it has fluctuated, has been as low as 7.0 kin 07/2022 and most recent 8.4 on 03/08/23. He declined GI referral. Further recommendations according to lab results.  -     CBC with Differential/Platelet; Future -     Iron; Future -     Ferritin; Future  Hypertension, essential, benign Assessment & Plan: BP initially elevated, improved when re-checked and home BP readings at goal. Continue diltiazem 90 mg 1/2 tablet 3 times daily and low salt diet. Continue monitoring BP regularly. Follow-up in a year, before if needed.  Sinus tachycardia Assessment & Plan: He reports HR at home under 100/min. Currently on diltiazem 90 mg 1/2 tablet 3 times daily. Continue monitoring HR at home.  Myasthenia gravis [facility]) Assessment & Plan: Has had IVIG and most recently plasmapheresis. Currently on Mestinon 60 mg four times daily and prednisone 10 mg daily. Problem has improved slightly but is still symptomatic, specially intermittent dysarthria and chewing fatigability as well as shoulder weakness. On examination today I do not appreciate focal deficit. . Following with neurologist.  Need for pneumococcal vaccination -     Pneumococcal conjugate vaccine 20-valent  Hepatitis B carrier (HCC) Assessment & Plan: Currently on Entecavir 1 mg daily. Follows with  hepatologist.  I spent a total of 40 minutes in both face to face and non face to face activities for this visit on the date of this encounter. During this time history was obtained and documented, examination was performed, prior labs reviewed, and assessment/plan discussed.  Return in about 1 year (around 03/25/2024) for chronic problems.  I, Rolla Etienne Wierda, acting as a scribe for Natan Hartog [place], MD., have documented all relevant documentation on the behalf of Katanya Schlie [place], MD, as directed by  Ashiah Karpowicz [place], MD while in the presence of Katti Pelle [place], MD.   I, Eathan Groman [place], MD, have reviewed all documentation for this visit. The documentation on 03/26/23 for the exam, diagnosis, procedures, and orders are all accurate and complete.  Deina Lipsey G. [place], MD  [facility] [place] office.

## 2023-03-27 LAB — CBC WITH DIFFERENTIAL/PLATELET
Basophils Absolute: 0.1 10*3/uL (ref 0.0–0.1)
Basophils Relative: 1.6 % (ref 0.0–3.0)
Eosinophils Absolute: 0.1 10*3/uL (ref 0.0–0.7)
Eosinophils Relative: 0.6 % (ref 0.0–5.0)
HCT: 29.4 % — ABNORMAL LOW (ref 39.0–52.0)
Hemoglobin: 8.8 g/dL — ABNORMAL LOW (ref 13.0–17.0)
Lymphocytes Relative: 23.4 % (ref 12.0–46.0)
Lymphs Abs: 1.9 10*3/uL (ref 0.7–4.0)
MCHC: 30 g/dL (ref 30.0–36.0)
MCV: 67.6 fl — ABNORMAL LOW (ref 78.0–100.0)
Monocytes Absolute: 0.4 10*3/uL (ref 0.1–1.0)
Monocytes Relative: 5.4 % (ref 3.0–12.0)
Neutro Abs: 5.7 10*3/uL (ref 1.4–7.7)
Neutrophils Relative %: 69 % (ref 43.0–77.0)
Platelets: 413 10*3/uL — ABNORMAL HIGH (ref 150.0–400.0)
RBC: 4.34 Mil/uL (ref 4.22–5.81)
RDW: 19.9 % — ABNORMAL HIGH (ref 11.5–15.5)
WBC: 8.3 10*3/uL (ref 4.0–10.5)

## 2023-03-27 LAB — FERRITIN: Ferritin: 15 ng/mL — ABNORMAL LOW (ref 22.0–322.0)

## 2023-03-27 LAB — IRON: Iron: 20 ug/dL — ABNORMAL LOW (ref 42–165)

## 2023-03-27 MED ORDER — FERROUS SULFATE 325 (65 FE) MG PO TABS: 325.0000 mg | ORAL_TABLET | Freq: Every day | ORAL | 2 refills | Status: AC

## 2023-03-30 ENCOUNTER — Other Ambulatory Visit: Payer: Self-pay

## 2023-03-30 DIAGNOSIS — D509 Iron deficiency anemia, unspecified: Secondary | ICD-10-CM

## 2023-04-04 DIAGNOSIS — F4310 Post-traumatic stress disorder, unspecified: Secondary | ICD-10-CM | POA: Diagnosis not present

## 2023-04-04 DIAGNOSIS — R1312 Dysphagia, oropharyngeal phase: Secondary | ICD-10-CM | POA: Diagnosis not present

## 2023-04-04 DIAGNOSIS — Z923 Personal history of irradiation: Secondary | ICD-10-CM | POA: Diagnosis not present

## 2023-04-04 DIAGNOSIS — Z681 Body mass index (BMI) 19 or less, adult: Secondary | ICD-10-CM | POA: Diagnosis not present

## 2023-04-04 DIAGNOSIS — B181 Chronic viral hepatitis B without delta-agent: Secondary | ICD-10-CM | POA: Diagnosis not present

## 2023-04-04 DIAGNOSIS — Z79899 Other long term (current) drug therapy: Secondary | ICD-10-CM | POA: Diagnosis not present

## 2023-04-04 DIAGNOSIS — Z7984 Long term (current) use of oral hypoglycemic drugs: Secondary | ICD-10-CM | POA: Diagnosis not present

## 2023-04-04 DIAGNOSIS — I10 Essential (primary) hypertension: Secondary | ICD-10-CM | POA: Diagnosis not present

## 2023-04-04 DIAGNOSIS — L299 Pruritus, unspecified: Secondary | ICD-10-CM | POA: Diagnosis not present

## 2023-04-04 DIAGNOSIS — L509 Urticaria, unspecified: Secondary | ICD-10-CM | POA: Diagnosis not present

## 2023-04-04 DIAGNOSIS — G7000 Myasthenia gravis without (acute) exacerbation: Secondary | ICD-10-CM | POA: Diagnosis not present

## 2023-04-04 DIAGNOSIS — E871 Hypo-osmolality and hyponatremia: Secondary | ICD-10-CM | POA: Diagnosis not present

## 2023-04-04 DIAGNOSIS — E559 Vitamin D deficiency, unspecified: Secondary | ICD-10-CM | POA: Diagnosis not present

## 2023-04-04 DIAGNOSIS — Z85238 Personal history of other malignant neoplasm of thymus: Secondary | ICD-10-CM | POA: Diagnosis not present

## 2023-04-04 DIAGNOSIS — G7001 Myasthenia gravis with (acute) exacerbation: Secondary | ICD-10-CM | POA: Diagnosis not present

## 2023-04-04 DIAGNOSIS — R1310 Dysphagia, unspecified: Secondary | ICD-10-CM | POA: Diagnosis not present

## 2023-04-04 DIAGNOSIS — E119 Type 2 diabetes mellitus without complications: Secondary | ICD-10-CM | POA: Diagnosis not present

## 2023-04-04 DIAGNOSIS — D509 Iron deficiency anemia, unspecified: Secondary | ICD-10-CM | POA: Diagnosis not present

## 2023-04-04 DIAGNOSIS — K219 Gastro-esophageal reflux disease without esophagitis: Secondary | ICD-10-CM | POA: Diagnosis not present

## 2023-04-04 DIAGNOSIS — E441 Mild protein-calorie malnutrition: Secondary | ICD-10-CM | POA: Diagnosis not present

## 2023-04-09 ENCOUNTER — Telehealth: Payer: Self-pay

## 2023-04-09 NOTE — Transitions of Care (Post Inpatient/ED Visit) (Signed)
   04/09/2023  Name: [name] MRN: [id number] DOB: 1955/05/04  Today's TOC FU Call Status: Today's TOC FU Call Status:: Unsuccessful Call (1st Attempt)  Attempted to reach the patient regarding the most recent Inpatient/ED visit.  Follow Up Plan: Additional outreach attempts will be made to reach the patient to complete the Transitions of Care (Post Inpatient/ED visit) call.       Antionette Fairy, RN,BSN, CCM [facility]  VBCI-Population Health Manager Population Health Direct Dial: [phone]

## 2023-04-10 ENCOUNTER — Telehealth: Payer: Self-pay

## 2023-04-10 NOTE — Transitions of Care (Post Inpatient/ED Visit) (Signed)
   04/10/2023  Name: [name] MRN: [id number] DOB: [birth year]  Today's TOC FU Call Status: Today's TOC FU Call Status:: Unsuccessful Call (2nd Attempt) Unsuccessful Call (2nd Attempt) Date: 04/10/23  Attempted to reach the patient regarding the most recent Inpatient/ED visit.  Follow Up Plan: Additional outreach attempts will be made to reach the patient to complete the Transitions of Care (Post Inpatient/ED visit) call.   Hilbert Odor RN, CCM [place]  VBCI-Population Health RN Care Manager [phone]

## 2023-04-11 ENCOUNTER — Telehealth: Payer: Self-pay

## 2023-04-11 NOTE — Transitions of Care (Post Inpatient/ED Visit) (Signed)
   04/11/2023  Name: Zevin Nevares MRN: [id number] DOB: 1956-01-10  Today's TOC FU Call Status: Today's TOC FU Call Status:: Unsuccessful Call (3rd Attempt) Unsuccessful Call (3rd Attempt) Date: 04/11/23  Attempted to reach the patient regarding the most recent Inpatient/ED visit.  Follow Up Plan: No further outreach attempts will be made at this time. We have been unable to contact the patient.  Hilbert Odor RN, CCM [place]  VBCI-Population Health RN Care Manager [phone]

## 2023-04-13 DIAGNOSIS — G7000 Myasthenia gravis without (acute) exacerbation: Secondary | ICD-10-CM | POA: Diagnosis not present

## 2023-04-13 DIAGNOSIS — Z5181 Encounter for therapeutic drug level monitoring: Secondary | ICD-10-CM | POA: Diagnosis not present

## 2023-04-25 ENCOUNTER — Encounter: Payer: Medicare Other | Admitting: Physical Medicine and Rehabilitation

## 2023-06-06 ENCOUNTER — Other Ambulatory Visit: Payer: Self-pay | Admitting: Family Medicine

## 2023-06-06 DIAGNOSIS — K219 Gastro-esophageal reflux disease without esophagitis: Secondary | ICD-10-CM

## 2023-06-12 ENCOUNTER — Encounter: Payer: Self-pay | Admitting: Internal Medicine

## 2023-06-12 ENCOUNTER — Ambulatory Visit: Payer: Medicare Other | Admitting: Internal Medicine

## 2023-06-12 VITALS — BP 126/80 | HR 98 | Ht 65.0 in | Wt 104.8 lb

## 2023-06-12 DIAGNOSIS — E0590 Thyrotoxicosis, unspecified without thyrotoxic crisis or storm: Secondary | ICD-10-CM | POA: Diagnosis not present

## 2023-06-12 DIAGNOSIS — Z7984 Long term (current) use of oral hypoglycemic drugs: Secondary | ICD-10-CM | POA: Diagnosis not present

## 2023-06-12 DIAGNOSIS — E1129 Type 2 diabetes mellitus with other diabetic kidney complication: Secondary | ICD-10-CM | POA: Diagnosis not present

## 2023-06-12 DIAGNOSIS — R809 Proteinuria, unspecified: Secondary | ICD-10-CM | POA: Diagnosis not present

## 2023-06-12 LAB — POCT GLUCOSE (DEVICE FOR HOME USE): POC Glucose: 247 mg/dL — AB (ref 70–99)

## 2023-06-12 LAB — POCT GLYCOSYLATED HEMOGLOBIN (HGB A1C): Hemoglobin A1C: 5.4 % (ref 4.0–5.6)

## 2023-06-12 MED ORDER — ONETOUCH DELICA LANCING DEV MISC: 1.0000 | Freq: Every day | 0 refills | Status: AC

## 2023-06-12 MED ORDER — ONETOUCH ULTRASOFT LANCETS MISC: 1.0000 | Freq: Every day | 3 refills | Status: DC

## 2023-06-12 NOTE — Progress Notes (Signed)
 Name: [name]  Age/ Sex: 68 y.o., male   MRN/ DOB: [id number], Jun 05, 1955     PCP: [place], [name], MD   Reason for Endocrinology Evaluation: Type 2 Diabetes Mellitus  Initial Endocrine Consultative Visit: 03/06/2012    PATIENT IDENTIFIER: [name] is a 68 y.o. male with a past medical history of DM, HTN , Myasthenia Gravis and Hep B chronic infection the patient has followed with Endocrinology clinic since 03/06/2012 for consultative assistance with management of his diabetes.  DIABETIC HISTORY:  [name] was diagnosed with DM 2005. His hemoglobin A1c has ranged from 5.8% in 2022, peaking at 6.5% in 2018.     He was followed by Dr, Washington Hacker from 2014 until 05/02/2021  SUBJECTIVE:   During the last visit (02/12/2023): A1c 5.9%    Today (06/12/2023): [name] is here for follow-up on diabetes management.  He checks his blood sugars  1x daily. The patient has not had hypoglycemic episodes since the last clinic visit.   Patient follows with [facility] for myasthenia gravis, s/p thymectomy 2004 with malignant thymoma treated with radiation .  He continues to receive IVIG   Patient follows with [facility] for hepatitis B   Patient is on prednisone  through neurology, recently increased to 15 mg , currently on cellcept and mistinon   Weight continues to fluctuate  Appetite  has improved  Denies nausea, vomiting Denies constipation  or diarrhea  Denies palpitations unless with exertion  NO FH of thyroid  disease    HOME ENDOCRINE REGIMEN:  Januvia  100 mg daily Metformin  500 mg, 2 tabs twice daily Repaglinide  0.5 mg, 1 tablet before supper  Prednisone  15 mg daily per neurology    Statin: No ACE-I/ARB: Yes   METER DOWNLOAD SUMMARY:  n/a Memory recall  Morning 84-107 Evening 86-126   DIABETIC COMPLICATIONS: Microvascular complications:   Denies: CKD , retinopathy,neuropathy Last Eye Exam: Completed  12/06/2022  Macrovascular complications:   Denies: CAD, CVA, PVD   HISTORY:  Past Medical History:  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 He has never used smokeless tobacco. He reports that he does not drink alcohol  and does not use drugs. Family History: No family history on file.   HOME MEDICATIONS: Allergies as of 06/12/2023       Reactions   Azithromycin Other (See Comments)   REACTION: exac of his mg   Beta Adrenergic Blockers Other (See Comments)   REACTION: exac of mg   Calcium  Channel Blockers Other (See Comments)   Medications that may worsen or trigger MG exacerbation: Class IA antiarrhythmics, magnesium , flouroquinolones, macrolides, aminoglycosides, penicillamine, curare, interferon alpha, botox, quinine. Use with caution: calcium  channel  blockers, beta blockers and statins.    Macrolides And Ketolides Other (See Comments)   Medications that may worsen or trigger MG exacerbation: Class IA antiarrhythmics, magnesium , flouroquinolones, macrolides, aminoglycosides, penicillamine, curare, interferon alpha, botox, quinine. Use with caution: calcium  channel blockers, beta blockers and statins.    Statins Other (See Comments)    Medications that may worsen or trigger MG exacerbation: Class IA antiarrhythmics, magnesium , flouroquinolones, macrolides, aminoglycosides, penicillamine, curare, interferon alpha, botox, quinine. Use with caution: calcium  channel blockers, beta blockers and statins.         Medication List        Accurate as of Jun 12, 2023 11:19 AM. If you have any questions, ask your nurse or doctor.          desonide 0.05 % gel Commonly known as: DESONATE Apply 1 application topically 2 (two) times daily as needed.   diltiazem  60 MG 12 hr capsule Commonly known as: CARDIZEM  SR Take 45 mg by mouth.   diltiazem  90 MG tablet Commonly known as: CARDIZEM  Take 1/2 tablet by mouth every 8 hours.   entecavir  1 MG tablet Commonly known as: BARACLUDE  Take 1 tablet (1 mg total) by mouth daily.   ferrous sulfate  325 (65 FE) MG tablet Take 1 tablet (325 mg total) by mouth daily.   ketoconazole  2 % cream Commonly known as: NIZORAL  Apply 1 Application topically.   metFORMIN  500 MG tablet Commonly known as: GLUCOPHAGE  Take 2 tablets (1,000 mg total) by mouth 2 (two) times daily with a meal.   multivitamin with minerals Tabs tablet Take 1 tablet by mouth daily.   mycophenolate 500 MG tablet Commonly known as: CELLCEPT Take 500 mg by mouth 2 (two) times daily.   OneTouch Ultra test strip Generic drug: glucose blood USE TO TEST BLOOD SUGAR 3 TIMES DAILY   onetouch ultrasoft lancets USE AS INSTRUCTED TO CHECK BLOOD SUGARS THREE TIMES A DAY   pantoprazole  40 MG tablet Commonly known as: PROTONIX  TAKE 1 TABLET BY MOUTH 2 TIMES A DAY BEFORE A MEAL   predniSONE  10 MG tablet Commonly known as: DELTASONE  Take 10 mg by mouth daily with breakfast.   predniSONE  5 MG tablet Commonly known as: DELTASONE  Take by mouth 3 (three) times daily.   pyridostigmine  60 MG tablet Commonly known as: MESTINON  Take 1 tablet (60 mg total) by mouth 4 (four) times daily.   repaglinide  0.5 MG  tablet Commonly known as: PRANDIN  Take 1 tablet (0.5 mg total) by mouth daily before supper.   sitaGLIPtin  100 MG tablet Commonly known as: Januvia  Take 1 tablet (100 mg total) by mouth daily.   Vitamin D  50 MCG (2000 UT) tablet Take 2,000 Units by mouth daily.         OBJECTIVE:   Vital Signs: BP 126/80 (BP Location: Left Arm, Patient Position: Sitting, Cuff Size: Normal)   Pulse 98   Ht 5\' 5"  (1.651 m)   Wt 104 lb 12.8 oz (47.5 kg)   SpO2 98%   BMI 17.44 kg/m   Wt Readings from Last 3 Encounters:  06/12/23 104 lb 12.8 oz (47.5 kg)  03/26/23 97 lb 8 oz (44.2 kg)  03/04/23 100 lb 5 oz (45.5 kg)     Exam: General: Pt appears well and is in NAD No thyromegaly, no bruits  Lungs: Clear with good BS bilat   Heart: RRR   Extremities: No pretibial edema.   Neuro: MS is good with appropriate affect, pt is alert and Ox3   DM  FOOT EXAM 06/12/2023   The skin of the feet is intact without sores or ulcerations. The pedal pulses are 2+ on right and 2+ on left. The sensation is intact to a screening 5.07, 10 gram monofilament bilaterally   DATA REVIEWED:  Lab Results  Component Value Date   HGBA1C 5.4 06/12/2023   HGBA1C 5.9 (A) 02/12/2023   HGBA1C 5.8 (A) 08/09/2022    Latest Reference Range & Units 06/12/23 11:42  TSH 0.40 - 4.50 mIU/L 0.32 (L)  Triiodothyronine,Free,Serum 2.3 - 4.2 pg/mL 3.4  T4,Free(Direct) 0.8 - 1.8 ng/dL 1.3  (L): Data is abnormally low    Latest Reference Range & Units 03/08/23 04:57  Sodium 135 - 145 mmol/L 133 (L)  Potassium 3.5 - 5.1 mmol/L 3.7  Chloride 98 - 111 mmol/L 98  CO2 22 - 32 mmol/L 26  Glucose 70 - 99 mg/dL 78  BUN 8 - 23 mg/dL 18  Creatinine 1.91 - 4.78 mg/dL 2.95  Calcium  8.9 - 10.3 mg/dL 9.0  Anion gap 5 - 15  9  GFR, Estimated >60 mL/min >60  (L): Data is abnormally low  Latest Reference Range & Units 02/12/23 14:39  TSH 0.40 - 4.50 mIU/L 0.36 (L)  T4,Free(Direct) 0.8 - 1.8 ng/dL 1.2    Latest Reference Range &  Units 02/12/23 14:39  Microalb, Ur mg/dL 2.7  MICROALB/CREAT RATIO <30 mg/g creat 33 (H)  Creatinine, Urine 20 - 320 mg/dL 82     62/01/3084 TSH 5.78 uIU/mL FT4 0.85   Old records , labs and images have been reviewed.   ASSESSMENT / PLAN / RECOMMENDATIONS:   1) Type 2 Diabetes Mellitus, Optimally controlled, Without complications - Most recent A1c of 5.4 %. Goal A1c < 7.0 %.     -Patient on chronic prednisone  therapy due to myasthenia gravis -His A1c is skewed due to plasmapheresis/ IVIG  -In office BG has been elevated at 247 Mg/DL, patient states he ate a big breakfast today with desserts.  His home BG readings have been in the low 100s from his memory recall. -Due to dysphagia he has changed his biggest meal from supper to lunch, patient advised to take repaglinide  with lunch from now on instead of supper -Patient advised to bring glucose meter in the future -I have offered C GM technology, but the patient prefers to use a fingerstick - A prescription for lancing device and One Touch ultra Delica was sent to the pharmacy   MEDICATIONS: Continue metformin  500 mg 2 tabs twice daily Continue Januvia  100 mg daily Take repaglinide  0.5 mg, 1 tab before lunch  EDUCATION / INSTRUCTIONS: BG monitoring instructions: Patient is instructed to check his blood sugars 1 times a day, may alternate before breakfast, lunch, and supper. Call [place] [facility] if: BG persistently < 70  I reviewed the Rule of 15 for the treatment of hypoglycemia in detail with the patient. Literature supplied.    2) Diabetic complications:  Eye: Does not have known diabetic retinopathy.  Neuro/ Feet: Does not have known diabetic peripheral neuropathy .  Renal: Patient does not have known baseline CKD. He   is  on an ACEI/ARB at present.     3. Subclinical Hyperthyroidism:  -Patient is clinically euthyroid -TSH has increased from 0.31 u IU/mL in November, 2024 to 0.36 u IU/mL in 01/2023 -  Today's labs show stable TSH at 0.32 u IU/mL - TRAb pending - No intervention at this time unless his TSH 0.1 u IU/mL or if he becomes symptomatic  4.  Microalbuminuria:   -This is new -Patient is not on ACEi nor an ARB -We will continue to monitor as BP is borderline and if this persists or worsens we will consider treatment   F/U in 4 months   Signed electronically by: Natale Bail, MD  [facility] Endocrinology  [facility] [address] [place] [place], [place] [address] Phone: [phone] FAX: [phone]   CC: [place], [name], MD [address] [place] [address] Phone: [phone]  Fax: [phone]  Return to Endocrinology clinic as below: No future appointments.

## 2023-06-12 NOTE — Patient Instructions (Addendum)
 Continue Januvia  100 mg daily with Breakfast Continue Metformin  500 mg, 2 tablets with Breakfast and 2 tablet with supper Take repaglinide  0.5 mg, 1 tablet before Lunch   HOW TO TREAT LOW BLOOD SUGARS (Blood sugar LESS THAN 70 MG/DL) Please follow the RULE OF 15 for the treatment of hypoglycemia treatment (when your (blood sugars are less than 70 mg/dL)   STEP 1: Take 15 grams of carbohydrates when your blood sugar is low, which includes:  3-4 GLUCOSE TABS  OR 3-4 OZ OF JUICE OR REGULAR SODA OR ONE TUBE OF GLUCOSE GEL    STEP 2: RECHECK blood sugar in 15 MINUTES STEP 3: If your blood sugar is still low at the 15 minute recheck --> then, go back to STEP 1 and treat AGAIN with another 15 grams of carbohydrates.

## 2023-06-18 LAB — T4, FREE: Free T4: 1.3 ng/dL (ref 0.8–1.8)

## 2023-06-18 LAB — T3, FREE: T3, Free: 3.4 pg/mL (ref 2.3–4.2)

## 2023-06-18 LAB — TRAB (TSH RECEPTOR BINDING ANTIBODY): TRAB: <1 IU/L (ref <=2.00)

## 2023-06-18 LAB — TSH: TSH: 0.32 m[IU]/L — ABNORMAL LOW (ref 0.40–4.50)

## 2023-06-19 DIAGNOSIS — G7000 Myasthenia gravis without (acute) exacerbation: Secondary | ICD-10-CM | POA: Diagnosis not present

## 2023-06-19 DIAGNOSIS — Z5181 Encounter for therapeutic drug level monitoring: Secondary | ICD-10-CM | POA: Diagnosis not present

## 2023-07-03 DIAGNOSIS — G7000 Myasthenia gravis without (acute) exacerbation: Secondary | ICD-10-CM | POA: Diagnosis not present

## 2023-07-05 DIAGNOSIS — C37 Malignant neoplasm of thymus: Secondary | ICD-10-CM | POA: Diagnosis not present

## 2023-07-05 DIAGNOSIS — G7000 Myasthenia gravis without (acute) exacerbation: Secondary | ICD-10-CM | POA: Diagnosis not present

## 2023-07-31 DIAGNOSIS — G7000 Myasthenia gravis without (acute) exacerbation: Secondary | ICD-10-CM | POA: Diagnosis not present

## 2023-08-30 DIAGNOSIS — G7000 Myasthenia gravis without (acute) exacerbation: Secondary | ICD-10-CM | POA: Diagnosis not present

## 2023-09-28 DIAGNOSIS — G7000 Myasthenia gravis without (acute) exacerbation: Secondary | ICD-10-CM | POA: Diagnosis not present

## 2023-10-15 ENCOUNTER — Other Ambulatory Visit: Payer: Self-pay | Admitting: Internal Medicine

## 2023-10-15 ENCOUNTER — Ambulatory Visit: Admitting: Internal Medicine

## 2023-10-23 DIAGNOSIS — G7000 Myasthenia gravis without (acute) exacerbation: Secondary | ICD-10-CM | POA: Diagnosis not present

## 2023-10-23 DIAGNOSIS — Z5181 Encounter for therapeutic drug level monitoring: Secondary | ICD-10-CM | POA: Diagnosis not present

## 2023-10-29 DIAGNOSIS — G7000 Myasthenia gravis without (acute) exacerbation: Secondary | ICD-10-CM | POA: Diagnosis not present

## 2023-11-02 ENCOUNTER — Ambulatory Visit: Admitting: Family Medicine

## 2023-11-02 VITALS — BP 151/89 | Temp 97.8°F | Resp 16 | Ht 65.0 in | Wt 113.4 lb

## 2023-11-02 DIAGNOSIS — R000 Tachycardia, unspecified: Secondary | ICD-10-CM

## 2023-11-02 DIAGNOSIS — Z23 Encounter for immunization: Secondary | ICD-10-CM | POA: Diagnosis not present

## 2023-11-02 DIAGNOSIS — I10 Essential (primary) hypertension: Secondary | ICD-10-CM

## 2023-11-02 DIAGNOSIS — E1169 Type 2 diabetes mellitus with other specified complication: Secondary | ICD-10-CM

## 2023-11-02 LAB — POCT GLYCOSYLATED HEMOGLOBIN (HGB A1C): Hemoglobin A1C: 5.9 % — AB (ref 4.0–5.6)

## 2023-11-02 MED ORDER — DILTIAZEM HCL 90 MG PO TABS: 90.0000 mg | ORAL_TABLET | Freq: Three times a day (TID) | ORAL | 2 refills | Status: DC

## 2023-11-02 NOTE — Patient Instructions (Addendum)
 A few things to remember from today's visit:  Hypertension, essential, benign  Type 2 diabetes mellitus with other specified complication, without long-term current use of insulin  (HCC) - Plan: POC HgB A1c  Increase Diltiazem  increased to 1 tab 3 times per day. Let me know about blood pressures and heart rate in 3-4 weeks. If heart rate is under 60/min we will need to decrease diltiazem  to 1/2 tab again OR if blood pressure 100/60 or lower.  If you need refills for medications you take chronically, please call your pharmacy. Do not use My Chart to request refills or for acute issues that need immediate attention. If you send a my chart message, it may take a few days to be addressed, specially if I am not in the office.  Please be sure medication list is accurate. If a new problem present, please set up appointment sooner than planned today.

## 2023-11-02 NOTE — Progress Notes (Signed)
 Chief Complaint  Patient presents with   Hypertension   Discussed the use of AI scribe software for clinical note transcription with the patient, who gave verbal consent to proceed.  History of Present Illness [name] is a 68 year old male PMHx significant for HTN, DM II, sinus tachycardia, myasthenia gravis, PTSD, chronic hepatitis B, and HLD here today concerned about elevated BP.  His blood pressure has been slightly elevated, with systolic readings ranging from 132 to 152 mmHg and diastolic readings around 80 mmHg. Today, his blood pressure was particularly high at 170/90 mmHg. He is currently taking diltiazem  90 mg, one half tablet three times per day.  He experiences occasional headaches and a stiff neck.  No visual changes, chest pain, difficulty breathing, or palpitations.  His heart rate is generally under 100 bpm, though it occasionally reaches 105-110 bpm. He monitors his blood pressure and heart rate regularly, noting that his heart rate is usually around 98 bpm.  In regard to his myasthenia gravis, he has established with new neurologist and currently receiving monthly IVIG infusions, which he feels have been beneficial. He has not been in the ED since regimen was changed.  He is also taking prednisone  5 mg daily. He has been prescribed CellCept as a new medication, hoping that he can eventually wean off Prednisone .   Lab Results  Component Value Date   CREATININE 0.82 03/08/2023   BUN 18 03/08/2023   NA 133 (L) 03/08/2023   K 3.7 03/08/2023   CL 98 03/08/2023   CO2 26 03/08/2023   Review of Systems  Constitutional:  Negative for activity change, appetite change, chills and fever.  Respiratory:  Negative for cough and wheezing.   Gastrointestinal:  Negative for abdominal pain, nausea and vomiting.  Genitourinary:  Negative for decreased urine volume, dysuria and hematuria.  Neurological:  Negative for syncope and facial asymmetry.  Psychiatric/Behavioral:   Negative for confusion and hallucinations.   See other pertinent positives and negatives in HPI.  Current Outpatient Medications on File Prior to Visit  Medication Sig Dispense Refill   AMPICILLIN  PO Take 500 mg by mouth 2 (two) times daily as needed (rash).     Cholecalciferol  (VITAMIN D ) 2000 UNITS tablet Take 2,000 Units by mouth daily.     desonide (DESONATE) 0.05 % gel Apply 1 application topically 2 (two) times daily as needed.      entecavir  (BARACLUDE ) 1 MG tablet Take 1 tablet (1 mg total) by mouth daily. 30 tablet 6   glucose blood (ONETOUCH ULTRA) test strip USE TO TEST BLOOD SUGAR 3 TIMES DAILY 100 strip 11   ketoconazole  (NIZORAL ) 2 % cream Apply 1 Application topically.     Lancet Devices (ONE TOUCH DELICA LANCING DEV) MISC 1 Device by Does not apply route daily in the afternoon. 1 each 0   Lancets (ONETOUCH ULTRASOFT) lancets 1 each by Other route daily in the afternoon. USE AS INSTRUCTED TO CHECK BLOOD SUGARS THREE TIMES A DAY 100 each 3   metFORMIN  (GLUCOPHAGE ) 500 MG tablet Take 2 tablets (1,000 mg total) by mouth 2 (two) times daily with a meal. 360 tablet 3   Multiple Vitamin (MULTIVITAMIN WITH MINERALS) TABS tablet Take 1 tablet by mouth daily. 100 tablet 0   mycophenolate (CELLCEPT) 500 MG tablet Take 500 mg by mouth 2 (two) times daily.     pantoprazole  (PROTONIX ) 40 MG tablet TAKE 1 TABLET BY MOUTH 2 TIMES A DAY BEFORE A MEAL 60 tablet 5  predniSONE  (DELTASONE ) 10 MG tablet Take 10 mg by mouth daily with breakfast.     predniSONE  (DELTASONE ) 5 MG tablet Take by mouth 3 (three) times daily.     pyridostigmine  (MESTINON ) 60 MG tablet Take 1 tablet (60 mg total) by mouth 4 (four) times daily.     repaglinide  (PRANDIN ) 0.5 MG tablet TAKE 1 TABLET BY MOUTH DAILY BEFORE SUPPER 90 tablet 0   sitaGLIPtin  (JANUVIA ) 100 MG tablet Take 1 tablet (100 mg total) by mouth daily. 90 tablet 3   ferrous sulfate  325 (65 FE) MG tablet Take 1 tablet (325 mg total) by mouth daily. (Patient  not taking: Reported on 11/02/2023) 90 tablet 2   No current facility-administered medications on file prior to visit.    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)    Spongiotic dermatitis    Type II or unspecified type diabetes mellitus without mention of complication, not stated as uncontrolled    Viral hepatitis B without mention of hepatic coma, chronic, without mention of hepatitis delta     Allergies  Allergen Reactions   Azithromycin Other (See Comments)    REACTION: exac of his mg   Beta Adrenergic Blockers Other (See Comments)    REACTION: exac of mg   Calcium  Channel Blockers Other (See Comments)    Medications that may worsen or trigger MG exacerbation: Class IA antiarrhythmics, magnesium , flouroquinolones, macrolides, aminoglycosides, penicillamine, curare, interferon alpha, botox, quinine. Use with caution: calcium  channel blockers, beta blockers and statins.    Macrolides And Ketolides Other (See Comments)    Medications that may worsen or trigger MG exacerbation: Class IA antiarrhythmics, magnesium , flouroquinolones, macrolides, aminoglycosides, penicillamine, curare, interferon alpha, botox, quinine. Use with caution: calcium  channel blockers, beta blockers and statins.    Statins Other (See Comments)    Medications that may worsen or trigger MG exacerbation: Class IA antiarrhythmics, magnesium , flouroquinolones, macrolides, aminoglycosides, penicillamine, curare, interferon alpha, botox, quinine. Use with caution: calcium  channel blockers, beta blockers and statins.     Social History    Socioeconomic History   Marital status: Married    Spouse name: Not on file   Number of children: Not on file   Years of education: Not on file   Highest education level: Not on file  Occupational History   Not on file  Tobacco Use   Smoking status: Never   Smokeless tobacco: Never  Substance and Sexual Activity   Alcohol  use: No   Drug use: No   Sexual activity: Not on file  Other Topics Concern   Not on file  Social History Narrative   Not on file   Social Drivers of Health   Financial Resource Strain: Low Risk  (04/11/2023)   Received from [facility]   Overall Financial Resource Strain (CARDIA)    Difficulty of Paying Living Expenses: Not hard at all  Recent Concern: Financial Resource Strain - Medium Risk (04/04/2023)   Received from [facility]   Overall Financial Resource Strain (CARDIA)    Difficulty of Paying Living Expenses: Somewhat hard  Food Insecurity: No  Food Insecurity (04/11/2023)   Received from [facility]   Hunger Vital Sign    Within the past 12 months, you worried that your food would run out before you got the money to buy more.: Never true    Within the past 12 months, the food you bought just didn't last and you didn't have money to get more.: Never true  Recent Concern: Food Insecurity - Food Insecurity Present (04/04/2023)   Received from [facility]   Hunger Vital Sign    Worried About Running Out of Food in the Last Year: Sometimes true    Ran Out of Food in the Last Year: Sometimes true  Transportation Needs: Unknown (04/04/2023)   Received from [facility] - Transportation    In the past 12 months, has lack of transportation kept you from medical appointments or from getting medications?: No    Lack of Transportation (Non-Medical): Not on file  Physical Activity: Not on file  Stress: Not on file  Social Connections: Moderately Integrated  (03/05/2023)   Social Connection and Isolation Panel    Frequency of Communication with Friends and Family: More than three times a week    Frequency of Social Gatherings with Friends and Family: More than three times a week    Attends Religious Services: More than 4 times per year    Active Member of Clubs or Organizations: No    Attends Banker Meetings: Never    Marital Status: Married   Today's Vitals   11/02/23 0834 11/02/23 0857  BP: (!) 170/90 (!) 151/89  Resp: 16   Temp: 97.8 F (36.6 C)   TempSrc: Oral   SpO2: 99%   Weight: 113 lb 6 oz (51.4 kg)   Height: 5' 5 (1.651 m)    Body mass index is 18.87 kg/m.  Physical Exam Vitals and nursing note reviewed.  Constitutional:      General: He is not in acute distress.    Appearance: He is well-developed.  HENT:     Head: Normocephalic and atraumatic.     Mouth/Throat:     Mouth: Mucous membranes are moist.  Eyes:     Conjunctiva/sclera: Conjunctivae normal.  Cardiovascular:     Rate and Rhythm: Normal rate and regular rhythm.     Pulses:          Posterior tibial pulses are 2+ on the right side and 2+ on the left side.     Heart sounds: No murmur heard. Pulmonary:     Effort: Pulmonary effort is normal. No respiratory distress.     Breath sounds: Normal breath sounds.  Abdominal:     Palpations: Abdomen is soft. There is no mass.     Tenderness: There is no abdominal tenderness.  Lymphadenopathy:     Cervical: No cervical adenopathy.  Skin:    General: Skin is warm.     Findings: No erythema or rash.  Neurological:     General: No focal deficit present.     Mental Status: He is alert and oriented to person, place, and time.     Gait: Gait normal.  Psychiatric:        Mood and Affect: Affect normal. Mood is anxious.    ASSESSMENT AND PLAN:  Vidur Mosley was seen today for hypertension.  Diagnoses and all orders for this visit:  Orders Placed This Encounter  Procedures   Flu vaccine HIGH  DOSE PF(Fluzone Trivalent)  POC HgB A1c   Hypertension, essential, benign Assessment & Plan: BP mildly elevated. We discussed possible complications of poorly controlled HTN. He was on Olmesartan  in the past and tolerated well, he prefers to increase Diltiazem  dose instead. Diltiazem  90 mg increased from 1/2 tab tid to 1 tab tid. Continue low salt diet. He will let me know about BPs' in 3-4 weeks. Instructed about warning signs.  Orders: -     dilTIAZem  HCl; Take 1 tablet (90 mg total) by mouth 3 (three) times daily.  Dispense: 270 tablet; Refill: 2  Sinus tachycardia Assessment & Plan: It has improved, HR's at home mostly in the high 90's. Diltiazem  dose increased from 90 mg 1/2 tab tid to 1 tab tid. Continue monitoring HR regularly.  Orders: -     dilTIAZem  HCl; Take 1 tablet (90 mg total) by mouth 3 (three) times daily.  Dispense: 270 tablet; Refill: 2  Type 2 diabetes mellitus with other specified complication, without long-term current use of insulin  [facility]) Following with endocrinologist.  -     POCT glycosylated hemoglobin (Hb A1C)  Need for immunization against influenza -     Flu vaccine HIGH DOSE PF(Fluzone Trivalent)  I personally spent a total of 33 minutes in the care of the patient today including preparing to see the patient, getting/reviewing separately obtained history, performing a medically appropriate exam/evaluation, counseling and educating, and documenting clinical information in the EHR.  Return for keep next appointment.   Sanam Marmo [place], MD [facility] [place] office.

## 2023-11-03 ENCOUNTER — Encounter: Payer: Self-pay | Admitting: Family Medicine

## 2023-11-03 NOTE — Assessment & Plan Note (Signed)
 BP mildly elevated. We discussed possible complications of poorly controlled HTN. He was on Olmesartan  in the past and tolerated well, he prefers to increase Diltiazem  dose instead. Diltiazem  90 mg increased from 1/2 tab tid to 1 tab tid. Continue low salt diet. He will let me know about BPs' in 3-4 weeks. Instructed about warning signs.

## 2023-11-03 NOTE — Assessment & Plan Note (Signed)
 It has improved, HR's at home mostly in the high 90's. Diltiazem  dose increased from 90 mg 1/2 tab tid to 1 tab tid. Continue monitoring HR regularly.

## 2023-11-06 DIAGNOSIS — L219 Seborrheic dermatitis, unspecified: Secondary | ICD-10-CM | POA: Diagnosis not present

## 2023-11-16 ENCOUNTER — Other Ambulatory Visit

## 2023-11-16 ENCOUNTER — Ambulatory Visit: Admitting: Internal Medicine

## 2023-11-16 ENCOUNTER — Encounter: Payer: Self-pay | Admitting: Internal Medicine

## 2023-11-16 VITALS — BP 138/84 | HR 64 | Ht 65.0 in | Wt 115.0 lb

## 2023-11-16 DIAGNOSIS — Z7984 Long term (current) use of oral hypoglycemic drugs: Secondary | ICD-10-CM

## 2023-11-16 DIAGNOSIS — R809 Proteinuria, unspecified: Secondary | ICD-10-CM | POA: Diagnosis not present

## 2023-11-16 DIAGNOSIS — E0590 Thyrotoxicosis, unspecified without thyrotoxic crisis or storm: Secondary | ICD-10-CM | POA: Diagnosis not present

## 2023-11-16 DIAGNOSIS — E1129 Type 2 diabetes mellitus with other diabetic kidney complication: Secondary | ICD-10-CM

## 2023-11-16 MED ORDER — ONETOUCH ULTRA TEST VI STRP: 1.0000 | ORAL_STRIP | Freq: Every day | 12 refills | Status: DC

## 2023-11-16 MED ORDER — ONETOUCH ULTRA 2 W/DEVICE KIT: 1.0000 | PACK | Freq: Every day | 0 refills | Status: AC

## 2023-11-16 NOTE — Patient Instructions (Signed)
 Continue Januvia  100 mg daily with Breakfast Continue Metformin  500 mg, 2 tablets with Breakfast and 2 tablet with supper Take repaglinide  0.5 mg, 1 tablet before Lunch   HOW TO TREAT LOW BLOOD SUGARS (Blood sugar LESS THAN 70 MG/DL) Please follow the RULE OF 15 for the treatment of hypoglycemia treatment (when your (blood sugars are less than 70 mg/dL)   STEP 1: Take 15 grams of carbohydrates when your blood sugar is low, which includes:  3-4 GLUCOSE TABS  OR 3-4 OZ OF JUICE OR REGULAR SODA OR ONE TUBE OF GLUCOSE GEL    STEP 2: RECHECK blood sugar in 15 MINUTES STEP 3: If your blood sugar is still low at the 15 minute recheck --> then, go back to STEP 1 and treat AGAIN with another 15 grams of carbohydrates.

## 2023-11-16 NOTE — Progress Notes (Unsigned)
 Name: [name]  Age/ Sex: 68 y.o., male   MRN/ DOB: [id number], 06-Jun-1955     PCP: [place], [name], MD   Reason for Endocrinology Evaluation: Type 2 Diabetes Mellitus  Initial Endocrine Consultative Visit: 03/06/2012    PATIENT IDENTIFIER: [name] is a 68 y.o. male with a past medical history of DM, HTN , Myasthenia Gravis and Hep B chronic infection the patient has followed with Endocrinology clinic since 03/06/2012 for consultative assistance with management of his diabetes.  DIABETIC HISTORY:  Mr. Lenoir was diagnosed with DM 2005. His hemoglobin A1c has ranged from 5.8% in 2022, peaking at 6.5% in 2018.     He was followed by Dr, Kassie from 2014 until 05/02/2021  SUBJECTIVE:   During the last visit (06/12/2023): A1c 5.9%    Today (11/16/2023): Mr. [name] is here for follow-up on diabetes management.  He checks his blood sugars  1x daily. The patient has not had hypoglycemic episodes since the last clinic visit.   Patient follows with [facility] for myasthenia gravis, s/p thymectomy 2004 with malignant thymoma treated with radiation .  He continues to receive IVIG and CellCept, he is on prednisone  10 mg with a plan to taper off   Patient follows with [facility] for hepatitis B   Continues to have good appetite  No nausea or vomiting  No constipation or diarrhea  Palpitations only with exercising  No tremors   NO FH of thyroid  disease    HOME ENDOCRINE REGIMEN:  Januvia  100 mg daily Metformin  500 mg, 2 tabs twice daily Repaglinide  0.5 mg, 1 tablet before lunch Prednisone  10 mg daily per neurology    Statin: No ACE-I/ARB: Yes  METER DOWNLOAD SUMMARY: 9/18-10/17/2025 Fingerstick Blood Glucose Tests = 30 Average Number Tests/Day = 1 Overall Mean FS Glucose = 107 Standard Deviation = 43  BG Ranges: Low = 76 High = 252  BG Target % Results: % In target = 93 % Over target = 7 % Under target = 0  Hypoglycemic  Events/30 Days: BG < 50 = 0 Episodes of symptomatic severe hypoglycemia = 0   DIABETIC COMPLICATIONS: Microvascular complications:   Denies: CKD , retinopathy,neuropathy Last Eye Exam: Completed 12/06/2022  Macrovascular complications:   Denies: CAD, CVA, PVD   HISTORY:  Past Medical History:  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 He has never used smokeless tobacco. He reports that he does not drink alcohol  and does not use drugs. Family History: No family history on file.   HOME MEDICATIONS: Allergies as of 11/16/2023       Reactions   Azithromycin Other (See Comments)   REACTION: exac of his  mg   Beta Adrenergic Blockers Other (See Comments)   REACTION: exac of mg   Calcium  Channel Blockers Other (See Comments)   Medications that may worsen or trigger MG exacerbation: Class IA antiarrhythmics, magnesium , flouroquinolones, macrolides, aminoglycosides, penicillamine, curare, interferon alpha, botox, quinine. Use with caution: calcium  channel blockers, beta blockers and statins.    Macrolides And Ketolides Other (See Comments)   Medications that may worsen or trigger MG exacerbation: Class IA antiarrhythmics, magnesium ,  flouroquinolones, macrolides, aminoglycosides, penicillamine, curare, interferon alpha, botox, quinine. Use with caution: calcium  channel blockers, beta blockers and statins.    Statins Other (See Comments)   Medications that may worsen or trigger MG exacerbation: Class IA antiarrhythmics, magnesium , flouroquinolones, macrolides, aminoglycosides, penicillamine, curare, interferon alpha, botox, quinine. Use with caution: calcium  channel blockers, beta blockers and statins.         Medication List        Accurate as of November 16, 2023 11:39 AM. If you have any questions, ask your nurse or doctor.          AMPICILLIN  PO Take 500 mg by mouth 2 (two) times daily as needed (rash).   desonide 0.05 % gel Commonly known as: DESONATE Apply 1 application topically 2 (two) times daily as needed.   diltiazem  90 MG tablet Commonly known as: CARDIZEM  Take 1 tablet (90 mg total) by mouth 3 (three) times daily.   entecavir  1 MG tablet Commonly known as: BARACLUDE  Take 1 tablet (1 mg total) by mouth daily.   ferrous sulfate  325 (65 FE) MG tablet Take 1 tablet (325 mg total) by mouth daily.   ketoconazole  2 % cream Commonly known as: NIZORAL  Apply 1 Application topically.   metFORMIN  500 MG tablet Commonly known as: GLUCOPHAGE  Take 2 tablets (1,000 mg total) by mouth 2 (two) times daily with a meal.   multivitamin with minerals Tabs tablet Take 1 tablet by mouth daily.   mycophenolate 500 MG tablet Commonly known as: CELLCEPT Take 500 mg by mouth 2 (two) times daily.   ONE TOUCH DELICA LANCING DEV Misc 1 Device by Does not apply route daily in the afternoon.   OneTouch Ultra test strip Generic drug: glucose blood USE TO TEST BLOOD SUGAR 3 TIMES DAILY   onetouch ultrasoft lancets 1 each by Other route daily in the afternoon. USE AS INSTRUCTED TO CHECK BLOOD SUGARS THREE TIMES A DAY   pantoprazole  40 MG tablet Commonly known as: PROTONIX  TAKE 1 TABLET BY MOUTH 2 TIMES A DAY  BEFORE A MEAL   predniSONE  10 MG tablet Commonly known as: DELTASONE  Take 10 mg by mouth daily with breakfast.   predniSONE  5 MG tablet Commonly known as: DELTASONE  Take by mouth 3 (three) times daily.   pyridostigmine  60 MG tablet Commonly known as: MESTINON  Take 1 tablet (60 mg total) by mouth 4 (four) times daily.   repaglinide  0.5 MG tablet Commonly known as: PRANDIN  TAKE 1 TABLET BY MOUTH DAILY BEFORE SUPPER   sitaGLIPtin  100 MG tablet Commonly known as: Januvia  Take 1 tablet (100 mg total) by mouth daily.   Vitamin D  50 MCG (2000 UT) tablet Take 2,000 Units by mouth daily.         OBJECTIVE:   Vital Signs: BP 138/84 (BP Location: Left Arm, Patient Position: Sitting, Cuff Size: Normal)   Pulse 64   Ht 5' 5 (1.651 m)   Wt 115 lb (52.2 kg)   SpO2 96%   BMI 19.14 kg/m   Wt Readings from Last 3  Encounters:  11/16/23 115 lb (52.2 kg)  11/02/23 113 lb 6 oz (51.4 kg)  06/12/23 104 lb 12.8 oz (47.5 kg)     Exam: General: Pt appears well and is in NAD No thyromegaly, no bruits  Lungs: Clear with good BS bilat   Heart: RRR   Extremities: No pretibial edema.   Neuro: MS is good with appropriate affect, pt is alert and Ox3   DM FOOT EXAM 06/12/2023   The skin of the feet is intact without sores or ulcerations. The pedal pulses are 2+ on right and 2+ on left. The sensation is intact to a screening 5.07, 10 gram monofilament bilaterally   DATA REVIEWED:  Lab Results  Component Value Date   HGBA1C 5.9 (A) 11/02/2023   HGBA1C 5.4 06/12/2023   HGBA1C 5.9 (A) 02/12/2023   Outside labs 10/23/2023 Sodium 135 Potassium 3.8 BUN 10 Creatinine 1 Glucose 127 GFR 82   Latest Reference Range & Units 02/12/23 14:39  Microalb, Ur mg/dL 2.7  MICROALB/CREAT RATIO <30 mg/g creat 33 (H)  Creatinine, Urine 20 - 320 mg/dL 82    TRAB < 1 (4/86/7974)  Old records , labs and images have been reviewed.   ASSESSMENT / PLAN / RECOMMENDATIONS:   1) Type 2 Diabetes  Mellitus, Optimally controlled, With microalbuminuria complications - Most recent A1c of 5.9 %. Goal A1c < 7.0 %.     -Patient on chronic prednisone  therapy due to myasthenia gravis -His A1c is skewed due to Hx of plasmapheresis/ IVIG  -He prefers fingersticks rather than CGM technology, a new prescription for One Touch ultra and extra strips was sent to the pharmacy - He is currently on a smaller dose of prednisone , I did advise the patient to keep a close eye on glucose as there is a risk of hypoglycemia with repaglinide  and smaller doses of prednisone , I did advise the patient that once he is off the prednisone , most likely he will need to discontinue repaglinide  - No changes  MEDICATIONS: Continue metformin  500 mg 2 tabs twice daily Continue Januvia  100 mg daily Take repaglinide  0.5 mg, 1 tab before lunch  EDUCATION / INSTRUCTIONS: BG monitoring instructions: Patient is instructed to check his blood sugars 1 times a day, may alternate before breakfast, lunch, and supper. Call [facility] if: BG persistently < 70  I reviewed the Rule of 15 for the treatment of hypoglycemia in detail with the patient. Literature supplied.    2) Diabetic complications:  Eye: Does not have known diabetic retinopathy.  Neuro/ Feet: Does not have known diabetic peripheral neuropathy .  Renal: Patient does not have known baseline CKD. He   is  on an ACEI/ARB at present.     3. Subclinical Hyperthyroidism:  - Patient is clinically euthyroid - TRAb negative -   4.  Microalbuminuria:   -Patient is not on ACEi nor an ARB -   F/U in 4 months   Signed electronically by: Stefano Redgie Butts, MD  [facility]  [facility] [address] [place], [place] [address] Phone: [phone] FAX: [phone]   CC: [place], [name], MD [address] [place] [address] Phone: [phone]  Fax: [phone]  Return to Endocrinology  clinic as below: Future Appointments  Date Time Provider Department Center  03/25/2024  8:30 AM [place], [name], MD [facility] [address]

## 2023-11-17 LAB — MICROALBUMIN / CREATININE URINE RATIO
Creatinine, Urine: 31 mg/dL (ref 20–320)
Microalb Creat Ratio: 58 mg/g{creat} — ABNORMAL HIGH (ref <30)
Microalb, Ur: 1.8 mg/dL

## 2023-11-17 LAB — T4, FREE: Free T4: 1.2 ng/dL (ref 0.8–1.8)

## 2023-11-17 LAB — T3, FREE: T3, Free: 3.4 pg/mL (ref 2.3–4.2)

## 2023-11-17 LAB — TSH: TSH: 0.31 m[IU]/L — ABNORMAL LOW (ref 0.40–4.50)

## 2023-11-19 ENCOUNTER — Ambulatory Visit: Payer: Self-pay | Admitting: Internal Medicine

## 2023-11-19 MED ORDER — LISINOPRIL 2.5 MG PO TABS: 2.5000 mg | ORAL_TABLET | Freq: Every day | ORAL | 3 refills | Status: DC

## 2023-11-19 NOTE — Telephone Encounter (Signed)
 Please let the patient know that he continues to leak extra protein in the urine, unfortunately this is worse than it has been, this is most likely due to diabetes, I would recommend that he starts lisinopril 2.5 mg, 1 tablet daily, this medication is to protect his kidneys from the effects of diabetes    His thyroid  continues to be the same, no treatment necessary  Thanks

## 2023-11-19 NOTE — Telephone Encounter (Signed)
 Patient called.  He is returning a missed call. Chart reviewed.  I read Dr. Kris message to him.  He states that he will pick up his medication.  Leita Constable, RD, LDN, CDCES, DipACLM

## 2023-11-20 ENCOUNTER — Other Ambulatory Visit: Payer: Self-pay

## 2023-11-20 MED ORDER — ONETOUCH ULTRA TEST VI STRP: 1.0000 | ORAL_STRIP | Freq: Every day | 12 refills | Status: DC

## 2023-11-26 DIAGNOSIS — G7000 Myasthenia gravis without (acute) exacerbation: Secondary | ICD-10-CM | POA: Diagnosis not present

## 2023-12-01 ENCOUNTER — Other Ambulatory Visit: Payer: Self-pay | Admitting: Family Medicine

## 2023-12-01 DIAGNOSIS — K219 Gastro-esophageal reflux disease without esophagitis: Secondary | ICD-10-CM

## 2023-12-12 DIAGNOSIS — E119 Type 2 diabetes mellitus without complications: Secondary | ICD-10-CM | POA: Diagnosis not present

## 2023-12-18 ENCOUNTER — Telehealth: Payer: Self-pay | Admitting: Internal Medicine

## 2023-12-18 NOTE — Telephone Encounter (Signed)
 Patient left a message on the answering service stating a medication he was recently given for his kidney is giving him diarrhea and poor appetite.Please advise

## 2023-12-19 NOTE — Telephone Encounter (Signed)
 Left message for patient advising him to stop taking the lisinopril  like Dr Sam recommended.

## 2023-12-24 DIAGNOSIS — G7000 Myasthenia gravis without (acute) exacerbation: Secondary | ICD-10-CM | POA: Diagnosis not present

## 2024-01-11 ENCOUNTER — Other Ambulatory Visit: Payer: Self-pay | Admitting: Internal Medicine

## 2024-01-15 ENCOUNTER — Ambulatory Visit: Payer: Self-pay

## 2024-01-15 NOTE — Telephone Encounter (Signed)
°  FYI Only or Action Required?: FYI only for provider: appointment scheduled on 01/16/2024.  Patient was last seen in primary care on 11/02/2023 by Jordan, [name], MD.  Called Nurse Triage reporting Hypertension.  Symptoms began a week ago.  Interventions attempted: Nothing.  Symptoms are: gradually worsening.  Triage Disposition: See PCP When Office is Open (Within 3 Days)  Patient/caregiver understands and will follow disposition?: Yes   Copied from CRM #[id number]. Topic: Clinical - Red Word Triage >> Jan 15, 2024  9:23 AM Roselie BROCKS wrote: [facility] that prompted transfer to Nurse Triage: Patient states he was put on medication for high blood pressure but its not helping its still really high, he feels bad, and is having really bad diarrhea , Reason for Disposition  Systolic BP >= 160 OR Diastolic >= 100  Answer Assessment - Initial Assessment Questions 1. BLOOD PRESSURE: What is your blood pressure? Did you take at least two measurements 5 minutes apart?     145/87 98-HR 2. ONSET: When did you take your blood pressure?     yesterday 3. HOW: How did you take your blood pressure? (e.g., automatic home BP monitor, visiting nurse)     automatic 4. HISTORY: Do you have a history of high blood pressure?     yes 5. MEDICINES: Are you taking any medicines for blood pressure? Have you missed any doses recently?     no 6. OTHER SYMPTOMS: Do you have any symptoms? (e.g., blurred vision, chest pain, difficulty breathing, headache, weakness)     Stiff neck, diarrhea, sometime weakness 7. PREGNANCY: Is there any chance you are pregnant? When was your last menstrual period?     Na  December 03, 2023 BP medication was increased.    BP readings: 147/893, 129/78, 151/88, 137/87, 157/83 - pt concerned due to BP still running high at times even after BP medication adjustment.  Pt also having diarrhea every few days: appointment scheduled.  Protocols used: Blood Pressure -  High-A-AH

## 2024-01-16 ENCOUNTER — Ambulatory Visit: Admitting: Family Medicine

## 2024-01-16 ENCOUNTER — Encounter: Payer: Self-pay | Admitting: Family Medicine

## 2024-01-16 VITALS — BP 138/70 | HR 103 | Temp 98.8°F | Resp 16 | Ht 65.0 in | Wt 115.0 lb

## 2024-01-16 DIAGNOSIS — R197 Diarrhea, unspecified: Secondary | ICD-10-CM

## 2024-01-16 DIAGNOSIS — R000 Tachycardia, unspecified: Secondary | ICD-10-CM

## 2024-01-16 DIAGNOSIS — I10 Essential (primary) hypertension: Secondary | ICD-10-CM | POA: Diagnosis not present

## 2024-01-16 MED ORDER — DILTIAZEM HCL 90 MG PO TABS: 45.0000 mg | ORAL_TABLET | Freq: Three times a day (TID) | ORAL | Status: AC

## 2024-01-16 MED ORDER — OLMESARTAN MEDOXOMIL 20 MG PO TABS: 20.0000 mg | ORAL_TABLET | Freq: Every day | ORAL | 1 refills | Status: AC

## 2024-01-16 NOTE — Patient Instructions (Addendum)
 A few things to remember from today's visit:  Hypertension, essential, benign - Plan: diltiazem  (CARDIZEM ) 90 MG tablet  Sinus tachycardia - Plan: diltiazem  (CARDIZEM ) 90 MG tablet Decrease diltiazem  back to 1/2 tab 3 times daily and add Olmesartan  20 mg daily. Continue monitoring blood pressure regularly. If diarrhea continues we may need a gastroenterologist consultation and colonoscopy.  If you need refills for medications you take chronically, please call your pharmacy. Do not use My Chart to request refills or for acute issues that need immediate attention. If you send a my chart message, it may take a few days to be addressed, specially if I am not in the office.  Please be sure medication list is accurate. If a new problem present, please set up appointment sooner than planned today.

## 2024-01-16 NOTE — Progress Notes (Unsigned)
 Chief Complaint  Patient presents with   Hypertension    And Diarrhea. States it is ever 2-3 days it just depends. Last time he had diarrhea was Monday.    [name] is a 68 y.o. male with a PMHx significant for HTN, DM II, sinus tachycardia, myasthenia gravis, PTSD, chronic hepatitis B, and HLD who is here today with above complaints.  Last seen on 11/02/23. Hypertension currently on diltiazem  90 mg 3 times daily.  Recently lisinopril  2.5 mg was added, he is no longer taking it***  Negative for unusual or severe headache, visual changes, exertional chest pain, dyspnea,  focal weakness, or edema. Discussed the use of AI scribe software for clinical note transcription with the patient, who gave verbal consent to proceed.  History of Present Illness [name] is a 68 year old male with hypertension and myasthenia gravis who presents with gastrointestinal symptoms and blood pressure management issues.  He has been experiencing gastrointestinal symptoms, including diarrhea and loss of appetite, which he associates with the initiation of lisinopril . He took lisinopril  2.5 mg for about four weeks but stopped due to these symptoms. The diarrhea occurs two to three times daily, sometimes watery, but there is no abdominal pain or blood in the stool. Symptoms worsened with lisinopril  and slightly improved after discontinuation, though not completely resolved.  He has a history of hypertension and has been on diltiazem , which was increased in October. He notes a change in bowel habits after the increase in diltiazem , with looser stools. He previously took olmesartan  without issues. His blood pressure readings have been variable, with some readings in the 140s and others in the 120s.  He has myasthenia gravis and receives monthly IVIG infusions, which help for about three weeks before symptoms like neck stiffness and weakness return. He experiences a temporary increase in blood pressure  following the infusions. He also reports a mild allergic reaction to the infusions, with hives that have decreased in intensity over time.  He mentions having hemorrhoids that protrude with walking but retract when sitting or lying down. He has not had a recent colonoscopy. He also notes a change in appetite, feeling unable to eat the same food multiple times in a row, though he denies nausea.  He has a history of hepatitis B and is under the care of an infectious disease specialist. He also has diabetes, with a recent A1c of 5.9, and is mindful of his sugar intake. He is on mycophenolate and prednisone  for myasthenia gravis. He avoids dental procedures due to his long-term prednisone  use.   10/23/23 Component Ref Range & Units 2 mo ago  Sodium 135 - 145 mmol/L 135  Potassium 3.5 - 5.0 mmol/L 3.8  Chloride 98 - 108 mmol/L 95 Low   Carbon Dioxide (CO2) 21 - 30 mmol/L 27  Urea  Nitrogen (BUN) 7 - 20 mg/dL 10  Creatinine 0.6 - 1.3 mg/dL 1.0  Glucose 70 - 859 mg/dL 872  Comment: Interpretive Data: Above is the NONFASTING reference range.  Below are the FASTING reference ranges: NORMAL:      70-99 mg/dL PREDIABETES: 899-874 mg/dL DIABETES:    > 874 mg/dL  Calcium  8.7 - 10.2 mg/dL 9.9  AST (Aspartate Aminotransferase) 15 - 41 U/L 21  ALT (Alanine Aminotransferase) 15 - 50 U/L 13 Low   Bilirubin, Total 0.4 - 1.5 mg/dL 0.7  Alk Phos (Alkaline Phosphatase) 24 - 110 U/L 57  Albumin 3.5 - 4.8 g/dL 4.3  Protein, Total 6.2 -  8.1 g/dL 7.7  Anion Gap 3 - 12 mmol/L 13 High   BUN/CREA Ratio 6 - 27 10  Glomerular Filtration Rate (eGFR)     *** Review of Systems See other pertinent positives and negatives in HPI.  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)    Spongiotic dermatitis    Type II or unspecified type diabetes mellitus without mention of complication, not stated as uncontrolled    Viral hepatitis B without mention of hepatic coma, chronic, without mention of hepatitis delta    Allergies[2]  Social History   Socioeconomic History   Marital status: Married    Spouse name: Not on file   Number of children: Not on file   Years of education: Not on file   Highest education level: Not on file  Occupational History   Not on file  Tobacco Use   Smoking status: Never   Smokeless tobacco: Never  Substance and Sexual Activity   Alcohol  use: No   Drug use: No   Sexual activity: Not on file  Other Topics Concern   Not on file  Social History Narrative   Not on file   Social Drivers of Health   Tobacco Use: Low Risk (01/16/2024)   Patient History    Smoking Tobacco Use: Never    Smokeless Tobacco Use: Never    Passive Exposure: Not on file  Financial Resource Strain: Low Risk  (04/11/2023)   Received from [facility]   Overall Financial Resource Strain (CARDIA)    Difficulty of Paying Living Expenses: Not hard at all  Recent Concern: Financial Resource Strain - Medium Risk (04/04/2023)   Received from [facility]   Overall Financial Resource Strain (CARDIA)    Difficulty of Paying Living Expenses: Somewhat hard  Food Insecurity: No Food Insecurity (04/11/2023)   Received from [facility]   Epic    Within the past 12 months, you worried that your food would run out before you got the money to buy more.: Never true    Within the past 12 months, the food you bought just didn't last and you didn't have money to get more.: Never true  Recent Concern: Food Insecurity - Food  Insecurity Present (04/04/2023)   Received from [facility]   Hunger Vital Sign    Worried About Running Out of Food in the Last Year: Sometimes true    Ran Out of Food in the Last Year: Sometimes true  Transportation Needs: Unknown (04/04/2023)   Received from [facility] - Transportation    In the past 12 months, has lack of transportation kept you from medical appointments or from getting medications?: No    Lack of Transportation (Non-Medical): Not on file  Physical Activity: Not on file  Stress: Not on file  Social Connections: Moderately Integrated (03/05/2023)   Social Connection and Isolation Panel    Frequency of Communication with Friends and Family: More than three times a week    Frequency of Social  Gatherings with Friends and Family: More than three times a week    Attends Religious Services: More than 4 times per year    Active Member of Clubs or Organizations: No    Attends Banker Meetings: Never    Marital Status: Married  Depression (PHQ2-9): Medium Risk (11/03/2023)   Depression (PHQ2-9)    PHQ-2 Score: 6  Alcohol  Screen: Not on file  Housing: High Risk (04/04/2023)   Received from [facility]   Epic    In the last 12 months, was there a time when you were not able to pay the mortgage or rent on time?: Yes    In the past 12 months, how many times have you moved where you were living?: 0    At any time in the past 12 months, were you homeless or living in a shelter (including now)?: No  Utilities: At Risk (04/04/2023)   Received from [facility] Utilities    Threatened with loss of utilities: Yes  Health Literacy: Not on file    Vitals:   01/16/24 1007  BP: 138/70  Pulse: (!) 103  Temp: 98.8 F (37.1 C)  SpO2: 97%   Body mass index is 19.14 kg/m.  Physical Exam Vitals and nursing note reviewed.  Constitutional:      General: He is not in acute distress.     Appearance: He is well-developed.  HENT:     Head: Normocephalic and atraumatic.     Mouth/Throat:     Mouth: Mucous membranes are moist.     Pharynx: Oropharynx is clear.  Eyes:     Conjunctiva/sclera: Conjunctivae normal.  Cardiovascular:     Rate and Rhythm: Normal rate and regular rhythm.     Pulses:          Posterior tibial pulses are 2+ on the right side and 2+ on the left side.     Heart sounds: No murmur heard. Pulmonary:     Effort: Pulmonary effort is normal. No respiratory distress.     Breath sounds: Normal breath sounds.  Abdominal:     Palpations: Abdomen is soft. There is no mass.     Tenderness: There is no abdominal tenderness.  Musculoskeletal:     Right lower leg: No edema.     Left lower leg: No edema.  Skin:    General: Skin is warm.     Findings: No erythema or rash.  Neurological:     General: No focal deficit present.     Mental Status: He is alert and oriented to person, place, and time.     Gait: Gait normal.  Psychiatric:        Mood and Affect: Affect normal. Mood is anxious.    ASSESSMENT AND PLAN:  Darrell was seen today for hypertension.  Diagnoses and all orders for this visit:  Hypertension, essential, benign    No orders of the defined types were placed in this encounter.   No problem-specific Assessment & Plan notes found for this encounter.   No follow-ups on file.  Matisha Termine G. Leslye Puccini, MD  [facility]. [place] office.     [1]  Current Outpatient Medications on File Prior to Visit  Medication Sig Dispense Refill   AMPICILLIN  PO Take 500 mg by mouth 2 (two) times daily as needed (rash).     Blood Glucose Monitoring Suppl (ONE TOUCH ULTRA 2) w/Device KIT 1 Device by Does not apply route daily  in the afternoon. 1 kit 0   Cholecalciferol  (VITAMIN D ) 2000 UNITS tablet Take 2,000 Units by mouth daily.     desonide (DESONATE) 0.05 % gel Apply 1 application topically 2 (two) times daily as needed.      diltiazem   (CARDIZEM ) 90 MG tablet Take 1 tablet (90 mg total) by mouth 3 (three) times daily. 270 tablet 2   entecavir  (BARACLUDE ) 1 MG tablet Take 1 tablet (1 mg total) by mouth daily. 30 tablet 6   glucose blood (ONETOUCH ULTRA) test strip USE TO CHECK BLOOD SUGAR LEVELS THREE TIMES A DAY 100 strip 12   ketoconazole  (NIZORAL ) 2 % cream Apply 1 Application topically.     Lancet Devices (ONE TOUCH DELICA LANCING DEV) MISC 1 Device by Does not apply route daily in the afternoon. 1 each 0   metFORMIN  (GLUCOPHAGE ) 500 MG tablet Take 2 tablets (1,000 mg total) by mouth 2 (two) times daily with a meal. 360 tablet 3   Multiple Vitamin (MULTIVITAMIN WITH MINERALS) TABS tablet Take 1 tablet by mouth daily. 100 tablet 0   mycophenolate (CELLCEPT) 500 MG tablet Take 500 mg by mouth 2 (two) times daily.     OneTouch UltraSoft 2 Lancets MISC USE AS DIRECTED TO CHECK BLOOD SUGAR LEVELS THREE TIMES A DAY 200 each 12   pantoprazole  (PROTONIX ) 40 MG tablet TAKE 1 TABLET BY MOUTH 2 TIMES A DAY BEFORE A MEAL 60 tablet 5   predniSONE  (DELTASONE ) 10 MG tablet Take 10 mg by mouth daily with breakfast.     predniSONE  (DELTASONE ) 5 MG tablet Take by mouth 3 (three) times daily.     pyridostigmine  (MESTINON ) 60 MG tablet Take 1 tablet (60 mg total) by mouth 4 (four) times daily.     repaglinide  (PRANDIN ) 0.5 MG tablet TAKE 1 TABLET BY MOUTH DAILY BEFORE SUPPER 90 tablet 0   sitaGLIPtin  (JANUVIA ) 100 MG tablet Take 1 tablet (100 mg total) by mouth daily. 90 tablet 3   ferrous sulfate  325 (65 FE) MG tablet Take 1 tablet (325 mg total) by mouth daily. (Patient not taking: Reported on 01/16/2024) 90 tablet 2   No current facility-administered medications on file prior to visit.  [2]  Allergies Allergen Reactions   Azithromycin Other (See Comments)    REACTION: exac of his mg   Beta Adrenergic Blockers Other (See Comments)    REACTION: exac of mg   Calcium  Channel Blockers Other (See Comments)    Medications that may worsen or  trigger MG exacerbation: Class IA antiarrhythmics, magnesium , flouroquinolones, macrolides, aminoglycosides, penicillamine, curare, interferon alpha, botox, quinine. Use with caution: calcium  channel blockers, beta blockers and statins.    Macrolides And Ketolides Other (See Comments)    Medications that may worsen or trigger MG exacerbation: Class IA antiarrhythmics, magnesium , flouroquinolones, macrolides, aminoglycosides, penicillamine, curare, interferon alpha, botox, quinine. Use with caution: calcium  channel blockers, beta blockers and statins.    Statins Other (See Comments)    Medications that may worsen or trigger MG exacerbation: Class IA antiarrhythmics, magnesium , flouroquinolones, macrolides, aminoglycosides, penicillamine, curare, interferon alpha, botox, quinine. Use with caution: calcium  channel blockers, beta blockers and statins.

## 2024-01-19 NOTE — Assessment & Plan Note (Signed)
 BP has been mildly elevated. Due to his hx of myasthenia Gravis we have to be cautious with some antihypertensive medications. Diltiazem  dose increased seem to have caused diarrhea, so he is going to decrease dose from 90 mg to 45 mg tid. He has been on Olmesartan  in the past and he tolerated well, he agrees with resuming it at 20 mg daily. Continue low salt diet. Continue monitoring BP regularly.

## 2024-01-19 NOTE — Assessment & Plan Note (Addendum)
 Asymptomatic. HR has been < 100/min on Diltiazem , which we decreased today because of diarrhea. Continue monitoring HR and adequate hydration.

## 2024-02-04 ENCOUNTER — Ambulatory Visit: Payer: Self-pay | Admitting: Family Medicine

## 2024-02-04 NOTE — Telephone Encounter (Signed)
 First attempt to reach patient. LVM with office number.   Copied from CRM [id number]. Topic: Clinical - Prescription Issue >> Feb 04, 2024  9:35 AM Darshell M wrote: Reason for CRM: Patient new BP medicine. Believes it is causing tiredness and weakness and overall not feeling well. Requesting to reduce dosage. Patient taking olmesartan  and diltiazem . CB# [phone]

## 2024-02-04 NOTE — Telephone Encounter (Signed)
 Patient called back in and did not want to hold, requested call back, 2nd attempt, left message.

## 2024-02-04 NOTE — Telephone Encounter (Signed)
 Yes, he can split tab and take 1/2 tab daily. Continue monitoring BP regularly. Thanks, BJ

## 2024-02-04 NOTE — Telephone Encounter (Signed)
 FYI Only or Action Required?: Action required by provider: Pt declines appt. Just wants to know if olmesartan  can be reduced from 20 mg down to 10 mg to see if that makes a difference in feelings of generalized weakness. If possible, please call pt to notify. Preferred pharmacy:    [facility] [id number] - [place], [place] - [address] .    Patient was last seen in primary care on 01/16/2024 by Jordan, Matthew G, MD.  Called Nurse Triage reporting Medication Problem and Fatigue.  Symptoms began a week ago.  Interventions attempted: Prescription medications: Olmesartan .  Symptoms are: gradually worsening.  Triage Disposition: See PCP When Office is Open (Within 3 Days)  Patient/caregiver understands and will follow disposition?: No, wishes to speak with PCP    New rx for olmesartan  on 12/17 after visit on 12/17. 1 week after starting the medication started having mild generalized weakness and feeling tired. Has hx of myasthenia gravis but symptoms feel different. No CP or SOB or fever or dizziness. Has DM2, BG's have been normal, 89 in the morning, 125 after eating. BP has been running 105/76 and 110/80 recently. HR 86-102.  Advised appt in next 3 days. Pt declines, just wanting to know if PCP can reduce dose from 20 mg down to 10 mg to see if that makes a difference at this time. Forwarding request to office. Advised to call back for worsening symptoms.      Copied from CRM [id number]. Topic: Clinical - Prescription Issue >> Feb 04, 2024  9:35 AM Darshell M wrote: Reason for CRM: Patient new BP medicine. Believes it is causing tiredness and weakness and overall not feeling well. Requesting to reduce dosage. Patient taking olmesartan  and diltiazem . CB# [phone] Reason for Disposition  [1] MILD weakness (e.Sandoval., does not interfere with ability to work, go to school, normal activities) AND [2] persists > 1 week  Answer Assessment - Initial Assessment Questions 1. DESCRIPTION:  Describe how you are feeling.     Generalized weakness/fatigue  2. SEVERITY: How bad is it?  Can you stand and walk?     Mild   3. ONSET: When did these symptoms begin? (e.Sandoval., hours, days, weeks, months)     12/24, a week after starting olmesartan   4. CAUSE: What do you think is causing the weakness or fatigue? (e.Sandoval., not drinking enough fluids, medical problem, trouble sleeping)     New rx for olmesartan  starting 12/17  5. NEW MEDICINES:  Have you started on any new medicines recently? (e.Sandoval., opioid pain medicines, benzodiazepines, muscle relaxants, antidepressants, antihistamines, neuroleptics, beta blockers)     Olmesartan   6. OTHER SYMPTOMS: Do you have any other symptoms? (e.Sandoval., chest pain, fever, cough, SOB, vomiting, diarrhea, bleeding, other areas of pain)     Denies  Protocols used: Weakness (Generalized) and Fatigue-A-AH

## 2024-02-04 NOTE — Telephone Encounter (Signed)
 Patient informed and voiced understanding

## 2024-03-25 ENCOUNTER — Ambulatory Visit: Admitting: Family Medicine

## 2024-05-16 ENCOUNTER — Ambulatory Visit: Admitting: Internal Medicine
